# Patient Record
Sex: Female | Born: 1955
Health system: Southern US, Community
[De-identification: ages and names within clinical notes are randomized; demographics above are authoritative.]

## PROBLEM LIST (undated history)

## (undated) DIAGNOSIS — C801 Malignant (primary) neoplasm, unspecified: Secondary | ICD-10-CM

## (undated) DIAGNOSIS — K219 Gastro-esophageal reflux disease without esophagitis: Secondary | ICD-10-CM

## (undated) DIAGNOSIS — I509 Heart failure, unspecified: Secondary | ICD-10-CM

## (undated) DIAGNOSIS — I739 Peripheral vascular disease, unspecified: Secondary | ICD-10-CM

## (undated) DIAGNOSIS — G5600 Carpal tunnel syndrome, unspecified upper limb: Secondary | ICD-10-CM

## (undated) DIAGNOSIS — R011 Cardiac murmur, unspecified: Secondary | ICD-10-CM

## (undated) DIAGNOSIS — C9000 Multiple myeloma not having achieved remission: Secondary | ICD-10-CM

## (undated) DIAGNOSIS — I10 Essential (primary) hypertension: Secondary | ICD-10-CM

## (undated) DIAGNOSIS — M1990 Unspecified osteoarthritis, unspecified site: Secondary | ICD-10-CM

## (undated) DIAGNOSIS — G4730 Sleep apnea, unspecified: Secondary | ICD-10-CM

## (undated) DIAGNOSIS — G629 Polyneuropathy, unspecified: Secondary | ICD-10-CM

## (undated) HISTORY — PX: CERVICAL CONE BIOPSY: SUR198

## (undated) HISTORY — DX: Essential (primary) hypertension: I10

## (undated) HISTORY — PX: ROTATOR CUFF REPAIR: SHX139

## (undated) HISTORY — DX: Multiple myeloma not having achieved remission: C90.00

## (undated) HISTORY — DX: Carpal tunnel syndrome, unspecified upper limb: G56.00

## (undated) HISTORY — PX: OTHER SURGICAL HISTORY: SHX169

## (undated) HISTORY — PX: TUBAL LIGATION: SHX77

---

## 2008-02-25 ENCOUNTER — Encounter: Payer: Self-pay | Admitting: Cardiology

## 2009-04-20 ENCOUNTER — Encounter: Payer: Self-pay | Admitting: Cardiology

## 2009-07-20 ENCOUNTER — Encounter: Payer: Self-pay | Admitting: Cardiology

## 2009-07-25 ENCOUNTER — Encounter: Payer: Self-pay | Admitting: Cardiology

## 2009-07-25 ENCOUNTER — Ambulatory Visit: Payer: Self-pay | Admitting: Cardiology

## 2009-07-27 ENCOUNTER — Encounter (INDEPENDENT_AMBULATORY_CARE_PROVIDER_SITE_OTHER): Payer: Self-pay | Admitting: *Deleted

## 2009-07-27 ENCOUNTER — Ambulatory Visit: Payer: Self-pay | Admitting: Cardiology

## 2009-07-27 DIAGNOSIS — E1165 Type 2 diabetes mellitus with hyperglycemia: Secondary | ICD-10-CM

## 2009-07-27 DIAGNOSIS — E118 Type 2 diabetes mellitus with unspecified complications: Secondary | ICD-10-CM

## 2009-07-27 DIAGNOSIS — R9430 Abnormal result of cardiovascular function study, unspecified: Secondary | ICD-10-CM | POA: Insufficient documentation

## 2009-07-27 DIAGNOSIS — F17200 Nicotine dependence, unspecified, uncomplicated: Secondary | ICD-10-CM

## 2009-07-27 DIAGNOSIS — I10 Essential (primary) hypertension: Secondary | ICD-10-CM

## 2009-07-28 ENCOUNTER — Encounter: Payer: Self-pay | Admitting: Cardiology

## 2009-07-31 ENCOUNTER — Encounter: Payer: Self-pay | Admitting: Cardiology

## 2009-08-02 ENCOUNTER — Encounter: Payer: Self-pay | Admitting: Cardiology

## 2009-08-03 ENCOUNTER — Ambulatory Visit: Payer: Self-pay | Admitting: Cardiovascular Disease

## 2009-08-03 ENCOUNTER — Inpatient Hospital Stay (HOSPITAL_BASED_OUTPATIENT_CLINIC_OR_DEPARTMENT_OTHER): Admission: RE | Admit: 2009-08-03 | Discharge: 2009-08-03 | Payer: Self-pay | Admitting: Cardiovascular Disease

## 2009-08-15 ENCOUNTER — Ambulatory Visit: Payer: Self-pay | Admitting: Cardiology

## 2009-08-15 DIAGNOSIS — R0602 Shortness of breath: Secondary | ICD-10-CM

## 2009-08-15 DIAGNOSIS — I119 Hypertensive heart disease without heart failure: Secondary | ICD-10-CM | POA: Insufficient documentation

## 2009-08-15 DIAGNOSIS — G4733 Obstructive sleep apnea (adult) (pediatric): Secondary | ICD-10-CM

## 2009-08-18 ENCOUNTER — Encounter: Payer: Self-pay | Admitting: Cardiology

## 2009-08-22 ENCOUNTER — Encounter: Payer: Self-pay | Admitting: Cardiology

## 2009-08-23 ENCOUNTER — Ambulatory Visit: Payer: Self-pay | Admitting: Cardiology

## 2009-08-25 ENCOUNTER — Encounter: Payer: Self-pay | Admitting: Cardiology

## 2009-08-29 ENCOUNTER — Encounter: Payer: Self-pay | Admitting: Cardiology

## 2009-09-01 ENCOUNTER — Encounter: Payer: Self-pay | Admitting: Cardiology

## 2009-09-05 ENCOUNTER — Encounter: Payer: Self-pay | Admitting: Cardiology

## 2009-09-13 ENCOUNTER — Ambulatory Visit: Payer: Self-pay | Admitting: Cardiology

## 2009-09-25 ENCOUNTER — Encounter: Payer: Self-pay | Admitting: Cardiology

## 2009-12-07 ENCOUNTER — Encounter: Payer: Self-pay | Admitting: Cardiology

## 2010-03-19 ENCOUNTER — Ambulatory Visit: Payer: Self-pay | Admitting: Cardiology

## 2010-03-22 ENCOUNTER — Encounter: Payer: Self-pay | Admitting: Cardiology

## 2010-07-17 NOTE — Medication Information (Signed)
Summary: RX Folder/ ATENOLOL & LABETALOL  RX Folder/ ATENOLOL & LABETALOL   Imported By: Dorise Hiss 07/31/2009 12:33:41  _____________________________________________________________________  External Attachment:    Type:   Image     Comment:   External Document

## 2010-07-17 NOTE — Letter (Signed)
Summary: Cardiac Cath Instructions - [facility]  [facility] at [facility] [address]. [address]   [place], [place] [address]   Phone: [phone]  Fax: [phone]     07/27/2009 MRN: [id number]  [facility] [place] [address] [place], [place]  [address]  Dear Ms. Farooqui,   You are scheduled for a Cardiac Catheterization on Thursday, August 03, 2009 at 9:30 with Dr. Excell Seltzer.  Please arrive to the 1st floor of the Heart and Vascular Center at [facility] at 8:30am / pm on the day of your procedure. Please do not arrive before 6:30 a.m. Call the Heart and Vascular Center at [phone] if you are unable to make your appointmnet. The Code to get into the parking garage under the building is 0900. Take the elevators to the 1st floor. You must have someone to drive you home. Someone must be with you for the first 24 hours after you arrive home. Please wear clothes that are easy to get on and off and wear slip-on shoes. Do not eat or drink after midnight except water with your medications that morning. Bring all your medications and current insurance cards with you.  _X__ DO NOT take these medications before your procedure:  Humalog, Metformin - hold 24 hours before and 48 hours after cath, and HCTZ  ___ Make sure you take your aspirin.  ___ You may take ALL of your medications with water that morning.  ___ DO NOT take ANY medications before your procedure.  ___ Pre-med instructions:  ________________________________________________________________________  The usual length of stay after your procedure is 2 to 3 hours. This can vary.  If you have any questions, please call the office at the number listed above.  Hoover Brunette, LPN               Directions to the [facility]  Please Note : Park in [place] under the building not the parking deck.  From [employer]: Turn onto [address] Left onto [place] (1st  stoplight) Right at the brick entrance to the hospital ([address]) Bear to the right and you will see a blue sign "[facility]" Parking garage is a sharp right'to get through the gate out in the code _______. Once you park, take the elevator to the first floor. Please do not arrive before 0630am. The building will be dark before that time.   From [address] Turn onto [address] Turn left into the brick entrance to the hospital (Main circle [address]) Bear to the right and you will see a blue sign "[facility]" Parking garage is a sharp right, to get thru the gate put in the code ____. Once you park, take the elevator to the first floor. Please do not arrive before 0630am. The building will be dark before that time

## 2010-07-17 NOTE — Consult Note (Signed)
Summary: CARDIOLOGY CONSULT/ [facility]  CARDIOLOGY CONSULT/ [facility]   Imported By: Zachary George 09/13/2009 09:35:34  _____________________________________________________________________  External Attachment:    Type:   Image     Comment:   External Document

## 2010-07-17 NOTE — Letter (Signed)
Summary: External Correspondence/ FAXED PRE-CATH ORDER  External Correspondence/ FAXED PRE-CATH ORDER   Imported By: Dorise Hiss 08/18/2009 12:19:19  _____________________________________________________________________  External Attachment:    Type:   Image     Comment:   External Document

## 2010-07-17 NOTE — Assessment & Plan Note (Signed)
Summary: 6 MO FU PER SEPT REMINDER   Visit Type:  Follow-up Primary Provider:  Dr. Lonzo Candy   History of Present Illness: the patient is a 55 year old African American female with an abnormal Myoview scan. This was a false-positive study. She has minor nonobstructive coronary artery disease. She does have hypertensive heart disease and was found to have significantly elevated left ventricular end-diastolic pressure a cardiac catheterization. Blood pressure has not been well controlled with a guided approach using plasma renin activity. She does still complain of fatigue and weakness but has been diagnosed with anemia and is on chronic iron supplementation. She denies any chest pain orthopnea PND she has no palpitations or syncope.  Preventive Screening-Counseling & Management  Alcohol-Tobacco     Smoking Status: quit     Year Quit: 08/2009  Current Medications (verified): 1)  Labetalol Hcl 300 Mg Tabs (Labetalol Hcl) .... Take 1 Tablet By Mouth Two Times A Day 2)  Humalog Mix 75/25 75-25 % Susp (Insulin Lispro Prot & Lispro) .... Use As Directed 3)  Metformin Hcl 1000 Mg Tabs (Metformin Hcl) .... Take 1 Tablet By Mouth Two Times A Day 4)  Chlorthalidone 50 Mg Tabs (Chlorthalidone) .... Take 1 Tablet By Mouth Once A Day 5)  Spironolactone 25 Mg Tabs (Spironolactone) .... Take 1 Tablet By Mouth Once A Day 6)  Ferrous Gluconate 324 Mg Tabs (Ferrous Gluconate) .... Take 1 Tablet By Mouth Two Times A Day 7)  Ferrous Sulfate 325 (65 Fe) Mg Tabs (Ferrous Sulfate) .... Take 1 Tablet By Mouth Three Times A Day  Allergies (verified): No Known Drug Allergies  Comments:  Nurse/Medical Assistant: The patient's medication bottles and allergies were reviewed with the patient and were updated in the Medication and Allergy Lists.  Past History:  Past Surgical History: Last updated: August 24, 2009 Rt. Shoulder lipoma removal 2001 Rt. Shoulder rotator cuff repair cone Biopsy of cervical  lesion BTL 1982  Family History: Last updated: 2009/08/24 Father: deceased DM died from MI age 12 Mother:alive DM, CANB 2010 age 65 siblings: alive DM, sister with heart diease  Social History: Last updated: 24-Aug-2009 No smoking quit 2006 18 pack year history No alcohol No drug use Single  has three children and 7 grandchildren Regular Exercise - yes  Risk Factors: Smoking Status: quit (03/19/2010) Packs/Day: 1/4 PPD (Aug 24, 2009)  Past Medical History: Diabetes Type 2 Hypertension controlled using a guided approach with plasma renin activity CARPEL TUNNEL syndrome status post cardiac catheterization nonobstructive coronary artery disease 2010 Normal LV systolic function Hypertensive heart disease with markedly elevated LVEDP by catheterization in2010  Review of Systems       The patient complains of fatigue and muscle weakness.  The patient denies malaise, fever, weight gain/loss, vision loss, decreased hearing, hoarseness, chest pain, palpitations, shortness of breath, prolonged cough, wheezing, sleep apnea, coughing up blood, abdominal pain, blood in stool, nausea, vomiting, diarrhea, heartburn, incontinence, blood in urine, joint pain, leg swelling, rash, skin lesions, headache, fainting, dizziness, depression, anxiety, enlarged lymph nodes, easy bruising or bleeding, and environmental allergies.    Vital Signs:  Patient profile:   55 year old female Height:      69 inches Weight:      255 pounds Pulse rate:   77 / minute BP sitting:   109 / 77  (left arm) Cuff size:   large  Vitals Entered By: Carlye Grippe (March 19, 2010 10:11 AM)  Physical Exam  Additional Exam:  General: obese African American American female in  no acute distress head: Normocephalic and atraumatic eyes PERRLA/EOMI intact, conjunctiva and lids normal nose: No deformity or lesions mouth normal dentition, normal posterior pharynx neck: Supple, no JVD.  No masses, thyromegaly or abnormal  cervical nodes. No carotid bruits lungs: Normal breath sounds bilaterally without wheezing.  Normal percussion heart: regular rate and rhythm with normal S1 and S2, no S3 or S4.  PMI is normal.  No pathological murmurs abdomen: Normal bowel sounds, abdomen is soft and nontender without masses, organomegaly or hernias noted.  No hepatosplenomegaly musculoskeletal: Back normal, normal gait muscle strength and tone normal pulsus: Pulse is normal in all 4 extremities Extremities: No peripheral pitting edema neurologic: Alert and oriented x 3 skin: Intact without lesions or rashes cervical nodes: No significant adenopathy psychologic: Normal affect    Impression & Recommendations:  Problem # 1:  BEN HTN HEART DISEASE WITHOUT HEART FAIL (ICD-402.10) no evidence of volume overload. Continue current medical regimen.history of diastolic heart failure with normal LV systolic function Her updated medication list for this problem includes:    Labetalol Hcl 300 Mg Tabs (Labetalol hcl) .Marland Kitchen... Take 1 tablet by mouth two times a day    Chlorthalidone 50 Mg Tabs (Chlorthalidone) .Marland Kitchen... Take 1 tablet by mouth once a day    Spironolactone 25 Mg Tabs (Spironolactone) .Marland Kitchen... Take 1 tablet by mouth once a day  Problem # 2:  ANEMIA (ICD-285.9) followed by her primary care physician.  Problem # 3:  SHORTNESS OF BREATH (ICD-786.05) I explained to the patient that her dyspnea is likely related to her anemia. Although she is diastolic dysfunction she does not appear to be volume overloaded and her blood pressure is well controlled. Her updated medication list for this problem includes:    Labetalol Hcl 300 Mg Tabs (Labetalol hcl) .Marland Kitchen... Take 1 tablet by mouth two times a day    Chlorthalidone 50 Mg Tabs (Chlorthalidone) .Marland Kitchen... Take 1 tablet by mouth once a day    Spironolactone 25 Mg Tabs (Spironolactone) .Marland Kitchen... Take 1 tablet by mouth once a day  Patient Instructions: 1)  Your physician recommends that you continue  on your current medications as directed. Please refer to the Current Medication list given to you today. 2)  Follow up in  1 year

## 2010-07-17 NOTE — Assessment & Plan Note (Signed)
Summary: post hosp.  --agh   Visit Type:  Follow-up Primary Provider:  Dr. Lonzo Candy  CC:  follow-up visit.  History of Present Illness: the patient is a 55 year old African American female initially seen for chest pain abnormal Myoview scan. The patient underwent cardiac catheterization and was found to have minor nonobstructive coronary arteries with normal left ventricle systolic function with markedly elevated left ventricle end-diastolic pressure consistent with hypertensive heart disease. Basal plasma renin activity the patient was switched to a regimen of chlorthalidone, spironolactone labetalol. The patient's blood pressure is much improved. She is now normotensive. However she feels tired all the time and complains of some generalized weakness. She denies any dizziness or presyncope however. She states that she feels like sleeping all the time. She also feels like she has no energy. She likely has obstructive sleep apnea and night watch monitor was done and we are awaiting the results  Clinical Review Panels:  Cardiac Imaging Cardiac Cath Findings minor nonobstructive coronary artery disease. Normal left ventricle systolic function Hypertensive heart disease with elevated left ventricle end-diastolic pressure at 33 mm of mercury. (08/03/2009)    Preventive Screening-Counseling & Management  Alcohol-Tobacco     Smoking Status: quit     Year Quit: <3wks  Current Medications (verified): 1)  Labetalol Hcl 300 Mg Tabs (Labetalol Hcl) .... Take 1 Tablet By Mouth Two Times A Day 2)  Humalog Mix 75/25 75-25 % Susp (Insulin Lispro Prot & Lispro) .... Use As Directed 3)  Metformin Hcl 1000 Mg Tabs (Metformin Hcl) .... Take 1 Tablet By Mouth Two Times A Day 4)  Chlorthalidone 50 Mg Tabs (Chlorthalidone) .... Take 1 Tablet By Mouth Once A Day 5)  Spironolactone 25 Mg Tabs (Spironolactone) .... Take 1 Tablet By Mouth Once A Day 6)  Ferrous Gluconate 324 Mg Tabs (Ferrous Gluconate)  .... Take 1 Tablet By Mouth Two Times A Day  Allergies (verified): No Known Drug Allergies  Comments:  Nurse/Medical Assistant: The patient's medications and allergies were reviewed with the patient and were updated in the Medication and Allergy Lists. Bottles reviewed.  Past History:  Past Medical History: Last updated: Aug 09, 2009 Diabetes Type 2 Hypertension CARPEL TUNNEL syndrome  Past Surgical History: Last updated: 08-09-09 Rt. Shoulder lipoma removal 2001 Rt. Shoulder rotator cuff repair cone Biopsy of cervical lesion BTL 1982  Family History: Last updated: 08-09-09 Father: deceased DM died from MI age 62 Mother:alive DM, CANB 2010 age 106 siblings: alive DM, sister with heart diease  Social History: Last updated: 08/09/09 No smoking quit 2006 18 pack year history No alcohol No drug use Single  has three children and 7 grandchildren Regular Exercise - yes  Risk Factors: Exercise: yes (2009/08/09)  Risk Factors: Smoking Status: quit (09/13/2009) Packs/Day: 1/4 PPD (08-09-2009)  Review of Systems       The patient complains of fatigue and sleep apnea.  The patient denies malaise, fever, weight gain/loss, vision loss, hoarseness, chest pain, palpitations, prolonged cough, wheezing, coughing up blood, abdominal pain, blood in stool, vomiting, heartburn, joint pain, leg swelling, rash, headache, fainting, dizziness, depression, easy bruising or bleeding, and environmental allergies.    Vital Signs:  Patient profile:   56 year old female Height:      69 inches Weight:      275 pounds Pulse rate:   87 / minute BP sitting:   122 / 81  (left arm) Cuff size:   large  Vitals Entered By: Carlye Grippe (September 13, 2009 9:39 AM) CC:  follow-up visit   Physical Exam  Additional Exam:  General: obese African American American female in no acute distress head: Normocephalic and atraumatic eyes PERRLA/EOMI intact, conjunctiva and lids normal nose: No  deformity or lesions mouth normal dentition, normal posterior pharynx neck: Supple, no JVD.  No masses, thyromegaly or abnormal cervical nodes. No carotid bruits lungs: Normal breath sounds bilaterally without wheezing.  Normal percussion heart: regular rate and rhythm with normal S1 and S2, no S3 or S4.  PMI is normal.  No pathological murmurs abdomen: Normal bowel sounds, abdomen is soft and nontender without masses, organomegaly or hernias noted.  No hepatosplenomegaly musculoskeletal: Back normal, normal gait muscle strength and tone normal pulsus: Pulse is normal in all 4 extremities Extremities: No peripheral pitting edema neurologic: Alert and oriented x 3 skin: Intact without lesions or rashes cervical nodes: No significant adenopathy psychologic: Normal affect    Impression & Recommendations:  Problem # 1:  BEN HTN HEART DISEASE WITHOUT HEART FAIL (ICD-402.10) blood pressure under very good control but the patient is complaining of weakness and lethargy. I suspect that we may have lowered her blood pressure pills to much in a short period of time. I asked her to decrease labetalol to 300 mg p.o. b.i.d. follow blood work will also be done in one week as well as a magnesium level. The following medications were removed from the medication list:    Diltiazem Hcl Er Beads 300 Mg Xr24h-cap (Diltiazem hcl er beads) .Marland Kitchen... Take 1 tablet by mouth once a day    Enalapril Maleate 20 Mg Tabs (Enalapril maleate) .Marland Kitchen... Take 1 tablet by mouth two times a day Her updated medication list for this problem includes:    Labetalol Hcl 300 Mg Tabs (Labetalol hcl) .Marland Kitchen... Take 1 tablet by mouth two times a day    Chlorthalidone 50 Mg Tabs (Chlorthalidone) .Marland Kitchen... Take 1 tablet by mouth once a day    Spironolactone 25 Mg Tabs (Spironolactone) .Marland Kitchen... Take 1 tablet by mouth once a day  Future Orders: T-Basic Metabolic Panel [id number]) ... 09/20/2009 T-Magnesium [id number]) ... 09/20/2009  Problem # 2:   OBSTRUCTIVE SLEEP APNEA (ICD-327.23) Night watch monitor was done. Results pending.  Problem # 3:  TOBACCO ABUSE (ICD-305.1) Assessment: Comment Only patient was counseled regarding this. She quit 3 weeks ago.  Problem # 4:  NONSPECIFIC ABNORMAL UNSPEC CV FUNCTION STUDY (ICD-794.30) false-positive Cardiolite study with nonobstructive coronary artery disease by catheterization.  Patient Instructions: 1)  Decrease Labetalol to 300mg  two times a day  2)  Labs:  BMET & Magnesium in one week 3)  Follow up in  3 months

## 2010-07-17 NOTE — Miscellaneous (Signed)
Summary: iron tab update  Clinical Lists Changes  Medications: Removed medication of FERROUS GLUCONATE 324 MG TABS (FERROUS GLUCONATE) Take 1 tablet by mouth two times a day

## 2010-07-17 NOTE — Miscellaneous (Signed)
Summary: Orders Update - Neurology referral  Clinical Lists Changes  Orders: Added new Referral order of Neurology Referral (Neuro) - Signed

## 2010-07-17 NOTE — Miscellaneous (Signed)
Summary: Orders Update - iron studies, stool cards  Clinical Lists Changes  Orders: Added new Test order of T-Iron [phone]) - Signed Added new Test order of T-Ferritin [phone]) - Signed Added new Test order of T-Hemoccult Cards-Multiple ([id number]) - Signed Added new Test order of T- * Misc. Laboratory test [phone]) - Signed

## 2010-07-17 NOTE — Letter (Signed)
Summary: [facility]   Imported By: Zachary George 07/27/2009 12:10:46  _____________________________________________________________________  External Attachment:    Type:   Image     Comment:   External Document

## 2010-07-17 NOTE — Letter (Signed)
Summary: External Correspondence/ REFERRAL REQUEST [facility]  External Correspondence/ REFERRAL REQUEST [facility]   Imported By: Dorise Hiss 10/03/2009 16:16:18  _____________________________________________________________________  External Attachment:    Type:   Image     Comment:   External Document

## 2010-07-17 NOTE — Letter (Signed)
Summary: Appointment -missed  [facility] at [facility] [address]   [place], [place] [address]   Phone: [phone]  Fax: [phone]     September 01, 2009 MRN: [id number]      [facility] [place] [address] [place], [place]  [address]      Dear Ms. Doucet,  Our records indicate you missed your appointment on September 01, 2009                        with blood pressuer check.   It is very important that we reach you to reschedule this appointment. We look forward to participating in your health care needs.   Please contact us at the number listed above at your earliest convenience to reschedule this appointment.   Sincerely,    Glass blower/designer

## 2010-07-17 NOTE — Miscellaneous (Signed)
Summary: update med list  Clinical Lists Changes  Medications: Changed medication from LABETALOL HCL 200 MG TABS (LABETALOL HCL) take 2 tabs (400mg ) two times a day (place on file) to LABETALOL HCL 300 MG TABS (LABETALOL HCL) 2 tabs (600mg ) two times a day Added new medication of SPIRONOLACTONE 25 MG TABS (SPIRONOLACTONE) Take 1 tablet by mouth once a day

## 2010-07-17 NOTE — Miscellaneous (Signed)
Summary: iron med update  Clinical Lists Changes     Phone call to patient, Dr. Loney Hering is currently managing anemia. Last seen in September and is due to see him again on 10/28.  PMD has checked  labs since 4/11 and remains on Iron 325 three times a day. Hoover Brunette, LPN  March 22, 2010 5:36 PM

## 2010-07-17 NOTE — Assessment & Plan Note (Signed)
Summary: NP-ABNORMAL SHOWING SOME FILLING DEFEATS   Visit Type:  Initial Consult Primary Provider:  Dr. Lonzo Candy  CC:  new patient and abnormal stress test.  History of Present Illness: the patient is a 55 year old female with multiple cardiac risk factors including hypertension, diabetes mellitus and a family history of coronary artery disease. The patient's mother at age 78 underwent recent coronary bypass grafting.the patient also smokes tobacco.  The patient has been referred she was evaluated in the emergency room for chest pain. Cardiac enzymes were negative. The patient however reports symptoms of substernal chest pain associated with shortness of breath and pain radiating to left shoulder left arm. Particularly walking and stress will cause her to be symptomatic. She denies any palpitations or syncope. The patient was referred for stress test. She was found to have abnormal perfusion. There was a significant inferolateral defect consistent with ischemia. The ejection fraction was 40% with global hypokinesis. End-diastolic volume is increased at 221 mL. Differential diagnoses includes ischemic heart disease or potential hypertensive cardiomyopathy. The patient states that her whole life she had severe hypertension with poor control. The patient is on several antihypertensive medications if her blood pressure remains elevated at 180/104.    Preventive Screening-Counseling & Management  Alcohol-Tobacco     Smoking Status: current     Smoking Cessation Counseling: yes     Packs/Day: 1/4 PPD   Current Medications (verified): 1)  Labetalol Hcl 200 Mg Tabs (Labetalol Hcl) .... Take 1 Tablet By Mouth Two Times A Day 2)  Diltiazem Hcl Er Beads 300 Mg Xr24h-Cap (Diltiazem Hcl Er Beads) .... Take 1 Tablet By Mouth Once A Day 3)  Humalog Mix 75/25 75-25 % Susp (Insulin Lispro Prot & Lispro) .... Use As Directed 4)  Metformin Hcl 1000 Mg Tabs (Metformin Hcl) .... Take 1 Tablet By Mouth Two  Times A Day 5)  Enalapril Maleate 20 Mg Tabs (Enalapril Maleate) .... Take 1 Tablet By Mouth Two Times A Day 6)  Hydrochlorothiazide 25 Mg Tabs (Hydrochlorothiazide) .... Take 1 Tablet By Mouth Once A Day 7)  Diclofenac Potassium 50 Mg Tabs (Diclofenac Potassium) .... Take 1 Tablet By Mouth Two Times A Day 8)  Cyclobenzaprine Hcl 10 Mg Tabs (Cyclobenzaprine Hcl) .... Take 1 Tablet By Mouth Once A Day At Bedtime  Allergies (verified): No Known Drug Allergies  Comments:  Nurse/Medical Assistant: The patient's medications and allergies were reviewed with the patient and were updated in the Medication and Allergy Lists. List reviewed.  Past History:  Past Medical History: Last updated: 08/20/09 Diabetes Type 2 Hypertension CARPEL TUNNEL syndrome  Past Surgical History: Last updated: August 20, 2009 Rt. Shoulder lipoma removal 2001 Rt. Shoulder rotator cuff repair cone Biopsy of cervical lesion BTL 1982  Family History: Last updated: 2009/08/20 Father: deceased DM died from MI age 23 Mother:alive DM, CANB 2010 age 46 siblings: alive DM, sister with heart diease  Social History: Last updated: 2009/08/20 No smoking quit 2006 18 pack year history No alcohol No drug use Single  has three children and 7 grandchildren Regular Exercise - yes  Risk Factors: Smoking Status: current (08/20/2009) Packs/Day: 1/4 PPD (2009-08-20)  Social History: Smoking Status:  current Packs/Day:  1/4 PPD  Review of Systems       The patient complains of chest pain and shortness of breath.  The patient denies fatigue, malaise, fever, weight gain/loss, vision loss, decreased hearing, hoarseness, palpitations, prolonged cough, wheezing, sleep apnea, coughing up blood, abdominal pain, blood in stool, nausea, vomiting, diarrhea, heartburn,  incontinence, blood in urine, muscle weakness, joint pain, leg swelling, rash, skin lesions, headache, fainting, dizziness, depression, anxiety, enlarged lymph nodes,  easy bruising or bleeding, and environmental allergies.    Vital Signs:  Patient profile:   55 year old female Height:      69 inches Weight:      279 pounds BMI:     41.35 Pulse rate:   88 / minute BP sitting:   180 / 104  (left arm) Cuff size:   large  Vitals Entered By: Carlye Grippe (July 27, 2009 2:46 PM)  Nutrition Counseling: Patient's BMI is greater than 25 and therefore counseled on weight management options.  Serial Vital Signs/Assessments:  Time      Position  BP       Pulse  Resp  Temp     By 2:52 PM             173/99   84                    Carlye Grippe  CC: new patient,abnormal stress test   Physical Exam  Additional Exam:  General: obese African American American female in no acute distress head: Normocephalic and atraumatic eyes PERRLA/EOMI intact, conjunctiva and lids normal nose: No deformity or lesions mouth normal dentition, normal posterior pharynx neck: Supple, no JVD.  No masses, thyromegaly or abnormal cervical nodes. No carotid bruits lungs: Normal breath sounds bilaterally without wheezing.  Normal percussion heart: regular rate and rhythm with normal S1 and S2, no S3 or S4.  PMI is normal.  No pathological murmurs abdomen: Normal bowel sounds, abdomen is soft and nontender without masses, organomegaly or hernias noted.  No hepatosplenomegaly musculoskeletal: Back normal, normal gait muscle strength and tone normal pulsus: Pulse is normal in all 4 extremities Extremities: No peripheral pitting edema neurologic: Alert and oriented x 3 skin: Intact without lesions or rashes cervical nodes: No significant adenopathy psychologic: Normal affect    Impression & Recommendations:  Problem # 1:  NONSPECIFIC ABNORMAL UNSPEC CV FUNCTION STUDY (ICD-794.30) the patient's pretest probability for coronary artery disease high. She reports symptoms are concerning for angina. She has an abnormal stress test. I have recommended a cardiac catheterization  to the patient. I discussed the risk and benefits. The patient is understanding and wants to proceed with a cardiac catheterization. Orders: T-Basic Metabolic Panel [id number]) T-CBC No Diff ([id number]) T-Protime, Auto ([id number]) T-PTT ([id number]) T-Chest x-ray, 2 views (87564) Cardiac Catheterization (Cardiac Cath)  Problem # 2:  HYPERTENSION (ICD-401.9) hypertension extremely poorly controlled. I changed the atenolol to labetalol 200 mg p.o. b.i.d. The latter medication can be further increased. If she continues to have poor blood pressure control, determination of a plasma renin activity may be useful to see if an alpha blocker could provide additional benefit in her antihypertensive medical regimen. Her updated medication list for this problem includes:    Labetalol Hcl 200 Mg Tabs (Labetalol hcl) .Marland Kitchen... Take 1 tablet by mouth two times a day    Diltiazem Hcl Er Beads 300 Mg Xr24h-cap (Diltiazem hcl er beads) .Marland Kitchen... Take 1 tablet by mouth once a day    Enalapril Maleate 20 Mg Tabs (Enalapril maleate) .Marland Kitchen... Take 1 tablet by mouth two times a day    Hydrochlorothiazide 25 Mg Tabs (Hydrochlorothiazide) .Marland Kitchen... Take 1 tablet by mouth once a day  Orders: T-Basic Metabolic Panel [id number]) T-CBC No Diff ([id number]) T-Protime, Auto ([id number]) T-PTT ([id number])  Problem # 3:  TOBACCO ABUSE (ICD-305.1)  I counseled the patient extensively regarding her tobacco abuse. I offered her some solutions. The patient will consider these.  Patient Instructions: 1)  Left JV Cath  2)  Stop Atenolol 3)  Start Labetalol 200mg  two times a day  4)  Follow up will be scheduled at discharge from cath.   Prescriptions: LABETALOL HCL 200 MG TABS (LABETALOL HCL) Take 1 tablet by mouth two times a day  #60 x 6   Entered by:   Hoover Brunette, LPN   Authorized by:   Lewayne Bunting, MD, [facility]   Signed by:   Hoover Brunette, LPN on 25/42/7062   Method used:   [facility]  [address] [address]  [id number]       [address]       [place], [place]  [address]       Ph: [phone] or [phone]       Fax: [phone]   RxID:   [id number]   Handout requested.

## 2010-07-17 NOTE — Medication Information (Signed)
Summary: RX Folder/ FAXED SILVERSCRIPT  RX Folder/ FAXED SILVERSCRIPT   Imported By: Dorise Hiss 12/07/2009 16:47:31  _____________________________________________________________________  External Attachment:    Type:   Image     Comment:   External Document

## 2010-07-17 NOTE — Assessment & Plan Note (Signed)
Summary: POST CATH F/U PER JOAN-JM   Visit Type:  Follow-up Primary Provider:  Dr. Lonzo Candy  CC:  follow-up visit.  History of Present Illness: the patient is a 55 year old female, African American with chest pain and an abnormal Myoview scan. The patient is hypertensive heart disease. She underwent a cardiac catheterization because of high pretest mobility of coronary artery disease. The patient had minor nonobstructive coronary artery disease with normal left ventricle systolic function but markedly elevated left ventricular end-diastolic pressure consistent with hypertensive heart disease.  The patient's today again hasn't markedly elevated blood pressure 181/94 and this despite multiple antihypertensive medications. The patient also has acquired a blood pressure cuff for home monitoring. She states on minimal exertion her systolic blood pressure was well above 200 mm of mercury. She has exertional shortness of breath. She denies any popping or PND. She does report loud snoring at night. the patient's Epsworth sleeping score was 2, but she has multiple risk factors for OSA.  The patient also had blood work done which showed a mild anemia possibly consistent with iron deficiency anemia.  Preventive Screening-Counseling & Management  Alcohol-Tobacco     Smoking Status: quit     Year Quit: 08/03/2009  Current Problems (verified): 1)  Ben Htn Heart Disease Without Heart Fail  (ICD-402.10) 2)  Anemia  (ICD-285.9) 3)  Obstructive Sleep Apnea  (ICD-327.23) 4)  Shortness of Breath  (ICD-786.05) 5)  Tobacco Abuse  (ICD-305.1) 6)  Nonspecific Abnormal Unspec Cv Function Study  (ICD-794.30) 7)  Hypertension  (ICD-401.9) 8)  Encounter For Long-term Use of Other Medications  (ICD-V58.69) 9)  Dm  (ICD-250.00)  Current Medications (verified): 1)  Labetalol Hcl 200 Mg Tabs (Labetalol Hcl) .... Take 2 Tabs (400mg ) Two Times A Day (Place On File) 2)  Diltiazem Hcl Er Beads 300 Mg Xr24h-Cap  (Diltiazem Hcl Er Beads) .... Take 1 Tablet By Mouth Once A Day 3)  Humalog Mix 75/25 75-25 % Susp (Insulin Lispro Prot & Lispro) .... Use As Directed 4)  Metformin Hcl 1000 Mg Tabs (Metformin Hcl) .... Take 1 Tablet By Mouth Two Times A Day 5)  Enalapril Maleate 20 Mg Tabs (Enalapril Maleate) .... Take 1 Tablet By Mouth Two Times A Day 6)  Diclofenac Potassium 50 Mg Tabs (Diclofenac Potassium) .... Take 1 Tablet By Mouth Two Times A Day 7)  Cyclobenzaprine Hcl 10 Mg Tabs (Cyclobenzaprine Hcl) .... Take 1 Tablet By Mouth Once A Day At Bedtime 8)  Chlorthalidone 50 Mg Tabs (Chlorthalidone) .... Take 1 Tablet By Mouth Once A Day  Allergies (verified): No Known Drug Allergies  Comments:  Nurse/Medical Assistant: The patient's medications and allergies were reviewed with the patient and were updated in the Medication and Allergy Lists. List reviewed.  Past History:  Past Medical History: Last updated: 31-Jul-2009 Diabetes Type 2 Hypertension CARPEL TUNNEL syndrome  Past Surgical History: Last updated: 2009/07/31 Rt. Shoulder lipoma removal 2001 Rt. Shoulder rotator cuff repair cone Biopsy of cervical lesion BTL 1982  Family History: Last updated: Jul 31, 2009 Father: deceased DM died from MI age 20 Mother:alive DM, CANB 2010 age 73 siblings: alive DM, sister with heart diease  Social History: Last updated: Jul 31, 2009 No smoking quit 2006 18 pack year history No alcohol No drug use Single  has three children and 7 grandchildren Regular Exercise - yes  Risk Factors: Exercise: yes (07-31-09)  Risk Factors: Smoking Status: quit (08/15/2009) Packs/Day: 1/4 PPD (2009-07-31)  Social History: Smoking Status:  quit  Review of Systems  The patient complains of weight gain/loss, shortness of breath, and sleep apnea.  The patient denies fatigue, malaise, fever, vision loss, decreased hearing, hoarseness, chest pain, palpitations, prolonged cough, wheezing, coughing up  blood, abdominal pain, blood in stool, nausea, vomiting, diarrhea, heartburn, incontinence, blood in urine, muscle weakness, joint pain, leg swelling, rash, skin lesions, headache, fainting, dizziness, depression, anxiety, enlarged lymph nodes, easy bruising or bleeding, and environmental allergies.    Vital Signs:  Patient profile:   55 year old female Height:      69 inches Weight:      282 pounds Pulse rate:   84 / minute BP sitting:   181 / 94  (left arm) Cuff size:   large  Vitals Entered By: Carlye Grippe (August 15, 2009 8:43 AM) CC: follow-up visit   Physical Exam  Additional Exam:  General: obese African American American female in no acute distress head: Normocephalic and atraumatic eyes PERRLA/EOMI intact, conjunctiva and lids normal nose: No deformity or lesions mouth normal dentition, normal posterior pharynx neck: Supple, no JVD.  No masses, thyromegaly or abnormal cervical nodes. No carotid bruits lungs: Normal breath sounds bilaterally without wheezing.  Normal percussion heart: regular rate and rhythm with normal S1 and S2, no S3 or S4.  PMI is normal.  No pathological murmurs abdomen: Normal bowel sounds, abdomen is soft and nontender without masses, organomegaly or hernias noted.  No hepatosplenomegaly musculoskeletal: Back normal, normal gait muscle strength and tone normal pulsus: Pulse is normal in all 4 extremities Extremities: No peripheral pitting edema neurologic: Alert and oriented x 3 skin: Intact without lesions or rashes cervical nodes: No significant adenopathy psychologic: Normal affect    Clinical Review Panels:  CXR CXR results PORTABLE CHEST - 1 VIEW                             Comparison: None                   Findings: The cardiomediastinal silhouette is moderately enlarged         with a globular appearance.  Clear lungs.  Normal pulmonary         vascularity.  No pneumothorax.  No pleural effusion.                   IMPRESSION:          Cardiomegaly.  The cardiac silhouette has a globular appearance.         Pericardial effusion is not excluded.                                                  Reported By: Donavan Burnet, MD           (04/20/2010)    Cardiac Cath  Procedure date:  08/03/2009  Findings:      minor nonobstructive coronary artery disease. Normal left ventricle systolic function Hypertensive heart disease with elevated left ventricle end-diastolic pressure at 33 mm of mercury.  Impression & Recommendations:  Problem # 1:  BEN HTN HEART DISEASE WITHOUT HEART FAIL (ICD-402.10) the patient has severe hypertensive heart disease. Her left ventricle end-diastolic pressure was markedly elevated during the catheterization. She also has exertional hypertensive blood pressure response. I discussed with the patient to make major medication changes  today. We increase her labetalol to 300 mg p.o. b.i.d. which can be further increased to 400 monos p.o. b.i.d. and one week from now. We will also discontinue hydrochlorothiazide and change to chlorthalidone 50 mg p.o. q. daily. I also asked the patient to be very careful with the use of diclofenac which will contribute to her hypertension. I asked her to take p.r.n. Tylenol if needed. In one week Will also check a BMET and a plasma renin activity to further adjust the patient's antihypertensive drug regimen. The following medications were removed from the medication list:    Hydrochlorothiazide 25 Mg Tabs (Hydrochlorothiazide) .Marland Kitchen... Take 1 tablet by mouth once a day Her updated medication list for this problem includes:    Labetalol Hcl 200 Mg Tabs (Labetalol hcl) .Marland Kitchen... Take 2 tabs (400mg ) two times a day (place on file)    Diltiazem Hcl Er Beads 300 Mg Xr24h-cap (Diltiazem hcl er beads) .Marland Kitchen... Take 1 tablet by mouth once a day    Enalapril Maleate 20 Mg Tabs (Enalapril maleate) .Marland Kitchen... Take 1 tablet by mouth two times a day    Chlorthalidone 50 Mg Tabs (Chlorthalidone)  .Marland Kitchen... Take 1 tablet by mouth once a day  Problem # 2:  ANEMIA (ICD-285.9) the patient has a mild anemia of unclear etiology. She could have thalassemia but we will screen her for iron deficiency anemia and check guaiac stools.  Problem # 3:  OBSTRUCTIVE SLEEP APNEA (ICD-327.23) although the patient scores low on the Epsworth scale she still has features that are very concerning for sleep apnea. The patient also herself wanted to be tested for obstructive sleep apnea which if positive could be a major contributor to hypertension. I ordered a Nightwatch monitor for the patient Orders: Misc. Referral (Misc. Ref)  Problem # 4:  TOBACCO ABUSE (ICD-305.1) the patient discontinued tobacco use.  Problem # 5:  DM (ICD-250.00) Assessment: Comment Only  Her updated medication list for this problem includes:    Humalog Mix 75/25 75-25 % Susp (Insulin lispro prot & lispro) ..... Use as directed    Metformin Hcl 1000 Mg Tabs (Metformin hcl) .Marland Kitchen... Take 1 tablet by mouth two times a day    Enalapril Maleate 20 Mg Tabs (Enalapril maleate) .Marland Kitchen... Take 1 tablet by mouth two times a day  Other Orders: Future Orders: T-Basic Metabolic Panel [id number]) ... 08/22/2009 T- * Misc. Laboratory test [id number]) ... 08/22/2009  Patient Instructions: 1)  Increase Labetalol to 300mg  two times a day  2)  Increase to 400mg  after one week.   3)  Stop HCTZ 4)  Start Chlorthalidone 50mg  daily 5)  Labs in one week.   6)  Nightwatch 7)  Follow up in  6 months. Prescriptions: CHLORTHALIDONE 50 MG TABS (CHLORTHALIDONE) Take 1 tablet by mouth once a day  #30 x 6   Entered by:   Hoover Brunette, LPN   Authorized by:   Lewayne Bunting, MD, [facility]   Signed by:   Hoover Brunette, LPN on 28/41/3244   Method used:   [facility]  [address] [id number]       [address]       [place], [place]  [address]       Ph: [phone] or [phone]       Fax: [phone]   RxID:   [id number]    Handout requested. LABETALOL HCL 200 MG TABS (LABETALOL  HCL) take 2 tabs (400mg ) two times a day (place on file)  #120 x 6   Entered by:   Hoover Brunette, LPN   Authorized by:   Lewayne Bunting, MD, [facility]   Signed by:   Hoover Brunette, LPN on 44/06/270   Method used:   [facility]  [address] [id number]       [address]       [place], [place]  [address]       Ph: [phone] or [phone]       Fax: [phone]   RxID:   [id number]

## 2010-09-06 LAB — POCT I-STAT GLUCOSE: Glucose, Bld: 109 mg/dL — ABNORMAL HIGH (ref 70–99)

## 2010-10-18 ENCOUNTER — Other Ambulatory Visit: Payer: Self-pay | Admitting: *Deleted

## 2010-10-18 MED ORDER — LABETALOL HCL 200 MG PO TABS: 400.0000 mg | ORAL_TABLET | Freq: Two times a day (BID) | ORAL | Status: DC

## 2011-08-06 ENCOUNTER — Telehealth: Payer: Self-pay | Admitting: Cardiology

## 2011-08-06 NOTE — Telephone Encounter (Signed)
02/16/11 & 05/15/11 & 08/06/11 reminder mailed to schedule f/u-srs °

## 2011-12-24 ENCOUNTER — Encounter: Payer: Self-pay | Admitting: Cardiology

## 2012-08-06 ENCOUNTER — Other Ambulatory Visit (HOSPITAL_COMMUNITY): Payer: Self-pay | Admitting: Oral Surgery

## 2012-08-18 ENCOUNTER — Encounter (HOSPITAL_COMMUNITY)
Admission: RE | Admit: 2012-08-18 | Discharge: 2012-08-18 | Disposition: A | Discharge status: Home / Self Care | Payer: Medicare Other | Source: Ambulatory Visit | Attending: Oral Surgery | Admitting: Oral Surgery

## 2012-08-18 ENCOUNTER — Encounter (HOSPITAL_COMMUNITY)
Admission: RE | Admit: 2012-08-18 | Discharge: 2012-08-18 | Disposition: A | Discharge status: Home / Self Care | Payer: Medicare Other | Source: Ambulatory Visit | Attending: Anesthesiology | Admitting: Anesthesiology

## 2012-08-18 ENCOUNTER — Encounter (HOSPITAL_COMMUNITY): Payer: Self-pay

## 2012-08-18 HISTORY — DX: Heart failure, unspecified: I50.9

## 2012-08-18 HISTORY — DX: Peripheral vascular disease, unspecified: I73.9

## 2012-08-18 HISTORY — DX: Polyneuropathy, unspecified: G62.9

## 2012-08-18 HISTORY — DX: Cardiac murmur, unspecified: R01.1

## 2012-08-18 HISTORY — DX: Unspecified osteoarthritis, unspecified site: M19.90

## 2012-08-18 LAB — CBC
HCT: 39.9 % (ref 36.0–46.0)
Hemoglobin: 13.7 g/dL (ref 12.0–15.0)
MCH: 31 pg (ref 26.0–34.0)
MCHC: 34.3 g/dL (ref 30.0–36.0)
MCV: 90.3 fL (ref 78.0–100.0)
Platelets: 274 K/uL (ref 150–400)
RBC: 4.42 MIL/uL (ref 3.87–5.11)
RDW: 12.8 % (ref 11.5–15.5)
WBC: 7 K/uL (ref 4.0–10.5)

## 2012-08-18 LAB — BASIC METABOLIC PANEL WITH GFR
BUN: 12 mg/dL (ref 6–23)
CO2: 28 meq/L (ref 19–32)
Calcium: 10.4 mg/dL (ref 8.4–10.5)
Chloride: 95 meq/L — ABNORMAL LOW (ref 96–112)
Creatinine, Ser: 0.82 mg/dL (ref 0.50–1.10)
GFR calc Af Amer: >90 mL/min
GFR calc non Af Amer: 80 mL/min — ABNORMAL LOW
Glucose, Bld: 405 mg/dL — ABNORMAL HIGH (ref 70–99)
Potassium: 3.5 meq/L (ref 3.5–5.1)
Sodium: 134 meq/L — ABNORMAL LOW (ref 135–145)

## 2012-08-18 NOTE — Progress Notes (Signed)
Anesthesia chart review: Patient is a 35 female scheduled for multiple teeth extraction, alveoloplasty by Dr. Barbette Garrett on 08/24/12. History includes diabetes mellitus type 2 with neuropathy, obesity, HTN, former smoker, PAD (not specified), CHF 08/2009, murmur (benign per patient account), arthritis.  She had an inconclusive sleep study in 2011.  She was seen by Cardiologist Dr. Andee Garrett in 2011 following an abnormal Myoview scan that was found to be false-positive.  Cath on 08/03/09 showed minor nonobstructive CAD (minor irregularities of the proximal LAD, 30-40% proximal RCA, and 20-30% mid RCA), normal left ventricular systolic function with EF 50-55%, hypertensive heart disease with elevated left ventricular end-diastolic pressure. Medical therapy for hypertension and continue risk reduction was recommended.   EKG on 08/18/12 showed NSR, moderate voltage criteria for LVH. Chest x-ray on 08/18/2012 showed no acute abnormalities.  Preoperative labs noted.  Her non-fasting glucose was 405.  I called and spoke with Christine Garrett this afternoon.  She reports she did not take her insulin this morning because she was unsure if she could before her PAT labs.  (She did not eat a full meal, but did have a store-bought coffee.)  Reportedly, her fasting glucose is ~ 130's.  She is aware that she can take her DM meds as usual on 08/23/12, but should take a bedtime snack since she will be NPO after MN on 08/24/12.  She will hold her DM meds on the morning of surgery and is due to arrive at 0530.  Her PCP Dr. Loney Garrett manages her DM.  I encouraged her to communicate with him if her glucose readings remained elevated and to give Dr. Barbette Garrett an update at her appointment with him on 08/20/12.  I also left a voicemail with Christine Garrett at Dr. Randa Garrett office regarding patient's elevated glucose readings.    She denies chest pain, SOB.  She can do her own shopping without difficultly.  She feels at her baseline although she has been having mouth  pain.  At her last dental visit, she was told that she did not have an abscess.  If her glucose reading on the day of surgery is reasonable then would anticipate she could proceed as planned.  Christine Garrett [facility] Short Stay Center/Anesthesiology Phone [phone] 08/18/2012 4:10 PM

## 2012-08-18 NOTE — Pre-Procedure Instructions (Addendum)
Christine Garrett  08/18/2012  Your procedure is scheduled on:  Monday, March 10th.  Report to [facility] at 5:30AM.  Call this number if you have problems the morning of surgery: [phone]   Remember:   Do not eat food or drink liquids after midnight.    Take these medicines the morning of surgery with A SIP OF WATER: Diltiazem (Cardizem) and Labetalol (Normodyne).  May take Cyclobenzaprine (Flexeril) if needed.  Stop taking Aspirin, Coumadin, Plavix, Effient and Herbal medications.  Do not take any NSAIDs ie: Ibuprofen,  Advil,Naproxen or any medication containing Aspirin.   Do not wear jewelry, make-up or nail polish.  Do not wear lotions, powders, or perfumes. You may wear deodorant.  Do not shave 48 hours prior to surgery.   Do not bring valuables to the hospital.  Contacts, dentures or bridgework may not be worn into surgery.  Leave suitcase in the car. After surgery it may be brought to your room.  For patients admitted to the hospital, checkout time is 11:00 AM the day of discharge.   Patients discharged the day of surgery will not be allowed to drive home.  Name and phone number of your driver: -   Special Instructions: Shower using CHG 2 nights before surgery and the night before surgery.  If you shower the day of surgery use CHG.  Use special wash - you have one bottle of CHG for all showers.  You should use approximately 1/3 of the bottle for each shower.   Please read over the following fact sheets that you were given: Pain Booklet, Coughing and Deep Breathing and Surgical Site Infection Prevention

## 2012-08-20 NOTE — H&P (Signed)
HISTORY AND PHYSICAL  Christine Garrett is a 57 y.o. female patient with CC: Painful teeth  No diagnosis found.  Past Medical History  Diagnosis Date  . Diabetes mellitus     type 2  . Hypertension     controlled using a guided approch with plasma renin activity  . Carpal tunnel syndrome   . CHF (congestive heart failure)   . Peripheral vascular disease   . Heart murmur     "Little, No concerns" per Dr  Andee Lineman pt reported.  . Neuropathy   . Arthritis     knees    No current facility-administered medications for this encounter.   Current Outpatient Prescriptions  Medication Sig Dispense Refill  . chlorthalidone (HYGROTON) 50 MG tablet Take 50 mg by mouth daily.      Marland Kitchen diltiazem (TIAZAC) 300 MG 24 hr capsule Take 300 mg by mouth daily.      . enalapril (VASOTEC) 20 MG tablet Take 20 mg by mouth 2 (two) times daily.       . insulin NPH-insulin regular (NOVOLIN 70/30) (70-30) 100 UNIT/ML injection Inject 86-100 Units into the skin 2 (two) times daily with a meal. 100 units in the morning and 86 units in the evening      . labetalol (NORMODYNE) 200 MG tablet Take 400 mg by mouth 2 (two) times daily.      . metFORMIN (GLUCOPHAGE) 1000 MG tablet Take 1,000 mg by mouth 2 (two) times daily with a meal.      . Multiple Vitamin (MULTIVITAMIN WITH MINERALS) TABS Take 1 tablet by mouth daily.      . naproxen (NAPROSYN) 500 MG tablet Take 500 mg by mouth 2 (two) times daily with a meal.       No Known Allergies Active Problems:   * No active hospital problems. *  Vitals: There were no vitals taken for this visit. Lab results:No results found for this or any previous visit (from the past 24 hour(s)). Radiology Results: Dg Chest 2 View  08/18/2012  *RADIOLOGY REPORT*  Clinical Data: Preoperative assessment for tooth extraction, history hypertension, CHF, diabetes, heart murmur, PVCs  CHEST - 2 VIEW  Comparison: 08/23/2009  Findings: Normal heart size and pulmonary vascularity. Mildly  tortuous thoracic aorta. Lungs clear. No pleural effusion or pneumothorax. Bones unremarkable.  IMPRESSION: No acute abnormalities.   Original Report Authenticated By: Ulyses Southward, M.D.    Panoramic radiograph demonstrates mixed radiolucent/radioopaque lesions of the right and left mandible. Presumably Condensing osteitis. General appearance: alert, cooperative, no distress and moderately obese Head: Normocephalic, without obvious abnormality, atraumatic Eyes: negative Ears: normal TM's and external ear canals both ears Nose: Nares normal. Septum midline. Mucosa normal. No drainage or sinus tenderness. Throat: Generalized chronic periodontitis and dental caries. teeth #'s 2, 4, 5, 6, 7, 8, 9, 10, 11, 12, 13, 17, 18, 19, 21, 22, 23, 24, 25, 26, 31, 32 Neck: no adenopathy, supple, symmetrical, trachea midline and thyroid not enlarged, symmetric, no tenderness/mass/nodules Resp: clear to auscultation bilaterally Cardio: regular rate and rhythm, S1, S2 normal, no murmur, click, rub or gallop  Assessment:56 BF Htn, DM, CHF, PVD with non-restorable teeth #'s 2, 4, 5, 6, 7, 8, 9, 10, 11, 12, 13, 17, 18, 19, 21, 22, 23, 24, 25, 26, 31, 32. Bilateral mandibular intra-bony lesions.  Plan: Extrac tteeth #'s 2, 4, 5, 6, 7, 8, 9, 10, 11, 12, 13, 17, 18, 19, 21, 22, 23, 24, 25, 26, 31, 32  With alveoloplasty. Bone biopsy  right and left mandible. General anesthesia. Day surgery.    Christine Garrett 08/20/2012

## 2012-08-24 ENCOUNTER — Ambulatory Visit (HOSPITAL_COMMUNITY): Payer: Medicare Other | Admitting: Certified Registered"

## 2012-08-24 ENCOUNTER — Encounter (HOSPITAL_COMMUNITY): Payer: Self-pay | Admitting: Certified Registered"

## 2012-08-24 ENCOUNTER — Encounter (HOSPITAL_COMMUNITY): Payer: Self-pay | Admitting: Vascular Surgery

## 2012-08-24 ENCOUNTER — Encounter (HOSPITAL_COMMUNITY)
Admission: RE | Disposition: A | Discharge status: Home / Self Care | Payer: Self-pay | Source: Ambulatory Visit | Attending: Oral Surgery

## 2012-08-24 ENCOUNTER — Ambulatory Visit (HOSPITAL_COMMUNITY)
Admission: RE | Admit: 2012-08-24 | Discharge: 2012-08-24 | Disposition: A | Discharge status: Home / Self Care | Payer: Medicare Other | Source: Ambulatory Visit | Attending: Oral Surgery | Admitting: Oral Surgery

## 2012-08-24 DIAGNOSIS — F17200 Nicotine dependence, unspecified, uncomplicated: Secondary | ICD-10-CM

## 2012-08-24 DIAGNOSIS — D649 Anemia, unspecified: Secondary | ICD-10-CM

## 2012-08-24 DIAGNOSIS — M1710 Unilateral primary osteoarthritis, unspecified knee: Secondary | ICD-10-CM | POA: Insufficient documentation

## 2012-08-24 DIAGNOSIS — K029 Dental caries, unspecified: Secondary | ICD-10-CM

## 2012-08-24 DIAGNOSIS — R0602 Shortness of breath: Secondary | ICD-10-CM

## 2012-08-24 DIAGNOSIS — R9430 Abnormal result of cardiovascular function study, unspecified: Secondary | ICD-10-CM

## 2012-08-24 DIAGNOSIS — K0530 Chronic periodontitis, unspecified: Secondary | ICD-10-CM

## 2012-08-24 DIAGNOSIS — K05329 Chronic periodontitis, generalized, unspecified severity: Secondary | ICD-10-CM | POA: Insufficient documentation

## 2012-08-24 DIAGNOSIS — I509 Heart failure, unspecified: Secondary | ICD-10-CM | POA: Insufficient documentation

## 2012-08-24 DIAGNOSIS — G4733 Obstructive sleep apnea (adult) (pediatric): Secondary | ICD-10-CM

## 2012-08-24 DIAGNOSIS — E119 Type 2 diabetes mellitus without complications: Secondary | ICD-10-CM

## 2012-08-24 DIAGNOSIS — Z794 Long term (current) use of insulin: Secondary | ICD-10-CM | POA: Insufficient documentation

## 2012-08-24 DIAGNOSIS — I10 Essential (primary) hypertension: Secondary | ICD-10-CM

## 2012-08-24 DIAGNOSIS — M898X9 Other specified disorders of bone, unspecified site: Secondary | ICD-10-CM

## 2012-08-24 DIAGNOSIS — I119 Hypertensive heart disease without heart failure: Secondary | ICD-10-CM

## 2012-08-24 DIAGNOSIS — M273 Alveolitis of jaws: Secondary | ICD-10-CM | POA: Insufficient documentation

## 2012-08-24 HISTORY — PX: MULTIPLE EXTRACTIONS WITH ALVEOLOPLASTY: SHX5342

## 2012-08-24 SURGERY — MULTIPLE EXTRACTION WITH ALVEOLOPLASTY
Anesthesia: General | Site: Mouth | Laterality: Bilateral | Wound class: Clean Contaminated

## 2012-08-24 MED ORDER — HYDROMORPHONE HCL PF 1 MG/ML IJ SOLN
INTRAMUSCULAR | Status: AC
  Filled 2012-08-24: qty 1

## 2012-08-24 MED ORDER — MEPERIDINE HCL 25 MG/ML IJ SOLN: 6.2500 mg | INTRAMUSCULAR | Status: DC | PRN

## 2012-08-24 MED ORDER — SODIUM CHLORIDE 0.9 % IR SOLN
Status: DC | PRN
  Administered 2012-08-24: 1000 mL

## 2012-08-24 MED ORDER — INSULIN ASPART 100 UNIT/ML ~~LOC~~ SOLN
7.0000 [IU] | SUBCUTANEOUS | Status: AC
  Administered 2012-08-24: 7 [IU] via SUBCUTANEOUS
  Filled 2012-08-24: qty 0.07

## 2012-08-24 MED ORDER — OXYCODONE HCL 5 MG/5ML PO SOLN: 5.0000 mg | Freq: Once | ORAL | Status: DC | PRN

## 2012-08-24 MED ORDER — PROPOFOL 10 MG/ML IV BOLUS
INTRAVENOUS | Status: DC | PRN
  Administered 2012-08-24: 50 mg via INTRAVENOUS
  Administered 2012-08-24: 200 mg via INTRAVENOUS
  Administered 2012-08-24: 50 mg via INTRAVENOUS

## 2012-08-24 MED ORDER — LIDOCAINE HCL (CARDIAC) 20 MG/ML IV SOLN
INTRAVENOUS | Status: DC | PRN
  Administered 2012-08-24: 80 mg via INTRAVENOUS

## 2012-08-24 MED ORDER — OXYMETAZOLINE HCL 0.05 % NA SOLN
NASAL | Status: DC | PRN
  Administered 2012-08-24: 1 via NASAL

## 2012-08-24 MED ORDER — 0.9 % SODIUM CHLORIDE (POUR BTL) OPTIME
TOPICAL | Status: DC | PRN
  Administered 2012-08-24: 1000 mL

## 2012-08-24 MED ORDER — SUCCINYLCHOLINE CHLORIDE 20 MG/ML IJ SOLN
INTRAMUSCULAR | Status: DC | PRN
  Administered 2012-08-24: 100 mg via INTRAVENOUS

## 2012-08-24 MED ORDER — ONDANSETRON HCL 4 MG/2ML IJ SOLN
INTRAMUSCULAR | Status: DC | PRN
  Administered 2012-08-24: 4 mg via INTRAVENOUS

## 2012-08-24 MED ORDER — FENTANYL CITRATE 0.05 MG/ML IJ SOLN
INTRAMUSCULAR | Status: DC | PRN
  Administered 2012-08-24: 125 ug via INTRAVENOUS

## 2012-08-24 MED ORDER — MIDAZOLAM HCL 5 MG/5ML IJ SOLN
INTRAMUSCULAR | Status: DC | PRN
  Administered 2012-08-24: 2 mg via INTRAVENOUS

## 2012-08-24 MED ORDER — HYDROMORPHONE HCL PF 1 MG/ML IJ SOLN
0.2500 mg | INTRAMUSCULAR | Status: DC | PRN
  Administered 2012-08-24 (×2): 0.5 mg via INTRAVENOUS

## 2012-08-24 MED ORDER — AMOXICILLIN 500 MG PO CAPS: 500.0000 mg | ORAL_CAPSULE | Freq: Three times a day (TID) | ORAL | Status: DC

## 2012-08-24 MED ORDER — PROMETHAZINE HCL 25 MG/ML IJ SOLN: 6.2500 mg | INTRAMUSCULAR | Status: DC | PRN

## 2012-08-24 MED ORDER — LIDOCAINE-EPINEPHRINE 2 %-1:100000 IJ SOLN
INTRAMUSCULAR | Status: AC
  Filled 2012-08-24: qty 1

## 2012-08-24 MED ORDER — OXYCODONE HCL 5 MG PO TABS: 5.0000 mg | ORAL_TABLET | Freq: Once | ORAL | Status: DC | PRN

## 2012-08-24 MED ORDER — LACTATED RINGERS IV SOLN
INTRAVENOUS | Status: DC | PRN
  Administered 2012-08-24: 08:00:00 via INTRAVENOUS

## 2012-08-24 MED ORDER — OXYMETAZOLINE HCL 0.05 % NA SOLN
NASAL | Status: AC
  Filled 2012-08-24: qty 15

## 2012-08-24 MED ORDER — LIDOCAINE-EPINEPHRINE 2 %-1:100000 IJ SOLN
INTRAMUSCULAR | Status: DC | PRN
  Administered 2012-08-24: 20 mL

## 2012-08-24 MED ORDER — OXYCODONE-ACETAMINOPHEN 5-325 MG PO TABS: 1.0000 | ORAL_TABLET | ORAL | Status: DC | PRN

## 2012-08-24 SURGICAL SUPPLY — 29 items
BLADE SURG 15 STRL LF DISP TIS (BLADE) ×1 IMPLANT
BLADE SURG 15 STRL SS (BLADE) ×1
BUR CROSS CUT FISSURE 1.6 (BURR) ×2 IMPLANT
BUR EGG ELITE 4.0 (BURR) ×2 IMPLANT
CANISTER SUCTION 2500CC (MISCELLANEOUS) ×2 IMPLANT
CLOTH BEACON ORANGE TIMEOUT ST (SAFETY) ×2 IMPLANT
COVER SURGICAL LIGHT HANDLE (MISCELLANEOUS) ×2 IMPLANT
CRADLE DONUT ADULT HEAD (MISCELLANEOUS) ×2 IMPLANT
DECANTER SPIKE VIAL GLASS SM (MISCELLANEOUS) ×2 IMPLANT
GAUZE PACKING FOLDED 2  STR (GAUZE/BANDAGES/DRESSINGS) ×1
GAUZE PACKING FOLDED 2 STR (GAUZE/BANDAGES/DRESSINGS) ×1 IMPLANT
GLOVE BIO SURGEON STRL SZ 6.5 (GLOVE) ×2 IMPLANT
GLOVE BIO SURGEON STRL SZ7.5 (GLOVE) ×2 IMPLANT
GLOVE BIOGEL PI IND STRL 7.0 (GLOVE) ×1 IMPLANT
GLOVE BIOGEL PI INDICATOR 7.0 (GLOVE) ×1
GOWN STRL NON-REIN LRG LVL3 (GOWN DISPOSABLE) ×2 IMPLANT
GOWN STRL REIN XL XLG (GOWN DISPOSABLE) ×2 IMPLANT
KIT BASIN OR (CUSTOM PROCEDURE TRAY) ×2 IMPLANT
KIT ROOM TURNOVER OR (KITS) ×2 IMPLANT
NEEDLE 22X1 1/2 (OR ONLY) (NEEDLE) ×2 IMPLANT
NS IRRIG 1000ML POUR BTL (IV SOLUTION) ×2 IMPLANT
PAD ARMBOARD 7.5X6 YLW CONV (MISCELLANEOUS) ×4 IMPLANT
SUT CHROMIC 3 0 PS 2 (SUTURE) ×4 IMPLANT
SYR CONTROL 10ML LL (SYRINGE) ×2 IMPLANT
TOWEL OR 17X26 10 PK STRL BLUE (TOWEL DISPOSABLE) ×2 IMPLANT
TRAY ENT MC OR (CUSTOM PROCEDURE TRAY) ×2 IMPLANT
TUBING IRRIGATION (MISCELLANEOUS) ×2 IMPLANT
WATER STERILE IRR 1000ML POUR (IV SOLUTION) IMPLANT
YANKAUER SUCT BULB TIP NO VENT (SUCTIONS) ×2 IMPLANT

## 2012-08-24 NOTE — Anesthesia Preprocedure Evaluation (Addendum)
Anesthesia Evaluation  Patient identified by MRN, date of birth, ID band Patient awake    Reviewed: Allergy & Precautions, H&P , NPO status , Patient's Chart, lab work & pertinent test results  Airway Mallampati: I  Neck ROM: Full    Dental  (+) Teeth Intact and Poor Dentition   Pulmonary shortness of breath, sleep apnea ,  breath sounds clear to auscultation        Cardiovascular hypertension, + Peripheral Vascular Disease and +CHF Rhythm:Regular Rate:Normal  Cath WNL   Neuro/Psych  Neuromuscular disease    GI/Hepatic negative GI ROS, Neg liver ROS,   Endo/Other  diabetesMorbid obesity  Renal/GU      Musculoskeletal  (+) Arthritis -,   Abdominal   Peds  Hematology   Anesthesia Other Findings   Reproductive/Obstetrics                          Anesthesia Physical Anesthesia Plan  ASA: III  Anesthesia Plan: General   Post-op Pain Management:    Induction: Intravenous  Airway Management Planned: Nasal ETT  Additional Equipment:   Intra-op Plan:   Post-operative Plan: Extubation in OR  Informed Consent: I have reviewed the patients History and Physical, chart, labs and discussed the procedure including the risks, benefits and alternatives for the proposed anesthesia with the patient or authorized representative who has indicated his/her understanding and acceptance.   Dental advisory given  Plan Discussed with: Surgeon  Anesthesia Plan Comments:         Anesthesia Quick Evaluation

## 2012-08-24 NOTE — Op Note (Signed)
08/24/2012  8:41 AM  PATIENT:  Christine Garrett  57 y.o. female  PRE-OPERATIVE DIAGNOSIS:  NON RESTORABLE TEETH #'s 2, 4, 5, 6, 7, 8, 9, 10, 11, 12, 13, 17, 18, 19, 21, 22, 23, 24, 25, 26, 27, 31, 32, Bony lesion right and left mandible  POST-OPERATIVE DIAGNOSIS:  SAME  PROCEDURE:  Procedure(s): MULTIPLE EXTRACTION  TEETH #'s 2, 4, 5, 6, 7, 8, 9, 10, 11, 12, 13, 17, 18, 19, 21, 22, 23, 24, 25, 26, 27, 31, 32,   ALVEOLOPLASTY Right and Left maxilla and mandible,  BIOPSY OF RIGHT AND LEFT MANDIBLE   SURGEON:  Surgeon(s): Georgia Lopes, DDS  ANESTHESIA:   local and general  EBL:  minimal  DRAINS: none   SPECIMEN: Bone right and left mandible  COUNTS:  YES  PLAN OF CARE: Discharge to home after PACU  PATIENT DISPOSITION:  PACU - hemodynamically stable.   PROCEDURE DETAILS: Dictation # [id number]  Georgia Lopes, DMD 08/24/2012 8:41 AM

## 2012-08-24 NOTE — Anesthesia Procedure Notes (Signed)
Procedure Name: Intubation Date/Time: 08/24/2012 7:48 AM Performed by: Jerilee Hoh Pre-anesthesia Checklist: Patient identified, Emergency Drugs available, Suction available and Patient being monitored Patient Re-evaluated:Patient Re-evaluated prior to inductionOxygen Delivery Method: Circle system utilized Preoxygenation: Pre-oxygenation with 100% oxygen Intubation Type: IV induction Ventilation: Mask ventilation without difficulty Laryngoscope Size: Mac and 3 Grade View: Grade I Nasal Tubes: Right, Nasal Rae and Nasal prep performed Tube size: 7.0 mm Number of attempts: 1 Placement Confirmation: ETT inserted through vocal cords under direct vision,  positive ETCO2 and breath sounds checked- equal and bilateral Tube secured with: Tape Dental Injury: Teeth and Oropharynx as per pre-operative assessment

## 2012-08-24 NOTE — Anesthesia Postprocedure Evaluation (Signed)
  Anesthesia Post-op Note  Patient: Christine Garrett  Procedure(s) Performed: Procedure(s): MULTIPLE EXTRACTION WITH ALVEOLOPLASTY BIOPSY OF RIGHT AND LEFT MANDIBLE  (Bilateral)  Patient Location: PACU  Anesthesia Type:General  Level of Consciousness: awake  Airway and Oxygen Therapy: Patient Spontanous Breathing  Post-op Pain: mild  Post-op Assessment: Post-op Vital signs reviewed  Post-op Vital Signs: stable  Complications: No apparent anesthesia complications

## 2012-08-24 NOTE — Preoperative (Signed)
Beta Blockers   Reason not to administer Beta Blockers:Not Applicable 

## 2012-08-24 NOTE — Progress Notes (Signed)
Dr. Teola Bradley notified that he did not sign Prescription for Christine Garrett

## 2012-08-24 NOTE — Transfer of Care (Signed)
Immediate Anesthesia Transfer of Care Note  Patient: Christine Garrett  Procedure(s) Performed: Procedure(s): MULTIPLE EXTRACTION WITH ALVEOLOPLASTY BIOPSY OF RIGHT AND LEFT MANDIBLE  (Bilateral)  Patient Location: PACU  Anesthesia Type:General  Level of Consciousness: awake, alert , oriented and patient cooperative  Airway & Oxygen Therapy: Patient Spontanous Breathing and Patient connected to nasal cannula oxygen  Post-op Assessment: Report given to PACU RN, Post -op Vital signs reviewed and stable and Patient moving all extremities  Post vital signs: Reviewed and stable  Complications: No apparent anesthesia complications

## 2012-08-24 NOTE — H&P (Signed)
H&P documentation  -History and Physical Reviewed  -Patient has been re-examined  -No change in the plan of care  JENSEN,SCOTT M  

## 2012-08-25 NOTE — Op Note (Signed)
Christine Garrett, Christine Garrett           ACCOUNT NO.:  [id number]  MEDICAL RECORD NO.:  [id number]  LOCATION:  [facility]                         FACILITY:  [facility]  PHYSICIAN:  Georgia Lopes, M.D.  DATE OF BIRTH:  07/26/1955  DATE OF PROCEDURE:  08/24/2012 DATE OF DISCHARGE:  08/24/2012                              OPERATIVE REPORT   PREOPERATIVE DIAGNOSIS:  Nonrestorable teeth #2, #4, #5, #6, #7, #8, #9, #10, #11, #12, #13, #17, #18, #19, #21, #22, #23, #24, #25, #26, #27, #31, #32, bony lesion right and left mandible body.  POSTOPERATIVE DIAGNOSIS:  Nonrestorable teeth #2, #4, #5, #6, #7, #8, #9, #10, #11, #12, #13, #17, #18, #19, #21, #22, #23, #24, #25, #26, #27, #31, #32, bony lesion right and left mandible body.  PROCEDURE:  Extraction teeth #2, #4, #5, #6, #7, #8, #9, #10, #11, #12, #13, #17, #18, #19, #21, #22, #23, #24, #25, #26, #27, #31, #32, alveoplasty right and left maxilla and mandible, biopsy of bone right and left mandible.  SURGEON:  Georgia Lopes, M.D.  ANESTHESIA:  General, Dr. Jacklynn Bue attending, nasal intubation.  INDICATIONS FOR PROCEDURE:  Christine Garrett is a 57 year old female who is referred to my office from friends who had painful teeth.  She was examined and found to have severe dental caries and chronic generalized periodontitis with excessive bone loss.  In addition, there were mixed radiolucent radiopaque lesions of the right and left mandible noted on radiograph.  It was determined that all teeth were nonrestorable and that the mandibular lesions were more than likely condensing osteitis. It was therefore recommended that the patient undergo surgery with general anesthesia to remove all remaining teeth and perform alveoplasty and biopsy of the bone to rule out malignancy.  PROCEDURE:  The patient was taken to the operating room, placed on the table in supine position.  General anesthesia was administered via intravenous medications and a nasal endotracheal  tube was placed and secured.  The eyes were protected.  The table was draped and then time- out was performed.  The posterior pharynx was suctioned.  A throat pack was placed.  2% lidocaine with 1:100,000 epinephrine was infiltrated in an inferior alveolar block on the right and left side and then buccal and palatal infiltration in the maxilla on the right and left side.  A bite block was placed in the right side of the mouth and a sweetheart retractor was placed to retract the tongue.  Then, a 15-blade was used to make a full-thickness incision on the buccal and lingual aspects of teeth #17, #18, #19, #21, #22, #23, #24, #25, #26, and #27.  The periosteum was reflected buccally and lingually.  Teeth were elevated with 301 elevator.  The posterior teeth were elevated with a larger elevator and attempted to be removed with the #17 forceps, however, the teeth were blocked in the bone, the teeth were then sectioned, the crown removed and then the root sectioned and eventually the teeth were removed with the 301 elevator and rongeurs.  The anterior teeth were removed with the Asch forceps and were easy to remove.  Sockets were then curetted.  The periosteum was reflected further to expose the alveolar bone.  The  handpiece under irrigation with fissure bur was used to remove bone and the area that had been identified on the x-ray as dysplastic bone.  The bone appeared to be hyper calcified and was extremely dense.  Small segment of this bone was removed and submitted for Pathology.  Then alveoplasty was performed with the egg-shaped bur. The area was further smoothed with a bone file and then irrigated and closed with 3-0 chromic in the maxilla.  A 15-blade was used to make a full-thickness incision around teeth #13, #12, #11, #10, #9, #8, #7, #6 on the buccal and palatal aspects.  Periosteum was reflected and then the teeth were removed with the upper universal forceps.  All teeth were simple  extractions.  Then the sockets were curetted.  The periosteum was further reflected to expose the alveolar ridge, and then the egg-shaped bur and bone file were used to perform alveoplasty.  The area was then irrigated and closed with 3-0 chromic.  The bite block and sweetheart retractor were repositioned to the other side of the mouth.  A 15-blade was used to make a full-thickness incision around tooth #31 and #32 extending to the distal aspect of the incision of tooth #27, and then the periosteal elevator was used to reflect the periosteum. Interproximal bone was removed with the fissure bur under irrigation and the teeth were removed with a dental forceps after elevating with a 301 NE elevator.  The sockets were curetted.  Then, a bony window was created with the fissure bur approximately 1.5 x 1.0 cm in the area of tooth #30 on the buccal cortex, this was fractured off with the 301 elevator.  Soft bone was then curetted with a curette and submitted for Pathology.  The bony fragment was then repositioned and alveoplasty was performed with the egg-shaped bur and bone file and then the area was irrigated and sutured with 3-0 chromic.  In the maxilla, a 15-blade was used to make a full-thickness incision around teeth #2, #4, #5.  The periosteum was reflected.  Teeth were elevated and removed with the upper universal forceps.  The sockets were curetted, then alveoplasty was performed with the egg-shaped bur.  Then, the area was irrigated and closed with 3-0 chromic.  The oral cavity was then inspected and found to have good contour, hemostasis, and closure.  The oral cavity was irrigated and suctioned.  Throat pack was removed.  The patient was awakened, taken to the recovery room, breathing spontaneously in good condition.  ESTIMATED BLOOD LOSS:  Minimum.  COMPLICATIONS:  None.  SPECIMENS:  Bone right and left mandible.  PRELIMINARY DIAGNOSIS:  Condensing osteitis.  The patient was  scheduled for discharge through Day Surgery.     Georgia Lopes, M.D.     SMJ/MEDQ  D:  08/24/2012  T:  08/25/2012  Job:  [id number]

## 2012-08-26 ENCOUNTER — Encounter (HOSPITAL_COMMUNITY): Payer: Self-pay | Admitting: Oral Surgery

## 2015-07-06 DIAGNOSIS — E559 Vitamin D deficiency, unspecified: Secondary | ICD-10-CM | POA: Diagnosis not present

## 2015-07-06 DIAGNOSIS — I10 Essential (primary) hypertension: Secondary | ICD-10-CM | POA: Diagnosis not present

## 2015-07-06 DIAGNOSIS — E7801 Familial hypercholesterolemia: Secondary | ICD-10-CM | POA: Diagnosis not present

## 2015-07-06 DIAGNOSIS — Z1211 Encounter for screening for malignant neoplasm of colon: Secondary | ICD-10-CM | POA: Diagnosis not present

## 2015-07-06 DIAGNOSIS — E118 Type 2 diabetes mellitus with unspecified complications: Secondary | ICD-10-CM | POA: Diagnosis not present

## 2015-07-06 DIAGNOSIS — M47816 Spondylosis without myelopathy or radiculopathy, lumbar region: Secondary | ICD-10-CM | POA: Diagnosis not present

## 2015-07-06 DIAGNOSIS — Z0000 Encounter for general adult medical examination without abnormal findings: Secondary | ICD-10-CM | POA: Diagnosis not present

## 2015-07-06 DIAGNOSIS — M545 Low back pain: Secondary | ICD-10-CM | POA: Diagnosis not present

## 2015-07-06 DIAGNOSIS — I739 Peripheral vascular disease, unspecified: Secondary | ICD-10-CM | POA: Diagnosis not present

## 2015-07-06 DIAGNOSIS — Z1231 Encounter for screening mammogram for malignant neoplasm of breast: Secondary | ICD-10-CM | POA: Diagnosis not present

## 2015-07-06 DIAGNOSIS — Z124 Encounter for screening for malignant neoplasm of cervix: Secondary | ICD-10-CM | POA: Diagnosis not present

## 2015-07-13 DIAGNOSIS — Z1231 Encounter for screening mammogram for malignant neoplasm of breast: Secondary | ICD-10-CM | POA: Diagnosis not present

## 2015-11-06 DIAGNOSIS — E1165 Type 2 diabetes mellitus with hyperglycemia: Secondary | ICD-10-CM | POA: Diagnosis not present

## 2016-03-11 DIAGNOSIS — M545 Low back pain: Secondary | ICD-10-CM | POA: Diagnosis not present

## 2016-03-11 DIAGNOSIS — M1710 Unilateral primary osteoarthritis, unspecified knee: Secondary | ICD-10-CM | POA: Diagnosis not present

## 2016-03-11 DIAGNOSIS — E1165 Type 2 diabetes mellitus with hyperglycemia: Secondary | ICD-10-CM | POA: Diagnosis not present

## 2016-07-11 DIAGNOSIS — E118 Type 2 diabetes mellitus with unspecified complications: Secondary | ICD-10-CM | POA: Diagnosis not present

## 2016-07-11 DIAGNOSIS — Z1211 Encounter for screening for malignant neoplasm of colon: Secondary | ICD-10-CM | POA: Diagnosis not present

## 2016-07-11 DIAGNOSIS — Z1231 Encounter for screening mammogram for malignant neoplasm of breast: Secondary | ICD-10-CM | POA: Diagnosis not present

## 2016-07-11 DIAGNOSIS — Z124 Encounter for screening for malignant neoplasm of cervix: Secondary | ICD-10-CM | POA: Diagnosis not present

## 2016-07-11 DIAGNOSIS — I10 Essential (primary) hypertension: Secondary | ICD-10-CM | POA: Diagnosis not present

## 2016-07-11 DIAGNOSIS — Z0000 Encounter for general adult medical examination without abnormal findings: Secondary | ICD-10-CM | POA: Diagnosis not present

## 2016-07-11 DIAGNOSIS — Z23 Encounter for immunization: Secondary | ICD-10-CM | POA: Diagnosis not present

## 2016-07-11 DIAGNOSIS — E559 Vitamin D deficiency, unspecified: Secondary | ICD-10-CM | POA: Diagnosis not present

## 2016-07-11 DIAGNOSIS — D509 Iron deficiency anemia, unspecified: Secondary | ICD-10-CM | POA: Diagnosis not present

## 2016-07-11 DIAGNOSIS — Z6826 Body mass index (BMI) 26.0-26.9, adult: Secondary | ICD-10-CM | POA: Diagnosis not present

## 2016-07-11 DIAGNOSIS — E7801 Familial hypercholesterolemia: Secondary | ICD-10-CM | POA: Diagnosis not present

## 2016-08-19 DIAGNOSIS — E785 Hyperlipidemia, unspecified: Secondary | ICD-10-CM | POA: Diagnosis not present

## 2016-08-19 LAB — LIPID PANEL
Cholesterol: 152 mg/dL (ref 0–200)
HDL: 29 mg/dL — AB (ref 35–70)
LDL Cholesterol: 87 mg/dL
Triglycerides: 179 mg/dL — AB (ref 40–160)

## 2016-08-19 LAB — HEMOGLOBIN A1C: Hemoglobin A1C: 12.7

## 2016-10-08 DIAGNOSIS — E1165 Type 2 diabetes mellitus with hyperglycemia: Secondary | ICD-10-CM | POA: Diagnosis not present

## 2016-10-08 DIAGNOSIS — R1013 Epigastric pain: Secondary | ICD-10-CM | POA: Diagnosis not present

## 2016-10-10 ENCOUNTER — Encounter (INDEPENDENT_AMBULATORY_CARE_PROVIDER_SITE_OTHER): Payer: Self-pay | Admitting: Internal Medicine

## 2016-10-30 ENCOUNTER — Encounter (INDEPENDENT_AMBULATORY_CARE_PROVIDER_SITE_OTHER): Payer: Self-pay | Admitting: Internal Medicine

## 2016-10-30 ENCOUNTER — Encounter (INDEPENDENT_AMBULATORY_CARE_PROVIDER_SITE_OTHER): Payer: Self-pay | Admitting: *Deleted

## 2016-10-30 ENCOUNTER — Ambulatory Visit (INDEPENDENT_AMBULATORY_CARE_PROVIDER_SITE_OTHER): Payer: Medicare Other | Admitting: Internal Medicine

## 2016-10-30 ENCOUNTER — Other Ambulatory Visit (INDEPENDENT_AMBULATORY_CARE_PROVIDER_SITE_OTHER): Payer: Self-pay | Admitting: Internal Medicine

## 2016-10-30 VITALS — BP 182/90 | HR 64 | Temp 98.3°F | Ht 69.5 in | Wt 212.9 lb

## 2016-10-30 DIAGNOSIS — G8929 Other chronic pain: Secondary | ICD-10-CM | POA: Insufficient documentation

## 2016-10-30 DIAGNOSIS — K219 Gastro-esophageal reflux disease without esophagitis: Secondary | ICD-10-CM

## 2016-10-30 DIAGNOSIS — R1013 Epigastric pain: Secondary | ICD-10-CM

## 2016-10-30 NOTE — Progress Notes (Signed)
   Subjective:    Patient ID: Christine Garrett, female    DOB: 07-28-1955, 61 y.o.   MRN: [id number]  HPI Referred by Dr. Wenda Overland for epigastric pain.  the past 4 weeks.. She has epigastric pain and pain left abdomen. Had been taking Aleve x 2 a day and Naproxen twice a day for arthritis pain. Had been taking NSAIDs x 2 years. She has not seen any blood in her stools. Stools are brown in color. Has not drank etoh in 4 weeks. She states she was a heavy; drinker. She was started on Protonix 40mg  daily by Dr. Wenda Overland. Her appetite is not good.  She states she has had some weight loss.    07/11/2016 H and H 12.8 and 37.2.    Review of Systems Past Medical History:  Diagnosis Date  . Arthritis    knees  . Carpal tunnel syndrome   . CHF (congestive heart failure) ([place])   . Diabetes mellitus    type 2  . Heart murmur    "Little, No concerns" per Dr  Dannielle Burn pt reported.  . Hypertension    controlled using a guided approch with plasma renin activity  . Neuropathy   . Peripheral vascular disease [facility])     Past Surgical History:  Procedure Laterality Date  . [place]  . CERVICAL CONE BIOPSY     cervical lesion  . lipoma removal     right shoulder 2001  . MULTIPLE EXTRACTIONS WITH ALVEOLOPLASTY Bilateral 08/24/2012   Procedure: MULTIPLE EXTRACTION WITH ALVEOLOPLASTY BIOPSY OF RIGHT AND LEFT MANDIBLE ;  Surgeon: Gae Bon, DDS;  Location: [place];  Service: Oral Surgery;  Laterality: Bilateral;  . ROTATOR CUFF REPAIR     right shoulder  . TUBAL LIGATION      No Known Allergies  Current Outpatient Prescriptions on File Prior to Visit  Medication Sig Dispense Refill  . chlorthalidone (HYGROTON) 50 MG tablet Take 50 mg by mouth daily.    Marland Kitchen diltiazem (TIAZAC) 300 MG 24 hr capsule Take 300 mg by mouth daily.    . enalapril (VASOTEC) 20 MG tablet Take 20 mg by mouth 2 (two) times daily.     Marland Kitchen labetalol (NORMODYNE) 200 MG tablet Take 400 mg by mouth 2 (two) times daily.    .  metFORMIN (GLUCOPHAGE) 1000 MG tablet Take 1,000 mg by mouth 2 (two) times daily with a meal.    . oxyCODONE-acetaminophen (PERCOCET) 5-325 MG per tablet Take 1-2 tablets by mouth every 4 (four) hours as needed for pain. 40 tablet 0   No current facility-administered medications on file prior to visit.        Objective:   Physical Exam Blood pressure (!) 182/90, pulse 64, temperature 98.3 F (36.8 C), height 5' 9.5" (1.765 m), weight 212 lb 14.4 oz (96.6 kg). Alert and oriented. Skin warm and dry. Oral mucosa is moist.   . Sclera anicteric, conjunctivae is pink. Thyroid not enlarged. No cervical lymphadenopathy. Lungs clear. Heart regular rate and rhythm.  Abdomen is soft. Bowel sounds are positive. No hepatomegaly. No abdominal masses felt. No tenderness.  No edema to lower extremities.  .       Assessment & Plan:  Epigastric pain. PUD needs to be ruled out. EGD with propofol.

## 2016-10-30 NOTE — Patient Instructions (Signed)
EGD with propofol.  Continue the  Protonix

## 2016-11-04 ENCOUNTER — Ambulatory Visit (INDEPENDENT_AMBULATORY_CARE_PROVIDER_SITE_OTHER): Payer: Medicare Other | Admitting: "Endocrinology

## 2016-11-04 ENCOUNTER — Encounter: Payer: Self-pay | Admitting: "Endocrinology

## 2016-11-04 VITALS — BP 154/90 | HR 101 | Ht 69.5 in | Wt 213.0 lb

## 2016-11-04 DIAGNOSIS — E6609 Other obesity due to excess calories: Secondary | ICD-10-CM | POA: Diagnosis not present

## 2016-11-04 DIAGNOSIS — IMO0002 Reserved for concepts with insufficient information to code with codable children: Secondary | ICD-10-CM

## 2016-11-04 DIAGNOSIS — Z6831 Body mass index (BMI) 31.0-31.9, adult: Secondary | ICD-10-CM

## 2016-11-04 DIAGNOSIS — E1165 Type 2 diabetes mellitus with hyperglycemia: Secondary | ICD-10-CM | POA: Diagnosis not present

## 2016-11-04 DIAGNOSIS — E118 Type 2 diabetes mellitus with unspecified complications: Secondary | ICD-10-CM

## 2016-11-04 DIAGNOSIS — I10 Essential (primary) hypertension: Secondary | ICD-10-CM

## 2016-11-04 DIAGNOSIS — E7800 Pure hypercholesterolemia, unspecified: Secondary | ICD-10-CM | POA: Diagnosis not present

## 2016-11-04 NOTE — Progress Notes (Signed)
Subjective:    Patient ID: Christine Garrett, female    DOB: 1956/06/02. Patient is being seen in consultation for management of diabetes requested by  Deloria Lair., MD  Past Medical History:  Diagnosis Date  . Arthritis    knees  . Carpal tunnel syndrome   . CHF (congestive heart failure) ([place])   . Diabetes mellitus    type 2  . Heart murmur    "Little, No concerns" per Dr  Dannielle Burn pt reported.  . Hypertension    controlled using a guided approch with plasma renin activity  . Neuropathy   . Peripheral vascular disease [facility])    Past Surgical History:  Procedure Laterality Date  . [place]  . CERVICAL CONE BIOPSY     cervical lesion  . lipoma removal     right shoulder 2001  . MULTIPLE EXTRACTIONS WITH ALVEOLOPLASTY Bilateral 08/24/2012   Procedure: MULTIPLE EXTRACTION WITH ALVEOLOPLASTY BIOPSY OF RIGHT AND LEFT MANDIBLE ;  Surgeon: Gae Bon, DDS;  Location: [place];  Service: Oral Surgery;  Laterality: Bilateral;  . ROTATOR CUFF REPAIR     right shoulder  . TUBAL LIGATION     Social History   Social History  . Marital status: Married    Spouse name: N/A  . Number of children: N/A  . Years of education: N/A   Social History Main Topics  . Smoking status: Former Smoker    Years: 6.00  . Smokeless tobacco: Never Used     Comment: quit summer 2013  . Alcohol use 3.6 oz/week    6 Cans of beer per week  . Drug use: No  . Sexual activity: Not Asked   Other Topics Concern  . None   Social History Narrative  . None   Outpatient Encounter Prescriptions as of 11/04/2016  Medication Sig  . atorvastatin (LIPITOR) 40 MG tablet Take 40 mg by mouth daily.  . chlorthalidone (HYGROTON) 50 MG tablet Take 50 mg by mouth daily.  Marland Kitchen diltiazem (TIAZAC) 300 MG 24 hr capsule Take 300 mg by mouth daily.  . enalapril (VASOTEC) 20 MG tablet Take 20 mg by mouth 2 (two) times daily.   Marland Kitchen labetalol (NORMODYNE) 200 MG tablet Take 400 mg by mouth 2 (two) times daily.  .  metFORMIN (GLUCOPHAGE) 1000 MG tablet Take 1,000 mg by mouth 2 (two) times daily with a meal.  . oxyCODONE-acetaminophen (PERCOCET) 5-325 MG per tablet Take 1-2 tablets by mouth every 4 (four) hours as needed for pain.  . pantoprazole (PROTONIX) 40 MG tablet Take 40 mg by mouth daily.  . traMADol (ULTRAM) 50 MG tablet TAKE 1 TABLET BY MOUTH EVERY 4 HOURS AS NEEDED FOR 30 DAYS   No facility-administered encounter medications on file as of 11/04/2016.    ALLERGIES: No Known Allergies VACCINATION STATUS:  There is no immunization history on file for this patient.  Diabetes  She presents for her initial diabetic visit. She has type 2 diabetes mellitus. Onset time: She was diagnosed at approximate age of 62 years. Her disease course has been worsening. There are no hypoglycemic associated symptoms. Pertinent negatives for hypoglycemia include no confusion, headaches, pallor or seizures. Associated symptoms include blurred vision, fatigue, polyphagia and polyuria. Pertinent negatives for diabetes include no chest pain and no polydipsia. There are no hypoglycemic complications. Symptoms are worsening. Diabetic complications include peripheral neuropathy. Risk factors for coronary artery disease include dyslipidemia, diabetes mellitus, family history, hypertension, sedentary lifestyle and  tobacco exposure. Current diabetic treatment includes oral agent (monotherapy). Her weight is decreasing steadily. She is following a generally unhealthy diet. When asked about meal planning, she reported none. She has not had a previous visit with a dietitian. She never participates in exercise. (She came with no meter nor logs to review. She admits that she does not monitor blood glucose regularly.) An ACE inhibitor/angiotensin II receptor blocker is being taken.  Hyperlipidemia  This is a chronic problem. The current episode started more than 1 year ago. The problem is uncontrolled. Exacerbating diseases include diabetes  and obesity. Pertinent negatives include no chest pain, myalgias or shortness of breath. Current antihyperlipidemic treatment includes statins. Risk factors for coronary artery disease include diabetes mellitus, dyslipidemia, hypertension, obesity, a sedentary lifestyle and post-menopausal.  Hypertension  This is a chronic problem. The current episode started more than 1 year ago. The problem is uncontrolled. Associated symptoms include blurred vision. Pertinent negatives include no chest pain, headaches, palpitations or shortness of breath. Risk factors for coronary artery disease include diabetes mellitus, dyslipidemia, obesity, post-menopausal state, sedentary lifestyle, smoking/tobacco exposure and family history. Past treatments include ACE inhibitors.       Review of Systems  Constitutional: Positive for fatigue. Negative for chills, fever and unexpected weight change.  HENT: Negative for trouble swallowing and voice change.   Eyes: Positive for blurred vision. Negative for visual disturbance.  Respiratory: Negative for cough, shortness of breath and wheezing.   Cardiovascular: Negative for chest pain, palpitations and leg swelling.  Gastrointestinal: Negative for diarrhea, nausea and vomiting.  Endocrine: Positive for polyphagia and polyuria. Negative for cold intolerance, heat intolerance and polydipsia.  Musculoskeletal: Negative for arthralgias and myalgias.  Skin: Negative for color change, pallor, rash and wound.  Neurological: Negative for seizures and headaches.  Psychiatric/Behavioral: Negative for confusion and suicidal ideas.    Objective:    BP (!) 154/90   Pulse (!) 101   Ht 5' 9.5" (1.765 m)   Wt 213 lb (96.6 kg)   BMI 31.00 kg/m   Wt Readings from Last 3 Encounters:  11/04/16 213 lb (96.6 kg)  10/30/16 212 lb 14.4 oz (96.6 kg)  08/18/12 239 lb (108.4 kg)    Physical Exam  Constitutional: She is oriented to person, place, and time. She appears well-developed.   HENT:  Head: Normocephalic and atraumatic.  Eyes: EOM are normal.  Neck: Normal range of motion. Neck supple. No tracheal deviation present. No thyromegaly present.  Cardiovascular: Normal rate and regular rhythm.   Pulmonary/Chest: Effort normal and breath sounds normal.  Abdominal: Soft. Bowel sounds are normal. There is no tenderness. There is no guarding.  Musculoskeletal: Normal range of motion. She exhibits no edema.  Neurological: She is alert and oriented to person, place, and time. She has normal reflexes. No cranial nerve deficit. Coordination normal.  Skin: Skin is warm and dry. No rash noted. No erythema. No pallor.  Psychiatric: She has a normal mood and affect. Judgment normal.    CMP     Component Value Date/Time   NA 134 (L) 08/18/2012 1102   K 3.5 08/18/2012 1102   CL 95 (L) 08/18/2012 1102   CO2 28 08/18/2012 1102   GLUCOSE 405 (H) 08/18/2012 1102   BUN 12 08/18/2012 1102   CREATININE 0.82 08/18/2012 1102   CALCIUM 10.4 08/18/2012 1102   GFRNONAA 80 (L) 08/18/2012 1102   GFRAA >90 08/18/2012 1102   Diabetic Labs (most recent): Lab Results  Component Value Date   HGBA1C  12.7 08/19/2016     Lipid Panel ( most recent) Lipid Panel     Component Value Date/Time   CHOL 152 08/19/2016   TRIG 179 (A) 08/19/2016   HDL 29 (A) 08/19/2016   LDLCALC 87 08/19/2016      Assessment & Plan:   1. Uncontrolled type 2 diabetes mellitus with complication, without long-term current use of insulin ([place])   - Patient has currently uncontrolled symptomatic type 2 DM since  61 years of age,  with most recent A1c of 12.7 %. Recent labs reviewed.   Her diabetes is complicated by obesity/sedentary life, and patient remains at a high risk for more acute and chronic complications of diabetes which include CAD, CVA, CKD, retinopathy, and neuropathy. These are all discussed in detail with the patient.  - I have counseled the patient on diet management and weight loss, by  adopting a carbohydrate restricted/protein rich diet.  - Suggestion is made for patient to avoid simple carbohydrates   from their diet including Cakes , Desserts, Ice Cream,  Soda (  diet and regular) , Sweet Tea , Candies,  Chips, Cookies, Artificial Sweeteners,   and "Sugar-free" Products . This will help patient to have stable blood glucose profile and potentially avoid unintended weight gain.  - I encouraged the patient to switch to  unprocessed or minimally processed complex starch and increased protein intake (animal or plant source), fruits, and vegetables.  - Patient is advised to stick to a routine mealtimes to eat 3 meals  a day and avoid unnecessary snacks ( to snack only to correct hypoglycemia).  - The patient will be scheduled with Jearld Fenton, RDN, CDE for individualized DM education.  - I have approached patient with the following individualized plan to manage diabetes and patient agrees:   -  Given her current glycemic burden, she will need basal/bolus insulin to treat diabetes to target. -However, it is essential to assure her commitment for proper monitoring blood glucose for safe use of insulin. -Accordingly, I urged her to initiate strict monitoring blood glucose 4 times a day-before meals and at bedtime and return in one week with her meter and logs to review  -Patient is encouraged to call clinic for blood glucose levels less than 70 or above 300 mg /dl. - I will continue metformin 1000 g by mouth twice a day, therapeutically suitable for patient.  - Patient will be considered for incretin therapy as appropriate next visit. - Patient specific target  A1c;  LDL, HDL, Triglycerides, and  Waist Circumference were discussed in detail.  2) BP/HTN: Uncontrolled. She has enough medications for hypertension. I advised her to be consistent on her medications which would include labetalol 200 mg by mouth twice a day, enalapril 20 mg by mouth twice a day, diltiazem 300 mg by mouth  daily, chlorthalidone 50 mg by mouth daily.  3) Lipids/HPL:   Uncontrolled, LDL 87 triglyceride 179.   Patient is advised to continue Lipitor 40 mg by mouth at bedtime. 4)  Weight/Diet: CDE Consult will be initiated , exercise, and detailed carbohydrates information provided.  5) Chronic Care/Health Maintenance:  -Patient is on ACEI/ARB and Statin medications and encouraged to continue to follow up with Ophthalmology, Podiatrist at least yearly or according to recommendations, and advised to   stay away from smoking. I have recommended yearly flu vaccine and pneumonia vaccination at least every 5 years; moderate intensity exercise for up to 150 minutes weekly; and  sleep for at least 7 hours  a day.  - 60 minutes of time was spent on the care of this patient , 50% of which was applied for counseling on diabetes complications and their preventions.  - Patient to bring meter and  blood glucose logs during her next visit.   - I advised patient to maintain close follow up with Deloria Lair., MD for primary care needs.  Follow up plan: - Return in about 1 week (around 11/11/2016) for follow up with meter and logs- no labs.  Glade Lloyd, MD Phone: [phone]  Fax: [phone]   11/04/2016, 1:52 PM

## 2016-11-04 NOTE — Patient Instructions (Signed)

## 2016-11-07 ENCOUNTER — Encounter (INDEPENDENT_AMBULATORY_CARE_PROVIDER_SITE_OTHER): Payer: Self-pay

## 2016-11-12 ENCOUNTER — Ambulatory Visit: Payer: Medicare Other | Admitting: "Endocrinology

## 2016-11-13 ENCOUNTER — Encounter: Payer: Self-pay | Admitting: "Endocrinology

## 2016-11-13 ENCOUNTER — Ambulatory Visit (INDEPENDENT_AMBULATORY_CARE_PROVIDER_SITE_OTHER): Payer: Medicare Other | Admitting: "Endocrinology

## 2016-11-13 VITALS — BP 138/87 | HR 76 | Ht 69.5 in | Wt 214.0 lb

## 2016-11-13 DIAGNOSIS — E6609 Other obesity due to excess calories: Secondary | ICD-10-CM

## 2016-11-13 DIAGNOSIS — E118 Type 2 diabetes mellitus with unspecified complications: Secondary | ICD-10-CM | POA: Diagnosis not present

## 2016-11-13 DIAGNOSIS — E7800 Pure hypercholesterolemia, unspecified: Secondary | ICD-10-CM

## 2016-11-13 DIAGNOSIS — I10 Essential (primary) hypertension: Secondary | ICD-10-CM | POA: Diagnosis not present

## 2016-11-13 DIAGNOSIS — Z6831 Body mass index (BMI) 31.0-31.9, adult: Secondary | ICD-10-CM

## 2016-11-13 DIAGNOSIS — E1165 Type 2 diabetes mellitus with hyperglycemia: Secondary | ICD-10-CM | POA: Diagnosis not present

## 2016-11-13 DIAGNOSIS — IMO0002 Reserved for concepts with insufficient information to code with codable children: Secondary | ICD-10-CM

## 2016-11-13 MED ORDER — INSULIN DEGLUDEC 100 UNIT/ML ~~LOC~~ SOPN: 20.0000 [IU] | PEN_INJECTOR | Freq: Every day | SUBCUTANEOUS | 2 refills | Status: DC

## 2016-11-13 MED ORDER — INSULIN PEN NEEDLE 31G X 8 MM MISC: 1.0000 | 3 refills | Status: AC

## 2016-11-13 NOTE — Patient Instructions (Signed)

## 2016-11-13 NOTE — Progress Notes (Signed)
Subjective:    Patient ID: Christine Garrett, female    DOB: [birth year]. Patient is being seen in consultation for management of diabetes requested by  Deloria Lair., MD  Past Medical History:  Diagnosis Date  . Arthritis    knees  . Carpal tunnel syndrome   . CHF (congestive heart failure) ([place])   . Diabetes mellitus    type 2  . Heart murmur    "Little, No concerns" per Dr  Dannielle Burn pt reported.  . Hypertension    controlled using a guided approch with plasma renin activity  . Neuropathy   . Peripheral vascular disease [facility])    Past Surgical History:  Procedure Laterality Date  . [place]  . CERVICAL CONE BIOPSY     cervical lesion  . lipoma removal     right shoulder 2001  . MULTIPLE EXTRACTIONS WITH ALVEOLOPLASTY Bilateral 08/24/2012   Procedure: MULTIPLE EXTRACTION WITH ALVEOLOPLASTY BIOPSY OF RIGHT AND LEFT MANDIBLE ;  Surgeon: Gae Bon, DDS;  Location: [place];  Service: Oral Surgery;  Laterality: Bilateral;  . ROTATOR CUFF REPAIR     right shoulder  . TUBAL LIGATION     Social History   Social History  . Marital status: Married    Spouse name: N/A  . Number of children: N/A  . Years of education: N/A   Social History Main Topics  . Smoking status: Former Smoker    Years: 6.00  . Smokeless tobacco: Never Used     Comment: quit summer 2013  . Alcohol use 3.6 oz/week    6 Cans of beer per week  . Drug use: No  . Sexual activity: Not Asked   Other Topics Concern  . None   Social History Narrative  . None   Outpatient Encounter Prescriptions as of 11/13/2016  Medication Sig  . atorvastatin (LIPITOR) 40 MG tablet Take 40 mg by mouth daily.  . chlorthalidone (HYGROTON) 50 MG tablet Take 50 mg by mouth daily.  Marland Kitchen diltiazem (TIAZAC) 300 MG 24 hr capsule Take 300 mg by mouth daily.  . enalapril (VASOTEC) 20 MG tablet Take 20 mg by mouth 2 (two) times daily.   . insulin degludec (TRESIBA FLEXTOUCH) 100 UNIT/ML SOPN FlexTouch Pen Inject 0.2 mLs  (20 Units total) into the skin daily at 10 pm.  . Insulin Pen Needle (B-D ULTRAFINE III SHORT PEN) 31G X 8 MM MISC 1 each by Does not apply route as directed.  . labetalol (NORMODYNE) 200 MG tablet Take 400 mg by mouth 2 (two) times daily.  . metFORMIN (GLUCOPHAGE) 1000 MG tablet Take 1,000 mg by mouth 2 (two) times daily with a meal.  . oxyCODONE-acetaminophen (PERCOCET) 5-325 MG per tablet Take 1-2 tablets by mouth every 4 (four) hours as needed for pain.  . pantoprazole (PROTONIX) 40 MG tablet Take 40 mg by mouth daily.  . traMADol (ULTRAM) 50 MG tablet TAKE 1 TABLET BY MOUTH EVERY 4 HOURS AS NEEDED FOR 30 DAYS   No facility-administered encounter medications on file as of 11/13/2016.    ALLERGIES: No Known Allergies VACCINATION STATUS:  There is no immunization history on file for this patient.  Diabetes  She presents for her follow-up diabetic visit. She has type 2 diabetes mellitus. Onset time: She was diagnosed at approximate age of 25 years. Her disease course has been worsening. There are no hypoglycemic associated symptoms. Pertinent negatives for hypoglycemia include no confusion, headaches, pallor or seizures. Associated  symptoms include blurred vision, fatigue, polyphagia and polyuria. Pertinent negatives for diabetes include no chest pain and no polydipsia. There are no hypoglycemic complications. Symptoms are worsening. Diabetic complications include peripheral neuropathy. Risk factors for coronary artery disease include dyslipidemia, diabetes mellitus, family history, hypertension, sedentary lifestyle and tobacco exposure. Current diabetic treatment includes oral agent (monotherapy). Her weight is decreasing steadily. She is following a generally unhealthy diet. When asked about meal planning, she reported none. She has not had a previous visit with a dietitian. She never participates in exercise. Her home blood glucose trend is increasing steadily. Her breakfast blood glucose range  is generally >200 mg/dl. Her lunch blood glucose range is generally >200 mg/dl. Her dinner blood glucose range is generally >200 mg/dl. Her overall blood glucose range is >200 mg/dl. An ACE inhibitor/angiotensin II receptor blocker is being taken.  Hyperlipidemia  This is a chronic problem. The current episode started more than 1 year ago. The problem is uncontrolled. Exacerbating diseases include diabetes and obesity. Pertinent negatives include no chest pain, myalgias or shortness of breath. Current antihyperlipidemic treatment includes statins. Risk factors for coronary artery disease include diabetes mellitus, dyslipidemia, hypertension, obesity, a sedentary lifestyle and post-menopausal.  Hypertension  This is a chronic problem. The current episode started more than 1 year ago. The problem is uncontrolled. Associated symptoms include blurred vision. Pertinent negatives include no chest pain, headaches, palpitations or shortness of breath. Risk factors for coronary artery disease include diabetes mellitus, dyslipidemia, obesity, post-menopausal state, sedentary lifestyle, smoking/tobacco exposure and family history. Past treatments include ACE inhibitors.     Review of Systems  Constitutional: Positive for fatigue. Negative for chills, fever and unexpected weight change.  HENT: Negative for trouble swallowing and voice change.   Eyes: Positive for blurred vision. Negative for visual disturbance.  Respiratory: Negative for cough, shortness of breath and wheezing.   Cardiovascular: Negative for chest pain, palpitations and leg swelling.  Gastrointestinal: Negative for diarrhea, nausea and vomiting.  Endocrine: Positive for polyphagia and polyuria. Negative for cold intolerance, heat intolerance and polydipsia.  Musculoskeletal: Negative for arthralgias and myalgias.  Skin: Negative for color change, pallor, rash and wound.  Neurological: Negative for seizures and headaches.   Psychiatric/Behavioral: Negative for confusion and suicidal ideas.    Objective:    BP 138/87   Pulse 76   Ht 5' 9.5" (1.765 m)   Wt 214 lb (97.1 kg)   BMI 31.15 kg/m   Wt Readings from Last 3 Encounters:  11/13/16 214 lb (97.1 kg)  11/04/16 213 lb (96.6 kg)  10/30/16 212 lb 14.4 oz (96.6 kg)    Physical Exam  Constitutional: She is oriented to person, place, and time. She appears well-developed.  HENT:  Head: Normocephalic and atraumatic.  Eyes: EOM are normal.  Neck: Normal range of motion. Neck supple. No tracheal deviation present. No thyromegaly present.  Cardiovascular: Normal rate and regular rhythm.   Pulmonary/Chest: Effort normal and breath sounds normal.  Abdominal: Soft. Bowel sounds are normal. There is no tenderness. There is no guarding.  Musculoskeletal: Normal range of motion. She exhibits no edema.  Neurological: She is alert and oriented to person, place, and time. She has normal reflexes. No cranial nerve deficit. Coordination normal.  Skin: Skin is warm and dry. No rash noted. No erythema. No pallor.  Psychiatric: She has a normal mood and affect. Judgment normal.    CMP     Component Value Date/Time   NA 134 (L) 08/18/2012 1102   K 3.5  08/18/2012 1102   CL 95 (L) 08/18/2012 1102   CO2 28 08/18/2012 1102   GLUCOSE 405 (H) 08/18/2012 1102   BUN 12 08/18/2012 1102   CREATININE 0.82 08/18/2012 1102   CALCIUM 10.4 08/18/2012 1102   GFRNONAA 80 (L) 08/18/2012 1102   GFRAA >90 08/18/2012 1102   Diabetic Labs (most recent): Lab Results  Component Value Date   HGBA1C 12.7 08/19/2016     Lipid Panel ( most recent) Lipid Panel     Component Value Date/Time   CHOL 152 08/19/2016   TRIG 179 (A) 08/19/2016   HDL 29 (A) 08/19/2016   LDLCALC 87 08/19/2016      Assessment & Plan:   1. Uncontrolled type 2 diabetes mellitus with complication, without long-term current use of insulin ([place])  -She came with persistently above target blood glucose  profile between 400-530 mg/dl. - Patient has currently uncontrolled symptomatic type 2 DM since  61 years of age,  with most recent A1c of 12.7 %. Recent labs reviewed.   Her diabetes is complicated by obesity/sedentary life, and patient remains at a high risk for more acute and chronic complications of diabetes which include CAD, CVA, CKD, retinopathy, and neuropathy. These are all discussed in detail with the patient.  - I have counseled the patient on diet management and weight loss, by adopting a carbohydrate restricted/protein rich diet.  - Suggestion is made for patient to avoid simple carbohydrates   from their diet including Cakes , Desserts, Ice Cream,  Soda (  diet and regular) , Sweet Tea , Candies,  Chips, Cookies, Artificial Sweeteners,   and "Sugar-free" Products . This will help patient to have stable blood glucose profile and potentially avoid unintended weight gain.  - I encouraged the patient to switch to  unprocessed or minimally processed complex starch and increased protein intake (animal or plant source), fruits, and vegetables.  - Patient is advised to stick to a routine mealtimes to eat 3 meals  a day and avoid unnecessary snacks ( to snack only to correct hypoglycemia).  - The patient will be scheduled with Jearld Fenton, RDN, CDE for individualized DM education.  - I have approached patient with the following individualized plan to manage diabetes and patient agrees:   -  Given her current glycemic burden, she will need basal/bolus insulin to treat diabetes to target.  - I discussed and started basal insulin Tresiba 20 units daily at bedtime, it is essential to assure her commitment for proper monitoring blood glucose for safe use of insulin. -Accordingly, I urged her to continue strict monitoring blood glucose 4 times a day-before meals and at bedtime and return in one week with her meter and logs to review . -Patient is encouraged to call clinic for blood glucose  levels less than 70 or above 300 mg /dl. - I will continue metformin 1000 g by mouth twice a day, therapeutically suitable for patient.  - Patient will be considered for incretin therapy as appropriate next visit. - Patient specific target  A1c;  LDL, HDL, Triglycerides, and  Waist Circumference were discussed in detail.  2) BP/HTN: controlled. She has enough medications for hypertension. I advised her to be consistent on her medications which would include labetalol 200 mg by mouth twice a day, enalapril 20 mg by mouth twice a day, diltiazem 300 mg by mouth daily, chlorthalidone 50 mg by mouth daily.  3) Lipids/HPL:   Uncontrolled, LDL 87 triglyceride 179.   Patient is advised to continue  Lipitor 40 mg by mouth at bedtime.  4)  Weight/Diet: CDE Consult has been initiated , exercise, and detailed carbohydrates information provided.  5) Chronic Care/Health Maintenance:  -Patient is on ACEI/ARB and Statin medications and encouraged to continue to follow up with Ophthalmology, Podiatrist at least yearly or according to recommendations, and advised to   stay away from smoking. I have recommended yearly flu vaccine and pneumonia vaccination at least every 5 years; moderate intensity exercise for up to 150 minutes weekly; and  sleep for at least 7 hours a day.  - 30 minutes of time was spent on the care of this patient , 50% of which was applied for counseling on diabetes complications and their preventions.  - Patient to bring meter and  blood glucose logs during her next visit.   - I advised patient to maintain close follow up with Deloria Lair., MD for primary care needs.  Follow up plan: - Return in about 1 week (around 11/20/2016) for follow up with meter and logs- no labs.  Glade Lloyd, MD Phone: [phone]  Fax: [phone]   11/13/2016, 1:01 PM

## 2016-11-19 NOTE — Patient Instructions (Signed)
Christine Garrett  11/19/2016     @[email or web address] @   Your procedure is scheduled on  11/28/2016   Report to [facility] at  800  A.M.  Call this number if you have problems the morning of surgery:  [phone]   Remember:  Do not eat food or drink liquids after midnight.  Take these medicines the morning of surgery with A SIP OF WATER  Diltiazem, vasotec, labetolol, oxycodone, protonix, ultram. Take 1/2 of your usual insulin dosage the night before your procedure. DO NOT take any medications for diabetes the morning of your procedure.   Do not wear jewelry, make-up or nail polish.  Do not wear lotions, powders, or perfumes, or deoderant.  Do not shave 48 hours prior to surgery.  Men may shave face and neck.  Do not bring valuables to the hospital.  [facility] is not responsible for any belongings or valuables.  Contacts, dentures or bridgework may not be worn into surgery.  Leave your suitcase in the car.  After surgery it may be brought to your room.  For patients admitted to the hospital, discharge time will be determined by your treatment team.  Patients discharged the day of surgery will not be allowed to drive home.   Name and phone number of your driver:   family Special instructions:  Follow the diet instructions given to you by Dr Olevia Perches office.  Please read over the following fact sheets that you were given. Anesthesia Post-op Instructions and Care and Recovery After Surgery       Esophagogastroduodenoscopy Esophagogastroduodenoscopy (EGD) is a procedure to examine the lining of the esophagus, stomach, and first part of the small intestine (duodenum). This procedure is done to check for problems such as inflammation, bleeding, ulcers, or growths. During this procedure, a long, flexible, lighted tube with a camera attached (endoscope) is inserted down the throat. Tell a health care provider about:  Any allergies you have.  All medicines you  are taking, including vitamins, herbs, eye drops, creams, and over-the-counter medicines.  Any problems you or family members have had with anesthetic medicines.  Any blood disorders you have.  Any surgeries you have had.  Any medical conditions you have.  Whether you are pregnant or may be pregnant. What are the risks? Generally, this is a safe procedure. However, problems may occur, including:  Infection.  Bleeding.  A tear (perforation) in the esophagus, stomach, or duodenum.  Trouble breathing.  Excessive sweating.  Spasms of the larynx.  A slowed heartbeat.  Low blood pressure.  What happens before the procedure?  Follow instructions from your health care provider about eating or drinking restrictions.  Ask your health care provider about: ? Changing or stopping your regular medicines. This is especially important if you are taking diabetes medicines or blood thinners. ? Taking medicines such as aspirin and ibuprofen. These medicines can thin your blood. Do not take these medicines before your procedure if your health care provider instructs you not to.  Plan to have someone take you home after the procedure.  If you wear dentures, be ready to remove them before the procedure. What happens during the procedure?  To reduce your risk of infection, your health care team will wash or sanitize their hands.  An IV tube will be put in a vein in your hand or arm. You will get medicines and fluids through this tube.  You will be given one or more  of the following: ? A medicine to help you relax (sedative). ? A medicine to numb the area (local anesthetic). This medicine may be sprayed into your throat. It will make you feel more comfortable and keep you from gagging or coughing during the procedure. ? A medicine for pain.  A mouth guard may be placed in your mouth to protect your teeth and to keep you from biting on the endoscope.  You will be asked to lie on your  left side.  The endoscope will be lowered down your throat into your esophagus, stomach, and duodenum.  Air will be put into the endoscope. This will help your health care provider see better.  The lining of your esophagus, stomach, and duodenum will be examined.  Your health care provider may: ? Take a tissue sample so it can be looked at in a lab (biopsy). ? Remove growths. ? Remove objects (foreign bodies) that are stuck. ? Treat any bleeding with medicines or other devices that stop tissue from bleeding. ? Widen (dilate) or stretch narrowed areas of your esophagus and stomach.  The endoscope will be taken out. The procedure may vary among health care providers and hospitals. What happens after the procedure?  Your blood pressure, heart rate, breathing rate, and blood oxygen level will be monitored often until the medicines you were given have worn off.  Do not eat or drink anything until the numbing medicine has worn off and your gag reflex has returned. This information is not intended to replace advice given to you by your health care provider. Make sure you discuss any questions you have with your health care provider. Document Released: 10/04/2004 Document Revised: 11/09/2015 Document Reviewed: 04/27/2015 Elsevier Interactive Patient Education  2018 [employer]. Esophagogastroduodenoscopy, Care After Refer to this sheet in the next few weeks. These instructions provide you with information about caring for yourself after your procedure. Your health care provider may also give you more specific instructions. Your treatment has been planned according to current medical practices, but problems sometimes occur. Call your health care provider if you have any problems or questions after your procedure. What can I expect after the procedure? After the procedure, it is common to have:  A sore throat.  Nausea.  Bloating.  Dizziness.  Fatigue.  Follow these instructions at  home:  Do not eat or drink anything until the numbing medicine (local anesthetic) has worn off and your gag reflex has returned. You will know that the local anesthetic has worn off when you can swallow comfortably.  Do not drive for 24 hours if you received a medicine to help you relax (sedative).  If your health care provider took a tissue sample for testing during the procedure, make sure to get your test results. This is your responsibility. Ask your health care provider or the department performing the test when your results will be ready.  Keep all follow-up visits as told by your health care provider. This is important. Contact a health care provider if:  You cannot stop coughing.  You are not urinating.  You are urinating less than usual. Get help right away if:  You have trouble swallowing.  You cannot eat or drink.  You have throat or chest pain that gets worse.  You are dizzy or light-headed.  You faint.  You have nausea or vomiting.  You have chills.  You have a fever.  You have severe abdominal pain.  You have black, tarry, or bloody stools. This information  is not intended to replace advice given to you by your health care provider. Make sure you discuss any questions you have with your health care provider. Document Released: 05/20/2012 Document Revised: 11/09/2015 Document Reviewed: 04/27/2015 Elsevier Interactive Patient Education  2018 [place] Anesthesia is a term that refers to techniques, procedures, and medicines that help a person stay safe and comfortable during a medical procedure. Monitored anesthesia care, or sedation, is one type of anesthesia. Your anesthesia specialist may recommend sedation if you will be having a procedure that does not require you to be unconscious, such as:  Cataract surgery.  A dental procedure.  A biopsy.  A colonoscopy.  During the procedure, you may receive a medicine to help  you relax (sedative). There are three levels of sedation:  Mild sedation. At this level, you may feel awake and relaxed. You will be able to follow directions.  Moderate sedation. At this level, you will be sleepy. You may not remember the procedure.  Deep sedation. At this level, you will be asleep. You will not remember the procedure.  The more medicine you are given, the deeper your level of sedation will be. Depending on how you respond to the procedure, the anesthesia specialist may change your level of sedation or the type of anesthesia to fit your needs. An anesthesia specialist will monitor you closely during the procedure. Let your health care provider know about:  Any allergies you have.  All medicines you are taking, including vitamins, herbs, eye drops, creams, and over-the-counter medicines.  Any use of steroids (by mouth or as a cream).  Any problems you or family members have had with sedatives and anesthetic medicines.  Any blood disorders you have.  Any surgeries you have had.  Any medical conditions you have, such as sleep apnea.  Whether you are pregnant or may be pregnant.  Any use of cigarettes, alcohol, or street drugs. What are the risks? Generally, this is a safe procedure. However, problems may occur, including:  Getting too much medicine (oversedation).  Nausea.  Allergic reaction to medicines.  Trouble breathing. If this happens, a breathing tube may be used to help with breathing. It will be removed when you are awake and breathing on your own.  Heart trouble.  Lung trouble.  Before the procedure Staying hydrated Follow instructions from your health care provider about hydration, which may include:  Up to 2 hours before the procedure - you may continue to drink clear liquids, such as water, clear fruit juice, black coffee, and plain tea.  Eating and drinking restrictions Follow instructions from your health care provider about eating and  drinking, which may include:  8 hours before the procedure - stop eating heavy meals or foods such as meat, fried foods, or fatty foods.  6 hours before the procedure - stop eating light meals or foods, such as toast or cereal.  6 hours before the procedure - stop drinking milk or drinks that contain milk.  2 hours before the procedure - stop drinking clear liquids.  Medicines Ask your health care provider about:  Changing or stopping your regular medicines. This is especially important if you are taking diabetes medicines or blood thinners.  Taking medicines such as aspirin and ibuprofen. These medicines can thin your blood. Do not take these medicines before your procedure if your health care provider instructs you not to.  Tests and exams  You will have a physical exam.  You may have blood  tests done to show: ? How well your kidneys and liver are working. ? How well your blood can clot.  General instructions  Plan to have someone take you home from the hospital or clinic.  If you will be going home right after the procedure, plan to have someone with you for 24 hours.  What happens during the procedure?  Your blood pressure, heart rate, breathing, level of pain and overall condition will be monitored.  An IV tube will be inserted into one of your veins.  Your anesthesia specialist will give you medicines as needed to keep you comfortable during the procedure. This may mean changing the level of sedation.  The procedure will be performed. After the procedure  Your blood pressure, heart rate, breathing rate, and blood oxygen level will be monitored until the medicines you were given have worn off.  Do not drive for 24 hours if you received a sedative.  You may: ? Feel sleepy, clumsy, or nauseous. ? Feel forgetful about what happened after the procedure. ? Have a sore throat if you had a breathing tube during the procedure. ? Vomit. This information is not intended  to replace advice given to you by your health care provider. Make sure you discuss any questions you have with your health care provider. Document Released: 02/27/2005 Document Revised: 11/10/2015 Document Reviewed: 09/24/2015 Elsevier Interactive Patient Education  2018 [place], Care After These instructions provide you with information about caring for yourself after your procedure. Your health care provider may also give you more specific instructions. Your treatment has been planned according to current medical practices, but problems sometimes occur. Call your health care provider if you have any problems or questions after your procedure. What can I expect after the procedure? After your procedure, it is common to:  Feel sleepy for several hours.  Feel clumsy and have poor balance for several hours.  Feel forgetful about what happened after the procedure.  Have poor judgment for several hours.  Feel nauseous or vomit.  Have a sore throat if you had a breathing tube during the procedure.  Follow these instructions at home: For at least 24 hours after the procedure:   Do not: ? Participate in activities in which you could fall or become injured. ? Drive. ? Use heavy machinery. ? Drink alcohol. ? Take sleeping pills or medicines that cause drowsiness. ? Make important decisions or sign legal documents. ? Take care of children on your own.  Rest. Eating and drinking  Follow the diet that is recommended by your health care provider.  If you vomit, drink water, juice, or soup when you can drink without vomiting.  Make sure you have little or no nausea before eating solid foods. General instructions  Have a responsible adult stay with you until you are awake and alert.  Take over-the-counter and prescription medicines only as told by your health care provider.  If you smoke, do not smoke without supervision.  Keep all follow-up visits  as told by your health care provider. This is important. Contact a health care provider if:  You keep feeling nauseous or you keep vomiting.  You feel light-headed.  You develop a rash.  You have a fever. Get help right away if:  You have trouble breathing. This information is not intended to replace advice given to you by your health care provider. Make sure you discuss any questions you have with your health care provider. Document Released: 09/24/2015 Document  Revised: 01/24/2016 Document Reviewed: 09/24/2015 Elsevier Interactive Patient Education  [employer].

## 2016-11-20 ENCOUNTER — Ambulatory Visit (INDEPENDENT_AMBULATORY_CARE_PROVIDER_SITE_OTHER): Payer: Medicare Other | Admitting: "Endocrinology

## 2016-11-20 ENCOUNTER — Encounter: Payer: Self-pay | Admitting: "Endocrinology

## 2016-11-20 VITALS — BP 144/90 | HR 102 | Ht 69.5 in | Wt 211.0 lb

## 2016-11-20 DIAGNOSIS — E6609 Other obesity due to excess calories: Secondary | ICD-10-CM | POA: Diagnosis not present

## 2016-11-20 DIAGNOSIS — E118 Type 2 diabetes mellitus with unspecified complications: Secondary | ICD-10-CM | POA: Diagnosis not present

## 2016-11-20 DIAGNOSIS — E7800 Pure hypercholesterolemia, unspecified: Secondary | ICD-10-CM | POA: Diagnosis not present

## 2016-11-20 DIAGNOSIS — E1165 Type 2 diabetes mellitus with hyperglycemia: Secondary | ICD-10-CM

## 2016-11-20 DIAGNOSIS — I10 Essential (primary) hypertension: Secondary | ICD-10-CM | POA: Diagnosis not present

## 2016-11-20 DIAGNOSIS — Z6831 Body mass index (BMI) 31.0-31.9, adult: Secondary | ICD-10-CM

## 2016-11-20 DIAGNOSIS — IMO0002 Reserved for concepts with insufficient information to code with codable children: Secondary | ICD-10-CM

## 2016-11-20 MED ORDER — INSULIN DEGLUDEC 100 UNIT/ML ~~LOC~~ SOPN: 50.0000 [IU] | PEN_INJECTOR | Freq: Every day | SUBCUTANEOUS | 2 refills | Status: AC

## 2016-11-20 NOTE — Progress Notes (Signed)
Subjective:    Patient ID: Christine Garrett, female    DOB: January 12, 1956. Patient is being seen in f/u  for management of diabetes requested by  Deloria Lair., MD  Past Medical History:  Diagnosis Date  . Arthritis    knees  . Carpal tunnel syndrome   . CHF (congestive heart failure) ([place])   . Diabetes mellitus    type 2  . Heart murmur    "Little, No concerns" per Dr  Dannielle Burn pt reported.  . Hypertension    controlled using a guided approch with plasma renin activity  . Neuropathy   . Peripheral vascular disease [facility])    Past Surgical History:  Procedure Laterality Date  . [place]  . CERVICAL CONE BIOPSY     cervical lesion  . lipoma removal     right shoulder 2001  . MULTIPLE EXTRACTIONS WITH ALVEOLOPLASTY Bilateral 08/24/2012   Procedure: MULTIPLE EXTRACTION WITH ALVEOLOPLASTY BIOPSY OF RIGHT AND LEFT MANDIBLE ;  Surgeon: Gae Bon, DDS;  Location: [place];  Service: Oral Surgery;  Laterality: Bilateral;  . ROTATOR CUFF REPAIR     right shoulder  . TUBAL LIGATION     Social History   Social History  . Marital status: Married    Spouse name: N/A  . Number of children: N/A  . Years of education: N/A   Social History Main Topics  . Smoking status: Former Smoker    Years: 6.00  . Smokeless tobacco: Never Used     Comment: quit summer 2013  . Alcohol use 3.6 oz/week    6 Cans of beer per week  . Drug use: No  . Sexual activity: Not Asked   Other Topics Concern  . None   Social History Narrative  . None   Outpatient Encounter Prescriptions as of 11/20/2016  Medication Sig  . atorvastatin (LIPITOR) 40 MG tablet Take 40 mg by mouth daily.  . chlorthalidone (HYGROTON) 50 MG tablet Take 50 mg by mouth daily.  Marland Kitchen diltiazem (TIAZAC) 300 MG 24 hr capsule Take 300 mg by mouth daily.  . enalapril (VASOTEC) 20 MG tablet Take 20 mg by mouth 2 (two) times daily.   . insulin degludec (TRESIBA FLEXTOUCH) 100 UNIT/ML SOPN FlexTouch Pen Inject 0.2 mLs (20 Units  total) into the skin daily at 10 pm.  . Insulin Pen Needle (B-D ULTRAFINE III SHORT PEN) 31G X 8 MM MISC 1 each by Does not apply route as directed.  . labetalol (NORMODYNE) 200 MG tablet Take 400 mg by mouth 2 (two) times daily.  . metFORMIN (GLUCOPHAGE) 1000 MG tablet Take 1,000 mg by mouth 2 (two) times daily with a meal.  . oxyCODONE-acetaminophen (PERCOCET) 5-325 MG per tablet Take 1-2 tablets by mouth every 4 (four) hours as needed for pain.  . pantoprazole (PROTONIX) 40 MG tablet Take 40 mg by mouth daily.  . traMADol (ULTRAM) 50 MG tablet TAKE 1 TABLET BY MOUTH EVERY 4 HOURS AS NEEDED FOR 30 DAYS   No facility-administered encounter medications on file as of 11/20/2016.    ALLERGIES: No Known Allergies VACCINATION STATUS:  There is no immunization history on file for this patient.  Diabetes  She presents for her follow-up diabetic visit. She has type 2 diabetes mellitus. Onset time: She was diagnosed at approximate age of 61 years. Her disease course has been worsening. There are no hypoglycemic associated symptoms. Pertinent negatives for hypoglycemia include no confusion, headaches, pallor or seizures.  Associated symptoms include blurred vision, fatigue, polyphagia and polyuria. Pertinent negatives for diabetes include no chest pain and no polydipsia. There are no hypoglycemic complications. Symptoms are worsening. Diabetic complications include peripheral neuropathy. Risk factors for coronary artery disease include dyslipidemia, diabetes mellitus, family history, hypertension, sedentary lifestyle and tobacco exposure. Current diabetic treatment includes oral agent (monotherapy). Her weight is decreasing steadily. She is following a generally unhealthy diet. When asked about meal planning, she reported none. She has not had a previous visit with a dietitian. She never participates in exercise. Her home blood glucose trend is increasing steadily. Her breakfast blood glucose range is generally  >200 mg/dl. Her lunch blood glucose range is generally >200 mg/dl. Her dinner blood glucose range is generally >200 mg/dl. Her overall blood glucose range is >200 mg/dl. An ACE inhibitor/angiotensin II receptor blocker is being taken.  Hyperlipidemia  This is a chronic problem. The current episode started more than 1 year ago. The problem is uncontrolled. Exacerbating diseases include diabetes and obesity. Pertinent negatives include no chest pain, myalgias or shortness of breath. Current antihyperlipidemic treatment includes statins. Risk factors for coronary artery disease include diabetes mellitus, dyslipidemia, hypertension, obesity, a sedentary lifestyle and post-menopausal.  Hypertension  This is a chronic problem. The current episode started more than 1 year ago. The problem is uncontrolled. Associated symptoms include blurred vision. Pertinent negatives include no chest pain, headaches, palpitations or shortness of breath. Risk factors for coronary artery disease include diabetes mellitus, dyslipidemia, obesity, post-menopausal state, sedentary lifestyle, smoking/tobacco exposure and family history. Past treatments include ACE inhibitors.     Review of Systems  Constitutional: Positive for fatigue. Negative for chills, fever and unexpected weight change.  HENT: Negative for trouble swallowing and voice change.   Eyes: Positive for blurred vision. Negative for visual disturbance.  Respiratory: Negative for cough, shortness of breath and wheezing.   Cardiovascular: Negative for chest pain, palpitations and leg swelling.  Gastrointestinal: Negative for diarrhea, nausea and vomiting.  Endocrine: Positive for polyphagia and polyuria. Negative for cold intolerance, heat intolerance and polydipsia.  Musculoskeletal: Negative for arthralgias and myalgias.  Skin: Negative for color change, pallor, rash and wound.  Neurological: Negative for seizures and headaches.  Psychiatric/Behavioral: Negative  for confusion and suicidal ideas.    Objective:    BP (!) 144/90   Pulse (!) 102   Ht 5' 9.5" (1.765 m)   Wt 211 lb (95.7 kg)   BMI 30.71 kg/m   Wt Readings from Last 3 Encounters:  11/20/16 211 lb (95.7 kg)  11/13/16 214 lb (97.1 kg)  11/04/16 213 lb (96.6 kg)    Physical Exam  Constitutional: She is oriented to person, place, and time. She appears well-developed.  HENT:  Head: Normocephalic and atraumatic.  Eyes: EOM are normal.  Neck: Normal range of motion. Neck supple. No tracheal deviation present. No thyromegaly present.  Cardiovascular: Normal rate and regular rhythm.   Pulmonary/Chest: Effort normal and breath sounds normal.  Abdominal: Soft. Bowel sounds are normal. There is no tenderness. There is no guarding.  Musculoskeletal: Normal range of motion. She exhibits no edema.  Neurological: She is alert and oriented to person, place, and time. She has normal reflexes. No cranial nerve deficit. Coordination normal.  Skin: Skin is warm and dry. No rash noted. No erythema. No pallor.  Psychiatric: She has a normal mood and affect. Judgment normal.    CMP     Component Value Date/Time   NA 134 (L) 08/18/2012 1102   K  3.5 08/18/2012 1102   CL 95 (L) 08/18/2012 1102   CO2 28 08/18/2012 1102   GLUCOSE 405 (H) 08/18/2012 1102   BUN 12 08/18/2012 1102   CREATININE 0.82 08/18/2012 1102   CALCIUM 10.4 08/18/2012 1102   GFRNONAA 80 (L) 08/18/2012 1102   GFRAA >90 08/18/2012 1102   Diabetic Labs (most recent): Lab Results  Component Value Date   HGBA1C 12.7 08/19/2016     Lipid Panel ( most recent) Lipid Panel     Component Value Date/Time   CHOL 152 08/19/2016   TRIG 179 (A) 08/19/2016   HDL 29 (A) 08/19/2016   LDLCALC 87 08/19/2016      Assessment & Plan:   1. Uncontrolled type 2 diabetes mellitus with complication, without long-term current use of insulin ([place])  -She came with persistently above target blood glucose profile between 400-530 mg/dl. -  Patient has currently uncontrolled symptomatic type 2 DM since  61 years of age,  with most recent A1c of 12.7 %. Recent labs reviewed.   Her diabetes is complicated by obesity/sedentary life, and patient remains at a high risk for more acute and chronic complications of diabetes which include CAD, CVA, CKD, retinopathy, and neuropathy. These are all discussed in detail with the patient.  - I have counseled the patient on diet management and weight loss, by adopting a carbohydrate restricted/protein rich diet.  - Suggestion is made for patient to avoid simple carbohydrates   from their diet including Cakes , Desserts, Ice Cream,  Soda (  diet and regular) , Sweet Tea , Candies,  Chips, Cookies, Artificial Sweeteners,   and "Sugar-free" Products . This will help patient to have stable blood glucose profile and potentially avoid unintended weight gain.  - I encouraged the patient to switch to  unprocessed or minimally processed complex starch and increased protein intake (animal or plant source), fruits, and vegetables.  - Patient is advised to stick to a routine mealtimes to eat 3 meals  a day and avoid unnecessary snacks ( to snack only to correct hypoglycemia).  - The patient will be scheduled with Jearld Fenton, RDN, CDE for individualized DM education.  - I have approached patient with the following individualized plan to manage diabetes and patient agrees:   -  Given her current glycemic burden, she will need basal/bolus insulin to treat diabetes to target.  -  She was started on basal insulin Tresiba 20 units daily at bedtime during her last visit. - She returns with persistently elevated blood glucose profile monitoring 1- 3 times daily. - She still didn't show appropriate commitment for proper monitoring of blood glucose for safe use of insulin. -Accordingly, I urged her to talk to  strict monitoring blood glucose 4 times a day-before meals and at bedtime and return in one week with her  meter and logs to review . - In the meantime, I will increase her insulin to 50 units daily at bedtime. -Patient is encouraged to call clinic for blood glucose levels less than 70 or above 300 mg /dl. - I will continue metformin 1000 g by mouth twice a day, therapeutically suitable for patient.  - Patient will be considered for incretin therapy as appropriate next visit. - Patient specific target  A1c;  LDL, HDL, Triglycerides, and  Waist Circumference were discussed in detail.  2) BP/HTN: uncontrolled. She has enough medications for hypertension. I advised her to be consistent on her medications which would include labetalol 200 mg by mouth twice a  day, enalapril 20 mg by mouth twice a day, diltiazem 300 mg by mouth daily, chlorthalidone 50 mg by mouth daily.  3) Lipids/HPL:   Uncontrolled, LDL 87 triglyceride 179.   Patient is advised to continue Lipitor 40 mg by mouth at bedtime.  4)  Weight/Diet: CDE Consult has been initiated , exercise, and detailed carbohydrates information provided.  5) Chronic Care/Health Maintenance:  -Patient is on ACEI/ARB and Statin medications and encouraged to continue to follow up with Ophthalmology, Podiatrist at least yearly or according to recommendations, and advised to   stay away from smoking. I have recommended yearly flu vaccine and pneumonia vaccination at least every 5 years; moderate intensity exercise for up to 150 minutes weekly; and  sleep for at least 7 hours a day.  - 30 minutes of time was spent on the care of this patient , 50% of which was applied for counseling on diabetes complications and their preventions.  - Patient to bring meter and  blood glucose logs during her next visit.   - I advised patient to maintain close follow up with Deloria Lair., MD for primary care needs.  Follow up plan: - Return in about 1 week (around 11/27/2016) for follow up with meter and logs- no labs.  Glade Lloyd, MD Phone: [phone]  Fax: [phone]    11/20/2016, 1:47 PM

## 2016-11-20 NOTE — Patient Instructions (Signed)

## 2016-11-22 ENCOUNTER — Other Ambulatory Visit: Payer: Self-pay

## 2016-11-22 ENCOUNTER — Encounter (HOSPITAL_COMMUNITY)
Admission: RE | Admit: 2016-11-22 | Discharge: 2016-11-22 | Disposition: A | Discharge status: Home / Self Care | Payer: Medicare Other | Source: Ambulatory Visit | Attending: Internal Medicine | Admitting: Internal Medicine

## 2016-11-22 ENCOUNTER — Encounter (HOSPITAL_COMMUNITY): Payer: Self-pay

## 2016-11-22 DIAGNOSIS — Z01812 Encounter for preprocedural laboratory examination: Secondary | ICD-10-CM | POA: Insufficient documentation

## 2016-11-22 DIAGNOSIS — K219 Gastro-esophageal reflux disease without esophagitis: Secondary | ICD-10-CM | POA: Insufficient documentation

## 2016-11-22 DIAGNOSIS — Z01818 Encounter for other preprocedural examination: Secondary | ICD-10-CM | POA: Diagnosis not present

## 2016-11-22 DIAGNOSIS — R1013 Epigastric pain: Secondary | ICD-10-CM | POA: Insufficient documentation

## 2016-11-22 DIAGNOSIS — G8929 Other chronic pain: Secondary | ICD-10-CM | POA: Insufficient documentation

## 2016-11-22 HISTORY — DX: Sleep apnea, unspecified: G47.30

## 2016-11-22 HISTORY — DX: Gastro-esophageal reflux disease without esophagitis: K21.9

## 2016-11-22 LAB — BASIC METABOLIC PANEL
Anion gap: 12 (ref 5–15)
BUN: 11 mg/dL (ref 6–20)
CHLORIDE: 94 mmol/L — AB (ref 101–111)
CO2: 25 mmol/L (ref 22–32)
CREATININE: 0.86 mg/dL (ref 0.44–1.00)
Calcium: 9.5 mg/dL (ref 8.9–10.3)
GFR calc Af Amer: >60 mL/min (ref >60)
GFR calc non Af Amer: >60 mL/min (ref >60)
GLUCOSE: 324 mg/dL — AB (ref 65–99)
Potassium: 3.1 mmol/L — ABNORMAL LOW (ref 3.5–5.1)
Sodium: 131 mmol/L — ABNORMAL LOW (ref 135–145)

## 2016-11-22 LAB — CBC WITH DIFFERENTIAL/PLATELET
Basophils Absolute: 0 10*3/uL (ref 0.0–0.1)
Basophils Relative: 1 %
EOS ABS: 0.2 10*3/uL (ref 0.0–0.7)
Eosinophils Relative: 3 %
HEMATOCRIT: 35.4 % — AB (ref 36.0–46.0)
HEMOGLOBIN: 12.2 g/dL (ref 12.0–15.0)
LYMPHS ABS: 2.4 10*3/uL (ref 0.7–4.0)
LYMPHS PCT: 44 %
MCH: 31.5 pg (ref 26.0–34.0)
MCHC: 34.5 g/dL (ref 30.0–36.0)
MCV: 91.5 fL (ref 78.0–100.0)
MONOS PCT: 6 %
Monocytes Absolute: 0.3 10*3/uL (ref 0.1–1.0)
NEUTROS PCT: 46 %
Neutro Abs: 2.6 10*3/uL (ref 1.7–7.7)
Platelets: 270 10*3/uL (ref 150–400)
RBC: 3.87 MIL/uL (ref 3.87–5.11)
RDW: 12.4 % (ref 11.5–15.5)
WBC: 5.6 10*3/uL (ref 4.0–10.5)

## 2016-11-25 NOTE — Pre-Procedure Instructions (Signed)
Potassium of 3.1 and glucose of 324 shown to Dr Patsey Berthold. Will recheck CBG on arrival.

## 2016-11-27 ENCOUNTER — Encounter: Payer: Self-pay | Admitting: "Endocrinology

## 2016-11-27 ENCOUNTER — Ambulatory Visit: Payer: Medicare Other | Admitting: "Endocrinology

## 2016-11-28 ENCOUNTER — Encounter (HOSPITAL_COMMUNITY)
Admission: RE | Disposition: A | Discharge status: Home / Self Care | Payer: Self-pay | Source: Ambulatory Visit | Attending: Internal Medicine

## 2016-11-28 ENCOUNTER — Encounter (INDEPENDENT_AMBULATORY_CARE_PROVIDER_SITE_OTHER): Payer: Self-pay | Admitting: *Deleted

## 2016-11-28 ENCOUNTER — Ambulatory Visit (HOSPITAL_COMMUNITY)
Admission: RE | Admit: 2016-11-28 | Discharge: 2016-11-28 | Disposition: A | Discharge status: Home / Self Care | Payer: Medicare Other | Source: Ambulatory Visit | Attending: Internal Medicine | Admitting: Internal Medicine

## 2016-11-28 ENCOUNTER — Other Ambulatory Visit (INDEPENDENT_AMBULATORY_CARE_PROVIDER_SITE_OTHER): Payer: Self-pay | Admitting: Internal Medicine

## 2016-11-28 ENCOUNTER — Encounter (HOSPITAL_COMMUNITY): Payer: Self-pay | Admitting: *Deleted

## 2016-11-28 ENCOUNTER — Ambulatory Visit (HOSPITAL_COMMUNITY): Payer: Medicare Other | Admitting: Anesthesiology

## 2016-11-28 DIAGNOSIS — K3189 Other diseases of stomach and duodenum: Secondary | ICD-10-CM | POA: Insufficient documentation

## 2016-11-28 DIAGNOSIS — K2950 Unspecified chronic gastritis without bleeding: Secondary | ICD-10-CM | POA: Diagnosis not present

## 2016-11-28 DIAGNOSIS — I509 Heart failure, unspecified: Secondary | ICD-10-CM | POA: Diagnosis not present

## 2016-11-28 DIAGNOSIS — Z794 Long term (current) use of insulin: Secondary | ICD-10-CM | POA: Diagnosis not present

## 2016-11-28 DIAGNOSIS — B9681 Helicobacter pylori [H. pylori] as the cause of diseases classified elsewhere: Secondary | ICD-10-CM | POA: Diagnosis not present

## 2016-11-28 DIAGNOSIS — R1013 Epigastric pain: Principal | ICD-10-CM

## 2016-11-28 DIAGNOSIS — K219 Gastro-esophageal reflux disease without esophagitis: Secondary | ICD-10-CM | POA: Diagnosis not present

## 2016-11-28 DIAGNOSIS — E1151 Type 2 diabetes mellitus with diabetic peripheral angiopathy without gangrene: Secondary | ICD-10-CM | POA: Insufficient documentation

## 2016-11-28 DIAGNOSIS — I110 Hypertensive heart disease with heart failure: Secondary | ICD-10-CM | POA: Insufficient documentation

## 2016-11-28 DIAGNOSIS — R634 Abnormal weight loss: Secondary | ICD-10-CM | POA: Diagnosis not present

## 2016-11-28 DIAGNOSIS — Z87891 Personal history of nicotine dependence: Secondary | ICD-10-CM | POA: Insufficient documentation

## 2016-11-28 DIAGNOSIS — G4730 Sleep apnea, unspecified: Secondary | ICD-10-CM | POA: Insufficient documentation

## 2016-11-28 DIAGNOSIS — G8929 Other chronic pain: Secondary | ICD-10-CM

## 2016-11-28 DIAGNOSIS — M170 Bilateral primary osteoarthritis of knee: Secondary | ICD-10-CM | POA: Insufficient documentation

## 2016-11-28 HISTORY — PX: BIOPSY: SHX5522

## 2016-11-28 HISTORY — PX: ESOPHAGOGASTRODUODENOSCOPY (EGD) WITH PROPOFOL: SHX5813

## 2016-11-28 LAB — GLUCOSE, CAPILLARY
GLUCOSE-CAPILLARY: 336 mg/dL — AB (ref 65–99)
GLUCOSE-CAPILLARY: 261 mg/dL — AB (ref 65–99)
Glucose-Capillary: 328 mg/dL — ABNORMAL HIGH (ref 65–99)
Glucose-Capillary: 268 mg/dL — ABNORMAL HIGH (ref 65–99)

## 2016-11-28 SURGERY — ESOPHAGOGASTRODUODENOSCOPY (EGD) WITH PROPOFOL
Anesthesia: Monitor Anesthesia Care

## 2016-11-28 MED ORDER — FENTANYL CITRATE (PF) 100 MCG/2ML IJ SOLN
25.0000 ug | Freq: Once | INTRAMUSCULAR | Status: AC
  Administered 2016-11-28: 25 ug via INTRAVENOUS

## 2016-11-28 MED ORDER — INSULIN ASPART 100 UNIT/ML ~~LOC~~ SOLN: 5.0000 [IU] | Freq: Once | SUBCUTANEOUS | Status: DC

## 2016-11-28 MED ORDER — LACTATED RINGERS IV SOLN
INTRAVENOUS | Status: DC
  Administered 2016-11-28: 09:00:00 via INTRAVENOUS

## 2016-11-28 MED ORDER — FENTANYL CITRATE (PF) 100 MCG/2ML IJ SOLN
INTRAMUSCULAR | Status: AC
  Filled 2016-11-28: qty 2

## 2016-11-28 MED ORDER — GLYCOPYRROLATE 0.2 MG/ML IJ SOLN
INTRAMUSCULAR | Status: AC
  Filled 2016-11-28: qty 1

## 2016-11-28 MED ORDER — LIDOCAINE VISCOUS 2 % MT SOLN
OROMUCOSAL | Status: AC
  Filled 2016-11-28: qty 15

## 2016-11-28 MED ORDER — DICYCLOMINE HCL 10 MG PO CAPS: 10.0000 mg | ORAL_CAPSULE | Freq: Three times a day (TID) | ORAL | 5 refills | Status: DC | PRN

## 2016-11-28 MED ORDER — PROPOFOL 10 MG/ML IV BOLUS
INTRAVENOUS | Status: AC
  Filled 2016-11-28: qty 20

## 2016-11-28 MED ORDER — GLYCOPYRROLATE 0.2 MG/ML IJ SOLN
0.2000 mg | Freq: Once | INTRAMUSCULAR | Status: AC | PRN
  Administered 2016-11-28: 0.2 mg via INTRAVENOUS

## 2016-11-28 MED ORDER — MIDAZOLAM HCL 2 MG/2ML IJ SOLN
INTRAMUSCULAR | Status: AC
  Filled 2016-11-28: qty 2

## 2016-11-28 MED ORDER — SODIUM CHLORIDE 0.9 % IV SOLN
INTRAVENOUS | Status: DC
  Administered 2016-11-28: 09:00:00 via INTRAVENOUS

## 2016-11-28 MED ORDER — INSULIN ASPART 100 UNIT/ML ~~LOC~~ SOLN
5.0000 [IU] | Freq: Once | SUBCUTANEOUS | Status: AC
  Administered 2016-11-28: 5 [IU] via SUBCUTANEOUS
  Filled 2016-11-28: qty 0.05

## 2016-11-28 MED ORDER — PROPOFOL 500 MG/50ML IV EMUL
INTRAVENOUS | Status: DC | PRN
  Administered 2016-11-28: 125 ug/kg/min via INTRAVENOUS

## 2016-11-28 MED ORDER — CHLORHEXIDINE GLUCONATE CLOTH 2 % EX PADS: 6.0000 | MEDICATED_PAD | Freq: Once | CUTANEOUS | Status: DC

## 2016-11-28 MED ORDER — LIDOCAINE VISCOUS 2 % MT SOLN
6.0000 mL | Freq: Once | OROMUCOSAL | Status: AC
  Administered 2016-11-28: 6 mL via OROMUCOSAL

## 2016-11-28 MED ORDER — PROPOFOL 10 MG/ML IV BOLUS
INTRAVENOUS | Status: DC | PRN
  Administered 2016-11-28: 20 mg via INTRAVENOUS

## 2016-11-28 MED ORDER — MIDAZOLAM HCL 2 MG/2ML IJ SOLN
1.0000 mg | INTRAMUSCULAR | Status: AC
  Administered 2016-11-28 (×2): 2 mg via INTRAVENOUS
  Filled 2016-11-28: qty 2

## 2016-11-28 MED ORDER — CHLORHEXIDINE GLUCONATE CLOTH 2 % EX PADS
6.0000 | MEDICATED_PAD | Freq: Once | CUTANEOUS | Status: DC
Start: 2016-11-28 — End: 2016-11-28

## 2016-11-28 NOTE — Discharge Instructions (Signed)
Follow-up with Dr. Dorris Fetch regarding diabetes mellitus and hypertension. Resume usual medications and diet. No driving for 24 hours. Physician will call with biopsy results. Will schedule abdominopelvic CT. Office will call. Gastrointestinal Endoscopy, Care After Refer to this sheet in the next few weeks. These instructions provide you with information about caring for yourself after your procedure. Your health care provider may also give you more specific instructions. Your treatment has been planned according to current medical practices, but problems sometimes occur. Call your health care provider if you have any problems or questions after your procedure. What can I expect after the procedure? After your procedure, it is common to feel:  Bloated.  Soreness in your throat.  Sleepy.  Follow these instructions at home:  Do not drive for 24 hours if you received a if you received a medicine to help you relax (sedative).  Avoid drinking warm beverages and alcohol for the first 24 hours after the procedure.  Take over-the-counter and prescription medicines only as told by your health care provider.  Drink enough fluids to keep your urine clear or pale yellow.  If you feel bloated, try going for a walk. Walking may help the feeling go away.  If your throat is sore, try gargling with salt water. Get help right away if:  You have severe nausea or vomiting.  You have severe abdominal pain, abdominal cramps that last longer than 6 hours, or abdominal swelling.  You have severe shoulder or back pain.  You have trouble swallowing.  You have shortness of breath, your breathing is shallow, or you breathing is faster than normal.  You have a fever.  Your heart is beating very fast.  You vomit blood or material that looks like coffee grounds.  You have bloody, black, or tarry stools. This information is not intended to replace advice given to you by your health care provider. Make  sure you discuss any questions you have with your health care provider. Document Released: 01/16/2004 Document Revised: 04/10/2016 Document Reviewed: 03/26/2015 Elsevier Interactive Patient Education  2018 [employer].

## 2016-11-28 NOTE — H&P (Signed)
Christine Garrett is an 61 y.o. female.   Chief Complaint: Patient is here for EGD. HPI: Patient is 61 year old African-American female with multiple medical problems presents with a history of epigastric pain. She says the pain gets worse with meals. She reports sporadic nausea and vomiting but denies hematemesis or melena. Her daughter states she has lost 60 pounds in the lost 3-4 months. Patient states since she's been having this pain she is quit drinking alcohol. She generally would drink 6 cans of beer every day. She has been on chronic NSAIDs until a few weeks ago. She says she does not feel any better since stopping this medication. No history of pancreatitis. Patient has been under care of  Dr. Dorris Fetch regarding her diabetes. Her primary care physician Dr.Bluth died few weeks ago. She is getting her prescriptions from Dr. Scotty Court until she can find another physician.  Past Medical History:  Diagnosis Date  . Arthritis    knees  . Carpal tunnel syndrome   . CHF (congestive heart failure) ([place])   . Diabetes mellitus    type 2  . GERD (gastroesophageal reflux disease)   . Heart murmur    "Little, No concerns" per Dr  Dannielle Burn pt reported.  . Hypertension    controlled using a guided approch with plasma renin activity  . Neuropathy   . Peripheral vascular disease ([place])   . Sleep apnea    Uses CPAP    Past Surgical History:  Procedure Laterality Date  . [place]  . CERVICAL CONE BIOPSY     cervical lesion  . lipoma removal     right shoulder 2001  . MULTIPLE EXTRACTIONS WITH ALVEOLOPLASTY Bilateral 08/24/2012   Procedure: MULTIPLE EXTRACTION WITH ALVEOLOPLASTY BIOPSY OF RIGHT AND LEFT MANDIBLE ;  Surgeon: Gae Bon, DDS;  Location: [place];  Service: Oral Surgery;  Laterality: Bilateral;  . ROTATOR CUFF REPAIR     right shoulder  . TUBAL LIGATION      Family History  Problem Relation Age of Onset  . Diabetes Mother   . Heart attack Father   . Diabetes Father   . Heart  disease Sister   . Diabetes Sister    Social History:  reports that she quit smoking about 5 years ago. Her smoking use included Cigarettes. She has a 3.00 pack-year smoking history. She has never used smokeless tobacco. She reports that she drinks about 3.6 oz of alcohol per week . She reports that she does not use drugs.  Allergies: No Known Allergies  Medications Prior to Admission  Medication Sig Dispense Refill  . atorvastatin (LIPITOR) 40 MG tablet Take 40 mg by mouth daily.  11  . chlorthalidone (HYGROTON) 25 MG tablet Take 50 mg by mouth daily.  11  . diltiazem (CARDIZEM CD) 300 MG 24 hr capsule Take 300mg s by mouth daily at night  3  . enalapril (VASOTEC) 20 MG tablet Take 20 mg by mouth 2 (two) times daily.     . Insulin Pen Needle (B-D ULTRAFINE III SHORT PEN) 31G X 8 MM MISC 1 each by Does not apply route as directed. 100 each 3  . labetalol (NORMODYNE) 200 MG tablet Take 400 mg by mouth 2 (two) times daily.    . metFORMIN (GLUCOPHAGE) 1000 MG tablet Take 1,000 mg by mouth 2 (two) times daily.     . pantoprazole (PROTONIX) 40 MG tablet Take 40 mg by mouth at bedtime.   5  . insulin  degludec (TRESIBA FLEXTOUCH) 100 UNIT/ML SOPN FlexTouch Pen Inject 0.5 mLs (50 Units total) into the skin daily at 10 pm. 5 pen 2    Results for orders placed or performed during the hospital encounter of 11/28/16 (from the past 48 hour(s))  Glucose, capillary     Status: Abnormal   Collection Time: 11/28/16  8:22 AM  Result Value Ref Range   Glucose-Capillary 336 (H) 65 - 99 mg/dL   No results found.  ROS  Temperature 98.3 F (36.8 C), temperature source Oral. Physical Exam  Constitutional: She appears well-developed and well-nourished.  HENT:  Mouth/Throat: Oropharynx is clear and moist.  Eyes: Conjunctivae are normal. No scleral icterus.  Neck: No thyromegaly present.  Cardiovascular: Normal rate and regular rhythm.   Murmur (faint systolic ejection murmur best heard at left sternal  border.) heard. Respiratory: Effort normal and breath sounds normal.  GI:  Abdomen is full. It is soft with mild to moderate midepigastric tenderness. No organomegaly or masses.  Musculoskeletal: She exhibits no edema.  Lymphadenopathy:    She has no cervical adenopathy.  Neurological: She is alert.  Skin: Skin is warm and dry.     Assessment/Plan Epigastric pain. Sporadic nausea and vomiting. Weight loss. Diagnostic esophagogastroduodenoscopy under monitored anesthesia care.  Hildred Laser, MD 11/28/2016, 8:52 AM

## 2016-11-28 NOTE — Op Note (Signed)
[facility] Patient Name: Christine Garrett Procedure Date: 11/28/2016 9:26 AM MRN: [id number] Date of Birth: 05-12-1956 Attending MD: Hildred Laser , MD CSN: [id number] Age: 61 Admit Type: Outpatient Procedure:                Upper GI endoscopy Indications:              Epigastric abdominal pain, Weight loss Providers:                Hildred Laser, MD, Lurline Del, RN, Rosina Lowenstein, RN Referring MD:             Celedonio Savage, MD Medicines:                Lidocaine spray, Propofol per Anesthesia Complications:            No immediate complications. Estimated Blood Loss:     Estimated blood loss was minimal. Procedure:                Pre-Anesthesia Assessment:                           - Prior to the procedure, a History and Physical                            was performed, and patient medications and                            allergies were reviewed. The patient's tolerance of                            previous anesthesia was also reviewed. The risks                            and benefits of the procedure and the sedation                            options and risks were discussed with the patient.                            All questions were answered, and informed consent                            was obtained. Prior Anticoagulants: The patient has                            taken no previous anticoagulant or antiplatelet                            agents. ASA Grade Assessment: III - A patient with                            severe systemic disease. After reviewing the risks                            and benefits, the patient was deemed in  satisfactory condition to undergo the procedure.                           After obtaining informed consent, the endoscope was                            passed under direct vision. Throughout the                            procedure, the patient's blood pressure, pulse, and                            oxygen  saturations were monitored continuously. The                            [id number] ([id number]) scope was introduced through the                            mouth, and advanced to the second part of duodenum.                            The upper GI endoscopy was accomplished without                            difficulty. The patient tolerated the procedure                            well. Scope In: 9:56:27 AM Scope Out: 10:02:39 AM Total Procedure Duration: 0 hours 6 minutes 12 seconds  Findings:      The examined esophagus was normal.      The Z-line was regular and was found 42 cm from the incisors.      A few dispersed erosions were found in the gastric antrum and in the       prepyloric region of the stomach. Biopsies were taken with a cold       forceps for histology.      The exam of the stomach was otherwise normal.      The duodenal bulb and second portion of the duodenum were normal. Impression:               - Normal esophagus.                           - Z-line regular, 42 cm from the incisors.                           - Erosive gastropathy. Biopsied.                           - Normal duodenal bulb and second portion of the                            duodenum. Moderate Sedation:      Per Anesthesia Care Recommendation:           - Patient has a contact number available for  emergencies. The signs and symptoms of potential                            delayed complications were discussed with the                            patient. Return to normal activities tomorrow.                            Written discharge instructions were provided to the                            patient.                           - Resume previous diet today.                           - Continue present medications.                           - Dicyclomine 10 mg by mouthup to 3 times a day as                            needed.                           - Await pathology  results.                           - Abdominopelvic CT to be scheduled. Procedure Code(s):        --- Professional ---                           [phone], Esophagogastroduodenoscopy, flexible,                            transoral; with biopsy, single or multiple Diagnosis Code(s):        --- Professional ---                           K31.89, Other diseases of stomach and duodenum                           R10.13, Epigastric pain                           R63.4, Abnormal weight loss CPT copyright 2016 American Medical Association. All rights reserved. The codes documented in this report are preliminary and upon coder review may  be revised to meet current compliance requirements. Hildred Laser, MD Hildred Laser, MD 11/28/2016 10:16:07 AM This report has been signed electronically. Number of Addenda: 0

## 2016-11-28 NOTE — Anesthesia Postprocedure Evaluation (Signed)
Anesthesia Post Note  Patient: Christine Garrett  Procedure(s) Performed: Procedure(s) (LRB): ESOPHAGOGASTRODUODENOSCOPY (EGD) WITH PROPOFOL (N/A) BIOPSY  Anesthesia Type: MAC Level of consciousness: awake and alert and oriented Pain management: pain level controlled Respiratory status: spontaneous breathing Cardiovascular status: blood pressure returned to baseline Postop Assessment: adequate PO intake Comments: Late entry     Last Vitals:  Vitals:   11/28/16 1015 11/28/16 1039  BP: 129/86 (!) 152/87  Pulse: 81 81  Resp: (!) 9 16  Temp: 36.9 C 36.8 C    Last Pain:  Vitals:   11/28/16 1039  TempSrc: Oral  PainSc:                  Tressie Stalker

## 2016-11-28 NOTE — Anesthesia Preprocedure Evaluation (Signed)
Anesthesia Evaluation  Patient identified by MRN, date of birth, ID band Patient awake    Reviewed: Allergy & Precautions, H&P , NPO status , Patient's Chart, lab work & pertinent test results  Airway Mallampati: I   Neck ROM: Full    Dental  (+) Teeth Intact, Poor Dentition   Pulmonary shortness of breath, sleep apnea , former smoker,    breath sounds clear to auscultation       Cardiovascular hypertension, Pt. on medications + Peripheral Vascular Disease and +CHF   Rhythm:Regular Rate:Normal  Cath WNL   Neuro/Psych  Neuromuscular disease    GI/Hepatic negative GI ROS, Neg liver ROS, GERD  ,  Endo/Other  diabetes, Poorly Controlled, Type 2Morbid obesity  Renal/GU      Musculoskeletal  (+) Arthritis ,   Abdominal   Peds  Hematology   Anesthesia Other Findings   Reproductive/Obstetrics                             Anesthesia Physical Anesthesia Plan  ASA: III  Anesthesia Plan: MAC   Post-op Pain Management:    Induction: Intravenous  PONV Risk Score and Plan:   Airway Management Planned: Simple Face Mask  Additional Equipment:   Intra-op Plan:   Post-operative Plan:   Informed Consent: I have reviewed the patients History and Physical, chart, labs and discussed the procedure including the risks, benefits and alternatives for the proposed anesthesia with the patient or authorized representative who has indicated his/her understanding and acceptance.     Plan Discussed with:   Anesthesia Plan Comments:         Anesthesia Quick Evaluation

## 2016-11-28 NOTE — Transfer of Care (Signed)
Immediate Anesthesia Transfer of Care Note  Patient: Christine Garrett  Procedure(s) Performed: Procedure(s) with comments: ESOPHAGOGASTRODUODENOSCOPY (EGD) WITH PROPOFOL (N/A) - 9:25 BIOPSY - gastric  Patient Location: PACU  Anesthesia Type:MAC  Level of Consciousness: awake and alert   Airway & Oxygen Therapy: Patient Spontanous Breathing  Post-op Assessment: Report given to RN  Post vital signs: Reviewed and stable  Last Vitals:  Vitals:   11/28/16 0805 11/28/16 0920  BP:  (!) 164/98  Resp:  14  Temp: 36.8 C     Last Pain:  Vitals:   11/28/16 0850  TempSrc:   PainSc: 4       Patients Stated Pain Goal: 9 (34/19/37 9024)  Complications: No apparent anesthesia complications

## 2016-11-29 ENCOUNTER — Other Ambulatory Visit (INDEPENDENT_AMBULATORY_CARE_PROVIDER_SITE_OTHER): Payer: Self-pay | Admitting: Internal Medicine

## 2016-11-29 MED ORDER — POTASSIUM CHLORIDE CRYS ER 20 MEQ PO TBCR: 20.0000 meq | EXTENDED_RELEASE_TABLET | Freq: Every day | ORAL | 2 refills | Status: DC

## 2016-12-01 ENCOUNTER — Other Ambulatory Visit (INDEPENDENT_AMBULATORY_CARE_PROVIDER_SITE_OTHER): Payer: Self-pay | Admitting: Internal Medicine

## 2016-12-01 MED ORDER — BIS SUBCIT-METRONID-TETRACYC 140-125-125 MG PO CAPS: 3.0000 | ORAL_CAPSULE | Freq: Three times a day (TID) | ORAL | 0 refills | Status: DC

## 2016-12-02 ENCOUNTER — Encounter (INDEPENDENT_AMBULATORY_CARE_PROVIDER_SITE_OTHER): Payer: Self-pay | Admitting: *Deleted

## 2016-12-02 ENCOUNTER — Other Ambulatory Visit (INDEPENDENT_AMBULATORY_CARE_PROVIDER_SITE_OTHER): Payer: Self-pay | Admitting: *Deleted

## 2016-12-02 DIAGNOSIS — E876 Hypokalemia: Secondary | ICD-10-CM

## 2016-12-03 ENCOUNTER — Encounter (HOSPITAL_COMMUNITY): Payer: Self-pay | Admitting: Internal Medicine

## 2016-12-05 ENCOUNTER — Ambulatory Visit (HOSPITAL_COMMUNITY): Payer: Medicare Other

## 2016-12-12 ENCOUNTER — Telehealth (INDEPENDENT_AMBULATORY_CARE_PROVIDER_SITE_OTHER): Payer: Self-pay | Admitting: *Deleted

## 2016-12-12 NOTE — Telephone Encounter (Signed)
Patient's insurance requested that a change be made with the [facility]. They would not cover it. Per Dr.Rehman patient may take the following. Prilosec 20 mg BID , Clarithromycin 500 mg BID , Amoxicillin 1 gm BID. All of these are for 14 days. This was called to [facility]. They were going to call the patient.

## 2016-12-16 ENCOUNTER — Telehealth: Payer: Self-pay | Admitting: Internal Medicine

## 2016-12-16 ENCOUNTER — Other Ambulatory Visit (INDEPENDENT_AMBULATORY_CARE_PROVIDER_SITE_OTHER): Payer: Self-pay | Admitting: *Deleted

## 2016-12-16 ENCOUNTER — Telehealth (INDEPENDENT_AMBULATORY_CARE_PROVIDER_SITE_OTHER): Payer: Self-pay | Admitting: *Deleted

## 2016-12-16 ENCOUNTER — Ambulatory Visit (HOSPITAL_COMMUNITY)
Admission: RE | Admit: 2016-12-16 | Discharge: 2016-12-16 | Disposition: A | Discharge status: Home / Self Care | Payer: Medicare Other | Source: Ambulatory Visit | Attending: Internal Medicine | Admitting: Internal Medicine

## 2016-12-16 DIAGNOSIS — R1013 Epigastric pain: Secondary | ICD-10-CM | POA: Diagnosis not present

## 2016-12-16 DIAGNOSIS — I700 Atherosclerosis of aorta: Secondary | ICD-10-CM | POA: Insufficient documentation

## 2016-12-16 DIAGNOSIS — D508 Other iron deficiency anemias: Secondary | ICD-10-CM

## 2016-12-16 DIAGNOSIS — M47895 Other spondylosis, thoracolumbar region: Secondary | ICD-10-CM | POA: Diagnosis not present

## 2016-12-16 DIAGNOSIS — G9520 Unspecified cord compression: Secondary | ICD-10-CM

## 2016-12-16 DIAGNOSIS — R634 Abnormal weight loss: Secondary | ICD-10-CM | POA: Diagnosis not present

## 2016-12-16 DIAGNOSIS — M488X4 Other specified spondylopathies, thoracic region: Secondary | ICD-10-CM | POA: Insufficient documentation

## 2016-12-16 DIAGNOSIS — K760 Fatty (change of) liver, not elsewhere classified: Secondary | ICD-10-CM | POA: Diagnosis not present

## 2016-12-16 DIAGNOSIS — R9389 Abnormal findings on diagnostic imaging of other specified body structures: Secondary | ICD-10-CM

## 2016-12-16 DIAGNOSIS — R1012 Left upper quadrant pain: Secondary | ICD-10-CM | POA: Diagnosis not present

## 2016-12-16 DIAGNOSIS — G8929 Other chronic pain: Secondary | ICD-10-CM | POA: Diagnosis not present

## 2016-12-16 DIAGNOSIS — E876 Hypokalemia: Secondary | ICD-10-CM

## 2016-12-16 LAB — BASIC METABOLIC PANEL
BUN: 10 mg/dL (ref 7–25)
CALCIUM: 10 mg/dL (ref 8.6–10.4)
CO2: 26 mmol/L (ref 20–31)
Chloride: 103 mmol/L (ref 98–110)
Creat: 0.86 mg/dL (ref 0.50–1.05)
Glucose, Bld: 154 mg/dL — ABNORMAL HIGH (ref 65–99)
POTASSIUM: 4 mmol/L (ref 3.5–5.3)
Sodium: 136 mmol/L (ref 135–146)

## 2016-12-16 MED ORDER — IOPAMIDOL (ISOVUE-300) INJECTION 61%
100.0000 mL | Freq: Once | INTRAVENOUS | Status: AC | PRN
  Administered 2016-12-16: 100 mL via INTRAVENOUS

## 2016-12-16 NOTE — Telephone Encounter (Signed)
Patient presented to the office today for a weight check. Her weight was 213.3 lbs. She was going to have her B-Met done and CT this afternoon. Patient was advised that once we get the results she will be called.

## 2016-12-16 NOTE — Telephone Encounter (Signed)
Dr. Richarda Overlie called me about this lady's outpatient CT scan report. 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  Cord significantly compressed to 5 mm.  I called patient and checked on her this evening. She doesn't have any bowel or bladder issues, however, she states she's had numbness, tingling and weakness down her legs. Having some difficulty ambulating, climbing stairs, etc.  I called the neurosurgeon on call Dr. Christella Noa.  I provided him with this information.   He stated that since she is still walking she needed to be placed on Decadron 6 mg every 6 hours (will Rx  12 doses) - . Proceed with a thoracic MRI with and without contrast tomorrow.  She is to call their office tomorrow at [phone], telling them that we have already made contact with Dr. Christella Noa  Urgent appointment as needed.  [facility] [place] I note patient is fairly poorly controlled diabetic. I told patient Decadron would likely exacerbate her diabetes but over the short run, the benefits outweigh the risks. She is to keep close watch on her blood sugars.

## 2016-12-17 ENCOUNTER — Ambulatory Visit (HOSPITAL_COMMUNITY)
Admission: RE | Admit: 2016-12-17 | Discharge: 2016-12-17 | Disposition: A | Discharge status: Home / Self Care | Payer: Medicare Other | Source: Ambulatory Visit | Attending: Internal Medicine | Admitting: Internal Medicine

## 2016-12-17 DIAGNOSIS — R938 Abnormal findings on diagnostic imaging of other specified body structures: Secondary | ICD-10-CM | POA: Diagnosis not present

## 2016-12-17 DIAGNOSIS — M5124 Other intervertebral disc displacement, thoracic region: Secondary | ICD-10-CM | POA: Diagnosis not present

## 2016-12-17 DIAGNOSIS — G9520 Unspecified cord compression: Secondary | ICD-10-CM | POA: Diagnosis not present

## 2016-12-17 DIAGNOSIS — R9389 Abnormal findings on diagnostic imaging of other specified body structures: Secondary | ICD-10-CM

## 2016-12-17 MED ORDER — GADOBENATE DIMEGLUMINE 529 MG/ML IV SOLN
20.0000 mL | Freq: Once | INTRAVENOUS | Status: AC | PRN
  Administered 2016-12-17: 20 mL via INTRAVENOUS

## 2016-12-17 NOTE — Telephone Encounter (Signed)
I spoke with the patient and made her aware she needed to proceed to [facility] for an urgent MRI.  Patient stated she is going to pick up her Rx now, she had to wait until her son arrived to give her money for the copayment.   I also spoke with Rollene Fare at [facility] at [phone].

## 2016-12-17 NOTE — Telephone Encounter (Addendum)
Patient is scheduled to see Dr. Christella Noa on 7/16 and needs to arrive at 1:30 pm the office is located at [place], [place]   Patient is aware of Neuro appt

## 2016-12-17 NOTE — Telephone Encounter (Signed)
Routing to Martina 

## 2016-12-17 NOTE — Addendum Note (Signed)
Addended by: Idamae Schuller on: 12/17/2016 02:30 PM   Modules accepted: Orders

## 2016-12-24 ENCOUNTER — Other Ambulatory Visit: Payer: Self-pay | Admitting: Neurosurgery

## 2016-12-24 DIAGNOSIS — Z6832 Body mass index (BMI) 32.0-32.9, adult: Secondary | ICD-10-CM | POA: Diagnosis not present

## 2016-12-24 DIAGNOSIS — I10 Essential (primary) hypertension: Secondary | ICD-10-CM | POA: Diagnosis not present

## 2016-12-24 DIAGNOSIS — D492 Neoplasm of unspecified behavior of bone, soft tissue, and skin: Secondary | ICD-10-CM | POA: Diagnosis not present

## 2016-12-25 ENCOUNTER — Encounter (HOSPITAL_COMMUNITY): Payer: Self-pay | Admitting: *Deleted

## 2016-12-25 NOTE — Progress Notes (Signed)
Pt denies SOB, chest pain, and being under the care of a cardiologist. Pt denies having an echo and cardiac cath but stated that a stress test was done by Dr. Wenda Overland; records requested. Pt denies allergy to Hydrocodone (previously noted in Dr. Irene Shipper correspondence scanned in [facility] 10/08/16 ). Pt made aware to stop taking Aspirin, vitamins, fish oil and herbal medications. Do not take any NSAIDs ie: Ibuprofen, Advil, Naproxen, Motrin, Aleve, BC and Goody Powder or any medication containing Aspirin. Dr. Kalman Shan, Anesthesia, reviewed pt EKG; MD stated that EKG was "okay." Pt denies having a chest x ray within the last year. Pt made aware to take half dose of Tresiba insulin tonight ( 25 units only) and no Metformin on morning of procedure. Pt made aware to check BG every 2 hours prior to arrival to hospital on DOS. Pt made aware to treat a BG < 70 with  4 ounces of apple  juice, wait 15 minutes after intervention to recheck BG, if BG remains < 70, call Short Stay unit to speak with a nurse. Pt verbalized understanding of all pre-op instructions.

## 2016-12-26 ENCOUNTER — Inpatient Hospital Stay (HOSPITAL_COMMUNITY): Payer: Medicare Other

## 2016-12-26 ENCOUNTER — Inpatient Hospital Stay (HOSPITAL_COMMUNITY): Payer: Medicare Other | Admitting: Certified Registered Nurse Anesthetist

## 2016-12-26 ENCOUNTER — Encounter (HOSPITAL_COMMUNITY)
Admission: RE | Disposition: A | Discharge status: Home / Self Care | Payer: Self-pay | Source: Ambulatory Visit | Attending: Neurosurgery

## 2016-12-26 ENCOUNTER — Encounter (HOSPITAL_COMMUNITY): Payer: Self-pay | Admitting: Certified Registered Nurse Anesthetist

## 2016-12-26 ENCOUNTER — Inpatient Hospital Stay (HOSPITAL_COMMUNITY)
Admission: RE | Admit: 2016-12-26 | Discharge: 2016-12-30 | DRG: 822 | Disposition: A | Discharge status: Home / Self Care | Payer: Medicare Other | Source: Ambulatory Visit | Attending: Neurosurgery | Admitting: Neurosurgery

## 2016-12-26 DIAGNOSIS — Z8249 Family history of ischemic heart disease and other diseases of the circulatory system: Secondary | ICD-10-CM

## 2016-12-26 DIAGNOSIS — I509 Heart failure, unspecified: Secondary | ICD-10-CM | POA: Diagnosis present

## 2016-12-26 DIAGNOSIS — Z419 Encounter for procedure for purposes other than remedying health state, unspecified: Secondary | ICD-10-CM

## 2016-12-26 DIAGNOSIS — E1165 Type 2 diabetes mellitus with hyperglycemia: Secondary | ICD-10-CM | POA: Diagnosis present

## 2016-12-26 DIAGNOSIS — R011 Cardiac murmur, unspecified: Secondary | ICD-10-CM | POA: Diagnosis not present

## 2016-12-26 DIAGNOSIS — K219 Gastro-esophageal reflux disease without esophagitis: Secondary | ICD-10-CM | POA: Diagnosis present

## 2016-12-26 DIAGNOSIS — D497 Neoplasm of unspecified behavior of endocrine glands and other parts of nervous system: Secondary | ICD-10-CM | POA: Diagnosis not present

## 2016-12-26 DIAGNOSIS — I10 Essential (primary) hypertension: Secondary | ICD-10-CM | POA: Diagnosis not present

## 2016-12-26 DIAGNOSIS — E1151 Type 2 diabetes mellitus with diabetic peripheral angiopathy without gangrene: Secondary | ICD-10-CM | POA: Diagnosis present

## 2016-12-26 DIAGNOSIS — Z87891 Personal history of nicotine dependence: Secondary | ICD-10-CM

## 2016-12-26 DIAGNOSIS — E1051 Type 1 diabetes mellitus with diabetic peripheral angiopathy without gangrene: Secondary | ICD-10-CM | POA: Diagnosis not present

## 2016-12-26 DIAGNOSIS — Z7984 Long term (current) use of oral hypoglycemic drugs: Secondary | ICD-10-CM | POA: Diagnosis not present

## 2016-12-26 DIAGNOSIS — R531 Weakness: Secondary | ICD-10-CM | POA: Diagnosis not present

## 2016-12-26 DIAGNOSIS — G4730 Sleep apnea, unspecified: Secondary | ICD-10-CM | POA: Diagnosis present

## 2016-12-26 DIAGNOSIS — Z79899 Other long term (current) drug therapy: Secondary | ICD-10-CM

## 2016-12-26 DIAGNOSIS — Z9851 Tubal ligation status: Secondary | ICD-10-CM

## 2016-12-26 DIAGNOSIS — E1142 Type 2 diabetes mellitus with diabetic polyneuropathy: Secondary | ICD-10-CM | POA: Diagnosis present

## 2016-12-26 DIAGNOSIS — G959 Disease of spinal cord, unspecified: Secondary | ICD-10-CM | POA: Diagnosis not present

## 2016-12-26 DIAGNOSIS — Z833 Family history of diabetes mellitus: Secondary | ICD-10-CM | POA: Diagnosis not present

## 2016-12-26 DIAGNOSIS — Z9889 Other specified postprocedural states: Secondary | ICD-10-CM | POA: Diagnosis not present

## 2016-12-26 DIAGNOSIS — E1140 Type 2 diabetes mellitus with diabetic neuropathy, unspecified: Secondary | ICD-10-CM | POA: Diagnosis present

## 2016-12-26 DIAGNOSIS — R200 Anesthesia of skin: Secondary | ICD-10-CM | POA: Diagnosis not present

## 2016-12-26 DIAGNOSIS — D492 Neoplasm of unspecified behavior of bone, soft tissue, and skin: Secondary | ICD-10-CM | POA: Diagnosis not present

## 2016-12-26 DIAGNOSIS — C9030 Solitary plasmacytoma not having achieved remission: Secondary | ICD-10-CM | POA: Diagnosis not present

## 2016-12-26 DIAGNOSIS — K5900 Constipation, unspecified: Secondary | ICD-10-CM | POA: Diagnosis present

## 2016-12-26 DIAGNOSIS — I110 Hypertensive heart disease with heart failure: Secondary | ICD-10-CM | POA: Diagnosis not present

## 2016-12-26 DIAGNOSIS — Z794 Long term (current) use of insulin: Secondary | ICD-10-CM

## 2016-12-26 DIAGNOSIS — E119 Type 2 diabetes mellitus without complications: Secondary | ICD-10-CM | POA: Diagnosis not present

## 2016-12-26 DIAGNOSIS — M4324 Fusion of spine, thoracic region: Secondary | ICD-10-CM | POA: Diagnosis not present

## 2016-12-26 HISTORY — PX: POSTERIOR LUMBAR FUSION 4 LEVEL: SHX6037

## 2016-12-26 LAB — GLUCOSE, CAPILLARY
GLUCOSE-CAPILLARY: 170 mg/dL — AB (ref 65–99)
GLUCOSE-CAPILLARY: 179 mg/dL — AB (ref 65–99)
GLUCOSE-CAPILLARY: 184 mg/dL — AB (ref 65–99)
GLUCOSE-CAPILLARY: 298 mg/dL — AB (ref 65–99)
Glucose-Capillary: 460 mg/dL — ABNORMAL HIGH (ref 65–99)
Glucose-Capillary: 332 mg/dL — ABNORMAL HIGH (ref 65–99)
Glucose-Capillary: 396 mg/dL — ABNORMAL HIGH (ref 65–99)
Glucose-Capillary: 135 mg/dL — ABNORMAL HIGH (ref 65–99)
Glucose-Capillary: 173 mg/dL — ABNORMAL HIGH (ref 65–99)
Glucose-Capillary: 165 mg/dL — ABNORMAL HIGH (ref 65–99)

## 2016-12-26 LAB — CBC
HEMATOCRIT: 36 % (ref 36.0–46.0)
HEMOGLOBIN: 12.7 g/dL (ref 12.0–15.0)
MCH: 31.6 pg (ref 26.0–34.0)
MCHC: 35.3 g/dL (ref 30.0–36.0)
MCV: 89.6 fL (ref 78.0–100.0)
Platelets: 313 10*3/uL (ref 150–400)
RBC: 4.02 MIL/uL (ref 3.87–5.11)
RDW: 12.4 % (ref 11.5–15.5)
WBC: 12.5 10*3/uL — ABNORMAL HIGH (ref 4.0–10.5)

## 2016-12-26 LAB — ABO/RH: ABO/RH(D): B POS

## 2016-12-26 LAB — TYPE AND SCREEN
ABO/RH(D): B POS
Antibody Screen: NEGATIVE

## 2016-12-26 LAB — SURGICAL PCR SCREEN
MRSA, PCR: NEGATIVE
STAPHYLOCOCCUS AUREUS: NEGATIVE

## 2016-12-26 SURGERY — POSTERIOR LUMBAR FUSION 4 LEVEL
Anesthesia: General

## 2016-12-26 MED ORDER — OXYCODONE HCL 5 MG PO TABS
5.0000 mg | ORAL_TABLET | ORAL | Status: DC | PRN
  Administered 2016-12-27 – 2016-12-28 (×2): 10 mg via ORAL
  Administered 2016-12-29: 5 mg via ORAL
  Administered 2016-12-30: 10 mg via ORAL
  Filled 2016-12-26: qty 2
  Filled 2016-12-26: qty 1
  Filled 2016-12-26 (×2): qty 2

## 2016-12-26 MED ORDER — BISACODYL 5 MG PO TBEC
5.0000 mg | DELAYED_RELEASE_TABLET | Freq: Every day | ORAL | Status: DC | PRN
  Administered 2016-12-28 – 2016-12-29 (×2): 5 mg via ORAL
  Filled 2016-12-26 (×2): qty 1

## 2016-12-26 MED ORDER — AMOXICILLIN 500 MG PO CAPS
1000.0000 mg | ORAL_CAPSULE | Freq: Two times a day (BID) | ORAL | Status: AC
  Administered 2016-12-26: 1000 mg via ORAL
  Filled 2016-12-26: qty 2

## 2016-12-26 MED ORDER — FENTANYL CITRATE (PF) 250 MCG/5ML IJ SOLN
INTRAMUSCULAR | Status: AC
  Filled 2016-12-26: qty 5

## 2016-12-26 MED ORDER — SODIUM CHLORIDE 0.9% FLUSH: 3.0000 mL | INTRAVENOUS | Status: DC | PRN

## 2016-12-26 MED ORDER — KETOROLAC TROMETHAMINE 30 MG/ML IJ SOLN
INTRAMUSCULAR | Status: AC
  Filled 2016-12-26: qty 1

## 2016-12-26 MED ORDER — DEXTROSE 50 % IV SOLN: 25.0000 mL | INTRAVENOUS | Status: DC | PRN

## 2016-12-26 MED ORDER — DILTIAZEM HCL ER COATED BEADS 120 MG PO CP24
300.0000 mg | ORAL_CAPSULE | Freq: Every day | ORAL | Status: DC
  Administered 2016-12-27 – 2016-12-30 (×4): 300 mg via ORAL
  Filled 2016-12-26 (×4): qty 1

## 2016-12-26 MED ORDER — LABETALOL HCL 5 MG/ML IV SOLN
5.0000 mg | Freq: Once | INTRAVENOUS | Status: AC
  Administered 2016-12-26: 5 mg via INTRAVENOUS

## 2016-12-26 MED ORDER — ONDANSETRON HCL 4 MG/2ML IJ SOLN
4.0000 mg | Freq: Four times a day (QID) | INTRAMUSCULAR | Status: DC | PRN
  Administered 2016-12-28: 4 mg via INTRAVENOUS
  Filled 2016-12-26: qty 2

## 2016-12-26 MED ORDER — PANTOPRAZOLE SODIUM 40 MG PO TBEC
80.0000 mg | DELAYED_RELEASE_TABLET | Freq: Every day | ORAL | Status: DC
  Administered 2016-12-27 – 2016-12-30 (×4): 80 mg via ORAL
  Filled 2016-12-26 (×4): qty 2

## 2016-12-26 MED ORDER — PROPOFOL 10 MG/ML IV BOLUS
INTRAVENOUS | Status: AC
  Filled 2016-12-26: qty 20

## 2016-12-26 MED ORDER — SODIUM CHLORIDE 0.9 % IV SOLN
INTRAVENOUS | Status: DC
  Administered 2016-12-26: 7.1 [IU]/h via INTRAVENOUS
  Filled 2016-12-26: qty 1

## 2016-12-26 MED ORDER — LIDOCAINE-EPINEPHRINE 0.5 %-1:200000 IJ SOLN
INTRAMUSCULAR | Status: AC
  Filled 2016-12-26: qty 1

## 2016-12-26 MED ORDER — THROMBIN 5000 UNITS EX SOLR
CUTANEOUS | Status: AC
  Filled 2016-12-26: qty 5000

## 2016-12-26 MED ORDER — SODIUM CHLORIDE 0.9 % IV SOLN: INTRAVENOUS | Status: DC

## 2016-12-26 MED ORDER — CLARITHROMYCIN 500 MG PO TABS
500.0000 mg | ORAL_TABLET | Freq: Two times a day (BID) | ORAL | Status: AC
  Administered 2016-12-26: 500 mg via ORAL
  Filled 2016-12-26: qty 1

## 2016-12-26 MED ORDER — ZOLPIDEM TARTRATE 5 MG PO TABS: 5.0000 mg | ORAL_TABLET | Freq: Every evening | ORAL | Status: DC | PRN

## 2016-12-26 MED ORDER — SENNOSIDES-DOCUSATE SODIUM 8.6-50 MG PO TABS
1.0000 | ORAL_TABLET | Freq: Every evening | ORAL | Status: DC | PRN
  Administered 2016-12-27: 1 via ORAL
  Filled 2016-12-26: qty 1

## 2016-12-26 MED ORDER — ACETAMINOPHEN 325 MG PO TABS: 650.0000 mg | ORAL_TABLET | ORAL | Status: DC | PRN

## 2016-12-26 MED ORDER — CHLORTHALIDONE 25 MG PO TABS
25.0000 mg | ORAL_TABLET | Freq: Two times a day (BID) | ORAL | Status: DC
  Administered 2016-12-27 – 2016-12-30 (×6): 25 mg via ORAL
  Filled 2016-12-26 (×8): qty 1

## 2016-12-26 MED ORDER — INSULIN REGULAR BOLUS VIA INFUSION
0.0000 [IU] | Freq: Three times a day (TID) | INTRAVENOUS | Status: DC
  Filled 2016-12-26: qty 10

## 2016-12-26 MED ORDER — ONDANSETRON HCL 4 MG PO TABS: 4.0000 mg | ORAL_TABLET | Freq: Four times a day (QID) | ORAL | Status: DC | PRN

## 2016-12-26 MED ORDER — ACETAMINOPHEN 650 MG RE SUPP: 650.0000 mg | RECTAL | Status: DC | PRN

## 2016-12-26 MED ORDER — THROMBIN 5000 UNITS EX SOLR
CUTANEOUS | Status: DC | PRN
  Administered 2016-12-26: 17:00:00 via TOPICAL

## 2016-12-26 MED ORDER — MUPIROCIN 2 % EX OINT
1.0000 "application " | TOPICAL_OINTMENT | Freq: Once | CUTANEOUS | Status: AC
  Administered 2016-12-26: 1 via TOPICAL

## 2016-12-26 MED ORDER — SODIUM CHLORIDE 0.9 % IV SOLN: 250.0000 mL | INTRAVENOUS | Status: DC

## 2016-12-26 MED ORDER — VECURONIUM BROMIDE 10 MG IV SOLR
INTRAVENOUS | Status: AC
  Filled 2016-12-26: qty 10

## 2016-12-26 MED ORDER — POTASSIUM CHLORIDE IN NACL 20-0.9 MEQ/L-% IV SOLN
INTRAVENOUS | Status: DC
  Administered 2016-12-26: 21:00:00 via INTRAVENOUS
  Filled 2016-12-26 (×2): qty 1000

## 2016-12-26 MED ORDER — KETOROLAC TROMETHAMINE 30 MG/ML IJ SOLN
30.0000 mg | Freq: Once | INTRAMUSCULAR | Status: DC | PRN
  Administered 2016-12-26: 30 mg via INTRAVENOUS

## 2016-12-26 MED ORDER — OXYCODONE HCL ER 15 MG PO T12A
15.0000 mg | EXTENDED_RELEASE_TABLET | Freq: Two times a day (BID) | ORAL | Status: DC
  Administered 2016-12-26 – 2016-12-30 (×8): 15 mg via ORAL
  Filled 2016-12-26 (×8): qty 1

## 2016-12-26 MED ORDER — DIAZEPAM 5 MG PO TABS: 5.0000 mg | ORAL_TABLET | Freq: Four times a day (QID) | ORAL | Status: DC | PRN

## 2016-12-26 MED ORDER — HYDROMORPHONE HCL 1 MG/ML IJ SOLN
INTRAMUSCULAR | Status: AC
  Filled 2016-12-26: qty 0.5

## 2016-12-26 MED ORDER — MUPIROCIN 2 % EX OINT
TOPICAL_OINTMENT | CUTANEOUS | Status: AC
  Filled 2016-12-26: qty 22

## 2016-12-26 MED ORDER — ONDANSETRON HCL 4 MG/2ML IJ SOLN
INTRAMUSCULAR | Status: DC | PRN
  Administered 2016-12-26: 4 mg via INTRAVENOUS

## 2016-12-26 MED ORDER — 0.9 % SODIUM CHLORIDE (POUR BTL) OPTIME
TOPICAL | Status: DC | PRN
  Administered 2016-12-26: 1000 mL

## 2016-12-26 MED ORDER — MIDAZOLAM HCL 5 MG/5ML IJ SOLN
INTRAMUSCULAR | Status: DC | PRN
  Administered 2016-12-26: 2 mg via INTRAVENOUS

## 2016-12-26 MED ORDER — MAGNESIUM CITRATE PO SOLN
1.0000 | Freq: Once | ORAL | Status: AC | PRN
  Administered 2016-12-28: 1 via ORAL
  Filled 2016-12-26: qty 296

## 2016-12-26 MED ORDER — PROPOFOL 10 MG/ML IV BOLUS
INTRAVENOUS | Status: DC | PRN
  Administered 2016-12-26: 150 mg via INTRAVENOUS

## 2016-12-26 MED ORDER — MIDAZOLAM HCL 2 MG/2ML IJ SOLN
INTRAMUSCULAR | Status: AC
  Filled 2016-12-26: qty 2

## 2016-12-26 MED ORDER — CLONIDINE HCL 0.1 MG PO TABS
0.1000 mg | ORAL_TABLET | Freq: Three times a day (TID) | ORAL | Status: DC | PRN
  Administered 2016-12-26: 0.1 mg via ORAL
  Filled 2016-12-26: qty 1

## 2016-12-26 MED ORDER — FENTANYL CITRATE (PF) 100 MCG/2ML IJ SOLN
INTRAMUSCULAR | Status: DC | PRN
  Administered 2016-12-26 (×4): 50 ug via INTRAVENOUS
  Administered 2016-12-26: 100 ug via INTRAVENOUS
  Administered 2016-12-26 (×3): 50 ug via INTRAVENOUS

## 2016-12-26 MED ORDER — LIDOCAINE HCL (CARDIAC) 20 MG/ML IV SOLN
INTRAVENOUS | Status: AC
  Filled 2016-12-26: qty 5

## 2016-12-26 MED ORDER — METFORMIN HCL 500 MG PO TABS
1000.0000 mg | ORAL_TABLET | Freq: Two times a day (BID) | ORAL | Status: DC
  Administered 2016-12-27 (×2): 1000 mg via ORAL
  Filled 2016-12-26 (×2): qty 2

## 2016-12-26 MED ORDER — ACETAMINOPHEN 500 MG PO TABS
1000.0000 mg | ORAL_TABLET | Freq: Four times a day (QID) | ORAL | Status: AC
  Administered 2016-12-26 – 2016-12-27 (×4): 1000 mg via ORAL
  Filled 2016-12-26 (×4): qty 2

## 2016-12-26 MED ORDER — DEXTROSE-NACL 5-0.45 % IV SOLN
INTRAVENOUS | Status: DC
  Administered 2016-12-26: 19:00:00 via INTRAVENOUS

## 2016-12-26 MED ORDER — SUGAMMADEX SODIUM 200 MG/2ML IV SOLN
INTRAVENOUS | Status: AC
  Filled 2016-12-26: qty 2

## 2016-12-26 MED ORDER — THROMBIN 20000 UNITS EX SOLR
CUTANEOUS | Status: AC
  Filled 2016-12-26: qty 20000

## 2016-12-26 MED ORDER — DOCUSATE SODIUM 100 MG PO CAPS
100.0000 mg | ORAL_CAPSULE | Freq: Two times a day (BID) | ORAL | Status: DC
  Administered 2016-12-26 – 2016-12-30 (×8): 100 mg via ORAL
  Filled 2016-12-26 (×8): qty 1

## 2016-12-26 MED ORDER — HYDROMORPHONE HCL 1 MG/ML IJ SOLN
0.2500 mg | INTRAMUSCULAR | Status: DC | PRN
  Administered 2016-12-26 (×4): 0.5 mg via INTRAVENOUS

## 2016-12-26 MED ORDER — CEFAZOLIN SODIUM 1 G IJ SOLR
INTRAMUSCULAR | Status: DC | PRN
  Administered 2016-12-26: 2 g via INTRAMUSCULAR

## 2016-12-26 MED ORDER — SUGAMMADEX SODIUM 200 MG/2ML IV SOLN
INTRAVENOUS | Status: DC | PRN
  Administered 2016-12-26: 200 mg via INTRAVENOUS

## 2016-12-26 MED ORDER — LACTATED RINGERS IV SOLN
INTRAVENOUS | Status: DC | PRN
Start: 2016-12-26 — End: 2016-12-26
  Administered 2016-12-26: 12:00:00 via INTRAVENOUS

## 2016-12-26 MED ORDER — MENTHOL 3 MG MT LOZG: 1.0000 | LOZENGE | OROMUCOSAL | Status: DC | PRN

## 2016-12-26 MED ORDER — BUPIVACAINE HCL (PF) 0.5 % IJ SOLN
INTRAMUSCULAR | Status: DC | PRN
  Administered 2016-12-26: 30 mL

## 2016-12-26 MED ORDER — ONDANSETRON HCL 4 MG/2ML IJ SOLN
INTRAMUSCULAR | Status: AC
  Filled 2016-12-26: qty 2

## 2016-12-26 MED ORDER — DICYCLOMINE HCL 10 MG PO CAPS
10.0000 mg | ORAL_CAPSULE | Freq: Three times a day (TID) | ORAL | Status: DC | PRN
  Filled 2016-12-26: qty 1

## 2016-12-26 MED ORDER — MORPHINE SULFATE (PF) 4 MG/ML IV SOLN
2.0000 mg | INTRAVENOUS | Status: DC | PRN
  Administered 2016-12-27 (×2): 2 mg via INTRAVENOUS
  Filled 2016-12-26 (×2): qty 1

## 2016-12-26 MED ORDER — CEFAZOLIN SODIUM 1 G IJ SOLR
INTRAMUSCULAR | Status: AC
  Filled 2016-12-26: qty 40

## 2016-12-26 MED ORDER — ROCURONIUM BROMIDE 100 MG/10ML IV SOLN
INTRAVENOUS | Status: DC | PRN
  Administered 2016-12-26: 50 mg via INTRAVENOUS

## 2016-12-26 MED ORDER — POTASSIUM CHLORIDE CRYS ER 20 MEQ PO TBCR
20.0000 meq | EXTENDED_RELEASE_TABLET | Freq: Every day | ORAL | Status: DC
  Administered 2016-12-27 – 2016-12-30 (×4): 20 meq via ORAL
  Filled 2016-12-26 (×4): qty 1

## 2016-12-26 MED ORDER — PHENOL 1.4 % MT LIQD: 1.0000 | OROMUCOSAL | Status: DC | PRN

## 2016-12-26 MED ORDER — PROMETHAZINE HCL 25 MG/ML IJ SOLN: 6.2500 mg | INTRAMUSCULAR | Status: DC | PRN

## 2016-12-26 MED ORDER — SODIUM CHLORIDE 0.9 % IV SOLN
INTRAVENOUS | Status: DC
  Filled 2016-12-26: qty 1

## 2016-12-26 MED ORDER — VECURONIUM BROMIDE 10 MG IV SOLR
INTRAVENOUS | Status: DC | PRN
  Administered 2016-12-26: 4 mg via INTRAVENOUS
  Administered 2016-12-26: 2 mg via INTRAVENOUS

## 2016-12-26 MED ORDER — ARTIFICIAL TEARS OPHTHALMIC OINT
TOPICAL_OINTMENT | OPHTHALMIC | Status: DC | PRN
  Administered 2016-12-26: 1 via OPHTHALMIC

## 2016-12-26 MED ORDER — BUPIVACAINE HCL (PF) 0.5 % IJ SOLN
INTRAMUSCULAR | Status: AC
  Filled 2016-12-26: qty 30

## 2016-12-26 MED ORDER — LIDOCAINE-EPINEPHRINE 0.5 %-1:200000 IJ SOLN
INTRAMUSCULAR | Status: DC | PRN
  Administered 2016-12-26: 10 mL

## 2016-12-26 MED ORDER — LABETALOL HCL 5 MG/ML IV SOLN
INTRAVENOUS | Status: AC
  Filled 2016-12-26: qty 4

## 2016-12-26 MED ORDER — LIDOCAINE HCL (CARDIAC) 20 MG/ML IV SOLN
INTRAVENOUS | Status: DC | PRN
  Administered 2016-12-26: 100 mg via INTRAVENOUS

## 2016-12-26 MED ORDER — SODIUM CHLORIDE 0.9 % IV SOLN
INTRAVENOUS | Status: DC | PRN
  Administered 2016-12-26 (×2): via INTRAVENOUS

## 2016-12-26 MED ORDER — THROMBIN 20000 UNITS EX SOLR
CUTANEOUS | Status: DC | PRN
  Administered 2016-12-26: 15:00:00 via TOPICAL

## 2016-12-26 MED ORDER — ALBUTEROL SULFATE HFA 108 (90 BASE) MCG/ACT IN AERS
INHALATION_SPRAY | RESPIRATORY_TRACT | Status: AC
  Filled 2016-12-26: qty 6.7

## 2016-12-26 MED ORDER — ROCURONIUM BROMIDE 50 MG/5ML IV SOLN
INTRAVENOUS | Status: AC
  Filled 2016-12-26: qty 1

## 2016-12-26 MED ORDER — SODIUM CHLORIDE 0.9% FLUSH: 3.0000 mL | Freq: Two times a day (BID) | INTRAVENOUS | Status: DC

## 2016-12-26 MED ORDER — ENALAPRIL MALEATE 5 MG PO TABS
20.0000 mg | ORAL_TABLET | Freq: Two times a day (BID) | ORAL | Status: DC
  Administered 2016-12-26 – 2016-12-30 (×7): 20 mg via ORAL
  Filled 2016-12-26 (×2): qty 1
  Filled 2016-12-26: qty 4
  Filled 2016-12-26: qty 1
  Filled 2016-12-26: qty 4
  Filled 2016-12-26: qty 1
  Filled 2016-12-26: qty 4
  Filled 2016-12-26: qty 1
  Filled 2016-12-26: qty 4

## 2016-12-26 SURGICAL SUPPLY — 65 items
BAG DECANTER FOR FLEXI CONT (MISCELLANEOUS) ×3 IMPLANT
BENZOIN TINCTURE PRP APPL 2/3 (GAUZE/BANDAGES/DRESSINGS) IMPLANT
BLADE CLIPPER SURG (BLADE) IMPLANT
BUR MATCHSTICK NEURO 3.0 LAGG (BURR) ×3 IMPLANT
BUR PRECISION FLUTE 5.0 (BURR) ×3 IMPLANT
CANISTER SUCT 3000ML PPV (MISCELLANEOUS) ×3 IMPLANT
CARTRIDGE OIL MAESTRO DRILL (MISCELLANEOUS) ×1 IMPLANT
CLOSURE WOUND 1/2 X4 (GAUZE/BANDAGES/DRESSINGS)
CONT SPEC 4OZ CLIKSEAL STRL BL (MISCELLANEOUS) ×3 IMPLANT
COVER BACK TABLE 60X90IN (DRAPES) IMPLANT
DECANTER SPIKE VIAL GLASS SM (MISCELLANEOUS) ×3 IMPLANT
DERMABOND ADVANCED (GAUZE/BANDAGES/DRESSINGS) ×4
DERMABOND ADVANCED .7 DNX12 (GAUZE/BANDAGES/DRESSINGS) ×2 IMPLANT
DIFFUSER DRILL AIR PNEUMATIC (MISCELLANEOUS) ×3 IMPLANT
DRAPE C-ARM 42X72 X-RAY (DRAPES) ×6 IMPLANT
DRAPE C-ARMOR (DRAPES) ×6 IMPLANT
DRAPE LAPAROTOMY 100X72X124 (DRAPES) ×3 IMPLANT
DRAPE POUCH INSTRU U-SHP 10X18 (DRAPES) ×3 IMPLANT
DRAPE SURG 17X23 STRL (DRAPES) ×3 IMPLANT
DURAPREP 26ML APPLICATOR (WOUND CARE) ×3 IMPLANT
ELECT REM PT RETURN 9FT ADLT (ELECTROSURGICAL) ×3
ELECTRODE REM PT RTRN 9FT ADLT (ELECTROSURGICAL) ×1 IMPLANT
GAUZE SPONGE 4X4 12PLY STRL (GAUZE/BANDAGES/DRESSINGS) IMPLANT
GAUZE SPONGE 4X4 16PLY XRAY LF (GAUZE/BANDAGES/DRESSINGS) IMPLANT
GLOVE BIO SURGEON STRL SZ8 (GLOVE) ×3 IMPLANT
GLOVE ECLIPSE 6.5 STRL STRAW (GLOVE) ×6 IMPLANT
GLOVE EXAM NITRILE LRG STRL (GLOVE) IMPLANT
GLOVE EXAM NITRILE XL STR (GLOVE) IMPLANT
GLOVE EXAM NITRILE XS STR PU (GLOVE) IMPLANT
GOWN STRL REUS W/ TWL LRG LVL3 (GOWN DISPOSABLE) ×2 IMPLANT
GOWN STRL REUS W/ TWL XL LVL3 (GOWN DISPOSABLE) IMPLANT
GOWN STRL REUS W/TWL 2XL LVL3 (GOWN DISPOSABLE) IMPLANT
GOWN STRL REUS W/TWL LRG LVL3 (GOWN DISPOSABLE) ×4
GOWN STRL REUS W/TWL XL LVL3 (GOWN DISPOSABLE)
GRAFT BN 10X1XDBM MAGNIFUSE (Bone Implant) ×2 IMPLANT
GRAFT BONE MAGNIFUSE 1X10CM (Bone Implant) ×4 IMPLANT
HEMOSTAT POWDER KIT SURGIFOAM (HEMOSTASIS) ×3 IMPLANT
KIT BASIN OR (CUSTOM PROCEDURE TRAY) ×3 IMPLANT
KIT POSITION SURG JACKSON T1 (MISCELLANEOUS) ×3 IMPLANT
KIT ROOM TURNOVER OR (KITS) ×3 IMPLANT
NEEDLE HYPO 25X1 1.5 SAFETY (NEEDLE) ×3 IMPLANT
NEEDLE SPNL 18GX3.5 QUINCKE PK (NEEDLE) IMPLANT
NS IRRIG 1000ML POUR BTL (IV SOLUTION) ×3 IMPLANT
OIL CARTRIDGE MAESTRO DRILL (MISCELLANEOUS) ×3
PACK LAMINECTOMY NEURO (CUSTOM PROCEDURE TRAY) ×3 IMPLANT
PAD ARMBOARD 7.5X6 YLW CONV (MISCELLANEOUS) ×9 IMPLANT
ROD RELINE-O 5.5X300 STRT NS (Rod) ×1 IMPLANT
ROD RELINE-O 5.5X300MM STRT (Rod) ×2 IMPLANT
ROD RELINE-O LORD 5.5X110MM (Rod) ×3 IMPLANT
SCREW LOCK RELINE 5.5 TULIP (Screw) ×24 IMPLANT
SCREW RELINE 5.0X35MM POLY 2S (Screw) ×12 IMPLANT
SCREW RELINE-O POLY 5.5X40 (Screw) ×3 IMPLANT
SCREW RELINE-O POLY 5.5X45MM (Screw) ×3 IMPLANT
SCREW RELINE-O POLY 6.5X40 (Screw) ×6 IMPLANT
SPONGE LAP 4X18 X RAY DECT (DISPOSABLE) IMPLANT
SPONGE SURGIFOAM ABS GEL 100 (HEMOSTASIS) ×3 IMPLANT
STRIP CLOSURE SKIN 1/2X4 (GAUZE/BANDAGES/DRESSINGS) IMPLANT
SUT PROLENE 6 0 BV (SUTURE) IMPLANT
SUT VIC AB 0 CT1 18XCR BRD8 (SUTURE) ×3 IMPLANT
SUT VIC AB 0 CT1 8-18 (SUTURE) ×6
SUT VIC AB 2-0 CT1 18 (SUTURE) ×6 IMPLANT
SUT VIC AB 3-0 SH 8-18 (SUTURE) ×12 IMPLANT
TOWEL GREEN STERILE (TOWEL DISPOSABLE) ×3 IMPLANT
TOWEL GREEN STERILE FF (TOWEL DISPOSABLE) ×3 IMPLANT
WATER STERILE IRR 1000ML POUR (IV SOLUTION) ×3 IMPLANT

## 2016-12-26 NOTE — Anesthesia Preprocedure Evaluation (Addendum)
Anesthesia Evaluation  Patient identified by MRN, date of birth, ID band Patient awake    Reviewed: Allergy & Precautions, NPO status , Patient's Chart, lab work & pertinent test results  Airway Mallampati: II   Neck ROM: Full    Dental  (+) Edentulous Upper, Edentulous Lower   Pulmonary shortness of breath, sleep apnea , former smoker,    breath sounds clear to auscultation       Cardiovascular hypertension, + Peripheral Vascular Disease   Rhythm:Regular Rate:Normal  No apparent CHF sx   Neuro/Psych Spinal tumor  Neuromuscular disease    GI/Hepatic GERD  ,  Endo/Other  diabetes, Poorly Controlled, Type 1, Insulin DependentMorbid obesity  Renal/GU      Musculoskeletal  (+) Arthritis ,   Abdominal (+) + obese,   Peds  Hematology negative hematology ROS (+)   Anesthesia Other Findings   Reproductive/Obstetrics                            Anesthesia Physical Anesthesia Plan  ASA: III  Anesthesia Plan: General   Post-op Pain Management:    Induction: Intravenous  PONV Risk Score and Plan: 4 or greater and Ondansetron, Dexamethasone, Propofol, Midazolam, Scopolamine patch - Pre-op and Treatment may vary due to age or medical condition  Airway Management Planned: Oral ETT  Additional Equipment: Arterial line  Intra-op Plan:   Post-operative Plan: Extubation in OR  Informed Consent: I have reviewed the patients History and Physical, chart, labs and discussed the procedure including the risks, benefits and alternatives for the proposed anesthesia with the patient or authorized representative who has indicated his/her understanding and acceptance.     Plan Discussed with: CRNA  Anesthesia Plan Comments:        Anesthesia Quick Evaluation

## 2016-12-26 NOTE — Anesthesia Procedure Notes (Signed)
Arterial Line Insertion Performed by: Barrington Ellison, CRNA  Preanesthetic checklist: patient identified and IV checked Patient sedated Left, radial was placed Catheter size: 20 G Hand hygiene performed  and maximum sterile barriers used   Attempts: 1 Procedure performed without using ultrasound guided technique. Following insertion, dressing applied and Biopatch. Post procedure assessment: normal  Patient tolerated the procedure well with no immediate complications.

## 2016-12-26 NOTE — Anesthesia Procedure Notes (Signed)
Procedure Name: Intubation Date/Time: 12/26/2016 2:07 PM Performed by: Candis Shine Pre-anesthesia Checklist: Patient identified, Emergency Drugs available, Suction available and Patient being monitored Patient Re-evaluated:Patient Re-evaluated prior to induction Oxygen Delivery Method: Circle System Utilized Preoxygenation: Pre-oxygenation with 100% oxygen Induction Type: IV induction Ventilation: Mask ventilation without difficulty Laryngoscope Size: Mac and 3 Grade View: Grade I Tube type: Oral Tube size: 7.5 mm Number of attempts: 1 Airway Equipment and Method: Stylet Placement Confirmation: ETT inserted through vocal cords under direct vision,  positive ETCO2 and breath sounds checked- equal and bilateral Secured at: 22 cm Tube secured with: Tape Dental Injury: Teeth and Oropharynx as per pre-operative assessment

## 2016-12-26 NOTE — Transfer of Care (Signed)
Immediate Anesthesia Transfer of Care Note  Patient: Christine Garrett  Procedure(s) Performed: Procedure(s) with comments: THORACIC EIGHT TUMOR RESECTION, THORACIC SIX- THORACIC TEN POSTERIOR SPINAL FUSION (N/A) - THORACIC 8 TUMOR RESECTION, THORACIC 6- THORACIC 10 POSTERIOR SPINAL FUSION  Patient Location: PACU  Anesthesia Type:General  Level of Consciousness: sedated, drowsy and responds to stimulation  Airway & Oxygen Therapy: Patient Spontanous Breathing and Patient connected to nasal cannula oxygen  Post-op Assessment: Report given to RN and Post -op Vital signs reviewed and stable  Post vital signs: Reviewed and stable  Last Vitals:  Vitals:   12/26/16 1053 12/26/16 1808  BP: (!) 146/87 (!) 177/89  Pulse: 83 82  Resp: 20 17  Temp: 37.3 C (!) 36.3 C    Last Pain: There were no vitals filed for this visit.    Patients Stated Pain Goal: 2 (12/45/80 9983)  Complications: No apparent anesthesia complications

## 2016-12-26 NOTE — Telephone Encounter (Signed)
See note

## 2016-12-26 NOTE — H&P (Signed)
Christine Garrett is an 61 y.o. female.   Chief Complaint: back pain HPI: I was contacted by Dr. Gala Romney last week about an abnormal result on CT of the chest. She has a lytic lesion at T8. Thoracic mri revealed an enhancing mass on the left side at T8 invading the canal and compressing the spinal cord. She is taken to the operating room for tumor resection and spine stabilization.   Past Medical History:  Diagnosis Date  . Arthritis    knees  . Carpal tunnel syndrome   . CHF (congestive heart failure) ([place])   . Diabetes mellitus    type 2  . GERD (gastroesophageal reflux disease)   . Heart murmur    "Little, No concerns" per Dr  Dannielle Burn pt reported.  . Hypertension    controlled using a guided approch with plasma renin activity  . Neuropathy   . Peripheral vascular disease ([place])   . Sleep apnea    Uses CPAP    Past Surgical History:  Procedure Laterality Date  . BIOPSY  11/28/2016   Procedure: BIOPSY;  Surgeon: Rogene Houston, MD;  [facility];  Service: Endoscopy;;  gastric  . [place]  . CERVICAL [place]E BIOPSY     cervical lesion  . ESOPHAGOGASTRODUODENOSCOPY (EGD) WITH PROPOFOL N/A 11/28/2016   Procedure: ESOPHAGOGASTRODUODENOSCOPY (EGD) WITH PROPOFOL;  Surgeon: Rogene Houston, MD;  [facility];  Service: Endoscopy;  Laterality: N/A;  9:25  . lipoma removal     right shoulder 2001  . MULTIPLE EXTRACTIONS WITH ALVEOLOPLASTY Bilateral 08/24/2012   Procedure: MULTIPLE EXTRACTION WITH ALVEOLOPLASTY BIOPSY OF RIGHT AND LEFT MANDIBLE ;  Surgeon: Gae Bon, DDS;  Location: [facility];  Service: Oral Surgery;  Laterality: Bilateral;  . ROTATOR CUFF REPAIR     right shoulder  . TUBAL LIGATION      Family History  Problem Relation Age of Onset  . Diabetes Mother   . Hypertension Mother   . Heart disease Mother   . Diabetes Father   . Heart disease Sister   . Diabetes Sister    Social History:  reports that she quit smoking about 5 years ago. Her  smoking use included Cigarettes. She has a 3.00 pack-year smoking history. She has never used smokeless tobacco. She reports that she does not drink alcohol or use drugs.  Allergies:  Allergies  Allergen Reactions  . No Known Allergies     Medications Prior to Admission  Medication Sig Dispense Refill  . amoxicillin (AMOXIL) 500 MG capsule Take 1,000 mg by mouth 2 (two) times daily.  0  . chlorthalidone (HYGROTON) 25 MG tablet Take 25 mg by mouth 2 (two) times daily.   11  . clarithromycin (BIAXIN) 500 MG tablet Take 500 mg by mouth 2 (two) times daily.  0  . dexamethasone (DECADRON) 6 MG tablet Take 6 mg by mouth daily.   0  . dicyclomine (BENTYL) 10 MG capsule Take 1 capsule (10 mg total) by mouth 3 (three) times daily as needed for spasms. 90 capsule 5  . diltiazem (CARDIZEM CD) 300 MG 24 hr capsule Take 300mg s by mouth daily  3  . enalapril (VASOTEC) 20 MG tablet Take 20 mg by mouth 2 (two) times daily.     . insulin degludec (TRESIBA FLEXTOUCH) 100 UNIT/ML SOPN FlexTouch Pen Inject 0.5 mLs (50 Units total) into the skin daily at 10 pm. 5 pen 2  . metFORMIN (GLUCOPHAGE) 1000 MG  tablet Take 1,000 mg by mouth 2 (two) times daily.     [place] Kitchen omeprazole (PRILOSEC) 20 MG capsule Take 20 mg by mouth 2 (two) times daily.    . potassium chloride SA (K-DUR,KLOR-CON) 20 MEQ tablet Take 1 tablet (20 mEq total) by mouth daily. 30 tablet 2  . traMADol (ULTRAM) 50 MG tablet Take 50 mg by mouth 2 (two) times daily as needed for moderate pain.    [place] Kitchen bismuth-metronidazole-tetracycline (PYLERA) 140-125-125 MG capsule Take 3 capsules by mouth 4 (four) times daily -  before meals and at bedtime. (Patient not taking: Reported on 12/25/2016) 120 capsule 0  . Insulin Pen Needle (B-D ULTRAFINE III SHORT PEN) 31G X 8 MM MISC 1 each by Does not apply route as directed. 100 each 3    Results for orders placed or performed during the hospital encounter of 12/26/16 (from the past 48 hour(s))  Type and screen [place]     Status: None   Collection Time: 12/26/16 10:51 AM  Result Value Ref Range   ABO/RH(D) B POS    Antibody Screen NEG    Sample Expiration 12/29/2016   ABO/Rh     Status: None   Collection Time: 12/26/16 10:51 AM  Result Value Ref Range   ABO/RH(D) B POS   CBC     Status: Abnormal   Collection Time: 12/26/16 10:54 AM  Result Value Ref Range   WBC 12.5 (H) 4.0 - 10.5 K/uL   RBC 4.02 3.87 - 5.11 MIL/uL   Hemoglobin 12.7 12.0 - 15.0 g/dL   HCT 36.0 36.0 - 46.0 %   MCV 89.6 78.0 - 100.0 fL   MCH 31.6 26.0 - 34.0 pg   MCHC 35.3 30.0 - 36.0 g/dL   RDW 12.4 11.5 - 15.5 %   Platelets 313 150 - 400 K/uL  Glucose, capillary     Status: Abnormal   Collection Time: 12/26/16 11:02 AM  Result Value Ref Range   Glucose-Capillary 460 (H) 65 - 99 mg/dL   Comment 1 Notify RN    Comment 2 Document in Chart   Glucose, capillary     Status: Abnormal   Collection Time: 12/26/16 12:44 PM  Result Value Ref Range   Glucose-Capillary 396 (H) 65 - 99 mg/dL   No results found.  ROS  Blood pressure (!) 146/87, pulse 83, temperature 99.1 F (37.3 C), resp. rate 20, height 5\' 8"  (1.727 m), weight 95.7 kg (211 lb), SpO2 96 %. Physical Exam  Constitutional: She is oriented to person, place, and time. She appears well-developed and well-nourished. No distress.  HENT:  Head: Normocephalic and atraumatic.  Eyes: Pupils are equal, round, and reactive to light. Conjunctivae and EOM are normal.  Neck: Normal range of motion. Neck supple.  Cardiovascular: Normal rate, regular rhythm, normal heart sounds and intact distal pulses.   Respiratory: Effort normal and breath sounds normal.  GI: Soft. Bowel sounds are normal.  Musculoskeletal: Normal range of motion.  Neurological: She is alert and oriented to person, place, and time. She has normal reflexes. She displays normal reflexes. No cranial nerve deficit. She exhibits normal muscle tone. Coordination abnormal.  Skin: Skin is warm and  dry.  Psychiatric: She has a normal mood and affect. Her behavior is normal. Judgment and thought content normal.     Assessment/Plan OR for tumor resection, and spine stabilization. Risks including bleeding, infection, paralysis, weakness, bowel and or bladder dysfunction, fusion failure, hardware failure, inability to resect the  tumor, and other risks were discussed.She understands and wishes to proceed.   Logyn Kendrick L, MD 12/26/2016, 1:12 PM

## 2016-12-27 ENCOUNTER — Encounter (HOSPITAL_COMMUNITY): Payer: Self-pay | Admitting: Neurosurgery

## 2016-12-27 LAB — POCT I-STAT 4, (NA,K, GLUC, HGB,HCT)
GLUCOSE: 137 mg/dL — AB (ref 65–99)
Glucose, Bld: 202 mg/dL — ABNORMAL HIGH (ref 65–99)
Glucose, Bld: 170 mg/dL — ABNORMAL HIGH (ref 65–99)
HCT: 35 % — ABNORMAL LOW (ref 36.0–46.0)
HCT: 31 % — ABNORMAL LOW (ref 36.0–46.0)
HEMATOCRIT: 33 % — AB (ref 36.0–46.0)
HEMOGLOBIN: 11.2 g/dL — AB (ref 12.0–15.0)
Hemoglobin: 11.9 g/dL — ABNORMAL LOW (ref 12.0–15.0)
Hemoglobin: 10.5 g/dL — ABNORMAL LOW (ref 12.0–15.0)
POTASSIUM: 3.8 mmol/L (ref 3.5–5.1)
Potassium: 4.1 mmol/L (ref 3.5–5.1)
Potassium: 4 mmol/L (ref 3.5–5.1)
SODIUM: 137 mmol/L (ref 135–145)
SODIUM: 137 mmol/L (ref 135–145)
SODIUM: 137 mmol/L (ref 135–145)

## 2016-12-27 LAB — GLUCOSE, CAPILLARY
GLUCOSE-CAPILLARY: 114 mg/dL — AB (ref 65–99)
GLUCOSE-CAPILLARY: 118 mg/dL — AB (ref 65–99)
GLUCOSE-CAPILLARY: 124 mg/dL — AB (ref 65–99)
GLUCOSE-CAPILLARY: 149 mg/dL — AB (ref 65–99)
GLUCOSE-CAPILLARY: 197 mg/dL — AB (ref 65–99)
GLUCOSE-CAPILLARY: 312 mg/dL — AB (ref 65–99)
GLUCOSE-CAPILLARY: 314 mg/dL — AB (ref 65–99)
GLUCOSE-CAPILLARY: 318 mg/dL — AB (ref 65–99)
GLUCOSE-CAPILLARY: 320 mg/dL — AB (ref 65–99)
Glucose-Capillary: 145 mg/dL — ABNORMAL HIGH (ref 65–99)
Glucose-Capillary: 126 mg/dL — ABNORMAL HIGH (ref 65–99)
Glucose-Capillary: 103 mg/dL — ABNORMAL HIGH (ref 65–99)
Glucose-Capillary: 167 mg/dL — ABNORMAL HIGH (ref 65–99)
Glucose-Capillary: 288 mg/dL — ABNORMAL HIGH (ref 65–99)
Glucose-Capillary: 324 mg/dL — ABNORMAL HIGH (ref 65–99)

## 2016-12-27 LAB — HEMOGLOBIN A1C
Hgb A1c MFr Bld: 13.3 % — ABNORMAL HIGH (ref 4.8–5.6)
Mean Plasma Glucose: 335 mg/dL

## 2016-12-27 MED ORDER — INSULIN ASPART 100 UNIT/ML ~~LOC~~ SOLN
0.0000 [IU] | Freq: Three times a day (TID) | SUBCUTANEOUS | Status: DC
  Administered 2016-12-27: 7 [IU] via SUBCUTANEOUS

## 2016-12-27 MED ORDER — INSULIN GLARGINE 100 UNIT/ML ~~LOC~~ SOLN: 40.0000 [IU] | Freq: Every day | SUBCUTANEOUS | Status: DC

## 2016-12-27 MED ORDER — INSULIN ASPART 100 UNIT/ML ~~LOC~~ SOLN
0.0000 [IU] | Freq: Every day | SUBCUTANEOUS | Status: DC
  Administered 2016-12-27: 4 [IU] via SUBCUTANEOUS

## 2016-12-27 MED ORDER — INSULIN GLARGINE 100 UNIT/ML ~~LOC~~ SOLN
30.0000 [IU] | Freq: Once | SUBCUTANEOUS | Status: AC
  Administered 2016-12-27: 30 [IU] via SUBCUTANEOUS
  Filled 2016-12-27: qty 0.3

## 2016-12-27 MED ORDER — INSULIN NPH (HUMAN) (ISOPHANE) 100 UNIT/ML ~~LOC~~ SUSP
10.0000 [IU] | Freq: Once | SUBCUTANEOUS | Status: AC
Start: 2016-12-27 — End: 2016-12-27
  Administered 2016-12-27: 10 [IU] via SUBCUTANEOUS
  Filled 2016-12-27: qty 10

## 2016-12-27 MED ORDER — INSULIN ASPART 100 UNIT/ML ~~LOC~~ SOLN
4.0000 [IU] | Freq: Three times a day (TID) | SUBCUTANEOUS | Status: DC
  Administered 2016-12-27 – 2016-12-30 (×8): 4 [IU] via SUBCUTANEOUS

## 2016-12-27 MED ORDER — INSULIN GLARGINE 100 UNIT/ML ~~LOC~~ SOLN
50.0000 [IU] | Freq: Every day | SUBCUTANEOUS | Status: DC
  Administered 2016-12-28 – 2016-12-29 (×2): 50 [IU] via SUBCUTANEOUS
  Filled 2016-12-27 (×4): qty 0.5

## 2016-12-27 MED ORDER — INSULIN ASPART 100 UNIT/ML ~~LOC~~ SOLN
0.0000 [IU] | Freq: Three times a day (TID) | SUBCUTANEOUS | Status: DC
  Administered 2016-12-28: 5 [IU] via SUBCUTANEOUS
  Administered 2016-12-28: 8 [IU] via SUBCUTANEOUS
  Administered 2016-12-28 – 2016-12-29 (×2): 5 [IU] via SUBCUTANEOUS
  Administered 2016-12-29: 11 [IU] via SUBCUTANEOUS
  Administered 2016-12-29: 3 [IU] via SUBCUTANEOUS
  Administered 2016-12-30: 2 [IU] via SUBCUTANEOUS

## 2016-12-27 MED ORDER — INSULIN ASPART 100 UNIT/ML ~~LOC~~ SOLN
1.0000 [IU] | SUBCUTANEOUS | Status: DC
  Administered 2016-12-27: 2 [IU] via SUBCUTANEOUS

## 2016-12-27 MED ORDER — SODIUM CHLORIDE 0.9 % IV SOLN
INTRAVENOUS | Status: DC
  Administered 2016-12-27: 2.3 [IU]/h via INTRAVENOUS
  Filled 2016-12-27: qty 1

## 2016-12-27 MED ORDER — INSULIN GLARGINE 100 UNIT/ML ~~LOC~~ SOLN
10.0000 [IU] | SUBCUTANEOUS | Status: DC
  Administered 2016-12-27: 10 [IU] via SUBCUTANEOUS
  Filled 2016-12-27: qty 0.1

## 2016-12-27 NOTE — Progress Notes (Signed)
Inpatient Diabetes Program Recommendations  AACE/ADA: New Consensus Statement on Inpatient Glycemic Control (2015)  Target Ranges:  Prepandial:   less than 140 mg/dL      Peak postprandial:   less than 180 mg/dL (1-2 hours)      Critically ill patients:  140 - 180 mg/dL   Lab Results  Component Value Date   GLUCAP 197 (H) 12/27/2016   HGBA1C 13.3 (H) 12/26/2016    Review of Glycemic Control  Spoke with patient about current A1c level, 13.3%. Patient sees Dr. Dorris Fetch, [place] in [place]. [place] last saw Dr. Dorris Fetch the end of June and had a large increase in [place] dose up to 50 units. This was prior to patient being prescribed Decadron prior to admission and surgical procedure which drove glucose levels up. Patient lives with family and they are working together in lowering glucose trends by following a nutritionist diet. Family has not noticed a difference in glucose levels. Spoke with patient about keeping track of glucose trends and contacting Dr. Liliane Channel office for further recommendations outpatient.  Thanks,  Tama Headings RN, MSN, [facility] Inpatient Diabetes Coordinator Team Pager [phone] (8a-5p)

## 2016-12-27 NOTE — Progress Notes (Addendum)
Inpatient Diabetes Program Recommendations  AACE/ADA: New Consensus Statement on Inpatient Glycemic Control (2015)  Target Ranges:  Prepandial:   less than 140 mg/dL      Peak postprandial:   less than 180 mg/dL (1-2 hours)      Critically ill patients:  140 - 180 mg/dL   Lab Results  Component Value Date   GLUCAP 167 (H) 12/27/2016   HGBA1C 13.3 (H) 12/26/2016   Review of Glycemic Control  Diabetes history: DM 2 Outpatient Diabetes medications: Tresiba 50 units, Metformin 1000 mg BID Current orders for Inpatient glycemic control: Lantus 10 units, Novolog 1-3 units Q4 hours, Metformin 1000 mg BID  Inpatient Diabetes Program Recommendations:    Patient with diet order transitioning to SQ insulin from IV gtt. Based on glucose trends and insulin gtt rate, consider switching patient to Glycemic control order set and order Novolog Sensitive Correction 0-9 units tid + HS scale 0-5 units, Patient takes 50 units of basal insulin at home. Consider increasing Lantus to at least half of home dose, Lantus 25 units, Consider giving additional units of Lantus today.  A1c 13.3%, uncontrolled at home. Patient also had A1c drawn in March at a value of 12.7%  13:45pm  Due to patient trends Increase Lantus to 40 give additional 30 units today start meal coverage Novolog 4 units.   Thanks,  Tama Headings RN, MSN, [facility] Inpatient Diabetes Coordinator Team Pager [phone] (8a-5p)

## 2016-12-27 NOTE — Progress Notes (Signed)
Discussed blood sugars and diabetes educators recommendations with Tomasa Rand, PA, Orders received.

## 2016-12-27 NOTE — Evaluation (Signed)
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  PMH positive for CHF, GERD, DM, neuropathy, sleep apnea and HTN.   Clinical Impression  Patient presents with decreased independence with mobility due to deficits listed in PT problem list.  She will benefit from skilled PT in the acute setting to allow return home with family support and follow up HHPT.     Follow Up Recommendations Home health PT    Equipment Recommendations  Rolling walker with 5" wheels;3in1 (PT)    Recommendations for Other Services       Precautions / Restrictions Precautions Precautions: Fall;Back Precaution Booklet Issued: No Precaution Comments: but educated in log roll technique for in/out of bed      Mobility  Bed Mobility Overal bed mobility: Needs Assistance Bed Mobility: Rolling;Sidelying to Sit;Sit to Sidelying Rolling: Min guard Sidelying to sit: Min assist     Sit to sidelying: Min assist General bed mobility comments: cues for technique, assist to lift trunk, assist to bring legs into bed  Transfers Overall transfer level: Needs assistance Equipment used: Rolling walker (2 wheeled) Transfers: Sit to/from Stand Sit to Stand: Min guard         General transfer comment: cues for hand placement  Ambulation/Gait Ambulation/Gait assistance: Min guard;Min assist Ambulation Distance (Feet): 100 Feet Assistive device: Rolling walker (2 wheeled) Gait Pattern/deviations: Step-through pattern;Decreased stride length     General Gait Details: slow pace, cues and assist for turns with RW; reports felt R hip weakness with gait  Stairs            Wheelchair Mobility    Modified Rankin (Stroke Patients Only)       Balance Overall balance  assessment: Needs assistance   Sitting balance-Leahy Scale: Fair       Standing balance-Leahy Scale: Poor Standing balance comment: UE support for balance                             Pertinent Vitals/Pain Pain Assessment: Faces Faces Pain Scale: Hurts little more Pain Location: incision  Pain Descriptors / Indicators: Tender Pain Intervention(s): Monitored during session;Repositioned    Home Living Family/patient expects to be discharged to:: Private residence Living Arrangements: Children Available Help at Discharge: Family;Available PRN/intermittently Type of Home: House Home Access: Stairs to enter Entrance Stairs-Rails: None Entrance Stairs-Number of Steps: 2 small steps at daughter's home Home Layout: One level Home Equipment: None      Prior Function Level of Independence: Independent         Comments: was keeping her grandchildren     Hand Dominance        Extremity/Trunk Assessment   Upper Extremity Assessment Upper Extremity Assessment: Defer to OT evaluation    Lower Extremity Assessment Lower Extremity Assessment: RLE deficits/detail;LLE deficits/detail RLE Deficits / Details: AROM WFL, strength hip flexion 3/5, knee extension 4/5, ankle DF 4/5 RLE Sensation: history of peripheral neuropathy;decreased light touch LLE Deficits / Details: AROM WFL, strength hip flexion 3/5, knee extension 4/5, ankle DF 4/5 LLE Sensation: history of peripheral neuropathy;decreased light touch       Communication   Communication: No difficulties  Cognition Arousal/Alertness: Awake/alert Behavior During Therapy: WFL for tasks assessed/performed Overall Cognitive Status: Within Functional Limits for tasks assessed  General Comments      Exercises     Assessment/Plan    PT Assessment Patient needs continued PT services  PT Problem List Decreased strength;Decreased mobility;Impaired  sensation;Decreased balance;Decreased knowledge of use of DME;Decreased knowledge of precautions       PT Treatment Interventions DME instruction;Therapeutic activities;Gait training;Balance training;Functional mobility training;Therapeutic exercise;Patient/family education    PT Goals (Current goals can be found in the Care Plan section)  Acute Rehab PT Goals Patient Stated Goal: To return to independent PT Goal Formulation: With patient Time For Goal Achievement: 01/03/17 Potential to Achieve Goals: Good    Frequency Min 5X/week   Barriers to discharge        Co-evaluation               AM-PAC PT "6 Clicks" Daily Activity  Outcome Measure Difficulty turning over in bed (including adjusting bedclothes, sheets and blankets)?: Total Difficulty moving from lying on back to sitting on the side of the bed? : Total Difficulty sitting down on and standing up from a chair with arms (e.g., wheelchair, bedside commode, etc,.)?: Total Help needed moving to and from a bed to chair (including a wheelchair)?: A Little Help needed walking in hospital room?: A Little Help needed climbing 3-5 steps with a railing? : A Lot 6 Click Score: 11    End of Session Equipment Utilized During Treatment: Gait belt Activity Tolerance: Patient limited by fatigue Patient left: in bed;with call bell/phone within reach;with family/visitor present   PT Visit Diagnosis: Other abnormalities of gait and mobility (R26.89);Muscle weakness (generalized) (M62.81)    Time: 7494-4967 PT Time Calculation (min) (ACUTE ONLY): 23 min   Charges:   PT Evaluation $PT Eval Moderate Complexity: 1 Procedure PT Treatments $Gait Training: 8-22 mins   PT G CodesMagda Kiel, [place] [phone] 12/27/2016   Reginia Naas 12/27/2016, 4:49 PM

## 2016-12-27 NOTE — Progress Notes (Signed)
Patient ID: Christine Garrett, female   DOB: May 28, 1956, 61 y.o.   MRN: [id number] BP 130/79   Pulse 83   Temp 98.3 F (36.8 C) (Oral)   Resp 15   Ht 5\' 8"  (1.727 m)   Wt 96.8 kg (213 lb 6.5 oz)   SpO2 100%   BMI 32.45 kg/m  Alert and oriented x 4, speech is clear and fluent Moving all extremities well Wound is clean dry, no signs of infection. Will transfer out to day

## 2016-12-27 NOTE — Progress Notes (Signed)
Noon CBG 288, insulin gtt restarted per order.

## 2016-12-27 NOTE — Progress Notes (Signed)
eLink Physician-Brief Progress Note Patient Name: Christine Garrett DOB: 10-21-1955 MRN: [id number]   Date of Service  12/27/2016  HPI/Events of Note  Consulted by neurosurgery to manage diabetes. Previously patient on insulin drip & transitioned to subcutaneous insulin. Diabetic educator following. Patient has history of difficult to control diabetes & is followed by endocrinology.  Received Lantus 30u this afternoon & also has mealtime insulin.   eICU Interventions  1. Discontinuing metformin 2. Patient to start Lantus 50 units subcutaneous daily at bedtime starting tomorrow 3. Transitioning from sensitive to moderate dose sliding scale insulin algorithm 4. NPH 10 units subcutaneous 1 now 5. Accu-Chek at 3 AM      Intervention Category Major Interventions: Hyperglycemia - active titration of insulin therapy  Tera Partridge 12/27/2016, 10:17 PM

## 2016-12-28 DIAGNOSIS — D492 Neoplasm of unspecified behavior of bone, soft tissue, and skin: Secondary | ICD-10-CM

## 2016-12-28 DIAGNOSIS — E119 Type 2 diabetes mellitus without complications: Secondary | ICD-10-CM

## 2016-12-28 DIAGNOSIS — Z794 Long term (current) use of insulin: Secondary | ICD-10-CM

## 2016-12-28 LAB — GLUCOSE, CAPILLARY
GLUCOSE-CAPILLARY: 219 mg/dL — AB (ref 65–99)
GLUCOSE-CAPILLARY: 173 mg/dL — AB (ref 65–99)
Glucose-Capillary: 175 mg/dL — ABNORMAL HIGH (ref 65–99)
Glucose-Capillary: 224 mg/dL — ABNORMAL HIGH (ref 65–99)
Glucose-Capillary: 252 mg/dL — ABNORMAL HIGH (ref 65–99)

## 2016-12-28 MED ORDER — FLEET ENEMA 7-19 GM/118ML RE ENEM
1.0000 | ENEMA | Freq: Every day | RECTAL | Status: DC | PRN
  Administered 2016-12-28: 1 via RECTAL
  Filled 2016-12-28: qty 1

## 2016-12-28 NOTE — Progress Notes (Signed)
No issues overnight. C/O abd pain from constipation. Appropriate back pain. Transferring from bed to chair with minimal assistance.  EXAM:  BP 101/63   Pulse 77   Temp 97.9 F (36.6 C) (Oral)   Resp (!) 23   Ht 5\' 8"  (1.727 m)   Wt 96.8 kg (213 lb 6.5 oz)   SpO2 100%   BMI 32.45 kg/m   Awake, alert, oriented  Speech fluent, appropriate  CN grossly intact  5/5 BUE/BLE  Wound c/d/i  CBG 175-324  IMPRESSION:  61 y.o. female POD# 2 thoracic decompression/stabilization for tumor, recovering appropriately. Remains hyperglycemic.  PLAN: - Can transfer to floor - Medicine assistance for hyperglycemia

## 2016-12-28 NOTE — Consult Note (Signed)
PULMONARY / CRITICAL CARE MEDICINE   Name: Christine Garrett MRN: [id number] DOB: December 20, 1955    ADMISSION DATE:  12/26/2016 CONSULTATION DATE:  12/28/16  REFERRING MD:  Dr. Christella Noa   HISTORY OF PRESENT ILLNESS:   61 yo female with thoracic tumor s/p resection and spine stabilization in the OR. PCCM consulted for medical management, specifically diabetes since patient previously on insulin drip and transitioned to subcutaneous insulin.   PAST MEDICAL HISTORY :  She  has a past medical history of Arthritis; Carpal tunnel syndrome; CHF (congestive heart failure) ([place]); Diabetes mellitus; GERD (gastroesophageal reflux disease); Heart murmur; Hypertension; Neuropathy; Peripheral vascular disease ([place]); and Sleep apnea.  PAST SURGICAL HISTORY: She  has a past surgical history that includes lipoma removal; Rotator cuff repair; Cervical cone biopsy; BTL; Tubal ligation; Multiple extractions with alveoloplasty (Bilateral, 08/24/2012); Esophagogastroduodenoscopy (egd) with propofol (N/A, 11/28/2016); biopsy (11/28/2016); and Posterior lumbar fusion 4 level (N/A, 12/26/2016).  Allergies  Allergen Reactions  . No Known Allergies     No current facility-administered medications on file prior to encounter.    Current Outpatient Prescriptions on File Prior to Encounter  Medication Sig  . chlorthalidone (HYGROTON) 25 MG tablet Take 25 mg by mouth 2 (two) times daily.   Marland Kitchen dicyclomine (BENTYL) 10 MG capsule Take 1 capsule (10 mg total) by mouth 3 (three) times daily as needed for spasms.  Marland Kitchen diltiazem (CARDIZEM CD) 300 MG 24 hr capsule Take 300mg s by mouth daily  . enalapril (VASOTEC) 20 MG tablet Take 20 mg by mouth 2 (two) times daily.   . insulin degludec (TRESIBA FLEXTOUCH) 100 UNIT/ML SOPN FlexTouch Pen Inject 0.5 mLs (50 Units total) into the skin daily at 10 pm.  . metFORMIN (GLUCOPHAGE) 1000 MG tablet Take 1,000 mg by mouth 2 (two) times daily.   . potassium chloride SA (K-DUR,KLOR-CON) 20 MEQ  tablet Take 1 tablet (20 mEq total) by mouth daily.  . Insulin Pen Needle (B-D ULTRAFINE III SHORT PEN) 31G X 8 MM MISC 1 each by Does not apply route as directed.    FAMILY HISTORY:  Her indicated that her mother is deceased. She indicated that her father is deceased. She indicated that her sister is alive. She indicated that her child is alive. She indicated that her other is alive.    SOCIAL HISTORY: She  reports that she quit smoking about 5 years ago. Her smoking use included Cigarettes. She has a 3.00 pack-year smoking history. She has never used smokeless tobacco. She reports that she does not drink alcohol or use drugs.  REVIEW OF SYSTEMS:   No fever/chills, CP, SOB, n/v. + constipated. Back pain  SUBJECTIVE:  Pain is controlled. Feels like needs to move bowels.  VITAL SIGNS: BP 126/70   Pulse 71   Temp 98.5 F (36.9 C) (Oral)   Resp 18   Ht 5\' 8"  (1.727 m)   Wt 96.8 kg (213 lb 6.5 oz)   SpO2 99%   BMI 32.45 kg/m   INTAKE / OUTPUT: I/O last 3 completed shifts: In: 3215.7 [I.V.:3215.7] Out: 985 [Urine:635; Blood:350]  PHYSICAL EXAMINATION: General:  Laying in bed comfortably, in NAD Neuro:  Alert and awake, oriented. No focal deficits HEENT:  [place], AT. EOMI. MMM Cardiovascular:  RRR, soft systolic murmur Lungs:  CTAB, normal effort on room air Abdomen:  Soft, nontender, nondistended, + bowel sounds Musculoskeletal:  Moving all extremities Skin:  Warm and dry   LABS:  BMET  Recent Labs Lab 12/26/16 1504 12/26/16 1609 12/26/16 1720  NA 137 137 137  K 3.8 4.1 4.0  GLUCOSE 202* 170* 137*    Electrolytes No results for input(s): CALCIUM, MG, PHOS in the last 168 hours.  CBC  Recent Labs Lab 12/26/16 1054 12/26/16 1504 12/26/16 1609 12/26/16 1720  WBC 12.5*  --   --   --   HGB 12.7 11.9* 11.2* 10.5*  HCT 36.0 35.0* 33.0* 31.0*  PLT 313  --   --   --    Glucose  Recent Labs Lab 12/27/16 1401 12/27/16 1607 12/27/16 1932 12/27/16 2042  12/27/16 2247 12/28/16 0348  GLUCAP 320* 314* 318* 324* 312* 175*    Imaging No results found.   SIGNIFICANT EVENTS: 7/12 admitted, OR for thoracic tumor resection  LINES/TUBES: ETT 7/12 - 7/12 PIV  DISCUSSION: 61 yo female with thoracic tumor s/p resection and spine stabilization in the OR. PCCM consulted for medical management, specifically diabetes since patient previously on insulin drip and transitioned to subcutaneous insulin.   ASSESSMENT / PLAN:  Type 2 Diabetes: Uncontrolled. Last A1c 13.3 on 12/26/16. Followed by endocrinology as outpt. At home on metformin 1000mg  BID and tresiba 50U qhs. CBGs have been in the mid 300s.  - Hold metformin - Start lantus 50U qhs on 12/28/16 - mod SSI - CBGs AC HS   Remainder of care per primary service and other consultants.  Bufford Lope, DO PGY-2, [facility] 12/28/2016 4:26 AM   Attending Note:  I have examined patient, reviewed labs, studies and notes. I have discussed the case with Dr Shawna Orleans, and I agree with the data and plans as amended above.   61 year old woman with a history of diabetes, GERD, hypertension, mild sleep apnea (not on CPAP). She was found to have a T8 left-sided tumor invading the canal compressing the spinal cord. She went to the OR for tumor resection and spine stabilization on 7/12. She was extubated, is recovering. Surgical pathology is still pending. Postoperatively her CBGs have been consistently elevated into the 300s since she was transitioned off insulin drip into a subcutaneous protocol. Blood pressure has been adequately controlled on home enalapril, diltiazem, clonidine. PCCM consulted to assist with medical management of her diabetes. On my evaluation she is up to chair, wakes easily and interacts. She complains only of some constipation since admission. Heart is regular, lungs clear bilaterally without any wheezes or crackles, abdomen is benign. Positive bowel sounds. We will hold her  metformin until taking better by mouth. Start Lantus given her chronic insulin needs, 50 units daily at bedtime. Change her to the moderate sliding scale insulin protocol and follow CBG for improvement. We will continue her current blood pressure management. No indication to start CPAP. I do not see a polysomnogram to review. She describes her sleep apnea as "mild". Start a bowel regimen given her constipation   Baltazar Apo, MD, PhD 12/28/2016, 10:19 AM [facility] [phone] or if no answer [phone]

## 2016-12-28 NOTE — Progress Notes (Signed)
Physical Therapy Treatment Patient Details Name: Christine Garrett MRN: [id number] DOB: 08-21-1955 Today's Date: 12/28/2016    History of Present Illness Patient is a 61 y/o female admitted due to T8 lytic lesion with spinal cord compression, now s/p tumor resection and stabilization.  PMH positive for CHF, GERD, DM, neuropathy, sleep apnea and HTN.     PT Comments    Patient seen for mobility progression. Tolerated well, increased ambulation distance. Pt reports nasuea and did vomit x1 during session. Will continue to see and progress as tolerated.   Follow Up Recommendations  Home health PT     Equipment Recommendations  Rolling walker with 5" wheels;3in1 (PT)    Recommendations for Other Services       Precautions / Restrictions Precautions Precautions: Fall;Back Precaution Booklet Issued: No Precaution Comments: but educated in log roll technique for in/out of bed    Mobility  Bed Mobility               General bed mobility comments: received in chair  Transfers Overall transfer level: Needs assistance Equipment used: Rolling walker (2 wheeled) Transfers: Sit to/from Stand Sit to Stand: Min guard         General transfer comment: VCs for hand placement and upright posture  Ambulation/Gait Ambulation/Gait assistance: Supervision Ambulation Distance (Feet): 340 Feet Assistive device: Rolling walker (2 wheeled) Gait Pattern/deviations: Step-through pattern;Decreased stride length Gait velocity: decreased   General Gait Details: VCs for increased cadence   Stairs            Wheelchair Mobility    Modified Rankin (Stroke Patients Only)       Balance Overall balance assessment: Needs assistance Sitting-balance support: Feet supported Sitting balance-Leahy Scale: Fair     Standing balance support: Bilateral upper extremity supported Standing balance-Leahy Scale: Poor Standing balance comment: UE support for balance                             Cognition Arousal/Alertness: Awake/alert Behavior During Therapy: WFL for tasks assessed/performed Overall Cognitive Status: Within Functional Limits for tasks assessed                                        Exercises      General Comments General comments (skin integrity, edema, etc.): vomitted x1 during session      Pertinent Vitals/Pain Pain Assessment: Faces Faces Pain Scale: Hurts little more Pain Location: operative site Pain Descriptors / Indicators: Tender Pain Intervention(s): Monitored during session    Home Living                      Prior Function            PT Goals (current goals can now be found in the care plan section) Acute Rehab PT Goals Patient Stated Goal: To return to independent PT Goal Formulation: With patient Time For Goal Achievement: 01/03/17 Potential to Achieve Goals: Good Progress towards PT goals: Progressing toward goals    Frequency    Min 5X/week      PT Plan Current plan remains appropriate    Co-evaluation              AM-PAC PT "6 Clicks" Daily Activity  Outcome Measure  Difficulty turning over in bed (including adjusting bedclothes, sheets and blankets)?: Total Difficulty moving from lying on  back to sitting on the side of the bed? : Total Difficulty sitting down on and standing up from a chair with arms (e.g., wheelchair, bedside commode, etc,.)?: Total Help needed moving to and from a bed to chair (including a wheelchair)?: A Little Help needed walking in hospital room?: A Little Help needed climbing 3-5 steps with a railing? : A Lot 6 Click Score: 11    End of Session Equipment Utilized During Treatment: Gait belt Activity Tolerance: Patient limited by fatigue Patient left: in chair;with call bell/phone within reach Nurse Communication: Mobility status PT Visit Diagnosis: Other abnormalities of gait and mobility (R26.89);Muscle weakness (generalized) (M62.81)      Time: 7893-8101 PT Time Calculation (min) (ACUTE ONLY): 20 min  Charges:  $Gait Training: 8-22 mins                    G Codes:       Alben Deeds, PT DPT NCS [phone]    Duncan Dull 12/28/2016, 10:56 AM

## 2016-12-28 NOTE — Progress Notes (Signed)
Pt arrived to 5C15 via wheelchair.  VSS.  Pt alert and oriented with no complaints of pain.  Pt ambulated from wheelchair to chair.  Will continue to monitor.  Cori Razor, RN

## 2016-12-28 NOTE — Progress Notes (Signed)
Pt transferred to [place]. Assessment stable. Report called to [facility] RN

## 2016-12-29 LAB — GLUCOSE, CAPILLARY
GLUCOSE-CAPILLARY: 175 mg/dL — AB (ref 65–99)
Glucose-Capillary: 197 mg/dL — ABNORMAL HIGH (ref 65–99)
Glucose-Capillary: 211 mg/dL — ABNORMAL HIGH (ref 65–99)
Glucose-Capillary: 305 mg/dL — ABNORMAL HIGH (ref 65–99)

## 2016-12-29 NOTE — Progress Notes (Signed)
CSW acknowledged consult for assistance with nursing home placement. Chart review indicates pt from home and therapy recommends HHPT to follow and DC plan currently to return home.  CSW signing off, please consult if additional need arise.   Sharren Bridge, MSW, LCSW Clinical Social Work 12/29/2016 Weekend Coverage [phone]

## 2016-12-29 NOTE — Progress Notes (Signed)
Pt seen and examined.  No issues overnight. No complaints this morning Pain is manageable Working on ambulating  EXAM: Temp:  [97.9 F (36.6 C)-98.7 F (37.1 C)] 98.2 F (36.8 C) (07/15 0505) Pulse Rate:  [72-93] 86 (07/15 0505) Resp:  [14-24] 18 (07/15 0505) BP: (99-145)/(56-85) 121/70 (07/15 0505) SpO2:  [95 %-100 %] 100 % (07/15 0505) Intake/Output      07/14 0701 - 07/15 0700 07/15 0701 - 07/16 0700   P.O. 480    Total Intake(mL/kg) 480 (5)    Net +480          Urine Occurrence 4 x     Awake and alert Follows commands throughout MAEW with good strength Wound c/d/i  Plan Making good progress Sugars are improving as well (most recent 197 this am). Appreciate PCCM/TH assistance with management Continue to work on mobilization

## 2016-12-29 NOTE — Evaluation (Signed)
Occupational Therapy Evaluation Patient Details Name: Christine Garrett MRN: [id number] DOB: 05-30-1956 Today's Date: 12/29/2016    History of Present Illness Patient is a 61 y/o female admitted due to T8 lytic lesion with spinal cord compression, now s/p tumor resection and stabilization.  PMH positive for CHF, GERD, DM, neuropathy, sleep apnea and HTN.    Clinical Impression   PTA, pt was independent and is planning to dc to her daughter's home. Currently, pt requires Mod A for LB ADLs and Min Guard A for functional mobility using RW. Provided education and handouts on LB ADLs with AE, toilet transfer, and tub transfer with 3N1; pt demonstrated and verbalized understanding. Feel pt would benefit from further acute OT to increase safety and independence with ADLs and functional mobility. Recommend dc home once medically stable per physician.     Follow Up Recommendations  No OT follow up;Supervision/Assistance - 24 hour    Equipment Recommendations  3 in 1 bedside commode    Recommendations for Other Services PT consult     Precautions / Restrictions Precautions Precautions: Fall;Back Precaution Booklet Issued: Yes (comment) Precaution Comments: Provided education and handout on back precautions and adherance durign ADLs (Pt able to recall 2/3 precautions at end of session) Restrictions Weight Bearing Restrictions: No      Mobility Bed Mobility               General bed mobility comments: On toilet upon arrival. Reviewed log roll techqniue and pt able to verblize all steps to complete bed mobility safely.   Transfers Overall transfer level: Needs assistance Equipment used: Rolling walker (2 wheeled) Transfers: Sit to/from Stand Sit to Stand: Min guard         General transfer comment: VCs for hand placement and upright posture    Balance Overall balance assessment: Needs assistance Sitting-balance support: Feet supported Sitting balance-Leahy Scale: Fair      Standing balance support: Bilateral upper extremity supported Standing balance-Leahy Scale: Poor Standing balance comment: UE support for balance                           ADL either performed or assessed with clinical judgement   ADL Overall ADL's : Needs assistance/impaired Eating/Feeding: Set up;Sitting   Grooming: Min guard;Standing   Upper Body Bathing: Minimal assistance;Sitting   Lower Body Bathing: Sit to/from stand;Moderate assistance   Upper Body Dressing : Set up;Supervision/safety;Sitting   Lower Body Dressing: Sit to/from stand;Moderate assistance Lower Body Dressing Details (indicate cue type and reason): Pt able to bring ankle up to knee to don socks. Provided education on donning underwears/pants with reacher. Pt verbalized understanding.  Toilet Transfer: Building surveyor for safety     Toileting - Clothing Manipulation Details (indicate cue type and reason): Provided education on toilet hygiene while adhering to back precautions   Tub/Shower Transfer Details (indicate cue type and reason): Provided education and handout on tub transfer with 3N1. Pt and daughter verbalized understanding Functional mobility during ADLs: Min guard;Rolling walker General ADL Comments: Provided pt with educaiton and hand out on back precautions and compensatory techniques to adhere to precautions during ADLs. Pt requiring VCs to maintain precautions and daughter confirms that pt requires reminders during ADLs. Feel pt would benefit from further practice before dc - possibly tomorrow.      Vision         Perception     Praxis      Pertinent Vitals/Pain Pain Assessment: Faces  Faces Pain Scale: Hurts a little bit Pain Location: operative site Pain Descriptors / Indicators: Tender Pain Intervention(s): Monitored during session     Hand Dominance Right   Extremity/Trunk Assessment Upper Extremity Assessment Upper Extremity Assessment: Overall  WFL for tasks assessed   Lower Extremity Assessment Lower Extremity Assessment: Defer to PT evaluation   Cervical / Trunk Assessment Cervical / Trunk Assessment: Other exceptions Cervical / Trunk Exceptions: s/p T8 tumor resection and stabilization   Communication Communication Communication: No difficulties   Cognition Arousal/Alertness: Awake/alert Behavior During Therapy: WFL for tasks assessed/performed Overall Cognitive Status: Within Functional Limits for tasks assessed                                 General Comments: Decreased safety awareness with back precautions   General Comments  Daughter present during session    Exercises     Shoulder Instructions      Home Living Family/patient expects to be discharged to:: Private residence Living Arrangements: Children Available Help at Discharge: Family;Available PRN/intermittently Type of Home: House Home Access: Stairs to enter [employer] of Steps: 2 small steps at daughter's home Entrance Stairs-Rails: None Home Layout: One level     Bathroom Shower/Tub: Teacher, early years/pre: Standard     Home Equipment: None          Prior Functioning/Environment Level of Independence: Independent        Comments: was keeping her grandchildren        OT Problem List: Decreased strength;Decreased activity tolerance;Impaired balance (sitting and/or standing);Decreased safety awareness;Decreased knowledge of use of DME or AE;Decreased knowledge of precautions;Pain      OT Treatment/Interventions: Self-care/ADL training;Therapeutic exercise;Energy conservation;DME and/or AE instruction;Therapeutic activities;Patient/family education    OT Goals(Current goals can be found in the care plan section) Acute Rehab OT Goals Patient Stated Goal: To return to independent OT Goal Formulation: With patient Time For Goal Achievement: 01/12/17 Potential to Achieve Goals: Good ADL Goals Pt  Will Perform Lower Body Dressing: with min guard assist;sit to/from stand;with adaptive equipment Pt Will Transfer to Toilet: with min guard assist;bedside commode;ambulating Pt Will Perform Toileting - Clothing Manipulation and hygiene: with min guard assist;sit to/from stand Pt Will Perform Tub/Shower Transfer: Tub transfer;3 in 1;rolling walker;ambulating;with min guard assist Additional ADL Goal #1: Pt will independently recall 3/3 back precautions  OT Frequency: Min 2X/week   Barriers to D/C:            Co-evaluation              AM-PAC PT "6 Clicks" Daily Activity     Outcome Measure Help from another person eating meals?: None Help from another person taking care of personal grooming?: A Little Help from another person toileting, which includes using toliet, bedpan, or urinal?: A Little Help from another person bathing (including washing, rinsing, drying)?: A Lot Help from another person to put on and taking off regular upper body clothing?: A Little Help from another person to put on and taking off regular lower body clothing?: A Lot 6 Click Score: 17   End of Session Equipment Utilized During Treatment: Rolling walker Nurse Communication: Mobility status  Activity Tolerance: Patient tolerated treatment well Patient left: in chair;with call bell/phone within reach;with family/visitor present  OT Visit Diagnosis: Unsteadiness on feet (R26.81);Other abnormalities of gait and mobility (R26.89);Pain Pain - Right/Left:  (Back) Pain - part of body:  (Back)  Time: 5427-0623 OT Time Calculation (min): 18 min Charges:  OT General Charges $OT Visit: 1 Procedure OT Evaluation $OT Eval Low Complexity: 1 Procedure G-Codes:     Sanjana Folz MSOT, OTR/L Acute Rehab Pager: [phone] Office: [place] 12/29/2016, 12:25 PM

## 2016-12-29 NOTE — Progress Notes (Signed)
Physical Therapy Treatment Patient Details Name: Christine Garrett MRN: [id number] DOB: [birth year] Today's Date: 12/29/2016    History of Present Illness Patient is a 61 y/o female admitted due to T8 lytic lesion with spinal cord compression, now s/p tumor resection and stabilization.  PMH positive for CHF, GERD, DM, neuropathy, sleep apnea and HTN.     PT Comments    Progressing well with mobility, ambulated increased distance and performed stair negotiation today.  Still constipated. Encouraged more mobility.  Follow Up Recommendations  Home health PT     Equipment Recommendations  Rolling walker with 5" wheels;3in1 (PT)    Recommendations for Other Services       Precautions / Restrictions Precautions Precautions: Fall;Back Precaution Booklet Issued: Yes (comment) Precaution Comments: Provided education and handout on back precautions and adherance durign ADLs (Pt able to recall 2/3 precautions at end of session) Restrictions Weight Bearing Restrictions: No    Mobility  Bed Mobility Overal bed mobility: Modified Independent             General bed mobility comments: increased time to perform  Transfers Overall transfer level: Needs assistance Equipment used: Rolling walker (2 wheeled) Transfers: Sit to/from Stand Sit to Stand: Min guard         General transfer comment: VCs for hand placement and upright posture  Ambulation/Gait Ambulation/Gait assistance: Supervision Ambulation Distance (Feet): 500 Feet Assistive device: Rolling walker (2 wheeled) Gait Pattern/deviations: Step-through pattern;Decreased stride length Gait velocity: decreased   General Gait Details: Continued cues for increased gait speed   Stairs Stairs: Yes   Stair Management: One rail Left;Step to pattern Number of Stairs: 3 General stair comments: VCs for sequencing  Wheelchair Mobility    Modified Rankin (Stroke Patients Only)       Balance Overall balance  assessment: Needs assistance Sitting-balance support: Feet supported Sitting balance-Leahy Scale: Fair     Standing balance support: Bilateral upper extremity supported Standing balance-Leahy Scale: Fair Standing balance comment: able to static stand without UE support                            Cognition Arousal/Alertness: Awake/alert Behavior During Therapy: WFL for tasks assessed/performed Overall Cognitive Status: Within Functional Limits for tasks assessed                                 General Comments: Decreased safety awareness with back precautions      Exercises      General Comments General comments (skin integrity, edema, etc.): spoke with patient about car transfers and continued mobility expectaions      Pertinent Vitals/Pain Pain Assessment: Faces Faces Pain Scale: Hurts little more Pain Location: surgical site and stomch Pain Descriptors / Indicators: Sore;Aching Pain Intervention(s): Monitored during session    Home Living Family/patient expects to be discharged to:: Private residence Living Arrangements: Children Available Help at Discharge: Family;Available PRN/intermittently Type of Home: House Home Access: Stairs to enter Entrance Stairs-Rails: None Home Layout: One level Home Equipment: None      Prior Function Level of Independence: Independent      Comments: was keeping her grandchildren   PT Goals (current goals can now be found in the care plan section) Acute Rehab PT Goals Patient Stated Goal: To return to independent PT Goal Formulation: With patient Time For Goal Achievement: 01/03/17 Potential to Achieve Goals: Good Progress towards PT goals: Progressing  toward goals    Frequency    Min 5X/week      PT Plan Current plan remains appropriate    Co-evaluation              AM-PAC PT "6 Clicks" Daily Activity  Outcome Measure  Difficulty turning over in bed (including adjusting bedclothes,  sheets and blankets)?: A Little Difficulty moving from lying on back to sitting on the side of the bed? : A Little Difficulty sitting down on and standing up from a chair with arms (e.g., wheelchair, bedside commode, etc,.)?: A Little Help needed moving to and from a bed to chair (including a wheelchair)?: A Little Help needed walking in hospital room?: A Little Help needed climbing 3-5 steps with a railing? : A Lot 6 Click Score: 17    End of Session Equipment Utilized During Treatment: Gait belt Activity Tolerance: Patient limited by fatigue Patient left: in chair;with call bell/phone within reach Nurse Communication: Mobility status PT Visit Diagnosis: Other abnormalities of gait and mobility (R26.89);Muscle weakness (generalized) (M62.81)     Time: 7262-0355 PT Time Calculation (min) (ACUTE ONLY): 18 min  Charges:  $Gait Training: 8-22 mins                    G Codes:       Alben Deeds, [place] DPT NCS [phone]    Duncan Dull 12/29/2016, 2:57 PM

## 2016-12-29 NOTE — Op Note (Signed)
12/26/2016  6:03 PM  PATIENT:  Christine Garrett  61 y.o. female with a newly diagnosed epidural mass which is infiltrative in the left pedicle and vertebral body. She is admitted for mass resection and spinal stabilization.  PRE-OPERATIVE DIAGNOSIS:  THORACIC SPINE TUMOR  POST-OPERATIVE DIAGNOSIS:  THORACIC SPINE TUMOR  PROCEDURE:  Procedure(s): THORACIC EIGHT TUMOR RESECTION,  THORACIC SIX- THORACIC TEN POSTERIOR SPINAL FUSION, allograft morsels Segmental pedicle screw fixation T6-T10, nuvasive relign hardware SURGEON: Surgeon(s): Ashok Pall, MD  ASSISTANTS:none  ANESTHESIA:   general  EBL:  No intake/output data recorded.  BLOOD ADMINISTERED:none  CELL SAVER GIVEN:none  COUNT:per nursing  DRAINS: none   SPECIMEN:  Source of Specimen:  T8  DICTATION: Christine Garrett was taken to the operating room, intubated, and placed under a general anesthetic without difficulty. She was positioned prone on the [place] table with all pressure points properly padded. Her back was prepped and draped in a sterile manner. I infiltrated lidocaine into the planned incision. I opened the skin with a 10 blade and carried the incision down to the thoracolumbar fascia. I used the monopolar cautery to expose the lamina of T6-T10 bilaterally.  I placed pedicle screws with fluoroscopic guidance in the same manner at each level except T8. I drilled a pilot hole, sounded the pedicle, tapped the pedicle then placed the screw with fluoro. No breaches of the pedicles were appreciated.  I then decompressed the spinal canal and resected the epidural portion of the mass. The mass was a greyish color, soft, and easily separated from the dura. I removed the lamina of T8 and a portion of T9 bilaterally. I removed more tumor laterally where it had infiltrated the left pedicle and lateral mass.  I was satisfied with the decompression and the amount of tissue sent to pathology. I achieved hemostasis, and started  the closure. I used surgifoam to aid with hemostasis. I irrigated copiously then closed. I approximated the thoracolumbar fascia with vicryl sutures. I approximated the subcutaneous, and subcuticular layers with vicryl sutures. I applied an occlusive bandage and dermabond for a sterile dressing. I did inject Marcaine in the musculature and subcutaneous tissue prior to closure.  PLAN OF CARE: Admit to inpatient   PATIENT DISPOSITION:  PACU - hemodynamically stable.   Delay start of Pharmacological VTE agent (>24hrs) due to surgical blood loss or risk of bleeding:  yes

## 2016-12-30 DIAGNOSIS — G959 Disease of spinal cord, unspecified: Secondary | ICD-10-CM

## 2016-12-30 DIAGNOSIS — Z87891 Personal history of nicotine dependence: Secondary | ICD-10-CM

## 2016-12-30 DIAGNOSIS — R200 Anesthesia of skin: Secondary | ICD-10-CM

## 2016-12-30 DIAGNOSIS — R531 Weakness: Secondary | ICD-10-CM

## 2016-12-30 LAB — CBC WITH DIFFERENTIAL/PLATELET
Basophils Absolute: 0 10*3/uL (ref 0.0–0.1)
Basophils Relative: 0 %
EOS PCT: 1 %
Eosinophils Absolute: 0.1 10*3/uL (ref 0.0–0.7)
HCT: 33.1 % — ABNORMAL LOW (ref 36.0–46.0)
Hemoglobin: 11.2 g/dL — ABNORMAL LOW (ref 12.0–15.0)
LYMPHS ABS: 2.5 10*3/uL (ref 0.7–4.0)
LYMPHS PCT: 23 %
MCH: 31.3 pg (ref 26.0–34.0)
MCHC: 33.8 g/dL (ref 30.0–36.0)
MCV: 92.5 fL (ref 78.0–100.0)
MONO ABS: 0.7 10*3/uL (ref 0.1–1.0)
MONOS PCT: 6 %
Neutro Abs: 7.6 10*3/uL (ref 1.7–7.7)
Neutrophils Relative %: 70 %
PLATELETS: 298 10*3/uL (ref 150–400)
RBC: 3.58 MIL/uL — AB (ref 3.87–5.11)
RDW: 12.3 % (ref 11.5–15.5)
WBC: 10.8 10*3/uL — AB (ref 4.0–10.5)

## 2016-12-30 LAB — GLUCOSE, CAPILLARY
GLUCOSE-CAPILLARY: 119 mg/dL — AB (ref 65–99)
Glucose-Capillary: 146 mg/dL — ABNORMAL HIGH (ref 65–99)

## 2016-12-30 LAB — COMPREHENSIVE METABOLIC PANEL
ALT: 22 U/L (ref 14–54)
AST: 21 U/L (ref 15–41)
Albumin: 2.9 g/dL — ABNORMAL LOW (ref 3.5–5.0)
Alkaline Phosphatase: 77 U/L (ref 38–126)
Anion gap: 8 (ref 5–15)
BUN: 10 mg/dL (ref 6–20)
CHLORIDE: 96 mmol/L — AB (ref 101–111)
CO2: 28 mmol/L (ref 22–32)
CREATININE: 0.96 mg/dL (ref 0.44–1.00)
Calcium: 9.1 mg/dL (ref 8.9–10.3)
Glucose, Bld: 187 mg/dL — ABNORMAL HIGH (ref 65–99)
Potassium: 3.6 mmol/L (ref 3.5–5.1)
Sodium: 132 mmol/L — ABNORMAL LOW (ref 135–145)
Total Bilirubin: 0.6 mg/dL (ref 0.3–1.2)
Total Protein: 7.4 g/dL (ref 6.5–8.1)

## 2016-12-30 LAB — LACTATE DEHYDROGENASE: LDH: 161 U/L (ref 98–192)

## 2016-12-30 MED ORDER — TIZANIDINE HCL 4 MG PO TABS: 4.0000 mg | ORAL_TABLET | Freq: Four times a day (QID) | ORAL | 0 refills | Status: DC | PRN

## 2016-12-30 MED ORDER — OXYCODONE HCL 5 MG PO TABS: 5.0000 mg | ORAL_TABLET | Freq: Four times a day (QID) | ORAL | 0 refills | Status: DC | PRN

## 2016-12-30 NOTE — Progress Notes (Signed)
Pt.'s bloodwork has been drawn and pt. Is now discharged via wheelchair.

## 2016-12-30 NOTE — Progress Notes (Signed)
Occupational Therapy Treatment Patient Details Name: Christine Garrett MRN: [id number] DOB: 07/04/1955 Today's Date: 12/30/2016    History of present illness Patient is a 61 y/o female admitted due to T8 lytic lesion with spinal cord compression, now s/p tumor resection and stabilization.  PMH positive for CHF, GERD, DM, neuropathy, sleep apnea and HTN.    OT comments  Pt progressing towards established goals. Provided education for AE during LB ADLs. Pt performing LB dressing with Min Guard A for safety. Pt performing tub transfer with Min Guard A and Min VCs for sequencing task. Pt demonstrating understanding of back precautions throughout session. Provided all education and answered pt's questions in preparation for dc possibly today. All acute OT needs met. Will sign off. Thank you.    Follow Up Recommendations  No OT follow up;Supervision/Assistance - 24 hour    Equipment Recommendations  3 in 1 bedside commode    Recommendations for Other Services PT consult    Precautions / Restrictions Precautions Precautions: Fall;Back Precaution Booklet Issued: No Precaution Comments: Reviewed back precautions. Pt able to recall 3/3 with Min VCs and adheres to back precautions during ADLs  Restrictions Weight Bearing Restrictions: No       Mobility Bed Mobility               General bed mobility comments: Pt in recliner upon arrival  Transfers Overall transfer level: Needs assistance Equipment used: Rolling walker (2 wheeled) Transfers: Sit to/from Stand Sit to Stand: Min guard         General transfer comment: VCs for hand placement and upright posture    Balance Overall balance assessment: Needs assistance Sitting-balance support: Feet supported Sitting balance-Leahy Scale: Fair     Standing balance support: Bilateral upper extremity supported Standing balance-Leahy Scale: Fair Standing balance comment: able to static stand without UE support                            ADL either performed or assessed with clinical judgement   ADL Overall ADL's : Needs assistance/impaired                     Lower Body Dressing: Sit to/from stand;Min guard;With adaptive equipment;Adhering to back precautions Lower Body Dressing Details (indicate cue type and reason): Educated pt on AE. Pt demosntrated understanding of donning LB clothes by bring ankle to knee. Adhered to back precautions during dressing.          Tub/ Shower Transfer: Tub transfer;Min guard;Cueing for sequencing;Ambulation;3 in 1;Rolling walker Tub/Shower Transfer Details (indicate cue type and reason): Provided education on tub transfer with RW and without RW. Pt demonstrating good balance and understanding of education. Pt demonstrating good awareness of back precautions during transfer.  Functional mobility during ADLs: Min guard;Rolling walker General ADL Comments: Answered all pt questions and provided education in preparation for dc later today. Pt performing ADLs and fcuntional mobility at Min guard level.      Vision       Perception     Praxis      Cognition Arousal/Alertness: Awake/alert Behavior During Therapy: WFL for tasks assessed/performed Overall Cognitive Status: Within Functional Limits for tasks assessed                                          Exercises     Shoulder  Instructions       General Comments      Pertinent Vitals/ Pain       Pain Assessment: Faces Faces Pain Scale: Hurts a little bit Pain Location: surgical site and stomch Pain Descriptors / Indicators: Sore;Aching Pain Intervention(s): Monitored during session  Home Living                                          Prior Functioning/Environment              Frequency  Min 2X/week        Progress Toward Goals  OT Goals(current goals can now be found in the care plan section)  Progress towards OT goals: Goals met/education  completed, patient discharged from OT  Acute Rehab OT Goals Patient Stated Goal: To return to independent OT Goal Formulation: With patient Time For Goal Achievement: 01/12/17 Potential to Achieve Goals: Good ADL Goals Pt Will Perform Lower Body Dressing: with min guard assist;sit to/from stand;with adaptive equipment Pt Will Transfer to Toilet: with min guard assist;bedside commode;ambulating Pt Will Perform Toileting - Clothing Manipulation and hygiene: with min guard assist;sit to/from stand Pt Will Perform Tub/Shower Transfer: Tub transfer;3 in 1;rolling walker;ambulating;with min guard assist Additional ADL Goal #1: Pt will independently recall 3/3 back precautions  Plan Discharge plan remains appropriate    Co-evaluation                 AM-PAC PT "6 Clicks" Daily Activity     Outcome Measure   Help from another person eating meals?: None Help from another person taking care of personal grooming?: A Little Help from another person toileting, which includes using toliet, bedpan, or urinal?: A Little Help from another person bathing (including washing, rinsing, drying)?: A Little Help from another person to put on and taking off regular upper body clothing?: A Little Help from another person to put on and taking off regular lower body clothing?: A Little 6 Click Score: 19    End of Session Equipment Utilized During Treatment: Rolling walker;Gait belt  OT Visit Diagnosis: Unsteadiness on feet (R26.81);Other abnormalities of gait and mobility (R26.89);Pain Pain - Right/Left:  (Back) Pain - part of body:  (Back)   Activity Tolerance Patient tolerated treatment well   Patient Left in chair;with call bell/phone within reach   Nurse Communication Mobility status;Other (comment) (Persistent cough)        Time: 8867-7373 OT Time Calculation (min): 18 min  Charges: OT General Charges $OT Visit: 1 Procedure OT Treatments $Self Care/Home Management : 8-22  mins  [place], OTR/L Acute Rehab Pager: [phone] Office: [place] 12/30/2016, 11:51 AM

## 2016-12-30 NOTE — Discharge Summary (Signed)
Physician Discharge Summary  Patient ID: Christine Garrett MRN: [id number] DOB/AGE: 07/07/1955 61 y.o.  Admit date: 12/26/2016 Discharge date: 12/30/2016  Admission Diagnoses:Thoracic spine tumor  Discharge Diagnoses:  Active Problems:   Thoracic spine tumor   Discharged Condition: good  Hospital Course: Christine Garrett was admitted and taken to the operating room where she underwent a thoracic arthrodesis from T6-10, for a T8 presumptive myeloma. She underwent a partial resection of the T8 mass, which allowed for spinal canal decompression. She had pedicle screw placement from T6-10. Post op she has voided, tolerated a regular diet, and ambulating well. Her wound is clean, dry, and without signs of infection.   Treatments: surgery: as above  Discharge Exam: Blood pressure 129/75, pulse 79, temperature 98.2 F (36.8 C), temperature source Oral, resp. rate 18, height 5\' 8"  (1.727 m), weight 96.8 kg (213 lb 6.5 oz), SpO2 100 %. General appearance: alert, cooperative, appears stated age and no distress Neurologic: Alert and oriented X 3, normal strength and tone. Normal symmetric reflexes. Normal coordination and gait  Disposition: 01-Home or Self Care THORACIC SPINE TUMOR  Allergies as of 12/30/2016      Reactions   No Known Allergies       Medication List    TAKE these medications   amoxicillin 500 MG capsule Commonly known as:  AMOXIL Take 1,000 mg by mouth 2 (two) times daily.   chlorthalidone 25 MG tablet Commonly known as:  HYGROTON Take 25 mg by mouth 2 (two) times daily.   clarithromycin 500 MG tablet Commonly known as:  BIAXIN Take 500 mg by mouth 2 (two) times daily.   dexamethasone 6 MG tablet Commonly known as:  DECADRON Take 6 mg by mouth daily.   dicyclomine 10 MG capsule Commonly known as:  BENTYL Take 1 capsule (10 mg total) by mouth 3 (three) times daily as needed for spasms.   diltiazem 300 MG 24 hr capsule Commonly known as:  CARDIZEM CD Take  300mg s by mouth daily   enalapril 20 MG tablet Commonly known as:  VASOTEC Take 20 mg by mouth 2 (two) times daily.   insulin degludec 100 UNIT/ML Sopn FlexTouch Pen Commonly known as:  TRESIBA FLEXTOUCH Inject 0.5 mLs (50 Units total) into the skin daily at 10 pm.   Insulin Pen Needle 31G X 8 MM Misc Commonly known as:  B-D ULTRAFINE III SHORT PEN 1 each by Does not apply route as directed.   metFORMIN 1000 MG tablet Commonly known as:  GLUCOPHAGE Take 1,000 mg by mouth 2 (two) times daily.   omeprazole 20 MG capsule Commonly known as:  PRILOSEC Take 20 mg by mouth 2 (two) times daily.   oxyCODONE 5 MG immediate release tablet Commonly known as:  Oxy IR/ROXICODONE Take 1-2 tablets (5-10 mg total) by mouth every 6 (six) hours as needed for breakthrough pain.   potassium chloride SA 20 MEQ tablet Commonly known as:  K-DUR,KLOR-CON Take 1 tablet (20 mEq total) by mouth daily.   tiZANidine 4 MG tablet Commonly known as:  ZANAFLEX Take 1 tablet (4 mg total) by mouth every 6 (six) hours as needed for muscle spasms.   traMADol 50 MG tablet Commonly known as:  ULTRAM Take 50 mg by mouth 2 (two) times daily as needed for moderate pain.      Follow-up Information    Ashok Pall, MD Follow up in 3 week(s).   Specialty:  Neurosurgery Why:  call the office to make an appointment  Contact information: [address] [place] [address] [phone]           Signed: Winfield Cunas 12/30/2016, 1:57 PM

## 2016-12-30 NOTE — Progress Notes (Signed)
Discharge order received. Discharge instructions given to patient who verbalized understanding. As this writer was about to roll the patient out Dr. Lebron Conners from oncology showed up. Per Dr. Lebron Conners wait for new blood work to be drawn and then discharge her after the blood work is drawn.

## 2016-12-30 NOTE — Consult Note (Signed)
Medical Oncology/Hematology Consultation Note:  Requesting Provider: Dr Ashok Pall  Assessment: 61 year old female with recent discovery of T8 destructive lesion with associated cord compression in the context of long-standing neurological deficits in bilateral lower extremities. Currently, patient is status post decompressive surgery and spinal fixation that was completed successfully on 12/26/16. Currently, primary suspicion is presence of a plasmacytoma at the site either as an isolated tissue or in the context of multiple myeloma. Our service is consulted to assist in further evaluation and management of the patient.   Review of the imaging, myeloma is certainly a possibility. We will obtain appropriate lab work today to facilitate workup and bring the patient back to our clinic to review the findings and to continue evaluation. We will determine the additional needed evaluation steps, we'll have to await final report of the pathology.  Plan: --Labs today: CBC/diff, CMP, LDH, beta-2 microglobulin, SPEP/SIFE/Quant Ig, serum free light chains --RTC in the [place] on 07/23-25/18 for review and further workup  Orders Placed This Encounter  Procedures  . For home use only DME 3 n 1    Standing Status:   Standing    Number of Occurrences:   1  . Surgical pcr screen    Standing Status:   Standing    Number of Occurrences:   1    Order Specific Question:   Patient immune status    Answer:   Normal  . DG C-Arm 61-120 Min    OR 20    Standing Status:   Standing    Number of Occurrences:   1    Order Specific Question:   Reason for exam:    Answer:   THORACIC EIGHT TUMOR RESECTION, THORACIC SIX- THORACIC TEN POSTERIOR SPINAL FUSION  . DG Thoracic Spine 2 View    OR 20    Standing Status:   Standing    Number of Occurrences:   1    Order Specific Question:   Symptom/Reason for Exam    Answer:   Elective surgery [[id number]]    Order Specific Question:   Radiology Contrast Protocol - do  NOT remove file path    Answer:   \\[email or web address]  . CBC    Standing Status:   Standing    Number of Occurrences:   1  . Hemoglobin A1c    Standing Status:   Standing    Number of Occurrences:   1  . Glucose, capillary    Standing Status:   Standing    Number of Occurrences:   1  . Glucose, capillary    Standing Status:   Standing    Number of Occurrences:   1  . Glucose, capillary    Standing Status:   Standing    Number of Occurrences:   1  . Glucose, capillary    Standing Status:   Standing    Number of Occurrences:   1  . Glucose, capillary    Standing Status:   Standing    Number of Occurrences:   1  . Glucose, capillary    Standing Status:   Standing    Number of Occurrences:   1  . Glucose, capillary    Standing Status:   Standing    Number of Occurrences:   1  . Glucose, capillary    Standing Status:   Standing    Number of Occurrences:   1  . Glucose, capillary    Standing Status:   Standing    Number of  Occurrences:   1  . Glucose, capillary    Standing Status:   Standing    Number of Occurrences:   1  . Glucose, capillary    Standing Status:   Standing    Number of Occurrences:   1  . Glucose, capillary    Standing Status:   Standing    Number of Occurrences:   1  . Glucose, capillary    Standing Status:   Standing    Number of Occurrences:   1  . Glucose, capillary    Standing Status:   Standing    Number of Occurrences:   1  . Glucose, capillary    Standing Status:   Standing    Number of Occurrences:   1  . Glucose, capillary    Standing Status:   Standing    Number of Occurrences:   1  . Glucose, capillary    Standing Status:   Standing    Number of Occurrences:   1  . Glucose, capillary    Standing Status:   Standing    Number of Occurrences:   1  . Glucose, capillary    Standing Status:   Standing    Number of Occurrences:   1  . Glucose, capillary    Standing Status:   Standing    Number of  Occurrences:   1  . Glucose, capillary    Standing Status:   Standing    Number of Occurrences:   1  . Glucose, capillary    Standing Status:   Standing    Number of Occurrences:   1  . Glucose, capillary    Standing Status:   Standing    Number of Occurrences:   1  . Glucose, capillary    Standing Status:   Standing    Number of Occurrences:   1  . Glucose, capillary    Standing Status:   Standing    Number of Occurrences:   1  . Glucose, capillary    Standing Status:   Standing    Number of Occurrences:   1  . Glucose, capillary    Standing Status:   Standing    Number of Occurrences:   1  . Glucose, capillary    Standing Status:   Standing    Number of Occurrences:   1  . Glucose, capillary    Standing Status:   Standing    Number of Occurrences:   1  . Glucose, capillary    Standing Status:   Standing    Number of Occurrences:   1  . Glucose, capillary    Standing Status:   Standing    Number of Occurrences:   1  . Glucose, capillary    Standing Status:   Standing    Number of Occurrences:   1  . Glucose, capillary    Standing Status:   Standing    Number of Occurrences:   1  . Glucose, capillary    Standing Status:   Standing    Number of Occurrences:   1  . Glucose, capillary    Standing Status:   Standing    Number of Occurrences:   1  . Glucose, capillary    Standing Status:   Standing    Number of Occurrences:   1  . CBC with Differential    Standing Status:   Standing    Number of Occurrences:   1    Order Specific Question:  Specimen collection method    Answer:   Lab=Lab collect  . Comprehensive metabolic panel    Standing Status:   Standing    Number of Occurrences:   1    Order Specific Question:   Specimen collection method    Answer:   Lab=Lab collect  . Lactate dehydrogenase    Standing Status:   Standing    Number of Occurrences:   1    Order Specific Question:   Specimen collection method    Answer:   Lab=Lab collect  . Beta 2  microglobulin, serum    Standing Status:   Standing    Number of Occurrences:   1    Order Specific Question:   Specimen collection method    Answer:   Lab=Lab collect  . Multiple myeloma panel, serum    Standing Status:   Standing    Number of Occurrences:   1    Order Specific Question:   Specimen collection method    Answer:   Lab=Lab collect  . Kappa/lambda light chains    Standing Status:   Standing    Number of Occurrences:   1    Order Specific Question:   Specimen collection method    Answer:   Lab=Lab collect  . Diet Carb Modified Fluid consistency: Thin; Room service appropriate? Yes    Standing Status:   Standing    Number of Occurrences:   1    Order Specific Question:   Diet-HS Snack?    Answer:   Nothing    Order Specific Question:   Calorie Level    Answer:   Medium 1600-2000    Order Specific Question:   Fluid consistency:    Answer:   Thin    Order Specific Question:   Room service appropriate?    Answer:   Yes  . SCD's    Standing Status:   Standing    Number of Occurrences:   1    Order Specific Question:   Laterality    Answer:   Bilateral  . Vital signs with neuro checks     Standing Status:   Standing    Number of Occurrences:   1  . Mobilize POD 0 at least 1x per shift, POD 1 at least 2x per shift.  First ambulation no later than 8am After 48 hours up ad lib    After 48 hours up ad lib    Standing Status:   Standing    Number of Occurrences:   1  . Advance diet as tolerated     RN to place diet order when pt ready to progress    Standing Status:   Standing    Number of Occurrences:   1    Order Specific Question:   Advance from    Answer:   Diet clear liquid    Order Specific Question:   Advance to    Answer:   Diet Heart healthy/carb modified  . Intake and output    Standing Status:   Standing    Number of Occurrences:   1  . May catherize if unable to void q 6 hours and prn    Standing Status:   Standing    Number of Occurrences:   20  . If  drain present - empty, recharge and record output    Standing Status:   Standing    Number of Occurrences:   1  . No brace needed    Standing  Status:   Standing    Number of Occurrences:   1  . STAT CBG when hypoglycemia is suspected. If treated, recheck every 15 minutes after each treatment until CBG >/= 70 mg/dl    Standing Status:   Standing    Number of Occurrences:   1  . Refer to Hypoglycemia Protocol Sidebar Report for treatment of CBG < 70 mg/dl    Standing Status:   Standing    Number of Occurrences:   1  . Walker rolling    For home use    Standing Status:   Standing    Number of Occurrences:   20  . Full code    Standing Status:   Standing    Number of Occurrences:   1  . Consult to care management    PT/OT/SLP DME as needed    Standing Status:   Standing    Number of Occurrences:   1    Order Specific Question:   Reason for consult:    Answer:   Home health needs    Order Specific Question:   Reason for consult:    Answer:   Equipment    Order Specific Question:   Reason for consult:    Answer:   Medication needs    Order Specific Question:   Reason for consult:    Answer:   Other (see comments)  . Consult to social work    Standing Status:   Standing    Number of Occurrences:   1    Order Specific Question:   Reason for Consult:    Answer:   Skilled nursing facility placement  . OT eval and treat    Standing Status:   Standing    Number of Occurrences:   1    Order Specific Question:   Reason for OT?    Answer:   postoperative spinal surgery, eval and treat for ADL's  . PT eval and treat    Standing Status:   Standing    Number of Occurrences:   1    Order Specific Question:   Reason for PT?    Answer:   gait training, exercises  . Incentive spirometry  every 2 hours while awake for 48 hours then QID and prn.    every 2 hours while awake for 48 hours then QID and prn.    Standing Status:   Standing    Number of Occurrences:   1  . I-STAT 4, (NA,K, GLUC,  HGB,HCT)    Standing Status:   Standing    Number of Occurrences:   1  . I-STAT 4, (NA,K, GLUC, HGB,HCT)    Standing Status:   Standing    Number of Occurrences:   1  . I-STAT 4, (NA,K, GLUC, HGB,HCT)    Standing Status:   Standing    Number of Occurrences:   1  . Type and screen [place]     Standing Status:   Standing    Number of Occurrences:   1  . ABO/Rh    Standing Status:   Standing    Number of Occurrences:   1  . Change IV to NSL     When tolerating PO's and PCA discontinued    Standing Status:   Standing    Number of Occurrences:   1  . Admit to Inpatient (patient's expected length of stay will be greater than 2 midnights or  inpatient only procedure)    Standing Status:   Standing    Number of Occurrences:   1    Order Specific Question:   Hospital Area    Answer:   [facility] [[id number]]    Order Specific Question:   Level of Care    Answer:   ICU [6]    Order Specific Question:   Diagnosis    Answer:   Thoracic spine tumor [phone]    Order Specific Question:   Admitting Physician    Answer:   Ashok Pall [[id number]]    Order Specific Question:   Attending Physician    Answer:   Ashok Pall [[id number]]    Order Specific Question:   Estimated length of stay    Answer:   5 - 7 days    Order Specific Question:   Certification:    Answer:   I certify this patient will need inpatient services for at least 2 midnights    Order Specific Question:   Bed request comments    Answer:   4n    Order Specific Question:   PT Class (Do Not Modify)    Answer:   Inpatient [101]    Order Specific Question:   PT Acc Code (Do Not Modify)    Answer:   Private [1]  . Transfer patient    Standing Status:   Standing    Number of Occurrences:   1    Order Specific Question:   Hospital Area    Answer:   [facility] [[id number]]    Order Specific Question:   Level of Care    Answer:   Med-Surg [16]    Order  Specific Question:   Unit    Answer:   [facility] [[id number]]  . Discharge patient Discharge disposition: 01-Home or Self Care; Discharge patient date: 12/30/2016    Standing Status:   Standing    Number of Occurrences:   1    Order Specific Question:   Discharge disposition    Answer:   01-Home or Self Care [1]    Order Specific Question:   Discharge patient date    Answer:   12/30/2016    All questions were answered.  . The patient knows to call the clinic with any problems, questions or concerns.  This note was electronically signed.    History of Presenting Illness Christine Garrett 61 y.o. referred for oncology consultation due to suspicion of multiple myeloma. Please see oncologic history below for details.  Patient initially presented with epigastric abdominal pain that started in Apr 2018. Patient was previously self medicating with Aleve and Naprosyn or chronic pains. She did not have any melena or hematochezia. Due to the intensity of the pain, patient underwent EGD on 11/29/16 that demonstrates presence of H. pylori-associated gastritis. At the time of presentation, family reported weight loss of 60 pounds over 3-4 month period. Previously, patient has been habitually drinking alcohol up to 6 beverages per day, quit drinking mid-May 2018. Continued to have abdominal pain and underwent imaging assessment with CT of the abdomen and pelvis on 12/16/16 did demonstrate presence of a mass lesion with spinal cord compression at level of T8.  Prior to this presentation, patient describes chronic back pain as well as chronic numbness and weakness in the lower extremities right worse than left including weakness on the right severe enough that it has resulted in difficulties walking and intermittent falls. Patient did not have any  urinary or bowel incontinence. After discovery of the spinal mass, and was referred to neurosurgery after an MRI of the thoracic spine confirmed the  findings. Patient underwent spinal surgery on 12/26/16. Presently, patient is recovering well after her surgery and is scheduled to be discharged from the hospital intermittently. Official pathology report is not available yet, but the preliminary results suggest presence of myeloma lesion.  The present time, patient reports recovering well from her recent surgery that occurred on Thursday last week. Pain is reasonably well-controlled. She denies any nausea or vomiting. Denies any fever or chills. Denies any respiratory symptoms. She continues to have numbness and weakness in the left lower extremity which was one of the presenting symptoms.   Oncological/hematological History: --Labs, 11/22/16: Ca 9.5, Cr 0.86; WBC 5.6, Hgb 12.2, Plt 270;  --EGD, 11/29/16: Gastritis positive for H. pylori. Treated with omeprazole, clarithromycin, and amoxicillin x14d   --CT A/P, 12/16/16: Left T8 lytic lesion with soft tissue extension into the spinal canal with cord compression. Fatty liver measuring 19.6cm. 0.3cm lt lower pole renal calculus, no hydronephrosis;  --MRI T-spine, 12/17/16: An enhancing mass on the left side at T8 invading the canal and compressing the spinal cord. --Surgery (Dr Christella Noa), 12/26/16: T8 tumor resection/cord decompression, T6-T10 posterior spinal fusion   Medical History: Past Medical History:  Diagnosis Date  . Arthritis    knees  . Carpal tunnel syndrome   . CHF (congestive heart failure) ([place])   . Diabetes mellitus    type 2  . GERD (gastroesophageal reflux disease)   . Heart murmur    "Little, No concerns" per Dr  Dannielle Burn pt reported.  . Hypertension    controlled using a guided approch with plasma renin activity  . Neuropathy   . Peripheral vascular disease ([place])   . Sleep apnea    Uses CPAP    Surgical History: Past Surgical History:  Procedure Laterality Date  . BIOPSY  11/28/2016   Procedure: BIOPSY;  Surgeon: Rogene Houston, MD;  [facility];   Service: Endoscopy;;  gastric  . [place]  . CERVICAL CONE BIOPSY     cervical lesion  . ESOPHAGOGASTRODUODENOSCOPY (EGD) WITH PROPOFOL N/A 11/28/2016   Procedure: ESOPHAGOGASTRODUODENOSCOPY (EGD) WITH PROPOFOL;  Surgeon: Rogene Houston, MD;  [facility];  Service: Endoscopy;  Laterality: N/A;  9:25  . lipoma removal     right shoulder 2001  . MULTIPLE EXTRACTIONS WITH ALVEOLOPLASTY Bilateral 08/24/2012   Procedure: MULTIPLE EXTRACTION WITH ALVEOLOPLASTY BIOPSY OF RIGHT AND LEFT MANDIBLE ;  Surgeon: Gae Bon, DDS;  Location: [facility];  Service: Oral Surgery;  Laterality: Bilateral;  . POSTERIOR LUMBAR FUSION 4 LEVEL N/A 12/26/2016   Procedure: THORACIC EIGHT TUMOR RESECTION, THORACIC SIX- THORACIC TEN POSTERIOR SPINAL FUSION;  Surgeon: Ashok Pall, MD;  Location: [facility];  Service: Neurosurgery;  Laterality: N/A;  THORACIC 8 TUMOR RESECTION, THORACIC 6- THORACIC 10 POSTERIOR SPINAL FUSION  . ROTATOR CUFF REPAIR     right shoulder  . TUBAL LIGATION      Family History: Family History  Problem Relation Age of Onset  . Diabetes Mother   . Hypertension Mother   . Heart disease Mother   . Diabetes Father   . Heart disease Sister   . Diabetes Sister     Social History: Social History   Social History  . Marital status: Married    Spouse name: N/A  . Number of children: N/A  . Years  of education: N/A   Occupational History  . Not on file.   Social History Main Topics  . Smoking status: Former Smoker    Packs/day: 0.50    Years: 6.00    Types: Cigarettes    Quit date: 11/23/2011  . Smokeless tobacco: Never Used     Comment: quit summer 2013  . Alcohol use No  . Drug use: No  . Sexual activity: Not Currently    Birth control/ protection: Post-menopausal   Other Topics Concern  . Not on file   Social History Narrative  . No narrative on file    Allergies: Allergies  Allergen Reactions  . No Known Allergies     Medications:  Current  Facility-Administered Medications  Medication Dose Route Frequency Provider Last Rate Last Dose  . acetaminophen (TYLENOL) tablet 650 mg  650 mg Oral Q4H PRN Ashok Pall, MD       Or  . acetaminophen (TYLENOL) suppository 650 mg  650 mg Rectal Q4H PRN Ashok Pall, MD      . bisacodyl (DULCOLAX) EC tablet 5 mg  5 mg Oral Daily PRN Ashok Pall, MD   5 mg at 12/29/16 2157  . chlorthalidone (HYGROTON) tablet 25 mg  25 mg Oral BID Ashok Pall, MD   25 mg at 12/30/16 0827  . cloNIDine (CATAPRES) tablet 0.1 mg  0.1 mg Oral Q8H PRN Ashok Pall, MD   0.1 mg at 12/26/16 2320  . diazepam (VALIUM) tablet 5 mg  5 mg Oral Q6H PRN Ashok Pall, MD      . dicyclomine (BENTYL) capsule 10 mg  10 mg Oral TID PRN Ashok Pall, MD      . diltiazem (CARDIZEM CD) 24 hr capsule 300 mg  300 mg Oral Daily Ashok Pall, MD   300 mg at 12/30/16 1022  . docusate sodium (COLACE) capsule 100 mg  100 mg Oral BID Ashok Pall, MD   100 mg at 12/30/16 1023  . enalapril (VASOTEC) tablet 20 mg  20 mg Oral BID Ashok Pall, MD   20 mg at 12/30/16 1022  . insulin aspart (novoLOG) injection 0-15 Units  0-15 Units Subcutaneous TID WC Javier Glazier, MD   2 Units at 12/30/16 1248  . insulin aspart (novoLOG) injection 0-5 Units  0-5 Units Subcutaneous QHS Javier Glazier, MD   4 Units at 12/27/16 2247  . insulin aspart (novoLOG) injection 4 Units  4 Units Subcutaneous TID WC Costella, Vista Mink, PA-C   4 Units at 12/30/16 1247  . insulin glargine (LANTUS) injection 50 Units  50 Units Subcutaneous QHS Javier Glazier, MD   50 Units at 12/29/16 2159  . menthol-cetylpyridinium (CEPACOL) lozenge 3 mg  1 lozenge Oral PRN Ashok Pall, MD       Or  . phenol (CHLORASEPTIC) mouth spray 1 spray  1 spray Mouth/Throat PRN Ashok Pall, MD      . morphine 4 MG/ML injection 2 mg  2 mg Intravenous Q2H PRN Ashok Pall, MD   2 mg at 12/27/16 1245  . ondansetron (ZOFRAN) tablet 4 mg  4 mg Oral Q6H PRN Ashok Pall, MD        Or  . ondansetron (ZOFRAN) injection 4 mg  4 mg Intravenous Q6H PRN Ashok Pall, MD   4 mg at 12/28/16 0958  . oxyCODONE (Oxy IR/ROXICODONE) immediate release tablet 5-10 mg  5-10 mg Oral Q3H PRN Ashok Pall, MD   10 mg at 12/30/16 1023  . oxyCODONE (OXYCONTIN) 12  hr tablet 15 mg  15 mg Oral Q12H Ashok Pall, MD   15 mg at 12/30/16 1023  . pantoprazole (PROTONIX) EC tablet 80 mg  80 mg Oral Daily Ashok Pall, MD   80 mg at 12/30/16 1022  . potassium chloride SA (K-DUR,KLOR-CON) CR tablet 20 mEq  20 mEq Oral Daily Ashok Pall, MD   20 mEq at 12/30/16 1022  . senna-docusate (Senokot-S) tablet 1 tablet  1 tablet Oral QHS PRN Ashok Pall, MD   1 tablet at 12/27/16 2146  . sodium phosphate (FLEET) 7-19 GM/118ML enema 1 enema  1 enema Rectal Daily PRN [name], MD   1 enema at 12/28/16 1619  . zolpidem (AMBIEN) tablet 5 mg  5 mg Oral QHS PRN Ashok Pall, MD        Review of Systems: Review of Systems  Constitutional: Negative for appetite change, fatigue, fever and unexpected weight change.  HENT:   Negative for lump/mass, mouth sores, nosebleeds, sore throat, trouble swallowing and voice change.   Respiratory: Negative for chest tightness, cough, hemoptysis, shortness of breath and wheezing.   Cardiovascular: Negative for chest pain and leg swelling.  Gastrointestinal: Positive for abdominal pain and constipation. Negative for abdominal distention, blood in stool, diarrhea, nausea, rectal pain and vomiting.  Genitourinary: Negative for bladder incontinence, dysuria and hematuria.   Musculoskeletal: Positive for back pain and gait problem.  Skin: Negative for itching, rash and wound.  Neurological: Positive for gait problem and numbness.       Patient reports decrease in sensation and paresthesias in the toes of the left lower extremity with accompanying difficulties with gait and falls.     PHYSICAL EXAMINATION Blood pressure 129/75, pulse 79, temperature 98.2 F (36.8  C), temperature source Oral, resp. rate 18, height '5\' 8"'  (1.727 m), weight 213 lb 6.5 oz (96.8 kg), SpO2 100 %.  ECOG PERFORMANCE STATUS: 2 - Symptomatic, <50% confined to bed  Physical Exam  Constitutional: She is well-developed, well-nourished, and in no distress.  HENT:  Head: Normocephalic and atraumatic.  Mouth/Throat: No oropharyngeal exudate.  Eyes: Pupils are equal, round, and reactive to light. EOM are normal. No scleral icterus.  Cardiovascular: Normal rate.   No murmur heard. Pulmonary/Chest: Effort normal and breath sounds normal. She has no wheezes.  Abdominal: Soft. She exhibits no distension and no mass. There is no tenderness.  Musculoskeletal: She exhibits no edema.  Recent surgical changes over the thoracic spine. Appropriate swelling and tenderness.  Lymphadenopathy:    She has no cervical adenopathy.  Neurological:  Decreased sensation to touch accompanied degrees in the deep tendon reflexes on the right lower extremity. Remainder of the neurological examination is intact.  Skin: Skin is warm and dry. No rash noted. She is not diaphoretic.     LABORATORY DATA: I have personally reviewed the data as listed: Admission on 12/26/2016  Component Date Value Ref Range Status  . ABO/RH(D) 12/26/2016 B POS   Final  . Antibody Screen 12/26/2016 NEG   Final  . Sample Expiration 12/26/2016 12/29/2016   Final  . WBC 12/26/2016 12.5* 4.0 - 10.5 K/uL Final  . RBC 12/26/2016 4.02  3.87 - 5.11 MIL/uL Final  . Hemoglobin 12/26/2016 12.7  12.0 - 15.0 g/dL Final  . HCT 12/26/2016 36.0  36.0 - 46.0 % Final  . MCV 12/26/2016 89.6  78.0 - 100.0 fL Final  . MCH 12/26/2016 31.6  26.0 - 34.0 pg Final  . MCHC 12/26/2016 35.3  30.0 - 36.0  g/dL Final  . RDW 12/26/2016 12.4  11.5 - 15.5 % Final  . Platelets 12/26/2016 313  150 - 400 K/uL Final  . Hgb A1c MFr Bld 12/26/2016 13.3* 4.8 - 5.6 % Final   Comment: (NOTE)         Pre-diabetes: 5.7 - 6.4         Diabetes: >6.4          Glycemic control for adults with diabetes: <7.0   . Mean Plasma Glucose 12/26/2016 335  mg/dL Final   Comment: (NOTE) Performed At: [facility] [place], [place] [id number] Lindon Romp MD [id number]   . Glucose-Capillary 12/26/2016 460* 65 - 99 mg/dL Final  . Comment 1 12/26/2016 Notify RN   Final  . Comment 2 12/26/2016 Document in Chart   Final  . ABO/RH(D) 12/26/2016 B POS   Final  . MRSA, PCR 12/26/2016 NEGATIVE  NEGATIVE Final  . Staphylococcus aureus 12/26/2016 NEGATIVE  NEGATIVE Final   Comment:        The Xpert SA Assay (FDA approved for NASAL specimens in patients over 74 years of age), is one component of a comprehensive surveillance program.  Test performance has been validated by [facility] for patients greater than or equal to 41 year old. It is not intended to diagnose infection nor to guide or monitor treatment.   . Glucose-Capillary 12/26/2016 396* 65 - 99 mg/dL Final  . Glucose-Capillary 12/26/2016 332* 65 - 99 mg/dL Final  . Comment 1 12/26/2016 Notify RN   Final  . Comment 2 12/26/2016 Document in Chart   Final  . Glucose-Capillary 12/26/2016 298* 65 - 99 mg/dL Final  . Comment 1 12/26/2016 Call MD NNP PA CNM   Final  . Glucose-Capillary 12/26/2016 135* 65 - 99 mg/dL Final  . Glucose-Capillary 12/26/2016 179* 65 - 99 mg/dL Final  . Glucose-Capillary 12/26/2016 173* 65 - 99 mg/dL Final  . Glucose-Capillary 12/26/2016 165* 65 - 99 mg/dL Final  . Glucose-Capillary 12/26/2016 184* 65 - 99 mg/dL Final  . Glucose-Capillary 12/26/2016 170* 65 - 99 mg/dL Final  . Glucose-Capillary 12/27/2016 149* 65 - 99 mg/dL Final  . Glucose-Capillary 12/27/2016 145* 65 - 99 mg/dL Final  . Glucose-Capillary 12/27/2016 103* 65 - 99 mg/dL Final  . Comment 1 12/27/2016 Notify RN   Final  . Comment 2 12/27/2016 Document in Chart   Final  . Glucose-Capillary 12/27/2016 126* 65 - 99 mg/dL Final  . Glucose-Capillary 12/27/2016 118* 65 - 99 mg/dL Final   . Glucose-Capillary 12/27/2016 114* 65 - 99 mg/dL Final  . Glucose-Capillary 12/27/2016 124* 65 - 99 mg/dL Final  . Sodium 12/26/2016 137  135 - 145 mmol/L Final  . Potassium 12/26/2016 3.8  3.5 - 5.1 mmol/L Final  . Glucose, Bld 12/26/2016 202* 65 - 99 mg/dL Final  . HCT 12/26/2016 35.0* 36.0 - 46.0 % Final  . Hemoglobin 12/26/2016 11.9* 12.0 - 15.0 g/dL Final  . Sodium 12/26/2016 137  135 - 145 mmol/L Final  . Potassium 12/26/2016 4.1  3.5 - 5.1 mmol/L Final  . Glucose, Bld 12/26/2016 170* 65 - 99 mg/dL Final  . HCT 12/26/2016 33.0* 36.0 - 46.0 % Final  . Hemoglobin 12/26/2016 11.2* 12.0 - 15.0 g/dL Final  . Sodium 12/26/2016 137  135 - 145 mmol/L Final  . Potassium 12/26/2016 4.0  3.5 - 5.1 mmol/L Final  . Glucose, Bld 12/26/2016 137* 65 - 99 mg/dL Final  . HCT 12/26/2016 31.0* 36.0 - 46.0 %  Final  . Hemoglobin 12/26/2016 10.5* 12.0 - 15.0 g/dL Final  . Glucose-Capillary 12/27/2016 167* 65 - 99 mg/dL Final  . Glucose-Capillary 12/27/2016 197* 65 - 99 mg/dL Final  . Glucose-Capillary 12/27/2016 288* 65 - 99 mg/dL Final  . Glucose-Capillary 12/27/2016 320* 65 - 99 mg/dL Final  . Glucose-Capillary 12/27/2016 314* 65 - 99 mg/dL Final  . Glucose-Capillary 12/27/2016 318* 65 - 99 mg/dL Final  . Glucose-Capillary 12/27/2016 324* 65 - 99 mg/dL Final  . Glucose-Capillary 12/27/2016 312* 65 - 99 mg/dL Final  . Glucose-Capillary 12/28/2016 175* 65 - 99 mg/dL Final  . Glucose-Capillary 12/28/2016 224* 65 - 99 mg/dL Final  . Glucose-Capillary 12/28/2016 252* 65 - 99 mg/dL Final  . Glucose-Capillary 12/28/2016 219* 65 - 99 mg/dL Final  . Glucose-Capillary 12/28/2016 173* 65 - 99 mg/dL Final  . Comment 1 12/28/2016 Notify RN   Final  . Comment 2 12/28/2016 Document in Chart   Final  . Glucose-Capillary 12/29/2016 197* 65 - 99 mg/dL Final  . Comment 1 12/29/2016 Notify RN   Final  . Comment 2 12/29/2016 Document in Chart   Final  . Glucose-Capillary 12/29/2016 211* 65 - 99 mg/dL Final  .  Comment 1 12/29/2016 Notify RN   Final  . Comment 2 12/29/2016 Document in Chart   Final  . Glucose-Capillary 12/29/2016 305* 65 - 99 mg/dL Final  . Comment 1 12/29/2016 Notify RN   Final  . Comment 2 12/29/2016 Document in Chart   Final  . Glucose-Capillary 12/29/2016 175* 65 - 99 mg/dL Final  . Comment 1 12/29/2016 Notify RN   Final  . Comment 2 12/29/2016 Document in Chart   Final  . Glucose-Capillary 12/30/2016 119* 65 - 99 mg/dL Final  . Comment 1 12/30/2016 Notify RN   Final  . Comment 2 12/30/2016 Document in Chart   Final  . Glucose-Capillary 12/30/2016 146* 65 - 99 mg/dL Final   CBC      [name], MD

## 2016-12-30 NOTE — Care Management Note (Signed)
Case Management Note  Patient Details  Name: Christine Garrett MRN: [id number] Date of Birth: [birth year]  Subjective/Objective:      CM following for progression and d/c planning.               Action/Plan: 12/30/2016 Met with pt and family , DME ordered and delivered to room . Family declines [place] services.   Expected Discharge Date:  12/30/16               Expected Discharge Plan:  Home/Self Care  In-House Referral:  NA  Discharge planning Services  CM Consult  Post Acute Care Choice:  Durable Medical Equipment Choice offered to:  Patient  DME Arranged:  3-N-1, Walker rolling DME Agency:  [place]:  NA [place] Agency:  NA  Status of Service:   Complete.  If discussed at [place] of Stay Meetings, dates discussed:    Additional Comments:  Adron Bene, RN 12/30/2016, 4:17 PM

## 2016-12-30 NOTE — Progress Notes (Signed)
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  PMH positive for CHF, GERD, DM, neuropathy, sleep apnea and HTN.     PT Comments    Progressing well, ambulating and performing stairs without assist. Good recall of precautions. No further acute PT needs will sign off.   Follow Up Recommendations  No PT follow up     Equipment Recommendations  Rolling walker with 5" wheels;3in1 (PT)    Recommendations for Other Services       Precautions / Restrictions Precautions Precautions: Fall;Back Precaution Booklet Issued: Yes (comment) Precaution Comments: Provided education and handout on back precautions and adherance durign ADLs (Pt able to recall 2/3 precautions at end of session) Restrictions Weight Bearing Restrictions: No    Mobility  Bed Mobility Overal bed mobility: Modified Independent             General bed mobility comments: increased time to perform  Transfers Overall transfer level: Modified independent Equipment used: Rolling walker (2 wheeled) Transfers: Sit to/from Stand Sit to Stand: Modified independent (Device/Increase time)         General transfer comment: no physical assist required, mobilizing well  Ambulation/Gait Ambulation/Gait assistance: Modified independent (Device/Increase time) Ambulation Distance (Feet): 510 Feet Assistive device: Rolling walker (2 wheeled) Gait Pattern/deviations: Step-through pattern;Decreased stride length Gait velocity: decreased   General Gait Details: Mobilizing well   Stairs Stairs: Yes   Stair Management: One rail Left;Step to pattern Number of Stairs: 4 General stair comments: performed without cues or assist  Wheelchair Mobility    Modified Rankin (Stroke  Patients Only)       Balance Overall balance assessment: Needs assistance Sitting-balance support: Feet supported Sitting balance-Leahy Scale: Good     Standing balance support: Bilateral upper extremity supported Standing balance-Leahy Scale: Fair Standing balance comment: able to static stand without UE support                            Cognition Arousal/Alertness: Awake/alert Behavior During Therapy: WFL for tasks assessed/performed Overall Cognitive Status: Within Functional Limits for tasks assessed                                 General Comments: Decreased safety awareness with back precautions      Exercises      General Comments        Pertinent Vitals/Pain Pain Assessment: Faces Faces Pain Scale: Hurts a little bit Pain Location: back Pain Descriptors / Indicators: Sore;Aching Pain Intervention(s): Monitored during session    Home Living                      Prior Function            PT Goals (current goals can now be found in the care plan section) Acute Rehab PT Goals Patient Stated Goal: To return to independent PT Goal Formulation: With patient Time For Goal Achievement: 01/03/17 Potential to Achieve Goals: Good Progress towards PT goals: Goals met/education completed, patient discharged from PT    Frequency    Min 5X/week      PT Plan Current plan remains appropriate    Co-evaluation  AM-PAC PT "6 Clicks" Daily Activity  Outcome Measure  Difficulty turning over in bed (including adjusting bedclothes, sheets and blankets)?: A Little Difficulty moving from lying on back to sitting on the side of the bed? : A Little Difficulty sitting down on and standing up from a chair with arms (e.g., wheelchair, bedside commode, etc,.)?: A Little Help needed moving to and from a bed to chair (including a wheelchair)?: A Little Help needed walking in hospital room?: A Little Help needed climbing  3-5 steps with a railing? : A Lot 6 Click Score: 17    End of Session Equipment Utilized During Treatment: Gait belt Activity Tolerance: Patient limited by fatigue Patient left: in bed;with call bell/phone within reach;with family/visitor present Nurse Communication: Mobility status PT Visit Diagnosis: Other abnormalities of gait and mobility (R26.89);Muscle weakness (generalized) (M62.81)     Time: 1342-1400 PT Time Calculation (min) (ACUTE ONLY): 18 min  Charges:  $Gait Training: 8-22 mins                    G Codes:       Alben Deeds, PT DPT [facility] [phone]    Duncan Dull 12/30/2016, 2:23 PM Alben Deeds, [place] DPT [facility] [phone]

## 2016-12-30 NOTE — Care Management Important Message (Signed)
Important Message  Patient Details  Name: Christine Garrett MRN: [id number] Date of Birth: 05/16/1956   Medicare Important Message Given:  Yes    Orbie Pyo 12/30/2016, 2:36 PM

## 2016-12-30 NOTE — Progress Notes (Signed)
Pt ambulated 200 ft in hallway without difficulty using front wheel walker. Pain well controlled overnight with scheduled and PRN pain med. Surgical site clean and intact, no swelling, redness, warmth. Margins attached. No BM, but on po stool softener and prn laxative. Refused enema X 3 on night shift. Bowel sounds hypoactive, abdomen tender and distended. Passing gas. Will continue to monitor.

## 2016-12-31 ENCOUNTER — Telehealth: Payer: Self-pay | Admitting: Hematology and Oncology

## 2016-12-31 ENCOUNTER — Encounter: Payer: Self-pay | Admitting: Hematology and Oncology

## 2016-12-31 NOTE — Telephone Encounter (Signed)
Pt cld to schedule a hospital follow up with Dr. Lebron Conners. Appt has been scheduled for 7/23 at 130pm. Pt agreed to the appt date and time. Letter mailed.

## 2017-01-01 LAB — BETA 2 MICROGLOBULIN, SERUM: Beta-2 Microglobulin: 2.3 mg/L (ref 0.6–2.4)

## 2017-01-01 LAB — KAPPA/LAMBDA LIGHT CHAINS
KAPPA FREE LGHT CHN: 76.6 mg/L — AB (ref 3.3–19.4)
Kappa, lambda light chain ratio: 10.35 — ABNORMAL HIGH (ref 0.26–1.65)
LAMDA FREE LIGHT CHAINS: 7.4 mg/L (ref 5.7–26.3)

## 2017-01-03 LAB — MULTIPLE MYELOMA PANEL, SERUM
ALPHA 1: 0.5 g/dL — AB (ref 0.0–0.4)
ALPHA2 GLOB SERPL ELPH-MCNC: 1.1 g/dL — AB (ref 0.4–1.0)
Albumin SerPl Elph-Mcnc: 2.8 g/dL — ABNORMAL LOW (ref 2.9–4.4)
Albumin/Glob SerPl: 0.8 (ref 0.7–1.7)
B-GLOBULIN SERPL ELPH-MCNC: 1 g/dL (ref 0.7–1.3)
GAMMA GLOB SERPL ELPH-MCNC: 1.4 g/dL (ref 0.4–1.8)
GLOBULIN, TOTAL: 3.9 g/dL (ref 2.2–3.9)
IGA: 51 mg/dL — AB (ref 87–352)
IGG (IMMUNOGLOBIN G), SERUM: 1477 mg/dL (ref 700–1600)
IgM, Serum: 17 mg/dL — ABNORMAL LOW (ref 26–217)
M PROTEIN SERPL ELPH-MCNC: 1 g/dL — AB
Total Protein ELP: 6.7 g/dL (ref 6.0–8.5)

## 2017-01-05 NOTE — Anesthesia Postprocedure Evaluation (Signed)
Anesthesia Post Note  Patient: Sharnese Heath  Procedure(s) Performed: Procedure(s) (LRB): THORACIC EIGHT TUMOR RESECTION, THORACIC SIX- THORACIC TEN POSTERIOR SPINAL FUSION (N/A)     Patient location during evaluation: PACU Anesthesia Type: General Level of consciousness: awake and alert, awake and sedated Pain management: pain level controlled Vital Signs Assessment: post-procedure vital signs reviewed and stable Respiratory status: spontaneous breathing, nonlabored ventilation, respiratory function stable and patient connected to nasal cannula oxygen Cardiovascular status: blood pressure returned to baseline and stable Postop Assessment: no signs of nausea or vomiting Anesthetic complications: no    Last Vitals:  Vitals:   12/30/16 0957 12/30/16 1340  BP: 115/64 129/75  Pulse: 79 79  Resp: 18 18  Temp: 36.9 C 36.8 C    Last Pain:  Vitals:   12/30/16 1340  TempSrc: Oral  PainSc:                  Caty Tessler,JAMES TERRILL

## 2017-01-06 ENCOUNTER — Encounter: Payer: Self-pay | Admitting: Hematology and Oncology

## 2017-01-06 ENCOUNTER — Other Ambulatory Visit: Payer: Self-pay | Admitting: Hematology and Oncology

## 2017-01-06 ENCOUNTER — Ambulatory Visit (HOSPITAL_BASED_OUTPATIENT_CLINIC_OR_DEPARTMENT_OTHER): Payer: Medicare Other | Admitting: Hematology and Oncology

## 2017-01-06 VITALS — BP 143/72 | HR 84 | Temp 98.4°F | Resp 18 | Ht 68.0 in | Wt 208.5 lb

## 2017-01-06 DIAGNOSIS — G629 Polyneuropathy, unspecified: Secondary | ICD-10-CM | POA: Insufficient documentation

## 2017-01-06 DIAGNOSIS — C9000 Multiple myeloma not having achieved remission: Secondary | ICD-10-CM

## 2017-01-06 DIAGNOSIS — C9001 Multiple myeloma in remission: Secondary | ICD-10-CM | POA: Insufficient documentation

## 2017-01-06 HISTORY — DX: Multiple myeloma not having achieved remission: C90.00

## 2017-01-06 NOTE — Assessment & Plan Note (Signed)
61 year old female who initially presented with significant weight loss in the context of H. pylori-associated gastritis and alcohol abuse. At the same time, an incidental discovery of a lower thoracic vertebral mass in the context of chronic numbness and weakness in the lower extremities. MRI of the thoracic spine demonstrated a destructive lesion at T8 level and patient underwent hematological treatment. 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 Peripheral blood contains paraproteinemia with monoclonal IgG kappa. At this time, the priority of evaluation includes demonstrating whether patient has a solitary plasmacytoma or multiple myeloma.   If the first one is the diagnosis, patient can be treated with local therapy including possible adjuvant radiation to the T8 area. If multiple myeloma is diagnosed, palliative radiation can be used to reduce amount of pain in the T8 area. Patient will likely need systemic therapy with curative-intent purpose possibly available. By age criteria, patient may be a candidate for induction followed by high intensity consolidation with autologous stem cell rescue, but her past medical history and substance abuse may be limiting in that aspect.  Plan: --PET/CT to evaluate for other locations of possible disease including possible locations of threatened fractures --Bone marrow biopsy by interventional radiology including cytogenetic analysis to risk stratify the myeloma --Based on the results of those studies, we may order placement of Infuse-a-Port by interventional radiology in anticipation of systemic therapy --If multiple myeloma diagnosis is confirmed, we will also refer patient to evaluation by one of the regional transplant centers.

## 2017-01-06 NOTE — Progress Notes (Signed)
[place] [facility] New Visit:  Assessment: Multiple myeloma not having achieved remission [facility]) 61 year old female who initially presented with significant weight loss in the context of H. pylori-associated gastritis and alcohol abuse. At the same time, an incidental discovery of a lower thoracic vertebral mass in the context of chronic numbness and weakness in the lower extremities. MRI of the thoracic spine demonstrated a destructive lesion at T8 level and patient underwent hematological treatment. 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 Peripheral blood contains paraproteinemia with monoclonal IgG kappa. At this time, the priority of evaluation includes demonstrating whether patient has a solitary plasmacytoma or multiple myeloma.   If the first one is the diagnosis, patient can be treated with local therapy including possible adjuvant radiation to the T8 area. If multiple myeloma is diagnosed, palliative radiation can be used to reduce amount of pain in the T8 area. Patient will likely need systemic therapy with curative-intent purpose possibly available. By age criteria, patient may be a candidate for induction followed by high intensity consolidation with autologous stem cell rescue, but her past medical history and substance abuse may be limiting in that aspect.  Plan: --PET/CT to evaluate for other locations of possible disease including possible locations of threatened fractures --Bone marrow biopsy by interventional radiology including cytogenetic analysis to risk stratify the myeloma --Based on the results of those studies, we may order placement of Infuse-a-Port by interventional radiology in anticipation of systemic therapy --If multiple myeloma diagnosis is confirmed, we will also refer patient to evaluation by one of the regional transplant centers.  Voice recognition software was used and creation of this note. Despite my best effort at  editing the text, some misspelling/errors may have occurred.  Orders Placed This Encounter  Procedures  . CT Biopsy    Standing Status:   Future    Standing Expiration Date:   01/06/2018    Order Specific Question:   Lab orders requested (DO NOT place separate lab orders, these will be automatically ordered during procedure specimen collection):    Answer:   Surgical Pathology    Order Specific Question:   Reason for Exam (SYMPTOM  OR DIAGNOSIS REQUIRED)    Answer:   Suspected multiple myeloma diagnosis    Order Specific Question:   Is patient pregnant?    Answer:   No    Order Specific Question:   Preferred imaging location?    Answer:   [facility]    Order Specific Question:   Radiology Contrast Protocol - do NOT remove file path    Answer:   \\[email or web address]\CTProtocols.pdf  . NM PET Image Initial (PI) Whole Body    Standing Status:   Future    Standing Expiration Date:   01/06/2018    Order Specific Question:   Reason for Exam (SYMPTOM  OR DIAGNOSIS REQUIRED)    Answer:   New diagnosis of multiple myeloma, please eval for disease extent    Order Specific Question:   If indicated for the ordered procedure, I authorize the administration of a radiopharmaceutical per Radiology protocol    Answer:   Yes    Order Specific Question:   Is the patient pregnant?    Answer:   No    Order Specific Question:   Preferred imaging location?    Answer:   [facility]    Order Specific Question:   Radiology Contrast Protocol - do NOT remove file path    Answer:   \\[email or web address]\[email or web address].pdf  .  Ambulatory referral to Interventional Radiology    Referral Priority:   Routine    Referral Type:   Consultation    Referral Reason:   Specialty Services Required    Requested Specialty:   Interventional Radiology    Number of Visits Requested:   1    All questions were answered.  . The patient knows to call the clinic with any problems, questions or  concerns.  This note was electronically signed.   History of Presenting Illness Christine Garrett 61 y.o.  previously seen while hospitalized following a back surgery for oncology consultation due to suspicion of multiple myeloma. Please see oncologic history below for details.  Patient initially presented with epigastric abdominal pain that started in Apr 2018. Patient was previously self medicating with Aleve and Naprosyn or chronic pains. She did not have any melena or hematochezia. Due to the intensity of the pain, patient underwent EGD on 11/29/16 that demonstrates presence of H. pylori-associated gastritis. At the time of presentation, family reported weight loss of 60 pounds over 3-4 month period. Previously, patient has been habitually drinking alcohol up to 6 beverages per day, quit drinking mid-May 2018. Continued to have abdominal pain and underwent imaging assessment with CT of the abdomen and pelvis on 12/16/16 did demonstrate presence of a mass lesion with spinal cord compression at level of T8.  Prior to this presentation, patient describes chronic back pain as well as chronic numbness and weakness in the lower extremities right worse than left including weakness on the right severe enough that it has resulted in difficulties walking and intermittent falls. Patient did not have any urinary or bowel incontinence. After discovery of the spinal mass, and was referred to neurosurgery after an MRI of the thoracic spine confirmed the findings. Patient underwent spinal surgery on 12/26/16. Presently, patient is recovering well after her surgery and is scheduled to be discharged from the hospital intermittently. Official pathology report is not available yet, but the preliminary results suggest presence of myeloma lesion.  The present time, patient reports recovering well from her recent surgery. Pain is reasonably well-controlled. She denies any nausea or vomiting. Denies any fever or chills.  Denies any respiratory symptoms. She continues to have numbness and weakness in the left lower extremity which was one of the presenting symptoms.   Oncological/hematological History: --Labs, 11/22/16: Ca 9.5, Cr 0.86; WBC 5.6, Hgb 12.2, Plt 270;  --EGD, 11/29/16: Gastritis positive for H. pylori. Treated with omeprazole, clarithromycin, and amoxicillin x14d   --CT A/P, 12/16/16: Left T8 lytic lesion with soft tissue extension into the spinal canal with cord compression. Fatty liver measuring 19.6cm. 0.3cm lt lower pole renal calculus, no hydronephrosis;  --MRI T-spine, 12/17/16: An enhancing mass on the left side at T8 invading the canal and compressing the spinal cord. --Surgery (Dr Christella Noa), 12/26/16: T8 tumor resection/cord decompression, T6-T10 posterior spinal fusion; Pathology -- plasma cell neoplasm involvement, IHC -- positive for CD138, CD79a, CD56, LCA, CD43, kappa light chains & negative for CD20, lambda light chains; --Labs, 12/30/16: tProt 7.4, Alb 2.9, Ca 9.1, Cr 0.96, AP 77; SPEP/SIFE -- M-Spike 1.0g/dL, monoclonal IgG kappa present; IgA 51, IgG 1477, IgM 17; kappa 77.6, lambda 7.4, KLR 10.35; LDH 161, beta-2 microglobulin 2.3; WBC 10.8, Hgb 11.2, Plt 298;     Medical History: Past Medical History:  Diagnosis Date  . Arthritis    knees  . Carpal tunnel syndrome   . CHF (congestive heart failure) ([place])   . Diabetes mellitus    type  2  . GERD (gastroesophageal reflux disease)   . Heart murmur    "Little, No concerns" per Dr  Dannielle Burn pt reported.  . Hypertension    controlled using a guided approch with plasma renin activity  . Neuropathy   . Peripheral vascular disease ([place])   . Sleep apnea    Uses CPAP    Surgical History: Past Surgical History:  Procedure Laterality Date  . BIOPSY  11/28/2016   Procedure: BIOPSY;  Surgeon: Rogene Houston, MD;  [facility];  Service: Endoscopy;;  gastric  . [place]  . CERVICAL CONE BIOPSY     cervical lesion  .  ESOPHAGOGASTRODUODENOSCOPY (EGD) WITH PROPOFOL N/A 11/28/2016   Procedure: ESOPHAGOGASTRODUODENOSCOPY (EGD) WITH PROPOFOL;  Surgeon: Rogene Houston, MD;  [facility];  Service: Endoscopy;  Laterality: N/A;  9:25  . lipoma removal     right shoulder 2001  . MULTIPLE EXTRACTIONS WITH ALVEOLOPLASTY Bilateral 08/24/2012   Procedure: MULTIPLE EXTRACTION WITH ALVEOLOPLASTY BIOPSY OF RIGHT AND LEFT MANDIBLE ;  Surgeon: Gae Bon, DDS;  Location: [place];  Service: Oral Surgery;  Laterality: Bilateral;  . POSTERIOR LUMBAR FUSION 4 LEVEL N/A 12/26/2016   Procedure: THORACIC EIGHT TUMOR RESECTION, THORACIC SIX- THORACIC TEN POSTERIOR SPINAL FUSION;  Surgeon: Ashok Pall, MD;  Location: [place];  Service: Neurosurgery;  Laterality: N/A;  THORACIC 8 TUMOR RESECTION, THORACIC 6- THORACIC 10 POSTERIOR SPINAL FUSION  . ROTATOR CUFF REPAIR     right shoulder  . TUBAL LIGATION      Family History: Family History  Problem Relation Age of Onset  . Diabetes Mother   . Hypertension Mother   . Heart disease Mother   . Diabetes Father   . Heart disease Sister   . Diabetes Sister     Social History: Social History   Social History  . Marital status: Married    Spouse name: N/A  . Number of children: N/A  . Years of education: N/A   Occupational History  . Not on file.   Social History Main Topics  . Smoking status: Former Smoker    Packs/day: 0.50    Years: 6.00    Types: Cigarettes    Quit date: 11/23/2011  . Smokeless tobacco: Never Used     Comment: quit summer 2013  . Alcohol use No  . Drug use: No  . Sexual activity: Not Currently    Birth control/ protection: Post-menopausal   Other Topics Concern  . Not on file   Social History Narrative  . No narrative on file    Allergies: Allergies  Allergen Reactions  . No Known Allergies     Medications:  Current Outpatient Prescriptions  Medication Sig Dispense Refill  . amoxicillin (AMOXIL) 500 MG capsule Take 1,000  mg by mouth 2 (two) times daily.  0  . chlorthalidone (HYGROTON) 25 MG tablet Take 25 mg by mouth 2 (two) times daily.   11  . clarithromycin (BIAXIN) 500 MG tablet Take 500 mg by mouth 2 (two) times daily.  0  . dexamethasone (DECADRON) 6 MG tablet Take 6 mg by mouth daily.   0  . dicyclomine (BENTYL) 10 MG capsule Take 1 capsule (10 mg total) by mouth 3 (three) times daily as needed for spasms. 90 capsule 5  . diltiazem (CARDIZEM CD) 300 MG 24 hr capsule Take '300mg'$ s by mouth daily  3  . enalapril (VASOTEC) 20 MG tablet Take 20 mg by mouth  2 (two) times daily.     . insulin degludec (TRESIBA FLEXTOUCH) 100 UNIT/ML SOPN FlexTouch Pen Inject 0.5 mLs (50 Units total) into the skin daily at 10 pm. 5 pen 2  . Insulin Pen Needle (B-D ULTRAFINE III SHORT PEN) 31G X 8 MM MISC 1 each by Does not apply route as directed. 100 each 3  . metFORMIN (GLUCOPHAGE) 1000 MG tablet Take 1,000 mg by mouth 2 (two) times daily.     Marland Kitchen omeprazole (PRILOSEC) 20 MG capsule Take 20 mg by mouth 2 (two) times daily.    Marland Kitchen oxyCODONE (OXY IR/ROXICODONE) 5 MG immediate release tablet Take 1-2 tablets (5-10 mg total) by mouth every 6 (six) hours as needed for breakthrough pain. 50 tablet 0  . potassium chloride SA (K-DUR,KLOR-CON) 20 MEQ tablet Take 1 tablet (20 mEq total) by mouth daily. 30 tablet 2  . tiZANidine (ZANAFLEX) 4 MG tablet Take 1 tablet (4 mg total) by mouth every 6 (six) hours as needed for muscle spasms. 60 tablet 0  . traMADol (ULTRAM) 50 MG tablet Take 50 mg by mouth 2 (two) times daily as needed for moderate pain.     No current facility-administered medications for this visit.     Review of Systems: Review of Systems  Musculoskeletal: Positive for back pain and gait problem.  Neurological: Positive for extremity weakness, gait problem and numbness.  All other systems reviewed and are negative.    PHYSICAL EXAMINATION Blood pressure (!) 143/72, pulse 84, temperature 98.4 F (36.9 C), temperature source  Oral, resp. rate 18, height _0  (1.727 m), weight 208 lb 8 oz (94.6 kg), SpO2 100 %.  ECOG PERFORMANCE STATUS: 2 - Symptomatic, <50% confined to bed  Physical Exam  Constitutional: She is oriented to person, place, and time and well-developed, well-nourished, and in no distress.  HENT:  Head: Normocephalic and atraumatic.  Mouth/Throat: Oropharynx is clear and moist. No oropharyngeal exudate.  Eyes: Pupils are equal, round, and reactive to light. EOM are normal. No scleral icterus.  Neck: No thyromegaly present.  Cardiovascular: Normal rate, regular rhythm and normal heart sounds.  Exam reveals no gallop.   No murmur heard. Pulmonary/Chest: Effort normal and breath sounds normal. She has no wheezes.  Abdominal: Soft. Bowel sounds are normal. She exhibits no distension and no mass. There is no tenderness. There is no rebound.  Musculoskeletal: Normal range of motion. She exhibits no edema.  Patient has significant tenderness to palpation over the mid to lower thoracic spine consistent with recent surgery there. No other significant tenderness on palpation of the skeletal prominences.  Neurological: She is alert and oriented to person, place, and time.  Patient ambulates with difficulty and uses a walker.     LABORATORY DATA: I have personally reviewed the data as listed: No visits with results within 1 Week(s) from this visit.  Latest known visit with results is:  Admission on 12/26/2016, Discharged on 12/30/2016  Component Date Value Ref Range Status  . ABO/RH(D) 12/26/2016 B POS   Final  . Antibody Screen 12/26/2016 NEG   Final  . Sample Expiration 12/26/2016 12/29/2016   Final  . WBC 12/26/2016 12.5* 4.0 - 10.5 K/uL Final  . RBC 12/26/2016 4.02  3.87 - 5.11 MIL/uL Final  . Hemoglobin 12/26/2016 12.7  12.0 - 15.0 g/dL Final  . HCT 12/26/2016 36.0  36.0 - 46.0 % Final  . MCV 12/26/2016 89.6  78.0 - 100.0 fL Final  . MCH 12/26/2016 31.6  26.0 - 34.0  pg Final  . MCHC 12/26/2016  35.3  30.0 - 36.0 g/dL Final  . RDW 12/26/2016 12.4  11.5 - 15.5 % Final  . Platelets 12/26/2016 313  150 - 400 K/uL Final  . Hgb A1c MFr Bld 12/26/2016 13.3* 4.8 - 5.6 % Final   Comment: (NOTE)         Pre-diabetes: 5.7 - 6.4         Diabetes: >6.4         Glycemic control for adults with diabetes: <7.0   . Mean Plasma Glucose 12/26/2016 335  mg/dL Final   Comment: (NOTE) Performed At: [facility] [place], [place] [id number] Lindon Romp MD [id number]   . Glucose-Capillary 12/26/2016 460* 65 - 99 mg/dL Final  . Comment 1 12/26/2016 Notify RN   Final  . Comment 2 12/26/2016 Document in Chart   Final  . ABO/RH(D) 12/26/2016 B POS   Final  . MRSA, PCR 12/26/2016 NEGATIVE  NEGATIVE Final  . Staphylococcus aureus 12/26/2016 NEGATIVE  NEGATIVE Final   Comment:        The Xpert SA Assay (FDA approved for NASAL specimens in patients over 34 years of age), is one component of a comprehensive surveillance program.  Test performance has been validated by [facility] for patients greater than or equal to 78 year old. It is not intended to diagnose infection nor to guide or monitor treatment.   . Glucose-Capillary 12/26/2016 396* 65 - 99 mg/dL Final  . Glucose-Capillary 12/26/2016 332* 65 - 99 mg/dL Final  . Comment 1 12/26/2016 Notify RN   Final  . Comment 2 12/26/2016 Document in Chart   Final  . Glucose-Capillary 12/26/2016 298* 65 - 99 mg/dL Final  . Comment 1 12/26/2016 Call MD NNP PA CNM   Final  . Glucose-Capillary 12/26/2016 135* 65 - 99 mg/dL Final  . Glucose-Capillary 12/26/2016 179* 65 - 99 mg/dL Final  . Glucose-Capillary 12/26/2016 173* 65 - 99 mg/dL Final  . Glucose-Capillary 12/26/2016 165* 65 - 99 mg/dL Final  . Glucose-Capillary 12/26/2016 184* 65 - 99 mg/dL Final  . Glucose-Capillary 12/26/2016 170* 65 - 99 mg/dL Final  . Glucose-Capillary 12/27/2016 149* 65 - 99 mg/dL Final  . Glucose-Capillary 12/27/2016 145* 65 - 99 mg/dL Final   . Glucose-Capillary 12/27/2016 103* 65 - 99 mg/dL Final  . Comment 1 12/27/2016 Notify RN   Final  . Comment 2 12/27/2016 Document in Chart   Final  . Glucose-Capillary 12/27/2016 126* 65 - 99 mg/dL Final  . Glucose-Capillary 12/27/2016 118* 65 - 99 mg/dL Final  . Glucose-Capillary 12/27/2016 114* 65 - 99 mg/dL Final  . Glucose-Capillary 12/27/2016 124* 65 - 99 mg/dL Final  . Sodium 12/26/2016 137  135 - 145 mmol/L Final  . Potassium 12/26/2016 3.8  3.5 - 5.1 mmol/L Final  . Glucose, Bld 12/26/2016 202* 65 - 99 mg/dL Final  . HCT 12/26/2016 35.0* 36.0 - 46.0 % Final  . Hemoglobin 12/26/2016 11.9* 12.0 - 15.0 g/dL Final  . Sodium 12/26/2016 137  135 - 145 mmol/L Final  . Potassium 12/26/2016 4.1  3.5 - 5.1 mmol/L Final  . Glucose, Bld 12/26/2016 170* 65 - 99 mg/dL Final  . HCT 12/26/2016 33.0* 36.0 - 46.0 % Final  . Hemoglobin 12/26/2016 11.2* 12.0 - 15.0 g/dL Final  . Sodium 12/26/2016 137  135 - 145 mmol/L Final  . Potassium 12/26/2016 4.0  3.5 - 5.1 mmol/L Final  . Glucose, Bld 12/26/2016 137* 65 - 99  mg/dL Final  . HCT 12/26/2016 31.0* 36.0 - 46.0 % Final  . Hemoglobin 12/26/2016 10.5* 12.0 - 15.0 g/dL Final  . Glucose-Capillary 12/27/2016 167* 65 - 99 mg/dL Final  . Glucose-Capillary 12/27/2016 197* 65 - 99 mg/dL Final  . Glucose-Capillary 12/27/2016 288* 65 - 99 mg/dL Final  . Glucose-Capillary 12/27/2016 320* 65 - 99 mg/dL Final  . Glucose-Capillary 12/27/2016 314* 65 - 99 mg/dL Final  . Glucose-Capillary 12/27/2016 318* 65 - 99 mg/dL Final  . Glucose-Capillary 12/27/2016 324* 65 - 99 mg/dL Final  . Glucose-Capillary 12/27/2016 312* 65 - 99 mg/dL Final  . Glucose-Capillary 12/28/2016 175* 65 - 99 mg/dL Final  . Glucose-Capillary 12/28/2016 224* 65 - 99 mg/dL Final  . Glucose-Capillary 12/28/2016 252* 65 - 99 mg/dL Final  . Glucose-Capillary 12/28/2016 219* 65 - 99 mg/dL Final  . Glucose-Capillary 12/28/2016 173* 65 - 99 mg/dL Final  . Comment 1 12/28/2016 Notify RN   Final   . Comment 2 12/28/2016 Document in Chart   Final  . Glucose-Capillary 12/29/2016 197* 65 - 99 mg/dL Final  . Comment 1 12/29/2016 Notify RN   Final  . Comment 2 12/29/2016 Document in Chart   Final  . Glucose-Capillary 12/29/2016 211* 65 - 99 mg/dL Final  . Comment 1 12/29/2016 Notify RN   Final  . Comment 2 12/29/2016 Document in Chart   Final  . Glucose-Capillary 12/29/2016 305* 65 - 99 mg/dL Final  . Comment 1 12/29/2016 Notify RN   Final  . Comment 2 12/29/2016 Document in Chart   Final  . Glucose-Capillary 12/29/2016 175* 65 - 99 mg/dL Final  . Comment 1 12/29/2016 Notify RN   Final  . Comment 2 12/29/2016 Document in Chart   Final  . Glucose-Capillary 12/30/2016 119* 65 - 99 mg/dL Final  . Comment 1 12/30/2016 Notify RN   Final  . Comment 2 12/30/2016 Document in Chart   Final  . Glucose-Capillary 12/30/2016 146* 65 - 99 mg/dL Final  . WBC 12/30/2016 10.8* 4.0 - 10.5 K/uL Final  . RBC 12/30/2016 3.58* 3.87 - 5.11 MIL/uL Final  . Hemoglobin 12/30/2016 11.2* 12.0 - 15.0 g/dL Final  . HCT 12/30/2016 33.1* 36.0 - 46.0 % Final  . MCV 12/30/2016 92.5  78.0 - 100.0 fL Final  . MCH 12/30/2016 31.3  26.0 - 34.0 pg Final  . MCHC 12/30/2016 33.8  30.0 - 36.0 g/dL Final  . RDW 12/30/2016 12.3  11.5 - 15.5 % Final  . Platelets 12/30/2016 298  150 - 400 K/uL Final  . Neutrophils Relative % 12/30/2016 70  % Final  . Neutro Abs 12/30/2016 7.6  1.7 - 7.7 K/uL Final  . Lymphocytes Relative 12/30/2016 23  % Final  . Lymphs Abs 12/30/2016 2.5  0.7 - 4.0 K/uL Final  . Monocytes Relative 12/30/2016 6  % Final  . Monocytes Absolute 12/30/2016 0.7  0.1 - 1.0 K/uL Final  . Eosinophils Relative 12/30/2016 1  % Final  . Eosinophils Absolute 12/30/2016 0.1  0.0 - 0.7 K/uL Final  . Basophils Relative 12/30/2016 0  % Final  . Basophils Absolute 12/30/2016 0.0  0.0 - 0.1 K/uL Final  . Sodium 12/30/2016 132* 135 - 145 mmol/L Final  . Potassium 12/30/2016 3.6  3.5 - 5.1 mmol/L Final  . Chloride  12/30/2016 96* 101 - 111 mmol/L Final  . CO2 12/30/2016 28  22 - 32 mmol/L Final  . Glucose, Bld 12/30/2016 187* 65 - 99 mg/dL Final  . BUN 12/30/2016 10  6 - 20 mg/dL Final  . Creatinine, Ser 12/30/2016 0.96  0.44 - 1.00 mg/dL Final  . Calcium 12/30/2016 9.1  8.9 - 10.3 mg/dL Final  . Total Protein 12/30/2016 7.4  6.5 - 8.1 g/dL Final  . Albumin 12/30/2016 2.9* 3.5 - 5.0 g/dL Final  . AST 12/30/2016 21  15 - 41 U/L Final  . ALT 12/30/2016 22  14 - 54 U/L Final  . Alkaline Phosphatase 12/30/2016 77  38 - 126 U/L Final  . Total Bilirubin 12/30/2016 0.6  0.3 - 1.2 mg/dL Final  . GFR calc non Af Amer 12/30/2016 >60  >60 mL/min Final  . GFR calc Af Amer 12/30/2016 >60  >60 mL/min Final   Comment: (NOTE) The eGFR has been calculated using the CKD EPI equation. This calculation has not been validated in all clinical situations. eGFR's persistently <60 mL/min signify possible Chronic Kidney Disease.   . Anion gap 12/30/2016 8  5 - 15 Final  . LDH 12/30/2016 161  98 - 192 U/L Final  . Beta-2 Microglobulin 12/30/2016 2.3  0.6 - 2.4 mg/L Final   Comment: (NOTE) Siemens Immulite 2000 Immunochemiluminometric assay [facility]) Performed At: [facility] [place], [place] [id number] Lindon Romp MD [id number]   . IgG (Immunoglobin G), Serum 12/30/2016 1477  700 - 1,600 mg/dL Final  . IgA 12/30/2016 51* 87 - 352 mg/dL Final   Result confirmed on concentration.  . IgM, Serum 12/30/2016 17* 26 - 217 mg/dL Final   Result confirmed on concentration.  . Total Protein ELP 12/30/2016 6.7  6.0 - 8.5 g/dL Corrected  . Albumin SerPl Elph-Mcnc 12/30/2016 2.8* 2.9 - 4.4 g/dL Corrected  . Alpha 1 12/30/2016 0.5* 0.0 - 0.4 g/dL Corrected  . Alpha2 Glob SerPl Elph-Mcnc 12/30/2016 1.1* 0.4 - 1.0 g/dL Corrected  . B-Globulin SerPl Elph-Mcnc 12/30/2016 1.0  0.7 - 1.3 g/dL Corrected  . Gamma Glob SerPl Elph-Mcnc 12/30/2016 1.4  0.4 - 1.8 g/dL Corrected  . M Protein SerPl Elph-Mcnc  12/30/2016 1.0* Not Observed g/dL Corrected  . Globulin, Total 12/30/2016 3.9  2.2 - 3.9 g/dL Corrected  . Albumin/Glob SerPl 12/30/2016 0.8  0.7 - 1.7 Corrected  . IFE 1 12/30/2016 Comment   Corrected   Comment: (NOTE) Immunofixation shows IgG monoclonal protein with kappa light chain specificity.   . Please Note 12/30/2016 Comment   Corrected   Comment: (NOTE) Protein electrophoresis scan will follow via computer, mail, or courier delivery. Performed At: [facility] [place], [place] [id number] Lindon Romp MD [id number]   . Kappa free light chain 12/30/2016 76.6* 3.3 - 19.4 mg/L Final  . Lamda free light chains 12/30/2016 7.4  5.7 - 26.3 mg/L Final  . Kappa, lamda light chain ratio 12/30/2016 10.35* 0.26 - 1.65 Final   Comment: (NOTE) Performed At: [facility] [place], [place] [id number] Lindon Romp MD [id number]     [name], MD

## 2017-01-07 ENCOUNTER — Other Ambulatory Visit: Payer: Self-pay | Admitting: Hematology and Oncology

## 2017-01-07 NOTE — Addendum Note (Signed)
Addended by: [name] on: 01/07/2017 09:59 AM   Modules accepted: Orders

## 2017-01-14 DIAGNOSIS — D492 Neoplasm of unspecified behavior of bone, soft tissue, and skin: Secondary | ICD-10-CM | POA: Diagnosis not present

## 2017-01-14 DIAGNOSIS — R2241 Localized swelling, mass and lump, right lower limb: Secondary | ICD-10-CM | POA: Diagnosis not present

## 2017-01-16 ENCOUNTER — Other Ambulatory Visit: Payer: Self-pay | Admitting: Radiology

## 2017-01-16 ENCOUNTER — Other Ambulatory Visit: Payer: Self-pay | Admitting: General Surgery

## 2017-01-16 ENCOUNTER — Ambulatory Visit (HOSPITAL_COMMUNITY)
Admission: RE | Admit: 2017-01-16 | Discharge: 2017-01-16 | Disposition: A | Discharge status: Home / Self Care | Payer: Medicare Other | Source: Ambulatory Visit | Attending: Hematology and Oncology | Admitting: Hematology and Oncology

## 2017-01-16 DIAGNOSIS — C9000 Multiple myeloma not having achieved remission: Secondary | ICD-10-CM | POA: Diagnosis not present

## 2017-01-16 LAB — GLUCOSE, CAPILLARY: GLUCOSE-CAPILLARY: 149 mg/dL — AB (ref 65–99)

## 2017-01-16 MED ORDER — FLUDEOXYGLUCOSE F - 18 (FDG) INJECTION
10.2000 | Freq: Once | INTRAVENOUS | Status: AC | PRN
  Administered 2017-01-16: 10.2 via INTRAVENOUS

## 2017-01-17 ENCOUNTER — Ambulatory Visit (HOSPITAL_COMMUNITY)
Admission: RE | Admit: 2017-01-17 | Discharge: 2017-01-17 | Disposition: A | Discharge status: Home / Self Care | Payer: Medicare Other | Source: Ambulatory Visit | Attending: Hematology and Oncology | Admitting: Hematology and Oncology

## 2017-01-17 ENCOUNTER — Encounter (HOSPITAL_COMMUNITY): Payer: Self-pay

## 2017-01-17 ENCOUNTER — Ambulatory Visit (HOSPITAL_COMMUNITY): Admission: RE | Admit: 2017-01-17 | Payer: Medicare Other | Source: Ambulatory Visit

## 2017-01-17 DIAGNOSIS — I509 Heart failure, unspecified: Secondary | ICD-10-CM | POA: Insufficient documentation

## 2017-01-17 DIAGNOSIS — Z79899 Other long term (current) drug therapy: Secondary | ICD-10-CM | POA: Diagnosis not present

## 2017-01-17 DIAGNOSIS — G4730 Sleep apnea, unspecified: Secondary | ICD-10-CM | POA: Diagnosis not present

## 2017-01-17 DIAGNOSIS — C9000 Multiple myeloma not having achieved remission: Secondary | ICD-10-CM | POA: Insufficient documentation

## 2017-01-17 DIAGNOSIS — R7989 Other specified abnormal findings of blood chemistry: Secondary | ICD-10-CM | POA: Insufficient documentation

## 2017-01-17 DIAGNOSIS — K219 Gastro-esophageal reflux disease without esophagitis: Secondary | ICD-10-CM | POA: Diagnosis not present

## 2017-01-17 DIAGNOSIS — G5600 Carpal tunnel syndrome, unspecified upper limb: Secondary | ICD-10-CM | POA: Insufficient documentation

## 2017-01-17 DIAGNOSIS — E1151 Type 2 diabetes mellitus with diabetic peripheral angiopathy without gangrene: Secondary | ICD-10-CM | POA: Diagnosis not present

## 2017-01-17 DIAGNOSIS — Z794 Long term (current) use of insulin: Secondary | ICD-10-CM | POA: Diagnosis not present

## 2017-01-17 DIAGNOSIS — Z9889 Other specified postprocedural states: Secondary | ICD-10-CM | POA: Diagnosis not present

## 2017-01-17 DIAGNOSIS — I110 Hypertensive heart disease with heart failure: Secondary | ICD-10-CM | POA: Insufficient documentation

## 2017-01-17 DIAGNOSIS — Z833 Family history of diabetes mellitus: Secondary | ICD-10-CM | POA: Insufficient documentation

## 2017-01-17 DIAGNOSIS — M549 Dorsalgia, unspecified: Secondary | ICD-10-CM | POA: Insufficient documentation

## 2017-01-17 DIAGNOSIS — D649 Anemia, unspecified: Secondary | ICD-10-CM | POA: Insufficient documentation

## 2017-01-17 DIAGNOSIS — Z8249 Family history of ischemic heart disease and other diseases of the circulatory system: Secondary | ICD-10-CM | POA: Insufficient documentation

## 2017-01-17 DIAGNOSIS — D4989 Neoplasm of unspecified behavior of other specified sites: Secondary | ICD-10-CM | POA: Diagnosis not present

## 2017-01-17 DIAGNOSIS — Z87891 Personal history of nicotine dependence: Secondary | ICD-10-CM | POA: Insufficient documentation

## 2017-01-17 LAB — CBC WITH DIFFERENTIAL/PLATELET
BASOS ABS: 0 10*3/uL (ref 0.0–0.1)
Basophils Relative: 0 %
Eosinophils Absolute: 0.1 10*3/uL (ref 0.0–0.7)
Eosinophils Relative: 2 %
HEMATOCRIT: 32.9 % — AB (ref 36.0–46.0)
Hemoglobin: 11.5 g/dL — ABNORMAL LOW (ref 12.0–15.0)
LYMPHS PCT: 36 %
Lymphs Abs: 2.1 10*3/uL (ref 0.7–4.0)
MCH: 31.3 pg (ref 26.0–34.0)
MCHC: 35 g/dL (ref 30.0–36.0)
MCV: 89.6 fL (ref 78.0–100.0)
MONO ABS: 0.3 10*3/uL (ref 0.1–1.0)
Monocytes Relative: 5 %
NEUTROS ABS: 3.3 10*3/uL (ref 1.7–7.7)
Neutrophils Relative %: 57 %
Platelets: 419 10*3/uL — ABNORMAL HIGH (ref 150–400)
RBC: 3.67 MIL/uL — AB (ref 3.87–5.11)
RDW: 12.8 % (ref 11.5–15.5)
WBC: 5.7 10*3/uL (ref 4.0–10.5)

## 2017-01-17 LAB — BASIC METABOLIC PANEL
ANION GAP: 11 (ref 5–15)
BUN: 12 mg/dL (ref 6–20)
CHLORIDE: 103 mmol/L (ref 101–111)
CO2: 27 mmol/L (ref 22–32)
Calcium: 9.9 mg/dL (ref 8.9–10.3)
Creatinine, Ser: 0.74 mg/dL (ref 0.44–1.00)
GFR calc Af Amer: >60 mL/min (ref >60)
GLUCOSE: 152 mg/dL — AB (ref 65–99)
POTASSIUM: 3.6 mmol/L (ref 3.5–5.1)
Sodium: 141 mmol/L (ref 135–145)

## 2017-01-17 LAB — PROTIME-INR
INR: 0.99
Prothrombin Time: 13.1 seconds (ref 11.4–15.2)

## 2017-01-17 LAB — GLUCOSE, CAPILLARY: GLUCOSE-CAPILLARY: 154 mg/dL — AB (ref 65–99)

## 2017-01-17 MED ORDER — LIDOCAINE HCL (PF) 1 % IJ SOLN
INTRAMUSCULAR | Status: AC | PRN
  Administered 2017-01-17: 10 mL via INTRADERMAL

## 2017-01-17 MED ORDER — FENTANYL CITRATE (PF) 100 MCG/2ML IJ SOLN
INTRAMUSCULAR | Status: AC | PRN
  Administered 2017-01-17 (×2): 50 ug via INTRAVENOUS

## 2017-01-17 MED ORDER — MIDAZOLAM HCL 2 MG/2ML IJ SOLN
INTRAMUSCULAR | Status: AC | PRN
  Administered 2017-01-17 (×4): 1 mg via INTRAVENOUS

## 2017-01-17 MED ORDER — SODIUM CHLORIDE 0.9 % IV SOLN: INTRAVENOUS | Status: DC

## 2017-01-17 MED ORDER — MIDAZOLAM HCL 2 MG/2ML IJ SOLN
INTRAMUSCULAR | Status: AC
  Filled 2017-01-17: qty 4

## 2017-01-17 MED ORDER — FLUMAZENIL 0.5 MG/5ML IV SOLN
INTRAVENOUS | Status: AC
  Filled 2017-01-17: qty 5

## 2017-01-17 MED ORDER — NALOXONE HCL 0.4 MG/ML IJ SOLN
INTRAMUSCULAR | Status: AC
  Filled 2017-01-17: qty 1

## 2017-01-17 MED ORDER — FENTANYL CITRATE (PF) 100 MCG/2ML IJ SOLN
INTRAMUSCULAR | Status: AC
  Filled 2017-01-17: qty 4

## 2017-01-17 NOTE — Discharge Instructions (Signed)
Bone Marrow Aspiration and Bone Marrow Biopsy, Adult Bone marrow aspiration and bone marrow biopsy are procedures that are done to diagnose blood disorders. You may also have one of these procedures to help diagnose infections or some types of cancer. Bone marrow is the soft tissue that is inside your bones. Blood cells are produced in bone marrow. For bone marrow aspiration, a sample of tissue in liquid form is removed from inside your bone. For a bone marrow biopsy, a small core of bone marrow tissue is removed. These samples are examined under a microscope or tested in a lab. You may need these procedures if you have an abnormal complete blood count (CBC). The aspiration or biopsy sample is usually taken from the top of your hip bone. Sometimes, an aspiration sample is taken from your chest bone (sternum). Tell a health care provider about:  Any allergies you have.  All medicines you are taking, including vitamins, herbs, eye drops, creams, and over-the-counter medicines.  Any problems you or family members have had with anesthetic medicines.  Any blood or bone disorders you have.  Any surgeries you have had.  Any medical conditions you have.  Whether you are pregnant or you think that you may be pregnant. What are the risks? Generally, this is a safe procedure. However, problems may occur, including:  Infection.  Bleeding.  Persistent pain after the procedure.  Cracking (fracture) of the bone.  Allergic reactions to medicines.  What happens before the procedure? Staying hydrated Follow instructions from your health care provider about hydration, which may include:  Up to 2 hours before the procedure - you may continue to drink clear liquids, such as water, clear fruit juice, black coffee, and plain tea.  Eating and drinking restrictions Follow instructions from your health care provider about eating and drinking, which may include:  8 hours before the procedure - stop  eating heavy meals or foods such as meat, fried foods, or fatty foods.  6 hours before the procedure - stop eating light meals or foods, such as toast or cereal.  6 hours before the procedure - stop drinking milk or drinks that contain milk.  2 hours before the procedure - stop drinking clear liquids.  Medicines  Ask your health care provider about: ? Changing or stopping your regular medicines. This is especially important if you are taking diabetes medicines or blood thinners. ? Taking medicines such as aspirin and ibuprofen. These medicines can thin your blood. Do not take these medicines before your procedure if your health care provider instructs you not to.  You may be given antibiotic medicine to help prevent infection. General instructions  Plan to have someone take you home after the procedure.  If you will be going home right after the procedure, plan to have someone with you for 24 hours.  Ask your health care provider how your surgical site will be marked or identified. What happens during the procedure?  To reduce your risk of infection: ? Your health care team will wash or sanitize their hands. ? Your skin will be washed with soap. ? Hair may be removed from the surgical area.  An IV tube may be inserted into one of your veins.  The injection site will be cleaned with a germ-killing solution (antiseptic).  You will be given one or more of the following: ? A medicine to help you relax (sedative). ? A medicine to numb the area (local anesthetic). ? A medicine to make you fall asleep (  general anesthetic).  The bone marrow sample will be removed as follows: ? For an aspiration, a hollow needle will be inserted through your skin and into your bone. Bone marrow fluid will be drawn up into a syringe. ? For a biopsy, your health care provider will use a hollow needle to remove a core of tissue from your bone marrow.  The needle will be removed.  A bandage (dressing)  will be placed over the insertion site and taped in place. The procedure may vary among health care providers and hospitals. What happens after the procedure?  Your blood pressure, heart rate, breathing rate, and blood oxygen level will be monitored until the medicines you were given have worn off.  Your IV tube will be removed, and the insertion site will be checked for bleeding.  Do not drive for 24 hours if you were given a sedative. This information is not intended to replace advice given to you by your health care provider. Make sure you discuss any questions you have with your health care provider. Document Released: 06/06/2004 Document Revised: 12/22/2015 Document Reviewed: 11/15/2015 Elsevier Interactive Patient Education  2018 [employer].  Moderate Conscious Sedation, Adult, Care After These instructions provide you with information about caring for yourself after your procedure. Your health care provider may also give you more specific instructions. Your treatment has been planned according to current medical practices, but problems sometimes occur. Call your health care provider if you have any problems or questions after your procedure. What can I expect after the procedure? After your procedure, it is common:  To feel sleepy for several hours.  To feel clumsy and have poor balance for several hours.  To have poor judgment for several hours.  To vomit if you eat too soon.  Follow these instructions at home: For at least 24 hours after the procedure:   Do not: ? Participate in activities where you could fall or become injured. ? Drive. ? Use heavy machinery. ? Drink alcohol. ? Take sleeping pills or medicines that cause drowsiness. ? Make important decisions or sign legal documents. ? Take care of children on your own.  Rest. Eating and drinking  Follow the diet recommended by your health care provider.  If you vomit: ? Drink water, juice, or soup when you  can drink without vomiting. ? Make sure you have little or no nausea before eating solid foods. General instructions  Have a responsible adult stay with you until you are awake and alert.  Take over-the-counter and prescription medicines only as told by your health care provider.  If you smoke, do not smoke without supervision.  Keep all follow-up visits as told by your health care provider. This is important. Contact a health care provider if:  You keep feeling nauseous or you keep vomiting.  You feel light-headed.  You develop a rash.  You have a fever. Get help right away if:  You have trouble breathing. This information is not intended to replace advice given to you by your health care provider. Make sure you discuss any questions you have with your health care provider. Document Released: 03/24/2013 Document Revised: 11/06/2015 Document Reviewed: 09/23/2015 Elsevier Interactive Patient Education  [employer].

## 2017-01-17 NOTE — Consult Note (Signed)
Chief Complaint: Patient was seen in consultation today for CT-guided bone marrow biopsy  Referring Physician(s): Haivana Nakya G  Supervising Physician: Aletta Edouard  Patient Status: [facility] - Out-pt  History of Present Illness: Christine Garrett is a 61 y.o. female with history of recently diagnosed plasma cell neoplasm/multiple myeloma who presents today for staging CT guided bone marrow biopsy.  Past Medical History:  Diagnosis Date  . Arthritis    knees  . Carpal tunnel syndrome   . CHF (congestive heart failure) ([place])   . Diabetes mellitus    type 2  . GERD (gastroesophageal reflux disease)   . Heart murmur    "Little, No concerns" per Dr  Dannielle Burn pt reported.  . Hypertension    controlled using a guided approch with plasma renin activity  . Neuropathy   . Peripheral vascular disease ([place])   . Sleep apnea    Uses CPAP    Past Surgical History:  Procedure Laterality Date  . BIOPSY  11/28/2016   Procedure: BIOPSY;  Surgeon: Rogene Houston, MD;  [facility]: [facility];  Service: Endoscopy;;  gastric  . [place]  . CERVICAL CONE BIOPSY     cervical lesion  . ESOPHAGOGASTRODUODENOSCOPY (EGD) WITH PROPOFOL N/A 11/28/2016   Procedure: ESOPHAGOGASTRODUODENOSCOPY (EGD) WITH PROPOFOL;  Surgeon: Rogene Houston, MD;  [facility]: [facility];  Service: Endoscopy;  Laterality: N/A;  9:25  . lipoma removal     right shoulder 2001  . MULTIPLE EXTRACTIONS WITH ALVEOLOPLASTY Bilateral 08/24/2012   Procedure: MULTIPLE EXTRACTION WITH ALVEOLOPLASTY BIOPSY OF RIGHT AND LEFT MANDIBLE ;  Surgeon: Gae Bon, DDS;  Location[facility];  Service: Oral Surgery;  Laterality: Bilateral;  . POSTERIOR LUMBAR FUSION 4 LEVEL N/A 12/26/2016   Procedure: THORACIC EIGHT TUMOR RESECTION, THORACIC SIX- THORACIC TEN POSTERIOR SPINAL FUSION;  Surgeon: Ashok Pall, MD;  Location: [facility];  Service: Neurosurgery;  Laterality: N/A;  THORACIC 8 TUMOR RESECTION, THORACIC 6- THORACIC 10 POSTERIOR  SPINAL FUSION  . ROTATOR CUFF REPAIR     right shoulder  . TUBAL LIGATION      Allergies: No known allergies  Medications: Prior to Admission medications   Medication Sig Start Date End Date Taking? Authorizing Provider  amoxicillin (AMOXIL) 500 MG capsule Take 1,000 mg by mouth 2 (two) times daily.   Yes [provider]  chlorthalidone (HYGROTON) 25 MG tablet Take 25 mg by mouth 2 (two) times daily.  11/17/16  Yes [provider]  clarithromycin (BIAXIN) 500 MG tablet Take 500 mg by mouth 2 (two) times daily. 12/12/16  Yes [provider]  dexamethasone (DECADRON) 6 MG tablet Take 6 mg by mouth daily.  12/16/16  Yes [provider]  dicyclomine (BENTYL) 10 MG capsule Take 1 capsule (10 mg total) by mouth 3 (three) times daily as needed for spasms. 11/28/16  Yes Rehman, Mechele Dawley, MD  diltiazem (CARDIZEM CD) 300 MG 24 hr capsule Take 379ms by mouth daily 10/19/16  Yes [provider]  enalapril (VASOTEC) 20 MG tablet Take 20 mg by mouth 2 (two) times daily.    Yes [provider]  insulin degludec (TRESIBA FLEXTOUCH) 100 UNIT/ML SOPN FlexTouch Pen Inject 0.5 mLs (50 Units total) into the skin daily at 10 pm. 11/20/16  Yes Nida, GMarella Chimes MD  Insulin Pen Needle (B-D ULTRAFINE III SHORT PEN) 31G X 8 MM MISC 1 each by Does not apply route as directed. 11/13/16  Yes NCassandria Anger  MD  metFORMIN (GLUCOPHAGE) 1000 MG tablet Take 1,000 mg by mouth 2 (two) times daily.    Yes [provider]  omeprazole (PRILOSEC) 20 MG capsule Take 20 mg by mouth 2 (two) times daily.   Yes [provider]  oxyCODONE (OXY IR/ROXICODONE) 5 MG immediate release tablet Take 1-2 tablets (5-10 mg total) by mouth every 6 (six) hours as needed for breakthrough pain. 12/30/16  Yes Ashok Pall, MD  potassium chloride SA (K-DUR,KLOR-CON) 20 MEQ tablet Take 1 tablet (20 mEq total) by mouth daily. 11/29/16  Yes Rehman, Mechele Dawley, MD  tiZANidine  (ZANAFLEX) 4 MG tablet Take 1 tablet (4 mg total) by mouth every 6 (six) hours as needed for muscle spasms. 12/30/16  Yes Ashok Pall, MD  traMADol (ULTRAM) 50 MG tablet Take 50 mg by mouth 2 (two) times daily as needed for moderate pain.   Yes [provider]     Family History  Problem Relation Age of Onset  . Diabetes Mother   . Hypertension Mother   . Heart disease Mother   . Diabetes Father   . Heart disease Sister   . Diabetes Sister     Social History   Social History  . Marital status: Married    Spouse name: N/A  . Number of children: N/A  . Years of education: N/A   Social History Main Topics  . Smoking status: Former Smoker    Packs/day: 0.50    Years: 6.00    Types: Cigarettes    Quit date: 11/23/2011  . Smokeless tobacco: Never Used     Comment: quit summer 2013  . Alcohol use No  . Drug use: No  . Sexual activity: Not Currently    Birth control/ protection: Post-menopausal   Other Topics Concern  . None   Social History Narrative  . None      Review of Systems denies fever, headache, chest pain, dyspnea, cough, abdominal pain, nausea, vomiting or abnormal bleeding. She does have back pain as well as tender area right medial calf region.  Vital Signs: BP (!) 174/90   Pulse 93   Temp 98.7 F (37.1 C) (Oral)   Resp 16   Ht '5\' 8"'  (1.727 m)   Wt 208 lb (94.3 kg)   SpO2 100%   BMI 31.63 kg/m   Physical Exam awake, alert. Chest clear to auscultation bilaterally. Heart with regular rate and rhythm. Abdomen soft, positive bowel sounds, nontender. Lower extremities with no significant edema but indurated soft tissue fullness and tenderness noted right medial calf region.  Mallampati Score:     Imaging: Dg Thoracic Spine 2 View  Result Date: 12/26/2016 CLINICAL DATA:  T6 through T10 fusion with tumor resection EXAM: DG C-ARM 61-120 MIN; THORACIC SPINE 2 VIEWS COMPARISON:  12/17/2016 FINDINGS: Two low resolution intraoperative spot views of  the spine. Surgical retractor devices are present. Fixating screws within the mid and lower thoracic spine. IMPRESSION: Intraoperative fluoroscopic assistance provided during thoracic spine surgery. Electronically Signed   By: Donavan Foil M.D.   On: 12/26/2016 22:17   Nm Pet Image Initial (pi) Whole Body  Result Date: 01/16/2017 CLINICAL DATA:  Initial treatment strategy for multiple myeloma. EXAM: NUCLEAR MEDICINE PET WHOLE BODY TECHNIQUE: 10.2 mCi F-18 FDG was injected intravenously. Full-ring PET imaging was performed from the vertex to the feet after the radiotracer. CT data was obtained and used for attenuation correction and anatomic localization. FASTING BLOOD GLUCOSE:  Value: 149 mg/dl COMPARISON:  None. FINDINGS:  HEAD/NECK No hypermetabolic activity in the scalp. No hypermetabolic cervical lymph nodes. CHEST Mild uptake in normal size axillary nodes is likely reactive. Some of the uptake in right axillary nodes may be due to a right-sided injection. There is evidence of postoperative changes are seen in the thoracic spine. Most of the uptake in the posterior thoracic spine is thought to be due to previous surgery. ABDOMEN/PELVIS Focal uptake in the musculature anterior to the left acetabulum with no CT correlate is likely artifactual or focal muscular uptake. No convincing evidence of FDG avid disease identified in the soft tissues of the abdomen or pelvis. SKELETON Two foci of uptake in the right tibia are identified. The more superior region of uptake is seen on image 333. There is no CT correlate but the maximum SUV is 2.44. Most of the mild uptake in the thoracic spine is due to previous surgery and hardware. Focal uptake in the T9 vertebral body without CT correlate is identified. There is also lytic lesion in the posterior left T9 vertebral body extending into the pedicle and lamina with increased FDG uptake. Regions of lucency in the right femoral head and acetabulum correlate with mild  increased up. Morphology suggests degenerative change rather than multiple myeloma. EXTREMITIES No abnormal hypermetabolic activity in the lower extremities. IMPRESSION: 1. Two regions of rounded uptake in the proximal right tibia with no CT correlate are moderately worrisome for disease involvement. Recommend attention on follow-up. Uptake in T9 as described above is consistent with known multiple myeloma involvement. No other convincing evidence of FDG avid bony disease. Lucencies of mild uptake in the right acetabulum and femoral head are favored to be degenerative over disease involvement. 2. No soft tissue metastases are noted. Electronically Signed   By: Dorise Bullion III M.D   On: 01/16/2017 13:48   Dg C-arm 61-120 Min  Result Date: 12/26/2016 CLINICAL DATA:  T6 through T10 fusion with tumor resection EXAM: DG C-ARM 61-120 MIN; THORACIC SPINE 2 VIEWS COMPARISON:  12/17/2016 FINDINGS: Two low resolution intraoperative spot views of the spine. Surgical retractor devices are present. Fixating screws within the mid and lower thoracic spine. IMPRESSION: Intraoperative fluoroscopic assistance provided during thoracic spine surgery. Electronically Signed   By: Donavan Foil M.D.   On: 12/26/2016 22:17    Labs:  CBC:  Recent Labs  11/22/16 1253 12/26/16 1054 12/26/16 1504 12/26/16 1609 12/26/16 1720 12/30/16 1547  WBC 5.6 12.5*  --   --   --  10.8*  HGB 12.2 12.7 11.9* 11.2* 10.5* 11.2*  HCT 35.4* 36.0 35.0* 33.0* 31.0* 33.1*  PLT 270 313  --   --   --  298    COAGS: No results for input(s): INR, APTT in the last 8760 hours.  BMP:  Recent Labs  11/22/16 1253 12/16/16 1001 12/26/16 1504 12/26/16 1609 12/26/16 1720 12/30/16 1547  NA 131* 136 137 137 137 132*  K 3.1* 4.0 3.8 4.1 4.0 3.6  CL 94* 103  --   --   --  96*  CO2 25 26  --   --   --  28  GLUCOSE 324* 154* 202* 170* 137* 187*  BUN 11 10  --   --   --  10  CALCIUM 9.5 10.0  --   --   --  9.1  CREATININE 0.86 0.86  --    --   --  0.96  GFRNONAA >60  --   --   --   --  >60  GFRAA >60  --   --   --   --  >60    LIVER FUNCTION TESTS:  Recent Labs  12/30/16 1547  BILITOT 0.6  AST 21  ALT 22  ALKPHOS 77  PROT 7.4  ALBUMIN 2.9*    TUMOR MARKERS: No results for input(s): AFPTM, CEA, CA199, CHROMGRNA in the last 8760 hours.  Assessment and Plan: 61 y.o. female with history of recently diagnosed plasma cell neoplasm/multiple myeloma who presents today for staging CT guided bone marrow biopsy.Risks and benefits discussed with the patient/daughter including, but not limited to bleeding, infection, damage to adjacent structures or low yield requiring additional tests.All of the patient's questions were answered, patient is agreeable to proceed.Consent signed and in chart.     Thank you for this interesting consult.  I greatly enjoyed meeting Christine Garrett and look forward to participating in their care.  A copy of this report was sent to the requesting provider on this date.  Electronically Signed: D. Rowe Robert, PA-C 01/17/2017, 9:37 AM   I spent a total of 20 minutes in face to face in clinical consultation, greater than 50% of which was counseling/coordinating care for CT-guided bone marrow biopsy

## 2017-01-17 NOTE — Progress Notes (Signed)
Bandage to the rt lower back area is clean, dry, and intact. Pt is alert, talkative and eating and tolerating it well. Daughter is at the bedside. Pt is alert, calm and has no s/s of distress. Will continue to monitor and tx pt according to MD orders.

## 2017-01-17 NOTE — Progress Notes (Signed)
Dressing to the back is clean, dry and intact.

## 2017-01-17 NOTE — Procedures (Signed)
Interventional Radiology Procedure Note  Procedure: CT guided aspirate and core biopsy of right iliac bone Complications: None Recommendations: - Bedrest supine x 1 hrs - Follow biopsy results  Rondel Episcopo T. Latera Mclin, M.D Pager:  [phone]   

## 2017-01-22 ENCOUNTER — Ambulatory Visit (HOSPITAL_BASED_OUTPATIENT_CLINIC_OR_DEPARTMENT_OTHER): Payer: Medicare Other | Admitting: Hematology and Oncology

## 2017-01-22 ENCOUNTER — Ambulatory Visit (HOSPITAL_COMMUNITY)
Admission: RE | Admit: 2017-01-22 | Discharge: 2017-01-22 | Disposition: A | Discharge status: Home / Self Care | Payer: Medicare Other | Source: Ambulatory Visit | Attending: Hematology and Oncology | Admitting: Hematology and Oncology

## 2017-01-22 ENCOUNTER — Telehealth: Payer: Self-pay

## 2017-01-22 ENCOUNTER — Telehealth: Payer: Self-pay | Admitting: Hematology and Oncology

## 2017-01-22 ENCOUNTER — Encounter: Payer: Self-pay | Admitting: *Deleted

## 2017-01-22 VITALS — BP 125/73 | HR 86 | Temp 98.8°F | Resp 18 | Ht 68.0 in | Wt 208.4 lb

## 2017-01-22 DIAGNOSIS — M7989 Other specified soft tissue disorders: Secondary | ICD-10-CM

## 2017-01-22 DIAGNOSIS — R931 Abnormal findings on diagnostic imaging of heart and coronary circulation: Secondary | ICD-10-CM | POA: Insufficient documentation

## 2017-01-22 DIAGNOSIS — I8001 Phlebitis and thrombophlebitis of superficial vessels of right lower extremity: Secondary | ICD-10-CM | POA: Diagnosis not present

## 2017-01-22 DIAGNOSIS — C9000 Multiple myeloma not having achieved remission: Secondary | ICD-10-CM

## 2017-01-22 MED ORDER — LORAZEPAM 0.5 MG PO TABS: 0.5000 mg | ORAL_TABLET | Freq: Four times a day (QID) | ORAL | 0 refills | Status: DC | PRN

## 2017-01-22 MED ORDER — LENALIDOMIDE 25 MG PO CAPS: ORAL_CAPSULE | ORAL | 3 refills | Status: DC

## 2017-01-22 MED ORDER — RIVAROXABAN 10 MG PO TABS: 10.0000 mg | ORAL_TABLET | Freq: Every day | ORAL | 3 refills | Status: DC

## 2017-01-22 MED ORDER — DEXAMETHASONE 4 MG PO TABS: ORAL_TABLET | ORAL | 3 refills | Status: DC

## 2017-01-22 MED ORDER — ACYCLOVIR 400 MG PO TABS: 400.0000 mg | ORAL_TABLET | Freq: Two times a day (BID) | ORAL | 3 refills | Status: DC

## 2017-01-22 MED ORDER — ONDANSETRON HCL 8 MG PO TABS: 8.0000 mg | ORAL_TABLET | Freq: Two times a day (BID) | ORAL | 1 refills | Status: DC | PRN

## 2017-01-22 MED ORDER — PROCHLORPERAZINE MALEATE 10 MG PO TABS: 10.0000 mg | ORAL_TABLET | Freq: Four times a day (QID) | ORAL | 1 refills | Status: DC | PRN

## 2017-01-22 MED ORDER — LIDOCAINE-PRILOCAINE 2.5-2.5 % EX CREA: TOPICAL_CREAM | CUTANEOUS | 3 refills | Status: DC

## 2017-01-22 NOTE — Telephone Encounter (Signed)
Pt had question about medication dexamethasone and when to take. Explained she takes it on the days she gets her chemo shot (velcade).

## 2017-01-22 NOTE — Telephone Encounter (Signed)
Scheduled appt per 8/8 los - Gave patient AVS and calender per los. 

## 2017-01-22 NOTE — Progress Notes (Signed)
*  PRELIMINARY RESULTS* Vascular Ultrasound Right lower extremity venous duplex has been completed.  Preliminary findings: no evidence of DVT. Superficial thrombosis noted in the right greater saphenous vein at prox to mid calf and varicose veins of the mid calf.   Called results to [place] at Dr. Clydene Laming office. Patient ok to leave. MD will send in Rx to patient's pharmacy.     Landry Mellow, RDMS, RVT  01/22/2017, 11:11 AM

## 2017-01-23 ENCOUNTER — Telehealth: Payer: Self-pay | Admitting: Pharmacist

## 2017-01-23 DIAGNOSIS — C9000 Multiple myeloma not having achieved remission: Secondary | ICD-10-CM

## 2017-01-23 MED ORDER — LENALIDOMIDE 25 MG PO CAPS: ORAL_CAPSULE | ORAL | 0 refills | Status: DC

## 2017-01-23 NOTE — Telephone Encounter (Signed)
Oral Oncology Pharmacist Encounter  Received new prescription for Revlimid for the induction treatment of newly diagnosed multiple myeloma in conjunction with Velcade and dexamethasone, planned duration 4-6 cycles.  Labs from 7/16 and 8/3 assessed, OK for treatment.  Current medication list in Epic reviewed, no DDIs with Revlimid identified. Noted patient on Xarelto for VTE, this will be adequate thromboprophylaxis for Revlimid.  Prescription has been e-scribed to [facility] in [place], [place] (ph: [phone]) for benefits analysis and approval as Revlimid is a limited distribution medication.  Noted patient scheduled for chemotherapy education on 01/24/17 and planned start date of RVD is 01/30/17.  Oral Oncology Clinic will continue to follow.  Johny Drilling, PharmD, BCPS, BCOP 01/23/2017 12:22 PM Oral Oncology Clinic [phone]

## 2017-01-23 NOTE — Telephone Encounter (Signed)
Thank you. When should we expect the patient to receive Revlimid?

## 2017-01-24 ENCOUNTER — Encounter: Payer: Self-pay | Admitting: Hematology and Oncology

## 2017-01-24 ENCOUNTER — Other Ambulatory Visit: Payer: Medicare Other

## 2017-01-24 DIAGNOSIS — I8001 Phlebitis and thrombophlebitis of superficial vessels of right lower extremity: Secondary | ICD-10-CM | POA: Insufficient documentation

## 2017-01-24 NOTE — Assessment & Plan Note (Signed)
Patient presenting with what appears to be acute thrombophlebitis in the superficial veins in the right lower extremity, likely associated with her and malignancy diagnosis. Patient is at risk for developing deep vein thrombosis.  Plan: --Doppler US Rt LE. If positive for DVT, will start therapeutic anticoagulation. If negative, we'll start patient on a prophylactic dose of Rivaroxaban (Xarelto) 10 mg oral daily as patient will need some form of anticoagulation due to being placed on lenalidomide for treatment of her underlying multiple myeloma in any case. 

## 2017-01-24 NOTE — Progress Notes (Signed)
Pt's Lidocaine-Prilocaine was approved from 10/26/16 to 04/24/17.

## 2017-01-24 NOTE — Assessment & Plan Note (Signed)
61 year old female who initially presented with significant weight loss in the context of H. pylori-associated gastritis and alcohol abuse. At the same time, an incidental discovery of a lower thoracic vertebral mass in the context of chronic numbness and weakness in the lower extremities. MRI of the thoracic spine demonstrated a destructive lesion at T8 level and patient underwent hematological treatment. 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 Staging evaluation contacted by our clinic confirmed presence of IgG kappa multiple myeloma. At the present time, cytogenetic studies are still pending and we cannot complete the staging assessment of the disease.   Based on the active and symptomatic disease, I do recommend patient to undergo systemic therapy. Today, we have reviewed the diagnosis, extend of disease, recommended treatment options including phases of therapy such as an induction, consolidation, and maintenance, component medications of the lenalidomide, bortezomib, low dose dexamethasone treatment. I have described the medications, mechanism of action, schedule of administration, and potential toxicities of the therapy to the patient. Patient voiced understanding of my explanation and agreed to proceed with the treatment as recommended.  Patient is edentulous and does not need any additional workup prior to initiation of zoledronic acid.  Plan: --[facility] BMT service for evaluation for possible future consolidation therapy with high-dose chemotherapy and autologous stem cell rescue --Place Infuse-a-Port as the patient is very difficult to obtain access and she will be needing significant number of lab draws. --RTC 1 week: clinic visit and possible initiation of lenalidomide 71m PO QDay, d1-14 + bortezomib 1.397mm2, d1,8,15 + dexamethasone 403mO QWk --Will start patient on zoledronic acid 4 mg every 4-6 weeks to coincide with the treatment  visits --Supportive care with Bactrim and acyclovir to reduce the risk of PCP and VZV infections respectively

## 2017-01-24 NOTE — Progress Notes (Signed)
Submitted auth request for Lidocaine/Prilocaine today.  Status is pending.  °

## 2017-01-24 NOTE — Progress Notes (Signed)
[name]:  Assessment: Multiple myeloma not having achieved remission [facility]) 61 year old female who initially presented with significant weight loss in the context of H. pylori-associated gastritis and alcohol abuse. At the same time, an incidental discovery of a lower thoracic vertebral mass in the context of chronic numbness and weakness in the lower extremities. MRI of the thoracic spine demonstrated a destructive lesion at T8 level and patient underwent hematological treatment. 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 Staging evaluation contacted by our clinic confirmed presence of IgG kappa multiple myeloma. At the present time, cytogenetic studies are still pending and we cannot complete the staging assessment of the disease.   Based on the active and symptomatic disease, I do recommend patient to undergo systemic therapy. Today, we have reviewed the diagnosis, extend of disease, recommended treatment options including phases of therapy such as an induction, consolidation, and maintenance, component medications of the lenalidomide, bortezomib, low dose dexamethasone treatment. I have described the medications, mechanism of action, schedule of administration, and potential toxicities of the therapy to the patient. Patient voiced understanding of my explanation and agreed to proceed with the treatment as recommended.  Patient is edentulous and does not need any additional workup prior to initiation of zoledronic acid.  Plan: --[place] BMT service for evaluation for possible future consolidation therapy with high-dose chemotherapy and autologous stem cell rescue --Place Infuse-a-Port as the patient is very difficult to obtain access and she will be needing significant number of lab draws. --RTC 1 week: clinic visit and possible initiation of lenalidomide 21m PO QDay, d1-14 + bortezomib 1.31mm2, d1,8,15 +  dexamethasone 4067mO QWk --Will start patient on zoledronic acid 4 mg every 4-6 weeks to coincide with the treatment visits --Supportive care with Bactrim and acyclovir to reduce the risk of PCP and VZV infections respectively     Phlebitis and thrombophlebitis of superficial vessels of right lower extremity Patient presenting with what appears to be acute thrombophlebitis in the superficial veins in the right lower extremity, likely associated with her and malignancy diagnosis. Patient is at risk for developing deep vein thrombosis.  Plan: --Doppler US [place] LE. If positive for DVT, will start therapeutic anticoagulation. If negative, we'll start patient on a prophylactic dose of Rivaroxaban (Xarelto) 10 mg oral daily as patient will need some form of anticoagulation due to being placed on lenalidomide for treatment of her underlying multiple myeloma in any case.  Voice recognition software was used and creation of this note. Despite my best effort at editing the text, some misspelling/errors may have occurred.  Orders Placed This Encounter  Procedures  . IR FLUORO GUIDE PORT INSERTION LEFT    Standing Status:   Future    Standing Expiration Date:   03/24/2018    Order Specific Question:   Reason for Exam (SYMPTOM  OR DIAGNOSIS REQUIRED)    Answer:   Multiple myeloma, will need labs and treatments    Order Specific Question:   Preferred Imaging Location?    Answer:   [facility] Order Specific Question:   Is the patient pregnant?    Answer:   No  . CBC with Differential    Standing Status:   Future    Standing Expiration Date:   01/22/2018  . Comprehensive metabolic panel    Standing Status:   Future    Standing Expiration Date:   01/22/2018  . Uric acid    Standing Status:  Future    Standing Expiration Date:   01/22/2018  . Magnesium    Standing Status:   Future    Standing Expiration Date:   01/22/2018  . Phosphorus    Standing Status:   Future    Standing Expiration  Date:   01/22/2018  . PHYSICIAN COMMUNICATION ORDER    Order aspirin 81-367m OR Coumadin for thromboembolic prophylaxis via "add orders". Target Coumadin INR = 2-3.    Cancer Staging No matching staging information was found for the patient.  All questions were answered.  . The patient knows to call the clinic with any problems, questions or concerns.  This note was electronically signed.    History of Presenting Illness DRoselina Burguenois a  61y[place]o. [place]. Please see oncologic history below for details.  Patient initially presented with epigastric abdominal pain that started in Apr 2018. Patient was previously self medicating with Aleve and Naprosyn or chronic pains. She did not have any melena or hematochezia. Due to the intensity of the pain, patient underwent EGD on 11/29/16 that demonstrates presence of H. pylori-associated gastritis. At the time of presentation, family reported weight loss of 60 pounds over 3-4 month period. Previously, patient has been habitually drinking alcohol up to 6 beverages per day, quit drinking mid-May 2018. Continued to have abdominal pain and underwent imaging assessment with CT of the abdomen and pelvis on 12/16/16 did demonstrate presence of a mass lesion with spinal cord compression at level of T8. Compression was relieved surgically. She has been recovering well from surgery.  Patient presents to the clinic after completing her staging evaluation of the multiple myeloma. Since the last visit to the clinic, patient complains of development of new skin lesions over the right lower extremity associated with pain, discomfort, and skin discoloration. In particular, there is a site at the proximal posterior medial right lower extremity which produces significant tenderness, pain. Patient denies any new shortness of breath or chest pain.   Oncological/hematological  History: --Labs, 11/22/16: Ca 9.5, Cr 0.86; WBC 5.6, Hgb 12.2, Plt 270;  --EGD, 11/29/16: Gastritis positive for H. pylori. Treated with omeprazole, clarithromycin, and amoxicillin x14d  --CT A/P, 12/16/16: Left T8 lytic lesion with soft tissue extension into the spinal canal with cord compression. Fatty liver measuring 19.6cm. 0.3cm lt lower pole renal calculus, no hydronephrosis;  --MRI T-spine, 12/17/16: An enhancing mass on the left side at T8 invading the canal and compressing the spinal cord.   Oncological/hematological History:   [place] achieved [place]   12/26/2016 Initial Diagnosis    Multiple myeloma not having achieved remission ([facility]: Originally presented with a mass resulting in compression of the spinal cord. Underwent surgery on 12/26/16 for decompression. --Initial Surgical Pathology: Plasma cell neoplasm involvement, IHC -- positive for CD138, CD79a, CD56, LCA, CD43, kappa light chains & negative for CD20, lambda light chains; --Baseline labs, 12/30/16: tProt 7.4, Alb 2.9, Ca 9.1, Cr 0.96, AP 77; SPEP/SIFE -- M-Spike 1.0g/dL, monoclonal IgG kappa present; IgA 51, IgG 1477, IgM 17; kappa 77.6, lambda 7.4, KLR 10.35; LDH 161, beta-2 microglobulin 2.3; WBC 10.8, Hgb 11.2, Plt 298;      01/16/2017 Imaging    PET-CT: Positive pathological uptake in T9 and proximal right tibia. No other hypermetabolic disease      81/5/9458Pathology Results    Bone Marrow Biopsy: Hypercellular bone marrow with 16% involvement by plasma cell process  Medical History: Past Medical History:  Diagnosis Date  . Arthritis    knees  . Carpal tunnel syndrome   . CHF (congestive heart failure) ([place])   . Diabetes mellitus    type 2  . GERD (gastroesophageal reflux disease)   . Heart murmur    "Little, No concerns" per Dr  Dannielle Burn pt reported.  . Hypertension    controlled using a guided approch with plasma renin activity  . Neuropathy   . Peripheral vascular  disease ([place])   . Sleep apnea    Uses CPAP    Surgical History: Past Surgical History:  Procedure Laterality Date  . BIOPSY  11/28/2016   Procedure: BIOPSY;  Surgeon: Rogene Houston, MD;  [facility];  Service: Endoscopy;;  gastric  . [place]  . CERVICAL CONE BIOPSY     cervical lesion  . ESOPHAGOGASTRODUODENOSCOPY (EGD) WITH PROPOFOL N/A 11/28/2016   Procedure: ESOPHAGOGASTRODUODENOSCOPY (EGD) WITH PROPOFOL;  Surgeon: Rogene Houston, MD;  [facility];  Service: Endoscopy;  Laterality: N/A;  9:25  . lipoma removal     right shoulder 2001  . MULTIPLE EXTRACTIONS WITH ALVEOLOPLASTY Bilateral 08/24/2012   Procedure: MULTIPLE EXTRACTION WITH ALVEOLOPLASTY BIOPSY OF RIGHT AND LEFT MANDIBLE ;  Surgeon: Gae Bon, DDS;  Location: [place];  Service: Oral Surgery;  Laterality: Bilateral;  . POSTERIOR LUMBAR FUSION 4 LEVEL N/A 12/26/2016   Procedure: THORACIC EIGHT TUMOR RESECTION, THORACIC SIX- THORACIC TEN POSTERIOR SPINAL FUSION;  Surgeon: Ashok Pall, MD;  Location: [place];  Service: Neurosurgery;  Laterality: N/A;  THORACIC 8 TUMOR RESECTION, THORACIC 6- THORACIC 10 POSTERIOR SPINAL FUSION  . ROTATOR CUFF REPAIR     right shoulder  . TUBAL LIGATION      Family History: Family History  Problem Relation Age of Onset  . Diabetes Mother   . Hypertension Mother   . Heart disease Mother   . Diabetes Father   . Heart disease Sister   . Diabetes Sister     Social History: Social History   Social History  . Marital status: Married    Spouse name: N/A  . Number of children: N/A  . Years of education: N/A   Occupational History  . Not on file.   Social History Main Topics  . Smoking status: Former Smoker    Packs/day: 0.50    Years: 6.00    Types: Cigarettes    Quit date: 11/23/2011  . Smokeless tobacco: Never Used     Comment: quit summer 2013  . Alcohol use No  . Drug use: No  . Sexual activity: Not Currently    Birth control/ protection:  Post-menopausal   Other Topics Concern  . Not on file   Social History Narrative  . No narrative on file    Allergies: Allergies  Allergen Reactions  . No Known Allergies     Medications:  Current Outpatient Prescriptions  Medication Sig Dispense Refill  . acyclovir (ZOVIRAX) 400 MG tablet Take 1 tablet (400 mg total) by mouth 2 (two) times daily. 60 tablet 3  . amoxicillin (AMOXIL) 500 MG capsule Take 1,000 mg by mouth 2 (two) times daily.  0  . chlorthalidone (HYGROTON) 25 MG tablet Take 25 mg by mouth 2 (two) times daily.   11  . clarithromycin (BIAXIN) 500 MG tablet Take 500 mg by mouth 2 (two) times daily.  0  . dexamethasone (DECADRON) 4 MG tablet Take 10 tablets (40 mg)  on days 1, 8, and 15 of chemo. Repeat every 21 days. 30 tablet 3  . dexamethasone (DECADRON) 6 MG tablet Take 6 mg by mouth daily.   0  . dicyclomine (BENTYL) 10 MG capsule Take 1 capsule (10 mg total) by mouth 3 (three) times daily as needed for spasms. 90 capsule 5  . diltiazem (CARDIZEM CD) 300 MG 24 hr capsule Take 378ms by mouth daily  3  . enalapril (VASOTEC) 20 MG tablet Take 20 mg by mouth 2 (two) times daily.     . insulin degludec (TRESIBA FLEXTOUCH) 100 UNIT/ML SOPN FlexTouch Pen Inject 0.5 mLs (50 Units total) into the skin daily at 10 pm. 5 pen 2  . Insulin Pen Needle (B-D ULTRAFINE III SHORT PEN) 31G X 8 MM MISC 1 each by Does not apply route as directed. 100 each 3  . lenalidomide (REVLIMID) 25 MG capsule Take one capsule (282m by mouth once daily on days 1-14 every 21 days. Auth# [id number] 14 capsule 0  . lidocaine-prilocaine (EMLA) cream Apply to affected area once 30 g 3  . LORazepam (ATIVAN) 0.5 MG tablet Take 1 tablet (0.5 mg total) by mouth every 6 (six) hours as needed (Nausea or vomiting). 30 tablet 0  . metFORMIN (GLUCOPHAGE) 1000 MG tablet Take 1,000 mg by mouth 2 (two) times daily.     . Marland Kitchenmeprazole (PRILOSEC) 20 MG capsule Take 20 mg by mouth 2 (two) times daily.    .  ondansetron (ZOFRAN) 8 MG tablet Take 1 tablet (8 mg total) by mouth 2 (two) times daily as needed (Nausea or vomiting). 30 tablet 1  . oxyCODONE (OXY IR/ROXICODONE) 5 MG immediate release tablet Take 1-2 tablets (5-10 mg total) by mouth every 6 (six) hours as needed for breakthrough pain. 50 tablet 0  . potassium chloride SA (K-DUR,KLOR-CON) 20 MEQ tablet Take 1 tablet (20 mEq total) by mouth daily. 30 tablet 2  . prochlorperazine (COMPAZINE) 10 MG tablet Take 1 tablet (10 mg total) by mouth every 6 (six) hours as needed (Nausea or vomiting). 30 tablet 1  . rivaroxaban (XARELTO) 10 MG TABS tablet Take 1 tablet (10 mg total) by mouth daily. 30 tablet 3  . tiZANidine (ZANAFLEX) 4 MG tablet Take 1 tablet (4 mg total) by mouth every 6 (six) hours as needed for muscle spasms. 60 tablet 0  . traMADol (ULTRAM) 50 MG tablet Take 50 mg by mouth 2 (two) times daily as needed for moderate pain.     No current facility-administered medications for this visit.     Review of Systems: Review of Systems  Musculoskeletal: Positive for back pain and gait problem.  Neurological: Positive for extremity weakness, gait problem and numbness.  All other systems reviewed and are negative.    PHYSICAL EXAMINATION Blood pressure 125/73, pulse 86, temperature 98.8 F (37.1 C), temperature source Oral, resp. rate 18, height '5\' 8"'  (1.727 m), weight 208 lb 6.4 oz (94.5 kg), SpO2 100 %.  ECOG PERFORMANCE STATUS: 2 - Symptomatic, <50% confined to bed  Physical Exam  Constitutional: She is oriented to person, place, and time and well-developed, well-nourished, and in no distress.  HENT:  Head: Normocephalic and atraumatic.  Mouth/Throat: Oropharynx is clear and moist. No oropharyngeal exudate.  Eyes: Pupils are equal, round, and reactive to light. EOM are normal. No scleral icterus.  Neck: No thyromegaly present.  Cardiovascular: Normal rate, regular rhythm and normal heart sounds.  Exam reveals no gallop.   No  murmur heard. Pulmonary/Chest:  Effort normal and breath sounds normal. She has no wheezes.  Abdominal: Soft. Bowel sounds are normal. She exhibits no distension and no mass. There is no tenderness. There is no rebound.  Musculoskeletal: Normal range of motion. She exhibits no edema.  Patient has significant tenderness to palpation over the mid to lower thoracic spine consistent with recent surgery there.  New development of tender spots over the right medial leg, the largest one over the proximal right medial leg associated with darkening of the skin, hyperthermia, and severe tenderness.  Neurological: She is alert and oriented to person, place, and time.  Patient ambulates with difficulty and uses a walker.     LABORATORY DATA: I have personally reviewed the data as listed: Hospital Outpatient Visit on 01/17/2017  Component Date Value Ref Range Status  . Glucose-Capillary 01/17/2017 154* 65 - 99 mg/dL Final  Hospital Outpatient Visit on 01/17/2017  Component Date Value Ref Range Status  . Sodium 01/17/2017 141  135 - 145 mmol/L Final  . Potassium 01/17/2017 3.6  3.5 - 5.1 mmol/L Final  . Chloride 01/17/2017 103  101 - 111 mmol/L Final  . CO2 01/17/2017 27  22 - 32 mmol/L Final  . Glucose, Bld 01/17/2017 152* 65 - 99 mg/dL Final  . BUN 01/17/2017 12  6 - 20 mg/dL Final  . Creatinine, Ser 01/17/2017 0.74  0.44 - 1.00 mg/dL Final  . Calcium 01/17/2017 9.9  8.9 - 10.3 mg/dL Final  . GFR calc non Af Amer 01/17/2017 >60  >60 mL/min Final  . GFR calc Af Amer 01/17/2017 >60  >60 mL/min Final   Comment: (NOTE) The eGFR has been calculated using the CKD EPI equation. This calculation has not been validated in all clinical situations. eGFR's persistently <60 mL/min signify possible Chronic Kidney Disease.   . Anion gap 01/17/2017 11  5 - 15 Final  . WBC 01/17/2017 5.7  4.0 - 10.5 K/uL Final  . RBC 01/17/2017 3.67* 3.87 - 5.11 MIL/uL Final  . Hemoglobin 01/17/2017 11.5* 12.0 - 15.0 g/dL  Final  . HCT 01/17/2017 32.9* 36.0 - 46.0 % Final  . MCV 01/17/2017 89.6  78.0 - 100.0 fL Final  . MCH 01/17/2017 31.3  26.0 - 34.0 pg Final  . MCHC 01/17/2017 35.0  30.0 - 36.0 g/dL Final  . RDW 01/17/2017 12.8  11.5 - 15.5 % Final  . Platelets 01/17/2017 419* 150 - 400 K/uL Final  . Neutrophils Relative % 01/17/2017 57  % Final  . Neutro Abs 01/17/2017 3.3  1.7 - 7.7 K/uL Final  . Lymphocytes Relative 01/17/2017 36  % Final  . Lymphs Abs 01/17/2017 2.1  0.7 - 4.0 K/uL Final  . Monocytes Relative 01/17/2017 5  % Final  . Monocytes Absolute 01/17/2017 0.3  0.1 - 1.0 K/uL Final  . Eosinophils Relative 01/17/2017 2  % Final  . Eosinophils Absolute 01/17/2017 0.1  0.0 - 0.7 K/uL Final  . Basophils Relative 01/17/2017 0  % Final  . Basophils Absolute 01/17/2017 0.0  0.0 - 0.1 K/uL Final  . Prothrombin Time 01/17/2017 13.1  11.4 - 15.2 seconds Final  . INR 01/17/2017 0.99   Final  Hospital Outpatient Visit on 01/16/2017  Component Date Value Ref Range Status  . Glucose-Capillary 01/16/2017 149* 65 - 99 mg/dL Final       [name], MD

## 2017-01-27 NOTE — Telephone Encounter (Signed)
Oral Oncology Patient Advocate Encounter  Received notification from [place] that the prior authorization for Revlimid has been approved.    PA# [id number] Effective dates: 10/26/2016 through 01/24/2020  Her initial copayment for Revlimid is $2.58.   The prescription is being shipped to the patient for expected delivery on 01/28/2017.  Oral Oncology Clinic will continue to follow.   Fabio Asa. Melynda Keller, [place] [facility] [place] [phone] 01/27/2017 8:53 AM

## 2017-01-28 ENCOUNTER — Other Ambulatory Visit: Payer: Self-pay | Admitting: Radiology

## 2017-01-29 ENCOUNTER — Encounter (HOSPITAL_COMMUNITY): Payer: Self-pay

## 2017-01-29 ENCOUNTER — Ambulatory Visit (HOSPITAL_COMMUNITY)
Admission: RE | Admit: 2017-01-29 | Discharge: 2017-01-29 | Disposition: A | Discharge status: Home / Self Care | Payer: Medicare Other | Source: Ambulatory Visit | Attending: Hematology and Oncology | Admitting: Hematology and Oncology

## 2017-01-29 ENCOUNTER — Other Ambulatory Visit: Payer: Self-pay | Admitting: Hematology and Oncology

## 2017-01-29 DIAGNOSIS — Z79899 Other long term (current) drug therapy: Secondary | ICD-10-CM | POA: Diagnosis not present

## 2017-01-29 DIAGNOSIS — G4730 Sleep apnea, unspecified: Secondary | ICD-10-CM | POA: Diagnosis not present

## 2017-01-29 DIAGNOSIS — Z452 Encounter for adjustment and management of vascular access device: Secondary | ICD-10-CM | POA: Diagnosis not present

## 2017-01-29 DIAGNOSIS — I10 Essential (primary) hypertension: Secondary | ICD-10-CM | POA: Diagnosis not present

## 2017-01-29 DIAGNOSIS — I82811 Embolism and thrombosis of superficial veins of right lower extremities: Secondary | ICD-10-CM | POA: Insufficient documentation

## 2017-01-29 DIAGNOSIS — C9000 Multiple myeloma not having achieved remission: Secondary | ICD-10-CM | POA: Insufficient documentation

## 2017-01-29 DIAGNOSIS — E1151 Type 2 diabetes mellitus with diabetic peripheral angiopathy without gangrene: Secondary | ICD-10-CM | POA: Diagnosis not present

## 2017-01-29 DIAGNOSIS — E1140 Type 2 diabetes mellitus with diabetic neuropathy, unspecified: Secondary | ICD-10-CM | POA: Insufficient documentation

## 2017-01-29 DIAGNOSIS — Z794 Long term (current) use of insulin: Secondary | ICD-10-CM | POA: Insufficient documentation

## 2017-01-29 HISTORY — PX: IR FLUORO GUIDE PORT INSERTION RIGHT: IMG5741

## 2017-01-29 HISTORY — PX: IR US GUIDE VASC ACCESS RIGHT: IMG2390

## 2017-01-29 LAB — CBC WITH DIFFERENTIAL/PLATELET
Basophils Absolute: 0 10*3/uL (ref 0.0–0.1)
Basophils Relative: 1 %
EOS ABS: 0.2 10*3/uL (ref 0.0–0.7)
EOS PCT: 2 %
HEMATOCRIT: 34.6 % — AB (ref 36.0–46.0)
HEMOGLOBIN: 12.2 g/dL (ref 12.0–15.0)
LYMPHS ABS: 2.8 10*3/uL (ref 0.7–4.0)
Lymphocytes Relative: 42 %
MCH: 31.4 pg (ref 26.0–34.0)
MCHC: 35.3 g/dL (ref 30.0–36.0)
MCV: 88.9 fL (ref 78.0–100.0)
MONOS PCT: 6 %
Monocytes Absolute: 0.4 10*3/uL (ref 0.1–1.0)
Neutro Abs: 3.4 10*3/uL (ref 1.7–7.7)
Neutrophils Relative %: 49 %
Platelets: 348 10*3/uL (ref 150–400)
RBC: 3.89 MIL/uL (ref 3.87–5.11)
RDW: 13.2 % (ref 11.5–15.5)
WBC: 6.8 10*3/uL (ref 4.0–10.5)

## 2017-01-29 LAB — GLUCOSE, CAPILLARY: Glucose-Capillary: 128 mg/dL — ABNORMAL HIGH (ref 65–99)

## 2017-01-29 MED ORDER — CEFAZOLIN SODIUM-DEXTROSE 2-4 GM/100ML-% IV SOLN: 2.0000 g | INTRAVENOUS | Status: DC

## 2017-01-29 MED ORDER — FENTANYL CITRATE (PF) 100 MCG/2ML IJ SOLN
INTRAMUSCULAR | Status: AC
  Filled 2017-01-29: qty 4

## 2017-01-29 MED ORDER — HEPARIN SOD (PORK) LOCK FLUSH 100 UNIT/ML IV SOLN
INTRAVENOUS | Status: AC | PRN
  Administered 2017-01-29: 500 [IU] via INTRAVENOUS

## 2017-01-29 MED ORDER — CEFAZOLIN SODIUM-DEXTROSE 2-4 GM/100ML-% IV SOLN
INTRAVENOUS | Status: AC
  Administered 2017-01-29: 2000 mg
  Filled 2017-01-29: qty 100

## 2017-01-29 MED ORDER — SODIUM CHLORIDE 0.9 % IV SOLN
INTRAVENOUS | Status: DC
  Administered 2017-01-29: 14:00:00 via INTRAVENOUS

## 2017-01-29 MED ORDER — HEPARIN SOD (PORK) LOCK FLUSH 100 UNIT/ML IV SOLN
INTRAVENOUS | Status: AC
  Filled 2017-01-29: qty 5

## 2017-01-29 MED ORDER — MIDAZOLAM HCL 2 MG/2ML IJ SOLN
INTRAMUSCULAR | Status: AC
  Filled 2017-01-29: qty 4

## 2017-01-29 MED ORDER — FENTANYL CITRATE (PF) 100 MCG/2ML IJ SOLN
INTRAMUSCULAR | Status: AC | PRN
  Administered 2017-01-29 (×2): 50 ug via INTRAVENOUS

## 2017-01-29 MED ORDER — LIDOCAINE-EPINEPHRINE (PF) 2 %-1:200000 IJ SOLN
INTRAMUSCULAR | Status: AC | PRN
  Administered 2017-01-29: 20 mL

## 2017-01-29 MED ORDER — MIDAZOLAM HCL 2 MG/2ML IJ SOLN
INTRAMUSCULAR | Status: AC | PRN
  Administered 2017-01-29 (×4): 1 mg via INTRAVENOUS

## 2017-01-29 MED ORDER — LIDOCAINE-EPINEPHRINE (PF) 2 %-1:200000 IJ SOLN
INTRAMUSCULAR | Status: AC
  Filled 2017-01-29: qty 20

## 2017-01-29 NOTE — Sedation Documentation (Signed)
Patient denies pain and is resting comfortably.  

## 2017-01-29 NOTE — Procedures (Signed)
Myeloma  S/p RT IJ POWER PORT  Tip svcra No comp Stable EBL 0 FULL report in pacs

## 2017-01-29 NOTE — Discharge Instructions (Signed)
Leave bandage on - they will remove it at cancer center tomorrow.   Implanted Port Insertion, Care After This sheet gives you information about how to care for yourself after your procedure. Your health care provider may also give you more specific instructions. If you have problems or questions, contact your health care provider. What can I expect after the procedure? After your procedure, it is common to have:  Discomfort at the port insertion site.  Bruising on the skin over the port. This should improve over 3-4 days.  Follow these instructions at home: [facility] care  After your port is placed, you will get a manufacturer's information card. The card has information about your port. Keep this card with you at all times.  Take care of the port as told by your health care provider. Ask your health care provider if you or a family member can get training for taking care of the port at home. A home health care nurse may also take care of the port.  Make sure to remember what type of port you have. Incision care  Follow instructions from your health care provider about how to take care of your port insertion site. Make sure you: ? Wash your hands with soap and water before you change your bandage (dressing). If soap and water are not available, use hand sanitizer. ? Change your dressing as told by your health care provider. ? Leave stitches (sutures), skin glue, or adhesive strips in place. These skin closures may need to stay in place for 2 weeks or longer. If adhesive strip edges start to loosen and curl up, you may trim the loose edges. Do not remove adhesive strips completely unless your health care provider tells you to do that.  Check your port insertion site every day for signs of infection. Check for: ? More redness, swelling, or pain. ? More fluid or blood. ? Warmth. ? Pus or a bad smell. General instructions  Do not take baths, swim, or use a hot tub until your health care  provider approves.  Do not lift anything that is heavier than 10 lb (4.5 kg) for a week, or as told by your health care provider.  Ask your health care provider when it is okay to: ? Return to work or school. ? Resume usual physical activities or sports.  Do not drive for 24 hours if you were given a medicine to help you relax (sedative).  Take over-the-counter and prescription medicines only as told by your health care provider.  Wear a medical alert bracelet in case of an emergency. This will tell any health care providers that you have a port.  Keep all follow-up visits as told by your health care provider. This is important. Contact a health care provider if:  You cannot flush your port with saline as directed, or you cannot draw blood from the port.  You have a fever or chills.  You have more redness, swelling, or pain around your port insertion site.  You have more fluid or blood coming from your port insertion site.  Your port insertion site feels warm to the touch.  You have pus or a bad smell coming from the port insertion site. Get help right away if:  You have chest pain or shortness of breath.  You have bleeding from your port that you cannot control. Summary  Take care of the port as told by your health care provider.  Change your dressing as told by your health  care provider.  Keep all follow-up visits as told by your health care provider. This information is not intended to replace advice given to you by your health care provider. Make sure you discuss any questions you have with your health care provider. Document Released: 03/24/2013 Document Revised: 04/24/2016 Document Reviewed: 04/24/2016 Elsevier Interactive Patient Education  2017 [place].     Moderate Conscious Sedation, Adult, Care After These instructions provide you with information about caring for yourself after your procedure. Your health care provider may also give you more specific  instructions. Your treatment has been planned according to current medical practices, but problems sometimes occur. Call your health care provider if you have any problems or questions after your procedure. What can I expect after the procedure? After your procedure, it is common:  To feel sleepy for several hours.  To feel clumsy and have poor balance for several hours.  To have poor judgment for several hours.  To vomit if you eat too soon.  Follow these instructions at home: For at least 24 hours after the procedure:   Do not: ? Participate in activities where you could fall or become injured. ? Drive. ? Use heavy machinery. ? Drink alcohol. ? Take sleeping pills or medicines that cause drowsiness. ? Make important decisions or sign legal documents. ? Take care of children on your own.  Rest. Eating and drinking  Follow the diet recommended by your health care provider.  If you vomit: ? Drink water, juice, or soup when you can drink without vomiting. ? Make sure you have little or no nausea before eating solid foods. General instructions  Have a responsible adult stay with you until you are awake and alert.  Take over-the-counter and prescription medicines only as told by your health care provider.  If you smoke, do not smoke without supervision.  Keep all follow-up visits as told by your health care provider. This is important. Contact a health care provider if:  You keep feeling nauseous or you keep vomiting.  You feel light-headed.  You develop a rash.  You have a fever. Get help right away if:  You have trouble breathing. This information is not intended to replace advice given to you by your health care provider. Make sure you discuss any questions you have with your health care provider. Document Released: 03/24/2013 Document Revised: 11/06/2015 Document Reviewed: 09/23/2015 Elsevier Interactive Patient Education  [employer].

## 2017-01-29 NOTE — H&P (Signed)
Referring Physician(s): Perlov,Mikhail G  Supervising Physician: Daryll Brod  Patient Status:  [facility]  Chief Complaint:  "I'm here for a port a cath"  Subjective: Patient familiar to IR service from prior bone marrow biopsy on 01/17/17. She has a history of multiple myeloma and poor venous access and presents today for Port-A-Cath placement for chemotherapy. She currently denies fever, CP,dyspnea, cough, abd/back pain, vomiting or bleeding. She does have occ HA's, nausea, right calf soreness (known right greater saph superficial vein thrombosis).  Past Medical History:  Diagnosis Date  . Arthritis    knees  . Carpal tunnel syndrome   . CHF (congestive heart failure) ([place])   . Diabetes mellitus    type 2  . GERD (gastroesophageal reflux disease)   . Heart murmur    "Little, No concerns" per Dr  Dannielle Burn pt reported.  . Hypertension    controlled using a guided approch with plasma renin activity  . Neuropathy   . Peripheral vascular disease ([place])   . Sleep apnea    Uses CPAP   Past Surgical History:  Procedure Laterality Date  . BIOPSY  11/28/2016   Procedure: BIOPSY;  Surgeon: Rogene Houston, MD;  [facility];  Service: Endoscopy;;  gastric  . [place]  . CERVICAL CONE BIOPSY     cervical lesion  . ESOPHAGOGASTRODUODENOSCOPY (EGD) WITH PROPOFOL N/A 11/28/2016   Procedure: ESOPHAGOGASTRODUODENOSCOPY (EGD) WITH PROPOFOL;  Surgeon: Rogene Houston, MD;  [facility];  Service: Endoscopy;  Laterality: N/A;  9:25  . lipoma removal     right shoulder 2001  . MULTIPLE EXTRACTIONS WITH ALVEOLOPLASTY Bilateral 08/24/2012   Procedure: MULTIPLE EXTRACTION WITH ALVEOLOPLASTY BIOPSY OF RIGHT AND LEFT MANDIBLE ;  Surgeon: Gae Bon, DDS;  Location: [facility];  Service: Oral Surgery;  Laterality: Bilateral;  . POSTERIOR LUMBAR FUSION 4 LEVEL N/A 12/26/2016   Procedure: THORACIC EIGHT TUMOR RESECTION, THORACIC SIX- THORACIC TEN POSTERIOR SPINAL FUSION;   Surgeon: Ashok Pall, MD;  Location: [facility];  Service: Neurosurgery;  Laterality: N/A;  THORACIC 8 TUMOR RESECTION, THORACIC 6- THORACIC 10 POSTERIOR SPINAL FUSION  . ROTATOR CUFF REPAIR     right shoulder  . TUBAL LIGATION       Allergies: No known allergies  Medications: Prior to Admission medications   Medication Sig Start Date End Date Taking? Authorizing Provider  acyclovir (ZOVIRAX) 400 MG tablet Take 1 tablet (400 mg total) by mouth 2 (two) times daily. 01/22/17   [name], MD  amoxicillin (AMOXIL) 500 MG capsule Take 1,000 mg by mouth 2 (two) times daily.    [provider]  chlorthalidone (HYGROTON) 25 MG tablet Take 25 mg by mouth 2 (two) times daily.  11/17/16   [provider]  clarithromycin (BIAXIN) 500 MG tablet Take 500 mg by mouth 2 (two) times daily. 12/12/16   [provider]  dexamethasone (DECADRON) 4 MG tablet Take 10 tablets (40 mg) on days 1, 8, and 15 of chemo. Repeat every 21 days. 01/22/17   [name], MD  dexamethasone (DECADRON) 6 MG tablet Take 6 mg by mouth daily.  12/16/16   [provider]  dicyclomine (BENTYL) 10 MG capsule Take 1 capsule (10 mg total) by mouth 3 (three) times daily as needed for spasms. 11/28/16   Rogene Houston, MD  diltiazem (CARDIZEM CD) 300 MG 24 hr capsule Take 352ms by mouth daily 10/19/16   [provider]  enalapril (VASOTEC) 20 MG tablet Take 20 mg by mouth 2 (two) times daily.     [provider]  insulin degludec (TRESIBA FLEXTOUCH) 100 UNIT/ML SOPN FlexTouch Pen Inject 0.5 mLs (50 Units total) into the skin daily at 10 pm. 11/20/16   Nida, Marella Chimes, MD  Insulin Pen Needle (B-D ULTRAFINE III SHORT PEN) 31G X 8 MM MISC 1 each by Does not apply route as directed. 11/13/16   Cassandria Anger, MD  lenalidomide (REVLIMID) 25 MG capsule Take one capsule (8m) by mouth once daily on days 1-14 every 21 days. Auth# [id number] 01/23/17   PArdath Sax MD    lidocaine-prilocaine (EMLA) cream Apply to affected area once 01/22/17   PArdath Sax MD  LORazepam (ATIVAN) 0.5 MG tablet Take 1 tablet (0.5 mg total) by mouth every 6 (six) hours as needed (Nausea or vomiting). 01/22/17   PArdath Sax MD  metFORMIN (GLUCOPHAGE) 1000 MG tablet Take 1,000 mg by mouth 2 (two) times daily.     [provider]  omeprazole (PRILOSEC) 20 MG capsule Take 20 mg by mouth 2 (two) times daily.    [provider]  ondansetron (ZOFRAN) 8 MG tablet Take 1 tablet (8 mg total) by mouth 2 (two) times daily as needed (Nausea or vomiting). 01/22/17   PArdath Sax MD  oxyCODONE (OXY IR/ROXICODONE) 5 MG immediate release tablet Take 1-2 tablets (5-10 mg total) by mouth every 6 (six) hours as needed for breakthrough pain. 12/30/16   CAshok Pall MD  potassium chloride SA (K-DUR,KLOR-CON) 20 MEQ tablet Take 1 tablet (20 mEq total) by mouth daily. 11/29/16   RRogene Houston MD  prochlorperazine (COMPAZINE) 10 MG tablet Take 1 tablet (10 mg total) by mouth every 6 (six) hours as needed (Nausea or vomiting). 01/22/17   PArdath Sax MD  rivaroxaban (XARELTO) 10 MG TABS tablet Take 1 tablet (10 mg total) by mouth daily. 01/22/17   PArdath Sax MD  tiZANidine (ZANAFLEX) 4 MG tablet Take 1 tablet (4 mg total) by mouth every 6 (six) hours as needed for muscle spasms. 12/30/16   CAshok Pall MD  traMADol (ULTRAM) 50 MG tablet Take 50 mg by mouth 2 (two) times daily as needed for moderate pain.    [provider]     Vital Signs:  Vitals:   01/29/17 1305  BP: (!) 163/95  Pulse: 90  Resp: 18  Temp: 98.1 F (36.7 C)  SpO2: 100%      Physical Exam awake, alert. Chest clear to auscultation bilaterally. Heart with regular rate and rhythm. Abdomen soft, positive bowel sounds, nontender; lower extremities with no significant edema but indurated soft tissue fullness and tenderness noted right medial calf region.  Imaging: No results  found.  Labs:  CBC:  Recent Labs  11/22/16 1253 12/26/16 1054  12/26/16 1609 12/26/16 1720 12/30/16 1547 01/17/17 0924  WBC 5.6 12.5*  --   --   --  10.8* 5.7  HGB 12.2 12.7  < > 11.2* 10.5* 11.2* 11.5*  HCT 35.4* 36.0  < > 33.0* 31.0* 33.1* 32.9*  PLT 270 313  --   --   --  298 419*  < > = values in this interval not displayed.  COAGS:  Recent Labs  01/17/17 0924  INR 0.99    BMP:  Recent Labs  11/22/16 1253 12/16/16 1001  12/26/16 1609 12/26/16 1720 12/30/16 1547 01/17/17 0924  NA 131* 136  < > 137  137 132* 141  K 3.1* 4.0  < > 4.1 4.0 3.6 3.6  CL 94* 103  --   --   --  96* 103  CO2 25 26  --   --   --  28 27  GLUCOSE 324* 154*  < > 170* 137* 187* 152*  BUN 11 10  --   --   --  10 12  CALCIUM 9.5 10.0  --   --   --  9.1 9.9  CREATININE 0.86 0.86  --   --   --  0.96 0.74  GFRNONAA >60  --   --   --   --  >60 >60  GFRAA >60  --   --   --   --  >60 >60  < > = values in this interval not displayed.  LIVER FUNCTION TESTS:  Recent Labs  12/30/16 1547  BILITOT 0.6  AST 21  ALT 22  ALKPHOS 77  PROT 7.4  ALBUMIN 2.9*    Assessment and Plan: Pt with history of multiple myeloma and poor venous access ; presents today for Port-A-Cath placement for chemotherapy.Risks and benefits discussed with the patient/family including, but not limited to bleeding, infection, pneumothorax, or fibrin sheath development and need for additional procedures.All of the patient's questions were answered, patient is agreeable to proceed.Consent signed and in chart.     Electronically Signed: D. Rowe Robert, PA-C 01/29/2017, 1:05 PM   I spent a total of 20 minutes at the the patient's bedside AND on the patient's hospital floor or unit, greater than 50% of which was counseling/coordinating care for Port-A-Cath placement

## 2017-01-29 NOTE — Progress Notes (Signed)
Patient took Metformin this am. Currently CBG is 128. Patient and family made aware if she feels dizzy or lightheaded to let RN know and will recheck CBG.Pre procedure currently for port placement.  Also, patient took Xarelto last night at 1800 and Rowe Robert PA made aware.

## 2017-01-30 ENCOUNTER — Telehealth: Payer: Self-pay | Admitting: Hematology and Oncology

## 2017-01-30 ENCOUNTER — Ambulatory Visit (HOSPITAL_BASED_OUTPATIENT_CLINIC_OR_DEPARTMENT_OTHER): Payer: Medicare Other | Admitting: Hematology and Oncology

## 2017-01-30 ENCOUNTER — Ambulatory Visit: Payer: Medicare Other

## 2017-01-30 ENCOUNTER — Other Ambulatory Visit (HOSPITAL_BASED_OUTPATIENT_CLINIC_OR_DEPARTMENT_OTHER): Payer: Medicare Other

## 2017-01-30 ENCOUNTER — Encounter: Payer: Self-pay | Admitting: Hematology and Oncology

## 2017-01-30 ENCOUNTER — Ambulatory Visit (HOSPITAL_BASED_OUTPATIENT_CLINIC_OR_DEPARTMENT_OTHER): Payer: Medicare Other

## 2017-01-30 VITALS — BP 173/94 | HR 86 | Temp 99.1°F | Resp 18 | Ht 69.0 in | Wt 209.8 lb

## 2017-01-30 DIAGNOSIS — I82819 Embolism and thrombosis of superficial veins of unspecified lower extremities: Secondary | ICD-10-CM

## 2017-01-30 DIAGNOSIS — C9000 Multiple myeloma not having achieved remission: Secondary | ICD-10-CM | POA: Diagnosis not present

## 2017-01-30 DIAGNOSIS — Z5112 Encounter for antineoplastic immunotherapy: Secondary | ICD-10-CM | POA: Diagnosis not present

## 2017-01-30 DIAGNOSIS — Z5111 Encounter for antineoplastic chemotherapy: Secondary | ICD-10-CM

## 2017-01-30 LAB — CBC WITH DIFFERENTIAL/PLATELET
BASO%: 0.5 % (ref 0.0–2.0)
BASOS ABS: 0 10*3/uL (ref 0.0–0.1)
EOS ABS: 0.1 10*3/uL (ref 0.0–0.5)
EOS%: 1.5 % (ref 0.0–7.0)
HCT: 36.1 % (ref 34.8–46.6)
HGB: 12.1 g/dL (ref 11.6–15.9)
LYMPH%: 23.1 % (ref 14.0–49.7)
MCH: 30.8 pg (ref 25.1–34.0)
MCHC: 33.5 g/dL (ref 31.5–36.0)
MCV: 91.9 fL (ref 79.5–101.0)
MONO#: 0.2 10*3/uL (ref 0.1–0.9)
MONO%: 3.9 % (ref 0.0–14.0)
NEUT#: 4.4 10*3/uL (ref 1.5–6.5)
NEUT%: 71 % (ref 38.4–76.8)
Platelets: 323 10*3/uL (ref 145–400)
RBC: 3.93 10*6/uL (ref 3.70–5.45)
RDW: 13.3 % (ref 11.2–14.5)
WBC: 6.2 10*3/uL (ref 3.9–10.3)
lymph#: 1.4 10*3/uL (ref 0.9–3.3)

## 2017-01-30 LAB — COMPREHENSIVE METABOLIC PANEL
ALBUMIN: 3.4 g/dL — AB (ref 3.5–5.0)
ALK PHOS: 84 U/L (ref 40–150)
ALT: 10 U/L (ref 0–55)
AST: 14 U/L (ref 5–34)
Anion Gap: 12 mEq/L — ABNORMAL HIGH (ref 3–11)
BUN: 11.8 mg/dL (ref 7.0–26.0)
CO2: 26 mEq/L (ref 22–29)
Calcium: 10.5 mg/dL — ABNORMAL HIGH (ref 8.4–10.4)
Chloride: 101 mEq/L (ref 98–109)
Creatinine: 1.1 mg/dL (ref 0.6–1.1)
EGFR: 65 mL/min/{1.73_m2} — AB (ref >90)
GLUCOSE: 155 mg/dL — AB (ref 70–140)
POTASSIUM: 3.3 meq/L — AB (ref 3.5–5.1)
SODIUM: 138 meq/L (ref 136–145)
Total Bilirubin: 0.34 mg/dL (ref 0.20–1.20)
Total Protein: 8.7 g/dL — ABNORMAL HIGH (ref 6.4–8.3)

## 2017-01-30 LAB — URIC ACID: Uric Acid, Serum: 7.3 mg/dl (ref 2.6–7.4)

## 2017-01-30 LAB — MAGNESIUM: Magnesium: 2 mg/dl (ref 1.5–2.5)

## 2017-01-30 MED ORDER — OXYCODONE HCL 5 MG PO TABS: 5.0000 mg | ORAL_TABLET | ORAL | 0 refills | Status: DC | PRN

## 2017-01-30 MED ORDER — PROCHLORPERAZINE MALEATE 10 MG PO TABS
ORAL_TABLET | ORAL | Status: AC
  Filled 2017-01-30: qty 1

## 2017-01-30 MED ORDER — ZOLEDRONIC ACID 4 MG/100ML IV SOLN
4.0000 mg | Freq: Once | INTRAVENOUS | Status: AC
  Administered 2017-01-30: 4 mg via INTRAVENOUS
  Filled 2017-01-30: qty 100

## 2017-01-30 MED ORDER — BORTEZOMIB CHEMO SQ INJECTION 3.5 MG (2.5MG/ML)
1.3000 mg/m2 | Freq: Once | INTRAMUSCULAR | Status: AC
  Administered 2017-01-30: 2.75 mg via SUBCUTANEOUS
  Filled 2017-01-30: qty 2.75

## 2017-01-30 MED ORDER — GABAPENTIN 300 MG PO CAPS: 300.0000 mg | ORAL_CAPSULE | Freq: Three times a day (TID) | ORAL | 3 refills | Status: DC

## 2017-01-30 MED ORDER — PROCHLORPERAZINE MALEATE 10 MG PO TABS
10.0000 mg | ORAL_TABLET | Freq: Once | ORAL | Status: AC
  Administered 2017-01-30: 10 mg via ORAL

## 2017-01-30 MED ORDER — HEPARIN SOD (PORK) LOCK FLUSH 100 UNIT/ML IV SOLN
500.0000 [IU] | Freq: Once | INTRAVENOUS | Status: AC | PRN
  Administered 2017-01-30: 500 [IU]
  Filled 2017-01-30: qty 5

## 2017-01-30 MED ORDER — SODIUM CHLORIDE 0.9% FLUSH
10.0000 mL | INTRAVENOUS | Status: DC | PRN
  Administered 2017-01-30: 10 mL
  Filled 2017-01-30: qty 10

## 2017-01-30 NOTE — Telephone Encounter (Signed)
Spoke with patient regarding upcoming appts.   Nurse is printing out updated schedule in infusion.

## 2017-01-30 NOTE — Progress Notes (Signed)
Dr. Lebron Conners okay to treat with BP 173/94. Pt taking antihypertensives. Does not want to order additional medication at this time.

## 2017-01-30 NOTE — Patient Instructions (Signed)
[facility] Discharge Instructions for Patients Receiving Chemotherapy  Today you received the following chemotherapy agents Velcade  To help prevent nausea and vomiting after your treatment, we encourage you to take your nausea medication as directed by your MD.  If you develop nausea and vomiting that is not controlled by your nausea medication, call the clinic.   BELOW ARE SYMPTOMS THAT SHOULD BE REPORTED IMMEDIATELY:  *FEVER GREATER THAN 100.5 F  *CHILLS WITH OR WITHOUT FEVER  NAUSEA AND VOMITING THAT IS NOT CONTROLLED WITH YOUR NAUSEA MEDICATION  *UNUSUAL SHORTNESS OF BREATH  *UNUSUAL BRUISING OR BLEEDING  TENDERNESS IN MOUTH AND THROAT WITH OR WITHOUT PRESENCE OF ULCERS  *URINARY PROBLEMS  *BOWEL PROBLEMS  UNUSUAL RASH Items with * indicate a potential emergency and should be followed up as soon as possible.  Feel free to call the clinic you have any questions or concerns. The clinic phone number is [phone].  Please show the [place] at check-in to the Emergency Department and triage nurse.   Bortezomib injection (VELCADE) What is this medicine? BORTEZOMIB (bor TEZ oh mib) is a medicine that targets proteins in cancer cells and stops the cancer cells from growing. It is used to treat multiple myeloma and mantle-cell lymphoma. This medicine may be used for other purposes; ask your health care provider or pharmacist if you have questions. COMMON BRAND NAME(S): Velcade What should I tell my health care provider before I take this medicine? They need to know if you have any of these conditions: -diabetes -heart disease -irregular heartbeat -liver disease -on hemodialysis -low blood counts, like low white blood cells, platelets, or hemoglobin -peripheral neuropathy -taking medicine for blood pressure -an unusual or allergic reaction to bortezomib, mannitol, boron, other medicines, foods, dyes, or preservatives -pregnant or trying to  get pregnant -breast-feeding How should I use this medicine? This medicine is for injection into a vein or for injection under the skin. It is given by a health care professional in a hospital or clinic setting. Talk to your pediatrician regarding the use of this medicine in children. Special care may be needed. Overdosage: If you think you have taken too much of this medicine contact a poison control center or emergency room at once. NOTE: This medicine is only for you. Do not share this medicine with others. What if I miss a dose? It is important not to miss your dose. Call your doctor or health care professional if you are unable to keep an appointment. What may interact with this medicine? This medicine may interact with the following medications: -ketoconazole -rifampin -ritonavir -St. John's Wort This list may not describe all possible interactions. Give your health care provider a list of all the medicines, herbs, non-prescription drugs, or dietary supplements you use. Also tell them if you smoke, drink alcohol, or use illegal drugs. Some items may interact with your medicine. What should I watch for while using this medicine? You may get drowsy or dizzy. Do not drive, use machinery, or do anything that needs mental alertness until you know how this medicine affects you. Do not stand or sit up quickly, especially if you are an older patient. This reduces the risk of dizzy or fainting spells. In some cases, you may be given additional medicines to help with side effects. Follow all directions for their use. Call your doctor or health care professional for advice if you get a fever, chills or sore throat, or other symptoms of a cold or flu.  Do not treat yourself. This drug decreases your body's ability to fight infections. Try to avoid being around people who are sick. This medicine may increase your risk to bruise or bleed. Call your doctor or health care professional if you notice any  unusual bleeding. You may need blood work done while you are taking this medicine. In some patients, this medicine may cause a serious brain infection that may cause death. If you have any problems seeing, thinking, speaking, walking, or standing, tell your doctor right away. If you cannot reach your doctor, urgently seek other source of medical care. Check with your doctor or health care professional if you get an attack of severe diarrhea, nausea and vomiting, or if you sweat a lot. The loss of too much body fluid can make it dangerous for you to take this medicine. Do not become pregnant while taking this medicine or for at least 2 months after stopping it. Women should inform their doctor if they wish to become pregnant or think they might be pregnant. Men should not father a child while taking this medicine and for at least 2 months after stopping it. There is a potential for serious side effects to an unborn child. Talk to your health care professional or pharmacist for more information. Do not breast-feed an infant while taking this medicine or for 2 months after stopping it. This medicine may interfere with the ability to have a child. You should talk with your doctor or health care professional if you are concerned about your fertility. What side effects may I notice from receiving this medicine? Side effects that you should report to your doctor or health care professional as soon as possible: -allergic reactions like skin rash, itching or hives, swelling of the face, lips, or tongue -breathing problems -changes in hearing -changes in vision -fast, irregular heartbeat -feeling faint or lightheaded, falls -pain, tingling, numbness in the hands or feet -right upper belly pain -seizures -swelling of the ankles, feet, hands -unusual bleeding or bruising -unusually weak or tired -vomiting -yellowing of the eyes or skin Side effects that usually do not require medical attention (report to  your doctor or health care professional if they continue or are bothersome): -changes in emotions or moods -constipation -diarrhea -loss of appetite -headache -irritation at site where injected -nausea This list may not describe all possible side effects. Call your doctor for medical advice about side effects. You may report side effects to FDA at 1-800-FDA-1088. Where should I keep my medicine? This drug is given in a hospital or clinic and will not be stored at home. NOTE: This sheet is a summary. It may not cover all possible information. If you have questions about this medicine, talk to your doctor, pharmacist, or health care provider.  2018 Elsevier/Gold Standard (2016-05-02 15:53:51)    Zoledronic Acid injection (Hypercalcemia, Oncology) What is this medicine? ZOLEDRONIC ACID (ZOE le dron ik AS id) lowers the amount of calcium loss from bone. It is used to treat too much calcium in your blood from cancer. It is also used to prevent complications of cancer that has spread to the bone. This medicine may be used for other purposes; ask your health care provider or pharmacist if you have questions. COMMON BRAND NAME(S): Zometa What should I tell my health care provider before I take this medicine? They need to know if you have any of these conditions: -aspirin-sensitive asthma -cancer, especially if you are receiving medicines used to treat cancer -dental disease or  wear dentures -infection -kidney disease -receiving corticosteroids like dexamethasone or prednisone -an unusual or allergic reaction to zoledronic acid, other medicines, foods, dyes, or preservatives -pregnant or trying to get pregnant -breast-feeding How should I use this medicine? This medicine is for infusion into a vein. It is given by a health care professional in a hospital or clinic setting. Talk to your pediatrician regarding the use of this medicine in children. Special care may be needed. Overdosage: If  you think you have taken too much of this medicine contact a poison control center or emergency room at once. NOTE: This medicine is only for you. Do not share this medicine with others. What if I miss a dose? It is important not to miss your dose. Call your doctor or health care professional if you are unable to keep an appointment. What may interact with this medicine? -certain antibiotics given by injection -NSAIDs, medicines for pain and inflammation, like ibuprofen or naproxen -some diuretics like bumetanide, furosemide -teriparatide -thalidomide This list may not describe all possible interactions. Give your health care provider a list of all the medicines, herbs, non-prescription drugs, or dietary supplements you use. Also tell them if you smoke, drink alcohol, or use illegal drugs. Some items may interact with your medicine. What should I watch for while using this medicine? Visit your doctor or health care professional for regular checkups. It may be some time before you see the benefit from this medicine. Do not stop taking your medicine unless your doctor tells you to. Your doctor may order blood tests or other tests to see how you are doing. Women should inform their doctor if they wish to become pregnant or think they might be pregnant. There is a potential for serious side effects to an unborn child. Talk to your health care professional or pharmacist for more information. You should make sure that you get enough calcium and vitamin D while you are taking this medicine. Discuss the foods you eat and the vitamins you take with your health care professional. Some people who take this medicine have severe bone, joint, and/or muscle pain. This medicine may also increase your risk for jaw problems or a broken thigh bone. Tell your doctor right away if you have severe pain in your jaw, bones, joints, or muscles. Tell your doctor if you have any pain that does not go away or that gets  worse. Tell your dentist and dental surgeon that you are taking this medicine. You should not have major dental surgery while on this medicine. See your dentist to have a dental exam and fix any dental problems before starting this medicine. Take good care of your teeth while on this medicine. Make sure you see your dentist for regular follow-up appointments. What side effects may I notice from receiving this medicine? Side effects that you should report to your doctor or health care professional as soon as possible: -allergic reactions like skin rash, itching or hives, swelling of the face, lips, or tongue -anxiety, confusion, or depression -breathing problems -changes in vision -eye pain -feeling faint or lightheaded, falls -jaw pain, especially after dental work -mouth sores -muscle cramps, stiffness, or weakness -redness, blistering, peeling or loosening of the skin, including inside the mouth -trouble passing urine or change in the amount of urine Side effects that usually do not require medical attention (report to your doctor or health care professional if they continue or are bothersome): -bone, joint, or muscle pain -constipation -diarrhea -fever -hair loss -irritation at  site where injected -loss of appetite -nausea, vomiting -stomach upset -trouble sleeping -trouble swallowing -weak or tired This list may not describe all possible side effects. Call your doctor for medical advice about side effects. You may report side effects to FDA at 1-800-FDA-1088. Where should I keep my medicine? This drug is given in a hospital or clinic and will not be stored at home. NOTE: This sheet is a summary. It may not cover all possible information. If you have questions about this medicine, talk to your doctor, pharmacist, or health care provider.  2018 Elsevier/Gold Standard (2013-10-30 14:19:39)

## 2017-01-31 ENCOUNTER — Telehealth: Payer: Self-pay

## 2017-01-31 LAB — PHOSPHORUS: Phosphorus, Ser: 3.4 mg/dL (ref 2.5–4.5)

## 2017-01-31 NOTE — Telephone Encounter (Signed)
Chemo f/u phone call. Pt feeling okay. Had one incident of vomiting yesterday. Feeling fine today. Told pt to give [place] a call if any other symptoms result.

## 2017-01-31 NOTE — Addendum Note (Signed)
Addended by: [name] on: 01/31/2017 08:10 AM   Modules accepted: Orders

## 2017-01-31 NOTE — Progress Notes (Signed)
[facility] Follow-up Visit:  Assessment: Multiple myeloma not having achieved remission [facility]) 61 year old female who initially presented with significant weight loss in the context of H. pylori-associated gastritis and alcohol abuse. At the same time, an incidental discovery of a lower thoracic vertebral mass in the context of chronic numbness and weakness in the lower extremities. MRI of the thoracic spine demonstrated a destructive lesion at T8 level and patient underwent hematological treatment. 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 Staging evaluation contacted by our clinic confirmed presence of IgG kappa multiple myeloma. At the present time, cytogenetic studies are still pending and we cannot complete the staging assessment of the disease.   Based on the active and symptomatic disease, I do recommend patient to undergo systemic therapy. Today, we have reviewed the diagnosis, extend of disease, recommended treatment options including phases of therapy such as an induction, consolidation, and maintenance, component medications of the lenalidomide, bortezomib, low dose dexamethasone treatment. I have described the medications, mechanism of action, schedule of administration, and potential toxicities of the therapy to the patient. Patient voiced understanding of my explanation and agreed to proceed with the treatment as recommended.  Patient is edentulous and does not need any additional workup prior to initiation of zoledronic acid. Clinical evaluation and lab work today permissive to initiate the treatment as discussed.  Plan: --[place] BMT service for evaluation for possible future consolidation therapy with high-dose chemotherapy and autologous stem cell rescue --Proceed with Cycle #1 of RVd as outlined in the oncological history  zoledronic acid administered every 6 weeks. --Supportive care with Bactrim and acyclovir to reduce  the risk of PCP and VZV infections respectively; at the last visit, patient was started on prophylactic dose of Rivaroxaban (Xarelto) due to superficial venous thrombosis and as a prophylaxis for thrombosis due to use of lenalidomide.  Voice recognition software was used and creation of this note. Despite my best effort at editing the text, some misspelling/errors may have occurred.  Orders Placed This Encounter  Procedures  . CBC with Differential    Standing Status:   Future    Standing Expiration Date:   01/30/2018  . Comprehensive metabolic panel    Standing Status:   Future    Standing Expiration Date:   01/30/2018  . Uric acid    Standing Status:   Future    Standing Expiration Date:   01/30/2018  . SPEP with reflex to IFE    Standing Status:   Future    Standing Expiration Date:   01/30/2018  . Kappa/lambda light chains    Standing Status:   Future    Standing Expiration Date:   01/30/2018    Cancer Staging Multiple myeloma not having achieved remission ([place]) Staging form: Plasma Cell Myeloma and Plasma Cell Disorders, AJCC 8th Edition - Clinical stage from 01/24/2017: Beta-2-microglobulin (mg/L): 2.3, Albumin (g/dL): 2.9, ISS: Stage II, High-risk cytogenetics: Unknown, LDH: Normal - Unsigned   All questions were answered.  . The patient knows to call the clinic with any problems, questions or concerns.  This note was electronically signed.    History of Presenting Illness Caliya Narine is a  61 y.o. female followed in the [place] for diagnosis of active IgG kappa multiple myeloma. Please see oncologic history below for details.  Patient initially presented with epigastric abdominal pain that started in Apr 2018. Patient was previously self medicating with Aleve and Naprosyn or chronic pains. She did not have any melena  or hematochezia. Due to the intensity of the pain, patient underwent EGD on 11/29/16 that demonstrates presence of H. pylori-associated gastritis.  At the time of presentation, family reported weight loss of 60 pounds over 3-4 month period. Previously, patient has been habitually drinking alcohol up to 6 beverages per day, quit drinking mid-May 2018. Continued to have abdominal pain and underwent imaging assessment with CT of the abdomen and pelvis on 12/16/16 did demonstrate presence of a mass lesion with spinal cord compression at level of T8. Compression was relieved surgically. She has been recovering well from surgery.  Patient presents to the clinic after completing her staging evaluation of the multiple myeloma. At last visit to the clinic, patient has complained of painful lumps over the right lower extremity. Ultrasound of the lower extremity demonstrated presence of superficial venous thrombosis, patient was started on prophylactic dose Rivaroxaban (Xarelto) as was planned for her underlying myeloma therapy. At the present time, words feeling reasonably well. Continues to have his musculoskeletal pain that she relieves with oxycodone 5 mg at bedtime only. He is currently not taking tramadol. Patient affirms her desire to proceed with therapy lying multiple myeloma. She reports that she has received all her treatment medications and is ready to proceed if so recommended.no fever or chills, no new complaints.   Oncological/hematological History: --Labs, 11/22/16: Ca 9.5, Cr 0.86; WBC 5.6, Hgb 12.2, Plt 270;  --EGD, 11/29/16: Gastritis positive for H. pylori. Treated with omeprazole, clarithromycin, and amoxicillin x14d  --CT A/P, 12/16/16: Left T8 lytic lesion with soft tissue extension into the spinal canal with cord compression. Fatty liver measuring 19.6cm. 0.3cm lt lower pole renal calculus, no hydronephrosis;  --MRI T-spine, 12/17/16: An enhancing mass on the left side at T8 invading the canal and compressing the spinal cord.   Oncological/hematological History:   Multiple myeloma not having achieved remission ([place])   12/26/2016  Initial Diagnosis    Multiple myeloma not having achieved remission [facility]): Originally presented with a mass resulting in compression of the spinal cord. Underwent surgery on 12/26/16 for decompression. --Initial Surgical Pathology: Plasma cell neoplasm involvement, IHC -- positive for CD138, CD79a, CD56, LCA, CD43, kappa light chains & negative for CD20, lambda light chains; --Baseline labs, 12/30/16: tProt 7.4, Alb 2.9, Ca 9.1, Cr 0.96, AP 77; SPEP/SIFE -- M-Spike 1.0g/dL, monoclonal IgG kappa present; IgA 51, IgG 1477, IgM 17; kappa 77.6, lambda 7.4, KLR 10.35; LDH 161, beta-2 microglobulin 2.3; WBC 10.8, Hgb 11.2, Plt 298;      01/16/2017 Imaging    PET-CT: Positive pathological uptake in T9 and proximal right tibia. No other hypermetabolic disease      4/0/8144 Pathology Results    Bone Marrow Biopsy: Hypercellular bone marrow with 16% involvement by plasma cell process      01/30/2017 -  Chemotherapy    RVd: Lenalidomide '25mg'$  PO QDay, d1-14 + bortezomib 1.'3mg'$ /m2 d1,8,15 + low-dose dexamethasone '40mg'$  PO QWk Q21d --Cycle #1, 01/30/17:        Medical History: Past Medical History:  Diagnosis Date  . Arthritis    knees  . Carpal tunnel syndrome   . CHF (congestive heart failure) ([place])   . Diabetes mellitus    type 2  . GERD (gastroesophageal reflux disease)   . Heart murmur    "Little, No concerns" per Dr  Dannielle Burn pt reported.  . Hypertension    controlled using a guided approch with plasma renin activity  . Neuropathy   . Peripheral vascular disease ([place])   . Sleep  apnea    Uses CPAP    Surgical History: Past Surgical History:  Procedure Laterality Date  . BIOPSY  11/28/2016   Procedure: BIOPSY;  Surgeon: Rogene Houston, MD;  [facility];  Service: Endoscopy;;  gastric  . [place]  . CERVICAL CONE BIOPSY     cervical lesion  . ESOPHAGOGASTRODUODENOSCOPY (EGD) WITH PROPOFOL N/A 11/28/2016   Procedure: ESOPHAGOGASTRODUODENOSCOPY (EGD) WITH PROPOFOL;   Surgeon: Rogene Houston, MD;  [facility];  Service: Endoscopy;  Laterality: N/A;  9:25  . IR FLUORO GUIDE PORT INSERTION RIGHT  01/29/2017  . IR US GUIDE VASC ACCESS RIGHT  01/29/2017  . lipoma removal     right shoulder 2001  . MULTIPLE EXTRACTIONS WITH ALVEOLOPLASTY Bilateral 08/24/2012   Procedure: MULTIPLE EXTRACTION WITH ALVEOLOPLASTY BIOPSY OF RIGHT AND LEFT MANDIBLE ;  Surgeon: Gae Bon, DDS;  Location: [place];  Service: Oral Surgery;  Laterality: Bilateral;  . POSTERIOR LUMBAR FUSION 4 LEVEL N/A 12/26/2016   Procedure: THORACIC EIGHT TUMOR RESECTION, THORACIC SIX- THORACIC TEN POSTERIOR SPINAL FUSION;  Surgeon: Ashok Pall, MD;  Location: [place];  Service: Neurosurgery;  Laterality: N/A;  THORACIC 8 TUMOR RESECTION, THORACIC 6- THORACIC 10 POSTERIOR SPINAL FUSION  . ROTATOR CUFF REPAIR     right shoulder  . TUBAL LIGATION      Family History: Family History  Problem Relation Age of Onset  . Diabetes Mother   . Hypertension Mother   . Heart disease Mother   . Diabetes Father   . Heart disease Sister   . Diabetes Sister     Social History: Social History   Social History  . Marital status: Single    Spouse name: N/A  . Number of children: N/A  . Years of education: N/A   Occupational History  . Not on file.   Social History Main Topics  . Smoking status: Former Smoker    Packs/day: 0.50    Years: 6.00    Types: Cigarettes    Quit date: 11/23/2011  . Smokeless tobacco: Never Used     Comment: quit summer 2013  . Alcohol use No  . Drug use: No  . Sexual activity: Not Currently    Birth control/ protection: Post-menopausal   Other Topics Concern  . Not on file   Social History Narrative  . No narrative on file    Allergies: Allergies  Allergen Reactions  . No Known Allergies     Medications:  Current Outpatient Prescriptions  Medication Sig Dispense Refill  . acyclovir (ZOVIRAX) 400 MG tablet Take 1 tablet (400 mg total) by mouth 2  (two) times daily. 60 tablet 3  . chlorthalidone (HYGROTON) 25 MG tablet Take 25 mg by mouth 2 (two) times daily.   11  . dexamethasone (DECADRON) 4 MG tablet Take 10 tablets (40 mg) on days 1, 8, and 15 of chemo. Repeat every 21 days. 30 tablet 3  . diltiazem (CARDIZEM CD) 300 MG 24 hr capsule Take '300mg'$ s by mouth daily  3  . enalapril (VASOTEC) 20 MG tablet Take 20 mg by mouth 2 (two) times daily.     Marland Kitchen gabapentin (NEURONTIN) 300 MG capsule Take 1 capsule (300 mg total) by mouth 3 (three) times daily. 63 capsule 3  . insulin degludec (TRESIBA FLEXTOUCH) 100 UNIT/ML SOPN FlexTouch Pen Inject 0.5 mLs (50 Units total) into the skin daily at 10 pm. 5 pen 2  . Insulin Pen Needle (B-D  ULTRAFINE III SHORT PEN) 31G X 8 MM MISC 1 each by Does not apply route as directed. 100 each 3  . lenalidomide (REVLIMID) 25 MG capsule Take one capsule (3m) by mouth once daily on days 1-14 every 21 days. Auth# [id number] 14 capsule 0  . lidocaine-prilocaine (EMLA) cream Apply to affected area once 30 g 3  . LORazepam (ATIVAN) 0.5 MG tablet Take 1 tablet (0.5 mg total) by mouth every 6 (six) hours as needed (Nausea or vomiting). 30 tablet 0  . metFORMIN (GLUCOPHAGE) 1000 MG tablet Take 1,000 mg by mouth 2 (two) times daily.     . ondansetron (ZOFRAN) 8 MG tablet Take 1 tablet (8 mg total) by mouth 2 (two) times daily as needed (Nausea or vomiting). (Patient not taking: Reported on 01/30/2017) 30 tablet 1  . oxyCODONE (OXY IR/ROXICODONE) 5 MG immediate release tablet Take 1 tablet (5 mg total) by mouth every 4 (four) hours as needed for severe pain. 30 tablet 0  . potassium chloride SA (K-DUR,KLOR-CON) 20 MEQ tablet Take 1 tablet (20 mEq total) by mouth daily. 30 tablet 2  . prochlorperazine (COMPAZINE) 10 MG tablet Take 1 tablet (10 mg total) by mouth every 6 (six) hours as needed (Nausea or vomiting). 30 tablet 1  . rivaroxaban (XARELTO) 10 MG TABS tablet Take 1 tablet (10 mg total) by mouth daily. 30 tablet 3  .  tiZANidine (ZANAFLEX) 4 MG tablet Take 1 tablet (4 mg total) by mouth every 6 (six) hours as needed for muscle spasms. 60 tablet 0   No current facility-administered medications for this visit.     Review of Systems: Review of Systems  Musculoskeletal: Positive for back pain and gait problem.  Neurological: Positive for extremity weakness, gait problem and numbness.  All other systems reviewed and are negative.    PHYSICAL EXAMINATION Blood pressure (!) 173/94, pulse 86, temperature 99.1 F (37.3 C), temperature source Oral, resp. rate 18, height _0  (1.753 m), weight 209 lb 12.8 oz (95.2 kg), SpO2 100 %.  ECOG PERFORMANCE STATUS: 2 - Symptomatic, <50% confined to bed  Physical Exam  Constitutional: She is oriented to person, place, and time and well-developed, well-nourished, and in no distress.  HENT:  Head: Normocephalic and atraumatic.  Mouth/Throat: Oropharynx is clear and moist. No oropharyngeal exudate.  Eyes: Pupils are equal, round, and reactive to light. EOM are normal. No scleral icterus.  Neck: No thyromegaly present.  Cardiovascular: Normal rate, regular rhythm and normal heart sounds.  Exam reveals no gallop.   No murmur heard. Pulmonary/Chest: Effort normal and breath sounds normal. She has no wheezes.  Abdominal: Soft. Bowel sounds are normal. She exhibits no distension and no mass. There is no tenderness. There is no rebound.  Musculoskeletal: Normal range of motion. She exhibits no edema.  Patient has significant tenderness to palpation over the mid to lower thoracic spine consistent with recent surgery there.  New development of tender spots over the right medial leg, the largest one over the proximal right medial leg associated with darkening of the skin, hyperthermia, and severe tenderness.  Neurological: She is alert and oriented to person, place, and time.  Patient ambulates with difficulty and uses a walker.     LABORATORY DATA: I have personally  reviewed the data as listed: Appointment on 01/30/2017  Component Date Value Ref Range Status  . WBC 01/30/2017 6.2  3.9 - 10.3 10e3/uL Final  . NEUT# 01/30/2017 4.4  1.5 - 6.5 10e3/uL Final  .  HGB 01/30/2017 12.1  11.6 - 15.9 g/dL Final  . HCT 01/30/2017 36.1  34.8 - 46.6 % Final  . Platelets 01/30/2017 323  145 - 400 10e3/uL Final  . MCV 01/30/2017 91.9  79.5 - 101.0 fL Final  . MCH 01/30/2017 30.8  25.1 - 34.0 pg Final  . MCHC 01/30/2017 33.5  31.5 - 36.0 g/dL Final  . RBC 01/30/2017 3.93  3.70 - 5.45 10e6/uL Final  . RDW 01/30/2017 13.3  11.2 - 14.5 % Final  . lymph# 01/30/2017 1.4  0.9 - 3.3 10e3/uL Final  . MONO# 01/30/2017 0.2  0.1 - 0.9 10e3/uL Final  . Eosinophils Absolute 01/30/2017 0.1  0.0 - 0.5 10e3/uL Final  . Basophils Absolute 01/30/2017 0.0  0.0 - 0.1 10e3/uL Final  . NEUT% 01/30/2017 71.0  38.4 - 76.8 % Final  . LYMPH% 01/30/2017 23.1  14.0 - 49.7 % Final  . MONO% 01/30/2017 3.9  0.0 - 14.0 % Final  . EOS% 01/30/2017 1.5  0.0 - 7.0 % Final  . BASO% 01/30/2017 0.5  0.0 - 2.0 % Final  . Sodium 01/30/2017 138  136 - 145 mEq/L Final  . Potassium 01/30/2017 3.3* 3.5 - 5.1 mEq/L Final  . Chloride 01/30/2017 101  98 - 109 mEq/L Final  . CO2 01/30/2017 26  22 - 29 mEq/L Final  . Glucose 01/30/2017 155* 70 - 140 mg/dl Final   Glucose reference range is for nonfasting patients. Fasting glucose reference range is 70- 100.  Marland Kitchen BUN 01/30/2017 11.8  7.0 - 26.0 mg/dL Final  . Creatinine 01/30/2017 1.1  0.6 - 1.1 mg/dL Final  . Total Bilirubin 01/30/2017 0.34  0.20 - 1.20 mg/dL Final  . Alkaline Phosphatase 01/30/2017 84  40 - 150 U/L Final  . AST 01/30/2017 14  5 - 34 U/L Final  . ALT 01/30/2017 10  0 - 55 U/L Final  . Total Protein 01/30/2017 8.7* 6.4 - 8.3 g/dL Final  . Albumin 01/30/2017 3.4* 3.5 - 5.0 g/dL Final  . Calcium 01/30/2017 10.5* 8.4 - 10.4 mg/dL Final  . Anion Gap 01/30/2017 12* 3 - 11 mEq/L Final  . EGFR 01/30/2017 65* >90 ml/min/1.73 m2 Final   eGFR is  calculated using the CKD-EPI Creatinine Equation (2009)  . Uric Acid, Serum 01/30/2017 7.3  2.6 - 7.4 mg/dl Final  . Magnesium 01/30/2017 2.0  1.5 - 2.5 mg/dl Final  . Phosphorus, Ser 01/30/2017 3.4  2.5 - 4.5 mg/dL Final  Hospital Outpatient Visit on 01/29/2017  Component Date Value Ref Range Status  . Glucose-Capillary 01/29/2017 128* 65 - 99 mg/dL Final  . Comment 1 01/29/2017 Notify RN   Final  . Comment 2 01/29/2017 Document in Chart   Final  Hospital Outpatient Visit on 01/29/2017  Component Date Value Ref Range Status  . WBC 01/29/2017 6.8  4.0 - 10.5 K/uL Final  . RBC 01/29/2017 3.89  3.87 - 5.11 MIL/uL Final  . Hemoglobin 01/29/2017 12.2  12.0 - 15.0 g/dL Final  . HCT 01/29/2017 34.6* 36.0 - 46.0 % Final  . MCV 01/29/2017 88.9  78.0 - 100.0 fL Final  . MCH 01/29/2017 31.4  26.0 - 34.0 pg Final  . MCHC 01/29/2017 35.3  30.0 - 36.0 g/dL Final  . RDW 01/29/2017 13.2  11.5 - 15.5 % Final  . Platelets 01/29/2017 348  150 - 400 K/uL Final  . Neutrophils Relative % 01/29/2017 49  % Final  . Neutro Abs 01/29/2017 3.4  1.7 - 7.7 K/uL Final  .  Lymphocytes Relative 01/29/2017 42  % Final  . Lymphs Abs 01/29/2017 2.8  0.7 - 4.0 K/uL Final  . Monocytes Relative 01/29/2017 6  % Final  . Monocytes Absolute 01/29/2017 0.4  0.1 - 1.0 K/uL Final  . Eosinophils Relative 01/29/2017 2  % Final  . Eosinophils Absolute 01/29/2017 0.2  0.0 - 0.7 K/uL Final  . Basophils Relative 01/29/2017 1  % Final  . Basophils Absolute 01/29/2017 0.0  0.0 - 0.1 K/uL Final       [name], MD

## 2017-01-31 NOTE — Assessment & Plan Note (Addendum)
61 year old female who initially presented with significant weight loss in the context of H. pylori-associated gastritis and alcohol abuse. At the same time, an incidental discovery of a lower thoracic vertebral mass in the context of chronic numbness and weakness in the lower extremities. MRI of the thoracic spine demonstrated a destructive lesion at T8 level and patient underwent hematological treatment. 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 Staging evaluation contacted by our clinic confirmed presence of IgG kappa multiple myeloma. At the present time, cytogenetic studies are still pending and we cannot complete the staging assessment of the disease.   Based on the active and symptomatic disease, I do recommend patient to undergo systemic therapy. Today, we have reviewed the diagnosis, extend of disease, recommended treatment options including phases of therapy such as an induction, consolidation, and maintenance, component medications of the lenalidomide, bortezomib, low dose dexamethasone treatment. I have described the medications, mechanism of action, schedule of administration, and potential toxicities of the therapy to the patient. Patient voiced understanding of my explanation and agreed to proceed with the treatment as recommended.  Patient is edentulous and does not need any additional workup prior to initiation of zoledronic acid. Clinical evaluation and lab work today permissive to initiate the treatment as discussed.  Plan: --[place] BMT service for evaluation for possible future consolidation therapy with high-dose chemotherapy and autologous stem cell rescue --Proceed with Cycle #1 of RVd as outlined in the oncological history  zoledronic acid administered every 6 weeks. --Supportive care with Bactrim and acyclovir to reduce the risk of PCP and VZV infections respectively; at the last visit, patient was started on prophylactic dose of  Rivaroxaban (Xarelto) due to superficial venous thrombosis and as a prophylaxis for thrombosis due to use of lenalidomide.

## 2017-02-05 ENCOUNTER — Ambulatory Visit (HOSPITAL_BASED_OUTPATIENT_CLINIC_OR_DEPARTMENT_OTHER): Payer: Medicare Other

## 2017-02-05 VITALS — BP 137/82 | HR 92 | Temp 99.0°F | Resp 18

## 2017-02-05 DIAGNOSIS — C9000 Multiple myeloma not having achieved remission: Secondary | ICD-10-CM

## 2017-02-05 DIAGNOSIS — Z5112 Encounter for antineoplastic immunotherapy: Secondary | ICD-10-CM

## 2017-02-05 MED ORDER — PROCHLORPERAZINE MALEATE 10 MG PO TABS
ORAL_TABLET | ORAL | Status: AC
  Filled 2017-02-05: qty 1

## 2017-02-05 MED ORDER — PROCHLORPERAZINE MALEATE 10 MG PO TABS
10.0000 mg | ORAL_TABLET | Freq: Once | ORAL | Status: AC
  Administered 2017-02-05: 10 mg via ORAL

## 2017-02-05 MED ORDER — BORTEZOMIB CHEMO SQ INJECTION 3.5 MG (2.5MG/ML)
1.3000 mg/m2 | Freq: Once | INTRAMUSCULAR | Status: AC
  Administered 2017-02-05: 2.75 mg via SUBCUTANEOUS
  Filled 2017-02-05: qty 2.75

## 2017-02-05 NOTE — Progress Notes (Signed)
Dr. Alen Blew okay to tx with K 3.3.

## 2017-02-05 NOTE — Patient Instructions (Signed)
[facility] Discharge Instructions for Patients Receiving Chemotherapy  Today you received the following chemotherapy agents bortezomib (Velcade)  To help prevent nausea and vomiting after your treatment, we encourage you to take your nausea medication as directed by your MD.  If you develop nausea and vomiting that is not controlled by your nausea medication, call the clinic.   BELOW ARE SYMPTOMS THAT SHOULD BE REPORTED IMMEDIATELY:  *FEVER GREATER THAN 100.5 F  *CHILLS WITH OR WITHOUT FEVER  NAUSEA AND VOMITING THAT IS NOT CONTROLLED WITH YOUR NAUSEA MEDICATION  *UNUSUAL SHORTNESS OF BREATH  *UNUSUAL BRUISING OR BLEEDING  TENDERNESS IN MOUTH AND THROAT WITH OR WITHOUT PRESENCE OF ULCERS  *URINARY PROBLEMS  *BOWEL PROBLEMS  UNUSUAL RASH Items with * indicate a potential emergency and should be followed up as soon as possible.  Feel free to call the clinic you have any questions or concerns. The clinic phone number is [phone].  Please show the [facility] at check-in to the Emergency Department and triage nurse.

## 2017-02-10 ENCOUNTER — Encounter (HOSPITAL_COMMUNITY): Payer: Self-pay

## 2017-02-11 ENCOUNTER — Telehealth: Payer: Self-pay | Admitting: Hematology and Oncology

## 2017-02-11 LAB — TISSUE HYBRIDIZATION (BONE MARROW)-NCBH

## 2017-02-11 LAB — CHROMOSOME ANALYSIS, BONE MARROW

## 2017-02-11 NOTE — Telephone Encounter (Signed)
Pt appt @ [facility] is 03/26/17 @7 :45. Pt is aware.

## 2017-02-11 NOTE — Telephone Encounter (Signed)
Faxed records to the [facility]. The nurse navigator will review the records and if the insurance is approved, jennifer will call pt with appt.

## 2017-02-18 ENCOUNTER — Other Ambulatory Visit: Payer: Self-pay

## 2017-02-18 DIAGNOSIS — C9000 Multiple myeloma not having achieved remission: Secondary | ICD-10-CM

## 2017-02-18 MED ORDER — LENALIDOMIDE 25 MG PO CAPS: ORAL_CAPSULE | ORAL | 0 refills | Status: DC

## 2017-02-20 ENCOUNTER — Ambulatory Visit: Payer: Medicare Other

## 2017-02-20 ENCOUNTER — Ambulatory Visit (HOSPITAL_BASED_OUTPATIENT_CLINIC_OR_DEPARTMENT_OTHER): Payer: Medicare Other

## 2017-02-20 ENCOUNTER — Ambulatory Visit (HOSPITAL_BASED_OUTPATIENT_CLINIC_OR_DEPARTMENT_OTHER): Payer: Medicare Other | Admitting: Hematology and Oncology

## 2017-02-20 ENCOUNTER — Other Ambulatory Visit (HOSPITAL_BASED_OUTPATIENT_CLINIC_OR_DEPARTMENT_OTHER): Payer: Medicare Other

## 2017-02-20 VITALS — BP 104/59 | HR 87 | Temp 98.9°F | Resp 18 | Ht 69.0 in | Wt 209.4 lb

## 2017-02-20 DIAGNOSIS — C9000 Multiple myeloma not having achieved remission: Secondary | ICD-10-CM

## 2017-02-20 DIAGNOSIS — Z5112 Encounter for antineoplastic immunotherapy: Secondary | ICD-10-CM

## 2017-02-20 DIAGNOSIS — Z5111 Encounter for antineoplastic chemotherapy: Secondary | ICD-10-CM

## 2017-02-20 LAB — COMPREHENSIVE METABOLIC PANEL
ALBUMIN: 3.1 g/dL — AB (ref 3.5–5.0)
ALK PHOS: 93 U/L (ref 40–150)
ALT: 8 U/L (ref 0–55)
AST: 11 U/L (ref 5–34)
Anion Gap: 11 mEq/L (ref 3–11)
BILIRUBIN TOTAL: 0.28 mg/dL (ref 0.20–1.20)
BUN: 11.3 mg/dL (ref 7.0–26.0)
CALCIUM: 10 mg/dL (ref 8.4–10.4)
CO2: 25 mEq/L (ref 22–29)
CREATININE: 1 mg/dL (ref 0.6–1.1)
Chloride: 100 mEq/L (ref 98–109)
EGFR: 70 mL/min/{1.73_m2} — ABNORMAL LOW (ref >90)
Glucose: 236 mg/dl — ABNORMAL HIGH (ref 70–140)
Potassium: 3.7 mEq/L (ref 3.5–5.1)
Sodium: 136 mEq/L (ref 136–145)
Total Protein: 7.6 g/dL (ref 6.4–8.3)

## 2017-02-20 LAB — CBC WITH DIFFERENTIAL/PLATELET
BASO%: 0.2 % (ref 0.0–2.0)
BASOS ABS: 0 10*3/uL (ref 0.0–0.1)
EOS%: 1.3 % (ref 0.0–7.0)
Eosinophils Absolute: 0.1 10*3/uL (ref 0.0–0.5)
HEMATOCRIT: 30.7 % — AB (ref 34.8–46.6)
HEMOGLOBIN: 10.6 g/dL — AB (ref 11.6–15.9)
LYMPH#: 1.1 10*3/uL (ref 0.9–3.3)
LYMPH%: 21.9 % (ref 14.0–49.7)
MCH: 31.1 pg (ref 25.1–34.0)
MCHC: 34.4 g/dL (ref 31.5–36.0)
MCV: 90.3 fL (ref 79.5–101.0)
MONO#: 0.3 10*3/uL (ref 0.1–0.9)
MONO%: 6.5 % (ref 0.0–14.0)
NEUT#: 3.5 10*3/uL (ref 1.5–6.5)
NEUT%: 70.1 % (ref 38.4–76.8)
PLATELETS: 512 10*3/uL — AB (ref 145–400)
RBC: 3.41 10*6/uL — ABNORMAL LOW (ref 3.70–5.45)
RDW: 14.2 % (ref 11.2–14.5)
WBC: 4.9 10*3/uL (ref 3.9–10.3)

## 2017-02-20 LAB — URIC ACID: URIC ACID, SERUM: 6.5 mg/dL (ref 2.6–7.4)

## 2017-02-20 MED ORDER — HEPARIN SOD (PORK) LOCK FLUSH 100 UNIT/ML IV SOLN
500.0000 [IU] | Freq: Once | INTRAVENOUS | Status: AC | PRN
  Administered 2017-02-20: 500 [IU]
  Filled 2017-02-20: qty 5

## 2017-02-20 MED ORDER — SODIUM CHLORIDE 0.9% FLUSH
10.0000 mL | INTRAVENOUS | Status: DC | PRN
Start: 2017-02-20 — End: 2017-02-20
  Administered 2017-02-20: 10 mL
  Filled 2017-02-20: qty 10

## 2017-02-20 MED ORDER — BORTEZOMIB CHEMO SQ INJECTION 3.5 MG (2.5MG/ML)
1.3000 mg/m2 | Freq: Once | INTRAMUSCULAR | Status: AC
  Administered 2017-02-20: 2.75 mg via SUBCUTANEOUS
  Filled 2017-02-20: qty 2.75

## 2017-02-20 MED ORDER — PROCHLORPERAZINE MALEATE 10 MG PO TABS
10.0000 mg | ORAL_TABLET | Freq: Once | ORAL | Status: AC
  Administered 2017-02-20: 10 mg via ORAL

## 2017-02-20 MED ORDER — SODIUM CHLORIDE 0.9% FLUSH
10.0000 mL | INTRAVENOUS | Status: DC | PRN
  Administered 2017-02-20: 10 mL
  Filled 2017-02-20: qty 10

## 2017-02-20 MED ORDER — PROCHLORPERAZINE MALEATE 10 MG PO TABS
ORAL_TABLET | ORAL | Status: AC
  Filled 2017-02-20: qty 1

## 2017-02-20 MED ORDER — ZOLEDRONIC ACID 4 MG/100ML IV SOLN: 4.0000 mg | Freq: Once | INTRAVENOUS | Status: DC

## 2017-02-20 MED ORDER — SODIUM CHLORIDE 0.9 % IV SOLN: Freq: Once | INTRAVENOUS | Status: DC

## 2017-02-20 NOTE — Patient Instructions (Signed)
[facility] Discharge Instructions for Patients Receiving Chemotherapy  Today you received the following chemotherapy agents bortezomib (Velcade)  To help prevent nausea and vomiting after your treatment, we encourage you to take your nausea medication as directed by your MD.  If you develop nausea and vomiting that is not controlled by your nausea medication, call the clinic.   BELOW ARE SYMPTOMS THAT SHOULD BE REPORTED IMMEDIATELY:  *FEVER GREATER THAN 100.5 F  *CHILLS WITH OR WITHOUT FEVER  NAUSEA AND VOMITING THAT IS NOT CONTROLLED WITH YOUR NAUSEA MEDICATION  *UNUSUAL SHORTNESS OF BREATH  *UNUSUAL BRUISING OR BLEEDING  TENDERNESS IN MOUTH AND THROAT WITH OR WITHOUT PRESENCE OF ULCERS  *URINARY PROBLEMS  *BOWEL PROBLEMS  UNUSUAL RASH Items with * indicate a potential emergency and should be followed up as soon as possible.  Feel free to call the clinic you have any questions or concerns. The clinic phone number is [phone].  Please show the [facility] at check-in to the Emergency Department and triage nurse.

## 2017-02-20 NOTE — Progress Notes (Signed)
Pt treatment plan reviewed with MD, pt will receive Velcade weekly, Zometa q 6 weeks per DR Lebron Conners. Instructed pt to return to scheduling for future appt/schedules.

## 2017-02-21 ENCOUNTER — Telehealth: Payer: Self-pay | Admitting: Hematology and Oncology

## 2017-02-21 LAB — KAPPA/LAMBDA LIGHT CHAINS
IG KAPPA FREE LIGHT CHAIN: 75.8 mg/L — AB (ref 3.3–19.4)
Ig Lambda Free Light Chain: 15.6 mg/L (ref 5.7–26.3)
Kappa/Lambda FluidC Ratio: 4.86 — ABNORMAL HIGH (ref 0.26–1.65)

## 2017-02-21 NOTE — Progress Notes (Signed)
[place] [facility] Follow-up Visit:  Assessment: Multiple myeloma not having achieved remission [facility]) 61 year old female who initially presented with significant weight loss in the context of H. pylori-associated gastritis and alcohol abuse. At the same time, an incidental discovery of a lower thoracic vertebral mass in the context of chronic numbness and weakness in the lower extremities. MRI of the thoracic spine demonstrated a destructive lesion at T8 level and patient underwent hematological treatment. 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 Staging evaluation contacted by our clinic confirmed presence of IgG kappa multiple myeloma. 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 Based on initial information, patient is now staged at [facility].   Patient was initiated on induction systemic chemotherapy with lenalidomide, bortezomib, and low-dose dexamethasone 3 weeks ago. She appears to have tolerated the initial cycle of treatment without difficulties. Clinical evaluation and lab work today permissive to proceed with a second cycle of scheduled therapy without alteration to the ptosis of the medications. Patient has grade 1 nausea, partially attributable to insufficient utilization of prescribed antiemetics. Appropriate use of prescribed antiemetics has been reviewed with the patient in the clinic today.  Patient is now scheduled to see bone marrow transplant service at [facility] on 03/26/17.  Plan: --Proceed with Cycle #2 of RVd as outlined in the oncological history with zoledronic acid administered every 6 weeks (will receive with the next cycle). --Supportive care with Bactrim and acyclovir to reduce the risk of PCP and VZV infections respectively; at the last visit, patient was started on prophylactic dose of Rivaroxaban (Xarelto) due to superficial venous thrombosis and as a  prophylaxis for thrombosis due to use of lenalidomide. --Patient will return to our clinic in 3 weeks with lab work for possible consideration of the third cycle of therapy. Subsequent to the third cycle of therapy, patient will be seen at the [facility] and will likely undergo restaging bone marrow biopsy there.  Voice recognition software was used and creation of this note. Despite my best effort at editing the text, some misspelling/errors may have occurred.  Orders Placed This Encounter  Procedures  . CBC with Differential    Standing Status:   Future    Standing Expiration Date:   02/21/2018  . Comprehensive metabolic panel    Standing Status:   Future    Standing Expiration Date:   02/21/2018  . Lactate dehydrogenase (LDH)    Standing Status:   Future    Standing Expiration Date:   02/21/2018  . Kappa/lambda light chains    Standing Status:   Future    Standing Expiration Date:   02/21/2018  . SPEP (Serum protein electrophoresis)    Standing Status:   Future    Standing Expiration Date:   02/21/2018  . QIG  (Quant. immunoglobulins  - IgG, IgA, IgM)    Standing Status:   Future    Standing Expiration Date:   02/21/2018    Cancer Staging Multiple myeloma not having achieved remission ([place]) Staging form: Plasma Cell Myeloma and Plasma Cell Disorders, AJCC 8th Edition - Clinical stage from 01/24/2017: RISS Stage II (Beta-2-microglobulin (mg/L): 2.3, Albumin (g/dL): 2.9, ISS: Stage II, High-risk cytogenetics: Absent, LDH: Normal) - Signed by [name], MD on 02/21/2017   All questions were answered.  . The patient knows to call the clinic with any problems, questions or concerns.  This note was electronically signed.  History of Presenting Illness Zoii Florer is a  61 y.o. female followed in the [place] for diagnosis of active IgG kappa multiple myeloma. Please see oncologic history below for details.  Patient initially presented with epigastric abdominal  pain that started in Apr 2018. Patient was previously self medicating with Aleve and Naprosyn or chronic pains. She did not have any melena or hematochezia. Due to the intensity of the pain, patient underwent EGD on 11/29/16 that demonstrates presence of H. pylori-associated gastritis. At the time of presentation, family reported weight loss of 60 pounds over 3-4 month period. Previously, patient has been habitually drinking alcohol up to 6 beverages per day, quit drinking mid-May 2018. Continued to have abdominal pain and underwent imaging assessment with CT of the abdomen and pelvis on 12/16/16 did demonstrate presence of a mass lesion with spinal cord compression at level of T8. Compression was relieved surgically. She has been recovering well from surgery.  Patient presents to the clinic to consider initiation of the second cycle of systemic induction chemotherapy with a mild, bortezomib, and low-dose dexamethasone. Since last visit to the clinic, patient reports experiencing nearly daily nausea which starts early in the morning. She does express significant confusion regarding the use of her current antiemetics. It appears that she has not been using Compazine at all and only has been taking Zofran and lorazepam. Patient denies any interval fevers, chills or night sweats. No new shortness of breath, chest pain, or cough. Denies dysuria or hematuria. No diarrhea. Patient denies any progression of chronic neuropathy in the lower extremities.  Oncological/hematological History: --Labs, 11/22/16: Ca 9.5, Cr 0.86; WBC 5.6, Hgb 12.2, Plt 270;  --EGD, 11/29/16: Gastritis positive for H. pylori. Treated with omeprazole, clarithromycin, and amoxicillin x14d  --CT A/P, 12/16/16: Left T8 lytic lesion with soft tissue extension into the spinal canal with cord compression. Fatty liver measuring 19.6cm. 0.3cm lt lower pole renal calculus, no hydronephrosis;  --MRI T-spine, 12/17/16: An enhancing mass on the left  side at T8 invading the canal and compressing the spinal cord.   Oncological/hematological History:   Multiple myeloma not having achieved remission ([place])   12/26/2016 Initial Diagnosis    Multiple myeloma not having achieved remission [facility]): Originally presented with a mass resulting in compression of the spinal cord. Underwent surgery on 12/26/16 for decompression. --Initial Surgical Pathology: Plasma cell neoplasm involvement, IHC -- positive for CD138, CD79a, CD56, LCA, CD43, kappa light chains & negative for CD20, lambda light chains; --Baseline labs, 12/30/16: tProt 7.4, Alb 2.9, Ca 9.1, Cr 0.96, AP 77; SPEP/SIFE -- M-Spike 1.0g/dL, monoclonal IgG kappa present; IgA 51, IgG 1477, IgM 17; kappa 77.6, lambda 7.4, KLR 10.35; LDH 161, beta-2 microglobulin 2.3; WBC 10.8, Hgb 11.2, Plt 298;      01/16/2017 Imaging    PET-CT: Positive pathological uptake in T9 and proximal right tibia. No other hypermetabolic disease      11/18/4032 Pathology Results    Bone Marrow Biopsy: Hypercellular bone marrow with 16% involvement by plasma cell process FISH: positive for [id number] loss, 13q34 loss, negative for del p53      01/30/2017 -  Chemotherapy    RVd: Lenalidomide 13m PO QDay, d1-14 + bortezomib 1.330mm2 d1,8,15 + low-dose dexamethasone 4036mO QWk Q21d --Cycle #1, 01/30/17: Complete located by grade 1 nausea --Cycle #2, 02/20/17:        Medical History: Past Medical History:  Diagnosis Date  . Arthritis    knees  . Carpal tunnel syndrome   . CHF (  congestive heart failure) ([place])   . Diabetes mellitus    type 2  . GERD (gastroesophageal reflux disease)   . Heart murmur    "Little, No concerns" per Dr  Dannielle Burn pt reported.  . Hypertension    controlled using a guided approch with plasma renin activity  . Neuropathy   . Peripheral vascular disease ([place])   . Sleep apnea    Uses CPAP    Surgical History: Past Surgical History:  Procedure Laterality Date  . BIOPSY  11/28/2016    Procedure: BIOPSY;  Surgeon: Rogene Houston, MD;  Location[facility];  Service: Endoscopy;;  gastric  . [place]  . CERVICAL CONE BIOPSY     cervical lesion  . ESOPHAGOGASTRODUODENOSCOPY (EGD) WITH PROPOFOL N/A 11/28/2016   Procedure: ESOPHAGOGASTRODUODENOSCOPY (EGD) WITH PROPOFOL;  Surgeon: Rogene Houston, MD;  [facility];  Service: Endoscopy;  Laterality: N/A;  9:25  . IR FLUORO GUIDE PORT INSERTION RIGHT  01/29/2017  . IR US GUIDE VASC ACCESS RIGHT  01/29/2017  . lipoma removal     right shoulder 2001  . MULTIPLE EXTRACTIONS WITH ALVEOLOPLASTY Bilateral 08/24/2012   Procedure: MULTIPLE EXTRACTION WITH ALVEOLOPLASTY BIOPSY OF RIGHT AND LEFT MANDIBLE ;  Surgeon: Gae Bon, DDS;  Location: [place];  Service: Oral Surgery;  Laterality: Bilateral;  . POSTERIOR LUMBAR FUSION 4 LEVEL N/A 12/26/2016   Procedure: THORACIC EIGHT TUMOR RESECTION, THORACIC SIX- THORACIC TEN POSTERIOR SPINAL FUSION;  Surgeon: Ashok Pall, MD;  Location: [place];  Service: Neurosurgery;  Laterality: N/A;  THORACIC 8 TUMOR RESECTION, THORACIC 6- THORACIC 10 POSTERIOR SPINAL FUSION  . ROTATOR CUFF REPAIR     right shoulder  . TUBAL LIGATION      Family History: Family History  Problem Relation Age of Onset  . Diabetes Mother   . Hypertension Mother   . Heart disease Mother   . Diabetes Father   . Heart disease Sister   . Diabetes Sister     Social History: Social History   Social History  . Marital status: Single    Spouse name: N/A  . Number of children: N/A  . Years of education: N/A   Occupational History  . Not on file.   Social History Main Topics  . Smoking status: Former Smoker    Packs/day: 0.50    Years: 6.00    Types: Cigarettes    Quit date: 11/23/2011  . Smokeless tobacco: Never Used     Comment: quit summer 2013  . Alcohol use No  . Drug use: No  . Sexual activity: Not Currently    Birth control/ protection: Post-menopausal   Other Topics Concern  . Not  on file   Social History Narrative  . No narrative on file    Allergies: Allergies  Allergen Reactions  . No Known Allergies     Medications:  Current Outpatient Prescriptions  Medication Sig Dispense Refill  . acyclovir (ZOVIRAX) 400 MG tablet Take 1 tablet (400 mg total) by mouth 2 (two) times daily. 60 tablet 3  . chlorthalidone (HYGROTON) 25 MG tablet Take 25 mg by mouth 2 (two) times daily.   11  . dexamethasone (DECADRON) 4 MG tablet Take 10 tablets (40 mg) on days 1, 8, and 15 of chemo. Repeat every 21 days. 30 tablet 3  . diltiazem (CARDIZEM CD) 300 MG 24 hr capsule Take 331ms by mouth daily  3  . enalapril (VASOTEC) 20 MG tablet Take  20 mg by mouth 2 (two) times daily.     Marland Kitchen gabapentin (NEURONTIN) 300 MG capsule Take 1 capsule (300 mg total) by mouth 3 (three) times daily. 63 capsule 3  . insulin degludec (TRESIBA FLEXTOUCH) 100 UNIT/ML SOPN FlexTouch Pen Inject 0.5 mLs (50 Units total) into the skin daily at 10 pm. 5 pen 2  . Insulin Pen Needle (B-D ULTRAFINE III SHORT PEN) 31G X 8 MM MISC 1 each by Does not apply route as directed. 100 each 3  . lenalidomide (REVLIMID) 25 MG capsule Take one capsule (33m) by mouth once daily on days 1-14 every 21 days. Auth# [id number] 14 capsule 0  . lidocaine-prilocaine (EMLA) cream Apply to affected area once 30 g 3  . LORazepam (ATIVAN) 0.5 MG tablet Take 1 tablet (0.5 mg total) by mouth every 6 (six) hours as needed (Nausea or vomiting). 30 tablet 0  . metFORMIN (GLUCOPHAGE) 1000 MG tablet Take 1,000 mg by mouth 2 (two) times daily.     . ondansetron (ZOFRAN) 8 MG tablet Take 1 tablet (8 mg total) by mouth 2 (two) times daily as needed (Nausea or vomiting). (Patient not taking: Reported on 01/30/2017) 30 tablet 1  . oxyCODONE (OXY IR/ROXICODONE) 5 MG immediate release tablet Take 1 tablet (5 mg total) by mouth every 4 (four) hours as needed for severe pain. 30 tablet 0  . prochlorperazine (COMPAZINE) 10 MG tablet Take 1 tablet (10 mg  total) by mouth every 6 (six) hours as needed (Nausea or vomiting). 30 tablet 1  . rivaroxaban (XARELTO) 10 MG TABS tablet Take 1 tablet (10 mg total) by mouth daily. 30 tablet 3  . tiZANidine (ZANAFLEX) 4 MG tablet Take 1 tablet (4 mg total) by mouth every 6 (six) hours as needed for muscle spasms. 60 tablet 0   No current facility-administered medications for this visit.     Review of Systems: Review of Systems  Gastrointestinal: Positive for nausea.  Musculoskeletal: Positive for back pain and gait problem.  Neurological: Positive for extremity weakness, gait problem and numbness.  All other systems reviewed and are negative.    PHYSICAL EXAMINATION Blood pressure (!) 104/59, pulse 87, temperature 98.9 F (37.2 C), temperature source Oral, resp. rate 18, height '5\' 9"'  (1.753 m), weight 209 lb 6.4 oz (95 kg), SpO2 100 %.  ECOG PERFORMANCE STATUS: 2 - Symptomatic, <50% confined to bed  Physical Exam  Constitutional: She is oriented to person, place, and time and well-developed, well-nourished, and in no distress.  HENT:  Head: Normocephalic and atraumatic.  Mouth/Throat: Oropharynx is clear and moist. No oropharyngeal exudate.  Eyes: Pupils are equal, round, and reactive to light. EOM are normal. No scleral icterus.  Neck: No thyromegaly present.  Cardiovascular: Normal rate, regular rhythm and normal heart sounds.  Exam reveals no gallop.   No murmur heard. Pulmonary/Chest: Effort normal and breath sounds normal. She has no wheezes.  Abdominal: Soft. Bowel sounds are normal. She exhibits no distension and no mass. There is no tenderness. There is no rebound.  Musculoskeletal: Normal range of motion. She exhibits no edema.       Right shoulder: She exhibits no tenderness, no bony tenderness and no swelling.  Neurological: She is alert and oriented to person, place, and time.  Patient ambulates with difficulty and uses a walker.     LABORATORY DATA: I have personally reviewed  the data as listed: Appointment on 02/20/2017  Component Date Value Ref Range Status  . WBC 02/20/2017 4.9  3.9 - 10.3 10e3/uL Final  . NEUT# 02/20/2017 3.5  1.5 - 6.5 10e3/uL Final  . HGB 02/20/2017 10.6* 11.6 - 15.9 g/dL Final  . HCT 02/20/2017 30.7* 34.8 - 46.6 % Final  . Platelets 02/20/2017 512* 145 - 400 10e3/uL Final  . MCV 02/20/2017 90.3  79.5 - 101.0 fL Final  . MCH 02/20/2017 31.1  25.1 - 34.0 pg Final  . MCHC 02/20/2017 34.4  31.5 - 36.0 g/dL Final  . RBC 02/20/2017 3.41* 3.70 - 5.45 10e6/uL Final  . RDW 02/20/2017 14.2  11.2 - 14.5 % Final  . lymph# 02/20/2017 1.1  0.9 - 3.3 10e3/uL Final  . MONO# 02/20/2017 0.3  0.1 - 0.9 10e3/uL Final  . Eosinophils Absolute 02/20/2017 0.1  0.0 - 0.5 10e3/uL Final  . Basophils Absolute 02/20/2017 0.0  0.0 - 0.1 10e3/uL Final  . NEUT% 02/20/2017 70.1  38.4 - 76.8 % Final  . LYMPH% 02/20/2017 21.9  14.0 - 49.7 % Final  . MONO% 02/20/2017 6.5  0.0 - 14.0 % Final  . EOS% 02/20/2017 1.3  0.0 - 7.0 % Final  . BASO% 02/20/2017 0.2  0.0 - 2.0 % Final  . Sodium 02/20/2017 136  136 - 145 mEq/L Final  . Potassium 02/20/2017 3.7  3.5 - 5.1 mEq/L Final  . Chloride 02/20/2017 100  98 - 109 mEq/L Final  . CO2 02/20/2017 25  22 - 29 mEq/L Final  . Glucose 02/20/2017 236* 70 - 140 mg/dl Final   Glucose reference range is for nonfasting patients. Fasting glucose reference range is 70- 100.  Marland Kitchen BUN 02/20/2017 11.3  7.0 - 26.0 mg/dL Final  . Creatinine 02/20/2017 1.0  0.6 - 1.1 mg/dL Final  . Total Bilirubin 02/20/2017 0.28  0.20 - 1.20 mg/dL Final  . Alkaline Phosphatase 02/20/2017 93  40 - 150 U/L Final  . AST 02/20/2017 11  5 - 34 U/L Final  . ALT 02/20/2017 8  0 - 55 U/L Final  . Total Protein 02/20/2017 7.6  6.4 - 8.3 g/dL Final  . Albumin 02/20/2017 3.1* 3.5 - 5.0 g/dL Final  . Calcium 02/20/2017 10.0  8.4 - 10.4 mg/dL Final  . Anion Gap 02/20/2017 11  3 - 11 mEq/L Final  . EGFR 02/20/2017 70* >90 ml/min/1.73 m2 Final   eGFR is calculated using  the CKD-EPI Creatinine Equation (2009)  . Uric Acid, Serum 02/20/2017 6.5  2.6 - 7.4 mg/dl Final  . Ig Kappa Free Light Chain 02/20/2017 75.8* 3.3 - 19.4 mg/L Final  . Ig Lambda Free Light Chain 02/20/2017 15.6  5.7 - 26.3 mg/L Final  . Kappa/Lambda FluidC Ratio 02/20/2017 4.86* 0.26 - 1.65 Final       [name], MD

## 2017-02-21 NOTE — Assessment & Plan Note (Signed)
61 year old female who initially presented with significant weight loss in the context of H. pylori-associated gastritis and alcohol abuse. At the same time, an incidental discovery of a lower thoracic vertebral mass in the context of chronic numbness and weakness in the lower extremities. MRI of the thoracic spine demonstrated a destructive lesion at T8 level and patient underwent hematological treatment. 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 Staging evaluation contacted by our clinic confirmed presence of IgG kappa multiple myeloma. 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 Based on initial information, patient is now staged at [facility].   Patient was initiated on induction systemic chemotherapy with lenalidomide, bortezomib, and low-dose dexamethasone 3 weeks ago. She appears to have tolerated the initial cycle of treatment without difficulties. Clinical evaluation and lab work today permissive to proceed with a second cycle of scheduled therapy without alteration to the ptosis of the medications. Patient has grade 1 nausea, partially attributable to insufficient utilization of prescribed antiemetics. Appropriate use of prescribed antiemetics has been reviewed with the patient in the clinic today.  Patient is now scheduled to see bone marrow transplant service at [facility] on 03/26/17.  Plan: --Proceed with Cycle #2 of RVd as outlined in the oncological history with zoledronic acid administered every 6 weeks (will receive with the next cycle). --Supportive care with Bactrim and acyclovir to reduce the risk of PCP and VZV infections respectively; at the last visit, patient was started on prophylactic dose of Rivaroxaban (Xarelto) due to superficial venous thrombosis and as a prophylaxis for thrombosis due to use of lenalidomide. --Patient will return to our clinic in 3 weeks with lab work for  possible consideration of the third cycle of therapy. Subsequent to the third cycle of therapy, patient will be seen at the [facility] and will likely undergo restaging bone marrow biopsy there.

## 2017-02-21 NOTE — Telephone Encounter (Signed)
Left message for patient regarding upcoming September appointments.  °

## 2017-02-25 LAB — PROTEIN ELECTROPHORESIS, SERUM, WITH REFLEX
A/G Ratio: 0.9 (ref 0.7–1.7)
ALPHA 1: 0.3 g/dL (ref 0.0–0.4)
ALPHA 2: 0.9 g/dL (ref 0.4–1.0)
Albumin: 3.3 g/dL (ref 2.9–4.4)
BETA: 1.1 g/dL (ref 0.7–1.3)
GAMMA GLOBULIN: 1.5 g/dL (ref 0.4–1.8)
GLOBULIN, TOTAL: 3.8 g/dL (ref 2.2–3.9)
INTERPRETATION(SEE BELOW): 0
IgA, Qn, Serum: 118 mg/dL (ref 87–352)
IgG, Qn, Serum: 1447 mg/dL (ref 700–1600)
IgM, Qn, Serum: 31 mg/dL (ref 26–217)
M-SPIKE, %: 1.1 g/dL — AB
TOTAL PROTEIN: 7.1 g/dL (ref 6.0–8.5)

## 2017-02-27 ENCOUNTER — Other Ambulatory Visit: Payer: Self-pay

## 2017-02-27 ENCOUNTER — Telehealth: Payer: Self-pay

## 2017-02-27 DIAGNOSIS — C9000 Multiple myeloma not having achieved remission: Secondary | ICD-10-CM

## 2017-02-27 NOTE — Telephone Encounter (Signed)
No 0900 appt available tomorrow morning for labs. Pt scheduled for 0915, but told pt to arrive at 0900 prior to labs and infusion. Pt verbalized understanding.

## 2017-02-28 ENCOUNTER — Other Ambulatory Visit (HOSPITAL_BASED_OUTPATIENT_CLINIC_OR_DEPARTMENT_OTHER): Payer: Medicare Other

## 2017-02-28 ENCOUNTER — Ambulatory Visit (HOSPITAL_BASED_OUTPATIENT_CLINIC_OR_DEPARTMENT_OTHER): Payer: Medicare Other

## 2017-02-28 VITALS — BP 134/80 | HR 98 | Temp 99.1°F | Resp 18

## 2017-02-28 DIAGNOSIS — C9000 Multiple myeloma not having achieved remission: Secondary | ICD-10-CM | POA: Diagnosis not present

## 2017-02-28 DIAGNOSIS — Z5112 Encounter for antineoplastic immunotherapy: Secondary | ICD-10-CM | POA: Diagnosis not present

## 2017-02-28 LAB — CBC WITH DIFFERENTIAL/PLATELET
BASO%: 1.5 % (ref 0.0–2.0)
BASOS ABS: 0.1 10*3/uL (ref 0.0–0.1)
EOS%: 6.7 % (ref 0.0–7.0)
Eosinophils Absolute: 0.4 10*3/uL (ref 0.0–0.5)
HEMATOCRIT: 32.3 % — AB (ref 34.8–46.6)
HEMOGLOBIN: 10.8 g/dL — AB (ref 11.6–15.9)
LYMPH%: 10.7 % — ABNORMAL LOW (ref 14.0–49.7)
MCH: 30.6 pg (ref 25.1–34.0)
MCHC: 33.4 g/dL (ref 31.5–36.0)
MCV: 91.5 fL (ref 79.5–101.0)
MONO#: 0.2 10*3/uL (ref 0.1–0.9)
MONO%: 3.3 % (ref 0.0–14.0)
NEUT#: 4.7 10*3/uL (ref 1.5–6.5)
NEUT%: 77.8 % — AB (ref 38.4–76.8)
NRBC: 0 % (ref 0–0)
Platelets: 295 10*3/uL (ref 145–400)
RBC: 3.53 10*6/uL — ABNORMAL LOW (ref 3.70–5.45)
RDW: 14.1 % (ref 11.2–14.5)
WBC: 6 10*3/uL (ref 3.9–10.3)
lymph#: 0.6 10*3/uL — ABNORMAL LOW (ref 0.9–3.3)

## 2017-02-28 LAB — COMPREHENSIVE METABOLIC PANEL
ALBUMIN: 3.2 g/dL — AB (ref 3.5–5.0)
ALT: 11 U/L (ref 0–55)
AST: 11 U/L (ref 5–34)
Alkaline Phosphatase: 93 U/L (ref 40–150)
Anion Gap: 11 mEq/L (ref 3–11)
BILIRUBIN TOTAL: 0.26 mg/dL (ref 0.20–1.20)
BUN: 14.4 mg/dL (ref 7.0–26.0)
CALCIUM: 9.4 mg/dL (ref 8.4–10.4)
CO2: 24 mEq/L (ref 22–29)
Chloride: 102 mEq/L (ref 98–109)
Creatinine: 1.2 mg/dL — ABNORMAL HIGH (ref 0.6–1.1)
EGFR: 57 mL/min/{1.73_m2} — ABNORMAL LOW (ref >90)
GLUCOSE: 209 mg/dL — AB (ref 70–140)
Potassium: 4 mEq/L (ref 3.5–5.1)
SODIUM: 137 meq/L (ref 136–145)
TOTAL PROTEIN: 7.1 g/dL (ref 6.4–8.3)

## 2017-02-28 MED ORDER — HEPARIN SOD (PORK) LOCK FLUSH 100 UNIT/ML IV SOLN
250.0000 [IU] | Freq: Once | INTRAVENOUS | Status: DC | PRN
  Filled 2017-02-28: qty 5

## 2017-02-28 MED ORDER — SODIUM CHLORIDE 0.9% FLUSH
10.0000 mL | INTRAVENOUS | Status: DC | PRN
  Administered 2017-02-28: 10 mL
  Filled 2017-02-28: qty 10

## 2017-02-28 MED ORDER — PROCHLORPERAZINE MALEATE 10 MG PO TABS
10.0000 mg | ORAL_TABLET | Freq: Once | ORAL | Status: AC
  Administered 2017-02-28: 10 mg via ORAL

## 2017-02-28 MED ORDER — PROCHLORPERAZINE MALEATE 10 MG PO TABS
ORAL_TABLET | ORAL | Status: AC
  Filled 2017-02-28: qty 1

## 2017-02-28 MED ORDER — HEPARIN SOD (PORK) LOCK FLUSH 100 UNIT/ML IV SOLN
500.0000 [IU] | Freq: Once | INTRAVENOUS | Status: AC | PRN
  Administered 2017-02-28: 500 [IU]
  Filled 2017-02-28: qty 5

## 2017-02-28 MED ORDER — ZOLEDRONIC ACID 4 MG/100ML IV SOLN: 4.0000 mg | Freq: Once | INTRAVENOUS | Status: DC

## 2017-02-28 MED ORDER — BORTEZOMIB CHEMO SQ INJECTION 3.5 MG (2.5MG/ML)
1.3000 mg/m2 | Freq: Once | INTRAMUSCULAR | Status: AC
  Administered 2017-02-28: 2.75 mg via SUBCUTANEOUS
  Filled 2017-02-28: qty 2.75

## 2017-02-28 MED ORDER — SODIUM CHLORIDE 0.9% FLUSH
3.0000 mL | Freq: Once | INTRAVENOUS | Status: DC | PRN
  Filled 2017-02-28: qty 10

## 2017-02-28 MED ORDER — SODIUM CHLORIDE 0.9 % IV SOLN
Freq: Once | INTRAVENOUS | Status: AC
  Administered 2017-02-28: 09:00:00 via INTRAVENOUS

## 2017-02-28 NOTE — Patient Instructions (Signed)
[facility] Discharge Instructions for Patients Receiving Chemotherapy  Today you received the following chemotherapy agents bortezomib (Velcade)  To help prevent nausea and vomiting after your treatment, we encourage you to take your nausea medication as directed by your MD.  If you develop nausea and vomiting that is not controlled by your nausea medication, call the clinic.   BELOW ARE SYMPTOMS THAT SHOULD BE REPORTED IMMEDIATELY:  *FEVER GREATER THAN 100.5 F  *CHILLS WITH OR WITHOUT FEVER  NAUSEA AND VOMITING THAT IS NOT CONTROLLED WITH YOUR NAUSEA MEDICATION  *UNUSUAL SHORTNESS OF BREATH  *UNUSUAL BRUISING OR BLEEDING  TENDERNESS IN MOUTH AND THROAT WITH OR WITHOUT PRESENCE OF ULCERS  *URINARY PROBLEMS  *BOWEL PROBLEMS  UNUSUAL RASH Items with * indicate a potential emergency and should be followed up as soon as possible.  Feel free to call the clinic you have any questions or concerns. The clinic phone number is [phone].  Please show the [facility] at check-in to the Emergency Department and triage nurse.  Zoledronic Acid injection (Hypercalcemia, Oncology) What is this medicine? ZOLEDRONIC ACID (ZOE le dron ik AS id) lowers the amount of calcium loss from bone. It is used to treat too much calcium in your blood from cancer. It is also used to prevent complications of cancer that has spread to the bone. This medicine may be used for other purposes; ask your health care provider or pharmacist if you have questions. COMMON BRAND NAME(S): Zometa What should I tell my health care provider before I take this medicine? They need to know if you have any of these conditions: -aspirin-sensitive asthma -cancer, especially if you are receiving medicines used to treat cancer -dental disease or wear dentures -infection -kidney disease -receiving corticosteroids like dexamethasone or prednisone -an unusual or allergic reaction to zoledronic acid,  other medicines, foods, dyes, or preservatives -pregnant or trying to get pregnant -breast-feeding How should I use this medicine? This medicine is for infusion into a vein. It is given by a health care professional in a hospital or clinic setting. Talk to your pediatrician regarding the use of this medicine in children. Special care may be needed. Overdosage: If you think you have taken too much of this medicine contact a poison control center or emergency room at once. NOTE: This medicine is only for you. Do not share this medicine with others. What if I miss a dose? It is important not to miss your dose. Call your doctor or health care professional if you are unable to keep an appointment. What may interact with this medicine? -certain antibiotics given by injection -NSAIDs, medicines for pain and inflammation, like ibuprofen or naproxen -some diuretics like bumetanide, furosemide -teriparatide -thalidomide This list may not describe all possible interactions. Give your health care provider a list of all the medicines, herbs, non-prescription drugs, or dietary supplements you use. Also tell them if you smoke, drink alcohol, or use illegal drugs. Some items may interact with your medicine. What should I watch for while using this medicine? Visit your doctor or health care professional for regular checkups. It may be some time before you see the benefit from this medicine. Do not stop taking your medicine unless your doctor tells you to. Your doctor may order blood tests or other tests to see how you are doing. Women should inform their doctor if they wish to become pregnant or think they might be pregnant. There is a potential for serious side effects to an unborn child.  Talk to your health care professional or pharmacist for more information. You should make sure that you get enough calcium and vitamin D while you are taking this medicine. Discuss the foods you eat and the vitamins you take  with your health care professional. Some people who take this medicine have severe bone, joint, and/or muscle pain. This medicine may also increase your risk for jaw problems or a broken thigh bone. Tell your doctor right away if you have severe pain in your jaw, bones, joints, or muscles. Tell your doctor if you have any pain that does not go away or that gets worse. Tell your dentist and dental surgeon that you are taking this medicine. You should not have major dental surgery while on this medicine. See your dentist to have a dental exam and fix any dental problems before starting this medicine. Take good care of your teeth while on this medicine. Make sure you see your dentist for regular follow-up appointments. What side effects may I notice from receiving this medicine? Side effects that you should report to your doctor or health care professional as soon as possible: -allergic reactions like skin rash, itching or hives, swelling of the face, lips, or tongue -anxiety, confusion, or depression -breathing problems -changes in vision -eye pain -feeling faint or lightheaded, falls -jaw pain, especially after dental work -mouth sores -muscle cramps, stiffness, or weakness -redness, blistering, peeling or loosening of the skin, including inside the mouth -trouble passing urine or change in the amount of urine Side effects that usually do not require medical attention (report to your doctor or health care professional if they continue or are bothersome): -bone, joint, or muscle pain -constipation -diarrhea -fever -hair loss -irritation at site where injected -loss of appetite -nausea, vomiting -stomach upset -trouble sleeping -trouble swallowing -weak or tired This list may not describe all possible side effects. Call your doctor for medical advice about side effects. You may report side effects to FDA at 1-800-FDA-1088. Where should I keep my medicine? This drug is given in a hospital  or clinic and will not be stored at home. NOTE: This sheet is a summary. It may not cover all possible information. If you have questions about this medicine, talk to your doctor, pharmacist, or health care provider.  2018 Elsevier/Gold Standard (2013-10-30 14:19:39)

## 2017-03-06 ENCOUNTER — Ambulatory Visit (HOSPITAL_BASED_OUTPATIENT_CLINIC_OR_DEPARTMENT_OTHER): Payer: Medicare Other

## 2017-03-06 ENCOUNTER — Other Ambulatory Visit: Payer: Self-pay

## 2017-03-06 VITALS — BP 112/71 | HR 93 | Temp 98.7°F | Resp 17

## 2017-03-06 DIAGNOSIS — C9000 Multiple myeloma not having achieved remission: Secondary | ICD-10-CM

## 2017-03-06 DIAGNOSIS — Z5112 Encounter for antineoplastic immunotherapy: Secondary | ICD-10-CM | POA: Diagnosis present

## 2017-03-06 LAB — COMPREHENSIVE METABOLIC PANEL
ALT: 10 U/L (ref 0–55)
ANION GAP: 11 meq/L (ref 3–11)
AST: 9 U/L (ref 5–34)
Albumin: 3.2 g/dL — ABNORMAL LOW (ref 3.5–5.0)
Alkaline Phosphatase: 105 U/L (ref 40–150)
BUN: 18.3 mg/dL (ref 7.0–26.0)
CHLORIDE: 101 meq/L (ref 98–109)
CO2: 25 meq/L (ref 22–29)
CREATININE: 1.4 mg/dL — AB (ref 0.6–1.1)
Calcium: 9.4 mg/dL (ref 8.4–10.4)
EGFR: 48 mL/min/{1.73_m2} — AB (ref >90)
Glucose: 250 mg/dl — ABNORMAL HIGH (ref 70–140)
POTASSIUM: 3.5 meq/L (ref 3.5–5.1)
Sodium: 137 mEq/L (ref 136–145)
Total Bilirubin: 0.26 mg/dL (ref 0.20–1.20)
Total Protein: 7.1 g/dL (ref 6.4–8.3)

## 2017-03-06 LAB — CBC WITH DIFFERENTIAL/PLATELET
BASO%: 0.8 % (ref 0.0–2.0)
Basophils Absolute: 0.1 10*3/uL (ref 0.0–0.1)
EOS ABS: 0.4 10*3/uL (ref 0.0–0.5)
EOS%: 6.8 % (ref 0.0–7.0)
HCT: 32.2 % — ABNORMAL LOW (ref 34.8–46.6)
HGB: 10.8 g/dL — ABNORMAL LOW (ref 11.6–15.9)
LYMPH%: 12.9 % — ABNORMAL LOW (ref 14.0–49.7)
MCH: 30.7 pg (ref 25.1–34.0)
MCHC: 33.5 g/dL (ref 31.5–36.0)
MCV: 91.5 fL (ref 79.5–101.0)
MONO#: 0.4 10*3/uL (ref 0.1–0.9)
MONO%: 6.5 % (ref 0.0–14.0)
NEUT%: 73 % (ref 38.4–76.8)
NEUTROS ABS: 4.6 10*3/uL (ref 1.5–6.5)
PLATELETS: 267 10*3/uL (ref 145–400)
RBC: 3.52 10*6/uL — AB (ref 3.70–5.45)
RDW: 14.2 % (ref 11.2–14.5)
WBC: 6.3 10*3/uL (ref 3.9–10.3)
lymph#: 0.8 10*3/uL — ABNORMAL LOW (ref 0.9–3.3)

## 2017-03-06 MED ORDER — PROCHLORPERAZINE MALEATE 10 MG PO TABS
10.0000 mg | ORAL_TABLET | Freq: Once | ORAL | Status: AC
  Administered 2017-03-06: 10 mg via ORAL

## 2017-03-06 MED ORDER — PROCHLORPERAZINE MALEATE 10 MG PO TABS
ORAL_TABLET | ORAL | Status: AC
  Filled 2017-03-06: qty 1

## 2017-03-06 MED ORDER — BORTEZOMIB CHEMO SQ INJECTION 3.5 MG (2.5MG/ML)
1.3000 mg/m2 | Freq: Once | INTRAMUSCULAR | Status: AC
  Administered 2017-03-06: 2.75 mg via SUBCUTANEOUS
  Filled 2017-03-06: qty 2.75

## 2017-03-06 NOTE — Patient Instructions (Signed)
[facility] Discharge Instructions for Patients Receiving Chemotherapy  Today you received the following chemotherapy agents Velcade  To help prevent nausea and vomiting after your treatment, we encourage you to take your nausea medication as directed by your MD.  If you develop nausea and vomiting that is not controlled by your nausea medication, call the clinic.   BELOW ARE SYMPTOMS THAT SHOULD BE REPORTED IMMEDIATELY:  *FEVER GREATER THAN 100.5 F  *CHILLS WITH OR WITHOUT FEVER  NAUSEA AND VOMITING THAT IS NOT CONTROLLED WITH YOUR NAUSEA MEDICATION  *UNUSUAL SHORTNESS OF BREATH  *UNUSUAL BRUISING OR BLEEDING  TENDERNESS IN MOUTH AND THROAT WITH OR WITHOUT PRESENCE OF ULCERS  *URINARY PROBLEMS  *BOWEL PROBLEMS  UNUSUAL RASH Items with * indicate a potential emergency and should be followed up as soon as possible.  Feel free to call the clinic you have any questions or concerns. The clinic phone number is [phone].  Please show the [facility] at check-in to the Emergency Department and triage nurse.

## 2017-03-13 ENCOUNTER — Ambulatory Visit (HOSPITAL_BASED_OUTPATIENT_CLINIC_OR_DEPARTMENT_OTHER): Payer: Medicare Other

## 2017-03-13 ENCOUNTER — Ambulatory Visit (HOSPITAL_BASED_OUTPATIENT_CLINIC_OR_DEPARTMENT_OTHER): Payer: Medicare Other | Admitting: Hematology and Oncology

## 2017-03-13 ENCOUNTER — Other Ambulatory Visit: Payer: Self-pay

## 2017-03-13 ENCOUNTER — Telehealth: Payer: Self-pay | Admitting: Hematology and Oncology

## 2017-03-13 ENCOUNTER — Other Ambulatory Visit (HOSPITAL_BASED_OUTPATIENT_CLINIC_OR_DEPARTMENT_OTHER): Payer: Medicare Other

## 2017-03-13 VITALS — BP 104/62 | HR 87 | Temp 98.4°F | Resp 19

## 2017-03-13 DIAGNOSIS — Z5112 Encounter for antineoplastic immunotherapy: Secondary | ICD-10-CM

## 2017-03-13 DIAGNOSIS — Z720 Tobacco use: Secondary | ICD-10-CM | POA: Diagnosis not present

## 2017-03-13 DIAGNOSIS — Z5111 Encounter for antineoplastic chemotherapy: Secondary | ICD-10-CM

## 2017-03-13 DIAGNOSIS — C9000 Multiple myeloma not having achieved remission: Secondary | ICD-10-CM

## 2017-03-13 DIAGNOSIS — Z716 Tobacco abuse counseling: Secondary | ICD-10-CM

## 2017-03-13 LAB — CBC WITH DIFFERENTIAL/PLATELET
BASO%: 0.7 % (ref 0.0–2.0)
BASOS ABS: 0 10*3/uL (ref 0.0–0.1)
EOS ABS: 0.3 10*3/uL (ref 0.0–0.5)
EOS%: 6.1 % (ref 0.0–7.0)
HCT: 31.8 % — ABNORMAL LOW (ref 34.8–46.6)
HEMOGLOBIN: 10.6 g/dL — AB (ref 11.6–15.9)
LYMPH%: 26.4 % (ref 14.0–49.7)
MCH: 30.5 pg (ref 25.1–34.0)
MCHC: 33.3 g/dL (ref 31.5–36.0)
MCV: 91.4 fL (ref 79.5–101.0)
MONO#: 0.4 10*3/uL (ref 0.1–0.9)
MONO%: 7.6 % (ref 0.0–14.0)
NEUT#: 3.2 10*3/uL (ref 1.5–6.5)
NEUT%: 59.2 % (ref 38.4–76.8)
Platelets: 229 10*3/uL (ref 145–400)
RBC: 3.48 10*6/uL — AB (ref 3.70–5.45)
RDW: 14.4 % (ref 11.2–14.5)
WBC: 5.4 10*3/uL (ref 3.9–10.3)
lymph#: 1.4 10*3/uL (ref 0.9–3.3)

## 2017-03-13 LAB — COMPREHENSIVE METABOLIC PANEL
ALBUMIN: 3.1 g/dL — AB (ref 3.5–5.0)
ALK PHOS: 98 U/L (ref 40–150)
ALT: 10 U/L (ref 0–55)
ANION GAP: 10 meq/L (ref 3–11)
AST: 9 U/L (ref 5–34)
BUN: 11.2 mg/dL (ref 7.0–26.0)
CO2: 25 mEq/L (ref 22–29)
Calcium: 9.4 mg/dL (ref 8.4–10.4)
Chloride: 103 mEq/L (ref 98–109)
Creatinine: 1.1 mg/dL (ref 0.6–1.1)
EGFR: 66 mL/min/{1.73_m2} — AB (ref >90)
GLUCOSE: 326 mg/dL — AB (ref 70–140)
POTASSIUM: 3.6 meq/L (ref 3.5–5.1)
SODIUM: 137 meq/L (ref 136–145)
Total Bilirubin: 0.34 mg/dL (ref 0.20–1.20)
Total Protein: 6.8 g/dL (ref 6.4–8.3)

## 2017-03-13 LAB — LACTATE DEHYDROGENASE: LDH: 160 U/L (ref 125–245)

## 2017-03-13 MED ORDER — SODIUM CHLORIDE 0.9% FLUSH
3.0000 mL | Freq: Once | INTRAVENOUS | Status: AC | PRN
  Administered 2017-03-13: 3 mL via INTRAVENOUS
  Filled 2017-03-13: qty 10

## 2017-03-13 MED ORDER — PROCHLORPERAZINE MALEATE 10 MG PO TABS
ORAL_TABLET | ORAL | Status: AC
  Filled 2017-03-13: qty 1

## 2017-03-13 MED ORDER — NICOTINE 7 MG/24HR TD PT24
14.0000 mg | MEDICATED_PATCH | Freq: Every day | TRANSDERMAL | Status: DC
Start: 2017-03-13 — End: 2017-03-13

## 2017-03-13 MED ORDER — SODIUM CHLORIDE 0.9 % IV SOLN
Freq: Once | INTRAVENOUS | Status: AC
  Administered 2017-03-13: 11:00:00 via INTRAVENOUS

## 2017-03-13 MED ORDER — NICOTINE POLACRILEX 2 MG MT LOZG: 2.0000 mg | LOZENGE | OROMUCOSAL | 0 refills | Status: DC | PRN

## 2017-03-13 MED ORDER — LENALIDOMIDE 25 MG PO CAPS: ORAL_CAPSULE | ORAL | 0 refills | Status: DC

## 2017-03-13 MED ORDER — OXYCODONE HCL 5 MG PO TABS: 5.0000 mg | ORAL_TABLET | ORAL | 0 refills | Status: DC | PRN

## 2017-03-13 MED ORDER — BORTEZOMIB CHEMO SQ INJECTION 3.5 MG (2.5MG/ML)
1.3000 mg/m2 | Freq: Once | INTRAMUSCULAR | Status: AC
  Administered 2017-03-13: 2.75 mg via SUBCUTANEOUS
  Filled 2017-03-13: qty 2.75

## 2017-03-13 MED ORDER — ZOLEDRONIC ACID 4 MG/100ML IV SOLN
4.0000 mg | Freq: Once | INTRAVENOUS | Status: AC
  Administered 2017-03-13: 4 mg via INTRAVENOUS
  Filled 2017-03-13: qty 100

## 2017-03-13 MED ORDER — HEPARIN SOD (PORK) LOCK FLUSH 100 UNIT/ML IV SOLN
250.0000 [IU] | Freq: Once | INTRAVENOUS | Status: AC | PRN
  Administered 2017-03-13: 250 [IU]
  Filled 2017-03-13: qty 5

## 2017-03-13 MED ORDER — PROCHLORPERAZINE MALEATE 10 MG PO TABS
10.0000 mg | ORAL_TABLET | Freq: Once | ORAL | Status: AC
  Administered 2017-03-13: 10 mg via ORAL

## 2017-03-13 MED ORDER — NICOTINE 14 MG/24HR TD PT24: 14.0000 mg | MEDICATED_PATCH | Freq: Every day | TRANSDERMAL | 0 refills | Status: DC

## 2017-03-13 NOTE — Telephone Encounter (Signed)
Scheduled appt per 9/27 los - Gave patient AVS and calender per los.  

## 2017-03-13 NOTE — Patient Instructions (Signed)
[facility] Discharge Instructions for Patients Receiving Chemotherapy  Today you received the following chemotherapy agents :  Velcade.  To help prevent nausea and vomiting after your treatment, we encourage you to take your nausea medication as prescribed.   If you develop nausea and vomiting that is not controlled by your nausea medication, call the clinic.   BELOW ARE SYMPTOMS THAT SHOULD BE REPORTED IMMEDIATELY:  *FEVER GREATER THAN 100.5 F  *CHILLS WITH OR WITHOUT FEVER  NAUSEA AND VOMITING THAT IS NOT CONTROLLED WITH YOUR NAUSEA MEDICATION  *UNUSUAL SHORTNESS OF BREATH  *UNUSUAL BRUISING OR BLEEDING  TENDERNESS IN MOUTH AND THROAT WITH OR WITHOUT PRESENCE OF ULCERS  *URINARY PROBLEMS  *BOWEL PROBLEMS  UNUSUAL RASH Items with * indicate a potential emergency and should be followed up as soon as possible.  Feel free to call the clinic should you have any questions or concerns. The clinic phone number is [phone].  Please show the CHEMO ALERT CARD at check-in to the Emergency Department and triage nurse.   Zoledronic Acid injection (Hypercalcemia, Oncology) What is this medicine? ZOLEDRONIC ACID (ZOE le dron ik AS id) lowers the amount of calcium loss from bone. It is used to treat too much calcium in your blood from cancer. It is also used to prevent complications of cancer that has spread to the bone. This medicine may be used for other purposes; ask your health care provider or pharmacist if you have questions. COMMON BRAND NAME(S): Zometa What should I tell my health care provider before I take this medicine? They need to know if you have any of these conditions: -aspirin-sensitive asthma -cancer, especially if you are receiving medicines used to treat cancer -dental disease or wear dentures -infection -kidney disease -receiving corticosteroids like dexamethasone or prednisone -an unusual or allergic reaction to zoledronic acid, other  medicines, foods, dyes, or preservatives -pregnant or trying to get pregnant -breast-feeding How should I use this medicine? This medicine is for infusion into a vein. It is given by a health care professional in a hospital or clinic setting. Talk to your pediatrician regarding the use of this medicine in children. Special care may be needed. Overdosage: If you think you have taken too much of this medicine contact a poison control center or emergency room at once. NOTE: This medicine is only for you. Do not share this medicine with others. What if I miss a dose? It is important not to miss your dose. Call your doctor or health care professional if you are unable to keep an appointment. What may interact with this medicine? -certain antibiotics given by injection -NSAIDs, medicines for pain and inflammation, like ibuprofen or naproxen -some diuretics like bumetanide, furosemide -teriparatide -thalidomide This list may not describe all possible interactions. Give your health care provider a list of all the medicines, herbs, non-prescription drugs, or dietary supplements you use. Also tell them if you smoke, drink alcohol, or use illegal drugs. Some items may interact with your medicine. What should I watch for while using this medicine? Visit your doctor or health care professional for regular checkups. It may be some time before you see the benefit from this medicine. Do not stop taking your medicine unless your doctor tells you to. Your doctor may order blood tests or other tests to see how you are doing. Women should inform their doctor if they wish to become pregnant or think they might be pregnant. There is a potential for serious side effects to an unborn   child. Talk to your health care professional or pharmacist for more information. You should make sure that you get enough calcium and vitamin D while you are taking this medicine. Discuss the foods you eat and the vitamins you take with  your health care professional. Some people who take this medicine have severe bone, joint, and/or muscle pain. This medicine may also increase your risk for jaw problems or a broken thigh bone. Tell your doctor right away if you have severe pain in your jaw, bones, joints, or muscles. Tell your doctor if you have any pain that does not go away or that gets worse. Tell your dentist and dental surgeon that you are taking this medicine. You should not have major dental surgery while on this medicine. See your dentist to have a dental exam and fix any dental problems before starting this medicine. Take good care of your teeth while on this medicine. Make sure you see your dentist for regular follow-up appointments. What side effects may I notice from receiving this medicine? Side effects that you should report to your doctor or health care professional as soon as possible: -allergic reactions like skin rash, itching or hives, swelling of the face, lips, or tongue -anxiety, confusion, or depression -breathing problems -changes in vision -eye pain -feeling faint or lightheaded, falls -jaw pain, especially after dental work -mouth sores -muscle cramps, stiffness, or weakness -redness, blistering, peeling or loosening of the skin, including inside the mouth -trouble passing urine or change in the amount of urine Side effects that usually do not require medical attention (report to your doctor or health care professional if they continue or are bothersome): -bone, joint, or muscle pain -constipation -diarrhea -fever -hair loss -irritation at site where injected -loss of appetite -nausea, vomiting -stomach upset -trouble sleeping -trouble swallowing -weak or tired This list may not describe all possible side effects. Call your doctor for medical advice about side effects. You may report side effects to FDA at 1-800-FDA-1088. Where should I keep my medicine? This drug is given in a hospital or  clinic and will not be stored at home. NOTE: This sheet is a summary. It may not cover all possible information. If you have questions about this medicine, talk to your doctor, pharmacist, or health care provider.  2018 Elsevier/Gold Standard (2013-10-30 14:19:39)  

## 2017-03-13 NOTE — Progress Notes (Signed)
Dr. Lebron Conners okay for pt to have labs obtained every other week for velcade. CBC and CMET will be drawn every other treatment. Labs cancelled 10/11 and 10/25. Pt called and verbalized understanding to cross those lab appointments off of her schedule.

## 2017-03-14 LAB — KAPPA/LAMBDA LIGHT CHAINS
Ig Kappa Free Light Chain: 16.8 mg/L (ref 3.3–19.4)
Ig Lambda Free Light Chain: 17.7 mg/L (ref 5.7–26.3)
Kappa/Lambda FluidC Ratio: 0.95 (ref 0.26–1.65)

## 2017-03-14 LAB — IGG, IGA, IGM
IGM (IMMUNOGLOBIN M), SRM: 36 mg/dL (ref 26–217)
IgA, Qn, Serum: 109 mg/dL (ref 87–352)
IgG, Qn, Serum: 844 mg/dL (ref 700–1600)

## 2017-03-17 LAB — PROTEIN ELECTROPHORESIS, SERUM
A/G RATIO SPE: 1 (ref 0.7–1.7)
ALBUMIN: 3 g/dL (ref 2.9–4.4)
Alpha 1: 0.3 g/dL (ref 0.0–0.4)
Alpha 2: 1 g/dL (ref 0.4–1.0)
BETA: 1 g/dL (ref 0.7–1.3)
GLOBULIN, TOTAL: 3.1 g/dL (ref 2.2–3.9)
Gamma Globulin: 0.9 g/dL (ref 0.4–1.8)
M-Spike, %: 0.4 g/dL — ABNORMAL HIGH
TOTAL PROTEIN: 6.1 g/dL (ref 6.0–8.5)

## 2017-03-19 ENCOUNTER — Encounter: Payer: Self-pay | Admitting: Hematology and Oncology

## 2017-03-19 NOTE — Assessment & Plan Note (Signed)
61 y.o. female who initially presented with significant weight loss in the context of H. pylori-associated gastritis and alcohol abuse. At the same time, an incidental discovery of a lower thoracic vertebral mass in the context of chronic numbness and weakness in the lower extremities. MRI of the thoracic spine demonstrated a destructive lesion at T8 level and patient underwent hematological treatment. 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 Staging evaluation contacted by our clinic confirmed presence of IgG kappa multiple myeloma. 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 Based on initial information, patient is now staged at [facility].   Patient was initiated on induction systemic chemotherapy with lenalidomide, bortezomib, and low-dose dexamethasone. Patient has received two cycles of therapy so far. Clinical evaluation lab work today are permissive to proceed with third cycle of treatment. Following administration of the cycle, patient will be evaluated at [facility] with likely bone marrow biopsy to assess response to treatment. Subsequently, based on their recommendations, we will proceed with continual induction or transition to consolidation therapy.  Plan: --Proceed with Cycle #3 of RVd + zoledronic acid --Supportive care with Bactrim and acyclovir to reduce the risk of PCP and VZV infections respectively; at the last visit, patient was started on prophylactic dose of Rivaroxaban (Xarelto) due to superficial venous thrombosis and as a prophylaxis for thrombosis due to use of lenalidomide. -- nicotine patch 14 mg q.d. Plus nicotine lozenge to assist in smoking cessation --Patient will return to our clinic in 3 weeks with lab work for possible consideration of the 4th cycle of therapy.

## 2017-03-19 NOTE — Progress Notes (Signed)
[facility] Follow-up Visit:  Assessment: No problem-specific Assessment & Plan notes found for this encounter.  Voice recognition software was used and creation of this note. Despite my best effort at editing the text, some misspelling/errors may have occurred.  No orders of the defined types were placed in this encounter.   Cancer Staging Multiple myeloma not having achieved remission ([place]) Staging form: Plasma Cell Myeloma and Plasma Cell Disorders, AJCC 8th Edition - Clinical stage from 01/24/2017: RISS Stage II (Beta-2-microglobulin (mg/L): 2.3, Albumin (g/dL): 2.9, ISS: Stage II, High-risk cytogenetics: Absent, LDH: Normal) - Signed by [name], MD on 02/21/2017   All questions were answered.  . The patient knows to call the clinic with any problems, questions or concerns.  This note was electronically signed.    History of Presenting Illness Christine Garrett is a  61 y.o. female followed in the [facility] for diagnosis of active IgG kappa multiple myeloma. Please see oncologic history below for details.  Patient initially presented with epigastric abdominal pain that started in Apr 2018. Patient was previously self medicating with Aleve and Naprosyn or chronic pains. She did not have any melena or hematochezia. Due to the intensity of the pain, patient underwent EGD on 11/29/16 that demonstrates presence of H. pylori-associated gastritis. At the time of presentation, family reported weight loss of 60 pounds over 3-4 month period. Previously, patient has been habitually drinking alcohol up to 6 beverages per day, quit drinking mid-May 2018. Continued to have abdominal pain and underwent imaging assessment with CT of the abdomen and pelvis on 12/16/16 did demonstrate presence of a mass lesion with spinal cord compression at level of T8. Compression was relieved surgically. She has been recovering well from surgery.  Patient presents to the clinic today  for the first day of the third cycle of systemic induction chemotherapy. In the interim, patient has been feeling increasingly fatigued, no fevers, chills, night sweats. She has increased amount of nausea and vomiting with food coming up. Denies abdominal pain, constipation, or diarrhea. No dysuria or hematuria. No shortness of breath, chest pain, or cough.No progression or peripheral neuropathy.  Oncological/hematological History: --Labs, 11/22/16: Ca 9.5, Cr 0.86; WBC 5.6, Hgb 12.2, Plt 270;  --EGD, 11/29/16: Gastritis positive for H. pylori. Treated with omeprazole, clarithromycin, and amoxicillin x14d  --CT A/P, 12/16/16: Left T8 lytic lesion with soft tissue extension into the spinal canal with cord compression. Fatty liver measuring 19.6cm. 0.3cm lt lower pole renal calculus, no hydronephrosis;  --MRI T-spine, 12/17/16: An enhancing mass on the left side at T8 invading the canal and compressing the spinal cord.   Oncological/hematological History:   Multiple myeloma not having achieved remission ([address])   12/26/2016 Initial Diagnosis    Multiple myeloma not having achieved remission [facility]): Originally presented with a mass resulting in compression of the spinal cord. Underwent surgery on 12/26/16 for decompression. --Initial Surgical Pathology: Plasma cell neoplasm involvement, IHC -- positive for CD138, CD79a, CD56, LCA, CD43, kappa light chains & negative for CD20, lambda light chains; --Baseline labs, 12/30/16: tProt 7.4, Alb 2.9, Ca 9.1, Cr 0.96, AP 77; SPEP/SIFE -- M-Spike 1.0g/dL, monoclonal IgG kappa present; IgA 51, IgG 1477, IgM 17; kappa 77.6, lambda 7.4, KLR 10.35; LDH 161, beta-2 microglobulin 2.3; WBC 10.8, Hgb 11.2, Plt 298;      01/16/2017 Imaging    PET-CT: Positive pathological uptake in T9 and proximal right tibia. No other hypermetabolic disease      09/20/2861 Pathology Results  Bone Marrow Biopsy: Hypercellular bone marrow with 16% involvement by plasma cell  process FISH: positive for [id number] loss, 13q34 loss, negative for del p53      01/30/2017 -  Chemotherapy    RVd: Lenalidomide '25mg'$  PO QDay, d1-14 + bortezomib 1.'3mg'$ /m2 d1,8,15 + low-dose dexamethasone '40mg'$  PO QWk Q21d --Cycle #1, 01/30/17: Complete located by grade 1 nausea --Cycle #2, 56/21/30: complicated by grade 2 nausea, grade 1 fatigue --Cycle #3, 03/13/17:        Medical History: Past Medical History:  Diagnosis Date  . Arthritis    knees  . Carpal tunnel syndrome   . CHF (congestive heart failure) ([place])   . Diabetes mellitus    type 2  . GERD (gastroesophageal reflux disease)   . Heart murmur    "Little, No concerns" per Dr  Dannielle Burn pt reported.  . Hypertension    controlled using a guided approch with plasma renin activity  . Neuropathy   . Peripheral vascular disease ([place])   . Sleep apnea    Uses CPAP    Surgical History: Past Surgical History:  Procedure Laterality Date  . BIOPSY  11/28/2016   Procedure: BIOPSY;  Surgeon: Rogene Houston, MD;  [facility];  Service: Endoscopy;;  gastric  . [place]  . CERVICAL CONE BIOPSY     cervical lesion  . ESOPHAGOGASTRODUODENOSCOPY (EGD) WITH PROPOFOL N/A 11/28/2016   Procedure: ESOPHAGOGASTRODUODENOSCOPY (EGD) WITH PROPOFOL;  Surgeon: Rogene Houston, MD;  [facility];  Service: Endoscopy;  Laterality: N/A;  9:25  . IR FLUORO GUIDE PORT INSERTION RIGHT  01/29/2017  . IR US GUIDE VASC ACCESS RIGHT  01/29/2017  . lipoma removal     right shoulder 2001  . MULTIPLE EXTRACTIONS WITH ALVEOLOPLASTY Bilateral 08/24/2012   Procedure: MULTIPLE EXTRACTION WITH ALVEOLOPLASTY BIOPSY OF RIGHT AND LEFT MANDIBLE ;  Surgeon: Gae Bon, DDS;  Location: [facility];  Service: Oral Surgery;  Laterality: Bilateral;  . POSTERIOR LUMBAR FUSION 4 LEVEL N/A 12/26/2016   Procedure: THORACIC EIGHT TUMOR RESECTION, THORACIC SIX- THORACIC TEN POSTERIOR SPINAL FUSION;  Surgeon: Ashok Pall, MD;  Location: [facility];   Service: Neurosurgery;  Laterality: N/A;  THORACIC 8 TUMOR RESECTION, THORACIC 6- THORACIC 10 POSTERIOR SPINAL FUSION  . ROTATOR CUFF REPAIR     right shoulder  . TUBAL LIGATION      Family History: Family History  Problem Relation Age of Onset  . Diabetes Mother   . Hypertension Mother   . Heart disease Mother   . Diabetes Father   . Heart disease Sister   . Diabetes Sister     Social History: Social History   Social History  . Marital status: Single    Spouse name: N/A  . Number of children: N/A  . Years of education: N/A   Occupational History  . Not on file.   Social History Main Topics  . Smoking status: Former Smoker    Packs/day: 0.50    Years: 6.00    Types: Cigarettes    Quit date: 11/23/2011  . Smokeless tobacco: Never Used     Comment: quit summer 2013  . Alcohol use No  . Drug use: No  . Sexual activity: Not Currently    Birth control/ protection: Post-menopausal   Other Topics Concern  . Not on file   Social History Narrative  . No narrative on file    Allergies: Allergies  Allergen Reactions  . No Known Allergies  Medications:  Current Outpatient Prescriptions  Medication Sig Dispense Refill  . acyclovir (ZOVIRAX) 400 MG tablet Take 1 tablet (400 mg total) by mouth 2 (two) times daily. 60 tablet 3  . chlorthalidone (HYGROTON) 25 MG tablet Take 25 mg by mouth 2 (two) times daily.   11  . dexamethasone (DECADRON) 4 MG tablet Take 10 tablets (40 mg) on days 1, 8, and 15 of chemo. Repeat every 21 days. 30 tablet 3  . diltiazem (CARDIZEM CD) 300 MG 24 hr capsule Take 347ms by mouth daily  3  . enalapril (VASOTEC) 20 MG tablet Take 20 mg by mouth 2 (two) times daily.     .Marland Kitchengabapentin (NEURONTIN) 300 MG capsule Take 1 capsule (300 mg total) by mouth 3 (three) times daily. 63 capsule 3  . insulin degludec (TRESIBA FLEXTOUCH) 100 UNIT/ML SOPN FlexTouch Pen Inject 0.5 mLs (50 Units total) into the skin daily at 10 pm. 5 pen 2  . Insulin Pen  Needle (B-D ULTRAFINE III SHORT PEN) 31G X 8 MM MISC 1 each by Does not apply route as directed. 100 each 3  . lenalidomide (REVLIMID) 25 MG capsule Take one capsule (282m by mouth once daily on days 1-14 every 21 days. Auth# [id number] 14 capsule 0  . lidocaine-prilocaine (EMLA) cream Apply to affected area once 30 g 3  . LORazepam (ATIVAN) 0.5 MG tablet Take 1 tablet (0.5 mg total) by mouth every 6 (six) hours as needed (Nausea or vomiting). 30 tablet 0  . metFORMIN (GLUCOPHAGE) 1000 MG tablet Take 1,000 mg by mouth 2 (two) times daily.     . nicotine (NICODERM CQ - DOSED IN MG/24 HOURS) 14 mg/24hr patch Place 1 patch (14 mg total) onto the skin daily. 28 patch 0  . nicotine polacrilex (COMMIT) 2 MG lozenge Take 1 lozenge (2 mg total) by mouth as needed for smoking cessation. 100 tablet 0  . ondansetron (ZOFRAN) 8 MG tablet Take 1 tablet (8 mg total) by mouth 2 (two) times daily as needed (Nausea or vomiting). (Patient not taking: Reported on 01/30/2017) 30 tablet 1  . oxyCODONE (OXY IR/ROXICODONE) 5 MG immediate release tablet Take 1 tablet (5 mg total) by mouth every 4 (four) hours as needed for severe pain. 30 tablet 0  . prochlorperazine (COMPAZINE) 10 MG tablet Take 1 tablet (10 mg total) by mouth every 6 (six) hours as needed (Nausea or vomiting). 30 tablet 1  . rivaroxaban (XARELTO) 10 MG TABS tablet Take 1 tablet (10 mg total) by mouth daily. 30 tablet 3  . tiZANidine (ZANAFLEX) 4 MG tablet Take 1 tablet (4 mg total) by mouth every 6 (six) hours as needed for muscle spasms. 60 tablet 0   No current facility-administered medications for this visit.     Review of Systems: Review of Systems  Gastrointestinal: Positive for nausea.  Musculoskeletal: Positive for back pain and gait problem.  Neurological: Positive for extremity weakness, gait problem and numbness.  All other systems reviewed and are negative.    PHYSICAL EXAMINATION Blood pressure 104/62, pulse 87, temperature 98.4  F (36.9 C), temperature source Oral, resp. rate 19, SpO2 100 %.  ECOG PERFORMANCE STATUS: 2 - Symptomatic, <50% confined to bed  Physical Exam  Constitutional: She is oriented to person, place, and time and well-developed, well-nourished, and in no distress.  HENT:  Head: Normocephalic and atraumatic.  Mouth/Throat: Oropharynx is clear and moist. No oropharyngeal exudate.  Eyes: Pupils are equal, round, and reactive to light. EOM are  normal. No scleral icterus.  Neck: No thyromegaly present.  Cardiovascular: Normal rate, regular rhythm and normal heart sounds.  Exam reveals no gallop.   No murmur heard. Pulmonary/Chest: Effort normal and breath sounds normal. She has no wheezes.  Abdominal: Soft. Bowel sounds are normal. She exhibits no distension and no mass. There is no tenderness. There is no rebound.  Musculoskeletal: Normal range of motion. She exhibits no edema.       Right shoulder: She exhibits no tenderness, no bony tenderness and no swelling.  Neurological: She is alert and oriented to person, place, and time.  Patient ambulates with difficulty and uses a walker.     LABORATORY DATA: I have personally reviewed the data as listed: Appointment on 03/13/2017  Component Date Value Ref Range Status  . WBC 03/13/2017 5.4  3.9 - 10.3 10e3/uL Final  . NEUT# 03/13/2017 3.2  1.5 - 6.5 10e3/uL Final  . HGB 03/13/2017 10.6* 11.6 - 15.9 g/dL Final  . HCT 03/13/2017 31.8* 34.8 - 46.6 % Final  . Platelets 03/13/2017 229  145 - 400 10e3/uL Final  . MCV 03/13/2017 91.4  79.5 - 101.0 fL Final  . MCH 03/13/2017 30.5  25.1 - 34.0 pg Final  . MCHC 03/13/2017 33.3  31.5 - 36.0 g/dL Final  . RBC 03/13/2017 3.48* 3.70 - 5.45 10e6/uL Final  . RDW 03/13/2017 14.4  11.2 - 14.5 % Final  . lymph# 03/13/2017 1.4  0.9 - 3.3 10e3/uL Final  . MONO# 03/13/2017 0.4  0.1 - 0.9 10e3/uL Final  . Eosinophils Absolute 03/13/2017 0.3  0.0 - 0.5 10e3/uL Final  . Basophils Absolute 03/13/2017 0.0  0.0 - 0.1  10e3/uL Final  . NEUT% 03/13/2017 59.2  38.4 - 76.8 % Final  . LYMPH% 03/13/2017 26.4  14.0 - 49.7 % Final  . MONO% 03/13/2017 7.6  0.0 - 14.0 % Final  . EOS% 03/13/2017 6.1  0.0 - 7.0 % Final  . BASO% 03/13/2017 0.7  0.0 - 2.0 % Final  . Sodium 03/13/2017 137  136 - 145 mEq/L Final  . Potassium 03/13/2017 3.6  3.5 - 5.1 mEq/L Final  . Chloride 03/13/2017 103  98 - 109 mEq/L Final  . CO2 03/13/2017 25  22 - 29 mEq/L Final  . Glucose 03/13/2017 326* 70 - 140 mg/dl Final   Glucose reference range is for nonfasting patients. Fasting glucose reference range is 70- 100.  Marland Kitchen BUN 03/13/2017 11.2  7.0 - 26.0 mg/dL Final  . Creatinine 03/13/2017 1.1  0.6 - 1.1 mg/dL Final  . Total Bilirubin 03/13/2017 0.34  0.20 - 1.20 mg/dL Final  . Alkaline Phosphatase 03/13/2017 98  40 - 150 U/L Final  . AST 03/13/2017 9  5 - 34 U/L Final  . ALT 03/13/2017 10  0 - 55 U/L Final  . Total Protein 03/13/2017 6.8  6.4 - 8.3 g/dL Final  . Albumin 03/13/2017 3.1* 3.5 - 5.0 g/dL Final  . Calcium 03/13/2017 9.4  8.4 - 10.4 mg/dL Final  . Anion Gap 03/13/2017 10  3 - 11 mEq/L Final  . EGFR 03/13/2017 66* >90 ml/min/1.73 m2 Final   eGFR is calculated using the CKD-EPI Creatinine Equation (2009)  . LDH 03/13/2017 160  125 - 245 U/L Final  . Ig Kappa Free Light Chain 03/13/2017 16.8  3.3 - 19.4 mg/L Final  . Ig Lambda Free Light Chain 03/13/2017 17.7  5.7 - 26.3 mg/L Final  . Kappa/Lambda FluidC Ratio 03/13/2017 0.95  0.26 - 1.65 Final  .  Total Protein 03/13/2017 6.1  6.0 - 8.5 g/dL Final  . Albumin 03/13/2017 3.0  2.9 - 4.4 g/dL Final  . Alpha 1 03/13/2017 0.3  0.0 - 0.4 g/dL Final  . Alpha 2 03/13/2017 1.0  0.4 - 1.0 g/dL Final  . Beta 03/13/2017 1.0  0.7 - 1.3 g/dL Final  . Gamma Globulin 03/13/2017 0.9  0.4 - 1.8 g/dL Final  . M-Spike, % 03/13/2017 0.4* Not Observed g/dL Final  . GLOBULIN, TOTAL 03/13/2017 3.1  2.2 - 3.9 g/dL Final  . A/G Ratio 03/13/2017 1.0  0.7 - 1.7 Final  . Please Note: 03/13/2017 Comment    Final   Comment: Protein electrophoresis scan will follow via computer, mail, or courier delivery.   . P E Interpretation, S 03/13/2017 Comment   Final   Comment: The SPE pattern demonstrates a single peak (M-spike) in the gamma region which may represent monoclonal protein. This peak may also be caused by circulating immune complexes, cryoglobulins, C-reactive protein, fibrinogen or hemolysis.  If clinically indicated, the presence of a monoclonal gammopathy may be confirmed by immuno- fixation, as well as an evaluation of the urine for the presence of Bence-Jones protein.   . IgG, Qn, Serum 03/13/2017 844  700 - 1,600 mg/dL Final  . IgA, Qn, Serum 03/13/2017 109  87 - 352 mg/dL Final  . IgM, Qn, Serum 03/13/2017 36  26 - 217 mg/dL Final       [name], MD

## 2017-03-20 ENCOUNTER — Other Ambulatory Visit (HOSPITAL_BASED_OUTPATIENT_CLINIC_OR_DEPARTMENT_OTHER): Payer: Medicare Other

## 2017-03-20 ENCOUNTER — Ambulatory Visit (HOSPITAL_BASED_OUTPATIENT_CLINIC_OR_DEPARTMENT_OTHER): Payer: Medicare Other

## 2017-03-20 ENCOUNTER — Telehealth: Payer: Self-pay

## 2017-03-20 VITALS — BP 124/79 | HR 84 | Temp 99.1°F | Resp 17

## 2017-03-20 DIAGNOSIS — C9000 Multiple myeloma not having achieved remission: Secondary | ICD-10-CM

## 2017-03-20 DIAGNOSIS — Z5112 Encounter for antineoplastic immunotherapy: Secondary | ICD-10-CM | POA: Diagnosis not present

## 2017-03-20 DIAGNOSIS — Z95828 Presence of other vascular implants and grafts: Secondary | ICD-10-CM

## 2017-03-20 LAB — BASIC METABOLIC PANEL
ANION GAP: 9 meq/L (ref 3–11)
BUN: 11.3 mg/dL (ref 7.0–26.0)
CALCIUM: 9.6 mg/dL (ref 8.4–10.4)
CO2: 24 meq/L (ref 22–29)
CREATININE: 0.9 mg/dL (ref 0.6–1.1)
Chloride: 102 mEq/L (ref 98–109)
EGFR: 79 mL/min/{1.73_m2} — AB (ref >90)
Glucose: 264 mg/dl — ABNORMAL HIGH (ref 70–140)
Potassium: 3.8 mEq/L (ref 3.5–5.1)
SODIUM: 135 meq/L — AB (ref 136–145)

## 2017-03-20 MED ORDER — PROCHLORPERAZINE MALEATE 10 MG PO TABS
ORAL_TABLET | ORAL | Status: AC
  Filled 2017-03-20: qty 1

## 2017-03-20 MED ORDER — SODIUM CHLORIDE 0.9% FLUSH
10.0000 mL | INTRAVENOUS | Status: DC | PRN
  Administered 2017-03-20: 10 mL via INTRAVENOUS
  Filled 2017-03-20: qty 10

## 2017-03-20 MED ORDER — BORTEZOMIB CHEMO SQ INJECTION 3.5 MG (2.5MG/ML)
1.3000 mg/m2 | Freq: Once | INTRAMUSCULAR | Status: AC
  Administered 2017-03-20: 2.75 mg via SUBCUTANEOUS
  Filled 2017-03-20: qty 2.75

## 2017-03-20 MED ORDER — PROCHLORPERAZINE MALEATE 10 MG PO TABS
10.0000 mg | ORAL_TABLET | Freq: Once | ORAL | Status: AC
  Administered 2017-03-20: 10 mg via ORAL

## 2017-03-20 MED ORDER — HEPARIN SOD (PORK) LOCK FLUSH 100 UNIT/ML IV SOLN
500.0000 [IU] | Freq: Once | INTRAVENOUS | Status: AC
  Administered 2017-03-20: 500 [IU] via INTRAVENOUS
  Filled 2017-03-20: qty 5

## 2017-03-20 NOTE — Progress Notes (Signed)
Per Dr Lebron Conners, ok to use CBC from 03/13/2017 for tx today.  Glucose today is 264, pt asymptomatic.

## 2017-03-20 NOTE — Patient Instructions (Signed)
[facility] Discharge Instructions for Patients Receiving Chemotherapy  Today you received the following chemotherapy agents bortezomib (Velcade)  To help prevent nausea and vomiting after your treatment, we encourage you to take your nausea medication as directed by your MD.  If you develop nausea and vomiting that is not controlled by your nausea medication, call the clinic.   BELOW ARE SYMPTOMS THAT SHOULD BE REPORTED IMMEDIATELY:  *FEVER GREATER THAN 100.5 F  *CHILLS WITH OR WITHOUT FEVER  NAUSEA AND VOMITING THAT IS NOT CONTROLLED WITH YOUR NAUSEA MEDICATION  *UNUSUAL SHORTNESS OF BREATH  *UNUSUAL BRUISING OR BLEEDING  TENDERNESS IN MOUTH AND THROAT WITH OR WITHOUT PRESENCE OF ULCERS  *URINARY PROBLEMS  *BOWEL PROBLEMS  UNUSUAL RASH Items with * indicate a potential emergency and should be followed up as soon as possible.  Feel free to call the clinic you have any questions or concerns. The clinic phone number is [phone].  Please show the [facility] at check-in to the Emergency Department and triage nurse.  Zoledronic Acid injection (Hypercalcemia, Oncology) What is this medicine? ZOLEDRONIC ACID (ZOE le dron ik AS id) lowers the amount of calcium loss from bone. It is used to treat too much calcium in your blood from cancer. It is also used to prevent complications of cancer that has spread to the bone. This medicine may be used for other purposes; ask your health care provider or pharmacist if you have questions. COMMON BRAND NAME(S): Zometa What should I tell my health care provider before I take this medicine? They need to know if you have any of these conditions: -aspirin-sensitive asthma -cancer, especially if you are receiving medicines used to treat cancer -dental disease or wear dentures -infection -kidney disease -receiving corticosteroids like dexamethasone or prednisone -an unusual or allergic reaction to zoledronic acid,  other medicines, foods, dyes, or preservatives -pregnant or trying to get pregnant -breast-feeding How should I use this medicine? This medicine is for infusion into a vein. It is given by a health care professional in a hospital or clinic setting. Talk to your pediatrician regarding the use of this medicine in children. Special care may be needed. Overdosage: If you think you have taken too much of this medicine contact a poison control center or emergency room at once. NOTE: This medicine is only for you. Do not share this medicine with others. What if I miss a dose? It is important not to miss your dose. Call your doctor or health care professional if you are unable to keep an appointment. What may interact with this medicine? -certain antibiotics given by injection -NSAIDs, medicines for pain and inflammation, like ibuprofen or naproxen -some diuretics like bumetanide, furosemide -teriparatide -thalidomide This list may not describe all possible interactions. Give your health care provider a list of all the medicines, herbs, non-prescription drugs, or dietary supplements you use. Also tell them if you smoke, drink alcohol, or use illegal drugs. Some items may interact with your medicine. What should I watch for while using this medicine? Visit your doctor or health care professional for regular checkups. It may be some time before you see the benefit from this medicine. Do not stop taking your medicine unless your doctor tells you to. Your doctor may order blood tests or other tests to see how you are doing. Women should inform their doctor if they wish to become pregnant or think they might be pregnant. There is a potential for serious side effects to an unborn child.  Talk to your health care professional or pharmacist for more information. You should make sure that you get enough calcium and vitamin D while you are taking this medicine. Discuss the foods you eat and the vitamins you take  with your health care professional. Some people who take this medicine have severe bone, joint, and/or muscle pain. This medicine may also increase your risk for jaw problems or a broken thigh bone. Tell your doctor right away if you have severe pain in your jaw, bones, joints, or muscles. Tell your doctor if you have any pain that does not go away or that gets worse. Tell your dentist and dental surgeon that you are taking this medicine. You should not have major dental surgery while on this medicine. See your dentist to have a dental exam and fix any dental problems before starting this medicine. Take good care of your teeth while on this medicine. Make sure you see your dentist for regular follow-up appointments. What side effects may I notice from receiving this medicine? Side effects that you should report to your doctor or health care professional as soon as possible: -allergic reactions like skin rash, itching or hives, swelling of the face, lips, or tongue -anxiety, confusion, or depression -breathing problems -changes in vision -eye pain -feeling faint or lightheaded, falls -jaw pain, especially after dental work -mouth sores -muscle cramps, stiffness, or weakness -redness, blistering, peeling or loosening of the skin, including inside the mouth -trouble passing urine or change in the amount of urine Side effects that usually do not require medical attention (report to your doctor or health care professional if they continue or are bothersome): -bone, joint, or muscle pain -constipation -diarrhea -fever -hair loss -irritation at site where injected -loss of appetite -nausea, vomiting -stomach upset -trouble sleeping -trouble swallowing -weak or tired This list may not describe all possible side effects. Call your doctor for medical advice about side effects. You may report side effects to FDA at 1-800-FDA-1088. Where should I keep my medicine? This drug is given in a hospital  or clinic and will not be stored at home. NOTE: This sheet is a summary. It may not cover all possible information. If you have questions about this medicine, talk to your doctor, pharmacist, or health care provider.  2018 Elsevier/Gold Standard (2013-10-30 14:19:39)

## 2017-03-20 NOTE — Telephone Encounter (Signed)
Scheduling message sent to have flush appointments added prior to each infusion appointment and for pt to be called with any time changes of appointments preceding or following flush.

## 2017-03-21 ENCOUNTER — Telehealth: Payer: Self-pay | Admitting: Hematology and Oncology

## 2017-03-21 NOTE — Telephone Encounter (Signed)
Spoke with patient regarding appts that were added per 10/4 sch msg.

## 2017-03-26 ENCOUNTER — Other Ambulatory Visit: Payer: Self-pay

## 2017-03-26 DIAGNOSIS — C9000 Multiple myeloma not having achieved remission: Secondary | ICD-10-CM

## 2017-03-27 ENCOUNTER — Other Ambulatory Visit: Payer: Medicare Other

## 2017-03-27 ENCOUNTER — Ambulatory Visit (HOSPITAL_BASED_OUTPATIENT_CLINIC_OR_DEPARTMENT_OTHER): Payer: Medicare Other

## 2017-03-27 ENCOUNTER — Other Ambulatory Visit (HOSPITAL_BASED_OUTPATIENT_CLINIC_OR_DEPARTMENT_OTHER): Payer: Medicare Other

## 2017-03-27 VITALS — BP 115/73 | HR 92 | Temp 99.0°F

## 2017-03-27 DIAGNOSIS — C9000 Multiple myeloma not having achieved remission: Secondary | ICD-10-CM

## 2017-03-27 DIAGNOSIS — Z5112 Encounter for antineoplastic immunotherapy: Secondary | ICD-10-CM

## 2017-03-27 LAB — CBC WITH DIFFERENTIAL/PLATELET
BASO%: 1 % (ref 0.0–2.0)
Basophils Absolute: 0 10*3/uL (ref 0.0–0.1)
EOS ABS: 0.2 10*3/uL (ref 0.0–0.5)
EOS%: 3.5 % (ref 0.0–7.0)
HCT: 31.8 % — ABNORMAL LOW (ref 34.8–46.6)
HEMOGLOBIN: 10.7 g/dL — AB (ref 11.6–15.9)
LYMPH%: 25 % (ref 14.0–49.7)
MCH: 30.8 pg (ref 25.1–34.0)
MCHC: 33.8 g/dL (ref 31.5–36.0)
MCV: 91.4 fL (ref 79.5–101.0)
MONO#: 0.2 10*3/uL (ref 0.1–0.9)
MONO%: 4 % (ref 0.0–14.0)
NEUT%: 66.5 % (ref 38.4–76.8)
NEUTROS ABS: 3.2 10*3/uL (ref 1.5–6.5)
Platelets: 221 10*3/uL (ref 145–400)
RBC: 3.48 10*6/uL — AB (ref 3.70–5.45)
RDW: 16.1 % — AB (ref 11.2–14.5)
WBC: 4.9 10*3/uL (ref 3.9–10.3)
lymph#: 1.2 10*3/uL (ref 0.9–3.3)

## 2017-03-27 LAB — COMPREHENSIVE METABOLIC PANEL
ALT: 11 U/L (ref 0–55)
ANION GAP: 11 meq/L (ref 3–11)
AST: 11 U/L (ref 5–34)
Albumin: 3.4 g/dL — ABNORMAL LOW (ref 3.5–5.0)
Alkaline Phosphatase: 84 U/L (ref 40–150)
BUN: 12.4 mg/dL (ref 7.0–26.0)
CALCIUM: 9.2 mg/dL (ref 8.4–10.4)
CHLORIDE: 103 meq/L (ref 98–109)
CO2: 23 meq/L (ref 22–29)
CREATININE: 0.9 mg/dL (ref 0.6–1.1)
Glucose: 176 mg/dl — ABNORMAL HIGH (ref 70–140)
POTASSIUM: 3.6 meq/L (ref 3.5–5.1)
Sodium: 138 mEq/L (ref 136–145)
Total Bilirubin: 0.3 mg/dL (ref 0.20–1.20)
Total Protein: 6.7 g/dL (ref 6.4–8.3)

## 2017-03-27 MED ORDER — PROCHLORPERAZINE MALEATE 10 MG PO TABS
ORAL_TABLET | ORAL | Status: AC
  Filled 2017-03-27: qty 1

## 2017-03-27 MED ORDER — PROCHLORPERAZINE MALEATE 10 MG PO TABS
10.0000 mg | ORAL_TABLET | Freq: Once | ORAL | Status: AC
  Administered 2017-03-27: 10 mg via ORAL

## 2017-03-27 MED ORDER — BORTEZOMIB CHEMO SQ INJECTION 3.5 MG (2.5MG/ML)
1.3000 mg/m2 | Freq: Once | INTRAMUSCULAR | Status: AC
  Administered 2017-03-27: 2.75 mg via SUBCUTANEOUS
  Filled 2017-03-27: qty 2.75

## 2017-03-27 NOTE — Patient Instructions (Signed)
[facility] Discharge Instructions for Patients Receiving Chemotherapy  Today you received the following chemotherapy agents bortezomib (Velcade)  To help prevent nausea and vomiting after your treatment, we encourage you to take your nausea medication as directed by your MD.  If you develop nausea and vomiting that is not controlled by your nausea medication, call the clinic.   BELOW ARE SYMPTOMS THAT SHOULD BE REPORTED IMMEDIATELY:  *FEVER GREATER THAN 100.5 F  *CHILLS WITH OR WITHOUT FEVER  NAUSEA AND VOMITING THAT IS NOT CONTROLLED WITH YOUR NAUSEA MEDICATION  *UNUSUAL SHORTNESS OF BREATH  *UNUSUAL BRUISING OR BLEEDING  TENDERNESS IN MOUTH AND THROAT WITH OR WITHOUT PRESENCE OF ULCERS  *URINARY PROBLEMS  *BOWEL PROBLEMS  UNUSUAL RASH Items with * indicate a potential emergency and should be followed up as soon as possible.  Feel free to call the clinic you have any questions or concerns. The clinic phone number is [phone].  Please show the [facility] at check-in to the Emergency Department and triage nurse.

## 2017-03-28 DIAGNOSIS — E7849 Other hyperlipidemia: Secondary | ICD-10-CM | POA: Diagnosis not present

## 2017-03-28 DIAGNOSIS — C9001 Multiple myeloma in remission: Secondary | ICD-10-CM | POA: Diagnosis not present

## 2017-03-28 DIAGNOSIS — J449 Chronic obstructive pulmonary disease, unspecified: Secondary | ICD-10-CM | POA: Diagnosis not present

## 2017-03-28 DIAGNOSIS — I10 Essential (primary) hypertension: Secondary | ICD-10-CM | POA: Diagnosis not present

## 2017-03-28 DIAGNOSIS — I82591 Chronic embolism and thrombosis of other specified deep vein of right lower extremity: Secondary | ICD-10-CM | POA: Diagnosis not present

## 2017-03-28 DIAGNOSIS — E0865 Diabetes mellitus due to underlying condition with hyperglycemia: Secondary | ICD-10-CM | POA: Diagnosis not present

## 2017-04-03 ENCOUNTER — Ambulatory Visit (HOSPITAL_BASED_OUTPATIENT_CLINIC_OR_DEPARTMENT_OTHER): Payer: Medicare Other | Admitting: Hematology and Oncology

## 2017-04-03 ENCOUNTER — Encounter: Payer: Self-pay | Admitting: Hematology and Oncology

## 2017-04-03 ENCOUNTER — Ambulatory Visit: Payer: Medicare Other

## 2017-04-03 ENCOUNTER — Other Ambulatory Visit: Payer: Self-pay

## 2017-04-03 ENCOUNTER — Other Ambulatory Visit (HOSPITAL_BASED_OUTPATIENT_CLINIC_OR_DEPARTMENT_OTHER): Payer: Medicare Other

## 2017-04-03 ENCOUNTER — Ambulatory Visit (HOSPITAL_BASED_OUTPATIENT_CLINIC_OR_DEPARTMENT_OTHER): Payer: Medicare Other

## 2017-04-03 ENCOUNTER — Telehealth: Payer: Self-pay | Admitting: Hematology and Oncology

## 2017-04-03 VITALS — BP 109/68 | HR 80 | Temp 98.7°F | Resp 16 | Wt 211.0 lb

## 2017-04-03 DIAGNOSIS — Z5111 Encounter for antineoplastic chemotherapy: Secondary | ICD-10-CM

## 2017-04-03 DIAGNOSIS — G629 Polyneuropathy, unspecified: Secondary | ICD-10-CM

## 2017-04-03 DIAGNOSIS — C9000 Multiple myeloma not having achieved remission: Secondary | ICD-10-CM

## 2017-04-03 DIAGNOSIS — Z5112 Encounter for antineoplastic immunotherapy: Secondary | ICD-10-CM

## 2017-04-03 DIAGNOSIS — R531 Weakness: Secondary | ICD-10-CM

## 2017-04-03 LAB — COMPREHENSIVE METABOLIC PANEL
ALK PHOS: 75 U/L (ref 40–150)
ALT: 11 U/L (ref 0–55)
ANION GAP: 8 meq/L (ref 3–11)
AST: 10 U/L (ref 5–34)
Albumin: 3.4 g/dL — ABNORMAL LOW (ref 3.5–5.0)
BILIRUBIN TOTAL: 0.38 mg/dL (ref 0.20–1.20)
BUN: 8.4 mg/dL (ref 7.0–26.0)
CO2: 24 mEq/L (ref 22–29)
Calcium: 8.6 mg/dL (ref 8.4–10.4)
Chloride: 107 mEq/L (ref 98–109)
Creatinine: 0.9 mg/dL (ref 0.6–1.1)
Glucose: 131 mg/dl (ref 70–140)
POTASSIUM: 3.7 meq/L (ref 3.5–5.1)
Sodium: 140 mEq/L (ref 136–145)
TOTAL PROTEIN: 6.5 g/dL (ref 6.4–8.3)

## 2017-04-03 LAB — CBC WITH DIFFERENTIAL/PLATELET
BASO%: 0.9 % (ref 0.0–2.0)
BASOS ABS: 0 10*3/uL (ref 0.0–0.1)
EOS ABS: 0.5 10*3/uL (ref 0.0–0.5)
EOS%: 11.7 % — ABNORMAL HIGH (ref 0.0–7.0)
HCT: 31.2 % — ABNORMAL LOW (ref 34.8–46.6)
HEMOGLOBIN: 10.3 g/dL — AB (ref 11.6–15.9)
LYMPH%: 29.6 % (ref 14.0–49.7)
MCH: 30.4 pg (ref 25.1–34.0)
MCHC: 33 g/dL (ref 31.5–36.0)
MCV: 92 fL (ref 79.5–101.0)
MONO#: 0.4 10*3/uL (ref 0.1–0.9)
MONO%: 8.6 % (ref 0.0–14.0)
NEUT%: 49.2 % (ref 38.4–76.8)
NEUTROS ABS: 2.1 10*3/uL (ref 1.5–6.5)
PLATELETS: 231 10*3/uL (ref 145–400)
RBC: 3.39 10*6/uL — AB (ref 3.70–5.45)
RDW: 15 % — ABNORMAL HIGH (ref 11.2–14.5)
WBC: 4.3 10*3/uL (ref 3.9–10.3)
lymph#: 1.3 10*3/uL (ref 0.9–3.3)

## 2017-04-03 LAB — MAGNESIUM: MAGNESIUM: 1.7 mg/dL (ref 1.5–2.5)

## 2017-04-03 MED ORDER — BORTEZOMIB CHEMO SQ INJECTION 3.5 MG (2.5MG/ML)
1.0000 mg/m2 | Freq: Once | INTRAMUSCULAR | Status: AC
  Administered 2017-04-03: 2.25 mg via SUBCUTANEOUS
  Filled 2017-04-03: qty 2.25

## 2017-04-03 MED ORDER — SODIUM CHLORIDE 0.9% FLUSH
10.0000 mL | INTRAVENOUS | Status: DC | PRN
  Administered 2017-04-03: 10 mL via INTRAVENOUS
  Filled 2017-04-03: qty 10

## 2017-04-03 MED ORDER — PROCHLORPERAZINE MALEATE 10 MG PO TABS
10.0000 mg | ORAL_TABLET | Freq: Once | ORAL | Status: AC
  Administered 2017-04-03: 10 mg via ORAL

## 2017-04-03 MED ORDER — PROCHLORPERAZINE MALEATE 10 MG PO TABS
ORAL_TABLET | ORAL | Status: AC
  Filled 2017-04-03: qty 1

## 2017-04-03 NOTE — Telephone Encounter (Signed)
Gave avs and calendar for November  °

## 2017-04-03 NOTE — Patient Instructions (Signed)

## 2017-04-04 ENCOUNTER — Telehealth: Payer: Self-pay

## 2017-04-04 ENCOUNTER — Other Ambulatory Visit: Payer: Self-pay

## 2017-04-04 DIAGNOSIS — C9000 Multiple myeloma not having achieved remission: Secondary | ICD-10-CM

## 2017-04-04 LAB — KAPPA/LAMBDA LIGHT CHAINS
IG KAPPA FREE LIGHT CHAIN: 18.5 mg/L (ref 3.3–19.4)
Ig Lambda Free Light Chain: 18 mg/L (ref 5.7–26.3)
Kappa/Lambda FluidC Ratio: 1.03 (ref 0.26–1.65)

## 2017-04-04 MED ORDER — LENALIDOMIDE 25 MG PO CAPS: ORAL_CAPSULE | ORAL | 0 refills | Status: DC

## 2017-04-04 NOTE — Telephone Encounter (Signed)
PT referral faxed to Edwinna Areola at [facility] on 04/03/17. Confirmatio of fax received.

## 2017-04-07 ENCOUNTER — Telehealth: Payer: Self-pay

## 2017-04-07 DIAGNOSIS — I509 Heart failure, unspecified: Secondary | ICD-10-CM | POA: Diagnosis not present

## 2017-04-07 DIAGNOSIS — I110 Hypertensive heart disease with heart failure: Secondary | ICD-10-CM | POA: Diagnosis not present

## 2017-04-07 DIAGNOSIS — C9000 Multiple myeloma not having achieved remission: Secondary | ICD-10-CM | POA: Diagnosis not present

## 2017-04-07 DIAGNOSIS — E1151 Type 2 diabetes mellitus with diabetic peripheral angiopathy without gangrene: Secondary | ICD-10-CM | POA: Diagnosis not present

## 2017-04-07 NOTE — Telephone Encounter (Signed)
Christine Garrett, PT with [facility] called and would like to see pt twice a week for four weeks at home. Pt is approved for this through Medicare, but there is an issue locating Dr. Lebron Conners on the [facility] Physician list. Email sent to leadership to determine the next best step to resolve this.

## 2017-04-08 ENCOUNTER — Telehealth: Payer: Self-pay

## 2017-04-08 LAB — MULTIPLE MYELOMA PANEL, SERUM
ALBUMIN/GLOB SERPL: 1.1 (ref 0.7–1.7)
Albumin SerPl Elph-Mcnc: 3.2 g/dL (ref 2.9–4.4)
Alpha 1: 0.3 g/dL (ref 0.0–0.4)
Alpha2 Glob SerPl Elph-Mcnc: 0.8 g/dL (ref 0.4–1.0)
B-GLOBULIN SERPL ELPH-MCNC: 1 g/dL (ref 0.7–1.3)
GAMMA GLOB SERPL ELPH-MCNC: 0.9 g/dL (ref 0.4–1.8)
Globulin, Total: 3 g/dL (ref 2.2–3.9)
IgA, Qn, Serum: 118 mg/dL (ref 87–352)
IgG, Qn, Serum: 748 mg/dL (ref 700–1600)
IgM, Qn, Serum: 31 mg/dL (ref 26–217)
M PROTEIN SERPL ELPH-MCNC: 0.4 g/dL — AB
TOTAL PROTEIN: 6.2 g/dL (ref 6.0–8.5)

## 2017-04-08 NOTE — Telephone Encounter (Signed)
Communication regarding Medicare query from [facility] passed to Ulice Dash, who will discuss with PT from [facility] to pinpoint the problem and come up with a resolution so that the patient can be seen.

## 2017-04-09 ENCOUNTER — Telehealth: Payer: Self-pay

## 2017-04-09 NOTE — Assessment & Plan Note (Signed)
61 y.o. female who initially presented with significant weight loss in the context of H. pylori-associated gastritis and alcohol abuse. At the same time, an incidental discovery of a lower thoracic vertebral mass in the context of chronic numbness and weakness in the lower extremities. MRI of the thoracic spine demonstrated a destructive lesion at T8 level and patient underwent surgical treatment. 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 Staging evaluation conducted by our clinic confirmed presence of IgG kappa multiple myeloma. Cytogenetics studies demonstrated normal karyotype, FISH studies are positive for 13q34 & D13S319 loss and no loss of p53. Based on initial information, patient is now staged at [place] [facility].   Patient was initiated on induction systemic chemotherapy with lenalidomide, bortezomib, and low-dose dexamethasone. Patient has received 3 cycles of therapy so far. Patient was supposed to undergo a bone marrow biopsy for disease response assessment at [facility], but biopsy has been postponed due to transportation difficulties.  Patient reports that the repeated biopsy is now scheduled sometime in mid- to late November.  Patient reports increasing weakness in bilateral lower extremities with confirmatory findings on the physical examination.  She does have great one neuropathy in toes and fingers.  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  Alternatively, persistent compression from the previous vertebral fracture may be the causative lesion.  Clinical evaluation lab work today are permissive to proceed with third cycle of treatment. bortezomib will be administered with those reduction down to 15m/m2 different suspicion for motor neuropathy contributing to lower extremity weakness.  Additionally, I do not want to delay staging bone marrow biopsy.  I would like  to demonstrate to the patient that the treatment is providing a cancer - specific response.  Plan: --Proceed with Cycle #4 of RVd + zoledronic acid  --Supportive care with Bactrim and acyclovir to reduce the risk of PCP and VZV infections respectively; at the last visit, patient was started on prophylactic dose of Rivaroxaban (Xarelto) due to superficial venous thrombosis and as a prophylaxis for thrombosis due to use of lenalidomide. --PET-CT for radiographic disease assessment --consul Interventional radiology for repeat bone marrow biopsy next week --Patient will return to our clinic in 3 weeks with lab work to review restaging studies and monitor treatment toxicity [facility] and the delegate stencil consolidation treatment.

## 2017-04-09 NOTE — Telephone Encounter (Signed)
Received VM from Verl Dicker, PT with [facility] [phone] at 1618 this afternoon. Requesting potential for physician who can sign orders for PT with this patient because she has Medicare. Continuing to work on this with Ulice Dash, [place]. Shared PT information with Ulice Dash. Provided f/u call to PT to let her know that we are working on it.

## 2017-04-09 NOTE — Progress Notes (Signed)
[facility] Follow-up Visit:  Assessment: Multiple myeloma not having achieved remission [facility]) 61 y.o. female who initially presented with significant weight loss in the context of H. pylori-associated gastritis and alcohol abuse. At the same time, an incidental discovery of a lower thoracic vertebral mass in the context of chronic numbness and weakness in the lower extremities. MRI of the thoracic spine demonstrated a destructive lesion at T8 level and patient underwent surgical treatment. 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 Staging evaluation conducted by our clinic confirmed presence of IgG kappa multiple myeloma. Cytogenetics studies demonstrated normal karyotype, FISH studies are positive for 13q34 & D13S319 loss and no loss of p53. Based on initial information, patient is now staged at [place] [facility].   Patient was initiated on induction systemic chemotherapy with lenalidomide, bortezomib, and low-dose dexamethasone. Patient has received 3 cycles of therapy so far. Patient was supposed to undergo a bone marrow biopsy for disease response assessment at [place], but biopsy has been postponed due to transportation difficulties.  Patient reports that the repeated biopsy is now scheduled sometime in mid- to late November.  Patient reports increasing weakness in bilateral lower extremities with confirmatory findings on the physical examination.  Christine Garrett does have great one neuropathy in toes and fingers.  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  Alternatively, persistent compression from the previous vertebral fracture may be the causative lesion.  Clinical evaluation lab work today are permissive to proceed with third cycle of treatment. bortezomib will be administered with those reduction down to 52m/m2 different suspicion for motor neuropathy  contributing to lower extremity weakness.  Additionally, I do not want to delay staging bone marrow biopsy.  I would like to demonstrate to the patient that the treatment is providing a cancer - specific response.  Plan: --Proceed with Cycle #4 of RVd + zoledronic acid  --Supportive care with Bactrim and acyclovir to reduce the risk of PCP and VZV infections respectively; at the last visit, patient was started on prophylactic dose of Rivaroxaban (Xarelto) due to superficial venous thrombosis and as a prophylaxis for thrombosis due to use of lenalidomide. --PET-CT for radiographic disease assessment --consul Interventional radiology for repeat bone marrow biopsy next week --Patient will return to our clinic in 3 weeks with lab work to review restaging studies and monitor treatment toxicity [facility] and the delegate stencil consolidation treatment.  Voice recognition software was used and creation of this note. Despite my best effort at editing the text, some misspelling/errors may have occurred.  Orders Placed This Encounter  Procedures  . NM PET Image Restage (PS) Whole Body    Standing Status:   Future    Standing Expiration Date:   04/03/2018    Order Specific Question:   If indicated for the ordered procedure, I authorize the administration of a radiopharmaceutical per Radiology protocol    Answer:   Yes    Order Specific Question:   Is the patient pregnant?    Answer:   No    Order Specific Question:   [facility]    [facility]   Order Specific Question:   Radiology Contrast Protocol - do NOT remove file path    Answer:   \\[email or web address]    Order Specific Question:   Reason for  Exam additional comments    Answer:   Multiple myeloma restaging s/p therapy    Order Specific Question:   Is patient pregnant?    Answer:   No  . CT Biopsy     Standing Status:   Future    Standing Expiration Date:   04/03/2018    Order Specific Question:   Lab orders requested (DO NOT place separate lab orders, these will be automatically ordered during procedure specimen collection):    Answer:   Cytology - Non Pap    Comments:   Cytogentics, FISH, Flow    Order Specific Question:   Lab orders requested (DO NOT place separate lab orders, these will be automatically ordered during procedure specimen collection):    Answer:   Surgical Pathology    Order Specific Question:   Lab orders requested (DO NOT place separate lab orders, these will be automatically ordered during procedure specimen collection):    Answer:   Other    Order Specific Question:   Reason for Exam (SYMPTOM  OR DIAGNOSIS REQUIRED)    Answer:   Multiple myeloma treatment effect assessment    Order Specific Question:   Is patient pregnant?    Answer:   No    Order Specific Question:   Preferred imaging location?    Answer:   [facility]    Order Specific Question:   Radiology Contrast Protocol - do NOT remove file path    Answer:   \\[email or web address].[email or web address]  . CT BONE MARROW BIOPSY & ASPIRATION    Standing Status:   Future    Standing Expiration Date:   07/04/2018    Order Specific Question:   Reason for Exam (SYMPTOM  OR DIAGNOSIS REQUIRED)    Answer:   MM treatment effect assessment    Order Specific Question:   Is patient pregnant?    Answer:   No    Order Specific Question:   Preferred imaging location?    Answer:   [facility]    Order Specific Question:   Radiology Contrast Protocol - do NOT remove file path    Answer:   \\[email or web address].[email or web address]  . CBC with Differential    Standing Status:   Future    Standing Expiration Date:   04/03/2018  . Comprehensive metabolic panel    Standing Status:   Future    Standing Expiration Date:   04/03/2018  . Magnesium    Standing Status:   Future    Standing Expiration Date:    04/03/2018  . Phosphorus    Standing Status:   Future    Standing Expiration Date:   04/03/2018  . Kappa/lambda light chains    Standing Status:   Future    Standing Expiration Date:   04/03/2018  . Multiple Myeloma Panel (SPEP&IFE w/QIG)    Standing Status:   Future    Standing Expiration Date:   04/03/2018  . 24-Hr Ur UPEP/UIFE/Light Chains/TP    Standing Status:   Future    Standing Expiration Date:   04/03/2018  . Ambulatory referral to Physical Therapy    Referral Priority:   Routine    Referral Type:   Physical Medicine    Referral Reason:   Specialty Services Required    Requested Specialty:   Physical Therapy    Number of Visits Requested:   1    Cancer Staging Multiple myeloma not having achieved remission ([place]) Staging form: Plasma Cell Myeloma and Plasma Cell Disorders, AJCC 8th Edition -  Clinical stage from 01/24/2017: RISS Stage II (Beta-2-microglobulin (mg/L): 2.3, Albumin (g/dL): 2.9, ISS: Stage II, High-risk cytogenetics: Absent, LDH: Normal) - Signed by [name], MD on 02/21/2017   All questions were answered.  . The patient knows to call the clinic with any problems, questions or concerns.  This note was electronically signed.    History of Presenting Illness Christine Garrett is a  61 y.o. female followed in the [facility] for diagnosis of active IgG kappa multiple myeloma. Please see oncologic history below for details.  Patient initially presented with epigastric abdominal pain that started in Apr 2018. Patient was previously self medicating with Aleve and Naprosyn or chronic pains. Christine Garrett did not have any melena or hematochezia. Due to the intensity of the pain, patient underwent EGD on 11/29/16 that demonstrates presence of H. pylori-associated gastritis. At the time of presentation, family reported weight loss of 60 pounds over 3-4 month period. Previously, patient has been habitually drinking alcohol up to 6 beverages per day, quit drinking  mid-May 2018. Continued to have abdominal pain and underwent imaging assessment with CT of the abdomen and pelvis on 12/16/16 did demonstrate presence of a mass lesion with spinal cord compression at level of T8. Compression was relieved surgically. Christine Garrett has been recovering well from surgery.  Patient presents to the clinic today for the first day of the third cycle of systemic induction chemotherapy. In the interim, patient has been feeling increasingly fatigued, no fevers, chills, night sweats. Christine Garrett has increased amount of nausea and vomiting with food coming up. Denies abdominal pain, constipation, or diarrhea. No dysuria or hematuria. No shortness of breath, chest pain, or cough.No progression or peripheral neuropathy.  Oncological/hematological History: --Labs, 11/22/16: Ca 9.5, Cr 0.86; WBC 5.6, Hgb 12.2, Plt 270;  --EGD, 11/29/16: Gastritis positive for H. pylori. Treated with omeprazole, clarithromycin, and amoxicillin x14d  --CT A/P, 12/16/16: Left T8 lytic lesion with soft tissue extension into the spinal canal with cord compression. Fatty liver measuring 19.6cm. 0.3cm lt lower pole renal calculus, no hydronephrosis;  --MRI T-spine, 12/17/16: An enhancing mass on the left side at T8 invading the canal and compressing the spinal cord.   Oncological/hematological History:   Multiple myeloma not having achieved remission ([place])   12/26/2016 Initial Diagnosis    Multiple myeloma not having achieved remission [facility]): Originally presented with a mass resulting in compression of the spinal cord. Underwent surgery on 12/26/16 for decompression. --Initial Surgical Pathology: Plasma cell neoplasm involvement, IHC -- positive for CD138, CD79a, CD56, LCA, CD43, kappa light chains & negative for CD20, lambda light chains; --Baseline labs, 12/30/16: tProt 7.4, Alb 2.9, Ca 9.1, Cr 0.96, AP 77; SPEP/SIFE -- M-Spike 1.0g/dL, monoclonal IgG kappa present; IgA 51, IgG 1477, IgM 17; kappa 77.6, lambda 7.4, KLR  10.35; LDH 161, beta-2 microglobulin 2.3; WBC 10.8, Hgb 11.2, Plt 298;      01/16/2017 Imaging    PET-CT: Positive pathological uptake in T9 and proximal right tibia. No other hypermetabolic disease      7/0/7867 Pathology Results    Bone Marrow Biopsy: Hypercellular bone marrow with 16% involvement by plasma cell process FISH: positive for [id number] loss, 13q34 loss, negative for del p53      01/30/2017 -  Chemotherapy    RVd: Lenalidomide 40m PO QDay, d1-14 + bortezomib 1.377mm2 d1,8,15 + low-dose dexamethasone 4017mO QWk Q21d --Cycle #1, 01/30/17: Complete located by grade 1 nausea --Cycle #2, 09/00/71/21omplicated by grade 2 nausea, grade 1 fatigue --Cycle #3, 09/97/58/83omplicated  by progressive LE weakness --Cycle #4, 04/03/17: Bortezomib dose reduced to 93m/kg        Medical History: Past Medical History:  Diagnosis Date  . Arthritis    knees  . Carpal tunnel syndrome   . CHF (congestive heart failure) ([place]   . Diabetes mellitus    type 2  . GERD (gastroesophageal reflux disease)   . Heart murmur    "Little, No concerns" per Dr  DDannielle Burnpt reported.  . Hypertension    controlled using a guided approch with plasma renin activity  . Neuropathy   . Peripheral vascular disease ([facility]   . Sleep apnea    Uses CPAP    Surgical History: Past Surgical History:  Procedure Laterality Date  . BIOPSY  11/28/2016   Procedure: BIOPSY;  Surgeon: RRogene Houston MD;  [facility];  Service: Endoscopy;;  gastric  . [place] CERVICAL [place] BIOPSY     cervical lesion  . ESOPHAGOGASTRODUODENOSCOPY (EGD) WITH PROPOFOL N/A 11/28/2016   Procedure: ESOPHAGOGASTRODUODENOSCOPY (EGD) WITH PROPOFOL;  Surgeon: RRogene Houston MD;  [facility];  Service: Endoscopy;  Laterality: N/A;  9:25  . IR FLUORO GUIDE PORT INSERTION RIGHT  01/29/2017  . IR [place] ACCESS RIGHT  01/29/2017  . lipoma removal     right shoulder 2001  . MULTIPLE EXTRACTIONS WITH  ALVEOLOPLASTY Bilateral 08/24/2012   Procedure: MULTIPLE EXTRACTION WITH ALVEOLOPLASTY BIOPSY OF RIGHT AND LEFT MANDIBLE ;  Surgeon: SGae Bon DDS;  [place]  Service: Oral Surgery;  Laterality: Bilateral;  . POSTERIOR LUMBAR FUSION 4 LEVEL N/A 12/26/2016   Procedure: THORACIC EIGHT TUMOR RESECTION, THORACIC SIX- THORACIC TEN POSTERIOR SPINAL FUSION;  Surgeon: CAshok Pall MD;  [place]  Service: Neurosurgery;  Laterality: N/A;  THORACIC 8 TUMOR RESECTION, THORACIC 6- THORACIC 10 POSTERIOR SPINAL FUSION  . ROTATOR CUFF REPAIR     right shoulder  . TUBAL LIGATION      Family History: Family History  Problem Relation Age of Onset  . Diabetes Mother   . Hypertension Mother   . Heart disease Mother   . Diabetes Father   . Heart disease Sister   . Diabetes Sister     Social History: Social History   Social History  . Marital status: Single    Spouse name: N/A  . Number of children: N/A  . Years of education: N/A   Occupational History  . Not on file.   Social History Main Topics  . Smoking status: Former Smoker    Packs/day: 0.50    Years: 6.00    Types: Cigarettes    Quit date: 11/23/2011  . Smokeless tobacco: Never Used     Comment: quit summer 2013  . Alcohol use No  . Drug use: No  . Sexual activity: Not Currently    Birth control/ protection: Post-menopausal   Other Topics Concern  . Not on file   Social History Narrative  . No narrative on file    Allergies: Allergies  Allergen Reactions  . No Known Allergies     Medications:  Current Outpatient Prescriptions  Medication Sig Dispense Refill  . acyclovir (ZOVIRAX) 400 MG tablet Take 1 tablet (400 mg total) by mouth 2 (two) times daily. 60 tablet 3  . chlorthalidone (HYGROTON) 25 MG tablet Take 25 mg by mouth 2 (two) times daily.   11  . dexamethasone (DECADRON) 4 MG tablet Take 10 tablets (  40 mg) on days 1, 8, and 15 of chemo. Repeat every 21 days. 30 tablet 3  . diltiazem (CARDIZEM  CD) 300 MG 24 hr capsule Take 337ms by mouth daily  3  . enalapril (VASOTEC) 20 MG tablet Take 20 mg by mouth 2 (two) times daily.     . insulin degludec (TRESIBA FLEXTOUCH) 100 UNIT/ML SOPN FlexTouch Pen Inject 0.5 mLs (50 Units total) into the skin daily at 10 pm. 5 pen 2  . Insulin Pen Needle (B-D ULTRAFINE III SHORT PEN) 31G X 8 MM MISC 1 each by Does not apply route as directed. 100 each 3  . lidocaine-prilocaine (EMLA) cream Apply to affected area once 30 g 3  . LORazepam (ATIVAN) 0.5 MG tablet Take 1 tablet (0.5 mg total) by mouth every 6 (six) hours as needed (Nausea or vomiting). 30 tablet 0  . metFORMIN (GLUCOPHAGE) 1000 MG tablet Take 1,000 mg by mouth 2 (two) times daily.     . nicotine (NICODERM CQ - DOSED IN MG/24 HOURS) 14 mg/24hr patch Place 1 patch (14 mg total) onto the skin daily. 28 patch 0  . nicotine polacrilex (COMMIT) 2 MG lozenge Take 1 lozenge (2 mg total) by mouth as needed for smoking cessation. 100 tablet 0  . ondansetron (ZOFRAN) 8 MG tablet Take 1 tablet (8 mg total) by mouth 2 (two) times daily as needed (Nausea or vomiting). 30 tablet 1  . oxyCODONE (OXY IR/ROXICODONE) 5 MG immediate release tablet Take 1 tablet (5 mg total) by mouth every 4 (four) hours as needed for severe pain. 30 tablet 0  . prochlorperazine (COMPAZINE) 10 MG tablet Take 1 tablet (10 mg total) by mouth every 6 (six) hours as needed (Nausea or vomiting). 30 tablet 1  . rivaroxaban (XARELTO) 10 MG TABS tablet Take 1 tablet (10 mg total) by mouth daily. 30 tablet 3  . tiZANidine (ZANAFLEX) 4 MG tablet Take 1 tablet (4 mg total) by mouth every 6 (six) hours as needed for muscle spasms. 60 tablet 0  . gabapentin (NEURONTIN) 300 MG capsule Take 1 capsule (300 mg total) by mouth 3 (three) times daily. 63 capsule 3  . lenalidomide (REVLIMID) 25 MG capsule Take one capsule (258m by mouth once daily on days 1-14 every 21 days. Auth# [id number] 14 capsule 0   No current facility-administered  medications for this visit.     Review of Systems: Review of Systems  Gastrointestinal: Negative for nausea.  Musculoskeletal: Positive for arthralgias, back pain, gait problem and myalgias.  Neurological: Positive for extremity weakness, gait problem and numbness.  All other systems reviewed and are negative.    PHYSICAL EXAMINATION Blood pressure 109/68, pulse 80, temperature 98.7 F (37.1 C), temperature source Oral, resp. rate 16, weight 211 lb (95.7 kg), SpO2 100 %.  ECOG PERFORMANCE STATUS: 2 - Symptomatic, <50% confined to bed  Physical Exam  Constitutional: Christine Garrett is oriented to person, place, and time and well-developed, well-nourished, and in no distress.  HENT:  Head: Normocephalic and atraumatic.  Mouth/Throat: Oropharynx is clear and moist. No oropharyngeal exudate.  Eyes: Pupils are equal, round, and reactive to light. EOM are normal. No scleral icterus.  Neck: No thyromegaly present.  Cardiovascular: Normal rate, regular rhythm and normal heart sounds.  Exam reveals no gallop.   No murmur heard. Pulmonary/Chest: Effort normal and breath sounds normal. Christine Garrett has no wheezes.  Abdominal: Soft. Bowel sounds are normal. Christine Garrett exhibits no distension and no mass. There is no tenderness. There is  no rebound.  Musculoskeletal: Normal range of motion. Christine Garrett exhibits no edema.       Right shoulder: Christine Garrett exhibits no tenderness, no bony tenderness and no swelling.  Neurological: Christine Garrett is alert and oriented to person, place, and time.  Patient ambulates with difficulty and uses a walker.     LABORATORY DATA: I have personally reviewed the data as listed: Appointment on 04/03/2017  Component Date Value Ref Range Status  . WBC 04/03/2017 4.3  3.9 - 10.3 10e3/uL Final  . NEUT# 04/03/2017 2.1  1.5 - 6.5 10e3/uL Final  . HGB 04/03/2017 10.3* 11.6 - 15.9 g/dL Final  . HCT 04/03/2017 31.2* 34.8 - 46.6 % Final  . Platelets 04/03/2017 231  145 - 400 10e3/uL Final  . MCV 04/03/2017 92.0  79.5  - 101.0 fL Final  . MCH 04/03/2017 30.4  25.1 - 34.0 pg Final  . MCHC 04/03/2017 33.0  31.5 - 36.0 g/dL Final  . RBC 04/03/2017 3.39* 3.70 - 5.45 10e6/uL Final  . RDW 04/03/2017 15.0* 11.2 - 14.5 % Final  . lymph# 04/03/2017 1.3  0.9 - 3.3 10e3/uL Final  . MONO# 04/03/2017 0.4  0.1 - 0.9 10e3/uL Final  . Eosinophils Absolute 04/03/2017 0.5  0.0 - 0.5 10e3/uL Final  . Basophils Absolute 04/03/2017 0.0  0.0 - 0.1 10e3/uL Final  . NEUT% 04/03/2017 49.2  38.4 - 76.8 % Final  . LYMPH% 04/03/2017 29.6  14.0 - 49.7 % Final  . MONO% 04/03/2017 8.6  0.0 - 14.0 % Final  . EOS% 04/03/2017 11.7* 0.0 - 7.0 % Final  . BASO% 04/03/2017 0.9  0.0 - 2.0 % Final  . Sodium 04/03/2017 140  136 - 145 mEq/L Final  . Potassium 04/03/2017 3.7  3.5 - 5.1 mEq/L Final  . Chloride 04/03/2017 107  98 - 109 mEq/L Final  . CO2 04/03/2017 24  22 - 29 mEq/L Final  . Glucose 04/03/2017 131  70 - 140 mg/dl Final   Glucose reference range is for nonfasting patients. Fasting glucose reference range is 70- 100.  Marland Kitchen BUN 04/03/2017 8.4  7.0 - 26.0 mg/dL Final  . Creatinine 04/03/2017 0.9  0.6 - 1.1 mg/dL Final  . Total Bilirubin 04/03/2017 0.38  0.20 - 1.20 mg/dL Final  . Alkaline Phosphatase 04/03/2017 75  40 - 150 U/L Final  . AST 04/03/2017 10  5 - 34 U/L Final  . ALT 04/03/2017 11  0 - 55 U/L Final  . Total Protein 04/03/2017 6.5  6.4 - 8.3 g/dL Final  . Albumin 04/03/2017 3.4* 3.5 - 5.0 g/dL Final  . Calcium 04/03/2017 8.6  8.4 - 10.4 mg/dL Final  . Anion Gap 04/03/2017 8  3 - 11 mEq/L Final  . EGFR 04/03/2017 >60  >60 ml/min/1.73 m2 Final   eGFR is calculated using the CKD-EPI Creatinine Equation (2009)  . Magnesium 04/03/2017 1.7  1.5 - 2.5 mg/dl Final  . IgG, Qn, Serum 04/03/2017 748  700 - 1,600 mg/dL Final  . IgA, Qn, Serum 04/03/2017 118  87 - 352 mg/dL Final  . IgM, Qn, Serum 04/03/2017 31  26 - 217 mg/dL Final  . Total Protein 04/03/2017 6.2  6.0 - 8.5 g/dL Final  . Albumin SerPl Elph-Mcnc 04/03/2017 3.2   2.9 - 4.4 g/dL Final  . Alpha 1 04/03/2017 0.3  0.0 - 0.4 g/dL Final  . Alpha2 Glob SerPl Elph-Mcnc 04/03/2017 0.8  0.4 - 1.0 g/dL Final  . B-Globulin SerPl Elph-Mcnc 04/03/2017 1.0  0.7 - 1.3  g/dL Final  . Gamma Glob SerPl Elph-Mcnc 04/03/2017 0.9  0.4 - 1.8 g/dL Final  . M Protein SerPl Elph-Mcnc 04/03/2017 0.4* Not Observed g/dL Final  . Globulin, Total 04/03/2017 3.0  2.2 - 3.9 g/dL Final  . Albumin/Glob SerPl 04/03/2017 1.1  0.7 - 1.7 Final  . IFE 1 04/03/2017 Comment   Final   Comment: Immunofixation shows IgG monoclonal protein with kappa light chain specificity.   . Please Note 04/03/2017 Comment   Final   Comment: Protein electrophoresis scan will follow via computer, mail, or courier delivery.   Loletha Grayer Kappa Free Light Chain 04/03/2017 18.5  3.3 - 19.4 mg/L Final  . Ig Lambda Free Light Chain 04/03/2017 18.0  5.7 - 26.3 mg/L Final  . Kappa/Lambda FluidC Ratio 04/03/2017 1.03  0.26 - 1.65 Final       [name], MD

## 2017-04-10 ENCOUNTER — Ambulatory Visit (HOSPITAL_BASED_OUTPATIENT_CLINIC_OR_DEPARTMENT_OTHER): Payer: Medicare Other

## 2017-04-10 ENCOUNTER — Other Ambulatory Visit: Payer: Self-pay

## 2017-04-10 ENCOUNTER — Other Ambulatory Visit: Payer: Medicare Other

## 2017-04-10 ENCOUNTER — Other Ambulatory Visit (HOSPITAL_BASED_OUTPATIENT_CLINIC_OR_DEPARTMENT_OTHER): Payer: Medicare Other

## 2017-04-10 VITALS — BP 123/80 | HR 85 | Temp 98.9°F | Resp 18

## 2017-04-10 DIAGNOSIS — Z5112 Encounter for antineoplastic immunotherapy: Secondary | ICD-10-CM

## 2017-04-10 DIAGNOSIS — C9000 Multiple myeloma not having achieved remission: Secondary | ICD-10-CM

## 2017-04-10 LAB — CBC WITH DIFFERENTIAL/PLATELET
BASO%: 2 % (ref 0.0–2.0)
Basophils Absolute: 0.1 10*3/uL (ref 0.0–0.1)
EOS ABS: 0.4 10*3/uL (ref 0.0–0.5)
EOS%: 9.5 % — AB (ref 0.0–7.0)
HCT: 32 % — ABNORMAL LOW (ref 34.8–46.6)
HGB: 10.8 g/dL — ABNORMAL LOW (ref 11.6–15.9)
LYMPH%: 28.3 % (ref 14.0–49.7)
MCH: 30.7 pg (ref 25.1–34.0)
MCHC: 33.6 g/dL (ref 31.5–36.0)
MCV: 91.5 fL (ref 79.5–101.0)
MONO#: 0.5 10*3/uL (ref 0.1–0.9)
MONO%: 11.2 % (ref 0.0–14.0)
NEUT%: 49 % (ref 38.4–76.8)
NEUTROS ABS: 2.1 10*3/uL (ref 1.5–6.5)
Platelets: 211 10*3/uL (ref 145–400)
RBC: 3.5 10*6/uL — AB (ref 3.70–5.45)
RDW: 16.7 % — ABNORMAL HIGH (ref 11.2–14.5)
WBC: 4.4 10*3/uL (ref 3.9–10.3)
lymph#: 1.2 10*3/uL (ref 0.9–3.3)

## 2017-04-10 LAB — COMPREHENSIVE METABOLIC PANEL
ALBUMIN: 3.5 g/dL (ref 3.5–5.0)
ALT: 12 U/L (ref 0–55)
ANION GAP: 10 meq/L (ref 3–11)
AST: 11 U/L (ref 5–34)
Alkaline Phosphatase: 68 U/L (ref 40–150)
BILIRUBIN TOTAL: 0.58 mg/dL (ref 0.20–1.20)
BUN: 8.8 mg/dL (ref 7.0–26.0)
CALCIUM: 9.1 mg/dL (ref 8.4–10.4)
CHLORIDE: 105 meq/L (ref 98–109)
CO2: 23 mEq/L (ref 22–29)
CREATININE: 1 mg/dL (ref 0.6–1.1)
EGFR: >60 mL/min/{1.73_m2} (ref >60)
Glucose: 136 mg/dl (ref 70–140)
Potassium: 3.8 mEq/L (ref 3.5–5.1)
Sodium: 138 mEq/L (ref 136–145)
TOTAL PROTEIN: 6.6 g/dL (ref 6.4–8.3)

## 2017-04-10 MED ORDER — BORTEZOMIB CHEMO SQ INJECTION 3.5 MG (2.5MG/ML)
1.0000 mg/m2 | Freq: Once | INTRAMUSCULAR | Status: AC
  Administered 2017-04-10: 2.25 mg via SUBCUTANEOUS
  Filled 2017-04-10: qty 2.25

## 2017-04-10 MED ORDER — PROCHLORPERAZINE MALEATE 10 MG PO TABS
10.0000 mg | ORAL_TABLET | Freq: Once | ORAL | Status: AC
  Administered 2017-04-10: 10 mg via ORAL

## 2017-04-10 MED ORDER — PROCHLORPERAZINE MALEATE 10 MG PO TABS
ORAL_TABLET | ORAL | Status: AC
  Filled 2017-04-10: qty 1

## 2017-04-10 NOTE — Patient Instructions (Signed)
[facility] Discharge Instructions for Patients Receiving Chemotherapy  Today you received the following chemotherapy agents Velcade To help prevent nausea and vomiting after your treatment, we encourage you to take your nausea medication as prescribed.   If you develop nausea and vomiting that is not controlled by your nausea medication, call the clinic.   BELOW ARE SYMPTOMS THAT SHOULD BE REPORTED IMMEDIATELY:  *FEVER GREATER THAN 100.5 F  *CHILLS WITH OR WITHOUT FEVER  NAUSEA AND VOMITING THAT IS NOT CONTROLLED WITH YOUR NAUSEA MEDICATION  *UNUSUAL SHORTNESS OF BREATH  *UNUSUAL BRUISING OR BLEEDING  TENDERNESS IN MOUTH AND THROAT WITH OR WITHOUT PRESENCE OF ULCERS  *URINARY PROBLEMS  *BOWEL PROBLEMS  UNUSUAL RASH Items with * indicate a potential emergency and should be followed up as soon as possible.  Feel free to call the clinic should you have any questions or concerns. The clinic phone number is [phone].  Please show the CHEMO ALERT CARD at check-in to the Emergency Department and triage nurse.   

## 2017-04-13 ENCOUNTER — Other Ambulatory Visit (INDEPENDENT_AMBULATORY_CARE_PROVIDER_SITE_OTHER): Payer: Self-pay | Admitting: Internal Medicine

## 2017-04-14 NOTE — Telephone Encounter (Signed)
Patient will need to have appointment prior to further refills. 

## 2017-04-15 ENCOUNTER — Ambulatory Visit (HOSPITAL_COMMUNITY)
Admission: RE | Admit: 2017-04-15 | Discharge: 2017-04-15 | Disposition: A | Discharge status: Home / Self Care | Payer: Medicare Other | Source: Ambulatory Visit | Attending: Hematology and Oncology | Admitting: Hematology and Oncology

## 2017-04-15 ENCOUNTER — Telehealth: Payer: Self-pay | Admitting: Hematology and Oncology

## 2017-04-15 DIAGNOSIS — I700 Atherosclerosis of aorta: Secondary | ICD-10-CM | POA: Insufficient documentation

## 2017-04-15 DIAGNOSIS — Z981 Arthrodesis status: Secondary | ICD-10-CM | POA: Diagnosis not present

## 2017-04-15 DIAGNOSIS — C9000 Multiple myeloma not having achieved remission: Secondary | ICD-10-CM

## 2017-04-15 DIAGNOSIS — Z9889 Other specified postprocedural states: Secondary | ICD-10-CM | POA: Diagnosis not present

## 2017-04-15 LAB — GLUCOSE, CAPILLARY: GLUCOSE-CAPILLARY: 123 mg/dL — AB (ref 65–99)

## 2017-04-15 MED ORDER — FLUDEOXYGLUCOSE F - 18 (FDG) INJECTION
10.5500 | Freq: Once | INTRAVENOUS | Status: AC | PRN
  Administered 2017-04-15: 10.55 via INTRAVENOUS

## 2017-04-15 NOTE — Telephone Encounter (Signed)
Spoke to patients daughter regarding updates to schedule. Patient scheduled per 10/25 sch message.

## 2017-04-16 ENCOUNTER — Other Ambulatory Visit: Payer: Self-pay | Admitting: *Deleted

## 2017-04-16 DIAGNOSIS — C9000 Multiple myeloma not having achieved remission: Secondary | ICD-10-CM

## 2017-04-17 ENCOUNTER — Ambulatory Visit: Payer: Medicare Other

## 2017-04-17 ENCOUNTER — Other Ambulatory Visit (HOSPITAL_BASED_OUTPATIENT_CLINIC_OR_DEPARTMENT_OTHER): Payer: Medicare Other

## 2017-04-17 ENCOUNTER — Other Ambulatory Visit: Payer: Self-pay | Admitting: Hematology and Oncology

## 2017-04-17 ENCOUNTER — Ambulatory Visit (HOSPITAL_BASED_OUTPATIENT_CLINIC_OR_DEPARTMENT_OTHER): Payer: Medicare Other

## 2017-04-17 VITALS — BP 119/75 | HR 76 | Temp 99.2°F | Resp 17

## 2017-04-17 DIAGNOSIS — Z5112 Encounter for antineoplastic immunotherapy: Secondary | ICD-10-CM | POA: Diagnosis not present

## 2017-04-17 DIAGNOSIS — C9000 Multiple myeloma not having achieved remission: Secondary | ICD-10-CM | POA: Diagnosis not present

## 2017-04-17 LAB — COMPREHENSIVE METABOLIC PANEL
ALBUMIN: 3.6 g/dL (ref 3.5–5.0)
ALT: 11 U/L (ref 0–55)
AST: 10 U/L (ref 5–34)
Alkaline Phosphatase: 69 U/L (ref 40–150)
Anion Gap: 9 mEq/L (ref 3–11)
BILIRUBIN TOTAL: 0.44 mg/dL (ref 0.20–1.20)
BUN: 10.5 mg/dL (ref 7.0–26.0)
CALCIUM: 9.1 mg/dL (ref 8.4–10.4)
CHLORIDE: 104 meq/L (ref 98–109)
CO2: 24 mEq/L (ref 22–29)
CREATININE: 0.9 mg/dL (ref 0.6–1.1)
EGFR: >60 mL/min/{1.73_m2} (ref >60)
Glucose: 191 mg/dl — ABNORMAL HIGH (ref 70–140)
Potassium: 3.5 mEq/L (ref 3.5–5.1)
Sodium: 138 mEq/L (ref 136–145)
TOTAL PROTEIN: 6.8 g/dL (ref 6.4–8.3)

## 2017-04-17 LAB — CBC WITH DIFFERENTIAL/PLATELET
BASO%: 1.5 % (ref 0.0–2.0)
Basophils Absolute: 0.1 10*3/uL (ref 0.0–0.1)
EOS%: 4.7 % (ref 0.0–7.0)
Eosinophils Absolute: 0.2 10*3/uL (ref 0.0–0.5)
HEMATOCRIT: 32.1 % — AB (ref 34.8–46.6)
HEMOGLOBIN: 10.8 g/dL — AB (ref 11.6–15.9)
LYMPH#: 0.8 10*3/uL — AB (ref 0.9–3.3)
LYMPH%: 18.7 % (ref 14.0–49.7)
MCH: 30.9 pg (ref 25.1–34.0)
MCHC: 33.5 g/dL (ref 31.5–36.0)
MCV: 92 fL (ref 79.5–101.0)
MONO#: 0.1 10*3/uL (ref 0.1–0.9)
MONO%: 3.1 % (ref 0.0–14.0)
NEUT%: 72 % (ref 38.4–76.8)
NEUTROS ABS: 3.2 10*3/uL (ref 1.5–6.5)
Platelets: 278 10*3/uL (ref 145–400)
RBC: 3.49 10*6/uL — ABNORMAL LOW (ref 3.70–5.45)
RDW: 15.8 % — AB (ref 11.2–14.5)
WBC: 4.5 10*3/uL (ref 3.9–10.3)

## 2017-04-17 MED ORDER — PROCHLORPERAZINE MALEATE 10 MG PO TABS
ORAL_TABLET | ORAL | Status: AC
Start: 2017-04-17 — End: 2017-04-17
  Filled 2017-04-17: qty 1

## 2017-04-17 MED ORDER — ZOLEDRONIC ACID 4 MG/5ML IV CONC: 4.0000 mg | Freq: Once | INTRAVENOUS | Status: DC

## 2017-04-17 MED ORDER — SODIUM CHLORIDE 0.9% FLUSH
10.0000 mL | INTRAVENOUS | Status: DC | PRN
  Administered 2017-04-17: 10 mL via INTRAVENOUS
  Filled 2017-04-17: qty 10

## 2017-04-17 MED ORDER — PROCHLORPERAZINE MALEATE 10 MG PO TABS
10.0000 mg | ORAL_TABLET | Freq: Once | ORAL | Status: AC
  Administered 2017-04-17: 10 mg via ORAL

## 2017-04-17 MED ORDER — SODIUM CHLORIDE 0.9% FLUSH
10.0000 mL | Freq: Once | INTRAVENOUS | Status: AC
  Administered 2017-04-17: 10 mL via INTRAVENOUS
  Filled 2017-04-17: qty 10

## 2017-04-17 MED ORDER — HEPARIN SOD (PORK) LOCK FLUSH 100 UNIT/ML IV SOLN
500.0000 [IU] | Freq: Once | INTRAVENOUS | Status: DC | PRN
  Filled 2017-04-17: qty 5

## 2017-04-17 MED ORDER — SODIUM CHLORIDE 0.9 % IV SOLN
Freq: Once | INTRAVENOUS | Status: AC
  Administered 2017-04-17: 09:00:00 via INTRAVENOUS

## 2017-04-17 MED ORDER — BORTEZOMIB CHEMO SQ INJECTION 3.5 MG (2.5MG/ML)
1.0000 mg/m2 | Freq: Once | INTRAMUSCULAR | Status: AC
  Administered 2017-04-17: 2.25 mg via SUBCUTANEOUS
  Filled 2017-04-17: qty 2.25

## 2017-04-17 MED ORDER — HEPARIN SOD (PORK) LOCK FLUSH 100 UNIT/ML IV SOLN
500.0000 [IU] | Freq: Once | INTRAVENOUS | Status: AC
  Administered 2017-04-17: 500 [IU] via INTRAVENOUS
  Filled 2017-04-17: qty 5

## 2017-04-17 NOTE — Patient Instructions (Signed)
Hypotension (Low Blood Pressure) As your heart beats, it forces blood through your body. This force is called blood pressure. If you have hypotension, you have low blood pressure. When your blood pressure is too low, you may not get enough blood to your brain. You may feel weak, feel light-headed, have a fast heartbeat, or even pass out (faint). Follow these instructions at home: Eating and drinking  Drink enough fluids to keep your pee (urine) clear or pale yellow.  Eat a healthy diet, and follow instructions from your doctor about eating or drinking restrictions. A healthy diet includes: ? Fresh fruits and vegetables. ? Whole grains. ? Low-fat (lean) meats. ? Low-fat dairy products.  Eat extra salt only as told. Do not add extra salt to your diet unless your doctor tells you to.  Eat small meals often.  Avoid standing up quickly after you eat. Medicines  Take over-the-counter and prescription medicines only as told by your doctor. ? Follow instructions from your doctor about changing how much you take (the dosage) of your medicines, if this applies. ? Do not stop or change your medicine on your own. General instructions  Wear compression stockings as told by your doctor.  Get up slowly from lying down or sitting.  Avoid hot showers and a lot of heat as told by your doctor.  Return to your normal activities as told by your doctor. Ask what activities are safe for you.  Do not use any products that contain nicotine or tobacco, such as cigarettes and e-cigarettes. If you need help quitting, ask your doctor.  Keep all follow-up visits as told by your doctor. This is important. Contact a doctor if:  You throw up (vomit).  You have watery poop (diarrhea).  You have a fever for more than 2-3 days.  You feel more thirsty than normal.  You feel weak and tired. Get help right away if:  You have chest pain.  You have a fast or irregular heartbeat.  You lose feeling (get  numbness) in any part of your body.  You cannot move your arms or your legs.  You have trouble talking.  You get sweaty or feel light-headed.  You faint.  You have trouble breathing.  You have trouble staying awake.  You feel confused. This information is not intended to replace advice given to you by your health care provider. Make sure you discuss any questions you have with your health care provider. Document Released: 08/28/2009 Document Revised: 02/20/2016 Document Reviewed: 02/20/2016 Elsevier Interactive Patient Education  2017 [place] Discharge Instructions for Patients Receiving Chemotherapy  Today you received the following chemotherapy agents bortezomib (Velcade)  To help prevent nausea and vomiting after your treatment, we encourage you to take your nausea medication as directed by your MD.  If you develop nausea and vomiting that is not controlled by your nausea medication, call the clinic.   BELOW ARE SYMPTOMS THAT SHOULD BE REPORTED IMMEDIATELY:  *FEVER GREATER THAN 100.5 F  *CHILLS WITH OR WITHOUT FEVER  NAUSEA AND VOMITING THAT IS NOT CONTROLLED WITH YOUR NAUSEA MEDICATION  *UNUSUAL SHORTNESS OF BREATH  *UNUSUAL BRUISING OR BLEEDING  TENDERNESS IN MOUTH AND THROAT WITH OR WITHOUT PRESENCE OF ULCERS  *URINARY PROBLEMS  *BOWEL PROBLEMS  UNUSUAL RASH Items with * indicate a potential emergency and should be followed up as soon as possible.  Feel free to call the clinic you have any questions or concerns. The clinic phone number is [phone].  Please show the [place] at check-in to the Emergency Department and triage nurse.

## 2017-04-17 NOTE — Progress Notes (Signed)
Pt stated she "gets dizzy" when she moves from sitting to standing position. Educated pt on orthostatic hypotension, and the importance of rising to a standing position slowly, pt verbalized understanding. Included information in discharge paperwork.   Pt stated she is on 2 BP medications, and her home PT has noticed a drop in her BP. Pt to monitor BP at home and notify PCP of low BP and dizziness (who has prescribed BP Rx's). BP today was stable, PT was not experiencing dizziness.    Pt glucose is 191 this AM. Pt denies any s/s hyperglycemia. Pt appears asymptomatic. Pt stated she did not take Metformin this AM but will take it as soon as she gets home today.

## 2017-04-21 ENCOUNTER — Other Ambulatory Visit: Payer: Self-pay | Admitting: Student

## 2017-04-21 ENCOUNTER — Ambulatory Visit (HOSPITAL_COMMUNITY)
Admission: RE | Admit: 2017-04-21 | Discharge: 2017-04-21 | Disposition: A | Discharge status: Home / Self Care | Payer: Medicare Other | Source: Ambulatory Visit | Attending: Hematology and Oncology | Admitting: Hematology and Oncology

## 2017-04-21 ENCOUNTER — Encounter (HOSPITAL_COMMUNITY): Payer: Self-pay

## 2017-04-21 DIAGNOSIS — G4730 Sleep apnea, unspecified: Secondary | ICD-10-CM | POA: Diagnosis not present

## 2017-04-21 DIAGNOSIS — E1151 Type 2 diabetes mellitus with diabetic peripheral angiopathy without gangrene: Secondary | ICD-10-CM | POA: Diagnosis not present

## 2017-04-21 DIAGNOSIS — D649 Anemia, unspecified: Secondary | ICD-10-CM | POA: Insufficient documentation

## 2017-04-21 DIAGNOSIS — K219 Gastro-esophageal reflux disease without esophagitis: Secondary | ICD-10-CM | POA: Insufficient documentation

## 2017-04-21 DIAGNOSIS — I509 Heart failure, unspecified: Secondary | ICD-10-CM | POA: Insufficient documentation

## 2017-04-21 DIAGNOSIS — I110 Hypertensive heart disease with heart failure: Secondary | ICD-10-CM | POA: Diagnosis not present

## 2017-04-21 DIAGNOSIS — Z794 Long term (current) use of insulin: Secondary | ICD-10-CM | POA: Diagnosis not present

## 2017-04-21 DIAGNOSIS — D7589 Other specified diseases of blood and blood-forming organs: Secondary | ICD-10-CM | POA: Diagnosis not present

## 2017-04-21 DIAGNOSIS — C9000 Multiple myeloma not having achieved remission: Secondary | ICD-10-CM | POA: Diagnosis not present

## 2017-04-21 DIAGNOSIS — Z79899 Other long term (current) drug therapy: Secondary | ICD-10-CM | POA: Diagnosis not present

## 2017-04-21 DIAGNOSIS — E1140 Type 2 diabetes mellitus with diabetic neuropathy, unspecified: Secondary | ICD-10-CM | POA: Diagnosis not present

## 2017-04-21 DIAGNOSIS — Z7901 Long term (current) use of anticoagulants: Secondary | ICD-10-CM | POA: Diagnosis not present

## 2017-04-21 LAB — GLUCOSE, CAPILLARY: Glucose-Capillary: 181 mg/dL — ABNORMAL HIGH (ref 65–99)

## 2017-04-21 LAB — CBC
HEMATOCRIT: 31.7 % — AB (ref 36.0–46.0)
Hemoglobin: 11 g/dL — ABNORMAL LOW (ref 12.0–15.0)
MCH: 31 pg (ref 26.0–34.0)
MCHC: 34.7 g/dL (ref 30.0–36.0)
MCV: 89.3 fL (ref 78.0–100.0)
PLATELETS: 264 10*3/uL (ref 150–400)
RBC: 3.55 MIL/uL — ABNORMAL LOW (ref 3.87–5.11)
RDW: 14.3 % (ref 11.5–15.5)
WBC: 4.8 10*3/uL (ref 4.0–10.5)

## 2017-04-21 LAB — APTT: APTT: 28 s (ref 24–36)

## 2017-04-21 LAB — PROTIME-INR
INR: 1.12
PROTHROMBIN TIME: 14.3 s (ref 11.4–15.2)

## 2017-04-21 MED ORDER — SODIUM CHLORIDE 0.9 % IV SOLN
INTRAVENOUS | Status: DC
  Administered 2017-04-21: 10:00:00 via INTRAVENOUS

## 2017-04-21 MED ORDER — FENTANYL CITRATE (PF) 100 MCG/2ML IJ SOLN
INTRAMUSCULAR | Status: AC
  Filled 2017-04-21: qty 2

## 2017-04-21 MED ORDER — MIDAZOLAM HCL 2 MG/2ML IJ SOLN
INTRAMUSCULAR | Status: AC | PRN
  Administered 2017-04-21 (×2): 1 mg via INTRAVENOUS

## 2017-04-21 MED ORDER — MIDAZOLAM HCL 2 MG/2ML IJ SOLN
INTRAMUSCULAR | Status: AC
  Filled 2017-04-21: qty 2

## 2017-04-21 MED ORDER — LIDOCAINE HCL (PF) 1 % IJ SOLN
INTRAMUSCULAR | Status: AC | PRN
  Administered 2017-04-21: 20 mL

## 2017-04-21 MED ORDER — HEPARIN SOD (PORK) LOCK FLUSH 100 UNIT/ML IV SOLN
500.0000 [IU] | INTRAVENOUS | Status: AC | PRN
  Administered 2017-04-21: 500 [IU]
  Filled 2017-04-21: qty 5

## 2017-04-21 MED ORDER — FENTANYL CITRATE (PF) 100 MCG/2ML IJ SOLN
INTRAMUSCULAR | Status: AC | PRN
  Administered 2017-04-21 (×2): 50 ug via INTRAVENOUS

## 2017-04-21 NOTE — H&P (Signed)
Chief Complaint: Patient was seen in consultation today for multiple myeloma  Referring Physician(s): Perlov,Mikhail G  Supervising Physician: Arne Cleveland  Patient Status: [facility] - Out-pt  History of Present Illness: Christine Garrett is a 61 y.o. female with past medical history of arthritis, CHF, DM, GERD, HTN, PVD, and multiple myeloma known to radiology service from prior bone marrow biopsy 02/06/17.   She has been undergoing treatment at the cancer center.  IR consulted for bone marrow biopsy for disease re-staging.   She has been NPO.  She does take Xarelto and last took yesterday.  She presents today in her usual state of health.   Past Medical History:  Diagnosis Date  . Arthritis    knees  . Carpal tunnel syndrome   . CHF (congestive heart failure) ([place])   . Diabetes mellitus    type 2  . GERD (gastroesophageal reflux disease)   . Heart murmur    "Little, No concerns" per Dr  Dannielle Burn pt reported.  . Hypertension    controlled using a guided approch with plasma renin activity  . Neuropathy   . Peripheral vascular disease ([place])   . Sleep apnea    Uses CPAP    Past Surgical History:  Procedure Laterality Date  . [place]  . CERVICAL CONE BIOPSY     cervical lesion  . IR FLUORO GUIDE PORT INSERTION RIGHT  01/29/2017  . IR US GUIDE VASC ACCESS RIGHT  01/29/2017  . lipoma removal     right shoulder 2001  . ROTATOR CUFF REPAIR     right shoulder  . TUBAL LIGATION      Allergies: No known allergies  Medications: Prior to Admission medications   Medication Sig Start Date End Date Taking? Authorizing Provider  acyclovir (ZOVIRAX) 400 MG tablet Take 1 tablet (400 mg total) by mouth 2 (two) times daily. 01/22/17  Yes [name], MD  chlorthalidone (HYGROTON) 25 MG tablet Take 25 mg by mouth 2 (two) times daily.  11/17/16  Yes [provider]  dexamethasone (DECADRON) 4 MG tablet Take 10 tablets (40 mg) on days 1, 8, and 15 of chemo.  Repeat every 21 days. 01/22/17  Yes [name], MD  diltiazem (CARDIZEM CD) 300 MG 24 hr capsule Take 336ms by mouth daily 10/19/16  Yes [provider]  enalapril (VASOTEC) 20 MG tablet Take 20 mg by mouth 2 (two) times daily.    Yes [provider]  gabapentin (NEURONTIN) 300 MG capsule Take 1 capsule (300 mg total) by mouth 3 (three) times daily. 01/30/17 04/21/17 Yes Perlov, MMarinell Blight MD  insulin degludec (TRESIBA FLEXTOUCH) 100 UNIT/ML SOPN FlexTouch Pen Inject 0.5 mLs (50 Units total) into the skin daily at 10 pm. 11/20/16  Yes Nida, GMarella Chimes MD  KLOR-CON M20 20 MEQ tablet TAKE 1 TABLET BY MOUTH EVERY DAY 04/14/17  Yes Rehman, NMechele Dawley MD  lenalidomide (REVLIMID) 25 MG capsule Take one capsule (235m by mouth once daily on days 1-14 every 21 days. Auth# [id number] 04/04/17  Yes PeArdath SaxMD  lidocaine-prilocaine (EMLA) cream Apply to affected area once 01/22/17  Yes Perlov, MiMarinell BlightMD  LORazepam (ATIVAN) 0.5 MG tablet Take 1 tablet (0.5 mg total) by mouth every 6 (six) hours as needed (Nausea or vomiting). 01/22/17  Yes PeArdath SaxMD  metFORMIN (GLUCOPHAGE) 1000 MG tablet Take 1,000 mg by mouth 2 (two) times daily.    Yes [provider]  ondansetron (ZOFRAN) 8 MG tablet Take 1 tablet (8 mg total) by mouth 2 (two) times daily as needed (Nausea or vomiting). 01/22/17  Yes Perlov, Marinell Blight, MD  oxyCODONE (OXY IR/ROXICODONE) 5 MG immediate release tablet Take 1 tablet (5 mg total) by mouth every 4 (four) hours as needed for severe pain. 03/13/17  Yes [name], MD  prochlorperazine (COMPAZINE) 10 MG tablet Take 1 tablet (10 mg total) by mouth every 6 (six) hours as needed (Nausea or vomiting). 01/22/17  Yes [name], MD  rivaroxaban (XARELTO) 10 MG TABS tablet Take 1 tablet (10 mg total) by mouth daily. 01/22/17  Yes Perlov, Marinell Blight, MD  tiZANidine (ZANAFLEX) 4 MG tablet Take 1 tablet (4 mg total) by mouth every 6 (six) hours as  needed for muscle spasms. 12/30/16  Yes Ashok Pall, MD  Insulin Pen Needle (B-D ULTRAFINE III SHORT PEN) 31G X 8 MM MISC 1 each by Does not apply route as directed. 11/13/16   Cassandria Anger, MD  nicotine (NICODERM CQ - DOSED IN MG/24 HOURS) 14 mg/24hr patch Place 1 patch (14 mg total) onto the skin daily. 03/13/17   [name], MD  nicotine polacrilex (COMMIT) 2 MG lozenge Take 1 lozenge (2 mg total) by mouth as needed for smoking cessation. 03/13/17   [name], MD     Family History  Problem Relation Age of Onset  . Diabetes Mother   . Hypertension Mother   . Heart disease Mother   . Diabetes Father   . Heart disease Sister   . Diabetes Sister     Social History   Socioeconomic History  . Marital status: Single    Spouse name: None  . Number of children: None  . Years of education: None  . Highest education level: None  Social Needs  . Financial resource strain: None  . Food insecurity - worry: None  . Food insecurity - inability: None  . Transportation needs - medical: None  . Transportation needs - non-medical: None  Occupational History  . None  Tobacco Use  . Smoking status: Former Smoker    Packs/day: 0.50    Years: 6.00    Pack years: 3.00    Types: Cigarettes    Last attempt to quit: 11/23/2011    Years since quitting: 5.4  . Smokeless tobacco: Never Used  . Tobacco comment: quit summer 2013  Substance and Sexual Activity  . Alcohol use: No    Alcohol/week: 3.6 oz    Types: 6 Cans of beer per week  . Drug use: No  . Sexual activity: Not Currently    Birth control/protection: Post-menopausal  Other Topics Concern  . None  Social History Narrative  . None    Review of Systems  Constitutional: Negative for fatigue and fever.  Respiratory: Negative for cough and shortness of breath.   Cardiovascular: Negative for chest pain.  Gastrointestinal: Negative for abdominal pain.  Musculoskeletal: Negative for back pain.    Psychiatric/Behavioral: Negative for behavioral problems and confusion.    Vital Signs: BP 132/77 (BP Location: Right Arm)   Pulse 90   Temp 98.2 F (36.8 C) (Oral)   Resp 18   SpO2 100%   Physical Exam  Constitutional: She is oriented to person, place, and time. She appears well-developed.  Cardiovascular: Normal rate, regular rhythm and normal heart sounds.  Pulmonary/Chest: Effort normal and breath sounds normal. No respiratory distress.  Abdominal: Soft.  Neurological: She is alert and  oriented to person, place, and time.  Skin: Skin is warm and dry.  Psychiatric: She has a normal mood and affect. Her behavior is normal. Judgment and thought content normal.  Nursing note and vitals reviewed.   Imaging: Nm Pet Image Restage (ps) Whole Body  Result Date: 04/15/2017 CLINICAL DATA:  Subsequent treatment strategy for multiple myeloma. EXAM: NUCLEAR MEDICINE PET WHOLE BODY TECHNIQUE: 10.55 mCi F-18 FDG was injected intravenously. Full-ring PET imaging was performed from the vertex to the feet after the radiotracer. CT data was obtained and used for attenuation correction and anatomic localization. FASTING BLOOD GLUCOSE:  Value: 123 mg/dl COMPARISON:  PET-CT 01/16/2017. Thoracic MRI 12/17/2016 and abdominopelvic CT 12/16/2016. FINDINGS: HEAD/NECK No hypermetabolic cervical lymph nodes are identified.There are no lesions of the pharyngeal mucosal space. No intracranial or calvarial abnormality identified. CHEST There are no hypermetabolic mediastinal, hilar or axillary lymph nodes. There is no suspicious pulmonary activity. Right IJ Port-A-Cath extends to the superior cavoatrial junction. Coronary artery calcifications are demonstrated. The lungs are clear. ABDOMEN/PELVIS There is no hypermetabolic activity within the liver, adrenal glands, spleen or pancreas. There is no hypermetabolic nodal activity. Aortoiliac atherosclerosis and a small left renal calculus are noted. SKELETON There is no  hypermetabolic activity to suggest osseous metastatic disease. There is low-level postsurgical activity and artifact related to previous thoracic fusion. There are degenerative changes throughout the lumbar spine, sacroiliac joints and right hip. EXTREMITIES No residual suspicious hypermetabolic activity within the extremities. The small foci of hypermetabolic activity previously demonstrated in the proximal right tibial diaphysis have improved. The greatest remaining activity has an SUV max of 1.83. No new lesions identified. IMPRESSION: 1. No residual hypermetabolic activity is demonstrated to suggest metabolically active multiple myeloma. The previously demonstrated low-level activity in the proximal right tibia has improved and is nonspecific. 2. Postsurgical changes in the thoracic spine status post fusion. 3.  Aortic Atherosclerosis (ICD10-I70.0). Electronically Signed   By: Richardean Sale M.D.   On: 04/15/2017 15:24    Labs:  CBC: Recent Labs    04/03/17 0846 04/10/17 0903 04/17/17 0753 04/21/17 0934  WBC 4.3 4.4 4.5 4.8  HGB 10.3* 10.8* 10.8* 11.0*  HCT 31.2* 32.0* 32.1* 31.7*  PLT 231 211 278 264    COAGS: Recent Labs    01/17/17 0924 04/21/17 0934  INR 0.99 1.12  APTT  --  28    BMP: Recent Labs    11/22/16 1253 12/16/16 1001  12/30/16 1547 01/17/17 0924  03/27/17 0834 04/03/17 0847 04/10/17 0903 04/17/17 0753  NA 131* 136   < > 132* 141   < > 138 140 138 138  K 3.1* 4.0   < > 3.6 3.6   < > 3.6 3.7 3.8 3.5  CL 94* 103  --  96* 103  --   --   --   --   --   CO2 25 26  --  28 27   < > '23 24 23 24  ' GLUCOSE 324* 154*   < > 187* 152*   < > 176* 131 136 191*  BUN 11 10  --  10 12   < > 12.4 8.4 8.8 10.5  CALCIUM 9.5 10.0  --  9.1 9.9   < > 9.2 8.6 9.1 9.1  CREATININE 0.86 0.86  --  0.96 0.74   < > 0.9 0.9 1.0 0.9  GFRNONAA >60  --   --  >60 >60  --   --   --   --   --  GFRAA >60  --   --  >60 >60  --   --   --   --   --    < > = values in this interval not  displayed.    LIVER FUNCTION TESTS: Recent Labs    03/27/17 0834 04/03/17 0847 04/03/17 0847 04/10/17 0903 04/17/17 0753  BILITOT 0.30 0.38  --  0.58 0.44  AST 11 10  --  11 10  ALT 11 11  --  12 11  ALKPHOS 84 75  --  68 69  PROT 6.7 6.5 6.2 6.6 6.8  ALBUMIN 3.4* 3.4*  --  3.5 3.6    TUMOR MARKERS: No results for input(s): AFPTM, CEA, CA199, CHROMGRNA in the last 8760 hours.  Assessment and Plan: Patient with past medical history of multiple myeloma presents for bone marrow biopsy for disease re-staging at the request of Dr. Lebron Conners. Patient presents today in their usual state of health.  She has been NPO and last took her Xarelto yesterday. Risks and benefits discussed with the patient including, but not limited to bleeding, infection, damage to adjacent structures or low yield requiring additional tests. All of the patient's questions were answered, patient is agreeable to proceed. Consent signed and in chart.   Thank you for this interesting consult.  I greatly enjoyed meeting Christine Garrett and look forward to participating in their care.  A copy of this report was sent to the requesting provider on this date.  Electronically Signed: Docia Barrier, PA 04/21/2017, 10:42 AM   I spent a total of    15 Minutes in face to face in clinical consultation, greater than 50% of which was counseling/coordinating care for multiple myeloma.

## 2017-04-21 NOTE — Discharge Instructions (Signed)
Moderate Conscious Sedation, Adult, Care After These instructions provide you with information about caring for yourself after your procedure. Your health care provider may also give you more specific instructions. Your treatment has been planned according to current medical practices, but problems sometimes occur. Call your health care provider if you have any problems or questions after your procedure. What can I expect after the procedure? After your procedure, it is common:  To feel sleepy for several hours.  To feel clumsy and have poor balance for several hours.  To have poor judgment for several hours.  To vomit if you eat too soon.  Follow these instructions at home: For at least 24 hours after the procedure:   Do not: ? Participate in activities where you could fall or become injured. ? Drive. ? Use heavy machinery. ? Drink alcohol. ? Take sleeping pills or medicines that cause drowsiness. ? Make important decisions or sign legal documents. ? Take care of children on your own.  Rest. Eating and drinking  Follow the diet recommended by your health care provider.  If you vomit: ? Drink water, juice, or soup when you can drink without vomiting. ? Make sure you have little or no nausea before eating solid foods. General instructions  Have a responsible adult stay with you until you are awake and alert.  Take over-the-counter and prescription medicines only as told by your health care provider.  If you smoke, do not smoke without supervision.  Keep all follow-up visits as told by your health care provider. This is important. Contact a health care provider if:  You keep feeling nauseous or you keep vomiting.  You feel light-headed.  You develop a rash.  You have a fever. Get help right away if:  You have trouble breathing. This information is not intended to replace advice given to you by your health care provider. Make sure you discuss any questions you have  with your health care provider. Document Released: 03/24/2013 Document Revised: 11/06/2015 Document Reviewed: 09/23/2015 Elsevier Interactive Patient Education  2018 [place].   Bone Marrow Aspiration and Bone Marrow Biopsy, Adult, Care After This sheet gives you information about how to care for yourself after your procedure. Your health care provider may also give you more specific instructions. If you have problems or questions, contact your health care provider. What can I expect after the procedure? After the procedure, it is common to have:  Mild pain and tenderness.  Swelling.  Bruising.  Follow these instructions at home:  Take over-the-counter or prescription medicines only as told by your health care provider.  Do not take baths, swim, or use a hot tub until your health care provider approves. Ask if you can take a shower or have a sponge bath.  You may shower tomorrow 04/22/17  Follow instructions from your health care provider about how to take care of the puncture site. Make sure you: ? Wash your hands with soap and water before you change your bandage (dressing). If soap and water are not available, use hand sanitizer. ? Change your dressing as told by your health care provider.  You may remove dressing tomorrow. 04/22/17  Check your puncture siteevery day for signs of infection. Check for: ? More redness, swelling, or pain. ? More fluid or blood. ? Warmth. ? Pus or a bad smell.  Return to your normal activities as told by your health care provider. Ask your health care provider what activities are safe for you.  Do not  drive for 24 hours if you were given a medicine to help you relax (sedative).  Keep all follow-up visits as told by your health care provider. This is important. Contact a health care provider if:  You have more redness, swelling, or pain around the puncture site.  You have more fluid or blood coming from the puncture site.  Your puncture  site feels warm to the touch.  You have pus or a bad smell coming from the puncture site.  You have a fever.  Your pain is not controlled with medicine. This information is not intended to replace advice given to you by your health care provider. Make sure you discuss any questions you have with your health care provider. Document Released: 12/21/2004 Document Revised: 12/22/2015 Document Reviewed: 11/15/2015 Elsevier Interactive Patient Education  2018 [employer].

## 2017-04-22 ENCOUNTER — Other Ambulatory Visit: Payer: Self-pay

## 2017-04-22 DIAGNOSIS — C9000 Multiple myeloma not having achieved remission: Secondary | ICD-10-CM

## 2017-04-22 MED ORDER — LENALIDOMIDE 25 MG PO CAPS: ORAL_CAPSULE | ORAL | 0 refills | Status: DC

## 2017-04-24 ENCOUNTER — Ambulatory Visit (HOSPITAL_BASED_OUTPATIENT_CLINIC_OR_DEPARTMENT_OTHER): Payer: Medicare Other

## 2017-04-24 ENCOUNTER — Telehealth: Payer: Self-pay | Admitting: Hematology and Oncology

## 2017-04-24 ENCOUNTER — Ambulatory Visit (HOSPITAL_BASED_OUTPATIENT_CLINIC_OR_DEPARTMENT_OTHER): Payer: Medicare Other | Admitting: Hematology and Oncology

## 2017-04-24 ENCOUNTER — Other Ambulatory Visit (HOSPITAL_BASED_OUTPATIENT_CLINIC_OR_DEPARTMENT_OTHER): Payer: Medicare Other

## 2017-04-24 ENCOUNTER — Ambulatory Visit: Payer: Medicare Other

## 2017-04-24 ENCOUNTER — Encounter: Payer: Self-pay | Admitting: Hematology and Oncology

## 2017-04-24 VITALS — BP 127/66 | HR 71 | Temp 98.5°F | Resp 20 | Ht 69.0 in | Wt 206.1 lb

## 2017-04-24 DIAGNOSIS — C9000 Multiple myeloma not having achieved remission: Secondary | ICD-10-CM

## 2017-04-24 DIAGNOSIS — C9001 Multiple myeloma in remission: Secondary | ICD-10-CM

## 2017-04-24 DIAGNOSIS — Z5112 Encounter for antineoplastic immunotherapy: Secondary | ICD-10-CM

## 2017-04-24 DIAGNOSIS — Z5111 Encounter for antineoplastic chemotherapy: Secondary | ICD-10-CM

## 2017-04-24 LAB — CBC WITH DIFFERENTIAL/PLATELET
BASO%: 1.8 % (ref 0.0–2.0)
Basophils Absolute: 0.1 10*3/uL (ref 0.0–0.1)
EOS%: 7.5 % — AB (ref 0.0–7.0)
Eosinophils Absolute: 0.4 10*3/uL (ref 0.0–0.5)
HCT: 32.3 % — ABNORMAL LOW (ref 34.8–46.6)
HGB: 10.8 g/dL — ABNORMAL LOW (ref 11.6–15.9)
LYMPH%: 42.5 % (ref 14.0–49.7)
MCH: 30.6 pg (ref 25.1–34.0)
MCHC: 33.4 g/dL (ref 31.5–36.0)
MCV: 91.5 fL (ref 79.5–101.0)
MONO#: 0.4 10*3/uL (ref 0.1–0.9)
MONO%: 8.3 % (ref 0.0–14.0)
NEUT%: 39.9 % (ref 38.4–76.8)
NEUTROS ABS: 2 10*3/uL (ref 1.5–6.5)
PLATELETS: 255 10*3/uL (ref 145–400)
RBC: 3.53 10*6/uL — AB (ref 3.70–5.45)
RDW: 14.5 % (ref 11.2–14.5)
WBC: 5.1 10*3/uL (ref 3.9–10.3)
lymph#: 2.2 10*3/uL (ref 0.9–3.3)

## 2017-04-24 LAB — COMPREHENSIVE METABOLIC PANEL
ALBUMIN: 3.6 g/dL (ref 3.5–5.0)
ALK PHOS: 64 U/L (ref 40–150)
ALT: 10 U/L (ref 0–55)
AST: 9 U/L (ref 5–34)
Anion Gap: 11 mEq/L (ref 3–11)
BILIRUBIN TOTAL: 0.56 mg/dL (ref 0.20–1.20)
BUN: 11.6 mg/dL (ref 7.0–26.0)
CALCIUM: 9 mg/dL (ref 8.4–10.4)
CO2: 23 mEq/L (ref 22–29)
CREATININE: 1 mg/dL (ref 0.6–1.1)
Chloride: 104 mEq/L (ref 98–109)
EGFR: >60 mL/min/{1.73_m2} (ref >60)
GLUCOSE: 110 mg/dL (ref 70–140)
POTASSIUM: 3.3 meq/L — AB (ref 3.5–5.1)
SODIUM: 138 meq/L (ref 136–145)
TOTAL PROTEIN: 6.7 g/dL (ref 6.4–8.3)

## 2017-04-24 LAB — MAGNESIUM: Magnesium: 2 mg/dl (ref 1.5–2.5)

## 2017-04-24 MED ORDER — PROCHLORPERAZINE MALEATE 10 MG PO TABS
ORAL_TABLET | ORAL | Status: AC
  Filled 2017-04-24: qty 1

## 2017-04-24 MED ORDER — SODIUM CHLORIDE 0.9% FLUSH
10.0000 mL | INTRAVENOUS | Status: DC | PRN
  Administered 2017-04-24: 10 mL via INTRAVENOUS
  Filled 2017-04-24: qty 10

## 2017-04-24 MED ORDER — ZOLEDRONIC ACID 4 MG/100ML IV SOLN
4.0000 mg | Freq: Once | INTRAVENOUS | Status: AC
  Administered 2017-04-24: 4 mg via INTRAVENOUS
  Filled 2017-04-24: qty 100

## 2017-04-24 MED ORDER — HEPARIN SOD (PORK) LOCK FLUSH 100 UNIT/ML IV SOLN
500.0000 [IU] | Freq: Once | INTRAVENOUS | Status: AC | PRN
  Administered 2017-04-24: 500 [IU] via INTRAVENOUS
  Filled 2017-04-24: qty 5

## 2017-04-24 MED ORDER — PROCHLORPERAZINE MALEATE 10 MG PO TABS
10.0000 mg | ORAL_TABLET | Freq: Once | ORAL | Status: AC
  Administered 2017-04-24: 10 mg via ORAL

## 2017-04-24 MED ORDER — BORTEZOMIB CHEMO SQ INJECTION 3.5 MG (2.5MG/ML)
1.0000 mg/m2 | Freq: Once | INTRAMUSCULAR | Status: AC
  Administered 2017-04-24: 2.25 mg via SUBCUTANEOUS
  Filled 2017-04-24: qty 2.25

## 2017-04-24 MED ORDER — SODIUM CHLORIDE 0.9 % IV SOLN
Freq: Once | INTRAVENOUS | Status: AC
  Administered 2017-04-24: 13:00:00 via INTRAVENOUS

## 2017-04-24 NOTE — Telephone Encounter (Signed)
Patient is aware of appt added for 11/15 - okay per Marshall Cork

## 2017-04-24 NOTE — Patient Instructions (Signed)
[facility] Discharge Instructions for Patients Receiving Chemotherapy  Today you received the following chemotherapy agents Velcade.  To help prevent nausea and vomiting after your treatment, we encourage you to take your nausea medication as directed.  If you develop nausea and vomiting that is not controlled by your nausea medication, call the clinic.   BELOW ARE SYMPTOMS THAT SHOULD BE REPORTED IMMEDIATELY:  *FEVER GREATER THAN 100.5 F  *CHILLS WITH OR WITHOUT FEVER  NAUSEA AND VOMITING THAT IS NOT CONTROLLED WITH YOUR NAUSEA MEDICATION  *UNUSUAL SHORTNESS OF BREATH  *UNUSUAL BRUISING OR BLEEDING  TENDERNESS IN MOUTH AND THROAT WITH OR WITHOUT PRESENCE OF ULCERS  *URINARY PROBLEMS  *BOWEL PROBLEMS  UNUSUAL RASH Items with * indicate a potential emergency and should be followed up as soon as possible.  Feel free to call the clinic should you have any questions or concerns. The clinic phone number is [phone].  Please show the CHEMO ALERT CARD at check-in to the Emergency Department and triage nurse.   

## 2017-04-24 NOTE — Telephone Encounter (Signed)
Scheduled appt per 11/8 los - unable to schedule patient for next week due to capped day - sent message to [facility] - patient aware - Gave patient AVS and calender per los.

## 2017-04-25 LAB — KAPPA/LAMBDA LIGHT CHAINS
IG KAPPA FREE LIGHT CHAIN: 15.5 mg/L (ref 3.3–19.4)
Ig Lambda Free Light Chain: 14.2 mg/L (ref 5.7–26.3)
Kappa/Lambda FluidC Ratio: 1.09 (ref 0.26–1.65)

## 2017-04-25 LAB — PHOSPHORUS: Phosphorus, Ser: 3 mg/dL (ref 2.5–4.5)

## 2017-04-28 LAB — MULTIPLE MYELOMA PANEL, SERUM
ALBUMIN SERPL ELPH-MCNC: 3.4 g/dL (ref 2.9–4.4)
ALPHA2 GLOB SERPL ELPH-MCNC: 0.9 g/dL (ref 0.4–1.0)
Albumin/Glob SerPl: 1.3 (ref 0.7–1.7)
Alpha 1: 0.2 g/dL (ref 0.0–0.4)
B-GLOBULIN SERPL ELPH-MCNC: 0.8 g/dL (ref 0.7–1.3)
Gamma Glob SerPl Elph-Mcnc: 0.7 g/dL (ref 0.4–1.8)
Globulin, Total: 2.7 g/dL (ref 2.2–3.9)
IGG (IMMUNOGLOBIN G), SERUM: 675 mg/dL — AB (ref 700–1600)
IgA, Qn, Serum: 145 mg/dL (ref 87–352)
IgM, Qn, Serum: 40 mg/dL (ref 26–217)
M PROTEIN SERPL ELPH-MCNC: 0.3 g/dL — AB
TOTAL PROTEIN: 6.1 g/dL (ref 6.0–8.5)

## 2017-05-01 ENCOUNTER — Other Ambulatory Visit (HOSPITAL_BASED_OUTPATIENT_CLINIC_OR_DEPARTMENT_OTHER): Payer: Medicare Other

## 2017-05-01 ENCOUNTER — Ambulatory Visit: Payer: Medicare Other

## 2017-05-01 ENCOUNTER — Encounter (HOSPITAL_COMMUNITY): Payer: Self-pay | Admitting: Emergency Medicine

## 2017-05-01 ENCOUNTER — Ambulatory Visit (HOSPITAL_BASED_OUTPATIENT_CLINIC_OR_DEPARTMENT_OTHER): Payer: Medicare Other

## 2017-05-01 ENCOUNTER — Other Ambulatory Visit (HOSPITAL_COMMUNITY)
Admission: AD | Admit: 2017-05-01 | Discharge: 2017-05-01 | Disposition: A | Discharge status: Home / Self Care | Payer: Medicare Other | Source: Ambulatory Visit | Attending: Hematology and Oncology | Admitting: Hematology and Oncology

## 2017-05-01 ENCOUNTER — Emergency Department (HOSPITAL_COMMUNITY)
Admission: EM | Admit: 2017-05-01 | Discharge: 2017-05-01 | Disposition: A | Discharge status: Home / Self Care | Payer: Medicare Other | Attending: Emergency Medicine | Admitting: Emergency Medicine

## 2017-05-01 VITALS — BP 102/61 | HR 72 | Temp 99.2°F | Resp 16

## 2017-05-01 DIAGNOSIS — Z7984 Long term (current) use of oral hypoglycemic drugs: Secondary | ICD-10-CM | POA: Diagnosis not present

## 2017-05-01 DIAGNOSIS — R531 Weakness: Secondary | ICD-10-CM

## 2017-05-01 DIAGNOSIS — I509 Heart failure, unspecified: Secondary | ICD-10-CM | POA: Diagnosis not present

## 2017-05-01 DIAGNOSIS — R110 Nausea: Secondary | ICD-10-CM

## 2017-05-01 DIAGNOSIS — Z87891 Personal history of nicotine dependence: Secondary | ICD-10-CM | POA: Diagnosis not present

## 2017-05-01 DIAGNOSIS — E1140 Type 2 diabetes mellitus with diabetic neuropathy, unspecified: Secondary | ICD-10-CM | POA: Diagnosis not present

## 2017-05-01 DIAGNOSIS — Z79899 Other long term (current) drug therapy: Secondary | ICD-10-CM | POA: Insufficient documentation

## 2017-05-01 DIAGNOSIS — E860 Dehydration: Secondary | ICD-10-CM | POA: Insufficient documentation

## 2017-05-01 DIAGNOSIS — C9000 Multiple myeloma not having achieved remission: Secondary | ICD-10-CM | POA: Insufficient documentation

## 2017-05-01 DIAGNOSIS — R42 Dizziness and giddiness: Secondary | ICD-10-CM | POA: Diagnosis not present

## 2017-05-01 DIAGNOSIS — I110 Hypertensive heart disease with heart failure: Secondary | ICD-10-CM | POA: Insufficient documentation

## 2017-05-01 HISTORY — DX: Malignant (primary) neoplasm, unspecified: C80.1

## 2017-05-01 LAB — URINALYSIS, MICROSCOPIC - CHCC
Bilirubin (Urine): NEGATIVE
Blood: NEGATIVE
Glucose: NEGATIVE mg/dL
KETONES: NEGATIVE mg/dL
Leukocyte Esterase: NEGATIVE
Nitrite: NEGATIVE
RBC / HPF: NEGATIVE (ref 0–2)
Specific Gravity, Urine: 1.025 (ref 1.003–1.035)
UROBILINOGEN UR: 0.2 mg/dL (ref 0.2–1)
pH: 5 (ref 4.6–8.0)

## 2017-05-01 LAB — INFLUENZA PANEL BY PCR (TYPE A & B)
INFLAPCR: NEGATIVE
Influenza B By PCR: NEGATIVE

## 2017-05-01 LAB — BASIC METABOLIC PANEL
ANION GAP: 11 meq/L (ref 3–11)
BUN: 20.8 mg/dL (ref 7.0–26.0)
CALCIUM: 9.4 mg/dL (ref 8.4–10.4)
CO2: 23 mEq/L (ref 22–29)
Chloride: 103 mEq/L (ref 98–109)
Creatinine: 1.5 mg/dL — ABNORMAL HIGH (ref 0.6–1.1)
EGFR: 46 mL/min/{1.73_m2} — AB (ref >60)
Glucose: 146 mg/dl — ABNORMAL HIGH (ref 70–140)
POTASSIUM: 3.6 meq/L (ref 3.5–5.1)
Sodium: 136 mEq/L (ref 136–145)

## 2017-05-01 MED ORDER — METOCLOPRAMIDE HCL 10 MG PO TABS: 10.0000 mg | ORAL_TABLET | Freq: Four times a day (QID) | ORAL | 0 refills | Status: DC

## 2017-05-01 MED ORDER — METOCLOPRAMIDE HCL 5 MG/ML IJ SOLN
10.0000 mg | Freq: Once | INTRAMUSCULAR | Status: AC
  Administered 2017-05-01: 10 mg via INTRAVENOUS
  Filled 2017-05-01: qty 2

## 2017-05-01 MED ORDER — SODIUM CHLORIDE 0.9 % IV SOLN: Freq: Once | INTRAVENOUS | Status: DC

## 2017-05-01 MED ORDER — SODIUM CHLORIDE 0.9 % IV SOLN
Freq: Once | INTRAVENOUS | Status: AC
  Administered 2017-05-01: 15:00:00 via INTRAVENOUS

## 2017-05-01 MED ORDER — ONDANSETRON HCL 4 MG/2ML IJ SOLN
8.0000 mg | Freq: Once | INTRAMUSCULAR | Status: AC
  Administered 2017-05-01: 8 mg via INTRAVENOUS

## 2017-05-01 MED ORDER — HEPARIN SOD (PORK) LOCK FLUSH 100 UNIT/ML IV SOLN
500.0000 [IU] | Freq: Once | INTRAVENOUS | Status: AC
  Administered 2017-05-01: 500 [IU]
  Filled 2017-05-01: qty 5

## 2017-05-01 MED ORDER — THIAMINE HCL 100 MG/ML IJ SOLN
100.0000 mg | Freq: Once | INTRAMUSCULAR | Status: AC
  Administered 2017-05-01: 100 mg via INTRAVENOUS
  Filled 2017-05-01: qty 2

## 2017-05-01 MED ORDER — SODIUM CHLORIDE 0.9 % IV SOLN
INTRAVENOUS | Status: AC
  Administered 2017-05-01: 14:00:00 via INTRAVENOUS

## 2017-05-01 MED ORDER — SODIUM CHLORIDE 0.9% FLUSH
10.0000 mL | INTRAVENOUS | Status: DC | PRN
  Administered 2017-05-01: 10 mL via INTRAVENOUS
  Filled 2017-05-01: qty 10

## 2017-05-01 NOTE — ED Notes (Signed)
Attempted to assist patient to fully undress. Patient refused to take off pants. States "I am very particular."

## 2017-05-01 NOTE — ED Notes (Signed)
Bed: WA07 Expected date:  Expected time:  Means of arrival:  Comments: Cancer center

## 2017-05-01 NOTE — Progress Notes (Signed)
Pt receiving 2nd liter of NS per Dr. Lebron Conners. Dr. Lebron Conners ordered for pt to report to ER for possible dehydration for further workup.  PT transported to ER via wheelchair with staff assist and bedside report given. Pt stable at time of transfer.

## 2017-05-01 NOTE — ED Provider Notes (Signed)
[facility] DEPT Provider Note   CSN: [id number] Arrival date & time: 05/01/17  1538     History   Chief Complaint Chief Complaint  Patient presents with  . Nausea    HPI Christine Garrett is a 61 y.o. female.  HPI Complains of nausea for the past 3 days.  Constant.  Made worse with sitting up and improved with lying down.  She denies pain anywhere denies fever.  She does admit to diminished appetite.  But she has been drinking liquids.   she had diarrhea 2 days ago which has resolved.  No recent travel or antibiotics.  Other associated symptoms include lightheadedness with standing.  No fever no headache no abdominal pain no chest pain no other associated symptoms.  Treated with Zofran and Compazine with transient relief.  Patient had lab work at [facility] earlier  including basic metabolic panel remarkable for glucose 146 creatinine 1.5 otherwise normal.  Urinalysis grossly normal.  Influenza panel Past Medical History:  Diagnosis Date  . Arthritis    knees  . Cancer ([place])   . Carpal tunnel syndrome   . CHF (congestive heart failure) ([place])   . Diabetes mellitus    type 2  . GERD (gastroesophageal reflux disease)   . Heart murmur    "Little, No concerns" per Dr  Dannielle Burn pt reported.  . Hypertension    controlled using a guided approch with plasma renin activity  . Neuropathy   . Peripheral vascular disease ([place])   . Sleep apnea    Uses CPAP    Patient Active Problem List   Diagnosis Date Noted  . Phlebitis and thrombophlebitis of superficial vessels of right lower extremity 01/24/2017  . Multiple myeloma not having achieved remission ([place]) 01/06/2017  . Neuropathy 01/06/2017  . Hypercholesteremia 11/04/2016  . Class 1 obesity due to excess calories with serious comorbidity and body mass index (BMI) of 31.0 to 31.9 in adult 11/04/2016  . Gastroesophageal reflux disease without esophagitis 10/30/2016  . Abdominal pain, chronic,  epigastric 10/30/2016  . OBSTRUCTIVE SLEEP APNEA 08/15/2009  . BEN HTN HEART DISEASE WITHOUT HEART FAIL 08/15/2009  . Uncontrolled type 2 diabetes mellitus with complication, without long-term current use of insulin ([place]) 07/27/2009  . TOBACCO ABUSE 07/27/2009  . Essential hypertension, benign 07/27/2009    Past Surgical History:  Procedure Laterality Date  . BIOPSY  11/28/2016   Procedure: BIOPSY;  Surgeon: Rogene Houston, MD;  [facility];  Service: Endoscopy;;  gastric  . [place]  . CERVICAL CONE BIOPSY     cervical lesion  . ESOPHAGOGASTRODUODENOSCOPY (EGD) WITH PROPOFOL N/A 11/28/2016   Procedure: ESOPHAGOGASTRODUODENOSCOPY (EGD) WITH PROPOFOL;  Surgeon: Rogene Houston, MD;  [facility];  Service: Endoscopy;  Laterality: N/A;  9:25  . IR FLUORO GUIDE PORT INSERTION RIGHT  01/29/2017  . IR US GUIDE VASC ACCESS RIGHT  01/29/2017  . lipoma removal     right shoulder 2001  . MULTIPLE EXTRACTIONS WITH ALVEOLOPLASTY Bilateral 08/24/2012   Procedure: MULTIPLE EXTRACTION WITH ALVEOLOPLASTY BIOPSY OF RIGHT AND LEFT MANDIBLE ;  Surgeon: Gae Bon, DDS;  Location: [facility];  Service: Oral Surgery;  Laterality: Bilateral;  . POSTERIOR LUMBAR FUSION 4 LEVEL N/A 12/26/2016   Procedure: THORACIC EIGHT TUMOR RESECTION, THORACIC SIX- THORACIC TEN POSTERIOR SPINAL FUSION;  Surgeon: Ashok Pall, MD;  Location: [facility];  Service: Neurosurgery;  Laterality: N/A;  THORACIC 8 TUMOR RESECTION, THORACIC 6- THORACIC 10  POSTERIOR SPINAL FUSION  . ROTATOR CUFF REPAIR     right shoulder  . TUBAL LIGATION      OB History    No data available       Home Medications    Prior to Admission medications   Medication Sig Start Date End Date Taking? Authorizing Provider  acyclovir (ZOVIRAX) 400 MG tablet Take 1 tablet (400 mg total) by mouth 2 (two) times daily. 01/22/17  Yes [name], MD  chlorthalidone (HYGROTON) 25 MG tablet Take 25 mg by mouth 2 (two) times daily.   11/17/16  Yes [provider]  dexamethasone (DECADRON) 4 MG tablet Take 10 tablets (40 mg) on days 1, 8, and 15 of chemo. Repeat every 21 days. 01/22/17  Yes [name], MD  diltiazem (CARDIZEM CD) 300 MG 24 hr capsule Take 327ms by mouth daily 10/19/16  Yes [provider]  enalapril (VASOTEC) 20 MG tablet Take 20 mg by mouth 2 (two) times daily.    Yes [provider]  insulin degludec (TRESIBA FLEXTOUCH) 100 UNIT/ML SOPN FlexTouch Pen Inject 0.5 mLs (50 Units total) into the skin daily at 10 pm. 11/20/16  Yes Nida, GMarella Chimes MD  lenalidomide (REVLIMID) 25 MG capsule Take one capsule (249m by mouth once daily on days 1-14 every 21 days. Auth# [id number]/[id number] 04/22/17  Yes PeArdath SaxMD  lidocaine-prilocaine (EMLA) cream Apply to affected area once 01/22/17  Yes Perlov, MiMarinell BlightMD  LORazepam (ATIVAN) 0.5 MG tablet Take 1 tablet (0.5 mg total) by mouth every 6 (six) hours as needed (Nausea or vomiting). 01/22/17  Yes PeArdath SaxMD  metFORMIN (GLUCOPHAGE) 1000 MG tablet Take 1,000 mg by mouth 2 (two) times daily.    Yes [provider]  ondansetron (ZOFRAN) 8 MG tablet Take 1 tablet (8 mg total) by mouth 2 (two) times daily as needed (Nausea or vomiting). 01/22/17  Yes Perlov, MiMarinell BlightMD  oxyCODONE (OXY IR/ROXICODONE) 5 MG immediate release tablet Take 1 tablet (5 mg total) by mouth every 4 (four) hours as needed for severe pain. 03/13/17  Yes PeArdath SaxMD  prochlorperazine (COMPAZINE) 10 MG tablet Take 1 tablet (10 mg total) by mouth every 6 (six) hours as needed (Nausea or vomiting). 01/22/17  Yes PeArdath SaxMD  rivaroxaban (XARELTO) 10 MG TABS tablet Take 1 tablet (10 mg total) by mouth daily. 01/22/17  Yes Perlov, MiMarinell BlightMD  tiZANidine (ZANAFLEX) 4 MG tablet Take 1 tablet (4 mg total) by mouth every 6 (six) hours as needed for muscle spasms. 12/30/16  Yes CaAshok PallMD  gabapentin (NEURONTIN) 300 MG capsule Take 1  capsule (300 mg total) by mouth 3 (three) times daily. 01/30/17 04/21/17  PeArdath SaxMD  Insulin Pen Needle (B-D ULTRAFINE III SHORT PEN) 31G X 8 MM MISC 1 each by Does not apply route as directed. 11/13/16   NiCassandria AngerMD  KLOR-CON M20 20 MEQ tablet TAKE 1 TABLET BY MOUTH EVERY DAY Patient not taking: Reported on 05/01/2017 04/14/17   ReRogene HoustonMD  nicotine (NICODERM CQ - DOSED IN MG/24 HOURS) 14 mg/24hr patch Place 1 patch (14 mg total) onto the skin daily. Patient not taking: Reported on 05/01/2017 03/13/17   PeArdath SaxMD  nicotine polacrilex (COMMIT) 2 MG lozenge Take 1 lozenge (2 mg total) by mouth as needed for smoking cessation. Patient not taking: Reported on 05/01/2017 03/13/17   PeArdath SaxMD  Family History Family History  Problem Relation Age of Onset  . Diabetes Mother   . Hypertension Mother   . Heart disease Mother   . Diabetes Father   . Heart disease Sister   . Diabetes Sister     Social History Social History   Tobacco Use  . Smoking status: Former Smoker    Packs/day: 0.50    Years: 6.00    Pack years: 3.00    Types: Cigarettes    Last attempt to quit: 11/23/2011    Years since quitting: 5.4  . Smokeless tobacco: Never Used  . Tobacco comment: quit summer 2013  Substance Use Topics  . Alcohol use: No    Alcohol/week: 3.6 oz    Types: 6 Cans of beer per week  . Drug use: No     Allergies   No known allergies   Review of Systems Review of Systems  Constitutional: Negative.   HENT: Negative.   Respiratory: Negative.   Cardiovascular: Negative.   Gastrointestinal: Positive for nausea.  Musculoskeletal: Negative.   Skin: Negative.   Allergic/Immunologic: Positive for immunocompromised state.       Cancer patient  Neurological: Negative.   Psychiatric/Behavioral: Negative.   All other systems reviewed and are negative.    Physical Exam Updated Vital Signs BP 129/82   Pulse 71   Temp 98.3 F (36.8  C) (Oral)   Resp (!) 21   SpO2 100%   Physical Exam  Constitutional: She appears well-developed and well-nourished.  HENT:  Head: Normocephalic and atraumatic.  Eyes: Conjunctivae are normal. Pupils are equal, round, and reactive to light.  Neck: Neck supple. No tracheal deviation present. No thyromegaly present.  Cardiovascular: Normal rate and regular rhythm.  No murmur heard. Pulmonary/Chest: Effort normal and breath sounds normal.  Abdominal: Soft. Bowel sounds are normal. She exhibits no distension. There is no tenderness.  Musculoskeletal: Normal range of motion. She exhibits no edema or tenderness.  Neurological: She is alert. Coordination normal.  Skin: Skin is warm and dry. No rash noted.  Psychiatric: She has a normal mood and affect.  Nursing note and vitals reviewed.    ED Treatments / Results  Labs (all labs ordered are listed, but only abnormal results are displayed) Labs Reviewed - No data to display  EKG  EKG Interpretation None       Radiology No results found.  Procedures Procedures (including critical care time)  Medications Ordered in ED Medications  metoCLOPramide (REGLAN) injection 10 mg (10 mg Intravenous Given 05/01/17 1633)   5:05 PM feels improved after treatment with intravenous hydration and intravenous Reglan.  She is not lightheaded on standing.  She was able to eat without nausea or vomiting.  She feels very to go home. Plan prescription Reglan.  Encourage oral hydration.  Follow-up with oncologist as needed.  Discontinue Zofran as she had long QT on prior EKG  Initial Impression / Assessment and Plan / ED Course  I have reviewed the triage vital signs and the nursing notes.  Pertinent labs & imaging results that were available during my care of the patient were reviewed by me and considered in my medical decision making (see chart for details).       Final Clinical Impressions(s) / ED Diagnoses  Diagnoses #1 nausea #2  dehydration Final diagnoses:  None    ED Discharge Orders    None       Orlie Dakin, MD 05/01/17 [phone]

## 2017-05-01 NOTE — ED Triage Notes (Signed)
Patient from [facility], c/o nausea since Monday. Currently being treated for bone cancer. Last treatment 11/8. Denies chest pain and SOB. Port accessed in cancer center. 1537ml NS with PTA. Sent for further evaluation for possible dehydration.

## 2017-05-01 NOTE — Patient Instructions (Signed)
Dehydration, Adult Dehydration is a condition in which there is not enough fluid or water in the body. This happens when you lose more fluids than you take in. Important organs, such as the kidneys, brain, and heart, cannot function without a proper amount of fluids. Any loss of fluids from the body can lead to dehydration. Dehydration can range from mild to severe. This condition should be treated right away to prevent it from becoming severe. What are the causes? This condition may be caused by:  Vomiting.  Diarrhea.  Excessive sweating, such as from heat exposure or exercise.  Not drinking enough fluid, especially: ? When ill. ? While doing activity that requires a lot of energy.  Excessive urination.  Fever.  Infection.  Certain medicines, such as medicines that cause the body to lose excess fluid (diuretics).  Inability to access safe drinking water.  Reduced physical ability to get adequate water and food.  What increases the risk? This condition is more likely to develop in people:  Who have a poorly controlled long-term (chronic) illness, such as diabetes, heart disease, or kidney disease.  Who are age 65 or older.  Who are disabled.  Who live in a place with high altitude.  Who play endurance sports.  What are the signs or symptoms? Symptoms of mild dehydration may include:  Thirst.  Dry lips.  Slightly dry mouth.  Dry, warm skin.  Dizziness. Symptoms of moderate dehydration may include:  Very dry mouth.  Muscle cramps.  Dark urine. Urine may be the color of tea.  Decreased urine production.  Decreased tear production.  Heartbeat that is irregular or faster than normal (palpitations).  Headache.  Light-headedness, especially when you stand up from a sitting position.  Fainting (syncope). Symptoms of severe dehydration may include:  Changes in skin, such as: ? Cold and clammy skin. ? Blotchy (mottled) or pale skin. ? Skin that does  not quickly return to normal after being lightly pinched and released (poor skin turgor).  Changes in body fluids, such as: ? Extreme thirst. ? No tear production. ? Inability to sweat when body temperature is high, such as in hot weather. ? Very little urine production.  Changes in vital signs, such as: ? Weak pulse. ? Pulse that is more than 100 beats a minute when sitting still. ? Rapid breathing. ? Low blood pressure.  Other changes, such as: ? Sunken eyes. ? Cold hands and feet. ? Confusion. ? Lack of energy (lethargy). ? Difficulty waking up from sleep. ? Short-term weight loss. ? Unconsciousness. How is this diagnosed? This condition is diagnosed based on your symptoms and a physical exam. Blood and urine tests may be done to help confirm the diagnosis. How is this treated? Treatment for this condition depends on the severity. Mild or moderate dehydration can often be treated at home. Treatment should be started right away. Do not wait until dehydration becomes severe. Severe dehydration is an emergency and it needs to be treated in a hospital. Treatment for mild dehydration may include:  Drinking more fluids.  Replacing salts and minerals in your blood (electrolytes) that you may have lost. Treatment for moderate dehydration may include:  Drinking an oral rehydration solution (ORS). This is a drink that helps you replace fluids and electrolytes (rehydrate). It can be found at pharmacies and retail stores. Treatment for severe dehydration may include:  Receiving fluids through an IV tube.  Receiving an electrolyte solution through a feeding tube that is passed through your nose   and into your stomach (nasogastric tube, or NG tube).  Correcting any abnormalities in electrolytes.  Treating the underlying cause of dehydration. Follow these instructions at home:  If directed by your health care provider, drink an ORS: ? Make an ORS by following instructions on the  package. ? Start by drinking small amounts, about  cup (120 mL) every 5-10 minutes. ? Slowly increase how much you drink until you have taken the amount recommended by your health care provider.  Drink enough clear fluid to keep your urine clear or pale yellow. If you were told to drink an ORS, finish the ORS first, then start slowly drinking other clear fluids. Drink fluids such as: ? Water. Do not drink only water. Doing that can lead to having too little salt (sodium) in the body (hyponatremia). ? Ice chips. ? Fruit juice that you have added water to (diluted fruit juice). ? Low-calorie sports drinks.  Avoid: ? Alcohol. ? Drinks that contain a lot of sugar. These include high-calorie sports drinks, fruit juice that is not diluted, and soda. ? Caffeine. ? Foods that are greasy or contain a lot of fat or sugar.  Take over-the-counter and prescription medicines only as told by your health care provider.  Do not take sodium tablets. This can lead to having too much sodium in the body (hypernatremia).  Eat foods that contain a healthy balance of electrolytes, such as bananas, oranges, potatoes, tomatoes, and spinach.  Keep all follow-up visits as told by your health care provider. This is important. Contact a health care provider if:  You have abdominal pain that: ? Gets worse. ? Stays in one area (localizes).  You have a rash.  You have a stiff neck.  You are more irritable than usual.  You are sleepier or more difficult to wake up than usual.  You feel weak or dizzy.  You feel very thirsty.  You have urinated only a small amount of very dark urine over 6-8 hours. Get help right away if:  You have symptoms of severe dehydration.  You cannot drink fluids without vomiting.  Your symptoms get worse with treatment.  You have a fever.  You have a severe headache.  You have vomiting or diarrhea that: ? Gets worse. ? Does not go away.  You have blood or green matter  (bile) in your vomit.  You have blood in your stool. This may cause stool to look black and tarry.  You have not urinated in 6-8 hours.  You faint.  Your heart rate while sitting still is over 100 beats a minute.  You have trouble breathing. This information is not intended to replace advice given to you by your health care provider. Make sure you discuss any questions you have with your health care provider. Document Released: 06/03/2005 Document Revised: 12/29/2015 Document Reviewed: 07/28/2015 Elsevier Interactive Patient Education  2018 [employer]  

## 2017-05-01 NOTE — ED Provider Notes (Deleted)
[facility] DEPT Provider Note   CSN: [id number] Arrival date & time: 05/01/17  1538     History   Chief Complaint Chief Complaint  Patient presents with  . Nausea    HPI Christine Garrett is a 61 y.o. female.  HPI  Past Medical History:  Diagnosis Date  . Arthritis    knees  . Cancer ([place])   . Carpal tunnel syndrome   . CHF (congestive heart failure) ([place])   . Diabetes mellitus    type 2  . GERD (gastroesophageal reflux disease)   . Heart murmur    "Little, No concerns" per Dr  Dannielle Burn pt reported.  . Hypertension    controlled using a guided approch with plasma renin activity  . Neuropathy   . Peripheral vascular disease ([place])   . Sleep apnea    Uses CPAP    Patient Active Problem List   Diagnosis Date Noted  . Phlebitis and thrombophlebitis of superficial vessels of right lower extremity 01/24/2017  . Multiple myeloma not having achieved remission ([place]) 01/06/2017  . Neuropathy 01/06/2017  . Hypercholesteremia 11/04/2016  . Class 1 obesity due to excess calories with serious comorbidity and body mass index (BMI) of 31.0 to 31.9 in adult 11/04/2016  . Gastroesophageal reflux disease without esophagitis 10/30/2016  . Abdominal pain, chronic, epigastric 10/30/2016  . OBSTRUCTIVE SLEEP APNEA 08/15/2009  . BEN HTN HEART DISEASE WITHOUT HEART FAIL 08/15/2009  . Uncontrolled type 2 diabetes mellitus with complication, without long-term current use of insulin ([place]) 07/27/2009  . TOBACCO ABUSE 07/27/2009  . Essential hypertension, benign 07/27/2009    Past Surgical History:  Procedure Laterality Date  . BIOPSY  11/28/2016   Procedure: BIOPSY;  Surgeon: Rogene Houston, MD;  Location[facility];  Service: Endoscopy;;  gastric  . [place]  . CERVICAL CONE BIOPSY     cervical lesion  . ESOPHAGOGASTRODUODENOSCOPY (EGD) WITH PROPOFOL N/A 11/28/2016   Procedure: ESOPHAGOGASTRODUODENOSCOPY (EGD) WITH PROPOFOL;  Surgeon: Rogene Houston, MD;  [facility];  Service: Endoscopy;  Laterality: N/A;  9:25  . IR FLUORO GUIDE PORT INSERTION RIGHT  01/29/2017  . IR US GUIDE VASC ACCESS RIGHT  01/29/2017  . lipoma removal     right shoulder 2001  . MULTIPLE EXTRACTIONS WITH ALVEOLOPLASTY Bilateral 08/24/2012   Procedure: MULTIPLE EXTRACTION WITH ALVEOLOPLASTY BIOPSY OF RIGHT AND LEFT MANDIBLE ;  Surgeon: Gae Bon, DDS;  Location: [facility];  Service: Oral Surgery;  Laterality: Bilateral;  . POSTERIOR LUMBAR FUSION 4 LEVEL N/A 12/26/2016   Procedure: THORACIC EIGHT TUMOR RESECTION, THORACIC SIX- THORACIC TEN POSTERIOR SPINAL FUSION;  Surgeon: Ashok Pall, MD;  Location: [facility];  Service: Neurosurgery;  Laterality: N/A;  THORACIC 8 TUMOR RESECTION, THORACIC 6- THORACIC 10 POSTERIOR SPINAL FUSION  . ROTATOR CUFF REPAIR     right shoulder  . TUBAL LIGATION      OB History    No data available       Home Medications    Prior to Admission medications   Medication Sig Start Date End Date Taking? Authorizing Provider  acyclovir (ZOVIRAX) 400 MG tablet Take 1 tablet (400 mg total) by mouth 2 (two) times daily. 01/22/17  Yes [name], MD  chlorthalidone (HYGROTON) 25 MG tablet Take 25 mg by mouth 2 (two) times daily.  11/17/16  Yes [provider]  dexamethasone (DECADRON) 4 MG tablet Take 10 tablets (40 mg) on days 1, 8,  and 15 of chemo. Repeat every 21 days. 01/22/17  Yes [name], MD  diltiazem (CARDIZEM CD) 300 MG 24 hr capsule Take 310ms by mouth daily 10/19/16  Yes [provider]  enalapril (VASOTEC) 20 MG tablet Take 20 mg by mouth 2 (two) times daily.    Yes [provider]  insulin degludec (TRESIBA FLEXTOUCH) 100 UNIT/ML SOPN FlexTouch Pen Inject 0.5 mLs (50 Units total) into the skin daily at 10 pm. 11/20/16  Yes Nida, GMarella Chimes MD  lenalidomide (REVLIMID) 25 MG capsule Take one capsule (2101m by mouth once daily on days 1-14 every 21 days. Auth# [id number]/[id number]  04/22/17  Yes PeArdath SaxMD  lidocaine-prilocaine (EMLA) cream Apply to affected area once 01/22/17  Yes Perlov, MiMarinell BlightMD  LORazepam (ATIVAN) 0.5 MG tablet Take 1 tablet (0.5 mg total) by mouth every 6 (six) hours as needed (Nausea or vomiting). 01/22/17  Yes PeArdath SaxMD  metFORMIN (GLUCOPHAGE) 1000 MG tablet Take 1,000 mg by mouth 2 (two) times daily.    Yes [provider]  ondansetron (ZOFRAN) 8 MG tablet Take 1 tablet (8 mg total) by mouth 2 (two) times daily as needed (Nausea or vomiting). 01/22/17  Yes Perlov, MiMarinell BlightMD  oxyCODONE (OXY IR/ROXICODONE) 5 MG immediate release tablet Take 1 tablet (5 mg total) by mouth every 4 (four) hours as needed for severe pain. 03/13/17  Yes PeArdath SaxMD  prochlorperazine (COMPAZINE) 10 MG tablet Take 1 tablet (10 mg total) by mouth every 6 (six) hours as needed (Nausea or vomiting). 01/22/17  Yes PeArdath SaxMD  rivaroxaban (XARELTO) 10 MG TABS tablet Take 1 tablet (10 mg total) by mouth daily. 01/22/17  Yes Perlov, MiMarinell BlightMD  tiZANidine (ZANAFLEX) 4 MG tablet Take 1 tablet (4 mg total) by mouth every 6 (six) hours as needed for muscle spasms. 12/30/16  Yes CaAshok PallMD  gabapentin (NEURONTIN) 300 MG capsule Take 1 capsule (300 mg total) by mouth 3 (three) times daily. 01/30/17 04/21/17  PeArdath SaxMD  Insulin Pen Needle (B-D ULTRAFINE III SHORT PEN) 31G X 8 MM MISC 1 each by Does not apply route as directed. 11/13/16   NiCassandria AngerMD  KLOR-CON M20 20 MEQ tablet TAKE 1 TABLET BY MOUTH EVERY DAY Patient not taking: Reported on 05/01/2017 04/14/17   ReRogene HoustonMD  nicotine (NICODERM CQ - DOSED IN MG/24 HOURS) 14 mg/24hr patch Place 1 patch (14 mg total) onto the skin daily. Patient not taking: Reported on 05/01/2017 03/13/17   PeArdath SaxMD  nicotine polacrilex (COMMIT) 2 MG lozenge Take 1 lozenge (2 mg total) by mouth as needed for smoking cessation. Patient not taking: Reported on  05/01/2017 03/13/17   PeArdath SaxMD    Family History Family History  Problem Relation Age of Onset  . Diabetes Mother   . Hypertension Mother   . Heart disease Mother   . Diabetes Father   . Heart disease Sister   . Diabetes Sister     Social History Social History   Tobacco Use  . Smoking status: Former Smoker    Packs/day: 0.50    Years: 6.00    Pack years: 3.00    Types: Cigarettes    Last attempt to quit: 11/23/2011    Years since quitting: 5.4  . Smokeless tobacco: Never Used  . Tobacco comment: quit summer 2013  Substance Use Topics  . Alcohol use:  No    Alcohol/week: 3.6 oz    Types: 6 Cans of beer per week  . Drug use: No     Allergies   No known allergies   Review of Systems Review of Systems   Physical Exam Updated Vital Signs BP 122/63   Pulse 80   Temp 98.3 F (36.8 C) (Oral)   Resp 17   SpO2 99%   Physical Exam   ED Treatments / Results  Labs (all labs ordered are listed, but only abnormal results are displayed) Labs Reviewed - No data to display  EKG  EKG Interpretation None       Radiology No results found.  Procedures Procedures (including critical care time)  Medications Ordered in ED Medications  metoCLOPramide (REGLAN) injection 10 mg (10 mg Intravenous Given 05/01/17 1633)  thiamine (B-1) injection 100 mg (100 mg Intravenous Given 05/01/17 1727)  heparin lock flush 100 unit/mL (500 Units Intracatheter Given 05/01/17 1851)     Initial Impression / Assessment and Plan / ED Course  I have reviewed the triage vital signs and the nursing notes.  Pertinent labs & imaging results that were available during my care of the patient were reviewed by me and considered in my medical decision making (see chart for details).   6:50 PM feels much improved after treatment with intravenous Reglan and intravenous hydration.  She was hungry and ate without nausea or vomiting.  And not lightheaded on standing.  Plan  prescription Reglan.  I advised her to discontinue Zofran as she had long QT interval on prior EKG.  Encourage oral hydration.  Follow-up with oncologist as needed Final Clinical Impressions(s) / ED Diagnoses  Diagnoses #1 nausea #2 dehydration Final diagnoses:  None    ED Discharge Orders    None       Orlie Dakin, MD 05/01/17 1904

## 2017-05-01 NOTE — Patient Instructions (Signed)

## 2017-05-01 NOTE — Discharge Instructions (Signed)
Discontinue Zofran (odansetron) . Take the medication prescribed instead for nausea.Make sure that you drink at least six 8 ounce glasses of water each day in order to stay well-hydrated.  Follow-up with your cancer doctor as needed or return if concern for any reason

## 2017-05-01 NOTE — Progress Notes (Signed)
1330 - Patient reported increase in weakness, dizziness, diarrhea, and nausea. Dr. Lebron Conners notified. Per MD, no chemo today. 1L of NS over 1 hour. Urinalysis and Influenza swab ordered per Dr. Lebron Conners. Reassess after fluid completion. Patient voiced understanding.  Wylene Simmer, BSN, RN 05/01/2017

## 2017-05-01 NOTE — ED Notes (Signed)
Patient given sandwich and ginger ale 

## 2017-05-04 ENCOUNTER — Encounter: Payer: Self-pay | Admitting: Hematology and Oncology

## 2017-05-04 NOTE — Assessment & Plan Note (Addendum)
61 y.o. female who initially presented with significant weight loss in the context of H. pylori-associated gastritis and alcohol abuse. At the same time, an incidental discovery of a lower thoracic vertebral mass in the context of chronic numbness and weakness in the lower extremities. MRI of the thoracic spine demonstrated a destructive lesion at T8 level and patient underwent surgical treatment. 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 Staging evaluation conducted by our clinic confirmed presence of IgG kappa multiple myeloma. Cytogenetics studies demonstrated normal karyotype, FISH studies are positive for 13q34 & [id number] loss and no loss of p53. Based on initial information, patient is now staged at [facility].   Patient has received 4 cycles of systemic therapy and was reassessed with both PET/CT and a bone marrow biopsy.  Both studies are consistent with complete disease response to the instituted therapy.  Awaiting site and genetic and molecular analysis to confirm deep response to treatment.  At this time, I am awaiting decision from [facility] regarding patient's ability to undergo consolidation therapy with high-dose chemotherapy plus autologous stem cell rescue.  The excellent clearance of the bone marrow is very encouraging for utility of such treatment at this time.  In any case, I am planning to proceed with total of 6 cycles of chemotherapy at this time unless patient proceeds with the stem cell treatment.  Following the autologous stem cell procedure, I will await recommendations from [facility] to decide on whether maintenance therapy will be necessary.  Clinical evaluation lab work today are permissive to proceed with third cycle of treatment. bortezomib will be administered with those reduction down to 2m/m2 different suspicion for motor neuropathy contributing to lower extremity weakness.    Plan: --Proceed with Cycle #5 of RVd + zoledronic acid   --Supportive care with Bactrim and acyclovir to reduce the risk of PCP and VZV infections respectively; at the last visit, patient was started on prophylactic dose of Rivaroxaban (Xarelto) due to superficial venous thrombosis and as a prophylaxis for thrombosis due to use of lenalidomide. --Patient will return to our clinic in 3 weeks with lab work to monitor treatment toxicity & possible Cycle #6 of RVd

## 2017-05-04 NOTE — Progress Notes (Signed)
[place] [facility] Follow-up Visit:  Assessment: Multiple myeloma not having achieved remission [facility]) 61 y.o. female who initially presented with significant weight loss in the context of H. pylori-associated gastritis and alcohol abuse. At the same time, an incidental discovery of a lower thoracic vertebral mass in the context of chronic numbness and weakness in the lower extremities. MRI of the thoracic spine demonstrated a destructive lesion at T8 level and patient underwent surgical treatment. 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 Staging evaluation conducted by our clinic confirmed presence of IgG kappa multiple myeloma. Cytogenetics studies demonstrated normal karyotype, FISH studies are positive for 13q34 & [id number] loss and no loss of p53. Based on initial information, patient is now staged at [place] [facility].   Patient has received 4 cycles of systemic therapy and was reassessed with both PET/CT and a bone marrow biopsy.  Both studies are consistent with complete disease response to the instituted therapy.  Awaiting site and genetic and molecular analysis to confirm deep response to treatment.  At this time, I am awaiting decision from [facility] regarding patient's ability to undergo consolidation therapy with high-dose chemotherapy plus autologous stem cell rescue.  The excellent clearance of the bone marrow is very encouraging for utility of such treatment at this time.  In any case, I am planning to proceed with total of 6 cycles of chemotherapy at this time unless patient proceeds with the stem cell treatment.  Following the autologous stem cell procedure, I will await recommendations from [facility] to decide on whether maintenance therapy will be necessary.  Clinical evaluation lab work today are permissive to proceed with third cycle of treatment. bortezomib will be administered with those reduction down to 62m/m2 different suspicion for motor  neuropathy contributing to lower extremity weakness.    Plan: --Proceed with Cycle #5 of RVd + zoledronic acid  --Supportive care with Bactrim and acyclovir to reduce the risk of PCP and VZV infections respectively; at the last visit, patient was started on prophylactic dose of Rivaroxaban (Xarelto) due to superficial venous thrombosis and as a prophylaxis for thrombosis due to use of lenalidomide. --Patient will return to our clinic in 3 weeks with lab work to monitor treatment toxicity & possible Cycle #6 of RVd  Voice recognition software was used and creation of this note. Despite my best effort at editing the text, some misspelling/errors may have occurred.  Orders Placed This Encounter  Procedures  . CBC with Differential    Standing Status:   Future    Standing Expiration Date:   05/04/2018  . Comprehensive metabolic panel    Standing Status:   Future    Standing Expiration Date:   05/04/2018  . Beta 2 microglobulin    Standing Status:   Future    Standing Expiration Date:   05/04/2018  . Kappa/lambda light chains    Standing Status:   Future    Standing Expiration Date:   05/04/2018  . Multiple Myeloma Panel (SPEP&IFE w/QIG)    Standing Status:   Future    Standing Expiration Date:   05/04/2018  . 24-Hr Ur UPEP/UIFE/Light Chains/TP    Standing Status:   Future    Standing Expiration Date:   05/04/2018    Cancer Staging Multiple myeloma not having achieved remission ([place] Staging form: Plasma Cell Myeloma and Plasma Cell Disorders, AJCC 8th Edition - Clinical stage from 01/24/2017: RISS Stage II (Beta-2-microglobulin (mg/L): 2.3, Albumin (g/dL): 2.9, ISS: Stage II, High-risk cytogenetics: Absent,  LDH: Normal) - Signed by [name], MD on 02/21/2017   All questions were answered.  . The patient knows to call the clinic with any problems, questions or concerns.  This note was electronically signed.    History of Presenting Illness Christine Garrett is a  60 y.o.  female followed in the [place] for diagnosis of active IgG kappa multiple myeloma. Please see oncologic history below for details.  Patient initially presented with epigastric abdominal pain that started in Apr 2018. Patient was previously self medicating with Aleve and Naprosyn or chronic pains. She did not have any melena or hematochezia. Due to the intensity of the pain, patient underwent EGD on 11/29/16 that demonstrates presence of H. pylori-associated gastritis. At the time of presentation, family reported weight loss of 60 pounds over 3-4 month period. Previously, patient has been habitually drinking alcohol up to 6 beverages per day, quit drinking mid-May 2018. Continued to have abdominal pain and underwent imaging assessment with CT of the abdomen and pelvis on 12/16/16 did demonstrate presence of a mass lesion with spinal cord compression at level of T8. Compression was relieved surgically. She has been recovering well from surgery.  Patient presents to the clinic today for the first day of the 5th cycle of systemic induction chemotherapy as well as for review of restaging bone marrow biopsy obtained prior to the visit.  In the interim, patient reports stable paresthesias in the upper extremities without impairment with activities of daily living.  Patient denies any interval fever, chills, night sweats.  Denies any respiratory symptoms, nausea, or vomiting.  No diarrhea or constipation.  No urinary symptoms.  Oncological/hematological History: --Labs, 11/22/16: Ca 9.5, Cr 0.86; WBC 5.6, Hgb 12.2, Plt 270;  --EGD, 11/29/16: Gastritis positive for H. pylori. Treated with omeprazole, clarithromycin, and amoxicillin x14d  --CT A/P, 12/16/16: Left T8 lytic lesion with soft tissue extension into the spinal canal with cord compression. Fatty liver measuring 19.6cm. 0.3cm lt lower pole renal calculus, no hydronephrosis;  --MRI T-spine, 12/17/16: An enhancing mass on the left side at T8 invading  the canal and compressing the spinal cord.   Oncological/hematological History:   Multiple myeloma not having achieved remission ([place])   12/26/2016 Initial Diagnosis    Multiple myeloma not having achieved remission [facility]): Originally presented with a mass resulting in compression of the spinal cord. Underwent surgery on 12/26/16 for decompression. --Initial Surgical Pathology: Plasma cell neoplasm involvement, IHC -- positive for CD138, CD79a, CD56, LCA, CD43, kappa light chains & negative for CD20, lambda light chains; --Baseline labs, 12/30/16: tProt 7.4, Alb 2.9, Ca 9.1, Cr 0.96, AP 77; SPEP/SIFE -- M-Spike 1.0g/dL, monoclonal IgG kappa present; IgA 51, IgG 1477, IgM 17; kappa 77.6, lambda 7.4, KLR 10.35; LDH 161, beta-2 microglobulin 2.3; WBC 10.8, Hgb 11.2, Plt 298;      01/16/2017 Imaging    PET-CT: Positive pathological uptake in T9 and proximal right tibia. No other hypermetabolic disease      06/22/3844 Pathology Results    Bone Marrow Biopsy: Hypercellular bone marrow with 16% involvement by plasma cell process FISH: positive for [id number] loss, 13q34 loss, negative for del p53      01/30/2017 -  Chemotherapy    RVd: Lenalidomide 13m PO QDay, d1-14 + bortezomib 1.365mm2 d1,8,15 + low-dose dexamethasone 4059mO QWk Q21d --Cycle #1, 01/30/17: Complete located by grade 1 nausea --Cycle #2, 02/16/76/93omplicated by grade 2 nausea, grade 1 fatigue --Cycle #3, 09/90/30/09omplicated by progressive LE weakness --Cycle #4, 04/03/17:  Bortezomib dose reduced to 5m/kg --Cycle #5, 04/24/17: Bortezomib dose reduced to 140mkg       04/15/2017 Imaging    PET-CT: No residual hypermetabolic activity in the skeletal structures.      04/21/2017 Pathology Results    BM Bx: --Mildly hypercellular bone marrow with 2% plasma cells with polyclonal appearance. --Flow Cyto: No evidence of monoclonal plasma cell or lymphocyte populations.       Medical History: Past Medical History:  Diagnosis  Date  . Arthritis    knees  . Cancer ([facility]  . Carpal tunnel syndrome   . CHF (congestive heart failure) ([place]  . Diabetes mellitus    type 2  . GERD (gastroesophageal reflux disease)   . Heart murmur    "Little, No concerns" per Dr  DeDannielle Burnt reported.  . Hypertension    controlled using a guided approch with plasma renin activity  . Neuropathy   . Peripheral vascular disease ([place]  . Sleep apnea    Uses CPAP    Surgical History: Past Surgical History:  Procedure Laterality Date  . BIOPSY  11/28/2016   [facility] [place]. CERVICAL CONE BIOPSY     cervical lesion  . ESOPHAGOGASTRODUODENOSCOPY (EGD) WITH PROPOFOL N/A 11/28/2016   [facility]. IR FLUORO GUIDE PORT INSERTION RIGHT  01/29/2017  . IR [place] VASC ACCESS RIGHT  01/29/2017  . lipoma removal     right shoulder 2001  . MULTIPLE EXTRACTION WITH ALVEOLOPLASTY BIOPSY OF RIGHT AND LEFT MANDIBLE Bilateral 08/24/2012   [facility]. ROTATOR CUFF REPAIR     right shoulder  . THORACIC EIGHT TUMOR RESECTION, THORACIC SIX- THORACIC TEN POSTERIOR SPINAL FUSION N/A 12/26/2016   [facility] by [facility]ok PallMD [facility]. TUBAL LIGATION      Family History: Family History  Problem Relation Age of Onset  . Diabetes Mother   . Hypertension Mother   . Heart disease Mother   . Diabetes Father   . Heart disease Sister   . Diabetes Sister     Social History: Social History   Socioeconomic History  . Marital status: Single    Spouse name: Not on file  . Number of children: Not on file  . Years of education: Not on file  . Highest education level: Not on file  Social Needs  . Financial resource strain: Not on file  . Food insecurity - worry: Not on file  . Food insecurity - inability: Not on file  . Transportation needs - medical: Not on file  . Transportation needs - non-medical: Not on file   Occupational History  . Not on file  Tobacco Use  . Smoking status: Former Smoker    Packs/day: 0.50    Years: 6.00    Pack years: 3.00    Types: Cigarettes    Last attempt to quit: 11/23/2011    Years since quitting: 5.4  . Smokeless tobacco: Never Used  . Tobacco comment: quit summer 2013  Substance and Sexual Activity  . Alcohol use: No    Alcohol/week: 3.6 oz    Types: 6 Cans of beer per week  . Drug use: No  . Sexual activity: Not Currently    Birth control/protection: Post-menopausal  Other Topics Concern  . Not on  file  Social History Narrative  . Not on file    Allergies: Allergies  Allergen Reactions  . No Known Allergies     Medications:  Current Outpatient Medications  Medication Sig Dispense Refill  . acyclovir (ZOVIRAX) 400 MG tablet Take 1 tablet (400 mg total) by mouth 2 (two) times daily. 60 tablet 3  . chlorthalidone (HYGROTON) 25 MG tablet Take 25 mg by mouth 2 (two) times daily.   11  . dexamethasone (DECADRON) 4 MG tablet Take 10 tablets (40 mg) on days 1, 8, and 15 of chemo. Repeat every 21 days. 30 tablet 3  . diltiazem (CARDIZEM CD) 300 MG 24 hr capsule Take 325ms by mouth daily  3  . enalapril (VASOTEC) 20 MG tablet Take 20 mg by mouth 2 (two) times daily.     . insulin degludec (TRESIBA FLEXTOUCH) 100 UNIT/ML SOPN FlexTouch Pen Inject 0.5 mLs (50 Units total) into the skin daily at 10 pm. 5 pen 2  . Insulin Pen Needle (B-D ULTRAFINE III SHORT PEN) 31G X 8 MM MISC 1 each by Does not apply route as directed. 100 each 3  . KLOR-CON M20 20 MEQ tablet TAKE 1 TABLET BY MOUTH EVERY DAY (Patient not taking: Reported on 05/01/2017) 30 tablet 2  . lenalidomide (REVLIMID) 25 MG capsule Take one capsule (295m by mouth once daily on days 1-14 every 21 days. Auth# [id number]/11/2016 14 capsule 0  . lidocaine-prilocaine (EMLA) cream Apply to affected area once 30 g 3  . LORazepam (ATIVAN) 0.5 MG tablet Take 1 tablet (0.5 mg total) by mouth every 6 (six) hours  as needed (Nausea or vomiting). 30 tablet 0  . metFORMIN (GLUCOPHAGE) 1000 MG tablet Take 1,000 mg by mouth 2 (two) times daily.     . nicotine (NICODERM CQ - DOSED IN MG/24 HOURS) 14 mg/24hr patch Place 1 patch (14 mg total) onto the skin daily. (Patient not taking: Reported on 05/01/2017) 28 patch 0  . nicotine polacrilex (COMMIT) 2 MG lozenge Take 1 lozenge (2 mg total) by mouth as needed for smoking cessation. (Patient not taking: Reported on 05/01/2017) 100 tablet 0  . ondansetron (ZOFRAN) 8 MG tablet Take 1 tablet (8 mg total) by mouth 2 (two) times daily as needed (Nausea or vomiting). 30 tablet 1  . oxyCODONE (OXY IR/ROXICODONE) 5 MG immediate release tablet Take 1 tablet (5 mg total) by mouth every 4 (four) hours as needed for severe pain. 30 tablet 0  . prochlorperazine (COMPAZINE) 10 MG tablet Take 1 tablet (10 mg total) by mouth every 6 (six) hours as needed (Nausea or vomiting). 30 tablet 1  . rivaroxaban (XARELTO) 10 MG TABS tablet Take 1 tablet (10 mg total) by mouth daily. 30 tablet 3  . tiZANidine (ZANAFLEX) 4 MG tablet Take 1 tablet (4 mg total) by mouth every 6 (six) hours as needed for muscle spasms. 60 tablet 0  . gabapentin (NEURONTIN) 300 MG capsule Take 1 capsule (300 mg total) by mouth 3 (three) times daily. 63 capsule 3  . metoCLOPramide (REGLAN) 10 MG tablet Take 1 tablet (10 mg total) every 6 (six) hours by mouth. 30 tablet 0   No current facility-administered medications for this visit.     Review of Systems: Review of Systems  Gastrointestinal: Negative for nausea.  Musculoskeletal: Positive for arthralgias, back pain, gait problem and myalgias.  Neurological: Positive for extremity weakness, gait problem and numbness.  All other systems reviewed and are negative.    PHYSICAL EXAMINATION  Blood pressure 127/66, pulse 71, temperature 98.5 F (36.9 C), temperature source Oral, resp. rate 20, height 5' 9" (1.753 m), weight 206 lb 1.6 oz (93.5 kg), SpO2 100  %.  ECOG PERFORMANCE STATUS: 2 - Symptomatic, <50% confined to bed  Physical Exam  Constitutional: She is oriented to person, place, and time and well-developed, well-nourished, and in no distress.  HENT:  Head: Normocephalic and atraumatic.  Mouth/Throat: Oropharynx is clear and moist. No oropharyngeal exudate.  Eyes: EOM are normal. Pupils are equal, round, and reactive to light. No scleral icterus.  Neck: No thyromegaly present.  Cardiovascular: Normal rate, regular rhythm and normal heart sounds. Exam reveals no gallop.  No murmur heard. Pulmonary/Chest: Effort normal and breath sounds normal. She has no wheezes.  Abdominal: Soft. Bowel sounds are normal. She exhibits no distension and no mass. There is no tenderness. There is no rebound.  Musculoskeletal: Normal range of motion. She exhibits no edema.       Right shoulder: She exhibits no tenderness, no bony tenderness and no swelling.  Neurological: She is alert and oriented to person, place, and time.  Patient ambulates with difficulty and uses a walker.     LABORATORY DATA: I have personally reviewed the data as listed: Appointment on 04/24/2017  Component Date Value Ref Range Status  . IgG, Qn, Serum 04/24/2017 675* 700 - 1,600 mg/dL Final  . IgA, Qn, Serum 04/24/2017 145  87 - 352 mg/dL Final  . IgM, Qn, Serum 04/24/2017 40  26 - 217 mg/dL Final  . Total Protein 04/24/2017 6.1  6.0 - 8.5 g/dL Final  . Albumin SerPl Elph-Mcnc 04/24/2017 3.4  2.9 - 4.4 g/dL Final  . Alpha 1 04/24/2017 0.2  0.0 - 0.4 g/dL Final  . Alpha2 Glob SerPl Elph-Mcnc 04/24/2017 0.9  0.4 - 1.0 g/dL Final  . B-Globulin SerPl Elph-Mcnc 04/24/2017 0.8  0.7 - 1.3 g/dL Final  . Gamma Glob SerPl Elph-Mcnc 04/24/2017 0.7  0.4 - 1.8 g/dL Final  . M Protein SerPl Elph-Mcnc 04/24/2017 0.3* Not Observed g/dL Final  . Globulin, Total 04/24/2017 2.7  2.2 - 3.9 g/dL Final  . Albumin/Glob SerPl 04/24/2017 1.3  0.7 - 1.7 Final  . IFE 1 04/24/2017 Comment   Final    Comment: Immunofixation shows IgG monoclonal protein with kappa light chain specificity.   . Please Note 04/24/2017 Comment   Final   Comment: Protein electrophoresis scan will follow via computer, mail, or courier delivery.   Loletha Grayer Kappa Free Light Chain 04/24/2017 15.5  3.3 - 19.4 mg/L Final  . Ig Lambda Free Light Chain 04/24/2017 14.2  5.7 - 26.3 mg/L Final  . Kappa/Lambda FluidC Ratio 04/24/2017 1.09  0.26 - 1.65 Final  . Phosphorus, Ser 04/24/2017 3.0  2.5 - 4.5 mg/dL Final  . Magnesium 04/24/2017 2.0  1.5 - 2.5 mg/dl Final  . Sodium 04/24/2017 138  136 - 145 mEq/L Final  . Potassium 04/24/2017 3.3* 3.5 - 5.1 mEq/L Final  . Chloride 04/24/2017 104  98 - 109 mEq/L Final  . CO2 04/24/2017 23  22 - 29 mEq/L Final  . Glucose 04/24/2017 110  70 - 140 mg/dl Final   Glucose reference range is for nonfasting patients. Fasting glucose reference range is 70- 100.  Marland Kitchen BUN 04/24/2017 11.6  7.0 - 26.0 mg/dL Final  . Creatinine 04/24/2017 1.0  0.6 - 1.1 mg/dL Final  . Total Bilirubin 04/24/2017 0.56  0.20 - 1.20 mg/dL Final  . Alkaline Phosphatase 04/24/2017 64  40 - 150 U/L Final  . AST 04/24/2017 9  5 - 34 U/L Final  . ALT 04/24/2017 10  0 - 55 U/L Final  . Total Protein 04/24/2017 6.7  6.4 - 8.3 g/dL Final  . Albumin 04/24/2017 3.6  3.5 - 5.0 g/dL Final  . Calcium 04/24/2017 9.0  8.4 - 10.4 mg/dL Final  . Anion Gap 04/24/2017 11  3 - 11 mEq/L Final  . EGFR 04/24/2017 >60  >60 ml/min/1.73 m2 Final   eGFR is calculated using the CKD-EPI Creatinine Equation (2009)  . WBC 04/24/2017 5.1  3.9 - 10.3 10e3/uL Final  . NEUT# 04/24/2017 2.0  1.5 - 6.5 10e3/uL Final  . HGB 04/24/2017 10.8* 11.6 - 15.9 g/dL Final  . HCT 04/24/2017 32.3* 34.8 - 46.6 % Final  . Platelets 04/24/2017 255  145 - 400 10e3/uL Final  . MCV 04/24/2017 91.5  79.5 - 101.0 fL Final  . MCH 04/24/2017 30.6  25.1 - 34.0 pg Final  . MCHC 04/24/2017 33.4  31.5 - 36.0 g/dL Final  . RBC 04/24/2017 3.53* 3.70 - 5.45 10e6/uL Final   . RDW 04/24/2017 14.5  11.2 - 14.5 % Final  . lymph# 04/24/2017 2.2  0.9 - 3.3 10e3/uL Final  . MONO# 04/24/2017 0.4  0.1 - 0.9 10e3/uL Final  . Eosinophils Absolute 04/24/2017 0.4  0.0 - 0.5 10e3/uL Final  . Basophils Absolute 04/24/2017 0.1  0.0 - 0.1 10e3/uL Final  . NEUT% 04/24/2017 39.9  38.4 - 76.8 % Final  . LYMPH% 04/24/2017 42.5  14.0 - 49.7 % Final  . MONO% 04/24/2017 8.3  0.0 - 14.0 % Final  . EOS% 04/24/2017 7.5* 0.0 - 7.0 % Final  . BASO% 04/24/2017 1.8  0.0 - 2.0 % Final  Hospital Outpatient Visit on 04/21/2017  Component Date Value Ref Range Status  . Glucose-Capillary 04/21/2017 181* 65 - 99 mg/dL Final  Hospital Outpatient Visit on 04/21/2017  Component Date Value Ref Range Status  . aPTT 04/21/2017 28  24 - 36 seconds Final  . WBC 04/21/2017 4.8  4.0 - 10.5 K/uL Final  . RBC 04/21/2017 3.55* 3.87 - 5.11 MIL/uL Final  . Hemoglobin 04/21/2017 11.0* 12.0 - 15.0 g/dL Final  . HCT 04/21/2017 31.7* 36.0 - 46.0 % Final  . MCV 04/21/2017 89.3  78.0 - 100.0 fL Final  . MCH 04/21/2017 31.0  26.0 - 34.0 pg Final  . MCHC 04/21/2017 34.7  30.0 - 36.0 g/dL Final  . RDW 04/21/2017 14.3  11.5 - 15.5 % Final  . Platelets 04/21/2017 264  150 - 400 K/uL Final  . Prothrombin Time 04/21/2017 14.3  11.4 - 15.2 seconds Final  . INR 04/21/2017 1.12   Final       [name], MD

## 2017-05-06 ENCOUNTER — Encounter (HOSPITAL_COMMUNITY): Payer: Self-pay

## 2017-05-06 LAB — CHROMOSOME ANALYSIS, BONE MARROW

## 2017-05-07 DIAGNOSIS — E119 Type 2 diabetes mellitus without complications: Secondary | ICD-10-CM | POA: Diagnosis not present

## 2017-05-07 DIAGNOSIS — I509 Heart failure, unspecified: Secondary | ICD-10-CM | POA: Diagnosis not present

## 2017-05-07 DIAGNOSIS — Z86718 Personal history of other venous thrombosis and embolism: Secondary | ICD-10-CM | POA: Diagnosis not present

## 2017-05-07 DIAGNOSIS — I110 Hypertensive heart disease with heart failure: Secondary | ICD-10-CM | POA: Diagnosis not present

## 2017-05-07 DIAGNOSIS — Z79899 Other long term (current) drug therapy: Secondary | ICD-10-CM | POA: Diagnosis not present

## 2017-05-07 DIAGNOSIS — E559 Vitamin D deficiency, unspecified: Secondary | ICD-10-CM | POA: Diagnosis not present

## 2017-05-07 DIAGNOSIS — Z885 Allergy status to narcotic agent status: Secondary | ICD-10-CM | POA: Diagnosis not present

## 2017-05-07 DIAGNOSIS — C9000 Multiple myeloma not having achieved remission: Secondary | ICD-10-CM | POA: Diagnosis not present

## 2017-05-07 DIAGNOSIS — Z794 Long term (current) use of insulin: Secondary | ICD-10-CM | POA: Diagnosis not present

## 2017-05-07 DIAGNOSIS — E611 Iron deficiency: Secondary | ICD-10-CM | POA: Diagnosis not present

## 2017-05-07 DIAGNOSIS — E7800 Pure hypercholesterolemia, unspecified: Secondary | ICD-10-CM | POA: Diagnosis not present

## 2017-05-09 ENCOUNTER — Other Ambulatory Visit: Payer: Medicare Other

## 2017-05-09 ENCOUNTER — Ambulatory Visit: Payer: Medicare Other

## 2017-05-15 ENCOUNTER — Ambulatory Visit: Payer: Medicare Other

## 2017-05-15 ENCOUNTER — Telehealth: Payer: Self-pay

## 2017-05-15 ENCOUNTER — Other Ambulatory Visit (HOSPITAL_BASED_OUTPATIENT_CLINIC_OR_DEPARTMENT_OTHER): Payer: Medicare Other

## 2017-05-15 ENCOUNTER — Ambulatory Visit (HOSPITAL_BASED_OUTPATIENT_CLINIC_OR_DEPARTMENT_OTHER): Payer: Medicare Other | Admitting: Hematology and Oncology

## 2017-05-15 ENCOUNTER — Other Ambulatory Visit: Payer: Self-pay

## 2017-05-15 ENCOUNTER — Encounter: Payer: Self-pay | Admitting: Hematology and Oncology

## 2017-05-15 VITALS — BP 116/63 | HR 74 | Temp 98.4°F | Resp 18 | Ht 69.0 in | Wt 195.9 lb

## 2017-05-15 DIAGNOSIS — Z5112 Encounter for antineoplastic immunotherapy: Secondary | ICD-10-CM

## 2017-05-15 DIAGNOSIS — C9000 Multiple myeloma not having achieved remission: Secondary | ICD-10-CM

## 2017-05-15 DIAGNOSIS — R110 Nausea: Secondary | ICD-10-CM | POA: Diagnosis not present

## 2017-05-15 DIAGNOSIS — R197 Diarrhea, unspecified: Secondary | ICD-10-CM

## 2017-05-15 DIAGNOSIS — C9001 Multiple myeloma in remission: Secondary | ICD-10-CM | POA: Diagnosis not present

## 2017-05-15 DIAGNOSIS — R5383 Other fatigue: Secondary | ICD-10-CM

## 2017-05-15 DIAGNOSIS — R630 Anorexia: Secondary | ICD-10-CM | POA: Diagnosis not present

## 2017-05-15 DIAGNOSIS — Z5111 Encounter for antineoplastic chemotherapy: Secondary | ICD-10-CM

## 2017-05-15 LAB — COMPREHENSIVE METABOLIC PANEL
ALT: 10 U/L (ref 0–55)
ANION GAP: 11 meq/L (ref 3–11)
AST: 11 U/L (ref 5–34)
Albumin: 3.6 g/dL (ref 3.5–5.0)
Alkaline Phosphatase: 73 U/L (ref 40–150)
BUN: 12.2 mg/dL (ref 7.0–26.0)
CHLORIDE: 104 meq/L (ref 98–109)
CO2: 24 meq/L (ref 22–29)
Calcium: 9.3 mg/dL (ref 8.4–10.4)
Creatinine: 1 mg/dL (ref 0.6–1.1)
Glucose: 107 mg/dl (ref 70–140)
Potassium: 3.3 mEq/L — ABNORMAL LOW (ref 3.5–5.1)
Sodium: 139 mEq/L (ref 136–145)
Total Bilirubin: 0.42 mg/dL (ref 0.20–1.20)
Total Protein: 6.8 g/dL (ref 6.4–8.3)

## 2017-05-15 LAB — CBC WITH DIFFERENTIAL/PLATELET
BASO%: 2.9 % — ABNORMAL HIGH (ref 0.0–2.0)
BASOS ABS: 0.1 10*3/uL (ref 0.0–0.1)
EOS ABS: 0.5 10*3/uL (ref 0.0–0.5)
EOS%: 10.8 % — ABNORMAL HIGH (ref 0.0–7.0)
HEMATOCRIT: 30.1 % — AB (ref 34.8–46.6)
HEMOGLOBIN: 10 g/dL — AB (ref 11.6–15.9)
LYMPH#: 1.2 10*3/uL (ref 0.9–3.3)
LYMPH%: 26.6 % (ref 14.0–49.7)
MCH: 30.6 pg (ref 25.1–34.0)
MCHC: 33.3 g/dL (ref 31.5–36.0)
MCV: 91.8 fL (ref 79.5–101.0)
MONO#: 0.3 10*3/uL (ref 0.1–0.9)
MONO%: 7.3 % (ref 0.0–14.0)
NEUT#: 2.4 10*3/uL (ref 1.5–6.5)
NEUT%: 52.4 % (ref 38.4–76.8)
Platelets: 300 10*3/uL (ref 145–400)
RBC: 3.28 10*6/uL — ABNORMAL LOW (ref 3.70–5.45)
RDW: 15.2 % — AB (ref 11.2–14.5)
WBC: 4.5 10*3/uL (ref 3.9–10.3)

## 2017-05-15 MED ORDER — SODIUM CHLORIDE 0.9% FLUSH
10.0000 mL | INTRAVENOUS | Status: DC | PRN
  Administered 2017-05-15: 10 mL via INTRAVENOUS
  Filled 2017-05-15: qty 10

## 2017-05-15 MED ORDER — HEPARIN SOD (PORK) LOCK FLUSH 100 UNIT/ML IV SOLN
500.0000 [IU] | Freq: Once | INTRAVENOUS | Status: AC | PRN
  Administered 2017-05-15: 500 [IU] via INTRAVENOUS
  Filled 2017-05-15: qty 5

## 2017-05-15 MED ORDER — SODIUM CHLORIDE 0.9% FLUSH
10.0000 mL | INTRAVENOUS | Status: DC | PRN
Start: 2017-05-15 — End: 2017-06-20
  Administered 2017-05-15: 10 mL via INTRAVENOUS
  Filled 2017-05-15: qty 10

## 2017-05-15 MED ORDER — OMEPRAZOLE 20 MG PO CPDR: 20.0000 mg | DELAYED_RELEASE_CAPSULE | Freq: Two times a day (BID) | ORAL | 0 refills | Status: DC

## 2017-05-15 NOTE — Telephone Encounter (Signed)
Printed avs and calender for upcoming appointment. Per 11/29 los 

## 2017-05-16 ENCOUNTER — Telehealth: Payer: Self-pay

## 2017-05-16 ENCOUNTER — Other Ambulatory Visit: Payer: Self-pay

## 2017-05-16 ENCOUNTER — Encounter (INDEPENDENT_AMBULATORY_CARE_PROVIDER_SITE_OTHER): Payer: Self-pay | Admitting: Internal Medicine

## 2017-05-16 DIAGNOSIS — C9000 Multiple myeloma not having achieved remission: Secondary | ICD-10-CM

## 2017-05-16 LAB — KAPPA/LAMBDA LIGHT CHAINS
IG KAPPA FREE LIGHT CHAIN: 25.9 mg/L — AB (ref 3.3–19.4)
Ig Lambda Free Light Chain: 20.2 mg/L (ref 5.7–26.3)
KAPPA/LAMBDA FLC RATIO: 1.28 (ref 0.26–1.65)

## 2017-05-16 LAB — BETA 2 MICROGLOBULIN, SERUM: Beta-2: 2.2 mg/L (ref 0.6–2.4)

## 2017-05-16 MED ORDER — LENALIDOMIDE 25 MG PO CAPS: ORAL_CAPSULE | ORAL | 0 refills | Status: DC

## 2017-05-16 NOTE — Telephone Encounter (Signed)
Patient was given an appointment for 06/02/17 at 11:30am with [name], NP.  A letter was mailed to the patient.

## 2017-05-16 NOTE — Telephone Encounter (Signed)
Revlimid refill e-scribed to [facility].

## 2017-05-19 LAB — MULTIPLE MYELOMA PANEL, SERUM
ALBUMIN SERPL ELPH-MCNC: 3.4 g/dL (ref 2.9–4.4)
ALBUMIN/GLOB SERPL: 1.2 (ref 0.7–1.7)
ALPHA2 GLOB SERPL ELPH-MCNC: 0.9 g/dL (ref 0.4–1.0)
Alpha 1: 0.3 g/dL (ref 0.0–0.4)
B-Globulin SerPl Elph-Mcnc: 1 g/dL (ref 0.7–1.3)
GAMMA GLOB SERPL ELPH-MCNC: 0.8 g/dL (ref 0.4–1.8)
GLOBULIN, TOTAL: 2.9 g/dL (ref 2.2–3.9)
IGG (IMMUNOGLOBIN G), SERUM: 699 mg/dL — AB (ref 700–1600)
IGM (IMMUNOGLOBIN M), SRM: 48 mg/dL (ref 26–217)
IgA, Qn, Serum: 184 mg/dL (ref 87–352)
M Protein SerPl Elph-Mcnc: 0.2 g/dL — ABNORMAL HIGH
Total Protein: 6.3 g/dL (ref 6.0–8.5)

## 2017-05-20 ENCOUNTER — Other Ambulatory Visit (HOSPITAL_BASED_OUTPATIENT_CLINIC_OR_DEPARTMENT_OTHER): Payer: Medicare Other

## 2017-05-20 ENCOUNTER — Other Ambulatory Visit: Payer: Self-pay

## 2017-05-20 DIAGNOSIS — C9000 Multiple myeloma not having achieved remission: Secondary | ICD-10-CM

## 2017-05-20 DIAGNOSIS — Z52011 Autologous donor, stem cells: Secondary | ICD-10-CM | POA: Insufficient documentation

## 2017-05-20 LAB — CBC WITH DIFFERENTIAL/PLATELET
BASO%: 2.3 % — ABNORMAL HIGH (ref 0.0–2.0)
BASOS ABS: 0.1 10*3/uL (ref 0.0–0.1)
EOS%: 6.9 % (ref 0.0–7.0)
Eosinophils Absolute: 0.3 10*3/uL (ref 0.0–0.5)
HCT: 31.8 % — ABNORMAL LOW (ref 34.8–46.6)
HGB: 10.6 g/dL — ABNORMAL LOW (ref 11.6–15.9)
LYMPH%: 39.3 % (ref 14.0–49.7)
MCH: 30.2 pg (ref 25.1–34.0)
MCHC: 33.3 g/dL (ref 31.5–36.0)
MCV: 90.6 fL (ref 79.5–101.0)
MONO#: 0.3 10*3/uL (ref 0.1–0.9)
MONO%: 6.4 % (ref 0.0–14.0)
NEUT#: 2 10*3/uL (ref 1.5–6.5)
NEUT%: 45.1 % (ref 38.4–76.8)
Platelets: 253 10*3/uL (ref 145–400)
RBC: 3.51 10*6/uL — ABNORMAL LOW (ref 3.70–5.45)
RDW: 13.8 % (ref 11.2–14.5)
WBC: 4.4 10*3/uL (ref 3.9–10.3)
lymph#: 1.7 10*3/uL (ref 0.9–3.3)

## 2017-05-20 LAB — MAGNESIUM: MAGNESIUM: 1.8 mg/dL (ref 1.5–2.5)

## 2017-05-20 LAB — COMPREHENSIVE METABOLIC PANEL
ALBUMIN: 3.8 g/dL (ref 3.5–5.0)
ALT: 8 U/L (ref 0–55)
ANION GAP: 11 meq/L (ref 3–11)
AST: 9 U/L (ref 5–34)
Alkaline Phosphatase: 71 U/L (ref 40–150)
BILIRUBIN TOTAL: 0.35 mg/dL (ref 0.20–1.20)
BUN: 16.1 mg/dL (ref 7.0–26.0)
CO2: 25 meq/L (ref 22–29)
CREATININE: 1.3 mg/dL — AB (ref 0.6–1.1)
Calcium: 9.7 mg/dL (ref 8.4–10.4)
Chloride: 103 mEq/L (ref 98–109)
EGFR: 54 mL/min/{1.73_m2} — ABNORMAL LOW (ref >60)
Glucose: 106 mg/dl (ref 70–140)
Potassium: 3.1 mEq/L — ABNORMAL LOW (ref 3.5–5.1)
Sodium: 140 mEq/L (ref 136–145)
TOTAL PROTEIN: 7.2 g/dL (ref 6.4–8.3)

## 2017-05-20 MED ORDER — DEXAMETHASONE 4 MG PO TABS: ORAL_TABLET | ORAL | 3 refills | Status: DC

## 2017-05-21 LAB — UPEP/UIFE/LIGHT CHAINS/TP, 24-HR UR
% BETA, Urine: 0 %
ALBUMIN, U: 100 %
ALPHA 1 URINE: 0 %
ALPHA-2-GLOBULIN, U: 0 %
FREE KAPPA LT CHAINS, UR: 38.5 mg/L — AB (ref 1.35–24.19)
FREE LAMBDA LT CHAINS, UR: 2.63 mg/L (ref 0.24–6.66)
GAMMA GLOBULIN URINE: 0 %
KAPPA/LAMBDA RATIO, U: 14.64 — AB (ref 2.04–10.37)
PROTEIN UR: 9.9 mg/dL
Prot,24hr calculated: 119 mg/24 hr (ref 30–150)

## 2017-05-21 LAB — PHOSPHORUS: PHOSPHORUS: 4 mg/dL (ref 2.5–4.5)

## 2017-05-22 ENCOUNTER — Telehealth: Payer: Self-pay | Admitting: Hematology and Oncology

## 2017-05-22 ENCOUNTER — Encounter: Payer: Self-pay | Admitting: Hematology and Oncology

## 2017-05-22 ENCOUNTER — Ambulatory Visit (HOSPITAL_BASED_OUTPATIENT_CLINIC_OR_DEPARTMENT_OTHER): Payer: Medicare Other | Admitting: Hematology and Oncology

## 2017-05-22 ENCOUNTER — Ambulatory Visit (HOSPITAL_BASED_OUTPATIENT_CLINIC_OR_DEPARTMENT_OTHER): Payer: Medicare Other

## 2017-05-22 VITALS — BP 124/66 | HR 74 | Temp 98.4°F | Resp 17 | Ht 69.0 in | Wt 197.0 lb

## 2017-05-22 DIAGNOSIS — C9001 Multiple myeloma in remission: Secondary | ICD-10-CM

## 2017-05-22 DIAGNOSIS — Z5112 Encounter for antineoplastic immunotherapy: Secondary | ICD-10-CM

## 2017-05-22 DIAGNOSIS — C9000 Multiple myeloma not having achieved remission: Secondary | ICD-10-CM

## 2017-05-22 MED ORDER — PROCHLORPERAZINE MALEATE 10 MG PO TABS
ORAL_TABLET | ORAL | Status: AC
  Filled 2017-05-22: qty 1

## 2017-05-22 MED ORDER — BORTEZOMIB CHEMO SQ INJECTION 3.5 MG (2.5MG/ML)
1.0000 mg/m2 | Freq: Once | INTRAMUSCULAR | Status: AC
  Administered 2017-05-22: 2.25 mg via SUBCUTANEOUS
  Filled 2017-05-22: qty 2.25

## 2017-05-22 MED ORDER — PROCHLORPERAZINE MALEATE 10 MG PO TABS
10.0000 mg | ORAL_TABLET | Freq: Once | ORAL | Status: AC
  Administered 2017-05-22: 10 mg via ORAL

## 2017-05-22 NOTE — Telephone Encounter (Signed)
Scheduled appts per 12/6 los - patient will get updated schedule in inf.

## 2017-05-22 NOTE — Patient Instructions (Signed)
[facility] Discharge Instructions for Patients Receiving Chemotherapy  Today you received the following chemotherapy agents Velcade.  To help prevent nausea and vomiting after your treatment, we encourage you to take your nausea medication as directed.  If you develop nausea and vomiting that is not controlled by your nausea medication, call the clinic.   BELOW ARE SYMPTOMS THAT SHOULD BE REPORTED IMMEDIATELY:  *FEVER GREATER THAN 100.5 F  *CHILLS WITH OR WITHOUT FEVER  NAUSEA AND VOMITING THAT IS NOT CONTROLLED WITH YOUR NAUSEA MEDICATION  *UNUSUAL SHORTNESS OF BREATH  *UNUSUAL BRUISING OR BLEEDING  TENDERNESS IN MOUTH AND THROAT WITH OR WITHOUT PRESENCE OF ULCERS  *URINARY PROBLEMS  *BOWEL PROBLEMS  UNUSUAL RASH Items with * indicate a potential emergency and should be followed up as soon as possible.  Feel free to call the clinic should you have any questions or concerns. The clinic phone number is [phone].  Please show the CHEMO ALERT CARD at check-in to the Emergency Department and triage nurse.   

## 2017-05-28 ENCOUNTER — Other Ambulatory Visit: Payer: Self-pay

## 2017-05-28 DIAGNOSIS — I8001 Phlebitis and thrombophlebitis of superficial vessels of right lower extremity: Secondary | ICD-10-CM

## 2017-05-28 DIAGNOSIS — C9000 Multiple myeloma not having achieved remission: Secondary | ICD-10-CM

## 2017-05-28 MED ORDER — RIVAROXABAN 10 MG PO TABS: 10.0000 mg | ORAL_TABLET | Freq: Every day | ORAL | 0 refills | Status: DC

## 2017-05-28 MED ORDER — ACYCLOVIR 400 MG PO TABS: 400.0000 mg | ORAL_TABLET | Freq: Two times a day (BID) | ORAL | 0 refills | Status: DC

## 2017-05-29 ENCOUNTER — Telehealth: Payer: Self-pay

## 2017-05-29 NOTE — Telephone Encounter (Signed)
Received a call from Urban Gibson, RN  at [facility]. Stated she faxed a copy of the patient's transplant appointment schedule to [facility] and mailed a copy to the patient as well as spoke with the patient. Fax received. Placed in a folder for Dr. Lebron Conners to review upon his return to the office.

## 2017-05-30 ENCOUNTER — Other Ambulatory Visit: Payer: Medicare Other

## 2017-05-30 ENCOUNTER — Ambulatory Visit: Payer: Medicare Other

## 2017-06-02 ENCOUNTER — Ambulatory Visit (INDEPENDENT_AMBULATORY_CARE_PROVIDER_SITE_OTHER): Payer: Medicare Other | Admitting: Internal Medicine

## 2017-06-02 ENCOUNTER — Encounter (INDEPENDENT_AMBULATORY_CARE_PROVIDER_SITE_OTHER): Payer: Self-pay | Admitting: Internal Medicine

## 2017-06-02 VITALS — BP 144/62 | HR 64 | Temp 98.7°F | Ht 69.5 in | Wt 194.5 lb

## 2017-06-02 DIAGNOSIS — K219 Gastro-esophageal reflux disease without esophagitis: Secondary | ICD-10-CM | POA: Diagnosis not present

## 2017-06-02 NOTE — Patient Instructions (Addendum)
OV in 1 year.  

## 2017-06-02 NOTE — Progress Notes (Signed)
Subjective:    Patient ID: Christine Garrett, female    DOB: 05-Dec-1955, 61 y.o.   MRN: [id number]  HPI Here today for f/u. Last seen in May of this year with epigastric pain. Had been taking Aleve x 2 a day for arthritis. Had been taking NSAIDs x 2 years.  There was no blood in her stools. Underwent an EGD in June of this year which revealed normal esophagus. Erosive gastropathy.  Last wt 212lbs in May of this year. Today her weight is 194.5. Recently started on on Xarelto for DVT. She says her appetite is not good due to her treatment.  No abdominal pain.  Weight loss started after starting her multiple myeloma tx.  Take Omeprazole 42m BID    New diagnosis of multiple myeloma in July of this year. Underwent thoracic eight tumor resecition, thoracic six thoracic ten posterior spinal fusion.  Review of Systems Past Medical History:  Diagnosis Date  . Arthritis    knees  . Cancer ([facility]   . Carpal tunnel syndrome   . CHF (congestive heart failure) ([place]   . Diabetes mellitus    type 2  . GERD (gastroesophageal reflux disease)   . Heart murmur    "Little, No concerns" per Dr  DDannielle Burnpt reported.  . Hypertension    controlled using a guided approch with plasma renin activity  . [place] 01/06[place]2017  . Neuropathy   . Peripheral vascular disease ([facility]   . Sleep apnea    Uses CPAP    Past Surgical History:  Procedure Laterality Date  . BIOPSY  11/28/2016   Procedure: BIOPSY;  Surgeon: RRogene Houston MD;  [facility];  Service: Endoscopy;;  gastric  . [place] . CERVICAL CONE BIOPSY     cervical lesion  . ESOPHAGOGASTRODUODENOSCOPY (EGD) WITH PROPOFOL N/A 11/28/2016   Procedure: ESOPHAGOGASTRODUODENOSCOPY (EGD) WITH PROPOFOL;  Surgeon: RRogene Houston MD;  [facility];  Service: Endoscopy;  Laterality: N/A;  9:25  . IR FLUORO GUIDE PORT INSERTION RIGHT  01/29/2017  . IR [place] ACCESS RIGHT   01/29/2017  . lipoma removal     right shoulder 2001  . MULTIPLE EXTRACTIONS WITH ALVEOLOPLASTY Bilateral 08/24/2012   Procedure: MULTIPLE EXTRACTION WITH ALVEOLOPLASTY BIOPSY OF RIGHT AND LEFT MANDIBLE ;  Surgeon: SGae Bon DDS;  [facility]  Service: Oral Surgery;  Laterality: Bilateral;  . POSTERIOR LUMBAR FUSION 4 LEVEL N/A 12/26/2016   Procedure: THORACIC EIGHT TUMOR RESECTION, THORACIC SIX- THORACIC TEN POSTERIOR SPINAL FUSION;  Surgeon: CAshok Pall MD;  [facility]  Service: Neurosurgery;  Laterality: N/A;  THORACIC 8 TUMOR RESECTION, THORACIC 6- THORACIC 10 POSTERIOR SPINAL FUSION  . ROTATOR CUFF REPAIR     right shoulder  . TUBAL LIGATION      Allergies  Allergen Reactions  . No Known Allergies     Current Outpatient Medications on File Prior to Visit  Medication Sig Dispense Refill  . acyclovir (ZOVIRAX) 400 MG tablet Take 1 tablet (400 mg total) by mouth 2 (two) times daily. 60 tablet 0  . chlorthalidone (HYGROTON) 25 MG tablet Take 25 mg by mouth 2 (two) times daily.   11  . dexamethasone (DECADRON) 4 MG tablet Take 10 tablets (40 mg) on days 1, 8, and 15 of chemo. Repeat every 21 days. 30 tablet 3  . diltiazem (CARDIZEM CD) 300 MG 24 hr capsule Take 3089m  by mouth daily  3  . enalapril (VASOTEC) 20 MG tablet Take 20 mg by mouth 2 (two) times daily.     . insulin degludec (TRESIBA FLEXTOUCH) 100 UNIT/ML SOPN FlexTouch Pen Inject 0.5 mLs (50 Units total) into the skin daily at 10 pm. 5 pen 2  . Insulin Pen Needle (B-D ULTRAFINE III SHORT PEN) 31G X 8 MM MISC 1 each by Does not apply route as directed. 100 each 3  . KLOR-CON M20 20 MEQ tablet TAKE 1 TABLET BY MOUTH EVERY DAY 30 tablet 2  . lenalidomide (REVLIMID) 25 MG capsule Take one capsule ('25mg'$ ) by mouth once daily on days 1-14 every 21 days. Auth# [id number] 05/16/17 14 capsule 0  . lidocaine-prilocaine (EMLA) cream Apply to affected area once 30 g 3  . LORazepam (ATIVAN) 0.5 MG tablet Take 1 tablet (0.5 mg  total) by mouth every 6 (six) hours as needed (Nausea or vomiting). 30 tablet 0  . metFORMIN (GLUCOPHAGE) 1000 MG tablet Take 1,000 mg by mouth 2 (two) times daily.     . metoCLOPramide (REGLAN) 10 MG tablet Take 1 tablet (10 mg total) every 6 (six) hours by mouth. 30 tablet 0  . omeprazole (PRILOSEC) 20 MG capsule Take 1 capsule (20 mg total) by mouth 2 (two) times daily before a meal. 60 capsule 0  . ondansetron (ZOFRAN) 8 MG tablet Take 1 tablet (8 mg total) by mouth 2 (two) times daily as needed (Nausea or vomiting). 30 tablet 1  . prochlorperazine (COMPAZINE) 10 MG tablet Take 1 tablet (10 mg total) by mouth every 6 (six) hours as needed (Nausea or vomiting). 30 tablet 1  . rivaroxaban (XARELTO) 10 MG TABS tablet Take 1 tablet (10 mg total) by mouth daily. 30 tablet 0  . tiZANidine (ZANAFLEX) 4 MG tablet Take 1 tablet (4 mg total) by mouth every 6 (six) hours as needed for muscle spasms. 60 tablet 0  . gabapentin (NEURONTIN) 300 MG capsule Take 1 capsule (300 mg total) by mouth 3 (three) times daily. 63 capsule 3  . nicotine (NICODERM CQ - DOSED IN MG/24 HOURS) 14 mg/24hr patch Place 1 patch (14 mg total) onto the skin daily. (Patient not taking: Reported on 05/01/2017) 28 patch 0  . nicotine polacrilex (COMMIT) 2 MG lozenge Take 1 lozenge (2 mg total) by mouth as needed for smoking cessation. (Patient not taking: Reported on 05/01/2017) 100 tablet 0  . oxyCODONE (OXY IR/ROXICODONE) 5 MG immediate release tablet Take 1 tablet (5 mg total) by mouth every 4 (four) hours as needed for severe pain. (Patient not taking: Reported on 06/02/2017) 30 tablet 0   Current Facility-Administered Medications on File Prior to Visit  Medication Dose Route Frequency Provider Last Rate Last Dose  . sodium chloride flush (NS) 0.9 % injection 10 mL  10 mL Intravenous PRN [name], MD   10 mL at 05/15/17 1408        Objective:   Physical Exam Blood pressure (!) 144/62, pulse 64, temperature 98.7 F  (37.1 C), height 5' 9.5" (1.765 m), weight 194 lb 8 oz (88.2 kg). Alert and oriented. Skin warm and dry. Oral mucosa is moist.   . Sclera anicteric, conjunctivae is pink. Thyroid not enlarged. No cervical lymphadenopathy. Lungs clear. Heart regular rate and rhythm.  Abdomen is soft. Bowel sounds are positive. No hepatomegaly. No abdominal masses felt. No tenderness.  No edema to lower extremities.           Assessment & Plan:  GERD. Continue the Omeprazole. OV in 1 year.

## 2017-06-05 DIAGNOSIS — I4581 Long QT syndrome: Secondary | ICD-10-CM | POA: Diagnosis not present

## 2017-06-05 DIAGNOSIS — C9000 Multiple myeloma not having achieved remission: Secondary | ICD-10-CM | POA: Diagnosis not present

## 2017-06-05 DIAGNOSIS — I491 Atrial premature depolarization: Secondary | ICD-10-CM | POA: Diagnosis not present

## 2017-06-05 DIAGNOSIS — M899 Disorder of bone, unspecified: Secondary | ICD-10-CM | POA: Diagnosis not present

## 2017-06-05 DIAGNOSIS — I083 Combined rheumatic disorders of mitral, aortic and tricuspid valves: Secondary | ICD-10-CM | POA: Diagnosis not present

## 2017-06-05 DIAGNOSIS — Z01810 Encounter for preprocedural cardiovascular examination: Secondary | ICD-10-CM | POA: Diagnosis not present

## 2017-06-05 DIAGNOSIS — Z1159 Encounter for screening for other viral diseases: Secondary | ICD-10-CM | POA: Diagnosis not present

## 2017-06-05 DIAGNOSIS — Z01818 Encounter for other preprocedural examination: Secondary | ICD-10-CM | POA: Diagnosis not present

## 2017-06-05 DIAGNOSIS — Z7682 Awaiting organ transplant status: Secondary | ICD-10-CM | POA: Diagnosis not present

## 2017-06-05 DIAGNOSIS — Z01811 Encounter for preprocedural respiratory examination: Secondary | ICD-10-CM | POA: Diagnosis not present

## 2017-06-06 ENCOUNTER — Telehealth: Payer: Self-pay

## 2017-06-06 NOTE — Telephone Encounter (Signed)
Received a call from [facility] with [facility]. Stated patient had labs drawn and her potassium was 2.8. Per Wells Guiles, nurse practitioner at [facility] is going to increase patient's potassium to 34mEQ three times daily and they instructed patient to drink one glass of Gatorade daily. Per Wells Guiles, they will take over patient's care until completion of Bone Marrow Transplant.

## 2017-06-11 DIAGNOSIS — C9000 Multiple myeloma not having achieved remission: Secondary | ICD-10-CM | POA: Diagnosis not present

## 2017-06-11 DIAGNOSIS — E611 Iron deficiency: Secondary | ICD-10-CM | POA: Diagnosis not present

## 2017-06-11 DIAGNOSIS — E559 Vitamin D deficiency, unspecified: Secondary | ICD-10-CM | POA: Diagnosis not present

## 2017-06-19 ENCOUNTER — Ambulatory Visit: Payer: Medicare Other

## 2017-06-19 ENCOUNTER — Other Ambulatory Visit: Payer: Medicare Other

## 2017-06-19 ENCOUNTER — Ambulatory Visit: Payer: Medicare Other | Admitting: Hematology and Oncology

## 2017-06-19 DIAGNOSIS — M6281 Muscle weakness (generalized): Secondary | ICD-10-CM | POA: Diagnosis not present

## 2017-06-19 DIAGNOSIS — R29898 Other symptoms and signs involving the musculoskeletal system: Secondary | ICD-10-CM | POA: Diagnosis not present

## 2017-06-19 DIAGNOSIS — Z7682 Awaiting organ transplant status: Secondary | ICD-10-CM | POA: Diagnosis not present

## 2017-06-19 DIAGNOSIS — Z9889 Other specified postprocedural states: Secondary | ICD-10-CM | POA: Diagnosis not present

## 2017-06-19 DIAGNOSIS — G629 Polyneuropathy, unspecified: Secondary | ICD-10-CM | POA: Diagnosis not present

## 2017-06-19 DIAGNOSIS — G893 Neoplasm related pain (acute) (chronic): Secondary | ICD-10-CM | POA: Diagnosis not present

## 2017-06-19 DIAGNOSIS — Z01818 Encounter for other preprocedural examination: Secondary | ICD-10-CM | POA: Diagnosis not present

## 2017-06-19 DIAGNOSIS — M792 Neuralgia and neuritis, unspecified: Secondary | ICD-10-CM | POA: Diagnosis not present

## 2017-06-19 DIAGNOSIS — C9000 Multiple myeloma not having achieved remission: Secondary | ICD-10-CM | POA: Diagnosis not present

## 2017-06-20 ENCOUNTER — Other Ambulatory Visit: Payer: Self-pay

## 2017-06-20 DIAGNOSIS — Z5112 Encounter for antineoplastic immunotherapy: Secondary | ICD-10-CM | POA: Diagnosis not present

## 2017-06-20 DIAGNOSIS — Z79899 Other long term (current) drug therapy: Secondary | ICD-10-CM | POA: Diagnosis not present

## 2017-06-20 DIAGNOSIS — C9000 Multiple myeloma not having achieved remission: Secondary | ICD-10-CM | POA: Diagnosis not present

## 2017-06-20 MED ORDER — OMEPRAZOLE 20 MG PO CPDR: 20.0000 mg | DELAYED_RELEASE_CAPSULE | Freq: Two times a day (BID) | ORAL | 0 refills | Status: DC

## 2017-06-20 NOTE — Assessment & Plan Note (Signed)
62 y.o. female who initially presented with significant weight loss in the context of H. pylori-associated gastritis and alcohol abuse. At the same time, an incidental discovery of a lower thoracic vertebral mass in the context of chronic numbness and weakness in the lower extremities. MRI of the thoracic spine demonstrated a destructive lesion at T8 level and patient underwent surgical treatment. 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 Staging evaluation conducted by our clinic confirmed presence of IgG kappa multiple myeloma. Cytogenetics studies demonstrated normal karyotype, FISH studies are positive for 13q34 & [id number] loss and no loss of p53. Based on initial information, patient is now staged at [place] [facility].   After the patient has received 4 cycles of systemic therapy, she underwent reassessment with both PET/CT and a bone marrow biopsy.  Both studies are consistent with complete disease response to the instituted therapy.  Currently, plan is to complete 6 cycles of systemic therapy followed by consolidation with high-dose chemotherapy/autologous stem cell rescue.  Presently, patient is suffering from worsening nausea, diarrhea, decreased appetite and fatigue.  Most consistent with viral gastroenteritis, but cannot exclude development of Clostridium difficile colitis.  Plan: --Delayed initiation with Cycle #6 of RVd + zoledronic acid  --Supportive care with Bactrim and acyclovir to reduce the risk of PCP and VZV infections respectively; at the last visit, patient was started on prophylactic dose of Rivaroxaban (Xarelto) due to superficial venous thrombosis and as a prophylaxis for thrombosis due to use of lenalidomide. --Check stool for C. difficile, provide additional IV fluids to counter volume loss --Patient will return to our clinic in 1 week with lab work to monitor treatment toxicity & possible Cycle #6 of RVd

## 2017-06-20 NOTE — Progress Notes (Signed)
Christine Garrett Follow-up Visit:  Assessment: Multiple myeloma not having achieved remission [facility]) 62 y.o. female who initially presented with significant weight loss in the context of H. pylori-associated gastritis and alcohol abuse. At the same time, an incidental discovery of a lower thoracic vertebral mass in the context of chronic numbness and weakness in the lower extremities. MRI of the thoracic spine demonstrated a destructive lesion at T8 level and patient underwent surgical treatment. 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 Staging evaluation conducted by our clinic confirmed presence of IgG kappa multiple myeloma. Cytogenetics studies demonstrated normal karyotype, FISH studies are positive for 13q34 & [id number] loss and no loss of p53. Based on initial information, patient is now staged at [place] [facility].   After the patient has received 4 cycles of systemic therapy, she underwent reassessment with both PET/CT and a bone marrow biopsy.  Both studies are consistent with complete disease response to the instituted therapy.  Currently, plan is to complete 6 cycles of systemic therapy followed by consolidation with high-dose chemotherapy/autologous stem cell rescue.  Presently, patient is suffering from worsening nausea, diarrhea, decreased appetite and fatigue.  Most consistent with viral gastroenteritis, but cannot exclude development of Clostridium difficile colitis.  Plan: --Delayed initiation with Cycle #6 of RVd + zoledronic acid  --Supportive care with Bactrim and acyclovir to reduce the risk of PCP and VZV infections respectively; at the last visit, patient was started on prophylactic dose of Rivaroxaban (Xarelto) due to superficial venous thrombosis and as a prophylaxis for thrombosis due to use of lenalidomide. --Check stool for C. difficile, provide additional IV fluids to counter volume loss --Patient will return to our clinic in 1 week with  lab work to monitor treatment toxicity & possible Cycle #6 of RVd  Voice recognition software was used and creation of this note. Despite my best effort at editing the text, some misspelling/errors may have occurred.  Orders Placed This Encounter  Procedures  . CBC with Differential    Standing Status:   Future    Number of Occurrences:   1    Standing Expiration Date:   05/15/2018  . Comprehensive metabolic panel    Standing Status:   Future    Number of Occurrences:   1    Standing Expiration Date:   05/15/2018  . Magnesium    Standing Status:   Future    Number of Occurrences:   1    Standing Expiration Date:   05/15/2018  . Phosphorus    Standing Status:   Future    Number of Occurrences:   1    Standing Expiration Date:   05/15/2018  . 24 Hr Ur UIFE/Light chains/TP Qnt    Standing Status:   Future    Number of Occurrences:   1    Standing Expiration Date:   05/15/2018    Garrett Staging Multiple myeloma not having achieved remission ([place]) Staging form: Plasma Cell Myeloma and Plasma Cell Disorders, AJCC 8th Edition - Clinical stage from 01/24/2017: RISS Stage II (Beta-2-microglobulin (mg/L): 2.3, Albumin (g/dL): 2.9, ISS: Stage II, High-risk cytogenetics: Absent, LDH: Normal) - Signed by [name], MD on 02/21/2017   All questions were answered.  . The patient knows to call the clinic with any problems, questions or concerns.  This note was electronically signed.    History of Presenting Illness Christine Garrett is a  62 y.o. female followed in the [place] for diagnosis of active IgG kappa  multiple myeloma. Please see oncologic history below for details.  Patient initially presented with epigastric abdominal pain that started in Apr 2018. Patient was previously self medicating with Aleve and Naprosyn or chronic pains. She did not have any melena or hematochezia. Due to the intensity of the pain, patient underwent EGD on 11/29/16 that demonstrates presence  of H. pylori-associated gastritis. At the time of presentation, family reported weight loss of 60 pounds over 3-4 month period. Previously, patient has been habitually drinking alcohol up to 6 beverages per day, quit drinking mid-May 2018. Continued to have abdominal pain and underwent imaging assessment with CT of the abdomen and pelvis on 12/16/16 did demonstrate presence of a mass lesion with spinal cord compression at level of T8. Compression was relieved surgically. She has been recovering well from surgery.  Patient returns to the clinic for possible initiation of the sixth cycle of therapy.  Patient reports interim decline in appetite, worsening fatigue, persistent and worsening nausea that did not fully respond to other currently prescribed medications.  Reports watery diarrhea with 2 bowel movements per day, no hematochezia or melena.  Denies any interval fever, chills, night sweats.  No abdominal pain.  No dysuria or hematuria.  Oncological/hematological History: --Labs, 11/22/16: Ca 9.5, Cr 0.86; WBC 5.6, Hgb 12.2, Plt 270;  --EGD, 11/29/16: Gastritis positive for H. pylori. Treated with omeprazole, clarithromycin, and amoxicillin x14d  --CT A/P, 12/16/16: Left T8 lytic lesion with soft tissue extension into the spinal canal with cord compression. Fatty liver measuring 19.6cm. 0.3cm lt lower pole renal calculus, no hydronephrosis;  --MRI T-spine, 12/17/16: An enhancing mass on the left side at T8 invading the canal and compressing the spinal cord.   Oncological/hematological History:   Multiple myeloma not having achieved remission ([place])   12/26/2016 Initial Diagnosis    Multiple myeloma not having achieved remission [facility]): Originally presented with a mass resulting in compression of the spinal cord. Underwent surgery on 12/26/16 for decompression. --Initial Surgical Pathology: Plasma cell neoplasm involvement, IHC -- positive for CD138, CD79a, CD56, LCA, CD43, kappa light chains &  negative for CD20, lambda light chains; --Baseline labs, 12/30/16: tProt 7.4, Alb 2.9, Ca 9.1, Cr 0.96, AP 77; SPEP/SIFE -- M-Spike 1.0g/dL, monoclonal IgG kappa present; IgA 51, IgG 1477, IgM 17; kappa 77.6, lambda 7.4, KLR 10.35; LDH 161, beta-2 microglobulin 2.3; WBC 10.8, Hgb 11.2, Plt 298;      01/16/2017 Imaging    PET-CT: Positive pathological uptake in T9 and proximal right tibia. No other hypermetabolic disease      12/18/812 Pathology Results    Bone Marrow Biopsy: Hypercellular bone marrow with 16% involvement by plasma cell process FISH: positive for [id number] loss, 13q34 loss, negative for del p53      01/30/2017 -  Chemotherapy    RVd: Lenalidomide 48m PO QDay, d1-14 + bortezomib 1.329mm2 d1,8,15 + low-dose dexamethasone 4061mO QWk Q21d --Cycle #1, 01/30/17: Complete located by grade 1 nausea --Cycle #2, 02/29/96/02omplicated by grade 2 nausea, grade 1 fatigue --Cycle #3, 09/63/78/58omplicated by progressive LE weakness --Cycle #4, 04/03/17: Bortezomib dose reduced to 1mg37m --Cycle #5, 04/24/17: Bortezomib dose reduced to 1mg/70m complicated by watery diarrhea and nausea, grade 2 --Cycle #6, ...: delayed due to complications of the previous cycle       04/15/2017 Imaging    PET-CT: No residual hypermetabolic activity in the skeletal structures.      04/21/2017 Pathology Results    BM Bx: --Mildly hypercellular bone marrow with 2% plasma cells with  polyclonal appearance. --Flow Cyto: No evidence of monoclonal plasma cell or lymphocyte populations. --CytoGen: 46,XX, no clonal chromosomal abnormalities       Medical History: Past Medical History:  Diagnosis Date  . Arthritis    knees  . Garrett ([place])   . Carpal tunnel syndrome   . CHF (congestive heart failure) ([place])   . Diabetes mellitus    type 2  . GERD (gastroesophageal reflux disease)   . Heart murmur    "Little, No concerns" per Dr  Dannielle Burn pt reported.  . Hypertension    controlled using a guided approch  with plasma renin activity  . Multiple myeloma not having achieved remission ([place]) 01/06/2017  . Neuropathy   . Peripheral vascular disease ([place])   . Sleep apnea    Uses CPAP    Surgical History: Past Surgical History:  Procedure Laterality Date  . BIOPSY  11/28/2016   Procedure: BIOPSY;  Surgeon: Rogene Houston, MD;  [facility];  Service: Endoscopy;;  gastric  . [place]  . CERVICAL CONE BIOPSY     cervical lesion  . ESOPHAGOGASTRODUODENOSCOPY (EGD) WITH PROPOFOL N/A 11/28/2016   Procedure: ESOPHAGOGASTRODUODENOSCOPY (EGD) WITH PROPOFOL;  Surgeon: Rogene Houston, MD;  [facility];  Service: Endoscopy;  Laterality: N/A;  9:25  . IR FLUORO GUIDE PORT INSERTION RIGHT  01/29/2017  . IR US GUIDE VASC ACCESS RIGHT  01/29/2017  . lipoma removal     right shoulder 2001  . MULTIPLE EXTRACTIONS WITH ALVEOLOPLASTY Bilateral 08/24/2012   Procedure: MULTIPLE EXTRACTION WITH ALVEOLOPLASTY BIOPSY OF RIGHT AND LEFT MANDIBLE ;  Surgeon: Gae Bon, DDS;  Location: [place];  Service: Oral Surgery;  Laterality: Bilateral;  . POSTERIOR LUMBAR FUSION 4 LEVEL N/A 12/26/2016   Procedure: THORACIC EIGHT TUMOR RESECTION, THORACIC SIX- THORACIC TEN POSTERIOR SPINAL FUSION;  Surgeon: Ashok Pall, MD;  Location: [place];  Service: Neurosurgery;  Laterality: N/A;  THORACIC 8 TUMOR RESECTION, THORACIC 6- THORACIC 10 POSTERIOR SPINAL FUSION  . ROTATOR CUFF REPAIR     right shoulder  . TUBAL LIGATION      Family History: Family History  Problem Relation Age of Onset  . Diabetes Mother   . Hypertension Mother   . Heart disease Mother   . Diabetes Father   . Heart disease Sister   . Diabetes Sister     Social History: Social History   Socioeconomic History  . Marital status: Single    Spouse name: Not on file  . Number of children: Not on file  . Years of education: Not on file  . Highest education level: Not on file  Social Needs  . Financial resource strain: Not on  file  . Food insecurity - worry: Not on file  . Food insecurity - inability: Not on file  . Transportation needs - medical: Not on file  . Transportation needs - non-medical: Not on file  Occupational History  . Not on file  Tobacco Use  . Smoking status: Former Smoker    Packs/day: 0.50    Years: 6.00    Pack years: 3.00    Types: Cigarettes    Last attempt to quit: 11/23/2011    Years since quitting: 5.5  . Smokeless tobacco: Never Used  . Tobacco comment: quit summer 2013  Substance and Sexual Activity  . Alcohol use: No    Alcohol/week: 3.6 oz    Types: 6 Cans of beer per week  . Drug  use: No  . Sexual activity: Not Currently    Birth control/protection: Post-menopausal  Other Topics Concern  . Not on file  Social History Narrative  . Not on file    Allergies: Allergies  Allergen Reactions  . No Known Allergies     Medications:  Current Outpatient Medications  Medication Sig Dispense Refill  . acyclovir (ZOVIRAX) 400 MG tablet Take 1 tablet (400 mg total) by mouth 2 (two) times daily. 60 tablet 0  . chlorthalidone (HYGROTON) 25 MG tablet Take 25 mg by mouth 2 (two) times daily.   11  . dexamethasone (DECADRON) 4 MG tablet Take 10 tablets (40 mg) on days 1, 8, and 15 of chemo. Repeat every 21 days. 30 tablet 3  . diltiazem (CARDIZEM CD) 300 MG 24 hr capsule Take 362ms by mouth daily  3  . enalapril (VASOTEC) 20 MG tablet Take 20 mg by mouth 2 (two) times daily.     .Marland Kitchengabapentin (NEURONTIN) 300 MG capsule Take 1 capsule (300 mg total) by mouth 3 (three) times daily. 63 capsule 3  . insulin degludec (TRESIBA FLEXTOUCH) 100 UNIT/ML SOPN FlexTouch Pen Inject 0.5 mLs (50 Units total) into the skin daily at 10 pm. 5 pen 2  . Insulin Pen Needle (B-D ULTRAFINE III SHORT PEN) 31G X 8 MM MISC 1 each by Does not apply route as directed. 100 each 3  . KLOR-CON M20 20 MEQ tablet TAKE 1 TABLET BY MOUTH EVERY DAY 30 tablet 2  . lenalidomide (REVLIMID) 25 MG capsule Take one capsule  (266m by mouth once daily on days 1-14 every 21 days. Auth# [id number]/30/[id number] 14 capsule 0  . lidocaine-prilocaine (EMLA) cream Apply to affected area once 30 g 3  . LORazepam (ATIVAN) 0.5 MG tablet Take 1 tablet (0.5 mg total) by mouth every 6 (six) hours as needed (Nausea or vomiting). 30 tablet 0  . metFORMIN (GLUCOPHAGE) 1000 MG tablet Take 1,000 mg by mouth 2 (two) times daily.     . metoCLOPramide (REGLAN) 10 MG tablet Take 1 tablet (10 mg total) every 6 (six) hours by mouth. 30 tablet 0  . nicotine (NICODERM CQ - DOSED IN MG/24 HOURS) 14 mg/24hr patch Place 1 patch (14 mg total) onto the skin daily. (Patient not taking: Reported on 05/01/2017) 28 patch 0  . nicotine polacrilex (COMMIT) 2 MG lozenge Take 1 lozenge (2 mg total) by mouth as needed for smoking cessation. (Patient not taking: Reported on 05/01/2017) 100 tablet 0  . omeprazole (PRILOSEC) 20 MG capsule Take 1 capsule (20 mg total) by mouth 2 (two) times daily before a meal. 60 capsule 0  . ondansetron (ZOFRAN) 8 MG tablet Take 1 tablet (8 mg total) by mouth 2 (two) times daily as needed (Nausea or vomiting). 30 tablet 1  . oxyCODONE (OXY IR/ROXICODONE) 5 MG immediate release tablet Take 1 tablet (5 mg total) by mouth every 4 (four) hours as needed for severe pain. (Patient not taking: Reported on 06/02/2017) 30 tablet 0  . prochlorperazine (COMPAZINE) 10 MG tablet Take 1 tablet (10 mg total) by mouth every 6 (six) hours as needed (Nausea or vomiting). 30 tablet 1  . rivaroxaban (XARELTO) 10 MG TABS tablet Take 1 tablet (10 mg total) by mouth daily. 30 tablet 0  . tiZANidine (ZANAFLEX) 4 MG tablet Take 1 tablet (4 mg total) by mouth every 6 (six) hours as needed for muscle spasms. 60 tablet 0   Current Facility-Administered Medications  Medication Dose Route Frequency Provider  Last Rate Last Dose  . sodium chloride flush (NS) 0.9 % injection 10 mL  10 mL Intravenous PRN [name], MD   10 mL at 05/15/17 1408    Review of  Systems: Review of Systems  Constitutional: Positive for appetite change and fatigue.  Gastrointestinal: Positive for diarrhea and nausea.  All other systems reviewed and are negative.    PHYSICAL EXAMINATION Blood pressure 116/63, pulse 74, temperature 98.4 F (36.9 C), temperature source Oral, resp. rate 18, height '5\' 9"'  (1.753 m), weight 195 lb 14.4 oz (88.9 kg), SpO2 100 %.  ECOG PERFORMANCE STATUS: 2 - Symptomatic, <50% confined to bed  Physical Exam  Constitutional: She is oriented to person, place, and time and well-developed, well-nourished, and in no distress.  HENT:  Head: Normocephalic and atraumatic.  Mouth/Throat: Oropharynx is clear and moist. No oropharyngeal exudate.  Eyes: EOM are normal. Pupils are equal, round, and reactive to light. No scleral icterus.  Neck: No thyromegaly present.  Cardiovascular: Normal rate, regular rhythm and normal heart sounds. Exam reveals no gallop.  No murmur heard. Pulmonary/Chest: Effort normal and breath sounds normal. She has no wheezes.  Abdominal: Soft. Bowel sounds are normal. She exhibits no distension and no mass. There is no tenderness. There is no rebound.  Musculoskeletal: Normal range of motion. She exhibits no edema.       Right shoulder: She exhibits no tenderness, no bony tenderness and no swelling.  Neurological: She is alert and oriented to person, place, and time.  Patient ambulates with difficulty and uses a walker.     LABORATORY DATA: I have personally reviewed the data as listed: Appointment on 05/15/2017  Component Date Value Ref Range Status  . IgG, Qn, Serum 05/15/2017 699* 700 - 1,600 mg/dL Final  . IgA, Qn, Serum 05/15/2017 184  87 - 352 mg/dL Final  . IgM, Qn, Serum 05/15/2017 48  26 - 217 mg/dL Final  . Total Protein 05/15/2017 6.3  6.0 - 8.5 g/dL Final  . Albumin SerPl Elph-Mcnc 05/15/2017 3.4  2.9 - 4.4 g/dL Final  . Alpha 1 05/15/2017 0.3  0.0 - 0.4 g/dL Final  . Alpha2 Glob SerPl Elph-Mcnc  05/15/2017 0.9  0.4 - 1.0 g/dL Final  . B-Globulin SerPl Elph-Mcnc 05/15/2017 1.0  0.7 - 1.3 g/dL Final  . Gamma Glob SerPl Elph-Mcnc 05/15/2017 0.8  0.4 - 1.8 g/dL Final  . M Protein SerPl Elph-Mcnc 05/15/2017 0.2* Not Observed g/dL Final  . Globulin, Total 05/15/2017 2.9  2.2 - 3.9 g/dL Final  . Albumin/Glob SerPl 05/15/2017 1.2  0.7 - 1.7 Final  . IFE 1 05/15/2017 Comment   Final   Comment: Immunofixation shows IgG monoclonal protein with kappa light chain specificity.   . Please Note 05/15/2017 Comment   Final   Comment: Protein electrophoresis scan will follow via computer, mail, or courier delivery.   Harriet Pho Free Light Chain 05/15/2017 25.9* 3.3 - 19.4 mg/L Final  . Ig Lambda Free Light Chain 05/15/2017 20.2  5.7 - 26.3 mg/L Final  . Kappa/Lambda FluidC Ratio 05/15/2017 1.28  0.26 - 1.65 Final  . Beta-2 05/15/2017 2.2  0.6 - 2.4 mg/L Final   Siemens Immulite 2000 Immunochemiluminometric assay (ICMA)  . Sodium 05/15/2017 139  136 - 145 mEq/L Final  . Potassium 05/15/2017 3.3* 3.5 - 5.1 mEq/L Final  . Chloride 05/15/2017 104  98 - 109 mEq/L Final  . CO2 05/15/2017 24  22 - 29 mEq/L Final  . Glucose 05/15/2017 107  70 - 140 mg/dl Final   Glucose reference range is for nonfasting patients. Fasting glucose reference range is 70- 100.  Marland Kitchen BUN 05/15/2017 12.2  7.0 - 26.0 mg/dL Final  . Creatinine 05/15/2017 1.0  0.6 - 1.1 mg/dL Final  . Total Bilirubin 05/15/2017 0.42  0.20 - 1.20 mg/dL Final  . Alkaline Phosphatase 05/15/2017 73  40 - 150 U/L Final  . AST 05/15/2017 11  5 - 34 U/L Final  . ALT 05/15/2017 10  0 - 55 U/L Final  . Total Protein 05/15/2017 6.8  6.4 - 8.3 g/dL Final  . Albumin 05/15/2017 3.6  3.5 - 5.0 g/dL Final  . Calcium 05/15/2017 9.3  8.4 - 10.4 mg/dL Final  . Anion Gap 05/15/2017 11  3 - 11 mEq/L Final  . EGFR 05/15/2017 >60  >60 ml/min/1.73 m2 Final   eGFR is calculated using the CKD-EPI Creatinine Equation (2009)  . WBC 05/15/2017 4.5  3.9 - 10.3 10e3/uL  Final  . NEUT# 05/15/2017 2.4  1.5 - 6.5 10e3/uL Final  . HGB 05/15/2017 10.0* 11.6 - 15.9 g/dL Final  . HCT 05/15/2017 30.1* 34.8 - 46.6 % Final  . Platelets 05/15/2017 300  145 - 400 10e3/uL Final  . MCV 05/15/2017 91.8  79.5 - 101.0 fL Final  . MCH 05/15/2017 30.6  25.1 - 34.0 pg Final  . MCHC 05/15/2017 33.3  31.5 - 36.0 g/dL Final  . RBC 05/15/2017 3.28* 3.70 - 5.45 10e6/uL Final  . RDW 05/15/2017 15.2* 11.2 - 14.5 % Final  . lymph# 05/15/2017 1.2  0.9 - 3.3 10e3/uL Final  . MONO# 05/15/2017 0.3  0.1 - 0.9 10e3/uL Final  . Eosinophils Absolute 05/15/2017 0.5  0.0 - 0.5 10e3/uL Final  . Basophils Absolute 05/15/2017 0.1  0.0 - 0.1 10e3/uL Final  . NEUT% 05/15/2017 52.4  38.4 - 76.8 % Final  . LYMPH% 05/15/2017 26.6  14.0 - 49.7 % Final  . MONO% 05/15/2017 7.3  0.0 - 14.0 % Final  . EOS% 05/15/2017 10.8* 0.0 - 7.0 % Final  . BASO% 05/15/2017 2.9* 0.0 - 2.0 % Final       [name], MD

## 2017-06-21 DIAGNOSIS — Z79899 Other long term (current) drug therapy: Secondary | ICD-10-CM | POA: Diagnosis not present

## 2017-06-21 DIAGNOSIS — Z5112 Encounter for antineoplastic immunotherapy: Secondary | ICD-10-CM | POA: Diagnosis not present

## 2017-06-21 DIAGNOSIS — C9000 Multiple myeloma not having achieved remission: Secondary | ICD-10-CM | POA: Diagnosis not present

## 2017-06-21 NOTE — Progress Notes (Signed)
[facility] Follow-up Visit:  Assessment: Multiple myeloma not having achieved remission [facility]) 62 y.o. female who initially presented with significant weight loss in the context of H. pylori-associated gastritis and alcohol abuse. At the same time, an incidental discovery of a lower thoracic vertebral mass in the context of chronic numbness and weakness in the lower extremities. MRI of the thoracic spine demonstrated a destructive lesion at T8 level and patient underwent surgical treatment. 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 Staging evaluation conducted by our clinic confirmed presence of IgG kappa multiple myeloma. Cytogenetics studies demonstrated normal karyotype, FISH studies are positive for 13q34 & D13S319 loss and no loss of p53. Based on initial information, patient is now staged at [facility].   After the patient has received 4 cycles of systemic therapy, she underwent reassessment with both PET/CT and a bone marrow biopsy.  Both studies are consistent with complete disease response to the instituted therapy.  Currently, plan is to complete 6 cycles of systemic therapy followed by consolidation with high-dose chemotherapy/autologous stem cell rescue.  Patient has fully recovered from the side effects presented last week.  Clinical evaluation lab work today permissive to proceed with the next cycle of systemic therapy.  Plan: --Proceed with Cycle #6 of RVd + zoledronic acid  --Supportive care with Bactrim and acyclovir to reduce the risk of PCP and VZV infections respectively; at the last visit, patient was started on prophylactic dose of Rivaroxaban (Xarelto) due to superficial venous thrombosis and as a prophylaxis for thrombosis due to use of lenalidomide. --Patient will return to our clinic in 4 weeks with lab work to monitor treatment toxicity --Will refer patient back to [facility] BMT service for reevaluation for consolidation with  high-dose chemotherapy and autologous stem cell rescue.  Voice recognition software was used and creation of this note. Despite my best effort at editing the text, some misspelling/errors may have occurred.  Orders Placed This Encounter  Procedures  . CBC with Differential    Standing Status:   Future    Standing Expiration Date:   05/22/2018  . Comprehensive metabolic panel    Standing Status:   Future    Standing Expiration Date:   05/22/2018  . Lactate dehydrogenase (LDH)    Standing Status:   Future    Standing Expiration Date:   05/22/2018  . Multiple Myeloma Panel (SPEP&IFE w/QIG)    Standing Status:   Future    Standing Expiration Date:   05/22/2018  . Kappa/lambda light chains    Standing Status:   Future    Standing Expiration Date:   05/22/2018  . 24-Hr Ur UPEP/UIFE/Light Chains/TP    Standing Status:   Future    Standing Expiration Date:   05/22/2018    Cancer Staging Multiple myeloma not having achieved remission ([place]) Staging form: Plasma Cell Myeloma and Plasma Cell Disorders, AJCC 8th Edition - Clinical stage from 01/24/2017: RISS Stage II (Beta-2-microglobulin (mg/L): 2.3, Albumin (g/dL): 2.9, ISS: Stage II, High-risk cytogenetics: Absent, LDH: Normal) - Signed by [name], MD on 02/21/2017   All questions were answered.  . The patient knows to call the clinic with any problems, questions or concerns.  This note was electronically signed.    History of Presenting Illness [facility] is a  62 y.o. female followed in the [facility] for diagnosis of active IgG kappa multiple myeloma. Please see oncologic history below for details.  Patient initially presented with epigastric abdominal pain  that started in Apr 2018. Patient was previously self medicating with Aleve and Naprosyn or chronic pains. She did not have any melena or hematochezia. Due to the intensity of the pain, patient underwent EGD on 11/29/16 that demonstrates presence of H.  pylori-associated gastritis. At the time of presentation, family reported weight loss of 60 pounds over 3-4 month period. Previously, patient has been habitually drinking alcohol up to 6 beverages per day, quit drinking mid-May 2018. Continued to have abdominal pain and underwent imaging assessment with CT of the abdomen and pelvis on 12/16/16 did demonstrate presence of a mass lesion with spinal cord compression at level of T8. Compression was relieved surgically. She has been recovering well from surgery.  Patient returns to the clinic for possible initiation of the sixth cycle of therapy.  Initiation of the cycle was delayed due to deteriorating performance status, nausea, and diarrhea.  Symptoms appear to have improved significantly since the last week.  Patient is now feeling ready to proceed with the final cycle of induction therapy..  Oncological/hematological History: --Labs, 11/22/16: Ca 9.5, Cr 0.86; WBC 5.6, Hgb 12.2, Plt 270;  --EGD, 11/29/16: Gastritis positive for H. pylori. Treated with omeprazole, clarithromycin, and amoxicillin x14d  --CT A/P, 12/16/16: Left T8 lytic lesion with soft tissue extension into the spinal canal with cord compression. Fatty liver measuring 19.6cm. 0.3cm lt lower pole renal calculus, no hydronephrosis;  --MRI T-spine, 12/17/16: An enhancing mass on the left side at T8 invading the canal and compressing the spinal cord.   Oncological/hematological History:   Multiple myeloma not having achieved remission ([place])   12/26/2016 Initial Diagnosis    Multiple myeloma not having achieved remission [facility]): Originally presented with a mass resulting in compression of the spinal cord. Underwent surgery on 12/26/16 for decompression. --Initial Surgical Pathology: Plasma cell neoplasm involvement, IHC -- positive for CD138, CD79a, CD56, LCA, CD43, kappa light chains & negative for CD20, lambda light chains; --Baseline labs, 12/30/16: tProt 7.4, Alb 2.9, Ca 9.1, Cr 0.96,  AP 77; SPEP/SIFE -- M-Spike 1.0g/dL, monoclonal IgG kappa present; IgA 51, IgG 1477, IgM 17; kappa 77.6, lambda 7.4, KLR 10.35; LDH 161, beta-2 microglobulin 2.3; WBC 10.8, Hgb 11.2, Plt 298;      01/16/2017 Imaging    PET-CT: Positive pathological uptake in T9 and proximal right tibia. No other hypermetabolic disease      01/23/2118 Pathology Results    Bone Marrow Biopsy: Hypercellular bone marrow with 16% involvement by plasma cell process FISH: positive for [id number] loss, 13q34 loss, negative for del p53      01/30/2017 -  Chemotherapy    RVd: Lenalidomide 47m PO QDay, d1-14 + bortezomib 1.338mm2 d1,8,15 + low-dose dexamethasone 4066mO QWk Q21d --Cycle #1, 01/30/17: Complete located by grade 1 nausea --Cycle #2, 09/44/81/85omplicated by grade 2 nausea, grade 1 fatigue --Cycle #3, 09/63/14/97omplicated by progressive LE weakness --Cycle #4, 04/03/17: Bortezomib dose reduced to 1mg74m --Cycle #5, 04/24/17: Bortezomib dose reduced to 1mg/6m complicated by watery diarrhea and nausea, grade 2 --Cycle #6, 05/22/17: delayed due to complications of the previous cycle       04/15/2017 Imaging    PET-CT: No residual hypermetabolic activity in the skeletal structures.      04/21/2017 Pathology Results    BM Bx: --Mildly hypercellular bone marrow with 2% plasma cells with polyclonal appearance. --Flow Cyto: No evidence of monoclonal plasma cell or lymphocyte populations. --CytoGen: 46,XX, no clonal chromosomal abnormalities       Medical History: Past Medical History:  Diagnosis Date  . Arthritis    knees  . Cancer ([place])   . Carpal tunnel syndrome   . CHF (congestive heart failure) ([place])   . Diabetes mellitus    type 2  . GERD (gastroesophageal reflux disease)   . Heart murmur    "Little, No concerns" per Dr  Dannielle Burn pt reported.  . Hypertension    controlled using a guided approch with plasma renin activity  . Multiple myeloma not having achieved remission ([place]) 01/06/2017  .  Neuropathy   . Peripheral vascular disease ([place])   . Sleep apnea    Uses CPAP    Surgical History: Past Surgical History:  Procedure Laterality Date  . BIOPSY  11/28/2016   Procedure: BIOPSY;  Surgeon: Rogene Houston, MD;  [facility];  Service: Endoscopy;;  gastric  . [place]  . CERVICAL CONE BIOPSY     cervical lesion  . ESOPHAGOGASTRODUODENOSCOPY (EGD) WITH PROPOFOL N/A 11/28/2016   Procedure: ESOPHAGOGASTRODUODENOSCOPY (EGD) WITH PROPOFOL;  Surgeon: Rogene Houston, MD;  [facility];  Service: Endoscopy;  Laterality: N/A;  9:25  . IR FLUORO GUIDE PORT INSERTION RIGHT  01/29/2017  . IR US GUIDE VASC ACCESS RIGHT  01/29/2017  . lipoma removal     right shoulder 2001  . MULTIPLE EXTRACTIONS WITH ALVEOLOPLASTY Bilateral 08/24/2012   Procedure: MULTIPLE EXTRACTION WITH ALVEOLOPLASTY BIOPSY OF RIGHT AND LEFT MANDIBLE ;  Surgeon: Gae Bon, DDS;  Location: [facility];  Service: Oral Surgery;  Laterality: Bilateral;  . POSTERIOR LUMBAR FUSION 4 LEVEL N/A 12/26/2016   Procedure: THORACIC EIGHT TUMOR RESECTION, THORACIC SIX- THORACIC TEN POSTERIOR SPINAL FUSION;  Surgeon: Ashok Pall, MD;  Location: [facility];  Service: Neurosurgery;  Laterality: N/A;  THORACIC 8 TUMOR RESECTION, THORACIC 6- THORACIC 10 POSTERIOR SPINAL FUSION  . ROTATOR CUFF REPAIR     right shoulder  . TUBAL LIGATION      Family History: Family History  Problem Relation Age of Onset  . Diabetes Mother   . Hypertension Mother   . Heart disease Mother   . Diabetes Father   . Heart disease Sister   . Diabetes Sister     Social History: Social History   Socioeconomic History  . Marital status: Single    Spouse name: Not on file  . Number of children: Not on file  . Years of education: Not on file  . Highest education level: Not on file  Social Needs  . Financial resource strain: Not on file  . Food insecurity - worry: Not on file  . Food insecurity - inability: Not on file  .  Transportation needs - medical: Not on file  . Transportation needs - non-medical: Not on file  Occupational History  . Not on file  Tobacco Use  . Smoking status: Former Smoker    Packs/day: 0.50    Years: 6.00    Pack years: 3.00    Types: Cigarettes    Last attempt to quit: 11/23/2011    Years since quitting: 5.5  . Smokeless tobacco: Never Used  . Tobacco comment: quit summer 2013  Substance and Sexual Activity  . Alcohol use: No    Alcohol/week: 3.6 oz    Types: 6 Cans of beer per week  . Drug use: No  . Sexual activity: Not Currently    Birth control/protection: Post-menopausal  Other Topics Concern  . Not on file  Social History Narrative  . Not on  file    Allergies: Allergies  Allergen Reactions  . No Known Allergies     Medications:  Current Outpatient Medications  Medication Sig Dispense Refill  . chlorthalidone (HYGROTON) 25 MG tablet Take 25 mg by mouth 2 (two) times daily.   11  . dexamethasone (DECADRON) 4 MG tablet Take 10 tablets (40 mg) on days 1, 8, and 15 of chemo. Repeat every 21 days. 30 tablet 3  . diltiazem (CARDIZEM CD) 300 MG 24 hr capsule Take 342ms by mouth daily  3  . enalapril (VASOTEC) 20 MG tablet Take 20 mg by mouth 2 (two) times daily.     . insulin degludec (TRESIBA FLEXTOUCH) 100 UNIT/ML SOPN FlexTouch Pen Inject 0.5 mLs (50 Units total) into the skin daily at 10 pm. 5 pen 2  . Insulin Pen Needle (B-D ULTRAFINE III SHORT PEN) 31G X 8 MM MISC 1 each by Does not apply route as directed. 100 each 3  . lenalidomide (REVLIMID) 25 MG capsule Take one capsule (233m by mouth once daily on days 1-14 every 21 days. Auth# [id number]/30/[id number] 14 capsule 0  . lidocaine-prilocaine (EMLA) cream Apply to affected area once 30 g 3  . LORazepam (ATIVAN) 0.5 MG tablet Take 1 tablet (0.5 mg total) by mouth every 6 (six) hours as needed (Nausea or vomiting). 30 tablet 0  . metFORMIN (GLUCOPHAGE) 1000 MG tablet Take 1,000 mg by mouth 2 (two) times daily.     .  metoCLOPramide (REGLAN) 10 MG tablet Take 1 tablet (10 mg total) every 6 (six) hours by mouth. 30 tablet 0  . ondansetron (ZOFRAN) 8 MG tablet Take 1 tablet (8 mg total) by mouth 2 (two) times daily as needed (Nausea or vomiting). 30 tablet 1  . oxyCODONE (OXY IR/ROXICODONE) 5 MG immediate release tablet Take 1 tablet (5 mg total) by mouth every 4 (four) hours as needed for severe pain. (Patient not taking: Reported on 06/02/2017) 30 tablet 0  . prochlorperazine (COMPAZINE) 10 MG tablet Take 1 tablet (10 mg total) by mouth every 6 (six) hours as needed (Nausea or vomiting). 30 tablet 1  . tiZANidine (ZANAFLEX) 4 MG tablet Take 1 tablet (4 mg total) by mouth every 6 (six) hours as needed for muscle spasms. 60 tablet 0  . acyclovir (ZOVIRAX) 400 MG tablet Take 1 tablet (400 mg total) by mouth 2 (two) times daily. 60 tablet 0  . gabapentin (NEURONTIN) 300 MG capsule Take 1 capsule (300 mg total) by mouth 3 (three) times daily. 63 capsule 3  . KLOR-CON M20 20 MEQ tablet TAKE 1 TABLET BY MOUTH EVERY DAY 30 tablet 2  . nicotine (NICODERM CQ - DOSED IN MG/24 HOURS) 14 mg/24hr patch Place 1 patch (14 mg total) onto the skin daily. (Patient not taking: Reported on 05/01/2017) 28 patch 0  . nicotine polacrilex (COMMIT) 2 MG lozenge Take 1 lozenge (2 mg total) by mouth as needed for smoking cessation. (Patient not taking: Reported on 05/01/2017) 100 tablet 0  . omeprazole (PRILOSEC) 20 MG capsule Take 1 capsule (20 mg total) by mouth 2 (two) times daily before a meal. 60 capsule 0  . rivaroxaban (XARELTO) 10 MG TABS tablet Take 1 tablet (10 mg total) by mouth daily. 30 tablet 0   No current facility-administered medications for this visit.     Review of Systems: Review of Systems  Constitutional: Positive for fatigue. Negative for appetite change.  Gastrointestinal: Negative for diarrhea and nausea.  All other systems reviewed and are  negative.    PHYSICAL EXAMINATION Blood pressure 124/66, pulse 74,  temperature 98.4 F (36.9 C), temperature source Oral, resp. rate 17, height '5\' 9"'  (1.753 m), weight 197 lb (89.4 kg), SpO2 100 %.  ECOG PERFORMANCE STATUS: 2 - Symptomatic, <50% confined to bed  Physical Exam  Constitutional: She is oriented to person, place, and time and well-developed, well-nourished, and in no distress.  HENT:  Head: Normocephalic and atraumatic.  Mouth/Throat: Oropharynx is clear and moist. No oropharyngeal exudate.  Eyes: EOM are normal. Pupils are equal, round, and reactive to light. No scleral icterus.  Neck: No thyromegaly present.  Cardiovascular: Normal rate, regular rhythm and normal heart sounds. Exam reveals no gallop.  No murmur heard. Pulmonary/Chest: Effort normal and breath sounds normal. She has no wheezes.  Abdominal: Soft. Bowel sounds are normal. She exhibits no distension and no mass. There is no tenderness. There is no rebound.  Musculoskeletal: Normal range of motion. She exhibits no edema.       Right shoulder: She exhibits no tenderness, no bony tenderness and no swelling.  Neurological: She is alert and oriented to person, place, and time.  Patient ambulates with difficulty and uses a walker.     LABORATORY DATA: I have personally reviewed the data as listed: Appointment on 05/20/2017  Component Date Value Ref Range Status  . Phosphorus, Ser 05/20/2017 4.0  2.5 - 4.5 mg/dL Final  . Magnesium 05/20/2017 1.8  1.5 - 2.5 mg/dl Final  . Sodium 05/20/2017 140  136 - 145 mEq/L Final  . Potassium 05/20/2017 3.1* 3.5 - 5.1 mEq/L Final  . Chloride 05/20/2017 103  98 - 109 mEq/L Final  . CO2 05/20/2017 25  22 - 29 mEq/L Final  . Glucose 05/20/2017 106  70 - 140 mg/dl Final   Glucose reference range is for nonfasting patients. Fasting glucose reference range is 70- 100.  Marland Kitchen BUN 05/20/2017 16.1  7.0 - 26.0 mg/dL Final  . Creatinine 05/20/2017 1.3* 0.6 - 1.1 mg/dL Final  . Total Bilirubin 05/20/2017 0.35  0.20 - 1.20 mg/dL Final  . Alkaline  Phosphatase 05/20/2017 71  40 - 150 U/L Final  . AST 05/20/2017 9  5 - 34 U/L Final  . ALT 05/20/2017 8  0 - 55 U/L Final  . Total Protein 05/20/2017 7.2  6.4 - 8.3 g/dL Final  . Albumin 05/20/2017 3.8  3.5 - 5.0 g/dL Final  . Calcium 05/20/2017 9.7  8.4 - 10.4 mg/dL Final  . Anion Gap 05/20/2017 11  3 - 11 mEq/L Final  . EGFR 05/20/2017 54* >60 ml/min/1.73 m2 Final   eGFR is calculated using the CKD-EPI Creatinine Equation (2009)  . WBC 05/20/2017 4.4  3.9 - 10.3 10e3/uL Final  . NEUT# 05/20/2017 2.0  1.5 - 6.5 10e3/uL Final  . HGB 05/20/2017 10.6* 11.6 - 15.9 g/dL Final  . HCT 05/20/2017 31.8* 34.8 - 46.6 % Final  . Platelets 05/20/2017 253  145 - 400 10e3/uL Final  . MCV 05/20/2017 90.6  79.5 - 101.0 fL Final  . MCH 05/20/2017 30.2  25.1 - 34.0 pg Final  . MCHC 05/20/2017 33.3  31.5 - 36.0 g/dL Final  . RBC 05/20/2017 3.51* 3.70 - 5.45 10e6/uL Final  . RDW 05/20/2017 13.8  11.2 - 14.5 % Final  . lymph# 05/20/2017 1.7  0.9 - 3.3 10e3/uL Final  . MONO# 05/20/2017 0.3  0.1 - 0.9 10e3/uL Final  . Eosinophils Absolute 05/20/2017 0.3  0.0 - 0.5 10e3/uL Final  . Basophils  Absolute 05/20/2017 0.1  0.0 - 0.1 10e3/uL Final  . NEUT% 05/20/2017 45.1  38.4 - 76.8 % Final  . LYMPH% 05/20/2017 39.3  14.0 - 49.7 % Final  . MONO% 05/20/2017 6.4  0.0 - 14.0 % Final  . EOS% 05/20/2017 6.9  0.0 - 7.0 % Final  . BASO% 05/20/2017 2.3* 0.0 - 2.0 % Final       [name], MD

## 2017-06-21 NOTE — Assessment & Plan Note (Signed)
62 y.o. female who initially presented with significant weight loss in the context of H. pylori-associated gastritis and alcohol abuse. At the same time, an incidental discovery of a lower thoracic vertebral mass in the context of chronic numbness and weakness in the lower extremities. MRI of the thoracic spine demonstrated a destructive lesion at T8 level and patient underwent surgical treatment. 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 Staging evaluation conducted by our clinic confirmed presence of IgG kappa multiple myeloma. Cytogenetics studies demonstrated normal karyotype, FISH studies are positive for 13q34 & D13S319 loss and no loss of p53. Based on initial information, patient is now staged at [place].   After the patient has received 4 cycles of systemic therapy, she underwent reassessment with both PET/CT and a bone marrow biopsy.  Both studies are consistent with complete disease response to the instituted therapy.  Currently, plan is to complete 6 cycles of systemic therapy followed by consolidation with high-dose chemotherapy/autologous stem cell rescue.  Patient has fully recovered from the side effects presented last week.  Clinical evaluation lab work today permissive to proceed with the next cycle of systemic therapy.  Plan: --Proceed with Cycle #6 of RVd + zoledronic acid  --Supportive care with Bactrim and acyclovir to reduce the risk of PCP and VZV infections respectively; at the last visit, patient was started on prophylactic dose of Rivaroxaban (Xarelto) due to superficial venous thrombosis and as a prophylaxis for thrombosis due to use of lenalidomide. --Patient will return to our clinic in 4 weeks with lab work to monitor treatment toxicity --Will refer patient back to [facility] BMT service for reevaluation for consolidation with high-dose chemotherapy and autologous stem cell rescue.

## 2017-06-22 DIAGNOSIS — C9000 Multiple myeloma not having achieved remission: Secondary | ICD-10-CM | POA: Diagnosis not present

## 2017-06-22 DIAGNOSIS — Z5111 Encounter for antineoplastic chemotherapy: Secondary | ICD-10-CM | POA: Diagnosis not present

## 2017-06-22 DIAGNOSIS — Z79899 Other long term (current) drug therapy: Secondary | ICD-10-CM | POA: Diagnosis not present

## 2017-06-23 DIAGNOSIS — Z452 Encounter for adjustment and management of vascular access device: Secondary | ICD-10-CM | POA: Diagnosis not present

## 2017-06-23 DIAGNOSIS — Z52011 Autologous donor, stem cells: Secondary | ICD-10-CM | POA: Diagnosis not present

## 2017-06-23 DIAGNOSIS — C9000 Multiple myeloma not having achieved remission: Secondary | ICD-10-CM | POA: Diagnosis not present

## 2017-06-24 DIAGNOSIS — Z79899 Other long term (current) drug therapy: Secondary | ICD-10-CM | POA: Diagnosis not present

## 2017-06-24 DIAGNOSIS — Z52011 Autologous donor, stem cells: Secondary | ICD-10-CM | POA: Diagnosis not present

## 2017-06-24 DIAGNOSIS — C9000 Multiple myeloma not having achieved remission: Secondary | ICD-10-CM | POA: Diagnosis not present

## 2017-06-25 DIAGNOSIS — C9000 Multiple myeloma not having achieved remission: Secondary | ICD-10-CM | POA: Diagnosis not present

## 2017-06-25 DIAGNOSIS — Z52011 Autologous donor, stem cells: Secondary | ICD-10-CM | POA: Diagnosis not present

## 2017-06-26 DIAGNOSIS — C9000 Multiple myeloma not having achieved remission: Secondary | ICD-10-CM | POA: Diagnosis not present

## 2017-06-26 DIAGNOSIS — Z52011 Autologous donor, stem cells: Secondary | ICD-10-CM | POA: Diagnosis not present

## 2017-06-30 DIAGNOSIS — K5900 Constipation, unspecified: Secondary | ICD-10-CM | POA: Diagnosis not present

## 2017-06-30 DIAGNOSIS — E119 Type 2 diabetes mellitus without complications: Secondary | ICD-10-CM | POA: Diagnosis not present

## 2017-06-30 DIAGNOSIS — C9000 Multiple myeloma not having achieved remission: Secondary | ICD-10-CM | POA: Diagnosis not present

## 2017-06-30 DIAGNOSIS — R5383 Other fatigue: Secondary | ICD-10-CM | POA: Diagnosis not present

## 2017-06-30 DIAGNOSIS — C9001 Multiple myeloma in remission: Secondary | ICD-10-CM | POA: Diagnosis not present

## 2017-06-30 DIAGNOSIS — R51 Headache: Secondary | ICD-10-CM | POA: Diagnosis not present

## 2017-06-30 DIAGNOSIS — G629 Polyneuropathy, unspecified: Secondary | ICD-10-CM | POA: Diagnosis not present

## 2017-06-30 DIAGNOSIS — D696 Thrombocytopenia, unspecified: Secondary | ICD-10-CM | POA: Diagnosis not present

## 2017-06-30 DIAGNOSIS — D6959 Other secondary thrombocytopenia: Secondary | ICD-10-CM | POA: Diagnosis present

## 2017-06-30 DIAGNOSIS — D61818 Other pancytopenia: Secondary | ICD-10-CM | POA: Diagnosis not present

## 2017-06-30 DIAGNOSIS — R110 Nausea: Secondary | ICD-10-CM | POA: Diagnosis not present

## 2017-06-30 DIAGNOSIS — E559 Vitamin D deficiency, unspecified: Secondary | ICD-10-CM | POA: Diagnosis present

## 2017-06-30 DIAGNOSIS — Z9889 Other specified postprocedural states: Secondary | ICD-10-CM | POA: Diagnosis not present

## 2017-06-30 DIAGNOSIS — G893 Neoplasm related pain (acute) (chronic): Secondary | ICD-10-CM | POA: Diagnosis not present

## 2017-06-30 DIAGNOSIS — I739 Peripheral vascular disease, unspecified: Secondary | ICD-10-CM | POA: Diagnosis not present

## 2017-06-30 DIAGNOSIS — R432 Parageusia: Secondary | ICD-10-CM | POA: Diagnosis not present

## 2017-06-30 DIAGNOSIS — R600 Localized edema: Secondary | ICD-10-CM | POA: Diagnosis not present

## 2017-06-30 DIAGNOSIS — M898X9 Other specified disorders of bone, unspecified site: Secondary | ICD-10-CM | POA: Diagnosis not present

## 2017-06-30 DIAGNOSIS — D6481 Anemia due to antineoplastic chemotherapy: Secondary | ICD-10-CM | POA: Diagnosis not present

## 2017-06-30 DIAGNOSIS — Z86718 Personal history of other venous thrombosis and embolism: Secondary | ICD-10-CM | POA: Diagnosis not present

## 2017-06-30 DIAGNOSIS — I10 Essential (primary) hypertension: Secondary | ICD-10-CM | POA: Diagnosis not present

## 2017-06-30 DIAGNOSIS — D61810 Antineoplastic chemotherapy induced pancytopenia: Secondary | ICD-10-CM | POA: Diagnosis not present

## 2017-06-30 DIAGNOSIS — Z452 Encounter for adjustment and management of vascular access device: Secondary | ICD-10-CM | POA: Diagnosis not present

## 2017-06-30 DIAGNOSIS — T451X5A Adverse effect of antineoplastic and immunosuppressive drugs, initial encounter: Secondary | ICD-10-CM | POA: Diagnosis present

## 2017-06-30 DIAGNOSIS — M79606 Pain in leg, unspecified: Secondary | ICD-10-CM | POA: Diagnosis not present

## 2017-06-30 DIAGNOSIS — Z9484 Stem cells transplant status: Secondary | ICD-10-CM | POA: Diagnosis not present

## 2017-06-30 DIAGNOSIS — Z4682 Encounter for fitting and adjustment of non-vascular catheter: Secondary | ICD-10-CM | POA: Diagnosis not present

## 2017-07-01 DIAGNOSIS — Z9484 Stem cells transplant status: Secondary | ICD-10-CM | POA: Insufficient documentation

## 2017-07-11 ENCOUNTER — Telehealth: Payer: Self-pay

## 2017-07-11 ENCOUNTER — Other Ambulatory Visit: Payer: Self-pay

## 2017-07-11 DIAGNOSIS — C9000 Multiple myeloma not having achieved remission: Secondary | ICD-10-CM

## 2017-07-11 NOTE — Telephone Encounter (Signed)
Lab work draw on 07/15/2017 needs to be faxed to [phone]- Dr. Norma Fredrickson at [facility]. Erasmo Downer, RN made aware to fax results on Tuesday.

## 2017-07-11 NOTE — Telephone Encounter (Signed)
Received a call from Urban Gibson, RN at [facility]. Informed that patient should be discharged from the hospital on Sunday and will need f/u labs here at the [facility] on Tuesday 07/15/17. High priority scheduling message sent to add patient for a lab appointment Tuesday. Lab orders placed. Per Wells Guiles, patient to follow up with Dr. Lebron Conners around 08/29/17. Scheduling message sent to add patient for this appointment as well. Dr. Lebron Conners given packet of information received from [facility].

## 2017-07-14 ENCOUNTER — Telehealth: Payer: Self-pay | Admitting: Hematology and Oncology

## 2017-07-14 NOTE — Telephone Encounter (Signed)
Spoke with patients daughter regarding appointment per 1/25 sched msg

## 2017-07-15 ENCOUNTER — Other Ambulatory Visit: Payer: Self-pay

## 2017-07-15 ENCOUNTER — Inpatient Hospital Stay: Payer: Medicare Other | Attending: Hematology and Oncology

## 2017-07-15 DIAGNOSIS — C9000 Multiple myeloma not having achieved remission: Secondary | ICD-10-CM | POA: Diagnosis not present

## 2017-07-15 LAB — CBC WITH DIFFERENTIAL (CANCER CENTER ONLY)
Basophils Absolute: 0.1 10*3/uL (ref 0.0–0.1)
Basophils Relative: 1 %
Eosinophils Absolute: 0 10*3/uL (ref 0.0–0.5)
Eosinophils Relative: 0 %
HEMATOCRIT: 25 % — AB (ref 34.8–46.6)
Hemoglobin: 8.3 g/dL — ABNORMAL LOW (ref 11.6–15.9)
LYMPHS ABS: 1.9 10*3/uL (ref 0.9–3.3)
LYMPHS PCT: 12 %
MCH: 30.9 pg (ref 25.1–34.0)
MCHC: 33 g/dL (ref 31.5–36.0)
MCV: 93.7 fL (ref 79.5–101.0)
MONO ABS: 2.7 10*3/uL — AB (ref 0.1–0.9)
MONOS PCT: 17 %
NEUTROS ABS: 11.4 10*3/uL — AB (ref 1.5–6.5)
Neutrophils Relative %: 70 %
Platelet Count: 105 10*3/uL — ABNORMAL LOW (ref 145–400)
RBC: 2.67 MIL/uL — AB (ref 3.70–5.45)
RDW: 16.6 % — AB (ref 11.2–16.1)
Smear Review: 1
WBC Count: 16.2 10*3/uL — ABNORMAL HIGH (ref 3.9–10.3)

## 2017-07-15 LAB — CMP (CANCER CENTER ONLY)
ALBUMIN: 3.6 g/dL (ref 3.5–5.0)
ALT: 12 U/L (ref 0–55)
AST: 11 U/L (ref 5–34)
Alkaline Phosphatase: 137 U/L (ref 40–150)
Anion gap: 10 (ref 3–11)
BUN: 11 mg/dL (ref 7–26)
CHLORIDE: 107 mmol/L (ref 98–109)
CO2: 23 mmol/L (ref 22–29)
CREATININE: 1.39 mg/dL — AB (ref 0.60–1.10)
Calcium: 9.1 mg/dL (ref 8.4–10.4)
GFR, Est AFR Am: 47 mL/min — ABNORMAL LOW (ref >60)
GFR, Estimated: 41 mL/min — ABNORMAL LOW (ref >60)
GLUCOSE: 126 mg/dL (ref 70–140)
POTASSIUM: 3.4 mmol/L (ref 3.3–4.7)
Sodium: 140 mmol/L (ref 136–145)
Total Bilirubin: 0.2 mg/dL (ref 0.2–1.2)
Total Protein: 6.4 g/dL (ref 6.4–8.3)

## 2017-07-15 LAB — MAGNESIUM: MAGNESIUM: 1.8 mg/dL (ref 1.5–2.5)

## 2017-07-15 LAB — PHOSPHORUS: PHOSPHORUS: 2.7 mg/dL (ref 2.5–4.6)

## 2017-07-17 ENCOUNTER — Inpatient Hospital Stay: Payer: Medicare Other

## 2017-07-17 ENCOUNTER — Other Ambulatory Visit: Payer: Self-pay | Admitting: Hematology and Oncology

## 2017-07-17 VITALS — BP 148/80 | HR 112 | Temp 98.7°F | Resp 18 | Ht 69.5 in | Wt 176.2 lb

## 2017-07-17 DIAGNOSIS — C9000 Multiple myeloma not having achieved remission: Secondary | ICD-10-CM

## 2017-07-17 MED ORDER — SODIUM CHLORIDE 0.9% FLUSH
10.0000 mL | INTRAVENOUS | Status: DC | PRN
  Administered 2017-07-17: 10 mL
  Filled 2017-07-17: qty 10

## 2017-07-17 MED ORDER — SODIUM CHLORIDE 0.9 % IV SOLN
Freq: Once | INTRAVENOUS | Status: AC
  Administered 2017-07-17: 13:00:00 via INTRAVENOUS

## 2017-07-17 MED ORDER — HEPARIN SOD (PORK) LOCK FLUSH 100 UNIT/ML IV SOLN
500.0000 [IU] | Freq: Once | INTRAVENOUS | Status: AC | PRN
  Administered 2017-07-17: 500 [IU]
  Filled 2017-07-17: qty 5

## 2017-07-17 NOTE — Progress Notes (Signed)
RN visit  For IV fluids only

## 2017-07-17 NOTE — Patient Instructions (Signed)
Dehydration, Adult Dehydration is a condition in which there is not enough fluid or water in the body. This happens when you lose more fluids than you take in. Important organs, such as the kidneys, brain, and heart, cannot function without a proper amount of fluids. Any loss of fluids from the body can lead to dehydration. Dehydration can range from mild to severe. This condition should be treated right away to prevent it from becoming severe. What are the causes? This condition may be caused by:  Vomiting.  Diarrhea.  Excessive sweating, such as from heat exposure or exercise.  Not drinking enough fluid, especially: ? When ill. ? While doing activity that requires a lot of energy.  Excessive urination.  Fever.  Infection.  Certain medicines, such as medicines that cause the body to lose excess fluid (diuretics).  Inability to access safe drinking water.  Reduced physical ability to get adequate water and food.  What increases the risk? This condition is more likely to develop in people:  Who have a poorly controlled long-term (chronic) illness, such as diabetes, heart disease, or kidney disease.  Who are age 65 or older.  Who are disabled.  Who live in a place with high altitude.  Who play endurance sports.  What are the signs or symptoms? Symptoms of mild dehydration may include:  Thirst.  Dry lips.  Slightly dry mouth.  Dry, warm skin.  Dizziness. Symptoms of moderate dehydration may include:  Very dry mouth.  Muscle cramps.  Dark urine. Urine may be the color of tea.  Decreased urine production.  Decreased tear production.  Heartbeat that is irregular or faster than normal (palpitations).  Headache.  Light-headedness, especially when you stand up from a sitting position.  Fainting (syncope). Symptoms of severe dehydration may include:  Changes in skin, such as: ? Cold and clammy skin. ? Blotchy (mottled) or pale skin. ? Skin that does  not quickly return to normal after being lightly pinched and released (poor skin turgor).  Changes in body fluids, such as: ? Extreme thirst. ? No tear production. ? Inability to sweat when body temperature is high, such as in hot weather. ? Very little urine production.  Changes in vital signs, such as: ? Weak pulse. ? Pulse that is more than 100 beats a minute when sitting still. ? Rapid breathing. ? Low blood pressure.  Other changes, such as: ? Sunken eyes. ? Cold hands and feet. ? Confusion. ? Lack of energy (lethargy). ? Difficulty waking up from sleep. ? Short-term weight loss. ? Unconsciousness. How is this diagnosed? This condition is diagnosed based on your symptoms and a physical exam. Blood and urine tests may be done to help confirm the diagnosis. How is this treated? Treatment for this condition depends on the severity. Mild or moderate dehydration can often be treated at home. Treatment should be started right away. Do not wait until dehydration becomes severe. Severe dehydration is an emergency and it needs to be treated in a hospital. Treatment for mild dehydration may include:  Drinking more fluids.  Replacing salts and minerals in your blood (electrolytes) that you may have lost. Treatment for moderate dehydration may include:  Drinking an oral rehydration solution (ORS). This is a drink that helps you replace fluids and electrolytes (rehydrate). It can be found at pharmacies and retail stores. Treatment for severe dehydration may include:  Receiving fluids through an IV tube.  Receiving an electrolyte solution through a feeding tube that is passed through your nose   and into your stomach (nasogastric tube, or NG tube).  Correcting any abnormalities in electrolytes.  Treating the underlying cause of dehydration. Follow these instructions at home:  If directed by your health care provider, drink an ORS: ? Make an ORS by following instructions on the  package. ? Start by drinking small amounts, about  cup (120 mL) every 5-10 minutes. ? Slowly increase how much you drink until you have taken the amount recommended by your health care provider.  Drink enough clear fluid to keep your urine clear or pale yellow. If you were told to drink an ORS, finish the ORS first, then start slowly drinking other clear fluids. Drink fluids such as: ? Water. Do not drink only water. Doing that can lead to having too little salt (sodium) in the body (hyponatremia). ? Ice chips. ? Fruit juice that you have added water to (diluted fruit juice). ? Low-calorie sports drinks.  Avoid: ? Alcohol. ? Drinks that contain a lot of sugar. These include high-calorie sports drinks, fruit juice that is not diluted, and soda. ? Caffeine. ? Foods that are greasy or contain a lot of fat or sugar.  Take over-the-counter and prescription medicines only as told by your health care provider.  Do not take sodium tablets. This can lead to having too much sodium in the body (hypernatremia).  Eat foods that contain a healthy balance of electrolytes, such as bananas, oranges, potatoes, tomatoes, and spinach.  Keep all follow-up visits as told by your health care provider. This is important. Contact a health care provider if:  You have abdominal pain that: ? Gets worse. ? Stays in one area (localizes).  You have a rash.  You have a stiff neck.  You are more irritable than usual.  You are sleepier or more difficult to wake up than usual.  You feel weak or dizzy.  You feel very thirsty.  You have urinated only a small amount of very dark urine over 6-8 hours. Get help right away if:  You have symptoms of severe dehydration.  You cannot drink fluids without vomiting.  Your symptoms get worse with treatment.  You have a fever.  You have a severe headache.  You have vomiting or diarrhea that: ? Gets worse. ? Does not go away.  You have blood or green matter  (bile) in your vomit.  You have blood in your stool. This may cause stool to look black and tarry.  You have not urinated in 6-8 hours.  You faint.  Your heart rate while sitting still is over 100 beats a minute.  You have trouble breathing. This information is not intended to replace advice given to you by your health care provider. Make sure you discuss any questions you have with your health care provider. Document Released: 06/03/2005 Document Revised: 12/29/2015 Document Reviewed: 07/28/2015 Elsevier Interactive Patient Education  2018 [employer]  

## 2017-07-21 ENCOUNTER — Other Ambulatory Visit (INDEPENDENT_AMBULATORY_CARE_PROVIDER_SITE_OTHER): Payer: Self-pay | Admitting: Internal Medicine

## 2017-07-22 ENCOUNTER — Other Ambulatory Visit (INDEPENDENT_AMBULATORY_CARE_PROVIDER_SITE_OTHER): Payer: Self-pay | Admitting: *Deleted

## 2017-07-22 DIAGNOSIS — E876 Hypokalemia: Secondary | ICD-10-CM

## 2017-07-30 DIAGNOSIS — C9000 Multiple myeloma not having achieved remission: Secondary | ICD-10-CM | POA: Diagnosis not present

## 2017-07-30 DIAGNOSIS — Z9484 Stem cells transplant status: Secondary | ICD-10-CM | POA: Diagnosis not present

## 2017-07-30 DIAGNOSIS — R630 Anorexia: Secondary | ICD-10-CM | POA: Diagnosis not present

## 2017-07-30 DIAGNOSIS — E559 Vitamin D deficiency, unspecified: Secondary | ICD-10-CM | POA: Diagnosis not present

## 2017-07-30 DIAGNOSIS — Z87891 Personal history of nicotine dependence: Secondary | ICD-10-CM | POA: Diagnosis not present

## 2017-07-30 DIAGNOSIS — E611 Iron deficiency: Secondary | ICD-10-CM | POA: Diagnosis not present

## 2017-08-15 ENCOUNTER — Telehealth: Payer: Self-pay | Admitting: Hematology and Oncology

## 2017-08-15 NOTE — Telephone Encounter (Signed)
Patient called to reschedule  °

## 2017-08-18 ENCOUNTER — Telehealth (INDEPENDENT_AMBULATORY_CARE_PROVIDER_SITE_OTHER): Payer: Self-pay | Admitting: *Deleted

## 2017-08-18 ENCOUNTER — Ambulatory Visit: Payer: Medicare Other | Admitting: Hematology and Oncology

## 2017-08-18 NOTE — Telephone Encounter (Signed)
Note that on 08/12/2017 Dr.Rehman approved and faxed for the following medication- Klor-Con M20 Tab 20 MEQ ER - Take 1 tablet by mouth every day. #30 with 2 additional refills

## 2017-08-22 ENCOUNTER — Inpatient Hospital Stay: Payer: Medicare Other | Attending: Hematology and Oncology | Admitting: Hematology and Oncology

## 2017-08-22 ENCOUNTER — Inpatient Hospital Stay: Payer: Medicare Other

## 2017-08-22 ENCOUNTER — Encounter: Payer: Self-pay | Admitting: Hematology and Oncology

## 2017-08-22 ENCOUNTER — Telehealth: Payer: Self-pay | Admitting: Hematology and Oncology

## 2017-08-22 VITALS — BP 165/86 | HR 103 | Temp 98.4°F | Resp 16 | Ht 69.5 in | Wt 182.6 lb

## 2017-08-22 DIAGNOSIS — C9001 Multiple myeloma in remission: Secondary | ICD-10-CM | POA: Insufficient documentation

## 2017-08-22 DIAGNOSIS — Z9484 Stem cells transplant status: Secondary | ICD-10-CM | POA: Diagnosis not present

## 2017-08-22 DIAGNOSIS — Z9221 Personal history of antineoplastic chemotherapy: Secondary | ICD-10-CM | POA: Diagnosis not present

## 2017-08-22 LAB — PHOSPHORUS: Phosphorus: 4.4 mg/dL (ref 2.5–4.6)

## 2017-08-22 LAB — CMP (CANCER CENTER ONLY)
ALBUMIN: 3.6 g/dL (ref 3.5–5.0)
ALK PHOS: 67 U/L (ref 40–150)
ALT: 9 U/L (ref 0–55)
AST: 11 U/L (ref 5–34)
Anion gap: 8 (ref 3–11)
BUN: 7 mg/dL (ref 7–26)
CO2: 25 mmol/L (ref 22–29)
CREATININE: 0.78 mg/dL (ref 0.60–1.10)
Calcium: 9.3 mg/dL (ref 8.4–10.4)
Chloride: 110 mmol/L — ABNORMAL HIGH (ref 98–109)
GFR, Est AFR Am: >60 mL/min (ref >60)
GFR, Estimated: >60 mL/min (ref >60)
GLUCOSE: 95 mg/dL (ref 70–140)
Potassium: 4.1 mmol/L (ref 3.5–5.1)
SODIUM: 143 mmol/L (ref 136–145)
Total Bilirubin: 0.3 mg/dL (ref 0.2–1.2)
Total Protein: 6.2 g/dL — ABNORMAL LOW (ref 6.4–8.3)

## 2017-08-22 LAB — CBC WITH DIFFERENTIAL (CANCER CENTER ONLY)
BASOS PCT: 1 %
Basophils Absolute: 0.1 10*3/uL (ref 0.0–0.1)
EOS ABS: 0.2 10*3/uL (ref 0.0–0.5)
Eosinophils Relative: 3 %
HCT: 29.6 % — ABNORMAL LOW (ref 34.8–46.6)
Hemoglobin: 9.8 g/dL — ABNORMAL LOW (ref 11.6–15.9)
Lymphocytes Relative: 28 %
Lymphs Abs: 1.5 10*3/uL (ref 0.9–3.3)
MCH: 32.5 pg (ref 25.1–34.0)
MCHC: 33.1 g/dL (ref 31.5–36.0)
MCV: 98 fL (ref 79.5–101.0)
MONO ABS: 0.4 10*3/uL (ref 0.1–0.9)
MONOS PCT: 6 %
Neutro Abs: 3.5 10*3/uL (ref 1.5–6.5)
Neutrophils Relative %: 62 %
Platelet Count: 266 10*3/uL (ref 145–400)
RBC: 3.02 MIL/uL — ABNORMAL LOW (ref 3.70–5.45)
RDW: 15.1 % — AB (ref 11.2–14.5)
WBC Count: 5.6 10*3/uL (ref 3.9–10.3)

## 2017-08-22 LAB — MAGNESIUM: Magnesium: 2 mg/dL (ref 1.5–2.5)

## 2017-08-22 NOTE — Telephone Encounter (Signed)
Appointments scheduled AVS/Calendar printed per 3/8 los

## 2017-08-29 ENCOUNTER — Ambulatory Visit: Payer: Medicare Other

## 2017-09-01 NOTE — Progress Notes (Signed)
[name]:  Assessment: Multiple myeloma in remission [name] who initially presented with significant weight loss in the context of H. pylori-associated gastritis and alcohol abuse. At the same time, an incidental discovery of a lower thoracic vertebral mass in the context of chronic numbness and weakness in the lower extremities. MRI of the thoracic spine demonstrated a destructive lesion at T8 level and patient underwent surgical treatment. 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 Staging evaluation conducted by our clinic confirmed presence of IgG kappa multiple myeloma. Cytogenetics studies demonstrated normal karyotype, FISH studies are positive for 13q34 & D13S319 loss and no loss of p53. Based on initial information, patient is now staged at [place] [facility].   Patient has completed 6 cycles of induction systemic chemotherapy with lenalidomide, bortezomib, and local-dose dexamethasone.  Repeat bone marrow biopsy demonstrated excellent response to treatment and patient subsequently underwent consolidation therapy including flow and conditioning and autologous stem cell rescue.  Presently, she is recovering from the procedure which was conducted at [place]: --At this time, continue observation and patient's recovery. --Post transplant care will be guided by [facility]: Lambert Rhonda W: Will start zoledronic acid monthly with planned treatment of 2-year duration in 1 week. - Lab work today and monthly -Maintenance therapy at the discretion of the [facility] will be started in May. -Return to our clinic in 2 months for possible initiation of maintenance therapy.  Voice recognition software was used and creation of this note. Despite my best effort at editing the text, some misspelling/errors may have occurred.  Orders Placed This Encounter  Procedures  . CBC with Differential (Cancer Center Only)   Standing Status:   Future    Number of Occurrences:   1    Standing Expiration Date:   08/23/2018  . CMP ([place] only)    Standing Status:   Future    Number of Occurrences:   1    Standing Expiration Date:   08/23/2018  . Magnesium    Standing Status:   Future    Number of Occurrences:   1    Standing Expiration Date:   08/22/2018  . Phosphorus    Standing Status:   Future    Number of Occurrences:   1    Standing Expiration Date:   08/22/2018  . CBC with Differential (Cancer Center Only)    Standing Status:   Future    Standing Expiration Date:   08/23/2018  . CMP ([place] only)    Standing Status:   Future    Standing Expiration Date:   08/23/2018  . Magnesium    Standing Status:   Future    Standing Expiration Date:   08/22/2018  . Phosphorus    Standing Status:   Future    Standing Expiration Date:   08/22/2018    Cancer Staging Multiple myeloma not having achieved remission ([name]) Staging form: Plasma Cell Myeloma and Plasma Cell Disorders, AJCC 8th Edition - Clinical stage from 01/24/2017: RISS Stage II (Beta-2-microglobulin (mg/L): 2.3, Albumin (g/dL): 2.9, ISS: Stage II, High-risk cytogenetics: Absent, LDH: Normal) - Signed by [name], MD on 02/21/2017   All questions were answered.  . The patient knows to call the clinic with any problems, questions or concerns.  This note was electronically signed.    History of Presenting Illness Christine Garrett is a  62 y.o. female followed in the [name] for diagnosis of active  IgG kappa multiple myeloma. Please see oncologic history below for details.  Patient initially presented with epigastric abdominal pain that started in Apr 2018. Patient was previously self medicating with Aleve and Naprosyn or chronic pains. She did not have any melena or hematochezia. Due to the intensity of the pain, patient underwent EGD on 11/29/16 that demonstrates presence of H. pylori-associated gastritis. At the time of  presentation, family reported weight loss of 60 pounds over 3-4 month period. Previously, patient has been habitually drinking alcohol up to 6 beverages per day, quit drinking mid-May 2018. Continued to have abdominal pain and underwent imaging assessment with CT of the abdomen and pelvis on 12/16/16 did demonstrate presence of a mass lesion with spinal cord compression at level of T8. Compression was relieved surgically. She has been recovering well from surgery.  Returns to the clinic after undergoing high-dose chemotherapy with melphalan followed by autologous stem cell rescue conducted at [facility] on 07/01/17.  Patient appears to be recovering well from her treatment.  Denies any interval infections.  Denies any interval fevers, chills, night sweats.  No new neuropathy, skeletal pain, nausea, vomiting, abdominal pain, diarrhea, or constipation.  Oncological/hematological History: --Labs, 11/22/16: Ca 9.5, Cr 0.86; WBC 5.6, Hgb 12.2, Plt 270;  --EGD, 11/29/16: Gastritis positive for H. pylori. Treated with omeprazole, clarithromycin, and amoxicillin x14d  --CT A/P, 12/16/16: Left T8 lytic lesion with soft tissue extension into the spinal canal with cord compression. Fatty liver measuring 19.6cm. 0.3cm lt lower pole renal calculus, no hydronephrosis;  --MRI T-spine, 12/17/16: An enhancing mass on the left side at T8 invading the canal and compressing the spinal cord.    Multiple myeloma in remission ([place])   12/26/2016 Initial Diagnosis    Multiple myeloma not having achieved remission [facility]): Originally presented with a mass resulting in compression of the spinal cord. Underwent surgery on 12/26/16 for decompression. --Initial Surgical Pathology: Plasma cell neoplasm involvement, IHC -- positive for CD138, CD79a, CD56, LCA, CD43, kappa light chains & negative for CD20, lambda light chains; --Baseline labs, 12/30/16: tProt 7.4, Alb 2.9, Ca 9.1, Cr 0.96, AP 77; SPEP/SIFE -- M-Spike 1.0g/dL,  monoclonal IgG kappa present; IgA 51, IgG 1477, IgM 17; kappa 77.6, lambda 7.4, KLR 10.35; LDH 161, beta-2 microglobulin 2.3; WBC 10.8, Hgb 11.2, Plt 298;      01/16/2017 Imaging    PET-CT: Positive pathological uptake in T9 and proximal right tibia. No other hypermetabolic disease      11/22/5447 Pathology Results    Bone Marrow Biopsy: Hypercellular bone marrow with 16% involvement by plasma cell process FISH: positive for [id number] loss, 13q34 loss, negative for del p53      01/30/2017 -  Chemotherapy    RVd: Lenalidomide 11m PO QDay, d1-14 + bortezomib 1.333mm2 d1,8,15 + low-dose dexamethasone 4038mO QWk Q21d --Cycle #1, 01/30/17: Complete located by grade 1 nausea --Cycle #2, 03/07/96/58omplicated by grade 2 nausea, grade 1 fatigue --Cycle #3, 09/83/25/49omplicated by progressive LE weakness --Cycle #4, 04/03/17: Bortezomib dose reduced to 1mg12m --Cycle #5, 04/24/17: Bortezomib dose reduced to 1mg/38m complicated by watery diarrhea and nausea, grade 2 --Cycle #6, 05/22/17: delayed due to complications of the previous cycle       04/15/2017 Imaging    PET-CT: No residual hypermetabolic activity in the skeletal structures.      04/21/2017 Pathology Results    BM Bx: --Mildly hypercellular bone marrow with 2% plasma cells with polyclonal appearance. --Flow Cyto: No evidence of monoclonal plasma cell or lymphocyte  populations. --CytoGen: 46,XX, no clonal chromosomal abnormalities      07/01/2017 Bone Marrow Transplant    Autologous peripheral blood stem cell transplant: --Conditioning: Melphalan --Recommended post-Tx f/u:    --Day 30: 07/30/17 -- labs & f/u    --Day 100: 10/13/17 -- Repeat imaging and labs, maintenance therapy planning    --Day 180: 12/29/17 -- ?BM Bx     --Day 270: 03/23/18 -- ?BM Bx    --Day 360: 06/29/18 -- ?BM Bx --Recommended Immunization schedules:    --453Mo: HepB, PCV13, DTaP, HIB, IPV    --53Mo: HepB, PCV13, DTaP, HIB, IPV    --23Mo: PCV13, DTaP, HIB,  IPV    --96Mo: HepB, PPSV23    --30+ Mo: MMR        Medical History: Past Medical History:  Diagnosis Date  . Arthritis    knees  . Cancer ([place])   . Carpal tunnel syndrome   . CHF (congestive heart failure) ([place])   . Diabetes mellitus    type 2  . GERD (gastroesophageal reflux disease)   . Heart murmur    "Little, No concerns" per Dr  Dannielle Burn pt reported.  . Hypertension    controlled using a guided approch with plasma renin activity  . Multiple myeloma not having achieved remission ([place]) 01/06/2017  . Neuropathy   . Peripheral vascular disease ([place])   . Sleep apnea    Uses CPAP    Surgical History: Past Surgical History:  Procedure Laterality Date  . BIOPSY  11/28/2016   Procedure: BIOPSY;  Surgeon: Rogene Houston, MD;  [facility];  Service: Endoscopy;;  gastric  . [place]  . CERVICAL CONE BIOPSY     cervical lesion  . ESOPHAGOGASTRODUODENOSCOPY (EGD) WITH PROPOFOL N/A 11/28/2016   Procedure: ESOPHAGOGASTRODUODENOSCOPY (EGD) WITH PROPOFOL;  Surgeon: Rogene Houston, MD;  [facility];  Service: Endoscopy;  Laterality: N/A;  9:25  . IR FLUORO GUIDE PORT INSERTION RIGHT  01/29/2017  . IR US GUIDE VASC ACCESS RIGHT  01/29/2017  . lipoma removal     right shoulder 2001  . MULTIPLE EXTRACTIONS WITH ALVEOLOPLASTY Bilateral 08/24/2012   Procedure: MULTIPLE EXTRACTION WITH ALVEOLOPLASTY BIOPSY OF RIGHT AND LEFT MANDIBLE ;  Surgeon: Gae Bon, DDS;  Location: [place];  Service: Oral Surgery;  Laterality: Bilateral;  . POSTERIOR LUMBAR FUSION 4 LEVEL N/A 12/26/2016   Procedure: THORACIC EIGHT TUMOR RESECTION, THORACIC SIX- THORACIC TEN POSTERIOR SPINAL FUSION;  Surgeon: Ashok Pall, MD;  Location: [place];  Service: Neurosurgery;  Laterality: N/A;  THORACIC 8 TUMOR RESECTION, THORACIC 6- THORACIC 10 POSTERIOR SPINAL FUSION  . ROTATOR CUFF REPAIR     right shoulder  . TUBAL LIGATION      Family History: Family History  Problem Relation Age of  Onset  . Diabetes Mother   . Hypertension Mother   . Heart disease Mother   . Diabetes Father   . Heart disease Sister   . Diabetes Sister     Social History: Social History   Socioeconomic History  . Marital status: Single    Spouse name: Not on file  . Number of children: Not on file  . Years of education: Not on file  . Highest education level: Not on file  Social Needs  . Financial resource strain: Not on file  . Food insecurity - worry: Not on file  . Food insecurity - inability: Not on file  . Transportation needs - medical: Not on  file  . Transportation needs - non-medical: Not on file  Occupational History  . Not on file  Tobacco Use  . Smoking status: Former Smoker    Packs/day: 0.50    Years: 6.00    Pack years: 3.00    Types: Cigarettes    Last attempt to quit: 11/23/2011    Years since quitting: 5.7  . Smokeless tobacco: Never Used  . Tobacco comment: quit summer 2013  Substance and Sexual Activity  . Alcohol use: No    Alcohol/week: 3.6 oz    Types: 6 Cans of beer per week  . Drug use: No  . Sexual activity: Not Currently    Birth control/protection: Post-menopausal  Other Topics Concern  . Not on file  Social History Narrative  . Not on file    Allergies: Allergies  Allergen Reactions  . No Known Allergies     Medications:  Current Outpatient Medications  Medication Sig Dispense Refill  . acyclovir (ZOVIRAX) 400 MG tablet Take 1 tablet (400 mg total) by mouth 2 (two) times daily. 60 tablet 0  . chlorthalidone (HYGROTON) 25 MG tablet Take 25 mg by mouth 2 (two) times daily.   11  . cholecalciferol (VITAMIN D) 1000 units tablet Take 1,000 Units by mouth daily.    Marland Kitchen diltiazem (CARDIZEM CD) 300 MG 24 hr capsule Take 353ms by mouth daily  3  . enalapril (VASOTEC) 20 MG tablet Take 20 mg by mouth 2 (two) times daily.     . folic acid (FOLVITE) 1 MG tablet Take 1 mg by mouth daily.    . insulin degludec (TRESIBA FLEXTOUCH) 100 UNIT/ML SOPN  FlexTouch Pen Inject 0.5 mLs (50 Units total) into the skin daily at 10 pm. 5 pen 2  . Insulin Pen Needle (B-D ULTRAFINE III SHORT PEN) 31G X 8 MM MISC 1 each by Does not apply route as directed. 100 each 3  . sulfamethoxazole-trimethoprim (BACTRIM DS,SEPTRA DS) 800-160 MG tablet Take 1 tablet by mouth daily.    .Marland Kitchendexamethasone (DECADRON) 4 MG tablet Take 10 tablets (40 mg) on days 1, 8, and 15 of chemo. Repeat every 21 days. (Patient not taking: Reported on 08/22/2017) 30 tablet 3  . gabapentin (NEURONTIN) 300 MG capsule Take 1 capsule (300 mg total) by mouth 3 (three) times daily. 63 capsule 3  . KLOR-CON M20 20 MEQ tablet TAKE 1 TABLET BY MOUTH EVERY DAY 30 tablet 2  . lenalidomide (REVLIMID) 25 MG capsule Take one capsule (24m by mouth once daily on days 1-14 every 21 days. Auth# [id number] (Patient not taking: Reported on 08/22/2017) 14 capsule 0  . lidocaine-prilocaine (EMLA) cream Apply to affected area once 30 g 3  . LORazepam (ATIVAN) 0.5 MG tablet Take 1 tablet (0.5 mg total) by mouth every 6 (six) hours as needed (Nausea or vomiting). (Patient not taking: Reported on 08/22/2017) 30 tablet 0  . metFORMIN (GLUCOPHAGE) 1000 MG tablet Take 1,000 mg by mouth 2 (two) times daily.     . metoCLOPramide (REGLAN) 10 MG tablet Take 1 tablet (10 mg total) every 6 (six) hours by mouth. 30 tablet 0  . nicotine (NICODERM CQ - DOSED IN MG/24 HOURS) 14 mg/24hr patch Place 1 patch (14 mg total) onto the skin daily. (Patient not taking: Reported on 05/01/2017) 28 patch 0  . nicotine polacrilex (COMMIT) 2 MG lozenge Take 1 lozenge (2 mg total) by mouth as needed for smoking cessation. (Patient not taking: Reported on 05/01/2017) 100 tablet 0  .  omeprazole (PRILOSEC) 20 MG capsule Take 1 capsule (20 mg total) by mouth 2 (two) times daily before a meal. 60 capsule 0  . ondansetron (ZOFRAN) 8 MG tablet Take 1 tablet (8 mg total) by mouth 2 (two) times daily as needed (Nausea or vomiting). (Patient not taking:  Reported on 08/22/2017) 30 tablet 1  . oxyCODONE (OXY IR/ROXICODONE) 5 MG immediate release tablet Take 1 tablet (5 mg total) by mouth every 4 (four) hours as needed for severe pain. (Patient not taking: Reported on 06/02/2017) 30 tablet 0  . prochlorperazine (COMPAZINE) 10 MG tablet Take 1 tablet (10 mg total) by mouth every 6 (six) hours as needed (Nausea or vomiting). 30 tablet 1  . rivaroxaban (XARELTO) 10 MG TABS tablet Take 1 tablet (10 mg total) by mouth daily. (Patient not taking: Reported on 08/22/2017) 30 tablet 0  . tiZANidine (ZANAFLEX) 4 MG tablet Take 1 tablet (4 mg total) by mouth every 6 (six) hours as needed for muscle spasms. 60 tablet 0   No current facility-administered medications for this visit.     Review of Systems: Review of Systems  Constitutional: Positive for fatigue. Negative for appetite change.  Gastrointestinal: Negative for diarrhea and nausea.  All other systems reviewed and are negative.    PHYSICAL EXAMINATION Blood pressure (!) 165/86, pulse (!) 103, temperature 98.4 F (36.9 C), temperature source Oral, resp. rate 16, height 5' 9.5" (1.765 m), weight 182 lb 9.6 oz (82.8 kg), SpO2 100 %.  ECOG PERFORMANCE STATUS: 2 - Symptomatic, <50% confined to bed  Physical Exam  Constitutional: She is oriented to person, place, and time and well-developed, well-nourished, and in no distress.  HENT:  Head: Normocephalic and atraumatic.  Mouth/Throat: Oropharynx is clear and moist. No oropharyngeal exudate.  Eyes: EOM are normal. Pupils are equal, round, and reactive to light. No scleral icterus.  Neck: No thyromegaly present.  Cardiovascular: Normal rate, regular rhythm and normal heart sounds. Exam reveals no gallop.  No murmur heard. Pulmonary/Chest: Effort normal and breath sounds normal. She has no wheezes.  Abdominal: Soft. Bowel sounds are normal. She exhibits no distension and no mass. There is no tenderness. There is no rebound.  Musculoskeletal: Normal  range of motion. She exhibits no edema.       Right shoulder: She exhibits no tenderness, no bony tenderness and no swelling.  Neurological: She is alert and oriented to person, place, and time.  Patient ambulates with difficulty and uses a walker.     LABORATORY DATA: I have personally reviewed the data as listed: Appointment on 08/22/2017  Component Date Value Ref Range Status  . Phosphorus 08/22/2017 4.4  2.5 - 4.6 mg/dL Final   Performed at [facility] [address], [place], [place] [address]  . Magnesium 08/22/2017 2.0  1.5 - 2.5 mg/dL Final   Performed at [facility], [place] [address], [place], [place] [address]  . Sodium 08/22/2017 143  136 - 145 mmol/L Final  . Potassium 08/22/2017 4.1  3.5 - 5.1 mmol/L Final  . Chloride 08/22/2017 110* 98 - 109 mmol/L Final  . CO2 08/22/2017 25  22 - 29 mmol/L Final  . Glucose, Bld 08/22/2017 95  70 - 140 mg/dL Final  . BUN 08/22/2017 7  7 - 26 mg/dL Final  . Creatinine 08/22/2017 0.78  0.60 - 1.10 mg/dL Final  . Calcium 08/22/2017 9.3  8.4 - 10.4 mg/dL Final  . Total Protein 08/22/2017 6.2* 6.4 - 8.3 g/dL Final  .  Albumin 08/22/2017 3.6  3.5 - 5.0 g/dL Final  . AST 08/22/2017 11  5 - 34 U/L Final  . ALT 08/22/2017 9  0 - 55 U/L Final  . Alkaline Phosphatase 08/22/2017 67  40 - 150 U/L Final  . Total Bilirubin 08/22/2017 0.3  0.2 - 1.2 mg/dL Final  . GFR, Est Non Af Am 08/22/2017 >60  >60 mL/min Final  . GFR, Est AFR Am 08/22/2017 >60  >60 mL/min Final   Comment: (NOTE) The eGFR has been calculated using the CKD EPI equation. This calculation has not been validated in all clinical situations. eGFR's persistently <60 mL/min signify possible Chronic Kidney Disease.   Georgiann Hahn gap 08/22/2017 8  3 - 11 Final   Performed at [facility], [place] [address], [place], [place] [address]  . WBC Count 08/22/2017 5.6  3.9 - 10.3 K/uL Final  . RBC 08/22/2017 3.02* 3.70 - 5.45  MIL/uL Final  . Hemoglobin 08/22/2017 9.8* 11.6 - 15.9 g/dL Final  . HCT 08/22/2017 29.6* 34.8 - 46.6 % Final  . MCV 08/22/2017 98.0  79.5 - 101.0 fL Final  . MCH 08/22/2017 32.5  25.1 - 34.0 pg Final  . MCHC 08/22/2017 33.1  31.5 - 36.0 g/dL Final  . RDW 08/22/2017 15.1* 11.2 - 14.5 % Final  . Platelet Count 08/22/2017 266  145 - 400 K/uL Final  . Neutrophils Relative % 08/22/2017 62  % Final  . Neutro Abs 08/22/2017 3.5  1.5 - 6.5 K/uL Final  . Lymphocytes Relative 08/22/2017 28  % Final  . Lymphs Abs 08/22/2017 1.5  0.9 - 3.3 K/uL Final  . Monocytes Relative 08/22/2017 6  % Final  . Monocytes Absolute 08/22/2017 0.4  0.1 - 0.9 K/uL Final  . Eosinophils Relative 08/22/2017 3  % Final  . Eosinophils Absolute 08/22/2017 0.2  0.0 - 0.5 K/uL Final  . Basophils Relative 08/22/2017 1  % Final  . Basophils Absolute 08/22/2017 0.1  0.0 - 0.1 K/uL Final   Performed at [facility], [place] [address], [place], [place] [address]       [name], MD

## 2017-09-02 NOTE — Assessment & Plan Note (Signed)
62 y.o. female who initially presented with significant weight loss in the context of H. pylori-associated gastritis and alcohol abuse. At the same time, an incidental discovery of a lower thoracic vertebral mass in the context of chronic numbness and weakness in the lower extremities. MRI of the thoracic spine demonstrated a destructive lesion at T8 level and patient underwent surgical treatment. 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 Staging evaluation conducted by our clinic confirmed presence of IgG kappa multiple myeloma. Cytogenetics studies demonstrated normal karyotype, FISH studies are positive for 13q34 & D13S319 loss and no loss of p53. Based on initial information, patient is now staged at [place] [facility].   Patient has completed 6 cycles of induction systemic chemotherapy with lenalidomide, bortezomib, and local-dose dexamethasone.  Repeat bone marrow biopsy demonstrated excellent response to treatment and patient subsequently underwent consolidation therapy including flow and conditioning and autologous stem cell rescue.  Presently, she is recovering from the procedure which was conducted at [place]: --At this time, continue observation and patient's recovery. --Post transplant care will be guided by [facility]: Will start zoledronic acid monthly with planned treatment of 2-year duration in 1 week. - Lab work today and monthly -Maintenance therapy at the discretion of the [facility] will be started in May. -Return to our clinic in 2 months for possible initiation of maintenance therapy.

## 2017-09-04 DIAGNOSIS — E0849 Diabetes mellitus due to underlying condition with other diabetic neurological complication: Secondary | ICD-10-CM | POA: Diagnosis not present

## 2017-09-04 DIAGNOSIS — E7849 Other hyperlipidemia: Secondary | ICD-10-CM | POA: Diagnosis not present

## 2017-09-04 DIAGNOSIS — J449 Chronic obstructive pulmonary disease, unspecified: Secondary | ICD-10-CM | POA: Diagnosis not present

## 2017-09-04 DIAGNOSIS — C9001 Multiple myeloma in remission: Secondary | ICD-10-CM | POA: Diagnosis not present

## 2017-09-04 DIAGNOSIS — E0865 Diabetes mellitus due to underlying condition with hyperglycemia: Secondary | ICD-10-CM | POA: Diagnosis not present

## 2017-09-04 DIAGNOSIS — I10 Essential (primary) hypertension: Secondary | ICD-10-CM | POA: Diagnosis not present

## 2017-09-04 DIAGNOSIS — J441 Chronic obstructive pulmonary disease with (acute) exacerbation: Secondary | ICD-10-CM | POA: Diagnosis not present

## 2017-09-04 DIAGNOSIS — I82591 Chronic embolism and thrombosis of other specified deep vein of right lower extremity: Secondary | ICD-10-CM | POA: Diagnosis not present

## 2017-09-04 DIAGNOSIS — E1149 Type 2 diabetes mellitus with other diabetic neurological complication: Secondary | ICD-10-CM | POA: Diagnosis not present

## 2017-09-05 ENCOUNTER — Inpatient Hospital Stay: Payer: Medicare Other

## 2017-09-05 VITALS — BP 144/86 | HR 91 | Temp 99.6°F | Resp 17

## 2017-09-05 DIAGNOSIS — Z9484 Stem cells transplant status: Secondary | ICD-10-CM | POA: Diagnosis not present

## 2017-09-05 DIAGNOSIS — C9001 Multiple myeloma in remission: Secondary | ICD-10-CM

## 2017-09-05 DIAGNOSIS — Z9221 Personal history of antineoplastic chemotherapy: Secondary | ICD-10-CM | POA: Diagnosis not present

## 2017-09-05 MED ORDER — SODIUM CHLORIDE 0.9 % IV SOLN
Freq: Once | INTRAVENOUS | Status: AC
  Administered 2017-09-05: 14:00:00 via INTRAVENOUS

## 2017-09-05 MED ORDER — SODIUM CHLORIDE 0.9% FLUSH
10.0000 mL | INTRAVENOUS | Status: DC | PRN
  Administered 2017-09-05: 10 mL via INTRAVENOUS
  Filled 2017-09-05: qty 10

## 2017-09-05 MED ORDER — HEPARIN SOD (PORK) LOCK FLUSH 100 UNIT/ML IV SOLN
500.0000 [IU] | Freq: Once | INTRAVENOUS | Status: AC | PRN
  Administered 2017-09-05: 500 [IU] via INTRAVENOUS
  Filled 2017-09-05: qty 5

## 2017-09-05 MED ORDER — ZOLEDRONIC ACID 4 MG/100ML IV SOLN
4.0000 mg | Freq: Once | INTRAVENOUS | Status: AC
  Administered 2017-09-05: 4 mg via INTRAVENOUS
  Filled 2017-09-05: qty 100

## 2017-09-05 NOTE — Patient Instructions (Addendum)
[facility] Discharge Instructions for Patients Receiving Chemotherapy  Today you received the following chemotherapy agents: Zometa (zoledronic acid)  To help prevent nausea and vomiting after your treatment, we encourage you to take your nausea medication as directed.   If you develop nausea and vomiting that is not controlled by your nausea medication, call the clinic.   BELOW ARE SYMPTOMS THAT SHOULD BE REPORTED IMMEDIATELY:  *FEVER GREATER THAN 100.5 F  *CHILLS WITH OR WITHOUT FEVER  NAUSEA AND VOMITING THAT IS NOT CONTROLLED WITH YOUR NAUSEA MEDICATION  *UNUSUAL SHORTNESS OF BREATH  *UNUSUAL BRUISING OR BLEEDING  TENDERNESS IN MOUTH AND THROAT WITH OR WITHOUT PRESENCE OF ULCERS  *URINARY PROBLEMS  *BOWEL PROBLEMS  UNUSUAL RASH Items with * indicate a potential emergency and should be followed up as soon as possible.  Feel free to call the clinic should you have any questions or concerns. The clinic phone number is [phone].  Please show the CHEMO ALERT CARD at check-in to the Emergency Department and triage nurse.   

## 2017-09-22 ENCOUNTER — Inpatient Hospital Stay: Payer: Medicare Other | Attending: Hematology and Oncology

## 2017-09-22 DIAGNOSIS — C9001 Multiple myeloma in remission: Secondary | ICD-10-CM | POA: Diagnosis not present

## 2017-09-22 LAB — CBC WITH DIFFERENTIAL (CANCER CENTER ONLY)
BASOS ABS: 0.1 10*3/uL (ref 0.0–0.1)
BASOS PCT: 1 %
EOS ABS: 0.1 10*3/uL (ref 0.0–0.5)
Eosinophils Relative: 2 %
HCT: 36.8 % (ref 34.8–46.6)
HEMOGLOBIN: 12.5 g/dL (ref 11.6–15.9)
Lymphocytes Relative: 26 %
Lymphs Abs: 1.1 10*3/uL (ref 0.9–3.3)
MCH: 32.8 pg (ref 25.1–34.0)
MCHC: 33.9 g/dL (ref 31.5–36.0)
MCV: 96.8 fL (ref 79.5–101.0)
MONOS PCT: 8 %
Monocytes Absolute: 0.3 10*3/uL (ref 0.1–0.9)
NEUTROS PCT: 63 %
Neutro Abs: 2.7 10*3/uL (ref 1.5–6.5)
Platelet Count: 318 10*3/uL (ref 145–400)
RBC: 3.8 MIL/uL (ref 3.70–5.45)
RDW: 14.1 % (ref 11.2–14.5)
WBC Count: 4.3 10*3/uL (ref 3.9–10.3)

## 2017-09-22 LAB — CMP (CANCER CENTER ONLY)
ALBUMIN: 4.1 g/dL (ref 3.5–5.0)
ALT: 8 U/L (ref 0–55)
ANION GAP: 11 (ref 3–11)
AST: 14 U/L (ref 5–34)
Alkaline Phosphatase: 60 U/L (ref 40–150)
BUN: 9 mg/dL (ref 7–26)
CO2: 27 mmol/L (ref 22–29)
Calcium: 10.2 mg/dL (ref 8.4–10.4)
Chloride: 100 mmol/L (ref 98–109)
Creatinine: 0.89 mg/dL (ref 0.60–1.10)
GFR, Est AFR Am: >60 mL/min (ref >60)
GFR, Estimated: >60 mL/min (ref >60)
GLUCOSE: 100 mg/dL (ref 70–140)
POTASSIUM: 3.3 mmol/L — AB (ref 3.5–5.1)
SODIUM: 138 mmol/L (ref 136–145)
Total Bilirubin: 0.5 mg/dL (ref 0.2–1.2)
Total Protein: 6.9 g/dL (ref 6.4–8.3)

## 2017-09-22 LAB — MAGNESIUM: Magnesium: 2.1 mg/dL (ref 1.5–2.5)

## 2017-09-22 LAB — PHOSPHORUS: Phosphorus: 3.8 mg/dL (ref 2.5–4.6)

## 2017-10-13 DIAGNOSIS — C9000 Multiple myeloma not having achieved remission: Secondary | ICD-10-CM | POA: Diagnosis not present

## 2017-10-13 DIAGNOSIS — Z9484 Stem cells transplant status: Secondary | ICD-10-CM | POA: Diagnosis not present

## 2017-10-20 DIAGNOSIS — Z7983 Long term (current) use of bisphosphonates: Secondary | ICD-10-CM | POA: Diagnosis not present

## 2017-10-20 DIAGNOSIS — E559 Vitamin D deficiency, unspecified: Secondary | ICD-10-CM | POA: Diagnosis not present

## 2017-10-20 DIAGNOSIS — C9001 Multiple myeloma in remission: Secondary | ICD-10-CM | POA: Diagnosis not present

## 2017-10-20 DIAGNOSIS — C9000 Multiple myeloma not having achieved remission: Secondary | ICD-10-CM | POA: Diagnosis not present

## 2017-10-20 DIAGNOSIS — Z9484 Stem cells transplant status: Secondary | ICD-10-CM | POA: Diagnosis not present

## 2017-10-20 DIAGNOSIS — G629 Polyneuropathy, unspecified: Secondary | ICD-10-CM | POA: Diagnosis not present

## 2017-10-22 ENCOUNTER — Encounter: Payer: Self-pay | Admitting: Hematology and Oncology

## 2017-10-22 ENCOUNTER — Telehealth: Payer: Self-pay | Admitting: Hematology and Oncology

## 2017-10-22 ENCOUNTER — Inpatient Hospital Stay: Payer: Medicare Other | Attending: Hematology and Oncology | Admitting: Hematology and Oncology

## 2017-10-22 VITALS — BP 124/83 | HR 91 | Temp 99.0°F | Resp 18 | Ht 69.5 in | Wt 181.1 lb

## 2017-10-22 DIAGNOSIS — C9001 Multiple myeloma in remission: Secondary | ICD-10-CM

## 2017-10-22 MED ORDER — ASPIRIN EC 81 MG PO TBEC: 162.0000 mg | DELAYED_RELEASE_TABLET | Freq: Every day | ORAL | 2 refills | Status: AC

## 2017-10-22 MED ORDER — LENALIDOMIDE 5 MG PO CAPS: 5.0000 mg | ORAL_CAPSULE | Freq: Every day | ORAL | 3 refills | Status: AC

## 2017-10-22 MED ORDER — LENALIDOMIDE 5 MG PO CAPS: 5.0000 mg | ORAL_CAPSULE | Freq: Every day | ORAL | 3 refills | Status: DC

## 2017-10-22 NOTE — Telephone Encounter (Signed)
Appointments scheduled AVS/Calendar printed per 5/8 los °

## 2017-10-27 NOTE — Progress Notes (Signed)
Christine Garrett Follow-up Visit:  Assessment: Multiple myeloma in remission [facility]) 62 y.o. female who initially presented with significant weight loss in the context of H. pylori-associated gastritis and alcohol abuse. At the same time, an incidental discovery of a lower thoracic vertebral mass in the context of chronic numbness and weakness in the lower extremities. MRI of the thoracic spine demonstrated a destructive lesion at T8 level and patient underwent surgical treatment. 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 Staging evaluation conducted by our clinic confirmed presence of IgG kappa multiple myeloma. Cytogenetics studies demonstrated normal karyotype, FISH studies are positive for 13q34 & D13S319 loss and no loss of p53. Based on initial information, patient was staged at R-ISS II.   Patient has completed 6 cycles of induction systemic chemotherapy with lenalidomide, bortezomib, and low-dose dexamethasone.  Repeat bone marrow biopsy demonstrated excellent response to treatment and patient subsequently underwent consolidation therapy including melphalan conditioning and autologous stem cell rescue.  Repeat assessment with PET/CT and bone marrow biopsy on 10/13/2017 demonstrates stringent complete response maintenance.  Based on degree of response to treatment, patient is being recommended for maintenance therapy with reduced dose lenalidomide at 5 mg daily on days 1 through 21/28 day cycle.  Patient will also continue receiving skeletal support with zoledronic acid administered every 3 months.  Plan: --Initiate systemic maintenance therapy with daily lenalidomide 5 mg daily on day 1-21/20/28 da treatment will continue for duration of 2 years. - Zoledronic acid every 3 months with dose adjusted to creatinine clearance.  Treatment will be continued for total of 2 years. --Disease relapse assessment based on monthly lab work for the first year as  well as repeat bone marrow biopsy is as outlined in the oncological history.  Bone marrow biopsies will be conducted by [facility]. - Immunization schedule at the discretion of [facility] oncology. -Return to our clinic in 1 month with labs for hematological observation and toxicity monitoring for lenalidomide maintenance.  Voice recognition software was used and creation of this note. Despite my best effort at editing the text, some misspelling/errors may have occurred.  Orders Placed This Encounter  Procedures  . CBC with Differential ([facility] Only)    Standing Status:   Future    Standing Expiration Date:   10/23/2018  . CMP ([place] only)    Standing Status:   Future    Standing Expiration Date:   10/23/2018  . Beta 2 microglobulin, serum    Standing Status:   Future    Standing Expiration Date:   10/23/2018  . Lactate dehydrogenase (LDH)    Standing Status:   Future    Standing Expiration Date:   10/22/2018    [facility] Staging Multiple myeloma in remission [facility]) Staging form: Plasma Cell Myeloma and Plasma Cell Disorders, AJCC 8th Edition - Clinical stage from 01/24/2017: RISS Stage II (Beta-2-microglobulin (mg/L): 2.3, Albumin (g/dL): 2.9, ISS: Stage II, High-risk cytogenetics: Absent, LDH: Normal) - Signed by [name], MD on 02/21/2017   All questions were answered.  . The patient knows to call the clinic with any problems, questions or concerns.  This note was electronically signed.    History of Presenting Illness Christine Garrett is a  62 y.o. female followed in the [place] for diagnosis of active IgG kappa multiple myeloma. Please see oncologic history below for details.  Patient initially presented with epigastric abdominal pain that started in Apr 2018. Patient was previously self medicating with Aleve and  Naprosyn or chronic pains. She did not have any melena or hematochezia. Due to the intensity of the pain, patient underwent EGD on 11/29/16 that  demonstrates presence of H. pylori-associated gastritis. At the time of presentation, family reported weight loss of 60 pounds over 3-4 month period. Previously, patient has been habitually drinking alcohol up to 6 beverages per day, quit drinking mid-May 2018. Continued to have abdominal pain and underwent imaging assessment with CT of the abdomen and pelvis on 12/16/16 did demonstrate presence of a mass lesion with spinal cord compression at level of T8. Compression was relieved surgically. She has been recovering well from surgery.  Patient returns to the clinic to discuss possible initiation of maintenance systemic therapy after undergoing day 100 assessment at [facility].  Assessment.  Demonstrates stringent complete response as outlined below.  The recommendation at this time is to proceed with maintenance therapy with daily lenalidomide.  Denies any interval infections.  Denies any interval fevers, chills, night sweats.  No new neuropathy, skeletal pain, nausea, vomiting, abdominal pain, diarrhea, or constipation.  Oncological/hematological History: --Labs, 11/22/16: Ca 9.5, Cr 0.86; WBC 5.6, Hgb 12.2, Plt 270;  --EGD, 11/29/16: Gastritis positive for H. pylori. Treated with omeprazole, clarithromycin, and amoxicillin x14d  --CT A/P, 12/16/16: Left T8 lytic lesion with soft tissue extension into the spinal canal with cord compression. Fatty liver measuring 19.6cm. 0.3cm lt lower pole renal calculus, no hydronephrosis;  --MRI T-spine, 12/17/16: An enhancing mass on the left side at T8 invading the canal and compressing the spinal cord.    Multiple myeloma in remission ([place])   12/26/2016 Initial Diagnosis    Multiple myeloma not having achieved remission [facility]): Originally presented with a mass resulting in compression of the spinal cord. Underwent surgery on 12/26/16 for decompression. --Initial Surgical Pathology: Plasma cell neoplasm involvement, IHC -- positive for CD138, CD79a, CD56,  LCA, CD43, kappa light chains & negative for CD20, lambda light chains; --Baseline labs, 12/30/16: tProt 7.4, Alb 2.9, Ca 9.1, Cr 0.96, AP 77; SPEP/SIFE -- M-Spike 1.0g/dL, monoclonal IgG kappa present; IgA 51, IgG 1477, IgM 17; kappa 77.6, lambda 7.4, KLR 10.35; LDH 161, beta-2 microglobulin 2.3; WBC 10.8, Hgb 11.2, Plt 298;      01/16/2017 Imaging    PET-CT: Positive pathological uptake in T9 and proximal right tibia. No other hypermetabolic disease      02/21/2228 Pathology Results    Bone Marrow Biopsy: Hypercellular bone marrow with 16% involvement by plasma cell process FISH: positive for [id number] loss, 13q34 loss, negative for del p53      01/30/2017 - 05/22/2017 Chemotherapy    RVd: Lenalidomide 5m PO QDay, d1-14 + bortezomib 1.381mm2 d1,8,15 + low-dose dexamethasone 4037mO QWk Q21d --Cycle #1, 01/30/17: Complete located by grade 1 nausea --Cycle #2, 09/94/17/40omplicated by grade 2 nausea, grade 1 fatigue --Cycle #3, 09/81/44/81omplicated by progressive LE weakness --Cycle #4, 04/03/17: Bortezomib dose reduced to 1mg43m --Cycle #5, 04/24/17: Bortezomib dose reduced to 1mg/77m complicated by watery diarrhea and nausea, grade 2 --Cycle #6, 05/22/17: delayed due to complications of the previous cycle       04/15/2017 Imaging    PET-CT: No residual hypermetabolic activity in the skeletal structures.      04/21/2017 Pathology Results    BM Bx: --Mildly hypercellular bone marrow with 2% plasma cells with polyclonal appearance. --Flow Cyto: No evidence of monoclonal plasma cell or lymphocyte populations. --CytoGen: 46,XX, no clonal chromosomal abnormalities      07/01/2017 Bone Marrow Transplant  Autologous peripheral blood stem cell transplant: --Mobilization: G-CSF/MOzobil --> [id number] CD34+ cells collected --Conditioning: Melphalan 211m/m2, 06/30/17 --Recommended post-Tx f/u:    --Day 30,   07/30/17: done    --Day 100: 10/20/17: done, lenalidomide maintenance recommended     --Day 180: 12/29/17 -- ?BM Bx     --Day 270: 03/23/18 -- ?BM Bx    --Day 360: 06/29/18 -- ?BM Bx  --Recommended Treatments   --Aspirin 165 mg daily.   - Bisphosphonate therapy with zoledronic acid every 3 months.   - Monthly labs for the first year of therapy.   - Continue Bactrim, acyclovir. --Recommended Immunization schedules:    --83Mo: HepB, PCV13, DTaP, HIB, IPV    --37Mo: HepB, PCV13, DTaP, HIB, IPV    --70Mo: PCV13, DTaP, HIB, IPV    --79Mo: HepB, PPSV23    --30+ Mo: MMR       10/13/2017 Tumor Marker    tProt 6.9, Alb 4.2, Ca 9.5, Cr 0.9, AP 45; Beta-2 microglobulin 2.22;  SPEP -- no monoclonal spike; IgG 562, IgA 65, IgM 32; kappa 8.29, lambda 6.75, KLR 1.23 WBC 4.7, Hgb 12.3, Plt 283      10/13/2017 PET scan    No hypermetabolic activity in the skeletal structures.      10/13/2017 Bone Marrow Biopsy    Normocellular bone marrow with 30% cellularity, trilineage hematopoiesis, and no increase in plasma cells.      10/20/2017 Remission    [place] has achieved sCR      10/22/2017 -  Chemotherapy    Lenalidomide 55mPO Qday, d1-21 Q28d maintenance        Medical History: Past Medical History:  Diagnosis Date  . Arthritis    knees  . [place]  . Carpal tunnel syndrome   . [place]congestive heart failure) [place]  . Diabetes mellitus    type 2  . GERD (gastroesophageal reflux disease)   . Heart murmur    "Little, No concerns" per Dr  DeDannielle Burnt reported.  . Hypertension    controlled using a guided approch with plasma renin activity  . [place] achieved remission [place]23[place]2018  . Neuropathy   . Peripheral vascular disease ([place]  . Sleep apnea    Uses CPAP    Surgical History: Past Surgical History:  Procedure Laterality Date  . BIOPSY  11/28/2016   Procedure: BIOPSY;  Surgeon: ReRogene HoustonMD;  Location: [facility];  Service: Endoscopy;;  gastric  . [place] CERVICAL [place] BI[place]SY     cervical lesion  .  ESOPHAGOGASTRODUODENOSCOPY (EGD) WITH PROPOFOL N/A 11/28/2016   Procedure: ESOPHAGOGASTRODUODENOSCOPY (EGD) WITH PROPOFOL;  Surgeon: ReRogene HoustonMD;  Location: [facility];  Service: Endoscopy;  Laterality: N/A;  9:25  . IR FLUORO GUIDE PORT INSERTION RIGHT  01/29/2017  . IR [place] VASC ACCESS RIGHT  01/29/2017  . lipoma removal     right shoulder 2001  . MULTIPLE EXTRACTIONS WITH ALVEOLOPLASTY Bilateral 08/24/2012   Procedure: MULTIPLE EXTRACTION WITH ALVEOLOPLASTY BIOPSY OF RIGHT AND LEFT MANDIBLE ;  Surgeon: ScGae BonDDS;  [place] Service: Oral Surgery;  Laterality: Bilateral;  . POSTERIOR LUMBAR FUSION 4 LEVEL N/A 12/26/2016   Procedure: THORACIC EIGHT TUMOR RESECTION, THORACIC SIX- THORACIC TEN POSTERIOR SPINAL FUSION;  Surgeon: CaAshok PallMD;  [place] Service: Neurosurgery;  Laterality: N/A;  THORACIC 8 TUMOR RESECTION, THORACIC 6- THORACIC 10 POSTERIOR  SPINAL FUSION  . ROTATOR CUFF REPAIR     right shoulder  . TUBAL LIGATION      Family History: Family History  Problem Relation Age of Onset  . Diabetes Mother   . Hypertension Mother   . Heart disease Mother   . Diabetes Father   . Heart disease Sister   . Diabetes Sister     Social History: Social History   Socioeconomic History  . Marital status: Single    Spouse name: Not on file  . Number of children: Not on file  . Years of education: Not on file  . Highest education level: Not on file  Occupational History  . Not on file  Social Needs  . Financial resource strain: Not on file  . Food insecurity:    Worry: Not on file    Inability: Not on file  . Transportation needs:    Medical: Not on file    Non-medical: Not on file  Tobacco Use  . Smoking status: Former Smoker    Packs/day: 0.50    Years: 6.00    Pack years: 3.00    Types: Cigarettes    Last attempt to quit: 11/23/2011    Years since quitting: 5.9  . Smokeless tobacco: Never Used  . Tobacco comment: quit summer  2013  Substance and Sexual Activity  . Alcohol use: No    Alcohol/week: 3.6 oz    Types: 6 Cans of beer per week  . Drug use: No  . Sexual activity: Not Currently    Birth control/protection: Post-menopausal  Lifestyle  . Physical activity:    Days per week: Not on file    Minutes per session: Not on file  . Stress: Not on file  Relationships  . Social connections:    Talks on phone: Not on file    Gets together: Not on file    Attends religious service: Not on file    Active member of club or organization: Not on file    Attends meetings of clubs or organizations: Not on file    Relationship status: Not on file  . Intimate partner violence:    Fear of current or ex partner: Not on file    Emotionally abused: Not on file    Physically abused: Not on file    Forced sexual activity: Not on file  Other Topics Concern  . Not on file  Social History Narrative  . Not on file    Allergies: Allergies  Allergen Reactions  . No Known Allergies     Medications:  Current Outpatient Medications  Medication Sig Dispense Refill  . acyclovir (ZOVIRAX) 400 MG tablet Take 1 tablet (400 mg total) by mouth 2 (two) times daily. 60 tablet 0  . chlorthalidone (HYGROTON) 25 MG tablet Take 25 mg by mouth 2 (two) times daily.   11  . diltiazem (CARDIZEM CD) 300 MG 24 hr capsule Take 370ms by mouth daily  3  . enalapril (VASOTEC) 20 MG tablet Take 20 mg by mouth 2 (two) times daily.     . folic acid (FOLVITE) 1 MG tablet Take 1 mg by mouth daily.    . insulin degludec (TRESIBA FLEXTOUCH) 100 UNIT/ML SOPN FlexTouch Pen Inject 0.5 mLs (50 Units total) into the skin daily at 10 pm. 5 pen 2  . Insulin Pen Needle (B-D ULTRAFINE III SHORT PEN) 31G X 8 MM MISC 1 each by Does not apply route as directed. 100 each 3  . KLOR-CON  M20 20 MEQ tablet TAKE 1 TABLET BY MOUTH EVERY DAY 30 tablet 2  . lidocaine-prilocaine (EMLA) cream Apply to affected area once 30 g 3  . metFORMIN (GLUCOPHAGE) 1000 MG tablet  Take 1,000 mg by mouth 2 (two) times daily.     Marland Kitchen sulfamethoxazole-trimethoprim (BACTRIM DS,SEPTRA DS) 800-160 MG tablet Take 1 tablet by mouth daily.    Marland Kitchen tiZANidine (ZANAFLEX) 4 MG tablet Take 1 tablet (4 mg total) by mouth every 6 (six) hours as needed for muscle spasms. 60 tablet 0  . aspirin EC 81 MG tablet Take 2 tablets (162 mg total) by mouth daily. 60 tablet 2  . gabapentin (NEURONTIN) 300 MG capsule Take 1 capsule (300 mg total) by mouth 3 (three) times daily. (Patient taking differently: Take 600 mg by mouth 3 (three) times daily. ) 63 capsule 3  . lenalidomide (REVLIMID) 5 MG capsule Take 1 capsule (5 mg total) by mouth daily for 21 days. Take medication daily for 21 days out of each 28-day cycle (1 week off each month). 21 capsule 3  . omeprazole (PRILOSEC) 20 MG capsule Take 1 capsule (20 mg total) by mouth 2 (two) times daily before a meal. 60 capsule 0   No current facility-administered medications for this visit.     Review of Systems: Review of Systems  All other systems reviewed and are negative.   PHYSICAL EXAMINATION Blood pressure 124/83, pulse 91, temperature 99 F (37.2 C), temperature source Oral, resp. rate 18, height 5' 9.5" (1.765 m), weight 181 lb 1.6 oz (82.1 kg), SpO2 100 %.  ECOG PERFORMANCE STATUS: 1 - Symptomatic but completely ambulatory  Physical Exam  Constitutional: She is oriented to person, place, and time and well-developed, well-nourished, and in no distress. No distress.  HENT:  Head: Normocephalic and atraumatic.  Mouth/Throat: Oropharynx is clear and moist. No oropharyngeal exudate.  Eyes: Pupils are equal, round, and reactive to light. Conjunctivae and EOM are normal. No scleral icterus.  Neck: No thyromegaly present.  Cardiovascular: Normal rate, regular rhythm, normal heart sounds and intact distal pulses. Exam reveals no gallop and no friction rub.  No murmur heard. Pulmonary/Chest: Effort normal and breath sounds normal. No respiratory  distress. She has no wheezes. She has no rales.  Abdominal: Soft. Bowel sounds are normal. She exhibits no distension. There is no tenderness. There is no rebound.  Musculoskeletal: She exhibits no edema.  Lymphadenopathy:    She has no cervical adenopathy.  Neurological: She is alert and oriented to person, place, and time. She has normal reflexes. She displays normal reflexes. No cranial nerve deficit.  Skin: Skin is warm and dry. No rash noted. She is not diaphoretic. No erythema. No pallor.       LABORATORY DATA: I have personally reviewed the data as listed: No visits with results within 1 Week(s) from this visit.  Latest known visit with results is:  Appointment on 09/22/2017  Component Date Value Ref Range Status  . WBC Count 09/22/2017 4.3  3.9 - 10.3 K/uL Final  . RBC 09/22/2017 3.80  3.70 - 5.45 MIL/uL Final  . Hemoglobin 09/22/2017 12.5  11.6 - 15.9 g/dL Final  . HCT 09/22/2017 36.8  34.8 - 46.6 % Final  . MCV 09/22/2017 96.8  79.5 - 101.0 fL Final  . MCH 09/22/2017 32.8  25.1 - 34.0 pg Final  . MCHC 09/22/2017 33.9  31.5 - 36.0 g/dL Final  . RDW 09/22/2017 14.1  11.2 - 14.5 % Final  . Platelet Count  09/22/2017 318  145 - 400 K/uL Final  . Neutrophils Relative % 09/22/2017 63  % Final  . Neutro Abs 09/22/2017 2.7  1.5 - 6.5 K/uL Final  . Lymphocytes Relative 09/22/2017 26  % Final  . Lymphs Abs 09/22/2017 1.1  0.9 - 3.3 K/uL Final  . Monocytes Relative 09/22/2017 8  % Final  . Monocytes Absolute 09/22/2017 0.3  0.1 - 0.9 K/uL Final  . Eosinophils Relative 09/22/2017 2  % Final  . Eosinophils Absolute 09/22/2017 0.1  0.0 - 0.5 K/uL Final  . Basophils Relative 09/22/2017 1  % Final  . Basophils Absolute 09/22/2017 0.1  0.0 - 0.1 K/uL Final   Performed at [facility], [place] [address] [place], [place] [address]  . Sodium 09/22/2017 138  136 - 145 mmol/L Final  . Potassium 09/22/2017 3.3* 3.5 - 5.1 mmol/L Final  . Chloride 09/22/2017 100  98 -  109 mmol/L Final  . CO2 09/22/2017 27  22 - 29 mmol/L Final  . Glucose, Bld 09/22/2017 100  70 - 140 mg/dL Final  . BUN 09/22/2017 9  7 - 26 mg/dL Final  . Creatinine 09/22/2017 0.89  0.60 - 1.10 mg/dL Final  . Calcium 09/22/2017 10.2  8.4 - 10.4 mg/dL Final  . Total Protein 09/22/2017 6.9  6.4 - 8.3 g/dL Final  . Albumin 09/22/2017 4.1  3.5 - 5.0 g/dL Final  . AST 09/22/2017 14  5 - 34 U/L Final  . ALT 09/22/2017 8  0 - 55 U/L Final  . Alkaline Phosphatase 09/22/2017 60  40 - 150 U/L Final  . Total Bilirubin 09/22/2017 0.5  0.2 - 1.2 mg/dL Final  . GFR, Est Non Af Am 09/22/2017 >60  >60 mL/min Final  . GFR, Est AFR Am 09/22/2017 >60  >60 mL/min Final   Comment: (NOTE) The eGFR has been calculated using the CKD EPI equation. This calculation has not been validated in all clinical situations. eGFR's persistently <60 mL/min signify possible Chronic Kidney Disease.   Georgiann Hahn gap 09/22/2017 11  3 - 11 Final   Performed at [facility], [place] [address], [place], [place] [address]  . Magnesium 09/22/2017 2.1  1.5 - 2.5 mg/dL Final   Performed at [facility], [place] [address], [place], [place] [address]  . Phosphorus 09/22/2017 3.8  2.5 - 4.6 mg/dL Final   Performed at [place] [address] [place],  [address]       [name], MD

## 2017-10-27 NOTE — Assessment & Plan Note (Signed)
61 y.o. female who initially presented with significant weight loss in the context of H. pylori-associated gastritis and alcohol abuse. At the same time, an incidental discovery of a lower thoracic vertebral mass in the context of chronic numbness and weakness in the lower extremities. MRI of the thoracic spine demonstrated a destructive lesion at T8 level and patient underwent surgical treatment. 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 Staging evaluation conducted by our clinic confirmed presence of IgG kappa multiple myeloma. Cytogenetics studies demonstrated normal karyotype, FISH studies are positive for 13q34 & D13S319 loss and no loss of p53. Based on initial information, patient was staged at R-ISS II.   Patient has completed 6 cycles of induction systemic chemotherapy with lenalidomide, bortezomib, and low-dose dexamethasone.  Repeat bone marrow biopsy demonstrated excellent response to treatment and patient subsequently underwent consolidation therapy including melphalan conditioning and autologous stem cell rescue.  Repeat assessment with PET/CT and bone marrow biopsy on 10/13/2017 demonstrates stringent complete response maintenance.  Based on degree of response to treatment, patient is being recommended for maintenance therapy with reduced dose lenalidomide at 5 mg daily on days 1 through 21/28 day cycle.  Patient will also continue receiving skeletal support with zoledronic acid administered every 3 months.  Plan: --Initiate systemic maintenance therapy with daily lenalidomide 5 mg daily on day 1-21/20/28 da treatment will continue for duration of 2 years. - Zoledronic acid every 3 months with dose adjusted to creatinine clearance.  Treatment will be continued for total of 2 years. --Disease relapse assessment based on monthly lab work for the first year as well as repeat bone marrow biopsy is as outlined in the oncological history.  Bone marrow biopsies will be  conducted by [facility]. - Immunization schedule at the discretion of [facility] oncology. -Return to our clinic in 1 month with labs for hematological observation and toxicity monitoring for lenalidomide maintenance. 

## 2017-11-20 ENCOUNTER — Other Ambulatory Visit: Payer: Self-pay

## 2017-11-20 NOTE — Progress Notes (Signed)
HEMATOLOGY/ONCOLOGY CONSULTATION NOTE  Date of Service: 11/21/2017  Patient Care Team: Neale Burly, MD as PCP - General (Internal Medicine)  CHIEF COMPLAINTS/PURPOSE OF CONSULTATION:  F/u for continued mx of Multiple myeloma  Oncologic History:  62 y.o. female who initially presented with significant weight loss in the context of H. pylori-associated gastritis and alcohol abuse. At the same time, an incidental discovery of a lower thoracic vertebral mass in the context of chronic numbness and weakness in the lower extremities. MRI of the thoracic spine demonstrated a destructive lesion at T8 level and patient underwent surgical treatment. 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 Staging evaluation conducted by our clinic confirmed presence of IgG kappa multiple myeloma. Cytogenetics studies demonstrated normal karyotype, FISH studies are positive for 13q34 & D13S319 loss and no loss of p53. Based on initial information, patient was staged at [facility].   Patient has completed 6 cycles of induction systemic chemotherapy with lenalidomide, bortezomib, and low-dose dexamethasone.  Repeat bone marrow biopsy demonstrated excellent response to treatment and patient subsequently underwent consolidation therapy including melphalan conditioning and autologous stem cell rescue.  Repeat assessment with PET/CT and bone marrow biopsy on 10/13/2017 demonstrates stringent complete response maintenance.  HISTORY OF PRESENTING ILLNESS:   Christine Garrett is a wonderful 62 y.o. female who has been referred to [place] by my colleague Dr Grace Isaac for evaluation and management of Multiple myeloma. The pt reports that she is doing well overall.   The pt has previously been followed by my colleague Dr Grace Isaac and completed 6 cycles of induction with Revlimid, Velcade and dexamethasone. She had a autologous PBSCT on 07/01/17. She is on lenalidomide maintenance currently.    The pt reports that she has had a headache for the past week, noting a constant pounding in the front of her forehead. She denies fevers and is always cold. She is having clear, nasal discharge and seasonal allergies. She notes aspirin has alleviated her headache some.   She is continuing to take Revlimid on 21 day cycles.  Most recent lab results (11/21/17) of CBC and CMP is as follows: all values are WNL.  MMP 11/21/17 is shows no M spike   On review of systems, pt reports headache, moving her bowels well, increasing strength, and denies mouth sores, fevers, night sweats, and any other symptoms.   MEDICAL HISTORY:  Past Medical History:  Diagnosis Date  . Arthritis    knees  . Cancer ([place])   . Carpal tunnel syndrome   . CHF (congestive heart failure) ([place])   . Diabetes mellitus    type 2  . GERD (gastroesophageal reflux disease)   . Heart murmur    "Little, No concerns" per Dr  Dannielle Burn pt reported.  . Hypertension    controlled using a guided approch with plasma renin activity  . Multiple myeloma not having achieved remission ([place]) 01/06/2017  . Neuropathy   . Peripheral vascular disease ([place])   . Sleep apnea    Uses CPAP    SURGICAL HISTORY: Past Surgical History:  Procedure Laterality Date  . BIOPSY  11/28/2016   Procedure: BIOPSY;  Surgeon: Rogene Houston, MD;  [facility];  Service: Endoscopy;;  gastric  . [place]  . CERVICAL CONE BIOPSY     cervical lesion  . ESOPHAGOGASTRODUODENOSCOPY (EGD) WITH PROPOFOL N/A 11/28/2016   Procedure: ESOPHAGOGASTRODUODENOSCOPY (EGD) WITH PROPOFOL;  Surgeon: Rogene Houston, MD;  [facility];  Service: Endoscopy;  Laterality: N/A;  9:25  . IR FLUORO GUIDE PORT INSERTION RIGHT  01/29/2017  . IR US GUIDE VASC ACCESS RIGHT  01/29/2017  . lipoma removal     right shoulder 2001  . MULTIPLE EXTRACTIONS WITH ALVEOLOPLASTY Bilateral 08/24/2012   Procedure: MULTIPLE EXTRACTION WITH ALVEOLOPLASTY BIOPSY OF RIGHT AND  LEFT MANDIBLE ;  Surgeon: Gae Bon, DDS;  Location: [place];  Service: Oral Surgery;  Laterality: Bilateral;  . POSTERIOR LUMBAR FUSION 4 LEVEL N/A 12/26/2016   Procedure: THORACIC EIGHT TUMOR RESECTION, THORACIC SIX- THORACIC TEN POSTERIOR SPINAL FUSION;  Surgeon: Ashok Pall, MD;  Location: [place];  Service: Neurosurgery;  Laterality: N/A;  THORACIC 8 TUMOR RESECTION, THORACIC 6- THORACIC 10 POSTERIOR SPINAL FUSION  . ROTATOR CUFF REPAIR     right shoulder  . TUBAL LIGATION      SOCIAL HISTORY: Social History   Socioeconomic History  . Marital status: Single    Spouse name: Not on file  . Number of children: Not on file  . Years of education: Not on file  . Highest education level: Not on file  Occupational History  . Not on file  Social Needs  . Financial resource strain: Not on file  . Food insecurity:    Worry: Not on file    Inability: Not on file  . Transportation needs:    Medical: Not on file    Non-medical: Not on file  Tobacco Use  . Smoking status: Former Smoker    Packs/day: 0.50    Years: 6.00    Pack years: 3.00    Types: Cigarettes    Last attempt to quit: 11/23/2011    Years since quitting: 6.0  . Smokeless tobacco: Never Used  . Tobacco comment: quit summer 2013  Substance and Sexual Activity  . Alcohol use: No    Alcohol/week: 3.6 oz    Types: 6 Cans of beer per week  . Drug use: No  . Sexual activity: Not Currently    Birth control/protection: Post-menopausal  Lifestyle  . Physical activity:    Days per week: Not on file    Minutes per session: Not on file  . Stress: Not on file  Relationships  . Social connections:    Talks on phone: Not on file    Gets together: Not on file    Attends religious service: Not on file    Active member of club or organization: Not on file    Attends meetings of clubs or organizations: Not on file    Relationship status: Not on file  . Intimate partner violence:    Fear of current or ex partner: Not on file     Emotionally abused: Not on file    Physically abused: Not on file    Forced sexual activity: Not on file  Other Topics Concern  . Not on file  Social History Narrative  . Not on file    FAMILY HISTORY: Family History  Problem Relation Age of Onset  . Diabetes Mother   . Hypertension Mother   . Heart disease Mother   . Diabetes Father   . Heart disease Sister   . Diabetes Sister     ALLERGIES:  is allergic to no known allergies.  MEDICATIONS:  Current Outpatient Medications  Medication Sig Dispense Refill  . acyclovir (ZOVIRAX) 400 MG tablet Take 1 tablet (400 mg total) by mouth 2 (two) times daily. 60 tablet 0  . aspirin EC 81  MG tablet Take 2 tablets (162 mg total) by mouth daily. 60 tablet 2  . chlorthalidone (HYGROTON) 25 MG tablet Take 25 mg by mouth 2 (two) times daily.   11  . diltiazem (CARDIZEM CD) 300 MG 24 hr capsule Take 357ms by mouth daily  3  . enalapril (VASOTEC) 20 MG tablet Take 20 mg by mouth 2 (two) times daily.     . folic acid (FOLVITE) 1 MG tablet Take 1 mg by mouth daily.    .Marland Kitchengabapentin (NEURONTIN) 300 MG capsule Take 1 capsule (300 mg total) by mouth 3 (three) times daily. (Patient taking differently: Take 600 mg by mouth 3 (three) times daily. ) 63 capsule 3  . insulin degludec (TRESIBA FLEXTOUCH) 100 UNIT/ML SOPN FlexTouch Pen Inject 0.5 mLs (50 Units total) into the skin daily at 10 pm. 5 pen 2  . Insulin Pen Needle (B-D ULTRAFINE III SHORT PEN) 31G X 8 MM MISC 1 each by Does not apply route as directed. 100 each 3  . KLOR-CON M20 20 MEQ tablet TAKE 1 TABLET BY MOUTH EVERY DAY 30 tablet 2  . lidocaine-prilocaine (EMLA) cream Apply to affected area once 30 g 3  . metFORMIN (GLUCOPHAGE) 1000 MG tablet Take 1,000 mg by mouth 2 (two) times daily.     .Marland Kitchenomeprazole (PRILOSEC) 20 MG capsule Take 1 capsule (20 mg total) by mouth 2 (two) times daily before a meal. 60 capsule 0  . sulfamethoxazole-trimethoprim (BACTRIM DS,SEPTRA DS) 800-160 MG tablet Take  1 tablet by mouth daily.    .Marland KitchentiZANidine (ZANAFLEX) 4 MG tablet Take 1 tablet (4 mg total) by mouth every 6 (six) hours as needed for muscle spasms. 60 tablet 0   No current facility-administered medications for this visit.     REVIEW OF SYSTEMS:    10 Point review of Systems was done is negative except as noted above.  PHYSICAL EXAMINATION: ECOG PERFORMANCE STATUS: 1 - Symptomatic but completely ambulatory  . Vitals:   11/21/17 1008  BP: (!) 159/90  Pulse: 75  Resp: 18  Temp: 98.8 F (37.1 C)  SpO2: 100%   Filed Weights   11/21/17 1008  Weight: 191 lb 4.8 oz (86.8 kg)   .Body mass index is 27.85 kg/m.  GENERAL:alert, in no acute distress and comfortable SKIN: no acute rashes, no significant lesions EYES: conjunctiva are pink and non-injected, sclera anicteric OROPHARYNX: MMM, no exudates, no oropharyngeal erythema or ulceration NECK: supple, no JVD LYMPH:  no palpable lymphadenopathy in the cervical, axillary or inguinal regions LUNGS: clear to auscultation b/l with normal respiratory effort HEART: regular rate & rhythm ABDOMEN:  normoactive bowel sounds , non tender, not distended. Extremity: no pedal edema PSYCH: alert & oriented x 3 with fluent speech NEURO: no focal motor/sensory deficits  LABORATORY DATA:  I have reviewed the data as listed  . CBC Latest Ref Rng & Units 11/21/2017 09/22/2017 08/22/2017  WBC 3.9 - 10.3 K/uL 4.6 4.3 5.6  Hemoglobin 11.6 - 15.9 g/dL 12.1 12.5 9.8(L)  Hematocrit 34.8 - 46.6 % 35.6 36.8 29.6(L)  Platelets 145 - 400 K/uL 349 318 266    . CMP Latest Ref Rng & Units 11/21/2017 09/22/2017 08/22/2017  Glucose 70 - 140 mg/dL 99 100 95  BUN 7 - 26 mg/dL '14 9 7  ' Creatinine 0.60 - 1.10 mg/dL 0.96 0.89 0.78  Sodium 136 - 145 mmol/L 142 138 143  Potassium 3.5 - 5.1 mmol/L 3.5 3.3(L) 4.1  Chloride 98 - 109 mmol/L 103  100 110(H)  CO2 22 - 29 mmol/L '27 27 25  ' Calcium 8.4 - 10.4 mg/dL 9.9 10.2 9.3  Total Protein 6.4 - 8.3 g/dL 7.2 6.9 6.2(L)    Total Bilirubin 0.2 - 1.2 mg/dL 0.4 0.5 0.3  Alkaline Phos 40 - 150 U/L 68 60 67  AST 5 - 34 U/L '13 14 11  ' ALT 0 - 55 U/L '7 8 9       ' 04/21/17 Cytogenetics:    01/17/17 Cytogenetics:    RADIOGRAPHIC STUDIES: I have personally reviewed the radiological images as listed and agreed with the findings in the report. No results found.  10/13/17 PET/CT     ASSESSMENT & PLAN:  62 y.o. female with  1. Multiple myeloma Currently in remission Autologous PBSCT on 07/01/17. IgG kappa multiple myeloma. Cytogenetics studies demonstrated normal karyotype, FISH studies are positive for 13q34 & D13S319 loss and no loss of p53. Based on initial information, patient was staged at [facility].  Completed 6 cycles of induction with Revlimid, Velcade and dexamethasone  Plan: -Discussed patient's most recent labs from 11/21/17, blood counts and chemistries are normal.  -MMP - no M spike -Discussed that she should call [facility] to confirm the course of prophylactic antibiotics -Recommend OTC decongestant for sinus headaches, saline spray and steam inhalation also recommended -If headaches gets worse she will let me or her PCP know -She will see her transplant team in one month, and we will see her back in August 2019 -vaccination per transplant team.  Continue IV Zometa q12 weeks with labs RTC with Dr Irene Limbo with labs in 8 weeks   All of the patients questions were answered with apparent satisfaction. The patient knows to call the clinic with any problems, questions or concerns.  The total time spent in the appt was 25 minutes and more than 50% was on counseling and direct patient cares.    Sullivan Lone MD MS AAHIVMS [facility] Hematology/Oncology Physician [facility]  (Office):       [phone] (Work cell):  [phone] (Fax):           [phone]  11/21/2017 10:03 AM  I, Baldwin [place], am acting as a Education administrator for Dr Irene Limbo.   .I have reviewed the above documentation for  accuracy and completeness, and I agree with the above. Brunetta Genera MD

## 2017-11-21 ENCOUNTER — Inpatient Hospital Stay: Payer: Medicare Other

## 2017-11-21 ENCOUNTER — Encounter: Payer: Self-pay | Admitting: Hematology

## 2017-11-21 ENCOUNTER — Inpatient Hospital Stay: Payer: Medicare Other | Attending: Hematology and Oncology | Admitting: Hematology

## 2017-11-21 VITALS — BP 159/90 | HR 75 | Temp 98.8°F | Resp 18 | Ht 69.5 in | Wt 191.3 lb

## 2017-11-21 DIAGNOSIS — C9001 Multiple myeloma in remission: Secondary | ICD-10-CM | POA: Diagnosis not present

## 2017-11-21 DIAGNOSIS — R51 Headache: Secondary | ICD-10-CM | POA: Diagnosis not present

## 2017-11-21 DIAGNOSIS — Z87891 Personal history of nicotine dependence: Secondary | ICD-10-CM | POA: Diagnosis not present

## 2017-11-21 LAB — CBC WITH DIFFERENTIAL (CANCER CENTER ONLY)
Basophils Absolute: 0 10*3/uL (ref 0.0–0.1)
Basophils Relative: 0 %
EOS ABS: 0.1 10*3/uL (ref 0.0–0.5)
EOS PCT: 3 %
HCT: 35.6 % (ref 34.8–46.6)
Hemoglobin: 12.1 g/dL (ref 11.6–15.9)
LYMPHS ABS: 1.6 10*3/uL (ref 0.9–3.3)
Lymphocytes Relative: 34 %
MCH: 32 pg (ref 25.1–34.0)
MCHC: 34.1 g/dL (ref 31.5–36.0)
MCV: 93.7 fL (ref 79.5–101.0)
MONO ABS: 0.4 10*3/uL (ref 0.1–0.9)
Monocytes Relative: 8 %
Neutro Abs: 2.6 10*3/uL (ref 1.5–6.5)
Neutrophils Relative %: 55 %
PLATELETS: 349 10*3/uL (ref 145–400)
RBC: 3.8 MIL/uL (ref 3.70–5.45)
RDW: 14.5 % (ref 11.2–14.5)
WBC: 4.6 10*3/uL (ref 3.9–10.3)

## 2017-11-21 LAB — CMP (CANCER CENTER ONLY)
ALBUMIN: 4.1 g/dL (ref 3.5–5.0)
ALK PHOS: 68 U/L (ref 40–150)
ALT: 7 U/L (ref 0–55)
AST: 13 U/L (ref 5–34)
Anion gap: 12 — ABNORMAL HIGH (ref 3–11)
BILIRUBIN TOTAL: 0.4 mg/dL (ref 0.2–1.2)
BUN: 14 mg/dL (ref 7–26)
CO2: 27 mmol/L (ref 22–29)
CREATININE: 0.96 mg/dL (ref 0.60–1.10)
Calcium: 9.9 mg/dL (ref 8.4–10.4)
Chloride: 103 mmol/L (ref 98–109)
GFR, Est AFR Am: >60 mL/min (ref >60)
GLUCOSE: 99 mg/dL (ref 70–140)
POTASSIUM: 3.5 mmol/L (ref 3.5–5.1)
Sodium: 142 mmol/L (ref 136–145)
TOTAL PROTEIN: 7.2 g/dL (ref 6.4–8.3)

## 2017-11-21 LAB — LACTATE DEHYDROGENASE: LDH: 182 U/L (ref 125–245)

## 2017-11-21 MED ORDER — ZOLEDRONIC ACID 4 MG/100ML IV SOLN
4.0000 mg | Freq: Once | INTRAVENOUS | Status: AC
  Administered 2017-11-21: 4 mg via INTRAVENOUS
  Filled 2017-11-21: qty 100

## 2017-11-21 MED ORDER — SODIUM CHLORIDE 0.9 % IV SOLN
Freq: Once | INTRAVENOUS | Status: AC
  Administered 2017-11-21: 11:00:00 via INTRAVENOUS

## 2017-11-21 MED ORDER — SODIUM CHLORIDE 0.9% FLUSH
10.0000 mL | Freq: Once | INTRAVENOUS | Status: AC | PRN
  Administered 2017-11-21: 10 mL
  Filled 2017-11-21: qty 10

## 2017-11-21 MED ORDER — HEPARIN SOD (PORK) LOCK FLUSH 100 UNIT/ML IV SOLN
500.0000 [IU] | Freq: Once | INTRAVENOUS | Status: AC | PRN
Start: 2017-11-21 — End: 2017-11-21
  Administered 2017-11-21: 500 [IU]
  Filled 2017-11-21: qty 5

## 2017-11-21 NOTE — Patient Instructions (Signed)
[facility] Discharge Instructions for Patients Receiving Chemotherapy  Today you received the following chemotherapy agents: Zometa (zoledronic acid)  To help prevent nausea and vomiting after your treatment, we encourage you to take your nausea medication as directed.   If you develop nausea and vomiting that is not controlled by your nausea medication, call the clinic.   BELOW ARE SYMPTOMS THAT SHOULD BE REPORTED IMMEDIATELY:  *FEVER GREATER THAN 100.5 F  *CHILLS WITH OR WITHOUT FEVER  NAUSEA AND VOMITING THAT IS NOT CONTROLLED WITH YOUR NAUSEA MEDICATION  *UNUSUAL SHORTNESS OF BREATH  *UNUSUAL BRUISING OR BLEEDING  TENDERNESS IN MOUTH AND THROAT WITH OR WITHOUT PRESENCE OF ULCERS  *URINARY PROBLEMS  *BOWEL PROBLEMS  UNUSUAL RASH Items with * indicate a potential emergency and should be followed up as soon as possible.  Feel free to call the clinic should you have any questions or concerns. The clinic phone number is [phone].  Please show the CHEMO ALERT CARD at check-in to the Emergency Department and triage nurse.   

## 2017-11-22 LAB — BETA 2 MICROGLOBULIN, SERUM: Beta-2 Microglobulin: 1.6 mg/L (ref 0.6–2.4)

## 2017-11-24 ENCOUNTER — Telehealth: Payer: Self-pay | Admitting: Hematology

## 2017-11-24 LAB — MULTIPLE MYELOMA PANEL, SERUM
ALBUMIN/GLOB SERPL: 1.6 (ref 0.7–1.7)
ALPHA 1: 0.2 g/dL (ref 0.0–0.4)
ALPHA2 GLOB SERPL ELPH-MCNC: 0.7 g/dL (ref 0.4–1.0)
Albumin SerPl Elph-Mcnc: 3.8 g/dL (ref 2.9–4.4)
B-Globulin SerPl Elph-Mcnc: 1 g/dL (ref 0.7–1.3)
Gamma Glob SerPl Elph-Mcnc: 0.5 g/dL (ref 0.4–1.8)
Globulin, Total: 2.5 g/dL (ref 2.2–3.9)
IGG (IMMUNOGLOBIN G), SERUM: 691 mg/dL — AB (ref 700–1600)
IGM (IMMUNOGLOBULIN M), SRM: 33 mg/dL (ref 26–217)
IgA: 71 mg/dL — ABNORMAL LOW (ref 87–352)
TOTAL PROTEIN ELP: 6.3 g/dL (ref 6.0–8.5)

## 2017-11-24 LAB — KAPPA/LAMBDA LIGHT CHAINS
KAPPA, LAMDA LIGHT CHAIN RATIO: 1.18 (ref 0.26–1.65)
Kappa free light chain: 12.5 mg/L (ref 3.3–19.4)
Lambda free light chains: 10.6 mg/L (ref 5.7–26.3)

## 2017-11-24 NOTE — Telephone Encounter (Signed)
Spoke with patient regarding upcoming appointments/times on 8/7 & 9/6.

## 2017-11-25 ENCOUNTER — Other Ambulatory Visit: Payer: Self-pay

## 2017-11-26 ENCOUNTER — Other Ambulatory Visit: Payer: Self-pay | Admitting: Hematology

## 2017-11-26 MED ORDER — LENALIDOMIDE 5 MG PO CAPS: 5.0000 mg | ORAL_CAPSULE | Freq: Every day | ORAL | 3 refills | Status: DC

## 2017-11-27 DIAGNOSIS — D492 Neoplasm of unspecified behavior of bone, soft tissue, and skin: Secondary | ICD-10-CM | POA: Diagnosis not present

## 2017-12-11 DIAGNOSIS — I10 Essential (primary) hypertension: Secondary | ICD-10-CM | POA: Diagnosis not present

## 2017-12-11 DIAGNOSIS — E7849 Other hyperlipidemia: Secondary | ICD-10-CM | POA: Diagnosis not present

## 2017-12-11 DIAGNOSIS — C9001 Multiple myeloma in remission: Secondary | ICD-10-CM | POA: Diagnosis not present

## 2017-12-11 DIAGNOSIS — E1149 Type 2 diabetes mellitus with other diabetic neurological complication: Secondary | ICD-10-CM | POA: Diagnosis not present

## 2017-12-11 DIAGNOSIS — J441 Chronic obstructive pulmonary disease with (acute) exacerbation: Secondary | ICD-10-CM | POA: Diagnosis not present

## 2017-12-29 DIAGNOSIS — Z23 Encounter for immunization: Secondary | ICD-10-CM | POA: Diagnosis not present

## 2017-12-29 DIAGNOSIS — G629 Polyneuropathy, unspecified: Secondary | ICD-10-CM | POA: Diagnosis not present

## 2017-12-29 DIAGNOSIS — C9001 Multiple myeloma in remission: Secondary | ICD-10-CM | POA: Diagnosis not present

## 2017-12-29 DIAGNOSIS — G620 Drug-induced polyneuropathy: Secondary | ICD-10-CM | POA: Diagnosis not present

## 2017-12-29 DIAGNOSIS — Z9484 Stem cells transplant status: Secondary | ICD-10-CM | POA: Diagnosis not present

## 2017-12-29 DIAGNOSIS — E559 Vitamin D deficiency, unspecified: Secondary | ICD-10-CM | POA: Diagnosis not present

## 2017-12-29 DIAGNOSIS — C9000 Multiple myeloma not having achieved remission: Secondary | ICD-10-CM | POA: Diagnosis not present

## 2017-12-29 DIAGNOSIS — Z7983 Long term (current) use of bisphosphonates: Secondary | ICD-10-CM | POA: Diagnosis not present

## 2017-12-30 ENCOUNTER — Other Ambulatory Visit: Payer: Self-pay

## 2017-12-30 MED ORDER — LENALIDOMIDE 5 MG PO CAPS: 5.0000 mg | ORAL_CAPSULE | Freq: Every day | ORAL | 3 refills | Status: DC

## 2018-01-20 NOTE — Progress Notes (Signed)
HEMATOLOGY/ONCOLOGY CLINIC NOTE  Date of Service: 01/21/2018  Patient Care Team: Neale Burly, MD as PCP - General (Internal Medicine)  CHIEF COMPLAINTS/PURPOSE OF CONSULTATION:  F/u for continued mx of Multiple myeloma  Oncologic History:  62 y.o. female who initially presented with significant weight loss in the context of H. pylori-associated gastritis and alcohol abuse. At the same time, an incidental discovery of a lower thoracic vertebral mass in the context of chronic numbness and weakness in the lower extremities. MRI of the thoracic spine demonstrated a destructive lesion at T8 level and patient underwent surgical treatment. 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 Staging evaluation conducted by our clinic confirmed presence of IgG kappa multiple myeloma. Cytogenetics studies demonstrated normal karyotype, FISH studies are positive for 13q34 & D13S319 loss and no loss of p53. Based on initial information, patient was staged at [facility].   Patient has completed 6 cycles of induction systemic chemotherapy with lenalidomide, bortezomib, and low-dose dexamethasone.  Repeat bone marrow biopsy demonstrated excellent response to treatment and patient subsequently underwent consolidation therapy including melphalan conditioning and autologous stem cell rescue.  Repeat assessment with PET/CT and bone marrow biopsy on 10/13/2017 demonstrates stringent complete response maintenance.  HISTORY OF PRESENTING ILLNESS:   Christine Garrett is a wonderful 62 y.o. female who has been referred to [place] by my colleague Dr Grace Isaac for evaluation and management of Multiple myeloma. The pt reports that she is doing well overall.   The pt has previously been followed by my colleague Dr Grace Isaac and completed 6 cycles of induction with Revlimid, Velcade and dexamethasone. She had a autologous PBSCT on 07/01/17. She is on lenalidomide maintenance currently.   The  pt reports that she has had a headache for the past week, noting a constant pounding in the front of her forehead. She denies fevers and is always cold. She is having clear, nasal discharge and seasonal allergies. She notes aspirin has alleviated her headache some.   She is continuing to take Revlimid on 21 day cycles.  Most recent lab results (11/21/17) of CBC and CMP is as follows: all values are WNL.  MMP 11/21/17 is shows no M spike   On review of systems, pt reports headache, moving her bowels well, increasing strength, and denies mouth sores, fevers, night sweats, and any other symptoms.  Interval History:   Christine Garrett returns today for management and evaluation of her Multiple Myeloma currently in remission. The patient's last visit with [place] was on 11/21/17. The pt reports that she is doing well overall.   The pt reports that her right leg has become more stiff, swollen and painful. She was increased on her 64m Gabapentin in the interim to four times a day. She is taking [place] as well. She notes that her blood sugars have been well controlled. She notes that she fell this week in her home, because she was not using her cane.  She notes that her alcohol consumption has decreased to two shots a gin every other day. She is not taking a Vitamin B complex or vitamin replacements. She notes that she has continued in her complete smoking cessation.   Lab results today (01/21/18) of CBC w/diff, CMP, and Reticulocytes is as follows: all values are WNL except for RBC at 3.46, HGB at 11.3, HCT at 33.4, RDW at 14.6, Glucose at 117, AST at 13. MMP 01/21/18 is no M spike  On review of systems, pt reports leg swelling  worse on right, painful peripheral neuropathy, some back pain, eating well, and denies abdominal pains, and any other symptoms.   MEDICAL HISTORY:  Past Medical History:  Diagnosis Date  . Arthritis    knees  . Cancer ([place])   . Carpal tunnel syndrome   . CHF (congestive heart  failure) ([place])   . Diabetes mellitus    type 2  . GERD (gastroesophageal reflux disease)   . Heart murmur    "Little, No concerns" per Dr  Dannielle Burn pt reported.  . Hypertension    controlled using a guided approch with plasma renin activity  . Multiple myeloma not having achieved remission ([place]) 01/06/2017  . Neuropathy   . Peripheral vascular disease ([place])   . Sleep apnea    Uses CPAP    SURGICAL HISTORY: Past Surgical History:  Procedure Laterality Date  . BIOPSY  11/28/2016   Procedure: BIOPSY;  Surgeon: Rogene Houston, MD;  [facility];  Service: Endoscopy;;  gastric  . [place]  . CERVICAL CONE BIOPSY     cervical lesion  . ESOPHAGOGASTRODUODENOSCOPY (EGD) WITH PROPOFOL N/A 11/28/2016   Procedure: ESOPHAGOGASTRODUODENOSCOPY (EGD) WITH PROPOFOL;  Surgeon: Rogene Houston, MD;  [facility];  Service: Endoscopy;  Laterality: N/A;  9:25  . IR FLUORO GUIDE PORT INSERTION RIGHT  01/29/2017  . IR US GUIDE VASC ACCESS RIGHT  01/29/2017  . lipoma removal     right shoulder 2001  . MULTIPLE EXTRACTIONS WITH ALVEOLOPLASTY Bilateral 08/24/2012   Procedure: MULTIPLE EXTRACTION WITH ALVEOLOPLASTY BIOPSY OF RIGHT AND LEFT MANDIBLE ;  Surgeon: Gae Bon, DDS;  Location: [place];  Service: Oral Surgery;  Laterality: Bilateral;  . POSTERIOR LUMBAR FUSION 4 LEVEL N/A 12/26/2016   Procedure: THORACIC EIGHT TUMOR RESECTION, THORACIC SIX- THORACIC TEN POSTERIOR SPINAL FUSION;  Surgeon: Ashok Pall, MD;  Location: [place];  Service: Neurosurgery;  Laterality: N/A;  THORACIC 8 TUMOR RESECTION, THORACIC 6- THORACIC 10 POSTERIOR SPINAL FUSION  . ROTATOR CUFF REPAIR     right shoulder  . TUBAL LIGATION      SOCIAL HISTORY: Social History   Socioeconomic History  . Marital status: Single    Spouse name: Not on file  . Number of children: Not on file  . Years of education: Not on file  . Highest education level: Not on file  Occupational History  . Not on file    Social Needs  . Financial resource strain: Not on file  . Food insecurity:    Worry: Not on file    Inability: Not on file  . Transportation needs:    Medical: Not on file    Non-medical: Not on file  Tobacco Use  . Smoking status: Former Smoker    Packs/day: 0.50    Years: 6.00    Pack years: 3.00    Types: Cigarettes    Last attempt to quit: 11/23/2011    Years since quitting: 6.1  . Smokeless tobacco: Never Used  . Tobacco comment: quit summer 2013  Substance and Sexual Activity  . Alcohol use: No    Alcohol/week: 3.6 oz    Types: 6 Cans of beer per week  . Drug use: No  . Sexual activity: Not Currently    Birth control/protection: Post-menopausal  Lifestyle  . Physical activity:    Days per week: Not on file    Minutes per session: Not on file  . Stress: Not on file  Relationships  .  Social connections:    Talks on phone: Not on file    Gets together: Not on file    Attends religious service: Not on file    Active member of club or organization: Not on file    Attends meetings of clubs or organizations: Not on file    Relationship status: Not on file  . Intimate partner violence:    Fear of current or ex partner: Not on file    Emotionally abused: Not on file    Physically abused: Not on file    Forced sexual activity: Not on file  Other Topics Concern  . Not on file  Social History Narrative  . Not on file    FAMILY HISTORY: Family History  Problem Relation Age of Onset  . Diabetes Mother   . Hypertension Mother   . Heart disease Mother   . Diabetes Father   . Heart disease Sister   . Diabetes Sister     ALLERGIES:  is allergic to no known allergies.  MEDICATIONS:  Current Outpatient Medications  Medication Sig Dispense Refill  . acyclovir (ZOVIRAX) 400 MG tablet Take 1 tablet (400 mg total) by mouth 2 (two) times daily. 60 tablet 0  . chlorthalidone (HYGROTON) 25 MG tablet Take 25 mg by mouth 2 (two) times daily.   11  . diltiazem (CARDIZEM CD)  300 MG 24 hr capsule Take 374ms by mouth daily  3  . enalapril (VASOTEC) 20 MG tablet Take 20 mg by mouth 2 (two) times daily.     . folic acid (FOLVITE) 1 MG tablet Take 1 mg by mouth daily.    .Marland Kitchengabapentin (NEURONTIN) 300 MG capsule Take 1 capsule (300 mg total) by mouth 3 (three) times daily. (Patient taking differently: Take 600 mg by mouth 3 (three) times daily. ) 63 capsule 3  . insulin degludec (TRESIBA FLEXTOUCH) 100 UNIT/ML SOPN FlexTouch Pen Inject 0.5 mLs (50 Units total) into the skin daily at 10 pm. 5 pen 2  . Insulin Pen Needle (B-D ULTRAFINE III SHORT PEN) 31G X 8 MM MISC 1 each by Does not apply route as directed. 100 each 3  . KLOR-CON M20 20 MEQ tablet TAKE 1 TABLET BY MOUTH EVERY DAY 30 tablet 2  . lenalidomide (REVLIMID) 5 MG capsule Take 1 capsule (5 mg total) by mouth daily. 3 weeks on 1 week off.  Auth # [id number] capsule 3  . lidocaine-prilocaine (EMLA) cream Apply to affected area once 30 g 3  . metFORMIN (GLUCOPHAGE) 1000 MG tablet Take 1,000 mg by mouth 2 (two) times daily.     .Marland Kitchenomeprazole (PRILOSEC) 20 MG capsule Take 1 capsule (20 mg total) by mouth 2 (two) times daily before a meal. 60 capsule 0  . sulfamethoxazole-trimethoprim (BACTRIM DS,SEPTRA DS) 800-160 MG tablet Take 1 tablet by mouth daily.    .Marland KitchentiZANidine (ZANAFLEX) 4 MG tablet Take 1 tablet (4 mg total) by mouth every 6 (six) hours as needed for muscle spasms. 60 tablet 0   No current facility-administered medications for this visit.     REVIEW OF SYSTEMS:    A 10+ POINT REVIEW OF SYSTEMS WAS OBTAINED including neurology, dermatology, psychiatry, cardiac, respiratory, lymph, extremities, GI, GU, Musculoskeletal, constitutional, breasts, reproductive, HEENT.  All pertinent positives are noted in the HPI.  All others are negative.   PHYSICAL EXAMINATION: ECOG PERFORMANCE STATUS: 1 - Symptomatic but completely ambulatory   Vitals:   01/21/18 1220  BP: 134/82  Pulse: 85  Resp: 18  Temp: 98.9 F  (37.2 C)  SpO2: 100%   Filed Weights   01/21/18 1220  Weight: 202 lb 11.2 oz (91.9 kg)   .Body mass index is 29.5 kg/m.  GENERAL:alert, in no acute distress and comfortable SKIN: no acute rashes, no significant lesions EYES: conjunctiva are pink and non-injected, sclera anicteric OROPHARYNX: MMM, no exudates, no oropharyngeal erythema or ulceration NECK: supple, no JVD LYMPH:  no palpable lymphadenopathy in the cervical, axillary or inguinal regions LUNGS: clear to auscultation b/l with normal respiratory effort HEART: regular rate & rhythm ABDOMEN:  normoactive bowel sounds , non tender, not distended. No palpable hepatosplenomegaly.  Extremity: no pedal edema PSYCH: alert & oriented x 3 with fluent speech NEURO: no focal motor/sensory deficits   LABORATORY DATA:  I have reviewed the data as listed  . CBC Latest Ref Rng & Units 01/21/2018 11/21/2017 09/22/2017  WBC 3.9 - 10.3 K/uL 4.9 4.6 4.3  Hemoglobin 11.6 - 15.9 g/dL 11.3(L) 12.1 12.5  Hematocrit 34.8 - 46.6 % 33.4(L) 35.6 36.8  Platelets 145 - 400 K/uL 237 349 318    . CMP Latest Ref Rng & Units 01/21/2018 11/21/2017 09/22/2017  Glucose 70 - 99 mg/dL 117(H) 99 100  BUN 6 - 20 mg/dL '7 14 9  ' Creatinine 0.44 - 1.00 mg/dL 0.99 0.96 0.89  Sodium 135 - 145 mmol/L 143 142 138  Potassium 3.5 - 5.1 mmol/L 3.9 3.5 3.3(L)  Chloride 98 - 111 mmol/L 110 103 100  CO2 22 - 32 mmol/L '22 27 27  ' Calcium 8.9 - 10.3 mg/dL 9.0 9.9 10.2  Total Protein 6.5 - 8.1 g/dL 7.0 7.2 6.9  Total Bilirubin 0.3 - 1.2 mg/dL 0.4 0.4 0.5  Alkaline Phos 38 - 126 U/L 54 68 60  AST 15 - 41 U/L 13(L) 13 14  ALT 0 - 44 U/L '10 7 8       ' 04/21/17 Cytogenetics:    01/17/17 Cytogenetics:    RADIOGRAPHIC STUDIES: I have personally reviewed the radiological images as listed and agreed with the findings in the report. No results found.  10/13/17 PET/CT     ASSESSMENT & PLAN:  62 y.o. female with  1. Multiple myeloma Currently in remission Autologous  PBSCT on 07/01/17. IgG kappa multiple myeloma. Cytogenetics studies demonstrated normal karyotype, FISH studies are positive for 13q34 & D13S319 loss and no loss of p53. Based on initial information, patient was staged at [facility].  Completed 6 cycles of induction with Revlimid, Velcade and dexamethasone  2. Grade 2 neuropathy Plan: -Discussed pt labwork today, 01/21/18; HGB at 11.3, PLT at 237k,  -Begin taking Vitamin B complex -Continue Zometa every 2 months -Will repeat Vascular US LE given right sided leg swelling -Pt would prefer not to begin Cymbalta for neuropathy -Will refer the pt to a neurologist -Continue Revlimid with 22m aspirin    Continue IV Zometa q12 weeks with labs UKoreavenous lower extremities in 1-2 days neurology referral for mx of neuropathy RTC with Dr KIrene Limbowith labs in 8 weeks    All of the patients questions were answered with apparent satisfaction. The patient knows to call the clinic with any problems, questions or concerns.  The total time spent in the appt was 25 minutes and more than 50% was on counseling and direct patient cares.     GSullivan LoneMD MFisherAAHIVMS [facility] ProvidenceCRiver Valley Behavioral HealthHematology/Oncology Physician CJeff[facility] (Office):       [phone](Work cell):  [phone](  Fax):           [phone]  01/21/2018 1:02 PM  I, Baldwin [place], am acting as a scribe for Dr. Irene Limbo  .I have reviewed the above documentation for accuracy and completeness, and I agree with the above. Brunetta Genera MD

## 2018-01-21 ENCOUNTER — Inpatient Hospital Stay (HOSPITAL_BASED_OUTPATIENT_CLINIC_OR_DEPARTMENT_OTHER): Payer: Medicare Other | Admitting: Hematology

## 2018-01-21 ENCOUNTER — Inpatient Hospital Stay: Payer: Medicare Other

## 2018-01-21 ENCOUNTER — Encounter: Payer: Self-pay | Admitting: Neurology

## 2018-01-21 ENCOUNTER — Inpatient Hospital Stay: Payer: Medicare Other | Attending: Hematology and Oncology

## 2018-01-21 VITALS — BP 134/82 | HR 85 | Temp 98.9°F | Resp 18 | Ht 69.5 in | Wt 202.7 lb

## 2018-01-21 DIAGNOSIS — Z9181 History of falling: Secondary | ICD-10-CM | POA: Diagnosis not present

## 2018-01-21 DIAGNOSIS — Z87891 Personal history of nicotine dependence: Secondary | ICD-10-CM | POA: Insufficient documentation

## 2018-01-21 DIAGNOSIS — G620 Drug-induced polyneuropathy: Secondary | ICD-10-CM | POA: Diagnosis not present

## 2018-01-21 DIAGNOSIS — C9001 Multiple myeloma in remission: Secondary | ICD-10-CM | POA: Diagnosis not present

## 2018-01-21 DIAGNOSIS — M7989 Other specified soft tissue disorders: Secondary | ICD-10-CM | POA: Diagnosis not present

## 2018-01-21 DIAGNOSIS — G629 Polyneuropathy, unspecified: Secondary | ICD-10-CM

## 2018-01-21 DIAGNOSIS — Z7982 Long term (current) use of aspirin: Secondary | ICD-10-CM | POA: Insufficient documentation

## 2018-01-21 LAB — CBC WITH DIFFERENTIAL/PLATELET
Basophils Absolute: 0 10*3/uL (ref 0.0–0.1)
Basophils Relative: 1 %
Eosinophils Absolute: 0.4 10*3/uL (ref 0.0–0.5)
Eosinophils Relative: 7 %
HEMATOCRIT: 33.4 % — AB (ref 34.8–46.6)
HEMOGLOBIN: 11.3 g/dL — AB (ref 11.6–15.9)
LYMPHS PCT: 35 %
Lymphs Abs: 1.7 10*3/uL (ref 0.9–3.3)
MCH: 32.7 pg (ref 25.1–34.0)
MCHC: 33.8 g/dL (ref 31.5–36.0)
MCV: 96.5 fL (ref 79.5–101.0)
MONOS PCT: 8 %
Monocytes Absolute: 0.4 10*3/uL (ref 0.1–0.9)
NEUTROS ABS: 2.4 10*3/uL (ref 1.5–6.5)
NEUTROS PCT: 49 %
Platelets: 237 10*3/uL (ref 145–400)
RBC: 3.46 MIL/uL — AB (ref 3.70–5.45)
RDW: 14.6 % — ABNORMAL HIGH (ref 11.2–14.5)
WBC: 4.9 10*3/uL (ref 3.9–10.3)

## 2018-01-21 LAB — CMP (CANCER CENTER ONLY)
ALK PHOS: 54 U/L (ref 38–126)
ALT: 10 U/L (ref 0–44)
ANION GAP: 11 (ref 5–15)
AST: 13 U/L — AB (ref 15–41)
Albumin: 3.8 g/dL (ref 3.5–5.0)
BILIRUBIN TOTAL: 0.4 mg/dL (ref 0.3–1.2)
BUN: 7 mg/dL (ref 6–20)
CALCIUM: 9 mg/dL (ref 8.9–10.3)
CO2: 22 mmol/L (ref 22–32)
Chloride: 110 mmol/L (ref 98–111)
Creatinine: 0.99 mg/dL (ref 0.44–1.00)
GFR, Est AFR Am: >60 mL/min (ref >60)
Glucose, Bld: 117 mg/dL — ABNORMAL HIGH (ref 70–99)
POTASSIUM: 3.9 mmol/L (ref 3.5–5.1)
Sodium: 143 mmol/L (ref 135–145)
Total Protein: 7 g/dL (ref 6.5–8.1)

## 2018-01-21 LAB — RETICULOCYTES
RBC.: 3.46 MIL/uL — ABNORMAL LOW (ref 3.70–5.45)
Retic Count, Absolute: 65.7 10*3/uL (ref 33.7–90.7)
Retic Ct Pct: 1.9 % (ref 0.7–2.1)

## 2018-01-21 MED ORDER — HEPARIN SOD (PORK) LOCK FLUSH 100 UNIT/ML IV SOLN
500.0000 [IU] | Freq: Once | INTRAVENOUS | Status: AC | PRN
  Administered 2018-01-21: 500 [IU] via INTRAVENOUS
  Filled 2018-01-21: qty 5

## 2018-01-21 MED ORDER — SODIUM CHLORIDE 0.9% FLUSH
10.0000 mL | INTRAVENOUS | Status: DC | PRN
  Administered 2018-01-21: 10 mL via INTRAVENOUS
  Filled 2018-01-21: qty 10

## 2018-01-22 ENCOUNTER — Other Ambulatory Visit: Payer: Self-pay | Admitting: *Deleted

## 2018-01-22 DIAGNOSIS — R202 Paresthesia of skin: Principal | ICD-10-CM

## 2018-01-22 DIAGNOSIS — R200 Anesthesia of skin: Secondary | ICD-10-CM

## 2018-01-22 LAB — KAPPA/LAMBDA LIGHT CHAINS
KAPPA FREE LGHT CHN: 23.8 mg/L — AB (ref 3.3–19.4)
Kappa, lambda light chain ratio: 1.64 (ref 0.26–1.65)
Lambda free light chains: 14.5 mg/L (ref 5.7–26.3)

## 2018-01-23 LAB — MULTIPLE MYELOMA PANEL, SERUM
ALBUMIN SERPL ELPH-MCNC: 3.7 g/dL (ref 2.9–4.4)
ALPHA 1: 0.2 g/dL (ref 0.0–0.4)
Albumin/Glob SerPl: 1.3 (ref 0.7–1.7)
Alpha2 Glob SerPl Elph-Mcnc: 0.8 g/dL (ref 0.4–1.0)
B-Globulin SerPl Elph-Mcnc: 1.1 g/dL (ref 0.7–1.3)
Gamma Glob SerPl Elph-Mcnc: 0.8 g/dL (ref 0.4–1.8)
Globulin, Total: 2.9 g/dL (ref 2.2–3.9)
IGA: 110 mg/dL (ref 87–352)
IGM (IMMUNOGLOBULIN M), SRM: 43 mg/dL (ref 26–217)
IgG (Immunoglobin G), Serum: 874 mg/dL (ref 700–1600)
Total Protein ELP: 6.6 g/dL (ref 6.0–8.5)

## 2018-01-26 ENCOUNTER — Ambulatory Visit (HOSPITAL_COMMUNITY): Payer: Medicare Other

## 2018-01-27 ENCOUNTER — Other Ambulatory Visit: Payer: Self-pay

## 2018-01-27 MED ORDER — LENALIDOMIDE 5 MG PO CAPS: 5.0000 mg | ORAL_CAPSULE | Freq: Every day | ORAL | 3 refills | Status: DC

## 2018-01-29 ENCOUNTER — Ambulatory Visit (HOSPITAL_COMMUNITY)
Admission: RE | Admit: 2018-01-29 | Discharge: 2018-01-29 | Disposition: A | Discharge status: Home / Self Care | Payer: Medicare Other | Source: Ambulatory Visit | Attending: Hematology | Admitting: Hematology

## 2018-01-29 DIAGNOSIS — M7989 Other specified soft tissue disorders: Secondary | ICD-10-CM | POA: Insufficient documentation

## 2018-01-29 NOTE — Progress Notes (Signed)
Preliminary notes--Bilateral lower extremities venous duplex exam completed. Age indeterminate thrombosis seen at right peroneal veins and great saphenous vein.  Otherwise other veins appear negative for DVT.  Result attempted multiple times to call ordering physician's office, no answers. Will e-fax to Dr. Irene Limbo. Christine Garrett (RDMS RVT) 01/29/18 11:46 AM

## 2018-02-04 ENCOUNTER — Other Ambulatory Visit: Payer: Self-pay | Admitting: Hematology

## 2018-02-04 ENCOUNTER — Telehealth: Payer: Self-pay

## 2018-02-04 MED ORDER — APIXABAN 5 MG PO TABS: 5.0000 mg | ORAL_TABLET | Freq: Two times a day (BID) | ORAL | 2 refills | Status: DC

## 2018-02-04 NOTE — Telephone Encounter (Signed)
Per Dr. Irene Limbo called patient to let her know that her ultrasound results showed age indeterminate RLE DVT and SVT. Informed patient that Dr. Irene Limbo sent a prescription for Eliquis to her pharmacy. Also informed patient to hold her Revlimid for now since that could be an additional risk factor per Dr. Irene Limbo. Patient repeated instructions and verbalized understanding.

## 2018-02-09 DIAGNOSIS — H35373 Puckering of macula, bilateral: Secondary | ICD-10-CM | POA: Diagnosis not present

## 2018-02-09 DIAGNOSIS — E119 Type 2 diabetes mellitus without complications: Secondary | ICD-10-CM | POA: Diagnosis not present

## 2018-02-09 DIAGNOSIS — Z7984 Long term (current) use of oral hypoglycemic drugs: Secondary | ICD-10-CM | POA: Diagnosis not present

## 2018-02-09 DIAGNOSIS — Z794 Long term (current) use of insulin: Secondary | ICD-10-CM | POA: Diagnosis not present

## 2018-02-10 ENCOUNTER — Ambulatory Visit (INDEPENDENT_AMBULATORY_CARE_PROVIDER_SITE_OTHER): Payer: Medicare Other | Admitting: Neurology

## 2018-02-10 ENCOUNTER — Encounter: Payer: Self-pay | Admitting: Neurology

## 2018-02-10 VITALS — BP 190/90 | HR 86 | Ht 69.5 in | Wt 205.0 lb

## 2018-02-10 DIAGNOSIS — E1142 Type 2 diabetes mellitus with diabetic polyneuropathy: Secondary | ICD-10-CM

## 2018-02-10 DIAGNOSIS — G620 Drug-induced polyneuropathy: Secondary | ICD-10-CM

## 2018-02-10 DIAGNOSIS — C9000 Multiple myeloma not having achieved remission: Secondary | ICD-10-CM | POA: Diagnosis not present

## 2018-02-10 DIAGNOSIS — G63 Polyneuropathy in diseases classified elsewhere: Secondary | ICD-10-CM | POA: Diagnosis not present

## 2018-02-10 MED ORDER — GABAPENTIN 300 MG PO CAPS: 900.0000 mg | ORAL_CAPSULE | Freq: Three times a day (TID) | ORAL | 11 refills | Status: DC

## 2018-02-10 NOTE — Progress Notes (Signed)
Christine Garrett   Date: 02/10/18  Christine Garrett MRN: [id number] DOB: September 21, 1955   Dear Dr. Irene Limbo:  Thank you for your kind referral of Christine Garrett for consultation of bilateral feet pain. Although her history is well known to you, please allow [place] to reiterate it for the purpose of our medical record. The patient was accompanied to the clinic by self.   History of Present Illness: Christine Garrett is a 62 y.o. right-handed African American female with multiple myeloma in remission (2018), hypertension, GERD, diabetes mellitus, and alcohol use presenting for evaluation of bilateral leg pain.    Patient was found to have IgG kappa multiple myeloma following a workup for weight loss and GI discomfort when he abdomen showed left T8 lytic lesion with extension into the canal with cord compression. MRI of the thoracic spine on 12/17/2016 confirmed a large destructive mass lesion at the left pedicle of T9 extending posteriorly into the vertebral body, transverse process, and lamina. PET scan showed increased uptake in the right proximal tibia, he T9, right acetabulum, and femoral head. In August 2018, bone marrow biopsy confirmed the diagnosis of multiple myeloma. She was treated with chemotherapy from August 2018 - November 2018 with bortezomib, lenalidomide, and dexamethasone.  She underwent autologous stem cell transplantation in January 2019.  She was on lenalidomide 5 mg daily until recently when the diagnosis of right DVT was found.  Starting in late 2018, she began having achy pain involving the legs which was episodic and alleviated with tylenol.  Over the past several months, pain has transitioned into more of a stabbing pain and numbness which involves her feet and worse on the right.  She also has some tingling involving her lower legs and rarely in the fingertips.  She falls frequently due to imbalance and uses a cane for long  distances. Within her home, she uses the walls and furniture for support. Upon further questioning, she admits to having numbness and tingling of the feet even prior to the diagnosis of multiple myeloma, but states that symptoms have significantly worsened over the past year.  She takes gabapentin 62m three times daily and cymbalta 629mdaily, which helps the tingling of her lower legs, unfortunately there is no improvement with stabbing pain in the feet.   She was drinking 6 back of beer daily for two year and cut back in June 2018 when diagnosed with multiple myeloma.  She currently drinks 2 beers nightly.  She has diabetes for many years which was poorly controlled.  Hemoglobin A1c from 2018 is 13, however she tells me it is much better controlled but is not aware of her current A1c.   Out-side paper records, electronic medical record, and images have been reviewed where available and summarized as:  MRI thoracic spine wwo contrast 12/17/2016:  Large destructive mass lesion centered in the left pedicle of T9 extends into the posterior aspect of the vertebral body, the left transverse process, facet and lamina and is consistent with metastatic disease or possibly myeloma. The lesion deflects the cord to the right and severely flattens it from the inferior aspect of T8 to the superior endplate of T1Y04Please note the discrepancy in the level of the lesion versus CT abdomen and pelvis is due to transitional lumbosacral anatomy.  Marked multilevel degenerative disc disease as described above.   Lab Results  Component Value Date   HGBA1C 13.3 (H) 12/26/2016     Past Medical History:  Diagnosis Date  . Arthritis    knees  . Cancer ([place])   . Carpal tunnel syndrome   . CHF (congestive heart failure) ([place])   . Diabetes mellitus    type 2  . GERD (gastroesophageal reflux disease)   . Heart murmur    "Little, No concerns" per Dr  Dannielle Burn pt reported.  . Hypertension    controlled using a guided  approch with plasma renin activity  . Multiple myeloma not having achieved remission ([place]) 01/06/2017  . Neuropathy   . Peripheral vascular disease ([place])   . Sleep apnea    Uses CPAP    Past Surgical History:  Procedure Laterality Date  . BIOPSY  11/28/2016   Procedure: BIOPSY;  Surgeon: Rogene Houston, MD;  [facility];  Service: Endoscopy;;  gastric  . [place]  . CERVICAL CONE BIOPSY     cervical lesion  . ESOPHAGOGASTRODUODENOSCOPY (EGD) WITH PROPOFOL N/A 11/28/2016   Procedure: ESOPHAGOGASTRODUODENOSCOPY (EGD) WITH PROPOFOL;  Surgeon: Rogene Houston, MD;  [facility];  Service: Endoscopy;  Laterality: N/A;  9:25  . IR FLUORO GUIDE PORT INSERTION RIGHT  01/29/2017  . IR US GUIDE VASC ACCESS RIGHT  01/29/2017  . lipoma removal     right shoulder 2001  . MULTIPLE EXTRACTIONS WITH ALVEOLOPLASTY Bilateral 08/24/2012   Procedure: MULTIPLE EXTRACTION WITH ALVEOLOPLASTY BIOPSY OF RIGHT AND LEFT MANDIBLE ;  Surgeon: Gae Bon, DDS;  Location: [place];  Service: Oral Surgery;  Laterality: Bilateral;  . POSTERIOR LUMBAR FUSION 4 LEVEL N/A 12/26/2016   Procedure: THORACIC EIGHT TUMOR RESECTION, THORACIC SIX- THORACIC TEN POSTERIOR SPINAL FUSION;  Surgeon: Ashok Pall, MD;  Location: [place];  Service: Neurosurgery;  Laterality: N/A;  THORACIC 8 TUMOR RESECTION, THORACIC 6- THORACIC 10 POSTERIOR SPINAL FUSION  . ROTATOR CUFF REPAIR     right shoulder  . TUBAL LIGATION       Medications:  Outpatient Encounter Medications as of 02/10/2018  Medication Sig  . acyclovir (ZOVIRAX) 400 MG tablet Take 1 tablet (400 mg total) by mouth 2 (two) times daily.  Marland Kitchen apixaban (ELIQUIS) 5 MG TABS tablet Take 1 tablet (5 mg total) by mouth 2 (two) times daily.  . chlorthalidone (HYGROTON) 25 MG tablet Take 25 mg by mouth 2 (two) times daily.   Marland Kitchen diltiazem (CARDIZEM CD) 300 MG 24 hr capsule Take 321ms by mouth daily  . DULoxetine (CYMBALTA) 60 MG capsule Take 60 mg by mouth 2  (two) times daily.  . enalapril (VASOTEC) 20 MG tablet Take 20 mg by mouth 2 (two) times daily.   . insulin degludec (TRESIBA FLEXTOUCH) 100 UNIT/ML SOPN FlexTouch Pen Inject 0.5 mLs (50 Units total) into the skin daily at 10 pm.  . Insulin Pen Needle (B-D ULTRAFINE III SHORT PEN) 31G X 8 MM MISC 1 each by Does not apply route as directed.  .Marland Kitchenlenalidomide (REVLIMID) 5 MG capsule Take 1 capsule (5 mg total) by mouth daily. 3 weeks on 1 week off.  Auth # [id number] . lidocaine-prilocaine (EMLA) cream Apply to affected area once  . metFORMIN (GLUCOPHAGE) 1000 MG tablet Take 1,000 mg by mouth 2 (two) times daily.   .Marland KitchentiZANidine (ZANAFLEX) 4 MG tablet Take 1 tablet (4 mg total) by mouth every 6 (six) hours as needed for muscle spasms.  . Vitamin D, Ergocalciferol, (DRISDOL) 50000 units CAPS capsule Take by mouth.  . gabapentin (NEURONTIN) 300 MG capsule Take 3 capsules (900  mg total) by mouth 3 (three) times daily.  . [DISCONTINUED] folic acid (FOLVITE) 1 MG tablet Take 1 mg by mouth daily.  . [DISCONTINUED] gabapentin (NEURONTIN) 300 MG capsule Take 1 capsule (300 mg total) by mouth 3 (three) times daily. (Patient taking differently: Take 600 mg by mouth 3 (three) times daily. )  . [DISCONTINUED] KLOR-CON M20 20 MEQ tablet TAKE 1 TABLET BY MOUTH EVERY DAY  . [DISCONTINUED] omeprazole (PRILOSEC) 20 MG capsule Take 1 capsule (20 mg total) by mouth 2 (two) times daily before a meal.  . [DISCONTINUED] sulfamethoxazole-trimethoprim (BACTRIM DS,SEPTRA DS) 800-160 MG tablet Take 1 tablet by mouth daily.   No facility-administered encounter medications on file as of 02/10/2018.      Allergies:  Allergies  Allergen Reactions  . Hydrocodone Other (See Comments)    MENTAL STATUS CHANGE  MENTAL STATUS CHANGE   (Pt states she is not allergic to Hydrocodone, she was recently taking it) MENTAL STATUS CHANGE    . No Known Allergies     Family History: Family History  Problem Relation Age of Onset  .  Diabetes Mother   . Hypertension Mother   . Heart disease Mother   . Alzheimer's disease Mother   . Diabetes Father   . Heart disease Sister   . Diabetes Sister     Social History: Social History   Tobacco Use  . Smoking status: Former Smoker    Packs/day: 0.50    Years: 6.00    Pack years: 3.00    Types: Cigarettes    Last attempt to quit: 11/23/2011    Years since quitting: 6.2  . Smokeless tobacco: Never Used  . Tobacco comment: quit summer 2013  Substance Use Topics  . Alcohol use: No    Alcohol/week: 6.0 standard drinks    Types: 6 Cans of beer per week  . Drug use: No   Social History   Social History Narrative   Lives with son, daughter and grandkids in a 2 story home but stays on the first floor.  Has 3 children.  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    Review of Systems:  CONSTITUTIONAL: No fevers, chills, night sweats, or weight loss.   EYES: No visual changes or eye pain ENT: No hearing changes.  No history of nose bleeds.   RESPIRATORY: No cough, wheezing and shortness of breath.   CARDIOVASCULAR: Negative for chest pain, and palpitations.   GI: Negative for abdominal discomfort, blood in stools or black stools.  No recent change in bowel habits.   GU:  No history of incontinence.   MUSCLOSKELETAL: +history of joint pain +swelling.  No myalgias.   SKIN: Negative for lesions, rash, and itching.   HEMATOLOGY/ONCOLOGY: Negative for prolonged bleeding, bruising easily, and swollen nodes.  +history of cancer.   ENDOCRINE: Negative for cold or heat intolerance, polydipsia or goiter.   PSYCH:  No depression or anxiety symptoms.   NEURO: As Above.   Vital Signs:  BP (!) 190/90   Pulse 86   Ht 5' 9.5" (1.765 m)   Wt 205 lb (93 kg)   SpO2 99%   BMI 29.84 kg/m    General Medical Exam:   General:  Well appearing, comfortable.   Eyes/ENT: see cranial nerve examination.  Edentulous Neck: No masses appreciated.  Full range of  motion without tenderness.  No carotid bruits. Respiratory:  Clear to auscultation, good air entry bilaterally.   Cardiac:  Regular rate and rhythm, no murmur.   Extremities:  No deformities.  Pitting edema of feet bilaterally.   Skin:  No rashes or lesions.  Neurological Exam: MENTAL STATUS including orientation to time, place, person, recent and remote memory, attention span and concentration, language, and fund of knowledge is normal.  Speech is not dysarthric.  CRANIAL NERVES: II:  No visual field defects.  Unremarkable fundi.   III-IV-VI: Pupils equal round and reactive to light.  Normal conjugate, extra-ocular eye movements in all directions of gaze.  No nystagmus.  No ptosis.   V:  Normal facial sensation.    VII:  Normal facial symmetry and movements.  No pathologic facial reflexes.  VIII:  Normal hearing and vestibular function.   IX-X:  Normal palatal movement.   XI:  Normal shoulder shrug and head rotation.   XII:  Normal tongue strength and range of motion, no deviation or fasciculation.  MOTOR:  No atrophy, fasciculations or abnormal movements.  No pronator drift.  Tone is normal.    Right Upper Extremity:    Left Upper Extremity:    Deltoid  5/5   Deltoid  5/5   Biceps  5/5   Biceps  5/5   Triceps  5/5   Triceps  5/5   Wrist extensors  5/5   Wrist extensors  5/5   Wrist flexors  5/5   Wrist flexors  5/5   Finger extensors  5/5   Finger extensors  5/5   Finger flexors  5/5   Finger flexors  5/5   Dorsal interossei  5-/5   Dorsal interossei  5-/5   Abductor pollicis  5/5   Abductor pollicis  5/5   Tone (Ashworth scale)  0  Tone (Ashworth scale)  0   Right Lower Extremity:    Left Lower Extremity:    Hip flexors  5/5   Hip flexors  5/5   Hip extensors  5/5   Hip extensors  5/5   Knee flexors  5/5   Knee flexors  5/5   Knee extensors  5/5   Knee extensors  5/5   Dorsiflexors  5/5   Dorsiflexors  5/5   Plantarflexors  5/5   Plantarflexors  5/5   Toe extensors  5/5    Toe extensors  5/5   Toe flexors  5/5   Toe flexors  5/5   Tone (Ashworth scale)  0  Tone (Ashworth scale)  0   MSRs:  Right                                                                 Left brachioradialis 3+  brachioradialis 3+  biceps 3+  biceps 3+  triceps 2+  triceps 2+  patellar 2+  patellar 2+  ankle jerk 0  ankle jerk 0  Hoffman no  Hoffman no  plantar response down  plantar response down   SENSORY:  Vibration is absent at the right ankle and great toe, reduced to 50% at the right knee and distal to the left ankle. Temperature and pinprick is also reduced in a gradient fashion below the knee on the right and mid cough on the left. Romberg sign is positive.  COORDINATION/GAIT: Normal finger-to- nose-finger.  Intact rapid alternating movements bilaterally.  Gait appears slow, wide-based, and assisted with cane.   IMPRESSION: Painful peripheral neuropathy due to diabetes and alcohol, worsened after diagnosis of myeloma and chemotherapy with bortezomib.  She has risk factors which can contribute to neuropathy including history of poorly controlled diabetes and alcohol use, which I discussed. I do suspect that her neuropathy was worsened by bortezomib and this may be her new baseline. Her neurological examination shows a distal predominant large fiber peripheral neuropathy, worse on the right. I had extensive discussion with the patient regarding the pathogenesis, etiology, management, and natural course of neuropathy.  To prevent progression, it is important for diabetes to be well controlled as well as alcohol cessation. She expresses no interest at this time to stop drinking beer and has cut back to drinking a few nights per week instead of nightly.  Because of the asymmetry of her symptoms, I will check electrodiagnostic testing to evaluate for polyradiculoneuropathy which can be seen with drug-induced neuropathies.  Labs looking for secondary causes of neuropathy will also be checked.   Goal at this time is controlling pain and preventing falls.  Reflexes are brisk in the arms, indicating possible cervical canal stenosis.  She currently denies any radicular pain. Going forward, MRI of the cervical spine can be considered.   PLAN/RECOMMENDATIONS:  NCS/EMG of the right arm and leg Check vitamin B12, vitamin B1, folate, copper, HbA1c, TSH Increase gabapentin to 920m three times daily Continue Cymbalta 680mdaily Fall precautions discussed, encouraged to always use a cane.   She does complain daily foot inspection She is not interested in cutting back on alcohol consumption   Return to clinic in 3 months.  Thank you for allowing me to participate in patient's care.  If I can answer any additional questions, I would be pleased to do so.    Sincerely,    Britney Newstrom K. PaPosey ProntoDO

## 2018-02-10 NOTE — Patient Instructions (Addendum)
Recheck blood pressure  Check labs  NCS/EMG of the right arm and leg.  Please do not apply lotion on the right arm or leg on the day of testing.  Increase gabapentin to 900mg  three times daily  Always use cane  Return to clinic in 4 months   [place] (EMG/NCS) INSTRUCTIONS  How to Prepare The neurologist conducting the EMG will need to know if you have certain medical conditions. Tell the neurologist and other EMG lab personnel if you: . Have a pacemaker or any other electrical medical device . Take blood-thinning medications . Have hemophilia, a blood-clotting disorder that causes prolonged bleeding Bathing Take a shower or bath shortly before your exam in order to remove oils from your skin. Don't apply lotions or creams before the exam.  What to Expect You'll likely be asked to change into a hospital gown for the procedure and lie down on an examination table. The following explanations can help you understand what will happen during the exam.  . Electrodes. The neurologist or a technician places surface electrodes at various locations on your skin depending on where you're experiencing symptoms. Or the neurologist may insert needle electrodes at different sites depending on your symptoms.  . Sensations. The electrodes will at times transmit a tiny electrical current that you may feel as a twinge or spasm. The needle electrode may cause discomfort or pain that usually ends shortly after the needle is removed. If you are concerned about discomfort or pain, you may want to talk to the neurologist about taking a short break during the exam.  . Instructions. During the needle EMG, the neurologist will assess whether there is any spontaneous electrical activity when the muscle is at rest - activity that isn't present in healthy muscle tissue - and the degree of activity when you slightly contract the muscle.  He or she will give you instructions on resting  and contracting a muscle at appropriate times. Depending on what muscles and nerves the neurologist is examining, he or she may ask you to change positions during the exam.  After your EMG You may experience some temporary, minor bruising where the needle electrode was inserted into your muscle. This bruising should fade within several days. If it persists, contact your primary care doctor.

## 2018-02-19 ENCOUNTER — Other Ambulatory Visit: Payer: Self-pay

## 2018-02-19 DIAGNOSIS — C9001 Multiple myeloma in remission: Secondary | ICD-10-CM

## 2018-02-20 ENCOUNTER — Other Ambulatory Visit: Payer: Self-pay | Admitting: Neurology

## 2018-02-20 ENCOUNTER — Inpatient Hospital Stay: Payer: Medicare Other

## 2018-02-20 ENCOUNTER — Inpatient Hospital Stay: Payer: Medicare Other | Attending: Hematology and Oncology

## 2018-02-20 DIAGNOSIS — C9001 Multiple myeloma in remission: Secondary | ICD-10-CM

## 2018-02-20 DIAGNOSIS — G63 Polyneuropathy in diseases classified elsewhere: Secondary | ICD-10-CM | POA: Diagnosis not present

## 2018-02-20 DIAGNOSIS — C9000 Multiple myeloma not having achieved remission: Secondary | ICD-10-CM | POA: Diagnosis not present

## 2018-02-20 DIAGNOSIS — E1142 Type 2 diabetes mellitus with diabetic polyneuropathy: Secondary | ICD-10-CM | POA: Diagnosis not present

## 2018-02-20 DIAGNOSIS — G620 Drug-induced polyneuropathy: Secondary | ICD-10-CM | POA: Diagnosis not present

## 2018-02-20 LAB — RETICULOCYTES
RBC.: 3.67 MIL/uL — ABNORMAL LOW (ref 3.70–5.45)
RETIC CT PCT: 2.2 % — AB (ref 0.7–2.1)
Retic Count, Absolute: 80.7 10*3/uL (ref 33.7–90.7)

## 2018-02-20 LAB — CBC WITH DIFFERENTIAL (CANCER CENTER ONLY)
Basophils Absolute: 0 10*3/uL (ref 0.0–0.1)
Basophils Relative: 1 %
EOS PCT: 4 %
Eosinophils Absolute: 0.2 10*3/uL (ref 0.0–0.5)
HCT: 35.3 % (ref 34.8–46.6)
Hemoglobin: 11.6 g/dL (ref 11.6–15.9)
LYMPHS ABS: 2.1 10*3/uL (ref 0.9–3.3)
LYMPHS PCT: 48 %
MCH: 32.2 pg (ref 25.1–34.0)
MCHC: 32.8 g/dL (ref 31.5–36.0)
MCV: 98 fL (ref 79.5–101.0)
MONO ABS: 0.4 10*3/uL (ref 0.1–0.9)
MONOS PCT: 9 %
Neutro Abs: 1.6 10*3/uL (ref 1.5–6.5)
Neutrophils Relative %: 38 %
PLATELETS: 203 10*3/uL (ref 145–400)
RBC: 3.6 MIL/uL — AB (ref 3.70–5.45)
RDW: 15.3 % — ABNORMAL HIGH (ref 11.2–14.5)
WBC: 4.2 10*3/uL (ref 3.9–10.3)

## 2018-02-20 MED ORDER — SODIUM CHLORIDE 0.9 % IV SOLN
Freq: Once | INTRAVENOUS | Status: AC
  Administered 2018-02-20: 15:00:00 via INTRAVENOUS
  Filled 2018-02-20: qty 250

## 2018-02-20 MED ORDER — SODIUM CHLORIDE 0.9% FLUSH
10.0000 mL | Freq: Once | INTRAVENOUS | Status: AC | PRN
  Administered 2018-02-20: 10 mL
  Filled 2018-02-20: qty 10

## 2018-02-20 MED ORDER — SODIUM CHLORIDE 0.9% FLUSH
10.0000 mL | Freq: Once | INTRAVENOUS | Status: DC | PRN
  Filled 2018-02-20: qty 10

## 2018-02-20 MED ORDER — HEPARIN SOD (PORK) LOCK FLUSH 100 UNIT/ML IV SOLN
500.0000 [IU] | Freq: Once | INTRAVENOUS | Status: DC | PRN
  Filled 2018-02-20: qty 5

## 2018-02-20 MED ORDER — ZOLEDRONIC ACID 4 MG/100ML IV SOLN
4.0000 mg | Freq: Once | INTRAVENOUS | Status: AC
  Administered 2018-02-20: 4 mg via INTRAVENOUS
  Filled 2018-02-20: qty 100

## 2018-02-20 NOTE — Patient Instructions (Signed)
[facility] Discharge Instructions for Patients Receiving Chemotherapy  Today you received the following chemotherapy agents: Zometa (zoledronic acid)  To help prevent nausea and vomiting after your treatment, we encourage you to take your nausea medication as directed.   If you develop nausea and vomiting that is not controlled by your nausea medication, call the clinic.   BELOW ARE SYMPTOMS THAT SHOULD BE REPORTED IMMEDIATELY:  *FEVER GREATER THAN 100.5 F  *CHILLS WITH OR WITHOUT FEVER  NAUSEA AND VOMITING THAT IS NOT CONTROLLED WITH YOUR NAUSEA MEDICATION  *UNUSUAL SHORTNESS OF BREATH  *UNUSUAL BRUISING OR BLEEDING  TENDERNESS IN MOUTH AND THROAT WITH OR WITHOUT PRESENCE OF ULCERS  *URINARY PROBLEMS  *BOWEL PROBLEMS  UNUSUAL RASH Items with * indicate a potential emergency and should be followed up as soon as possible.  Feel free to call the clinic should you have any questions or concerns. The clinic phone number is [phone].  Please show the [facility] at check-in to the Emergency Department and triage nurse.

## 2018-02-23 LAB — MULTIPLE MYELOMA PANEL, SERUM
ALBUMIN SERPL ELPH-MCNC: 3.6 g/dL (ref 2.9–4.4)
ALPHA 1: 0.2 g/dL (ref 0.0–0.4)
Albumin/Glob SerPl: 1.4 (ref 0.7–1.7)
Alpha2 Glob SerPl Elph-Mcnc: 0.7 g/dL (ref 0.4–1.0)
B-Globulin SerPl Elph-Mcnc: 1 g/dL (ref 0.7–1.3)
GAMMA GLOB SERPL ELPH-MCNC: 0.8 g/dL (ref 0.4–1.8)
Globulin, Total: 2.6 g/dL (ref 2.2–3.9)
IGA: 108 mg/dL (ref 87–352)
IgG (Immunoglobin G), Serum: 915 mg/dL (ref 700–1600)
IgM (Immunoglobulin M), Srm: 54 mg/dL (ref 26–217)
Total Protein ELP: 6.2 g/dL (ref 6.0–8.5)

## 2018-02-23 LAB — KAPPA/LAMBDA LIGHT CHAINS
KAPPA FREE LGHT CHN: 14.5 mg/L (ref 3.3–19.4)
Kappa, lambda light chain ratio: 1.49 (ref 0.26–1.65)
LAMDA FREE LIGHT CHAINS: 9.7 mg/L (ref 5.7–26.3)

## 2018-02-26 ENCOUNTER — Ambulatory Visit (INDEPENDENT_AMBULATORY_CARE_PROVIDER_SITE_OTHER): Payer: Medicare Other | Admitting: Neurology

## 2018-02-26 DIAGNOSIS — C9000 Multiple myeloma not having achieved remission: Secondary | ICD-10-CM | POA: Diagnosis not present

## 2018-02-26 DIAGNOSIS — G620 Drug-induced polyneuropathy: Secondary | ICD-10-CM

## 2018-02-26 DIAGNOSIS — E1142 Type 2 diabetes mellitus with diabetic polyneuropathy: Secondary | ICD-10-CM

## 2018-02-26 DIAGNOSIS — G63 Polyneuropathy in diseases classified elsewhere: Secondary | ICD-10-CM | POA: Diagnosis not present

## 2018-02-26 NOTE — Procedures (Signed)
[facility] Neurology  [address], [address]  [place], [place] [address] Tel: [phone] Fax:  [phone] Test Date:  02/26/2018  Patient: Christine Garrett DOB: 1955-12-17 Physician: Narda Amber, DO  Sex: Female Height: _0  Ref Phys: Narda Amber, DO  ID#: [id number] Temp: 34.0C Technician:    Patient Complaints: This is a 62 year old female with history of multiple myeloma, diabetes, and alcohol use who referred for evaluation of numbness and tingling of the hands and feet.  NCV & EMG Findings: Extensive electrodiagnostic testing of the right upper and lower extremity shows:  1. Right median sensory response shows prolonged latency (5.1 ms) and reduced amplitude (6.6 V).  Right ulnar sensory response shows prolonged distal peak latency (3.3 ms) and normal amplitude. Right radial sensory response shows reduced amplitude (12.4 V). 2. Right sural and superficial peroneal sensory responses are absent. 3. Right median motor response shows prolonged latency (5.2) and normal amplitude; there is evidence of a median to ulnar crossover in the forearm has seen by a motor response when stimulating at the ulnar wrist, consistent with a Martin-Gruber anastomosis. Right ulnar motor response shows mildly prolonged latency and conduction velocity slowing across the elbow. 4. Right peroneal motor response is absent at the extensor digitorum brevis, and within normal limits at the tibialis anterior. Right tibial motor response is absent. 5. Right ulnar F wave is normal. Tibial F and H reflexes studies are absent. 6. Chronic motor axon loss changes are seen affecting the muscles below the knee, as well as the distal hand muscles. Active denervation is not present in any of the tested muscles.   Impression: The electrophysiologic findings are most consistent with a chronic, length-dependent sensorimotor axonal and demyelinating polyneuropathy affecting the right side. Overall, these findings are  moderate to severe in degree electrically.  Incidentally, there is a right Martin-Gruber anastomosis, a normal variant.   ___________________________ Narda Amber, DO    Nerve Conduction Studies Anti Sensory Summary Table   Site NR Peak (ms) Norm Peak (ms) P-T Amp (V) Norm P-T Amp  Right Median Anti Sensory (2nd Digit)  34C  Wrist    5.1 <3.6 6.6 >15  Right Radial Anti Sensory (Base 1st Digit)  34C  Wrist    2.6 <2.7 12.4 >14  Right Sup Peroneal Anti Sensory (Ant Lat Mall)  34C  12 cm NR  <4.6  >4  Right Sural Anti Sensory (Lat Mall)  34C  Calf NR  <4.6  >4  Right Ulnar Anti Sensory (5th Digit)  34C  Wrist    3.3 <3.1 18.2 >10   Motor Summary Table   Site NR Onset (ms) Norm Onset (ms) O-P Amp (mV) Norm O-P Amp Site1 Site2 Delta-0 (ms) Dist (cm) Vel (m/s) Norm Vel (m/s)  Right Median Motor (Abd Poll Brev)  34C  Wrist    5.2 <4.0 10.0 >6 Elbow Wrist 6.2 32.0 52 >50  Elbow    11.4  9.7  Ulnar-crossover Elbow 5.9 0.0    Ulnar-crossover    5.5  2.6         Right Peroneal Motor (Ext Dig Brev)  34C  Ankle NR  <6.0  >2.5 B Fib Ankle  0.0  >40  B Fib NR     Poplt B Fib  0.0  >40  Poplt NR            Right Peroneal TA Motor (Tib Ant)  34C  Fib Head    3.5 <4.5 3.9 >3 Poplit Fib  Head 1.6 9.0 56 >40  Poplit    5.1  3.8         Right Tibial Motor (Abd Hall Brev)  34C  Ankle NR  <6.0  >4 Knee Ankle  0.0  >40  Knee NR            Right Ulnar Motor (Abd Dig Minimi)  34C  Wrist    3.2 <3.1 8.6 >7 B Elbow Wrist 4.2 25.0 60 >50  B Elbow    7.4  8.4  A Elbow B Elbow 2.3 10.0 43 >50  A Elbow    9.7  7.9          F Wave Studies   NR F-Lat (ms) Lat Norm (ms) L-R F-Lat (ms)  Right Tibial (Mrkrs) (Abd Hallucis)  34C  NR  <55   Right Ulnar (Mrkrs) (Abd Dig Min)  34C     32.60 <33    H Reflex Studies   NR H-Lat (ms) Lat Norm (ms) L-R H-Lat (ms)  Right Tibial (Gastroc)  34C  NR  <35    EMG   Side Muscle Ins Act Fibs Psw Fasc Number Recrt Dur Dur. Amp Amp. Poly Poly.  Comment  Right AntTibialis Nml Nml Nml Nml 1- Rapid Many 1+ Many 1+ Many 1+ N/A  Right Gastroc Nml Nml Nml Nml 2- Rapid Many 1+ Many 1+ Many 1+ N/A  Right RectFemoris Nml Nml Nml Nml 1- Rapid Some 1+ Some 1+ Nml Nml N/A  Right BicepsFemS Nml Nml Nml Nml 1- Mod-R Some 1+ Some 1+ Some 1+ N/A  Right Gluteus Medius _0  _1  Nml Nml N/A  Right 1stDorInt Nml Nml Nml Nml 1- Rapid Few 1+ Few 1+ Few 1+ N/A  Right Abd Poll Brev Nml Nml Nml Nml 1- Rapid Few 1+ Few 1+ Nml Nml N/A  Right Ext Indicis Nml Nml Nml Nml 1- Rapid Few 1+ Few 1+ Nml Nml N/A  Right PronatorTeres _2  _3  Nml Nml N/A  Right Biceps _4  _5  Nml Nml N/A  Right Triceps _6  _7  Nml Nml N/A  Right Deltoid _8  _9  Nml Nml N/A      Waveforms:

## 2018-02-27 ENCOUNTER — Telehealth: Payer: Self-pay | Admitting: *Deleted

## 2018-02-27 LAB — TSH: TSH: 0.592 u[IU]/mL (ref 0.450–4.500)

## 2018-02-27 LAB — HGB A1C W/O EAG: Hgb A1c MFr Bld: 4.9 % (ref 4.8–5.6)

## 2018-02-27 LAB — COPPER, SERUM: Copper: 88 ug/dL (ref 72–166)

## 2018-02-27 LAB — FOLATE: FOLATE: 3.4 ng/mL (ref >3.0)

## 2018-02-27 LAB — SPECIMEN STATUS REPORT

## 2018-02-27 LAB — VITAMIN B12: VITAMIN B 12: 233 pg/mL (ref 232–1245)

## 2018-02-27 NOTE — Telephone Encounter (Signed)
Labs 02/20/2018:  Folate 3.4 (normal >3.0), HbA1c 4.9, TSH 0.592, vitamin B12 233 (normal >232), copper 88  Vitamin B1 results are not available.  Please inform patient that her folate and vitamin B12 levels are low and I recommend that she start folic acid 1mg  daily and vitamin B12 1027mcg IM injection daily x 7 days, weekly x 4 weeks, then monthly thereafter x 1 year.  Also, follow-up on vitamin B1.  Thanks.  Donika K. Posey Pronto, DO

## 2018-02-27 NOTE — Telephone Encounter (Signed)
-----   Message from Alda Berthold, DO sent at 02/26/2018  6:25 PM EDT ----- Please follow-up in patient's labs.  She had them drawn at the cancer center and they still are active in [place].  If lab does not have them, she will be getting labs done next week and they can be reordered.

## 2018-02-27 NOTE — Telephone Encounter (Signed)
Labs received

## 2018-03-03 NOTE — Telephone Encounter (Signed)
Patient given results and instructions.  She will start injections on Monday.

## 2018-03-09 ENCOUNTER — Telehealth: Payer: Self-pay

## 2018-03-09 NOTE — Telephone Encounter (Signed)
Received phone call from [place] at [facility]. Returned call to pharmacy and spoke with Vivien Rota, Therapist, sports. Revlimid being held until f/u with Dr. Irene Limbo 03/18/18. Decision to restart will be determined at that time.

## 2018-03-12 ENCOUNTER — Telehealth: Payer: Self-pay | Admitting: *Deleted

## 2018-03-12 NOTE — Telephone Encounter (Signed)
B1 did not go through.  Will order again on Monday.

## 2018-03-12 NOTE — Telephone Encounter (Signed)
-----   Message from Alda Berthold, DO sent at 03/12/2018  2:23 PM EDT ----- Please follow-up on her vitamin B1 level. Thanks.

## 2018-03-18 ENCOUNTER — Inpatient Hospital Stay: Payer: Medicare Other

## 2018-03-18 ENCOUNTER — Inpatient Hospital Stay: Payer: Medicare Other | Admitting: Hematology

## 2018-03-18 DIAGNOSIS — I5033 Acute on chronic diastolic (congestive) heart failure: Secondary | ICD-10-CM | POA: Diagnosis not present

## 2018-03-18 DIAGNOSIS — I110 Hypertensive heart disease with heart failure: Secondary | ICD-10-CM | POA: Diagnosis not present

## 2018-03-18 DIAGNOSIS — Z794 Long term (current) use of insulin: Secondary | ICD-10-CM | POA: Diagnosis not present

## 2018-03-18 DIAGNOSIS — I959 Hypotension, unspecified: Secondary | ICD-10-CM | POA: Diagnosis not present

## 2018-03-18 DIAGNOSIS — R0902 Hypoxemia: Secondary | ICD-10-CM | POA: Diagnosis not present

## 2018-03-18 DIAGNOSIS — Z7901 Long term (current) use of anticoagulants: Secondary | ICD-10-CM | POA: Diagnosis not present

## 2018-03-18 DIAGNOSIS — Z8249 Family history of ischemic heart disease and other diseases of the circulatory system: Secondary | ICD-10-CM | POA: Diagnosis not present

## 2018-03-18 DIAGNOSIS — Z8579 Personal history of other malignant neoplasms of lymphoid, hematopoietic and related tissues: Secondary | ICD-10-CM | POA: Diagnosis not present

## 2018-03-18 DIAGNOSIS — Z79899 Other long term (current) drug therapy: Secondary | ICD-10-CM | POA: Diagnosis not present

## 2018-03-18 DIAGNOSIS — I480 Paroxysmal atrial fibrillation: Secondary | ICD-10-CM | POA: Diagnosis not present

## 2018-03-18 DIAGNOSIS — E1140 Type 2 diabetes mellitus with diabetic neuropathy, unspecified: Secondary | ICD-10-CM | POA: Diagnosis present

## 2018-03-18 DIAGNOSIS — R0602 Shortness of breath: Secondary | ICD-10-CM | POA: Diagnosis not present

## 2018-03-18 DIAGNOSIS — I509 Heart failure, unspecified: Secondary | ICD-10-CM | POA: Diagnosis not present

## 2018-03-18 DIAGNOSIS — R000 Tachycardia, unspecified: Secondary | ICD-10-CM | POA: Diagnosis not present

## 2018-03-18 DIAGNOSIS — Z86718 Personal history of other venous thrombosis and embolism: Secondary | ICD-10-CM | POA: Diagnosis not present

## 2018-03-18 DIAGNOSIS — R0689 Other abnormalities of breathing: Secondary | ICD-10-CM | POA: Diagnosis not present

## 2018-03-18 DIAGNOSIS — Z833 Family history of diabetes mellitus: Secondary | ICD-10-CM | POA: Diagnosis not present

## 2018-03-18 DIAGNOSIS — R079 Chest pain, unspecified: Secondary | ICD-10-CM | POA: Diagnosis not present

## 2018-03-19 ENCOUNTER — Telehealth: Payer: Self-pay | Admitting: Hematology

## 2018-03-19 NOTE — Telephone Encounter (Signed)
Scheduled appt per 10/2 sh message - left message and sent reminder letter in the mail .

## 2018-03-25 ENCOUNTER — Telehealth: Payer: Self-pay | Admitting: *Deleted

## 2018-03-25 DIAGNOSIS — Z7983 Long term (current) use of bisphosphonates: Secondary | ICD-10-CM | POA: Diagnosis not present

## 2018-03-25 DIAGNOSIS — Z48298 Encounter for aftercare following other organ transplant: Secondary | ICD-10-CM | POA: Diagnosis not present

## 2018-03-25 DIAGNOSIS — G629 Polyneuropathy, unspecified: Secondary | ICD-10-CM | POA: Diagnosis not present

## 2018-03-25 DIAGNOSIS — I509 Heart failure, unspecified: Secondary | ICD-10-CM | POA: Diagnosis not present

## 2018-03-25 DIAGNOSIS — I82501 Chronic embolism and thrombosis of unspecified deep veins of right lower extremity: Secondary | ICD-10-CM | POA: Diagnosis not present

## 2018-03-25 DIAGNOSIS — I110 Hypertensive heart disease with heart failure: Secondary | ICD-10-CM | POA: Diagnosis not present

## 2018-03-25 DIAGNOSIS — I10 Essential (primary) hypertension: Secondary | ICD-10-CM | POA: Diagnosis not present

## 2018-03-25 DIAGNOSIS — G9009 Other idiopathic peripheral autonomic neuropathy: Secondary | ICD-10-CM | POA: Diagnosis not present

## 2018-03-25 DIAGNOSIS — Z9484 Stem cells transplant status: Secondary | ICD-10-CM | POA: Diagnosis not present

## 2018-03-25 DIAGNOSIS — Z79899 Other long term (current) drug therapy: Secondary | ICD-10-CM | POA: Diagnosis not present

## 2018-03-25 DIAGNOSIS — Z23 Encounter for immunization: Secondary | ICD-10-CM | POA: Diagnosis not present

## 2018-03-25 DIAGNOSIS — C9000 Multiple myeloma not having achieved remission: Secondary | ICD-10-CM | POA: Diagnosis not present

## 2018-03-25 DIAGNOSIS — Z7902 Long term (current) use of antithrombotics/antiplatelets: Secondary | ICD-10-CM | POA: Diagnosis not present

## 2018-03-25 DIAGNOSIS — R0789 Other chest pain: Secondary | ICD-10-CM | POA: Diagnosis not present

## 2018-03-25 DIAGNOSIS — E559 Vitamin D deficiency, unspecified: Secondary | ICD-10-CM | POA: Diagnosis not present

## 2018-03-25 DIAGNOSIS — C9001 Multiple myeloma in remission: Secondary | ICD-10-CM | POA: Diagnosis not present

## 2018-03-25 DIAGNOSIS — I82401 Acute embolism and thrombosis of unspecified deep veins of right lower extremity: Secondary | ICD-10-CM | POA: Diagnosis not present

## 2018-03-25 NOTE — Telephone Encounter (Signed)
Received VM from [place] at [facility] seeking updated info on status of Revlimid RX - Hold or Restart. Called back, spoke with Melissa. Advised that next patient appt 04/02/18 and that decision will be discussed then

## 2018-03-26 ENCOUNTER — Telehealth: Payer: Self-pay

## 2018-03-26 DIAGNOSIS — I10 Essential (primary) hypertension: Secondary | ICD-10-CM | POA: Diagnosis not present

## 2018-03-26 DIAGNOSIS — E1149 Type 2 diabetes mellitus with other diabetic neurological complication: Secondary | ICD-10-CM | POA: Diagnosis not present

## 2018-03-26 DIAGNOSIS — I5042 Chronic combined systolic (congestive) and diastolic (congestive) heart failure: Secondary | ICD-10-CM | POA: Diagnosis not present

## 2018-03-26 DIAGNOSIS — J449 Chronic obstructive pulmonary disease, unspecified: Secondary | ICD-10-CM | POA: Diagnosis not present

## 2018-03-26 DIAGNOSIS — E7849 Other hyperlipidemia: Secondary | ICD-10-CM | POA: Diagnosis not present

## 2018-03-26 DIAGNOSIS — Z0000 Encounter for general adult medical examination without abnormal findings: Secondary | ICD-10-CM | POA: Diagnosis not present

## 2018-03-26 DIAGNOSIS — C9001 Multiple myeloma in remission: Secondary | ICD-10-CM | POA: Diagnosis not present

## 2018-03-26 NOTE — Telephone Encounter (Signed)
Error

## 2018-04-01 DIAGNOSIS — E559 Vitamin D deficiency, unspecified: Secondary | ICD-10-CM | POA: Insufficient documentation

## 2018-04-02 ENCOUNTER — Telehealth: Payer: Self-pay | Admitting: Hematology

## 2018-04-02 ENCOUNTER — Inpatient Hospital Stay: Payer: Medicare Other | Attending: Hematology and Oncology

## 2018-04-02 ENCOUNTER — Inpatient Hospital Stay: Payer: Medicare Other

## 2018-04-02 ENCOUNTER — Inpatient Hospital Stay (HOSPITAL_BASED_OUTPATIENT_CLINIC_OR_DEPARTMENT_OTHER): Payer: Medicare Other | Admitting: Hematology

## 2018-04-02 VITALS — BP 177/95 | HR 82 | Temp 98.4°F | Resp 20 | Ht 69.5 in | Wt 216.6 lb

## 2018-04-02 DIAGNOSIS — Z7901 Long term (current) use of anticoagulants: Secondary | ICD-10-CM | POA: Diagnosis not present

## 2018-04-02 DIAGNOSIS — G629 Polyneuropathy, unspecified: Secondary | ICD-10-CM | POA: Insufficient documentation

## 2018-04-02 DIAGNOSIS — Z79899 Other long term (current) drug therapy: Secondary | ICD-10-CM | POA: Diagnosis not present

## 2018-04-02 DIAGNOSIS — Z87891 Personal history of nicotine dependence: Secondary | ICD-10-CM | POA: Diagnosis not present

## 2018-04-02 DIAGNOSIS — C9001 Multiple myeloma in remission: Secondary | ICD-10-CM | POA: Diagnosis not present

## 2018-04-02 LAB — BASIC METABOLIC PANEL - CANCER CENTER ONLY
Anion gap: 11 (ref 5–15)
BUN: 14 mg/dL (ref 6–20)
CO2: 24 mmol/L (ref 22–32)
Calcium: 9.7 mg/dL (ref 8.9–10.3)
Chloride: 109 mmol/L (ref 98–111)
Creatinine: 1.09 mg/dL — ABNORMAL HIGH (ref 0.44–1.00)
GFR, Est AFR Am: >60 mL/min (ref >60)
GFR, Estimated: 54 mL/min — ABNORMAL LOW (ref >60)
Glucose, Bld: 99 mg/dL (ref 70–99)
Potassium: 4.4 mmol/L (ref 3.5–5.1)
Sodium: 144 mmol/L (ref 135–145)

## 2018-04-02 MED ORDER — LENALIDOMIDE 5 MG PO CAPS: 5.0000 mg | ORAL_CAPSULE | Freq: Every day | ORAL | 3 refills | Status: DC

## 2018-04-02 NOTE — Progress Notes (Signed)
HEMATOLOGY/ONCOLOGY CLINIC NOTE  Date of Service: 04/02/2018  Patient Care Team: Neale Burly, MD as PCP - General (Internal Medicine)  CHIEF COMPLAINTS/PURPOSE OF CONSULTATION:  F/u for continued mx of Multiple myeloma  Oncologic History:  62 y.o. female who initially presented with significant weight loss in the context of H. pylori-associated gastritis and alcohol abuse. At the same time, an incidental discovery of a lower thoracic vertebral mass in the context of chronic numbness and weakness in the lower extremities. MRI of the thoracic spine demonstrated a destructive lesion at T8 level and patient underwent surgical treatment. 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 Staging evaluation conducted by our clinic confirmed presence of IgG kappa multiple myeloma. Cytogenetics studies demonstrated normal karyotype, FISH studies are positive for 13q34 & D13S319 loss and no loss of p53. Based on initial information, patient was staged at R-ISS II.   Patient has completed 6 cycles of induction systemic chemotherapy with lenalidomide, bortezomib, and low-dose dexamethasone.  Repeat bone marrow biopsy demonstrated excellent response to treatment and patient subsequently underwent consolidation therapy including melphalan conditioning and autologous stem cell rescue.  Repeat assessment with PET/CT and bone marrow biopsy on 10/13/2017 demonstrates stringent complete response maintenance.  HISTORY OF PRESENTING ILLNESS:   Christine Garrett is a wonderful 62 y.o. female who has been referred to [place] by my colleague Dr Grace Isaac for evaluation and management of Multiple myeloma. The pt reports that she is doing well overall.   The pt has previously been followed by my colleague Dr Grace Isaac and completed 6 cycles of induction with Revlimid, Velcade and dexamethasone. She had a autologous PBSCT on 07/01/17. She is on lenalidomide maintenance currently.    The pt reports that she has had a headache for the past week, noting a constant pounding in the front of her forehead. She denies fevers and is always cold. She is having clear, nasal discharge and seasonal allergies. She notes aspirin has alleviated her headache some.   She is continuing to take Revlimid on 21 day cycles.  Most recent lab results (11/21/17) of CBC and CMP is as follows: all values are WNL.  MMP 11/21/17 is shows no M spike   On review of systems, pt reports headache, moving her bowels well, increasing strength, and denies mouth sores, fevers, night sweats, and any other symptoms.  Interval History:   Christine Garrett returns today for management and evaluation of her Multiple Myeloma currently in remission. The patient's last visit with [place] was on 01/21/18. The pt reports that she is doing well overall.   The pt reports that prior to her VAS [place] on 01/29/18, her right leg was hurting for 3 months. She has been on 85m Eliquis BID since, and her leg pain has resolved.   The pt also [facility] and per the pt, the work up indicated congestive heart failure that had worsened. She notes that her left ventricle was stiff and had fluid in her lungs and heart. She notes hardening of the arteries as well. These records have not yet been made available at the time of our visit.   The pt only continues on 9078mGabapentin and her neuropathy has been stable.  Lab results today (04/02/18) of BMP is as follows: all values are WNL except for Creatinine at 1.09.  On review of systems, pt reports good energy levels, stable neuropathy, and denies abdominal pain, leg swelling, leg pain, new bone pains,  and any other symptoms.   MEDICAL HISTORY:  Past Medical History:  Diagnosis Date  . Arthritis    knees  . Cancer ([place])   . Carpal tunnel syndrome   . CHF (congestive heart failure) ([place])   . Diabetes mellitus    type 2  . GERD (gastroesophageal reflux  disease)   . Heart murmur    "Little, No concerns" per Dr  Dannielle Burn pt reported.  . Hypertension    controlled using a guided approch with plasma renin activity  . Multiple myeloma not having achieved remission ([place]) 01/06/2017  . Neuropathy   . Peripheral vascular disease ([place])   . Sleep apnea    Uses CPAP    SURGICAL HISTORY: Past Surgical History:  Procedure Laterality Date  . BIOPSY  11/28/2016   Procedure: BIOPSY;  Surgeon: Rogene Houston, MD;  [facility];  Service: Endoscopy;;  gastric  . [place]  . CERVICAL CONE BIOPSY     cervical lesion  . ESOPHAGOGASTRODUODENOSCOPY (EGD) WITH PROPOFOL N/A 11/28/2016   Procedure: ESOPHAGOGASTRODUODENOSCOPY (EGD) WITH PROPOFOL;  Surgeon: Rogene Houston, MD;  [facility];  Service: Endoscopy;  Laterality: N/A;  9:25  . IR FLUORO GUIDE PORT INSERTION RIGHT  01/29/2017  . IR US GUIDE VASC ACCESS RIGHT  01/29/2017  . lipoma removal     right shoulder 2001  . MULTIPLE EXTRACTIONS WITH ALVEOLOPLASTY Bilateral 08/24/2012   Procedure: MULTIPLE EXTRACTION WITH ALVEOLOPLASTY BIOPSY OF RIGHT AND LEFT MANDIBLE ;  Surgeon: Gae Bon, DDS;  Location: [facility];  Service: Oral Surgery;  Laterality: Bilateral;  . POSTERIOR LUMBAR FUSION 4 LEVEL N/A 12/26/2016   Procedure: THORACIC EIGHT TUMOR RESECTION, THORACIC SIX- THORACIC TEN POSTERIOR SPINAL FUSION;  Surgeon: Ashok Pall, MD;  Location: [facility];  Service: Neurosurgery;  Laterality: N/A;  THORACIC 8 TUMOR RESECTION, THORACIC 6- THORACIC 10 POSTERIOR SPINAL FUSION  . ROTATOR CUFF REPAIR     right shoulder  . TUBAL LIGATION      SOCIAL HISTORY: Social History   Socioeconomic History  . Marital status: Single    Spouse name: Not on file  . Number of children: 3  . Years of education: Not on file  . Highest education level: Not on file  Occupational History  . Occupation: on disability  . Occupation: former Proofreader  . Financial resource strain: Not on file    . Food insecurity:    Worry: Not on file    Inability: Not on file  . Transportation needs:    Medical: Not on file    Non-medical: Not on file  Tobacco Use  . Smoking status: Former Smoker    Packs/day: 0.50    Years: 6.00    Pack years: 3.00    Types: Cigarettes    Last attempt to quit: 11/23/2011    Years since quitting: 6.3  . Smokeless tobacco: Never Used  . Tobacco comment: quit summer 2013  Substance and Sexual Activity  . Alcohol use: No    Alcohol/week: 6.0 standard drinks    Types: 6 Cans of beer per week  . Drug use: No  . Sexual activity: Not Currently    Birth control/protection: Post-menopausal  Lifestyle  . Physical activity:    Days per week: Not on file    Minutes per session: Not on file  . Stress: Not on file  Relationships  . Social connections:    Talks on phone: Not on  file    Gets together: Not on file    Attends religious service: Not on file    Active member of club or organization: Not on file    Attends meetings of clubs or organizations: Not on file    Relationship status: Not on file  . Intimate partner violence:    Fear of current or ex partner: Not on file    Emotionally abused: Not on file    Physically abused: Not on file    Forced sexual activity: Not on file  Other Topics Concern  . Not on file  Social History Narrative   Lives with son, daughter and grandkids in a 2 story home but stays on the first floor.  Has 3 children.  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    FAMILY HISTORY: Family History  Problem Relation Age of Onset  . Diabetes Mother   . Hypertension Mother   . Heart disease Mother   . Alzheimer's disease Mother   . Diabetes Father   . Heart disease Sister   . Diabetes Sister     ALLERGIES:  is allergic to hydrocodone and no known allergies.  MEDICATIONS:  Current Outpatient Medications  Medication Sig Dispense Refill  . acyclovir (ZOVIRAX) 400 MG tablet Take 1 tablet (400 mg  total) by mouth 2 (two) times daily. 60 tablet 0  . apixaban (ELIQUIS) 5 MG TABS tablet Take 1 tablet (5 mg total) by mouth 2 (two) times daily. 60 tablet 2  . chlorthalidone (HYGROTON) 25 MG tablet Take 25 mg by mouth 2 (two) times daily.   11  . diltiazem (CARDIZEM CD) 300 MG 24 hr capsule Take 371ms by mouth daily  3  . DULoxetine (CYMBALTA) 60 MG capsule Take 60 mg by mouth 2 (two) times daily.  0  . enalapril (VASOTEC) 20 MG tablet Take 20 mg by mouth 2 (two) times daily.     .Marland Kitchengabapentin (NEURONTIN) 300 MG capsule Take 3 capsules (900 mg total) by mouth 3 (three) times daily. 270 capsule 11  . insulin degludec (TRESIBA FLEXTOUCH) 100 UNIT/ML SOPN FlexTouch Pen Inject 0.5 mLs (50 Units total) into the skin daily at 10 pm. 5 pen 2  . Insulin Pen Needle (B-D ULTRAFINE III SHORT PEN) 31G X 8 MM MISC 1 each by Does not apply route as directed. 100 each 3  . lenalidomide (REVLIMID) 5 MG capsule Take 1 capsule (5 mg total) by mouth daily. 3 weeks on 1 week off.  Auth # [id number] capsule 3  . lidocaine-prilocaine (EMLA) cream Apply to affected area once 30 g 3  . metFORMIN (GLUCOPHAGE) 1000 MG tablet Take 1,000 mg by mouth 2 (two) times daily.     .Marland KitchentiZANidine (ZANAFLEX) 4 MG tablet Take 1 tablet (4 mg total) by mouth every 6 (six) hours as needed for muscle spasms. 60 tablet 0  . Vitamin D, Ergocalciferol, (DRISDOL) 50000 units CAPS capsule Take by mouth.     No current facility-administered medications for this visit.     REVIEW OF SYSTEMS:    A 10+ POINT REVIEW OF SYSTEMS WAS OBTAINED including neurology, dermatology, psychiatry, cardiac, respiratory, lymph, extremities, GI, GU, Musculoskeletal, constitutional, breasts, reproductive, HEENT.  All pertinent positives are noted in the HPI.  All others are negative.    PHYSICAL EXAMINATION: ECOG PERFORMANCE STATUS: 1 - Symptomatic but completely ambulatory   Vitals:   04/02/18 0835  BP: (!) 177/95  Pulse: 82  Resp: 20  Temp: 98.4 F  (36.9 C)  SpO2: 100%   Filed Weights   04/02/18 0835  Weight: 216 lb 9.6 oz (98.2 kg)   .Body mass index is 31.53 kg/m.  GENERAL:alert, in no acute distress and comfortable SKIN: no acute rashes, no significant lesions EYES: conjunctiva are pink and non-injected, sclera anicteric OROPHARYNX: MMM, no exudates, no oropharyngeal erythema or ulceration NECK: supple, no JVD LYMPH:  no palpable lymphadenopathy in the cervical, axillary or inguinal regions LUNGS: clear to auscultation b/l with normal respiratory effort HEART: regular rate & rhythm ABDOMEN:  normoactive bowel sounds , non tender, not distended. No palpable hepatosplenomegaly.  Extremity: 1+ pedal edema PSYCH: alert & oriented x 3 with fluent speech NEURO: no focal motor/sensory deficits   LABORATORY DATA:  I have reviewed the data as listed  . CBC Latest Ref Rng & Units 02/20/2018 01/21/2018 11/21/2017  WBC 3.9 - 10.3 K/uL 4.2 4.9 4.6  Hemoglobin 11.6 - 15.9 g/dL 11.6 11.3(L) 12.1  Hematocrit 34.8 - 46.6 % 35.3 33.4(L) 35.6  Platelets 145 - 400 K/uL 203 237 349    . CMP Latest Ref Rng & Units 04/02/2018 01/21/2018 11/21/2017  Glucose 70 - 99 mg/dL 99 117(H) 99  BUN 6 - 20 mg/dL _0 Creatinine 0.44 - 1.00 mg/dL 1.09(H) 0.99 0.96  Sodium 135 - 145 mmol/L 144 143 142  Potassium 3.5 - 5.1 mmol/L 4.4 3.9 3.5  Chloride 98 - 111 mmol/L 109 110 103  CO2 22 - 32 mmol/L _1 Calcium 8.9 - 10.3 mg/dL 9.7 9.0 9.9  Total Protein 6.5 - 8.1 g/dL - 7.0 7.2  Total Bilirubin 0.3 - 1.2 mg/dL - 0.4 0.4  Alkaline Phos 38 - 126 U/L - 54 68  AST 15 - 41 U/L - 13(L) 13  ALT 0 - 44 U/L - 10 7   03/25/18 SPEP and SFLC:     04/21/17 Cytogenetics:    01/17/17 Cytogenetics:    RADIOGRAPHIC STUDIES: I have personally reviewed the radiological images as listed and agreed with the findings in the report. No results found.  10/13/17 PET/CT     ASSESSMENT & PLAN:  62 y.o. female with  1. Multiple myeloma Currently in  remission Autologous PBSCT on 07/01/17. IgG kappa multiple myeloma. Cytogenetics studies demonstrated normal karyotype, FISH studies are positive for 13q34 & D13S319 loss and no loss of p53. Based on initial information, patient was staged at R-ISS II.  Completed 6 cycles of induction with Revlimid, Velcade and dexamethasone  2. Grade 2 neuropathy -controlled  3. Rt LE calf veins - possible clot ?chronic. Plan:  -Begin taking Vitamin B complex -Did refer the pt to a neurologist for repeat NCV with EMG on 02/26/18 as noted below  -Discussed pt's labs from [facility] on 03/25/18 with transplant team and Dr. Norma Fredrickson, pt's Kappa:Lambda ratio was WNL at 1.12, and M Protein was undetectable. HGB stable at 10.7. Normal PLT at 262k.  -Discussed the 01/29/18 VAS [place] BLE which revealed Right: Ultrasound is unable to distinguish whether obstruction in the Peroneal veins, and great saphenous vein, and superficial veins/varciosities is acute or chronic. No cystic structure found in the popliteal fossa. Left: no evidence of DVT.  -Discusseed the 02/26/18 NCV with EMG which revealed The electrophysiologic findings are most consistent with a chronic, length-dependent sensorimotor axonal and demyelinating polyneuropathy affecting the right side. Overall, these findings are moderate to severe in degree electrically. Incidentally, there is a right Martin-Gruber anastomosis,  a normal variant. -Will plan to restart pt on Revlimid for myeloma maintenance , but would continue 25m Eliquis BID in place of 880maspirin. The pt expresses agreement with this plan.  [facility] - no evidnece of progression of her myeloma. -Continue follow up with cardiology  -Continue Acyclovir  -Continue Zometa every 3 months  -Will see the pt back in 6-8 weeks    RTC with Dr KaIrene Limbon 05/22/2018 with labs and next zometa infusion   All of the patients questions were answered with apparent satisfaction. The patient  knows to call the clinic with any problems, questions or concerns.  The total time spent in the appt was 25 minutes and more than 50% was on counseling and direct patient cares.     GaSullivan LoneD MS AAHIVMS SCEncompass Health Rehabilitation [facility] Of [facility]viewTSt Catherine'S West Rehabilitation Hospitalematology/Oncology Physician CoTennova Healthcare Physicians [facility](Office):       [phone] cell):  [phone]):           [phone]/17/2019 9:52 AM  I, ScBaldwin Jamaicaam acting as a scribe for Dr. KaIrene Limbo.I have reviewed the above documentation for accuracy and completeness, and I agree with the above. .GBrunetta GeneraD

## 2018-04-02 NOTE — Telephone Encounter (Signed)
Gave pt avs and calendar  °

## 2018-04-03 ENCOUNTER — Other Ambulatory Visit: Payer: Self-pay

## 2018-04-03 MED ORDER — LENALIDOMIDE 5 MG PO CAPS: 5.0000 mg | ORAL_CAPSULE | Freq: Every day | ORAL | 3 refills | Status: DC

## 2018-04-08 DIAGNOSIS — I509 Heart failure, unspecified: Secondary | ICD-10-CM | POA: Diagnosis not present

## 2018-04-08 DIAGNOSIS — I5032 Chronic diastolic (congestive) heart failure: Secondary | ICD-10-CM | POA: Insufficient documentation

## 2018-04-17 DIAGNOSIS — I509 Heart failure, unspecified: Secondary | ICD-10-CM | POA: Diagnosis not present

## 2018-04-27 DIAGNOSIS — G4733 Obstructive sleep apnea (adult) (pediatric): Secondary | ICD-10-CM | POA: Diagnosis not present

## 2018-04-27 DIAGNOSIS — Z79899 Other long term (current) drug therapy: Secondary | ICD-10-CM | POA: Diagnosis not present

## 2018-04-27 DIAGNOSIS — I110 Hypertensive heart disease with heart failure: Secondary | ICD-10-CM | POA: Diagnosis not present

## 2018-04-27 DIAGNOSIS — Z794 Long term (current) use of insulin: Secondary | ICD-10-CM | POA: Diagnosis not present

## 2018-04-27 DIAGNOSIS — C9000 Multiple myeloma not having achieved remission: Secondary | ICD-10-CM | POA: Diagnosis not present

## 2018-04-27 DIAGNOSIS — E119 Type 2 diabetes mellitus without complications: Secondary | ICD-10-CM | POA: Diagnosis not present

## 2018-04-27 DIAGNOSIS — Z9221 Personal history of antineoplastic chemotherapy: Secondary | ICD-10-CM | POA: Diagnosis not present

## 2018-04-27 DIAGNOSIS — I509 Heart failure, unspecified: Secondary | ICD-10-CM | POA: Diagnosis not present

## 2018-04-27 DIAGNOSIS — Z86711 Personal history of pulmonary embolism: Secondary | ICD-10-CM | POA: Diagnosis not present

## 2018-04-27 DIAGNOSIS — I2510 Atherosclerotic heart disease of native coronary artery without angina pectoris: Secondary | ICD-10-CM | POA: Diagnosis not present

## 2018-05-05 DIAGNOSIS — I209 Angina pectoris, unspecified: Secondary | ICD-10-CM | POA: Diagnosis not present

## 2018-05-11 ENCOUNTER — Other Ambulatory Visit: Payer: Self-pay

## 2018-05-11 MED ORDER — LENALIDOMIDE 5 MG PO CAPS: 5.0000 mg | ORAL_CAPSULE | Freq: Every day | ORAL | 3 refills | Status: DC

## 2018-05-13 ENCOUNTER — Other Ambulatory Visit: Payer: Self-pay | Admitting: Hematology

## 2018-05-18 DIAGNOSIS — I5032 Chronic diastolic (congestive) heart failure: Secondary | ICD-10-CM | POA: Diagnosis not present

## 2018-05-21 NOTE — Progress Notes (Signed)
HEMATOLOGY/ONCOLOGY CLINIC NOTE  Date of Service: 05/22/2018  Patient Care Team: Neale Burly, MD as PCP - General (Internal Medicine)  CHIEF COMPLAINTS/PURPOSE OF CONSULTATION:  F/u for continued mx of Multiple myeloma  Oncologic History:  62 y.o. female who initially presented with significant weight loss in the context of H. pylori-associated gastritis and alcohol abuse. At the same time, an incidental discovery of a lower thoracic vertebral mass in the context of chronic numbness and weakness in the lower extremities. MRI of the thoracic spine demonstrated a destructive lesion at T8 level and patient underwent surgical treatment. 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 Staging evaluation conducted by our clinic confirmed presence of IgG kappa multiple myeloma. Cytogenetics studies demonstrated normal karyotype, FISH studies are positive for 13q34 & D13S319 loss and no loss of p53. Based on initial information, patient was staged at R-ISS II.   Patient has completed 6 cycles of induction systemic chemotherapy with lenalidomide, bortezomib, and low-dose dexamethasone.  Repeat bone marrow biopsy demonstrated excellent response to treatment and patient subsequently underwent consolidation therapy including melphalan conditioning and autologous stem cell rescue.  Repeat assessment with PET/CT and bone marrow biopsy on 10/13/2017 demonstrates stringent complete response maintenance.  HISTORY OF PRESENTING ILLNESS:   Christine Garrett is a wonderful 62 y.o. female who has been referred to [place] by my colleague Dr Grace Isaac for evaluation and management of Multiple myeloma. The pt reports that she is doing well overall.   The pt has previously been followed by my colleague Dr Grace Isaac and completed 6 cycles of induction with Revlimid, Velcade and dexamethasone. She had a autologous PBSCT on 07/01/17. She is on lenalidomide maintenance currently.   The  pt reports that she has had a headache for the past week, noting a constant pounding in the front of her forehead. She denies fevers and is always cold. She is having clear, nasal discharge and seasonal allergies. She notes aspirin has alleviated her headache some.   She is continuing to take Revlimid on 21 day cycles.  Most recent lab results (11/21/17) of CBC and CMP is as follows: all values are WNL.  MMP 11/21/17 is shows no M spike   On review of systems, pt reports headache, moving her bowels well, increasing strength, and denies mouth sores, fevers, night sweats, and any other symptoms.  Interval History:   Flornce Record returns today for management and evaluation of her Multiple Myeloma currently in remission. The patient's last visit with [place] was on 04/02/18. The pt reports that she is doing well overall.   The pt reports that she has had some recent colds, which she attributes to spending lots of time with her grandchildren over Thanksgiving. She notes that she has been eating well and her weight has been stable.   The pt is taking 945m Gabapentin TID, without symptomatic relief. She notes that her right foot is very numb, and her left foot experiences a tingling sensation primarily.   The pt denies any leg swelling, and was recently taken off Lasix about two days ago as her Creatinine had bumped up, per patient.   Lab results today (05/22/18) of CBC w/diff is as follows: all values are WNL except for RBC at 3.38, HGB at 10.6, HCT at 32.6. 05/22/18 CMP is pending 05/22/18 MMP - no M spike, nl SFLC ratio.  On review of systems, pt reports stable weight, neuropathy in feet, recent cold, eating well, and denies abdominal pains, leg swelling,  bone pains, and any other symptoms.   MEDICAL HISTORY:  Past Medical History:  Diagnosis Date  . Arthritis    knees  . Cancer ([place])   . Carpal tunnel syndrome   . CHF (congestive heart failure) ([place])   . Diabetes mellitus    type 2  . GERD  (gastroesophageal reflux disease)   . Heart murmur    "Little, No concerns" per Dr  Dannielle Burn pt reported.  . Hypertension    controlled using a guided approch with plasma renin activity  . Multiple myeloma not having achieved remission ([place]) 01/06/2017  . Neuropathy   . Peripheral vascular disease ([place])   . Sleep apnea    Uses CPAP    SURGICAL HISTORY: Past Surgical History:  Procedure Laterality Date  . BIOPSY  11/28/2016   Procedure: BIOPSY;  Surgeon: Rogene Houston, MD;  [facility];  Service: Endoscopy;;  gastric  . [place]  . CERVICAL CONE BIOPSY     cervical lesion  . ESOPHAGOGASTRODUODENOSCOPY (EGD) WITH PROPOFOL N/A 11/28/2016   Procedure: ESOPHAGOGASTRODUODENOSCOPY (EGD) WITH PROPOFOL;  Surgeon: Rogene Houston, MD;  [facility];  Service: Endoscopy;  Laterality: N/A;  9:25  . IR FLUORO GUIDE PORT INSERTION RIGHT  01/29/2017  . IR US GUIDE VASC ACCESS RIGHT  01/29/2017  . lipoma removal     right shoulder 2001  . MULTIPLE EXTRACTIONS WITH ALVEOLOPLASTY Bilateral 08/24/2012   Procedure: MULTIPLE EXTRACTION WITH ALVEOLOPLASTY BIOPSY OF RIGHT AND LEFT MANDIBLE ;  Surgeon: Gae Bon, DDS;  Location: [place];  Service: Oral Surgery;  Laterality: Bilateral;  . POSTERIOR LUMBAR FUSION 4 LEVEL N/A 12/26/2016   Procedure: THORACIC EIGHT TUMOR RESECTION, THORACIC SIX- THORACIC TEN POSTERIOR SPINAL FUSION;  Surgeon: Ashok Pall, MD;  Location: [place];  Service: Neurosurgery;  Laterality: N/A;  THORACIC 8 TUMOR RESECTION, THORACIC 6- THORACIC 10 POSTERIOR SPINAL FUSION  . ROTATOR CUFF REPAIR     right shoulder  . TUBAL LIGATION      SOCIAL HISTORY: Social History   Socioeconomic History  . Marital status: Single    Spouse name: Not on file  . Number of children: 3  . Years of education: Not on file  . Highest education level: Not on file  Occupational History  . Occupation: on disability  . Occupation: former Proofreader  . Financial  resource strain: Not on file  . Food insecurity:    Worry: Not on file    Inability: Not on file  . Transportation needs:    Medical: Not on file    Non-medical: Not on file  Tobacco Use  . Smoking status: Former Smoker    Packs/day: 0.50    Years: 6.00    Pack years: 3.00    Types: Cigarettes    Last attempt to quit: 11/23/2011    Years since quitting: 6.4  . Smokeless tobacco: Never Used  . Tobacco comment: quit summer 2013  Substance and Sexual Activity  . Alcohol use: No    Alcohol/week: 6.0 standard drinks    Types: 6 Cans of beer per week  . Drug use: No  . Sexual activity: Not Currently    Birth control/protection: Post-menopausal  Lifestyle  . Physical activity:    Days per week: Not on file    Minutes per session: Not on file  . Stress: Not on file  Relationships  . Social connections:    Talks on phone: Not  on file    Gets together: Not on file    Attends religious service: Not on file    Active member of club or organization: Not on file    Attends meetings of clubs or organizations: Not on file    Relationship status: Not on file  . Intimate partner violence:    Fear of current or ex partner: Not on file    Emotionally abused: Not on file    Physically abused: Not on file    Forced sexual activity: Not on file  Other Topics Concern  . Not on file  Social History Narrative   Lives with son, daughter and grandkids in a 2 story home but stays on the first floor.  Has 3 children.  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    FAMILY HISTORY: Family History  Problem Relation Age of Onset  . Diabetes Mother   . Hypertension Mother   . Heart disease Mother   . Alzheimer's disease Mother   . Diabetes Father   . Heart disease Sister   . Diabetes Sister     ALLERGIES:  is allergic to hydrocodone and no known allergies.  MEDICATIONS:  Current Outpatient Medications  Medication Sig Dispense Refill  . chlorthalidone (HYGROTON) 25 MG  tablet Take 25 mg by mouth 2 (two) times daily.   11  . diltiazem (CARDIZEM CD) 300 MG 24 hr capsule Take 340ms by mouth daily  3  . DULoxetine (CYMBALTA) 60 MG capsule Take 60 mg by mouth 2 (two) times daily.  0  . ELIQUIS 5 MG TABS tablet TAKE 1 TABLET BY MOUTH TWICE A DAY 60 tablet 2  . enalapril (VASOTEC) 20 MG tablet Take 20 mg by mouth 2 (two) times daily.     .Marland Kitchengabapentin (NEURONTIN) 300 MG capsule Take 3 capsules (900 mg total) by mouth 3 (three) times daily. 270 capsule 11  . insulin degludec (TRESIBA FLEXTOUCH) 100 UNIT/ML SOPN FlexTouch Pen Inject 0.5 mLs (50 Units total) into the skin daily at 10 pm. 5 pen 2  . Insulin Pen Needle (B-D ULTRAFINE III SHORT PEN) 31G X 8 MM MISC 1 each by Does not apply route as directed. 100 each 3  . lenalidomide (REVLIMID) 5 MG capsule Take 1 capsule (5 mg total) by mouth daily. 3 weeks on 1 week off.  Auth# [id number] 21 capsule 3  . metFORMIN (GLUCOPHAGE) 1000 MG tablet Take 1,000 mg by mouth 2 (two) times daily.     .Marland KitchentiZANidine (ZANAFLEX) 4 MG tablet Take 1 tablet (4 mg total) by mouth every 6 (six) hours as needed for muscle spasms. 60 tablet 0  . Vitamin D, Ergocalciferol, (DRISDOL) 50000 units CAPS capsule Take by mouth.     No current facility-administered medications for this visit.     REVIEW OF SYSTEMS:    A 10+ POINT REVIEW OF SYSTEMS WAS OBTAINED including neurology, dermatology, psychiatry, cardiac, respiratory, lymph, extremities, GI, GU, Musculoskeletal, constitutional, breasts, reproductive, HEENT.  All pertinent positives are noted in the HPI.  All others are negative.   PHYSICAL EXAMINATION: ECOG PERFORMANCE STATUS: 1 - Symptomatic but completely ambulatory   Vitals:   05/22/18 0927  BP: (!) 153/73  Pulse: 65  Resp: 18  Temp: 98.5 F (36.9 C)  SpO2: 100%   Filed Weights   05/22/18 0927  Weight: 215 lb 11.2 oz (97.8 kg)   .Body mass index is 31.4 kg/m.  GENERAL:alert, in no  acute distress and  comfortable SKIN: no acute rashes, no significant lesions EYES: conjunctiva are pink and non-injected, sclera anicteric OROPHARYNX: MMM, no exudates, no oropharyngeal erythema or ulceration NECK: supple, no JVD LYMPH:  no palpable lymphadenopathy in the cervical, axillary or inguinal regions LUNGS: clear to auscultation b/l with normal respiratory effort HEART: regular rate & rhythm ABDOMEN:  normoactive bowel sounds , non tender, not distended. No palpable hepatosplenomegaly.  Extremity: 1+ pedal edema PSYCH: alert & oriented x 3 with fluent speech NEURO: no focal motor/sensory deficits   LABORATORY DATA:  I have reviewed the data as listed  . CBC Latest Ref Rng & Units 05/22/2018 02/20/2018 01/21/2018  WBC 4.0 - 10.5 K/uL 5.2 4.2 4.9  Hemoglobin 12.0 - 15.0 g/dL 10.6(L) 11.6 11.3(L)  Hematocrit 36.0 - 46.0 % 32.6(L) 35.3 33.4(L)  Platelets 150 - 400 K/uL 181 203 237    . CMP Latest Ref Rng & Units 05/22/2018 04/02/2018 01/21/2018  Glucose 70 - 99 mg/dL 96 99 117(H)  BUN 6 - 20 mg/dL '13 14 7  ' Creatinine 0.44 - 1.00 mg/dL 1.13(H) 1.09(H) 0.99  Sodium 135 - 145 mmol/L 142 144 143  Potassium 3.5 - 5.1 mmol/L 4.6 4.4 3.9  Chloride 98 - 111 mmol/L 112(H) 109 110  CO2 22 - 32 mmol/L 20(L) 24 22  Calcium 8.9 - 10.3 mg/dL 9.1 9.7 9.0  Total Protein 6.5 - 8.1 g/dL 7.3 - 7.0  Total Bilirubin 0.3 - 1.2 mg/dL 0.5 - 0.4  Alkaline Phos 38 - 126 U/L 98 - 54  AST 15 - 41 U/L 49(H) - 13(L)  ALT 0 - 44 U/L 63(H) - 10   03/25/18 SPEP and SFLC:     04/21/17 Cytogenetics:    01/17/17 Cytogenetics:    RADIOGRAPHIC STUDIES: I have personally reviewed the radiological images as listed and agreed with the findings in the report. No results found.  10/13/17 PET/CT     ASSESSMENT & PLAN:  62 y.o. female with  1. Multiple myeloma Currently in remission Autologous PBSCT on 07/01/17. IgG kappa multiple myeloma. Cytogenetics studies demonstrated normal karyotype, FISH studies are positive for  13q34 & D13S319 loss and no loss of p53. Based on initial information, patient was staged at R-ISS II.  Completed 6 cycles of induction with Revlimid, Velcade and dexamethasone  2. Grade 2 neuropathy -controlled  02/26/18 NCV with EMG which revealed The electrophysiologic findings are most consistent with a chronic, length-dependent sensorimotor axonal and demyelinating polyneuropathy affecting the right side. Overall, these findings are moderate to severe in degree electrically. Incidentally, there is a right Martin-Gruber anastomosis, a normal variant.   3. Rt LE calf veins - possible clot ?chronic.  01/29/18 VAS [place] BLE which revealed Right: Ultrasound is unable to distinguish whether obstruction in the Peroneal veins, and great saphenous vein, and superficial veins/varciosities is acute or chronic. No cystic structure found in the popliteal fossa. Left: no evidence of DVT.   PLAN:  -Discussed pt labwork today, 05/22/18; Hgb slightly decreased to 10.6, other blood counts are stable. Will continue to watch anemia. Current anemia is in setting of viral infection.  -05/22/18 MMP is no M spike. Last available MMP from 02/20/18 did not observe an M spike, and SFLC were normal including K:L ratio at 1.49.  -The pt has no prohibitive toxicities from continuing 77m Revlimid at this time.   -Continue on 54mEliquis BID -Continue Zometa every 3 months   -Continue taking Vitamin B complex -Continue follow up with cardiology  -  Continue Acyclovir  -Will see the pt back in 8 weeks   -Continue Zometa every 3 months  -Will see the pt back in 8 weeks with labs    All of the patients questions were answered with apparent satisfaction. The patient knows to call the clinic with any problems, questions or concerns.  The total time spent in the appt was 25 minutes and more than 50% was on counseling and direct patient cares.     Sullivan Lone MD MS AAHIVMS [facility] Hematology/Oncology Physician [facility]  (Office):       [phone] (Work cell):  [phone] (Fax):           [phone]  05/22/2018 9:55 AM  I, Baldwin Jamaica, am acting as a scribe for Dr. Sullivan Lone.   .I have reviewed the above documentation for accuracy and completeness, and I agree with the above. Brunetta Genera MD

## 2018-05-22 ENCOUNTER — Other Ambulatory Visit: Payer: Medicare Other

## 2018-05-22 ENCOUNTER — Encounter: Payer: Self-pay | Admitting: Hematology

## 2018-05-22 ENCOUNTER — Inpatient Hospital Stay: Payer: Medicare Other | Attending: Hematology and Oncology

## 2018-05-22 ENCOUNTER — Ambulatory Visit: Payer: Medicare Other

## 2018-05-22 ENCOUNTER — Telehealth: Payer: Self-pay | Admitting: Hematology

## 2018-05-22 ENCOUNTER — Inpatient Hospital Stay: Payer: Medicare Other

## 2018-05-22 ENCOUNTER — Inpatient Hospital Stay (HOSPITAL_BASED_OUTPATIENT_CLINIC_OR_DEPARTMENT_OTHER): Payer: Medicare Other | Admitting: Hematology

## 2018-05-22 VITALS — BP 153/73 | HR 65 | Temp 98.5°F | Resp 18 | Ht 69.5 in | Wt 215.7 lb

## 2018-05-22 DIAGNOSIS — D649 Anemia, unspecified: Secondary | ICD-10-CM

## 2018-05-22 DIAGNOSIS — G629 Polyneuropathy, unspecified: Secondary | ICD-10-CM | POA: Insufficient documentation

## 2018-05-22 DIAGNOSIS — Z87891 Personal history of nicotine dependence: Secondary | ICD-10-CM | POA: Insufficient documentation

## 2018-05-22 DIAGNOSIS — C9001 Multiple myeloma in remission: Secondary | ICD-10-CM | POA: Insufficient documentation

## 2018-05-22 LAB — CBC WITH DIFFERENTIAL/PLATELET
ABS IMMATURE GRANULOCYTES: 0.02 10*3/uL (ref 0.00–0.07)
BASOS PCT: 1 %
Basophils Absolute: 0.1 10*3/uL (ref 0.0–0.1)
Eosinophils Absolute: 0.3 10*3/uL (ref 0.0–0.5)
Eosinophils Relative: 6 %
HCT: 32.6 % — ABNORMAL LOW (ref 36.0–46.0)
HEMOGLOBIN: 10.6 g/dL — AB (ref 12.0–15.0)
IMMATURE GRANULOCYTES: 0 %
LYMPHS PCT: 29 %
Lymphs Abs: 1.5 10*3/uL (ref 0.7–4.0)
MCH: 31.4 pg (ref 26.0–34.0)
MCHC: 32.5 g/dL (ref 30.0–36.0)
MCV: 96.4 fL (ref 80.0–100.0)
MONO ABS: 0.5 10*3/uL (ref 0.1–1.0)
MONOS PCT: 9 %
NEUTROS ABS: 2.8 10*3/uL (ref 1.7–7.7)
NEUTROS PCT: 55 %
PLATELETS: 181 10*3/uL (ref 150–400)
RBC: 3.38 MIL/uL — ABNORMAL LOW (ref 3.87–5.11)
RDW: 14.6 % (ref 11.5–15.5)
WBC: 5.2 10*3/uL (ref 4.0–10.5)
nRBC: 0 % (ref 0.0–0.2)

## 2018-05-22 LAB — CMP (CANCER CENTER ONLY)
ALBUMIN: 3.7 g/dL (ref 3.5–5.0)
ALT: 63 U/L — ABNORMAL HIGH (ref 0–44)
ANION GAP: 10 (ref 5–15)
AST: 49 U/L — AB (ref 15–41)
Alkaline Phosphatase: 98 U/L (ref 38–126)
BILIRUBIN TOTAL: 0.5 mg/dL (ref 0.3–1.2)
BUN: 13 mg/dL (ref 6–20)
CO2: 20 mmol/L — ABNORMAL LOW (ref 22–32)
Calcium: 9.1 mg/dL (ref 8.9–10.3)
Chloride: 112 mmol/L — ABNORMAL HIGH (ref 98–111)
Creatinine: 1.13 mg/dL — ABNORMAL HIGH (ref 0.44–1.00)
GFR, Est AFR Am: >60 mL/min (ref >60)
GFR, Estimated: 53 mL/min — ABNORMAL LOW (ref >60)
GLUCOSE: 96 mg/dL (ref 70–99)
POTASSIUM: 4.6 mmol/L (ref 3.5–5.1)
Sodium: 142 mmol/L (ref 135–145)
TOTAL PROTEIN: 7.3 g/dL (ref 6.5–8.1)

## 2018-05-22 MED ORDER — SODIUM CHLORIDE 0.9 % IV SOLN
Freq: Once | INTRAVENOUS | Status: AC
  Administered 2018-05-22: 10:00:00 via INTRAVENOUS
  Filled 2018-05-22: qty 250

## 2018-05-22 MED ORDER — SODIUM CHLORIDE 0.9% FLUSH
10.0000 mL | Freq: Once | INTRAVENOUS | Status: AC | PRN
  Administered 2018-05-22: 10 mL
  Filled 2018-05-22: qty 10

## 2018-05-22 MED ORDER — ZOLEDRONIC ACID 4 MG/100ML IV SOLN
4.0000 mg | Freq: Once | INTRAVENOUS | Status: AC
  Administered 2018-05-22: 4 mg via INTRAVENOUS
  Filled 2018-05-22: qty 100

## 2018-05-22 MED ORDER — HEPARIN SOD (PORK) LOCK FLUSH 100 UNIT/ML IV SOLN
500.0000 [IU] | Freq: Once | INTRAVENOUS | Status: AC | PRN
  Administered 2018-05-22: 500 [IU]
  Filled 2018-05-22: qty 5

## 2018-05-22 NOTE — Telephone Encounter (Signed)
Printed calendar and avs. °

## 2018-05-22 NOTE — Patient Instructions (Signed)
Thank you for choosing [facility] to provide your oncology and hematology care.  To afford each patient quality time with our providers, please arrive 30 minutes before your scheduled appointment time.  If you arrive late for your appointment, you may be asked to reschedule.  We strive to give you quality time with our providers, and arriving late affects you and other patients whose appointments are after yours.    If you are a no show for multiple scheduled visits, you may be dismissed from the clinic at the providers discretion.     Again, thank you for choosing [facility], our hope is that these requests will decrease the amount of time that you wait before being seen by our physicians.  ______________________________________________________________________   Should you have questions after your visit to the [facility], please contact our office at [phone] between the hours of 8:30 and 4:30 p.m.    Voicemails left after 4:30p.m will not be returned until the following business day.     For prescription refill requests, please have your pharmacy contact us directly.  Please also try to allow 48 hours for prescription requests.     Please contact the scheduling department for questions regarding scheduling.  For scheduling of procedures such as PET scans, CT scans, MRI, Ultrasound, etc please contact central scheduling at [phone].     Resources For Cancer Patients and Caregivers:    Oncolink.org:  A wonderful resource for patients and healthcare providers for information regarding your disease, ways to tract your treatment, what to expect, etc.      American Cancer Society:  800-227-2345  Can help patients locate various types of support and financial assistance   Cancer Care: 1-800-813-HOPE (4673) Provides financial assistance, online support groups, medication/co-pay assistance.     [place] DSS:  336-641-3447 Where to apply  for food stamps, Medicaid, and utility assistance   Medicare Rights Center: 800-333-4114 Helps people with Medicare understand their rights and benefits, navigate the Medicare system, and secure the quality healthcare they deserve   SCAT: 336-333-6589 [place] [employer]'s shared-ride transportation service for eligible riders who have a disability that prevents them from riding the fixed route bus.     For additional information on assistance programs please contact our social worker:   Christine Garrett:  [phone]  

## 2018-05-22 NOTE — Patient Instructions (Signed)

## 2018-05-25 LAB — KAPPA/LAMBDA LIGHT CHAINS
KAPPA FREE LGHT CHN: 23.1 mg/L — AB (ref 3.3–19.4)
Kappa, lambda light chain ratio: 0.81 (ref 0.26–1.65)
Lambda free light chains: 28.4 mg/L — ABNORMAL HIGH (ref 5.7–26.3)

## 2018-05-27 DIAGNOSIS — I209 Angina pectoris, unspecified: Secondary | ICD-10-CM | POA: Diagnosis not present

## 2018-05-27 DIAGNOSIS — I5032 Chronic diastolic (congestive) heart failure: Secondary | ICD-10-CM | POA: Diagnosis not present

## 2018-05-27 LAB — MULTIPLE MYELOMA PANEL, SERUM
ALBUMIN/GLOB SERPL: 1.4 (ref 0.7–1.7)
Albumin SerPl Elph-Mcnc: 3.8 g/dL (ref 2.9–4.4)
Alpha 1: 0.2 g/dL (ref 0.0–0.4)
Alpha2 Glob SerPl Elph-Mcnc: 0.8 g/dL (ref 0.4–1.0)
B-GLOBULIN SERPL ELPH-MCNC: 1 g/dL (ref 0.7–1.3)
GAMMA GLOB SERPL ELPH-MCNC: 0.9 g/dL (ref 0.4–1.8)
GLOBULIN, TOTAL: 2.9 g/dL (ref 2.2–3.9)
IGM (IMMUNOGLOBULIN M), SRM: 82 mg/dL (ref 26–217)
IgA: 124 mg/dL (ref 87–352)
IgG (Immunoglobin G), Serum: 1032 mg/dL (ref 700–1600)
Total Protein ELP: 6.7 g/dL (ref 6.0–8.5)

## 2018-06-02 ENCOUNTER — Encounter (INDEPENDENT_AMBULATORY_CARE_PROVIDER_SITE_OTHER): Payer: Self-pay | Admitting: Internal Medicine

## 2018-06-02 ENCOUNTER — Ambulatory Visit (INDEPENDENT_AMBULATORY_CARE_PROVIDER_SITE_OTHER): Payer: Medicare Other | Admitting: Internal Medicine

## 2018-06-02 VITALS — BP 118/70 | HR 64 | Temp 98.7°F | Ht 69.5 in | Wt 213.2 lb

## 2018-06-02 DIAGNOSIS — K219 Gastro-esophageal reflux disease without esophagitis: Secondary | ICD-10-CM

## 2018-06-02 NOTE — Progress Notes (Signed)
Subjective:    Patient ID: Christine Garrett, female    DOB: 1955-10-21, 62 y.o.   MRN: [id number]  HPI Here today for f/u. Last seen in December of 2018. Underwent an EGD in June of last year which revealed normal esophagus. Erosive gastropathy.  She tells me she is doing good. Her appetite is slowly coming back.  She has an appetite for junk foods. She gained from 194 to 213.2lb.  Her BMs are moving okay. No melena or BRRB.   She stays active.  She does not taking any NSAIDs except for Eliquis.  Denies any acid reflux. Hx of DVT and maintained on Eliquis.  Hx of Multiple Myeloma. Diagnosed 2 yrs ago. Followed at [facility].  Review of Systems Past Medical History:  Diagnosis Date  . Arthritis    knees  . Cancer ([place])   . Carpal tunnel syndrome   . CHF (congestive heart failure) ([place])   . Diabetes mellitus    type 2  . GERD (gastroesophageal reflux disease)   . Heart murmur    "Little, No concerns" per Dr  Dannielle Burn pt reported.  . Hypertension    controlled using a guided approch with plasma renin activity  . Multiple myeloma not having achieved remission ([place]) 01/06/2017  . Neuropathy   . Peripheral vascular disease ([place])   . Sleep apnea    Uses CPAP    Past Surgical History:  Procedure Laterality Date  . BIOPSY  11/28/2016   Procedure: BIOPSY;  Surgeon: Rogene Houston, MD;  [facility];  Service: Endoscopy;;  gastric  . [place]  . CERVICAL CONE BIOPSY     cervical lesion  . ESOPHAGOGASTRODUODENOSCOPY (EGD) WITH PROPOFOL N/A 11/28/2016   Procedure: ESOPHAGOGASTRODUODENOSCOPY (EGD) WITH PROPOFOL;  Surgeon: Rogene Houston, MD;  [facility];  Service: Endoscopy;  Laterality: N/A;  9:25  . IR FLUORO GUIDE PORT INSERTION RIGHT  01/29/2017  . IR US GUIDE VASC ACCESS RIGHT  01/29/2017  . lipoma removal     right shoulder 2001  . MULTIPLE EXTRACTIONS WITH ALVEOLOPLASTY Bilateral 08/24/2012   Procedure: MULTIPLE EXTRACTION WITH  ALVEOLOPLASTY BIOPSY OF RIGHT AND LEFT MANDIBLE ;  Surgeon: Gae Bon, DDS;  Location: [place];  Service: Oral Surgery;  Laterality: Bilateral;  . POSTERIOR LUMBAR FUSION 4 LEVEL N/A 12/26/2016   Procedure: THORACIC EIGHT TUMOR RESECTION, THORACIC SIX- THORACIC TEN POSTERIOR SPINAL FUSION;  Surgeon: Ashok Pall, MD;  Location: [place];  Service: Neurosurgery;  Laterality: N/A;  THORACIC 8 TUMOR RESECTION, THORACIC 6- THORACIC 10 POSTERIOR SPINAL FUSION  . ROTATOR CUFF REPAIR     right shoulder  . TUBAL LIGATION      Allergies  Allergen Reactions  . Hydrocodone Other (See Comments)    MENTAL STATUS CHANGE  MENTAL STATUS CHANGE   (Pt states she is not allergic to Hydrocodone, she was recently taking it) MENTAL STATUS CHANGE    . No Known Allergies     Current Outpatient Medications on File Prior to Visit  Medication Sig Dispense Refill  . atorvastatin (LIPITOR) 40 MG tablet Take 40 mg by mouth daily.    . carvedilol (COREG) 25 MG tablet Take 25 mg by mouth daily.    Marland Kitchen diltiazem (CARDIZEM CD) 300 MG 24 hr capsule Take 334ms by mouth daily  3  . DULoxetine (CYMBALTA) 60 MG capsule Take 60 mg by mouth 2 (two) times daily.  0  . EArne [place]  5 MG TABS tablet TAKE 1 TABLET BY MOUTH TWICE A DAY 60 tablet 2  . enalapril (VASOTEC) 20 MG tablet Take 20 mg by mouth 2 (two) times daily.     Marland Kitchen gabapentin (NEURONTIN) 300 MG capsule Take 3 capsules (900 mg total) by mouth 3 (three) times daily. 270 capsule 11  . insulin degludec (TRESIBA FLEXTOUCH) 100 UNIT/ML SOPN FlexTouch Pen Inject 0.5 mLs (50 Units total) into the skin daily at 10 pm. 5 pen 2  . Insulin Pen Needle (B-D ULTRAFINE III SHORT PEN) 31G X 8 MM MISC 1 each by Does not apply route as directed. 100 each 3  . lenalidomide (REVLIMID) 5 MG capsule Take 1 capsule (5 mg total) by mouth daily. 3 weeks on 1 week off.  Auth# [id number] 05/11/18 21 capsule 3  . metFORMIN (GLUCOPHAGE) 1000 MG tablet Take 1,000 mg by mouth 2 (two) times daily.     .  potassium chloride (K-DUR) 10 MEQ tablet Take 10 mEq by mouth daily.    Marland Kitchen spironolactone (ALDACTONE) 25 MG tablet Take 25 mg by mouth daily.    Marland Kitchen tiZANidine (ZANAFLEX) 4 MG tablet Take 1 tablet (4 mg total) by mouth every 6 (six) hours as needed for muscle spasms. 60 tablet 0  . Vitamin D, Ergocalciferol, (DRISDOL) 50000 units CAPS capsule Take by mouth.     No current facility-administered medications on file prior to visit.         Objective:   Physical Exam Blood pressure 118/70, pulse 64, temperature 98.7 F (37.1 C), height 5' 9.5" (1.765 m), weight 213 lb 3.2 oz (96.7 kg). Alert and oriented. Skin warm and dry. Oral mucosa is moist.   . Sclera anicteric, conjunctivae is pink. Thyroid not enlarged. No cervical lymphadenopathy. Lungs clear. Heart regular rate and rhythm.  Abdomen is soft. Bowel sounds are positive. No hepatomegaly. No abdominal masses felt. No tenderness.  No edema to lower extremities.          Assessment & Plan:  GERD. No NSAIDs. She is doing well. She has no GI complaints. She will Have OV in 1 year.

## 2018-06-02 NOTE — Patient Instructions (Signed)
OV in 1 year.  

## 2018-06-12 ENCOUNTER — Other Ambulatory Visit: Payer: Self-pay | Admitting: *Deleted

## 2018-06-12 MED ORDER — LENALIDOMIDE 5 MG PO CAPS: 5.0000 mg | ORAL_CAPSULE | Freq: Every day | ORAL | 0 refills | Status: DC

## 2018-06-15 ENCOUNTER — Ambulatory Visit (INDEPENDENT_AMBULATORY_CARE_PROVIDER_SITE_OTHER): Payer: Medicare Other | Admitting: Neurology

## 2018-06-15 ENCOUNTER — Encounter: Payer: Self-pay | Admitting: Neurology

## 2018-06-15 VITALS — BP 110/70 | HR 67 | Ht 69.0 in | Wt 210.2 lb

## 2018-06-15 DIAGNOSIS — G620 Drug-induced polyneuropathy: Secondary | ICD-10-CM

## 2018-06-15 DIAGNOSIS — M79671 Pain in right foot: Secondary | ICD-10-CM | POA: Diagnosis not present

## 2018-06-15 MED ORDER — GABAPENTIN 300 MG PO CAPS: 900.0000 mg | ORAL_CAPSULE | Freq: Three times a day (TID) | ORAL | 11 refills | Status: DC

## 2018-06-15 NOTE — Progress Notes (Signed)
Follow-up Visit   Date: 06/15/18    Christine Garrett MRN: [id number] DOB: 06-Mar-1956   Interim History: Christine Garrett is a 62 y.o. right-handed African American female with multiple myeloma in remission (2018), hypertension, GERD, diabetes mellitus, and alcohol use returning to the clinic for follow-up of painful peripheral neuropathy.  The patient was accompanied to the clinic by self.  History of present illness: Patient was found to have IgG kappa multiple myeloma following a workup for weight loss and GI discomfort when he abdomen showed left T8 lytic lesion with extension into the canal with cord compression. MRI of the thoracic spine on 12/17/2016 confirmed a large destructive mass lesion at the left pedicle of T9 extending posteriorly into the vertebral body, transverse process, and lamina. PET scan showed increased uptake in the right proximal tibia, he T9, right acetabulum, and femoral head. In August 2018, bone marrow biopsy confirmed the diagnosis of multiple myeloma. She was treated with chemotherapy from August 2018 - November 2018 with bortezomib, lenalidomide, and dexamethasone.  She underwent autologous stem cell transplantation in January 2019.  Lenalidomide was discontinued when right DVT was diagnosed.  Starting in late 2018, she began having achy pain involving the legs.  Over the past several months, pain has transitioned into more of a stabbing pain and numbness which involves her feet and worse on the right.  She also has some tingling involving her lower legs and rarely in the fingertips.  She falls frequently due to imbalance and uses a cane for long distances. Within her home, she uses the walls and furniture for support. Upon further questioning, she admits to having numbness and tingling of the feet even prior to the diagnosis of multiple myeloma, but states that symptoms have significantly worsened over the past year.  She takes gabapentin 633m three times daily  and cymbalta 655mdaily, which helps the tingling of her lower legs, unfortunately there is no improvement with stabbing pain in the feet.   She was drinking 6 back of beer daily for two year and cut back in June 2018 when diagnosed with multiple myeloma.  She currently drinks 2 beers nightly.  She has diabetes for many years which was poorly controlled.  Hemoglobin A1c from 2018 is 13, however she tells me it is much better controlled but is not aware of her current A1c.  UPDATE 06/15/2018: She is here for follow-up visit.  She complains today of a new ri tender.  She tries to avoid ht foot pain localized to the distal sole of the foot, which is worse with weightbearing and exquisite she continues to have numbness of the feet which is worse with weight bearing.  She tries to avoid walking on the ball of the foot because it causes worsening pain.  She takes gabapentin 90053mID.  She had not had any benefit with OTC ointments.  She is trying to cut back on alcohol, but continues to drink 2 beers daily.   She was hospitalized in October for heart failure and underwent cardiac cath which showed moderate to severe left circumflex disease which is being managed medically.   Medications:  Current Outpatient Medications on File Prior to Visit  Medication Sig Dispense Refill  . atorvastatin (LIPITOR) 40 MG tablet Take 40 mg by mouth daily.    . carvedilol (COREG) 25 MG tablet Take 25 mg by mouth daily.    . EMarland KitchenIQUIS 5 MG TABS tablet TAKE 1 TABLET BY MOUTH TWICE A DAY 60 tablet  2  . enalapril (VASOTEC) 20 MG tablet Take 20 mg by mouth 2 (two) times daily.     . insulin degludec (TRESIBA FLEXTOUCH) 100 UNIT/ML SOPN FlexTouch Pen Inject 0.5 mLs (50 Units total) into the skin daily at 10 pm. 5 pen 2  . Insulin Pen Needle (B-D ULTRAFINE III SHORT PEN) 31G X 8 MM MISC 1 each by Does not apply route as directed. 100 each 3  . lenalidomide (REVLIMID) 5 MG capsule Take 1 capsule (5 mg total) by mouth daily. 3  weeks on 1 week off.  [id number] 06/12/18 21 capsule 0  . metFORMIN (GLUCOPHAGE) 1000 MG tablet Take 1,000 mg by mouth 2 (two) times daily.     Marland Kitchen spironolactone (ALDACTONE) 25 MG tablet Take 25 mg by mouth daily.    Marland Kitchen tiZANidine (ZANAFLEX) 4 MG tablet Take 1 tablet (4 mg total) by mouth every 6 (six) hours as needed for muscle spasms. 60 tablet 0  . Vitamin D, Ergocalciferol, (DRISDOL) 50000 units CAPS capsule Take by mouth.     No current facility-administered medications on file prior to visit.     Allergies:  Allergies  Allergen Reactions  . Hydrocodone Other (See Comments)    MENTAL STATUS CHANGE  MENTAL STATUS CHANGE   (Pt states she is not allergic to Hydrocodone, she was recently taking it) MENTAL STATUS CHANGE    . No Known Allergies     Review of Systems:  CONSTITUTIONAL: No fevers, chills, night sweats, or weight loss.  EYES: No visual changes or eye pain ENT: No hearing changes.  No history of nose bleeds.   RESPIRATORY: No cough, wheezing and shortness of breath.   CARDIOVASCULAR: Negative for chest pain, and palpitations.   GI: Negative for abdominal discomfort, blood in stools or black stools.  No recent change in bowel habits.   GU:  No history of incontinence.   MUSCLOSKELETAL: +history of joint pain or swelling.  No myalgias.   SKIN: Negative for lesions, rash, and itching.   ENDOCRINE: Negative for cold or heat intolerance, polydipsia or goiter.   PSYCH:  No depression or anxiety symptoms.   NEURO: As Above.   Vital Signs:  BP 110/70   Pulse 67   Ht _0  (1.753 m)   Wt 210 lb 4 oz (95.4 kg)   SpO2 99%   BMI 31.05 kg/m   General Medical Exam:   General:  Well appearing, comfortable  Eyes/ENT: see cranial nerve examination.   Neck: No masses appreciated.  Full range of motion without tenderness.  No carotid bruits. Respiratory:  Clear to auscultation, good air entry bilaterally.   Cardiac:  Regular rate and rhythm, no murmur.   Ext:  Point  tenderness at the MTP joints 3-5 on the right foot.  No erythema or swelling.   Neurological Exam: MENTAL STATUS including orientation to time, place, person, recent and remote memory, attention span and concentration, language, and fund of knowledge is normal.  Speech is not dysarthric.  CRANIAL NERVES:  Face is symmetric.   MOTOR:  Motor strength is 5/5 in all extremities  No atrophy, fasciculations or abnormal movements.  No pronator drift.  Tone is normal.    MSRs:  Reflexes are 3+/4 throughout, except absent Achilles.  SENSORY:  Vibration is absent at the right ankle and great toe, reduced to 50% at the right knee and distal to the left ankle. Temperature and pinprick is also reduced in a gradient fashion below the knee on the right  and mid cough on the left. Romberg sign is positive.  COORDINATION/GAIT: Gait appears slow, assisted with cane, and there is guarding of the right foot with avoidance of weightbearing on the ball of the foot.    Data: MRI thoracic spine wwo contrast 12/17/2016:  Large destructive mass lesion centered in the left pedicle of T9 extends into the posterior aspect of the vertebral body, the left transverse process, facet and lamina and is consistent with metastatic disease or possibly myeloma. The lesion deflects the cord to the right and severely flattens it from the inferior aspect of T8 to the superior endplate of U27. Please note the discrepancy in the level of the lesion versus CT abdomen and pelvis is due to transitional lumbosacral anatomy.  Marked multilevel degenerative disc disease as described above.  NCS/EMG of the right arm and leg 02/26/2018:  The electrophysiologic findings are most consistent with a chronic, length-dependent sensorimotor axonal and demyelinating polyneuropathy affecting the right side. Overall, these findings are moderate to severe in degree electrically.  Incidentally, there is a right Martin-Gruber anastomosis, a normal  variant.  Labs v8/27/2019:  vitamin B12 233, folate 3.4, copper 88, HbA1c 4.9, TSH 0.592   IMPRESSION/PLAN: 1.  Length-dependent peripheral neuropathy involving the hands and lower legs manifesting with numbness and tingling.  Risk factors for neuropathy is history of alcohol use and chemotherapy with bortezomib.  Her pain related to her neuropathy is fairly well controlled on gabapentin 900 mg 3 times daily, which will be continued.  I do not recommend further titration of gabapentin due to her recent bump in creatinine.  I have advised her to try to cut back on alcohol consumption.  2.  Right foot pain is very localized to the ball of the foot and atypical for neuropathy.  Pain is reproduced by weightbearing and with exquisite tenderness to palpation over the distal foot.  I have asked her to follow-up with podiatry for further evaluation, as I do not think this is stemming from her underlying neuropathy.  Return to clinic in 6 months   Thank you for allowing me to participate in patient's care.  If I can answer any additional questions, I would be pleased to do so.    Sincerely,     K. Posey Pronto, DO

## 2018-06-15 NOTE — Patient Instructions (Addendum)
You can try over the counter lidocaine ointment  Continue gabapentin 900mg  three times daily  Please see a foot doctor for your right foot pain  Return to clinic in 6 months

## 2018-06-22 ENCOUNTER — Other Ambulatory Visit: Payer: Medicare Other

## 2018-06-22 ENCOUNTER — Ambulatory Visit: Payer: Medicare Other | Admitting: Hematology

## 2018-06-22 ENCOUNTER — Ambulatory Visit: Payer: Medicare Other

## 2018-06-25 ENCOUNTER — Other Ambulatory Visit: Payer: Self-pay | Admitting: *Deleted

## 2018-06-25 DIAGNOSIS — K2970 Gastritis, unspecified, without bleeding: Secondary | ICD-10-CM | POA: Diagnosis not present

## 2018-06-25 DIAGNOSIS — I5042 Chronic combined systolic (congestive) and diastolic (congestive) heart failure: Secondary | ICD-10-CM | POA: Diagnosis not present

## 2018-06-25 DIAGNOSIS — J449 Chronic obstructive pulmonary disease, unspecified: Secondary | ICD-10-CM | POA: Diagnosis not present

## 2018-06-25 DIAGNOSIS — E1149 Type 2 diabetes mellitus with other diabetic neurological complication: Secondary | ICD-10-CM | POA: Diagnosis not present

## 2018-06-25 DIAGNOSIS — C9001 Multiple myeloma in remission: Secondary | ICD-10-CM | POA: Diagnosis not present

## 2018-06-25 DIAGNOSIS — I10 Essential (primary) hypertension: Secondary | ICD-10-CM | POA: Diagnosis not present

## 2018-06-25 DIAGNOSIS — E7849 Other hyperlipidemia: Secondary | ICD-10-CM | POA: Diagnosis not present

## 2018-06-25 DIAGNOSIS — Z79899 Other long term (current) drug therapy: Secondary | ICD-10-CM | POA: Diagnosis not present

## 2018-06-25 MED ORDER — LENALIDOMIDE 5 MG PO CAPS: 5.0000 mg | ORAL_CAPSULE | Freq: Every day | ORAL | 0 refills | Status: DC

## 2018-06-25 NOTE — Telephone Encounter (Signed)
Notified by [name] at [facility] that Revlimid refill needed for patient. Advised her sent 06/12/18, to [place], [address], [place]. She asked to have it re-sent to [facility] on [address] in [place] as [place] address is closed. Prescription resent

## 2018-06-29 DIAGNOSIS — C9001 Multiple myeloma in remission: Secondary | ICD-10-CM | POA: Diagnosis not present

## 2018-06-29 DIAGNOSIS — G620 Drug-induced polyneuropathy: Secondary | ICD-10-CM | POA: Diagnosis not present

## 2018-06-29 DIAGNOSIS — Z9484 Stem cells transplant status: Secondary | ICD-10-CM | POA: Diagnosis not present

## 2018-07-10 ENCOUNTER — Other Ambulatory Visit: Payer: Self-pay | Admitting: *Deleted

## 2018-07-10 MED ORDER — LENALIDOMIDE 5 MG PO CAPS: 5.0000 mg | ORAL_CAPSULE | Freq: Every day | ORAL | 0 refills | Status: DC

## 2018-07-15 NOTE — Progress Notes (Signed)
HEMATOLOGY/ONCOLOGY CLINIC NOTE  Date of Service: 07/17/2018  Patient Care Team: Neale Burly, MD as PCP - General (Internal Medicine)  CHIEF COMPLAINTS/PURPOSE OF CONSULTATION:  F/u for continued mx of Multiple myeloma  Oncologic History:  63 y.o. female who initially presented with significant weight loss in the context of H. pylori-associated gastritis and alcohol abuse. At the same time, an incidental discovery of a lower thoracic vertebral mass in the context of chronic numbness and weakness in the lower extremities. MRI of the thoracic spine demonstrated a destructive lesion at T8 level and patient underwent surgical treatment. 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 Staging evaluation conducted by our clinic confirmed presence of IgG kappa multiple myeloma. Cytogenetics studies demonstrated normal karyotype, FISH studies are positive for 13q34 & D13S319 loss and no loss of p53. Based on initial information, patient was staged at R-ISS II.   Patient has completed 6 cycles of induction systemic chemotherapy with lenalidomide, bortezomib, and low-dose dexamethasone.  Repeat bone marrow biopsy demonstrated excellent response to treatment and patient subsequently underwent consolidation therapy including melphalan conditioning and autologous stem cell rescue.  Repeat assessment with PET/CT and bone marrow biopsy on 10/13/2017 demonstrates stringent complete response maintenance.  HISTORY OF PRESENTING ILLNESS:   Christine Garrett is a wonderful 63 y.o. female who has been referred to [place] by my colleague Dr Grace Isaac for evaluation and management of Multiple myeloma. The pt reports that she is doing well overall.   The pt has previously been followed by my colleague Dr Grace Isaac and completed 6 cycles of induction with Revlimid, Velcade and dexamethasone. She had a autologous PBSCT on 07/01/17. She is on lenalidomide maintenance currently.   The  pt reports that she has had a headache for the past week, noting a constant pounding in the front of her forehead. She denies fevers and is always cold. She is having clear, nasal discharge and seasonal allergies. She notes aspirin has alleviated her headache some.   She is continuing to take Revlimid on 21 day cycles.  Most recent lab results (11/21/17) of CBC and CMP is as follows: all values are WNL.  MMP 11/21/17 is shows no M spike   On review of systems, pt reports headache, moving her bowels well, increasing strength, and denies mouth sores, fevers, night sweats, and any other symptoms.  Interval History:   Christine Garrett returns today for management and evaluation of her Multiple Myeloma currently in remission. The patient's last visit with [place] was on 05/22/18. The pt reports that she is doing well overall.   The pt reports that she has not developed any new concerns in the interim, and denies any new bone pains. She notes her heart concerns are stable with her cardiologist, and notes that she was held on her cholesterol medication. She has continued on 59m Eliquis BID. She denies any new bone pains.  The pt notes that her painful neuropathy continues, but is more dull. She has followed up with her neurologist one month ago, and will be seeing podiatry soon.   The pt notes that her blood sugars have been well controlled as well.   She notes that her abdomen has some mild tenderness. She notes that she continues eating well and moving her bowels well.   Lab results today (07/17/18) of CBC w/diff and CMP is as follows: all values are WNL except for RBC at 3.54, HGB at 10.8, HCT at 33.3, RDW at 16.1, Glucose at 109,  Creatinine at 1.19, AST at 11, GFR at 57. 07/17/18 MMP is pending  On review of systems, pt reports dull painful neuropathy, stable energy levels, stable appetite, eating well, stable weight, moving her bowels well, and denies new bone pains, recent infections, leg swelling, and  any other symptoms.    MEDICAL HISTORY:  Past Medical History:  Diagnosis Date  . Arthritis    knees  . Cancer ([place])   . Carpal tunnel syndrome   . CHF (congestive heart failure) ([place])   . Diabetes mellitus    type 2  . GERD (gastroesophageal reflux disease)   . Heart murmur    "Little, No concerns" per Dr  Dannielle Burn pt reported.  . Hypertension    controlled using a guided approch with plasma renin activity  . Multiple myeloma not having achieved remission ([place]) 01/06/2017  . Neuropathy   . Peripheral vascular disease ([place])   . Sleep apnea    Uses CPAP    SURGICAL HISTORY: Past Surgical History:  Procedure Laterality Date  . BIOPSY  11/28/2016   Procedure: BIOPSY;  Surgeon: Rogene Houston, MD;  [facility];  Service: Endoscopy;;  gastric  . [place]  . CERVICAL CONE BIOPSY     cervical lesion  . ESOPHAGOGASTRODUODENOSCOPY (EGD) WITH PROPOFOL N/A 11/28/2016   Procedure: ESOPHAGOGASTRODUODENOSCOPY (EGD) WITH PROPOFOL;  Surgeon: Rogene Houston, MD;  [facility];  Service: Endoscopy;  Laterality: N/A;  9:25  . IR FLUORO GUIDE PORT INSERTION RIGHT  01/29/2017  . IR US GUIDE VASC ACCESS RIGHT  01/29/2017  . lipoma removal     right shoulder 2001  . MULTIPLE EXTRACTIONS WITH ALVEOLOPLASTY Bilateral 08/24/2012   Procedure: MULTIPLE EXTRACTION WITH ALVEOLOPLASTY BIOPSY OF RIGHT AND LEFT MANDIBLE ;  Surgeon: Gae Bon, DDS;  Location: [facility];  Service: Oral Surgery;  Laterality: Bilateral;  . POSTERIOR LUMBAR FUSION 4 LEVEL N/A 12/26/2016   Procedure: THORACIC EIGHT TUMOR RESECTION, THORACIC SIX- THORACIC TEN POSTERIOR SPINAL FUSION;  Surgeon: Ashok Pall, MD;  Location: [facility];  Service: Neurosurgery;  Laterality: N/A;  THORACIC 8 TUMOR RESECTION, THORACIC 6- THORACIC 10 POSTERIOR SPINAL FUSION  . ROTATOR CUFF REPAIR     right shoulder  . TUBAL LIGATION      SOCIAL HISTORY: Social History   Socioeconomic History  . Marital status: Single     Spouse name: Not on file  . Number of children: 3  . Years of education: Not on file  . Highest education level: Not on file  Occupational History  . Occupation: on disability  . Occupation: former Proofreader  . Financial resource strain: Not on file  . Food insecurity:    Worry: Not on file    Inability: Not on file  . Transportation needs:    Medical: Not on file    Non-medical: Not on file  Tobacco Use  . Smoking status: Former Smoker    Packs/day: 0.50    Years: 6.00    Pack years: 3.00    Types: Cigarettes    Last attempt to quit: 11/23/2011    Years since quitting: 6.6  . Smokeless tobacco: Never Used  . Tobacco comment: quit summer 2013  Substance and Sexual Activity  . Alcohol use: No    Alcohol/week: 6.0 standard drinks    Types: 6 Cans of beer per week  . Drug use: No  . Sexual activity: Not Currently    Birth control/protection:  Post-menopausal  Lifestyle  . Physical activity:    Days per week: Not on file    Minutes per session: Not on file  . Stress: Not on file  Relationships  . Social connections:    Talks on phone: Not on file    Gets together: Not on file    Attends religious service: Not on file    Active member of club or organization: Not on file    Attends meetings of clubs or organizations: Not on file    Relationship status: Not on file  . Intimate partner violence:    Fear of current or ex partner: Not on file    Emotionally abused: Not on file    Physically abused: Not on file    Forced sexual activity: Not on file  Other Topics Concern  . Not on file  Social History Narrative   Lives with son, daughter and grandkids in a 2 story home but stays on the first floor.  Has 3 children.  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    FAMILY HISTORY: Family History  Problem Relation Age of Onset  . Diabetes Mother   . Hypertension Mother   . Heart disease Mother   . Alzheimer's disease Mother   . Diabetes  Father   . Heart disease Sister   . Diabetes Sister     ALLERGIES:  is allergic to hydrocodone and no known allergies.  MEDICATIONS:  Current Outpatient Medications  Medication Sig Dispense Refill  . atorvastatin (LIPITOR) 40 MG tablet Take 40 mg by mouth daily.    . carvedilol (COREG) 25 MG tablet Take 25 mg by mouth daily.    Marland Kitchen ELIQUIS 5 MG TABS tablet TAKE 1 TABLET BY MOUTH TWICE A DAY 60 tablet 2  . enalapril (VASOTEC) 20 MG tablet Take 20 mg by mouth 2 (two) times daily.     Marland Kitchen gabapentin (NEURONTIN) 300 MG capsule Take 3 capsules (900 mg total) by mouth 3 (three) times daily. 270 capsule 11  . insulin degludec (TRESIBA FLEXTOUCH) 100 UNIT/ML SOPN FlexTouch Pen Inject 0.5 mLs (50 Units total) into the skin daily at 10 pm. 5 pen 2  . Insulin Pen Needle (B-D ULTRAFINE III SHORT PEN) 31G X 8 MM MISC 1 each by Does not apply route as directed. 100 each 3  . lenalidomide (REVLIMID) 5 MG capsule Take 1 capsule (5 mg total) by mouth daily. 3 weeks on 1 week off.  QTMA#[id number] 07/10/2018 21 capsule 0  . metFORMIN (GLUCOPHAGE) 1000 MG tablet Take 1,000 mg by mouth 2 (two) times daily.     Marland Kitchen spironolactone (ALDACTONE) 25 MG tablet Take 25 mg by mouth daily.    Marland Kitchen tiZANidine (ZANAFLEX) 4 MG tablet Take 1 tablet (4 mg total) by mouth every 6 (six) hours as needed for muscle spasms. 60 tablet 0  . Vitamin D, Ergocalciferol, (DRISDOL) 50000 units CAPS capsule Take by mouth.     No current facility-administered medications for this visit.     REVIEW OF SYSTEMS:    A 10+ POINT REVIEW OF SYSTEMS WAS OBTAINED including neurology, dermatology, psychiatry, cardiac, respiratory, lymph, extremities, GI, GU, Musculoskeletal, constitutional, breasts, reproductive, HEENT.  All pertinent positives are noted in the HPI.  All others are negative.   PHYSICAL EXAMINATION: ECOG PERFORMANCE STATUS: 1 - Symptomatic but completely ambulatory   Vitals:   07/17/18 1010  BP: (!) 157/74  Pulse: 64  Resp: 17  Temp: 98.9 F (37.2 C)  SpO2: 100%   Filed Weights   07/17/18 1010  Weight: 213 lb 8 oz (96.8 kg)   .Body mass index is 31.53 kg/m.  GENERAL:alert, in no acute distress and comfortable SKIN: no acute rashes, no significant lesions EYES: conjunctiva are pink and non-injected, sclera anicteric OROPHARYNX: MMM, no exudates, no oropharyngeal erythema or ulceration NECK: supple, no JVD LYMPH:  no palpable lymphadenopathy in the cervical, axillary or inguinal regions LUNGS: clear to auscultation b/l with normal respiratory effort HEART: S1 S2 irregular ABDOMEN:  normoactive bowel sounds , non tender, not distended. No palpable hepatosplenomegaly.  Extremity: 1+ pedal edema PSYCH: alert & oriented x 3 with fluent speech NEURO: no focal motor/sensory deficits   LABORATORY DATA:  I have reviewed the data as listed  . CBC Latest Ref Rng & Units 07/17/2018 05/22/2018 02/20/2018  WBC 4.0 - 10.5 K/uL 5.0 5.2 4.2  Hemoglobin 12.0 - 15.0 g/dL 10.8(L) 10.6(L) 11.6  Hematocrit 36.0 - 46.0 % 33.3(L) 32.6(L) 35.3  Platelets 150 - 400 K/uL 280 181 203    . CMP Latest Ref Rng & Units 07/17/2018 05/22/2018 04/02/2018  Glucose 70 - 99 mg/dL 109(H) 96 99  BUN 6 - 20 mg/dL '15 13 14  ' Creatinine 0.44 - 1.00 mg/dL 1.19(H) 1.13(H) 1.09(H)  Sodium 135 - 145 mmol/L 141 142 144  Potassium 3.5 - 5.1 mmol/L 4.3 4.6 4.4  Chloride 98 - 111 mmol/L 111 112(H) 109  CO2 22 - 32 mmol/L 23 20(L) 24  Calcium 8.9 - 10.3 mg/dL 8.9 9.1 9.7  Total Protein 6.5 - 8.1 g/dL 7.4 7.3 -  Total Bilirubin 0.3 - 1.2 mg/dL 0.5 0.5 -  Alkaline Phos 38 - 126 U/L 63 98 -  AST 15 - 41 U/L 11(L) 49(H) -  ALT 0 - 44 U/L 10 63(H) -   03/25/18 SPEP and SFLC:     04/21/17 Cytogenetics:    01/17/17 Cytogenetics:    RADIOGRAPHIC STUDIES: I have personally reviewed the radiological images as listed and agreed with the findings in the report. No results found.  10/13/17 PET/CT     ASSESSMENT & PLAN:  63 y.o. female  with  1. Multiple myeloma Currently in remission Autologous PBSCT on 07/01/17. IgG kappa multiple myeloma. Cytogenetics studies demonstrated normal karyotype, FISH studies are positive for 13q34 & D13S319 loss and no loss of p53. Based on initial information, patient was staged at R-ISS II.  Completed 6 cycles of induction with Revlimid, Velcade and dexamethasone  2. Grade 2 neuropathy -controlled  02/26/18 NCV with EMG which revealed The electrophysiologic findings are most consistent with a chronic, length-dependent sensorimotor axonal and demyelinating polyneuropathy affecting the right side. Overall, these findings are moderate to severe in degree electrically. Incidentally, there is a right Martin-Gruber anastomosis, a normal variant.   3. Rt LE calf veins - possible clot ?chronic.  01/29/18 VAS [place] BLE which revealed Right: Ultrasound is unable to distinguish whether obstruction in the Peroneal veins, and great saphenous vein, and superficial veins/varciosities is acute or chronic. No cystic structure found in the popliteal fossa. Left: no evidence of DVT.   PLAN:  -Discussed pt labwork today, 07/17/18; blood counts are stable, liver functions have normalized -07/17/18 MMP is pending. 05/22/18 MMP revealed that the pt continues in remission -The pt has no prohibitive toxicities from continuing 16m Revlimid, 3 weeks on and 1 week off, at this time. -Continue Zometa every 3 months   -Continue taking Vitamin B complex -Continue follow  up with cardiology  -Continue Acyclovir -Will see the pt back in 2 months   RTc with Dr Irene Limbo with labs in 2 months   All of the patients questions were answered with apparent satisfaction. The patient knows to call the clinic with any problems, questions or concerns.  The total time spent in the appt was 20 minutes and more than 50% was on counseling and direct patient cares.     Sullivan Lone MD MS AAHIVMS [facility] Hematology/Oncology Physician [facility]  (Office):       [phone] (Work cell):  [phone] (Fax):           [phone]  07/17/2018 10:39 AM  I, Baldwin [place], am acting as a scribe for Dr. Sullivan Lone.   .I have reviewed the above documentation for accuracy and completeness, and I agree with the above. Brunetta Genera MD

## 2018-07-16 ENCOUNTER — Other Ambulatory Visit: Payer: Self-pay | Admitting: *Deleted

## 2018-07-16 DIAGNOSIS — C9001 Multiple myeloma in remission: Secondary | ICD-10-CM

## 2018-07-17 ENCOUNTER — Telehealth: Payer: Self-pay

## 2018-07-17 ENCOUNTER — Inpatient Hospital Stay: Payer: Medicare Other

## 2018-07-17 ENCOUNTER — Inpatient Hospital Stay: Payer: Medicare Other | Attending: Hematology and Oncology

## 2018-07-17 ENCOUNTER — Inpatient Hospital Stay (HOSPITAL_BASED_OUTPATIENT_CLINIC_OR_DEPARTMENT_OTHER): Payer: Medicare Other | Admitting: Hematology

## 2018-07-17 ENCOUNTER — Other Ambulatory Visit: Payer: Self-pay | Admitting: *Deleted

## 2018-07-17 VITALS — BP 157/74 | HR 64 | Temp 98.9°F | Resp 17 | Ht 69.0 in | Wt 213.5 lb

## 2018-07-17 DIAGNOSIS — Z87891 Personal history of nicotine dependence: Secondary | ICD-10-CM | POA: Diagnosis not present

## 2018-07-17 DIAGNOSIS — C9001 Multiple myeloma in remission: Secondary | ICD-10-CM

## 2018-07-17 DIAGNOSIS — Z7901 Long term (current) use of anticoagulants: Secondary | ICD-10-CM

## 2018-07-17 DIAGNOSIS — G629 Polyneuropathy, unspecified: Secondary | ICD-10-CM | POA: Insufficient documentation

## 2018-07-17 LAB — CMP (CANCER CENTER ONLY)
ALBUMIN: 3.7 g/dL (ref 3.5–5.0)
ALT: 10 U/L (ref 0–44)
AST: 11 U/L — ABNORMAL LOW (ref 15–41)
Alkaline Phosphatase: 63 U/L (ref 38–126)
Anion gap: 7 (ref 5–15)
BUN: 15 mg/dL (ref 6–20)
CO2: 23 mmol/L (ref 22–32)
Calcium: 8.9 mg/dL (ref 8.9–10.3)
Chloride: 111 mmol/L (ref 98–111)
Creatinine: 1.19 mg/dL — ABNORMAL HIGH (ref 0.44–1.00)
GFR, Est AFR Am: 57 mL/min — ABNORMAL LOW (ref >60)
GFR, Estimated: 50 mL/min — ABNORMAL LOW (ref >60)
Glucose, Bld: 109 mg/dL — ABNORMAL HIGH (ref 70–99)
Potassium: 4.3 mmol/L (ref 3.5–5.1)
Sodium: 141 mmol/L (ref 135–145)
Total Bilirubin: 0.5 mg/dL (ref 0.3–1.2)
Total Protein: 7.4 g/dL (ref 6.5–8.1)

## 2018-07-17 LAB — CBC WITH DIFFERENTIAL/PLATELET
Abs Immature Granulocytes: 0.02 10*3/uL (ref 0.00–0.07)
Basophils Absolute: 0.1 10*3/uL (ref 0.0–0.1)
Basophils Relative: 1 %
Eosinophils Absolute: 0.4 10*3/uL (ref 0.0–0.5)
Eosinophils Relative: 8 %
HCT: 33.3 % — ABNORMAL LOW (ref 36.0–46.0)
Hemoglobin: 10.8 g/dL — ABNORMAL LOW (ref 12.0–15.0)
Immature Granulocytes: 0 %
Lymphocytes Relative: 35 %
Lymphs Abs: 1.7 10*3/uL (ref 0.7–4.0)
MCH: 30.5 pg (ref 26.0–34.0)
MCHC: 32.4 g/dL (ref 30.0–36.0)
MCV: 94.1 fL (ref 80.0–100.0)
Monocytes Absolute: 0.5 10*3/uL (ref 0.1–1.0)
Monocytes Relative: 9 %
NRBC: 0 % (ref 0.0–0.2)
Neutro Abs: 2.4 10*3/uL (ref 1.7–7.7)
Neutrophils Relative %: 47 %
Platelets: 280 10*3/uL (ref 150–400)
RBC: 3.54 MIL/uL — ABNORMAL LOW (ref 3.87–5.11)
RDW: 16.1 % — ABNORMAL HIGH (ref 11.5–15.5)
WBC: 5 10*3/uL (ref 4.0–10.5)

## 2018-07-17 MED ORDER — HEPARIN SOD (PORK) LOCK FLUSH 100 UNIT/ML IV SOLN
500.0000 [IU] | Freq: Once | INTRAVENOUS | Status: AC | PRN
  Administered 2018-07-17: 500 [IU]
  Filled 2018-07-17: qty 5

## 2018-07-17 MED ORDER — SODIUM CHLORIDE 0.9% FLUSH
10.0000 mL | Freq: Once | INTRAVENOUS | Status: AC | PRN
  Administered 2018-07-17: 10 mL
  Filled 2018-07-17: qty 10

## 2018-07-17 NOTE — Telephone Encounter (Signed)
Printed avs and calender of upcoming appointment. Per 1/31 los 

## 2018-07-20 ENCOUNTER — Ambulatory Visit: Payer: Medicare Other

## 2018-07-20 ENCOUNTER — Other Ambulatory Visit: Payer: Medicare Other

## 2018-07-20 ENCOUNTER — Ambulatory Visit: Payer: Medicare Other | Admitting: Hematology

## 2018-07-21 LAB — MULTIPLE MYELOMA PANEL, SERUM
ALPHA 1: 0.2 g/dL (ref 0.0–0.4)
Albumin SerPl Elph-Mcnc: 3.6 g/dL (ref 2.9–4.4)
Albumin/Glob SerPl: 1.2 (ref 0.7–1.7)
Alpha2 Glob SerPl Elph-Mcnc: 0.8 g/dL (ref 0.4–1.0)
B-Globulin SerPl Elph-Mcnc: 1.1 g/dL (ref 0.7–1.3)
Gamma Glob SerPl Elph-Mcnc: 1.1 g/dL (ref 0.4–1.8)
Globulin, Total: 3.2 g/dL (ref 2.2–3.9)
IGA: 174 mg/dL (ref 87–352)
IgG (Immunoglobin G), Serum: 1262 mg/dL (ref 700–1600)
IgM (Immunoglobulin M), Srm: 67 mg/dL (ref 26–217)
Total Protein ELP: 6.8 g/dL (ref 6.0–8.5)

## 2018-07-23 DIAGNOSIS — M79671 Pain in right foot: Secondary | ICD-10-CM | POA: Diagnosis not present

## 2018-07-23 DIAGNOSIS — S93331A Other subluxation of right foot, initial encounter: Secondary | ICD-10-CM | POA: Diagnosis not present

## 2018-07-23 DIAGNOSIS — M79672 Pain in left foot: Secondary | ICD-10-CM | POA: Diagnosis not present

## 2018-07-23 DIAGNOSIS — I739 Peripheral vascular disease, unspecified: Secondary | ICD-10-CM | POA: Diagnosis not present

## 2018-07-23 DIAGNOSIS — E1140 Type 2 diabetes mellitus with diabetic neuropathy, unspecified: Secondary | ICD-10-CM | POA: Diagnosis not present

## 2018-07-23 DIAGNOSIS — E1151 Type 2 diabetes mellitus with diabetic peripheral angiopathy without gangrene: Secondary | ICD-10-CM | POA: Diagnosis not present

## 2018-07-28 ENCOUNTER — Telehealth: Payer: Self-pay | Admitting: *Deleted

## 2018-07-28 NOTE — Telephone Encounter (Signed)
Attempted to contact patient at Dr. Grier Mitts request to let patient know she continues to be in remission from her myeloma. She is to continue maintenance Revlimid and followup as per scheduled appointment.  Left voice mail to contact office, # provided, to obtain test results.

## 2018-08-03 ENCOUNTER — Other Ambulatory Visit: Payer: Self-pay | Admitting: *Deleted

## 2018-08-03 MED ORDER — LENALIDOMIDE 5 MG PO CAPS: 5.0000 mg | ORAL_CAPSULE | Freq: Every day | ORAL | 0 refills | Status: DC

## 2018-08-03 NOTE — Telephone Encounter (Signed)
Revlimid refilled per [facility] note 07/17/2018. Auth # [id number]

## 2018-08-11 DIAGNOSIS — S93331D Other subluxation of right foot, subsequent encounter: Secondary | ICD-10-CM | POA: Diagnosis not present

## 2018-08-11 DIAGNOSIS — E1140 Type 2 diabetes mellitus with diabetic neuropathy, unspecified: Secondary | ICD-10-CM | POA: Diagnosis not present

## 2018-08-11 DIAGNOSIS — I739 Peripheral vascular disease, unspecified: Secondary | ICD-10-CM | POA: Diagnosis not present

## 2018-08-11 DIAGNOSIS — M79671 Pain in right foot: Secondary | ICD-10-CM | POA: Diagnosis not present

## 2018-08-11 DIAGNOSIS — M779 Enthesopathy, unspecified: Secondary | ICD-10-CM | POA: Diagnosis not present

## 2018-08-21 ENCOUNTER — Telehealth: Payer: Self-pay | Admitting: *Deleted

## 2018-08-21 ENCOUNTER — Inpatient Hospital Stay: Payer: Medicare Other

## 2018-08-21 ENCOUNTER — Inpatient Hospital Stay: Payer: Medicare Other | Attending: Hematology and Oncology

## 2018-08-21 VITALS — BP 189/81 | HR 70 | Temp 99.2°F | Resp 20

## 2018-08-21 DIAGNOSIS — C9001 Multiple myeloma in remission: Secondary | ICD-10-CM | POA: Insufficient documentation

## 2018-08-21 DIAGNOSIS — Z95828 Presence of other vascular implants and grafts: Secondary | ICD-10-CM

## 2018-08-21 LAB — CBC WITH DIFFERENTIAL/PLATELET
Abs Immature Granulocytes: 0.02 10*3/uL (ref 0.00–0.07)
BASOS PCT: 2 %
Basophils Absolute: 0.1 10*3/uL (ref 0.0–0.1)
Eosinophils Absolute: 0.4 10*3/uL (ref 0.0–0.5)
Eosinophils Relative: 8 %
HCT: 33.1 % — ABNORMAL LOW (ref 36.0–46.0)
Hemoglobin: 10.8 g/dL — ABNORMAL LOW (ref 12.0–15.0)
Immature Granulocytes: 0 %
Lymphocytes Relative: 43 %
Lymphs Abs: 2 10*3/uL (ref 0.7–4.0)
MCH: 31.4 pg (ref 26.0–34.0)
MCHC: 32.6 g/dL (ref 30.0–36.0)
MCV: 96.2 fL (ref 80.0–100.0)
Monocytes Absolute: 0.5 10*3/uL (ref 0.1–1.0)
Monocytes Relative: 11 %
Neutro Abs: 1.7 10*3/uL (ref 1.7–7.7)
Neutrophils Relative %: 36 %
PLATELETS: 282 10*3/uL (ref 150–400)
RBC: 3.44 MIL/uL — AB (ref 3.87–5.11)
RDW: 16.9 % — ABNORMAL HIGH (ref 11.5–15.5)
WBC: 4.8 10*3/uL (ref 4.0–10.5)
nRBC: 0 % (ref 0.0–0.2)

## 2018-08-21 LAB — COMPREHENSIVE METABOLIC PANEL
ALBUMIN: 3.9 g/dL (ref 3.5–5.0)
ALT: 15 U/L (ref 0–44)
AST: 17 U/L (ref 15–41)
Alkaline Phosphatase: 60 U/L (ref 38–126)
Anion gap: 5 (ref 5–15)
BUN: 17 mg/dL (ref 6–20)
CHLORIDE: 112 mmol/L — AB (ref 98–111)
CO2: 23 mmol/L (ref 22–32)
Calcium: 9.1 mg/dL (ref 8.9–10.3)
Creatinine, Ser: 1.14 mg/dL — ABNORMAL HIGH (ref 0.44–1.00)
GFR calc Af Amer: >60 mL/min (ref >60)
GFR calc non Af Amer: 52 mL/min — ABNORMAL LOW (ref >60)
Glucose, Bld: 110 mg/dL — ABNORMAL HIGH (ref 70–99)
Potassium: 4.2 mmol/L (ref 3.5–5.1)
SODIUM: 140 mmol/L (ref 135–145)
Total Bilirubin: 0.5 mg/dL (ref 0.3–1.2)
Total Protein: 7.5 g/dL (ref 6.5–8.1)

## 2018-08-21 MED ORDER — SODIUM CHLORIDE 0.9% FLUSH
10.0000 mL | Freq: Once | INTRAVENOUS | Status: AC
  Administered 2018-08-21: 10 mL
  Filled 2018-08-21: qty 10

## 2018-08-21 MED ORDER — ZOLEDRONIC ACID 4 MG/100ML IV SOLN
4.0000 mg | Freq: Once | INTRAVENOUS | Status: AC
  Administered 2018-08-21: 4 mg via INTRAVENOUS
  Filled 2018-08-21: qty 100

## 2018-08-21 MED ORDER — SODIUM CHLORIDE 0.9 % IV SOLN
Freq: Once | INTRAVENOUS | Status: AC
  Administered 2018-08-21: 10:00:00 via INTRAVENOUS
  Filled 2018-08-21: qty 250

## 2018-08-21 MED ORDER — HEPARIN SOD (PORK) LOCK FLUSH 100 UNIT/ML IV SOLN
500.0000 [IU] | Freq: Once | INTRAVENOUS | Status: AC | PRN
  Administered 2018-08-21: 500 [IU]
  Filled 2018-08-21: qty 5

## 2018-08-21 NOTE — Patient Instructions (Signed)

## 2018-08-21 NOTE — Telephone Encounter (Signed)
Per Delfin Gant, RN, while patient was in infusion, patient reported back pain starting 2 days ago, 9 out of 10 on pain scale.  Information given to Dr. Irene Limbo. Per Dr. Irene Limbo, advise patient to see PCP or go to Urgent Care for evaluation. Contacted patient. Patient confirms same information as given to infusion RN. Ms. Worth verbalized understanding of Dr. Grier Mitts directions to have evaluation of back pain.

## 2018-08-24 LAB — MULTIPLE MYELOMA PANEL, SERUM
ALBUMIN SERPL ELPH-MCNC: 3.6 g/dL (ref 2.9–4.4)
Albumin/Glob SerPl: 1.2 (ref 0.7–1.7)
Alpha 1: 0.2 g/dL (ref 0.0–0.4)
Alpha2 Glob SerPl Elph-Mcnc: 0.8 g/dL (ref 0.4–1.0)
B-Globulin SerPl Elph-Mcnc: 1.1 g/dL (ref 0.7–1.3)
GLOBULIN, TOTAL: 3.2 g/dL (ref 2.2–3.9)
Gamma Glob SerPl Elph-Mcnc: 1.2 g/dL (ref 0.4–1.8)
IgA: 184 mg/dL (ref 87–352)
IgG (Immunoglobin G), Serum: 1165 mg/dL (ref 700–1600)
IgM (Immunoglobulin M), Srm: 49 mg/dL (ref 26–217)
Total Protein ELP: 6.8 g/dL (ref 6.0–8.5)

## 2018-08-25 NOTE — Telephone Encounter (Signed)
Patient called back - received information as noted. Verbalized understanding.

## 2018-08-26 DIAGNOSIS — I5032 Chronic diastolic (congestive) heart failure: Secondary | ICD-10-CM | POA: Diagnosis not present

## 2018-08-26 DIAGNOSIS — I25118 Atherosclerotic heart disease of native coronary artery with other forms of angina pectoris: Secondary | ICD-10-CM | POA: Diagnosis not present

## 2018-09-04 ENCOUNTER — Other Ambulatory Visit: Payer: Self-pay | Admitting: *Deleted

## 2018-09-04 MED ORDER — LENALIDOMIDE 5 MG PO CAPS: 5.0000 mg | ORAL_CAPSULE | Freq: Every day | ORAL | 0 refills | Status: DC

## 2018-09-04 NOTE — Telephone Encounter (Signed)
Revlimid refilled per Dr. Irene Limbo OV note 07/17/2018. Sent to [facility] [place]. [place] [phone], 09/04/2018

## 2018-09-10 ENCOUNTER — Telehealth: Payer: Self-pay | Admitting: *Deleted

## 2018-09-10 NOTE — Telephone Encounter (Signed)
Per Dr. Irene Limbo: cancel lab appt for 3/27 and reschedule 3/27 11:40am MD appt to phone visit. Patient verbalized understanding.

## 2018-09-10 NOTE — Progress Notes (Signed)
HEMATOLOGY/ONCOLOGY CLINIC NOTE  Date of Service: 09/11/2018  Patient Care Team: Neale Burly, MD as PCP - General (Internal Medicine)  CHIEF COMPLAINTS/PURPOSE OF CONSULTATION:  F/u for continued mx of Multiple myeloma  Oncologic History:  63 y.o. female who initially presented with significant weight loss in the context of H. pylori-associated gastritis and alcohol abuse. At the same time, an incidental discovery of a lower thoracic vertebral mass in the context of chronic numbness and weakness in the lower extremities. MRI of the thoracic spine demonstrated a destructive lesion at T8 level and patient underwent surgical treatment. 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 Staging evaluation conducted by our clinic confirmed presence of IgG kappa multiple myeloma. Cytogenetics studies demonstrated normal karyotype, FISH studies are positive for 13q34 & D13S319 loss and no loss of p53. Based on initial information, patient was staged at R-ISS II.   Patient has completed 6 cycles of induction systemic chemotherapy with lenalidomide, bortezomib, and low-dose dexamethasone.  Repeat bone marrow biopsy demonstrated excellent response to treatment and patient subsequently underwent consolidation therapy including melphalan conditioning and autologous stem cell rescue.  Repeat assessment with PET/CT and bone marrow biopsy on 10/13/2017 demonstrates stringent complete response maintenance.  HISTORY OF PRESENTING ILLNESS:   Christine Garrett is a wonderful 63 y.o. female who has been referred to [place] by my colleague Dr Grace Isaac for evaluation and management of Multiple myeloma. The pt reports that she is doing well overall.   The pt has previously been followed by my colleague Dr Grace Isaac and completed 6 cycles of induction with Revlimid, Velcade and dexamethasone. She had a autologous PBSCT on 07/01/17. She is on lenalidomide maintenance currently.   The  pt reports that she has had a headache for the past week, noting a constant pounding in the front of her forehead. She denies fevers and is always cold. She is having clear, nasal discharge and seasonal allergies. She notes aspirin has alleviated her headache some.   She is continuing to take Revlimid on 21 day cycles.  Most recent lab results (11/21/17) of CBC and CMP is as follows: all values are WNL.  MMP 11/21/17 is shows no M spike   On review of systems, pt reports headache, moving her bowels well, increasing strength, and denies mouth sores, fevers, night sweats, and any other symptoms.  Interval History:   Christine Garrett is visited today via a Phone Visit, utilized for Covid-19 infection prevention strategies, for management and evaluation of her Multiple Myeloma currently in remission. The patient's last visit with [place] was on 07/17/18. The pt reports that she is doing well overall.   The pt reports that she has not developed any new concerns in the interim. She denies any problems taking her maintenance Revlimid. She endorses stable energy levels and is eating well. The pt denies any concerns for infections at this time.  Lab results (08/21/18) of CBC w/diff and CMP is as follows: all values are WNL except for RBC at 3.44, HGB at 10.8, HCT at 33.1, RDW at 16.9, Chloride at 112, Glucose at 110, Creatinine at 1.14. 08/21/18 MMP revealed all values WNL, including the absence of an M spike.  On review of systems, pt reports stable energy levels, eating well,  and denies concerns for infections, and any other symptoms.   MEDICAL HISTORY:  Past Medical History:  Diagnosis Date  . Arthritis    knees  . Cancer ([place])   . Carpal tunnel syndrome   .  CHF (congestive heart failure) ([place])   . Diabetes mellitus    type 2  . GERD (gastroesophageal reflux disease)   . Heart murmur    "Little, No concerns" per Dr  Dannielle Burn pt reported.  . Hypertension    controlled using a guided approch with plasma  renin activity  . Multiple myeloma not having achieved remission ([place]) 01/06/2017  . Neuropathy   . Peripheral vascular disease ([place])   . Sleep apnea    Uses CPAP    SURGICAL HISTORY: Past Surgical History:  Procedure Laterality Date  . BIOPSY  11/28/2016   Procedure: BIOPSY;  Surgeon: Rogene Houston, MD;  [facility];  Service: Endoscopy;;  gastric  . [place]  . CERVICAL CONE BIOPSY     cervical lesion  . ESOPHAGOGASTRODUODENOSCOPY (EGD) WITH PROPOFOL N/A 11/28/2016   Procedure: ESOPHAGOGASTRODUODENOSCOPY (EGD) WITH PROPOFOL;  Surgeon: Rogene Houston, MD;  [facility];  Service: Endoscopy;  Laterality: N/A;  9:25  . IR FLUORO GUIDE PORT INSERTION RIGHT  01/29/2017  . IR US GUIDE VASC ACCESS RIGHT  01/29/2017  . lipoma removal     right shoulder 2001  . MULTIPLE EXTRACTIONS WITH ALVEOLOPLASTY Bilateral 08/24/2012   Procedure: MULTIPLE EXTRACTION WITH ALVEOLOPLASTY BIOPSY OF RIGHT AND LEFT MANDIBLE ;  Surgeon: Gae Bon, DDS;  Location: [facility];  Service: Oral Surgery;  Laterality: Bilateral;  . POSTERIOR LUMBAR FUSION 4 LEVEL N/A 12/26/2016   Procedure: THORACIC EIGHT TUMOR RESECTION, THORACIC SIX- THORACIC TEN POSTERIOR SPINAL FUSION;  Surgeon: Ashok Pall, MD;  Location: [facility];  Service: Neurosurgery;  Laterality: N/A;  THORACIC 8 TUMOR RESECTION, THORACIC 6- THORACIC 10 POSTERIOR SPINAL FUSION  . ROTATOR CUFF REPAIR     right shoulder  . TUBAL LIGATION      SOCIAL HISTORY: Social History   Socioeconomic History  . Marital status: Single    Spouse name: Not on file  . Number of children: 3  . Years of education: Not on file  . Highest education level: Not on file  Occupational History  . Occupation: on disability  . Occupation: former Proofreader  . Financial resource strain: Not on file  . Food insecurity:    Worry: Not on file    Inability: Not on file  . Transportation needs:    Medical: Not on file    Non-medical: Not on  file  Tobacco Use  . Smoking status: Former Smoker    Packs/day: 0.50    Years: 6.00    Pack years: 3.00    Types: Cigarettes    Last attempt to quit: 11/23/2011    Years since quitting: 6.8  . Smokeless tobacco: Never Used  . Tobacco comment: quit summer 2013  Substance and Sexual Activity  . Alcohol use: No    Alcohol/week: 6.0 standard drinks    Types: 6 Cans of beer per week  . Drug use: No  . Sexual activity: Not Currently    Birth control/protection: Post-menopausal  Lifestyle  . Physical activity:    Days per week: Not on file    Minutes per session: Not on file  . Stress: Not on file  Relationships  . Social connections:    Talks on phone: Not on file    Gets together: Not on file    Attends religious service: Not on file    Active member of club or organization: Not on file    Attends meetings of  clubs or organizations: Not on file    Relationship status: Not on file  . Intimate partner violence:    Fear of current or ex partner: Not on file    Emotionally abused: Not on file    Physically abused: Not on file    Forced sexual activity: Not on file  Other Topics Concern  . Not on file  Social History Narrative   Lives with son, daughter and grandkids in a 2 story home but stays on the first floor.  Has 3 children.  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    FAMILY HISTORY: Family History  Problem Relation Age of Onset  . Diabetes Mother   . Hypertension Mother   . Heart disease Mother   . Alzheimer's disease Mother   . Diabetes Father   . Heart disease Sister   . Diabetes Sister     ALLERGIES:  is allergic to hydrocodone and no known allergies.  MEDICATIONS:  Current Outpatient Medications  Medication Sig Dispense Refill  . atorvastatin (LIPITOR) 40 MG tablet Take 40 mg by mouth daily.    . carvedilol (COREG) 25 MG tablet Take 25 mg by mouth daily.    Marland Kitchen ELIQUIS 5 MG TABS tablet TAKE 1 TABLET BY MOUTH TWICE A DAY 60 tablet 2   . enalapril (VASOTEC) 20 MG tablet Take 20 mg by mouth 2 (two) times daily.     Marland Kitchen gabapentin (NEURONTIN) 300 MG capsule Take 3 capsules (900 mg total) by mouth 3 (three) times daily. 270 capsule 11  . insulin degludec (TRESIBA FLEXTOUCH) 100 UNIT/ML SOPN FlexTouch Pen Inject 0.5 mLs (50 Units total) into the skin daily at 10 pm. 5 pen 2  . Insulin Pen Needle (B-D ULTRAFINE III SHORT PEN) 31G X 8 MM MISC 1 each by Does not apply route as directed. 100 each 3  . lenalidomide (REVLIMID) 5 MG capsule Take 1 capsule (5 mg total) by mouth daily. 3 weeks on 1 week off 21 capsule 0  . metFORMIN (GLUCOPHAGE) 1000 MG tablet Take 1,000 mg by mouth 2 (two) times daily.     Marland Kitchen spironolactone (ALDACTONE) 25 MG tablet Take 25 mg by mouth daily.    Marland Kitchen tiZANidine (ZANAFLEX) 4 MG tablet Take 1 tablet (4 mg total) by mouth every 6 (six) hours as needed for muscle spasms. 60 tablet 0  . Vitamin D, Ergocalciferol, (DRISDOL) 50000 units CAPS capsule Take by mouth.     No current facility-administered medications for this visit.     REVIEW OF SYSTEMS:    A 10+ POINT REVIEW OF SYSTEMS WAS OBTAINED including neurology, dermatology, psychiatry, cardiac, respiratory, lymph, extremities, GI, GU, Musculoskeletal, constitutional, breasts, reproductive, HEENT.  All pertinent positives are noted in the HPI.  All others are negative.   PHYSICAL EXAMINATION: ECOG PERFORMANCE STATUS: 1 - Symptomatic but completely ambulatory   There were no vitals filed for this visit. There were no vitals filed for this visit. .There is no height or weight on file to calculate BMI.  Phone visit  LABORATORY DATA:  I have reviewed the data as listed  . CBC Latest Ref Rng & Units 08/21/2018 07/17/2018 05/22/2018  WBC 4.0 - 10.5 K/uL 4.8 5.0 5.2  Hemoglobin 12.0 - 15.0 g/dL 10.8(L) 10.8(L) 10.6(L)  Hematocrit 36.0 - 46.0 % 33.1(L) 33.3(L) 32.6(L)  Platelets 150 - 400 K/uL 282 280 181    . CMP Latest Ref Rng & Units 08/21/2018 07/17/2018  05/22/2018  Glucose 70 - 99 mg/dL 110(H) 109(H) 96  BUN 6 - 20 mg/dL _0 Creatinine 0.44 - 1.00 mg/dL 1.14(H) 1.19(H) 1.13(H)  Sodium 135 - 145 mmol/L 140 141 142  Potassium 3.5 - 5.1 mmol/L 4.2 4.3 4.6  Chloride 98 - 111 mmol/L 112(H) 111 112(H)  CO2 22 - 32 mmol/L 23 23 20(L)  Calcium 8.9 - 10.3 mg/dL 9.1 8.9 9.1  Total Protein 6.5 - 8.1 g/dL 7.5 7.4 7.3  Total Bilirubin 0.3 - 1.2 mg/dL 0.5 0.5 0.5  Alkaline Phos 38 - 126 U/L 60 63 98  AST 15 - 41 U/L 17 11(L) 49(H)  ALT 0 - 44 U/L 15 10 63(H)   03/25/18 SPEP and SFLC:     04/21/17 Cytogenetics:    01/17/17 Cytogenetics:    RADIOGRAPHIC STUDIES: I have personally reviewed the radiological images as listed and agreed with the findings in the report. No results found.  10/13/17 PET/CT     ASSESSMENT & PLAN:  63 y.o. female with  1. Multiple myeloma Currently in remission Autologous PBSCT on 07/01/17. IgG kappa multiple myeloma. Cytogenetics studies demonstrated normal karyotype, FISH studies are positive for 13q34 & D13S319 loss and no loss of p53. Based on initial information, patient was staged at R-ISS II.  Completed 6 cycles of induction with Revlimid, Velcade and dexamethasone  2. Grade 2 neuropathy -controlled  02/26/18 NCV with EMG which revealed The electrophysiologic findings are most consistent with a chronic, length-dependent sensorimotor axonal and demyelinating polyneuropathy affecting the right side. Overall, these findings are moderate to severe in degree electrically. Incidentally, there is a right Martin-Gruber anastomosis, a normal variant.   3. Rt LE calf veins - possible clot ?chronic.  01/29/18 VAS [place] BLE which revealed Right: Ultrasound is unable to distinguish whether obstruction in the Peroneal veins, and great saphenous vein, and superficial veins/varciosities is acute or chronic. No cystic structure found in the popliteal fossa. Left: no evidence of DVT.   PLAN: -Discussed pt labwork from  08/21/18; blood counts and chemistries are stable. No M spike. -The pt shows no clinical or lab progression/return of her multiple myeloma at this time.  -The pt has no prohibitive toxicities from continuing maintenance 26m Revlimid, 3 weeks on, 1 week off, at this time. -Continue Zometa every 3 months   -Continue taking Vitamin B complex -Continue Acyclovir -Continue follow up with cardiology -Recommend crowd avoidance and infection prevention strategies -Will see the pt back in 4 weeks   RTC with Dr KIrene Limbowith labs in 4 weeks   All of the patients questions were answered with apparent satisfaction. The patient knows to call the clinic with any problems, questions or concerns.  The total time spent in the appt was 20 minutes and more than 50% was on counseling and direct patient cares.     GSullivan LoneMD MS AAHIVMS SRegency Hospital Of Cincinnati LLCCLovelace Regional Hospital - RoswellHematology/Oncology Physician CNatural [facility] (Office):       [phone](Work cell):  [phone](Fax):           [phone] 09/11/2018 12:40 PM  I, SBaldwin Jamaica am acting as a scribe for Dr. GSullivan Lone   .I have reviewed the above documentation for accuracy and completeness, and I agree with the above. .Brunetta GeneraMD

## 2018-09-11 ENCOUNTER — Other Ambulatory Visit: Payer: Medicare Other

## 2018-09-11 ENCOUNTER — Inpatient Hospital Stay (HOSPITAL_BASED_OUTPATIENT_CLINIC_OR_DEPARTMENT_OTHER): Payer: Medicare Other | Admitting: Hematology

## 2018-09-11 ENCOUNTER — Telehealth: Payer: Self-pay | Admitting: Hematology

## 2018-09-11 DIAGNOSIS — C9001 Multiple myeloma in remission: Secondary | ICD-10-CM

## 2018-09-11 NOTE — Telephone Encounter (Signed)
Called and scheduled appt per 3/27 los.  Patient did not answer.  Printed and mailed calendar.

## 2018-09-24 DIAGNOSIS — E7849 Other hyperlipidemia: Secondary | ICD-10-CM | POA: Diagnosis not present

## 2018-09-24 DIAGNOSIS — I10 Essential (primary) hypertension: Secondary | ICD-10-CM | POA: Diagnosis not present

## 2018-09-24 DIAGNOSIS — N182 Chronic kidney disease, stage 2 (mild): Secondary | ICD-10-CM | POA: Diagnosis not present

## 2018-09-24 DIAGNOSIS — J449 Chronic obstructive pulmonary disease, unspecified: Secondary | ICD-10-CM | POA: Diagnosis not present

## 2018-09-24 DIAGNOSIS — E1149 Type 2 diabetes mellitus with other diabetic neurological complication: Secondary | ICD-10-CM | POA: Diagnosis not present

## 2018-09-24 DIAGNOSIS — I5042 Chronic combined systolic (congestive) and diastolic (congestive) heart failure: Secondary | ICD-10-CM | POA: Diagnosis not present

## 2018-10-06 ENCOUNTER — Other Ambulatory Visit: Payer: Self-pay | Admitting: *Deleted

## 2018-10-06 MED ORDER — LENALIDOMIDE 5 MG PO CAPS: 5.0000 mg | ORAL_CAPSULE | Freq: Every day | ORAL | 0 refills | Status: DC

## 2018-10-06 NOTE — Telephone Encounter (Signed)
Refilled Revlimid per office visit note 09/11/2018. Revlimid refill sent to [place] [facility], [address] auth# [id number], 10/06/2018

## 2018-10-07 DIAGNOSIS — I10 Essential (primary) hypertension: Secondary | ICD-10-CM | POA: Diagnosis not present

## 2018-10-07 DIAGNOSIS — I351 Nonrheumatic aortic (valve) insufficiency: Secondary | ICD-10-CM | POA: Diagnosis not present

## 2018-10-07 DIAGNOSIS — I872 Venous insufficiency (chronic) (peripheral): Secondary | ICD-10-CM | POA: Diagnosis not present

## 2018-10-07 DIAGNOSIS — I25118 Atherosclerotic heart disease of native coronary artery with other forms of angina pectoris: Secondary | ICD-10-CM | POA: Diagnosis not present

## 2018-10-07 DIAGNOSIS — I5032 Chronic diastolic (congestive) heart failure: Secondary | ICD-10-CM | POA: Diagnosis not present

## 2018-10-07 DIAGNOSIS — I340 Nonrheumatic mitral (valve) insufficiency: Secondary | ICD-10-CM | POA: Diagnosis not present

## 2018-10-08 NOTE — Progress Notes (Signed)
HEMATOLOGY/ONCOLOGY CLINIC NOTE  Date of Service: 10/09/2018  Patient Care Team: Neale Burly, MD as PCP - General (Internal Medicine)  CHIEF COMPLAINTS/PURPOSE OF CONSULTATION:  F/u for continued mx of Multiple myeloma  Oncologic History:  63 y.o. female who initially presented with significant weight loss in the context of H. pylori-associated gastritis and alcohol abuse. At the same time, an incidental discovery of a lower thoracic vertebral mass in the context of chronic numbness and weakness in the lower extremities. MRI of the thoracic spine demonstrated a destructive lesion at T8 level and patient underwent surgical treatment. 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 Staging evaluation conducted by our clinic confirmed presence of IgG kappa multiple myeloma. Cytogenetics studies demonstrated normal karyotype, FISH studies are positive for 13q34 & D13S319 loss and no loss of p53. Based on initial information, patient was staged at R-ISS II.   Patient has completed 6 cycles of induction systemic chemotherapy with lenalidomide, bortezomib, and low-dose dexamethasone.  Repeat bone marrow biopsy demonstrated excellent response to treatment and patient subsequently underwent consolidation therapy including melphalan conditioning and autologous stem cell rescue.  Repeat assessment with PET/CT and bone marrow biopsy on 10/13/2017 demonstrates stringent complete response maintenance.  HISTORY OF PRESENTING ILLNESS:   Christine Garrett is a wonderful 62 y.o. female who has been referred to [place] by my colleague Dr Grace Isaac for evaluation and management of Multiple myeloma. The pt reports that she is doing well overall.   The pt has previously been followed by my colleague Dr Grace Isaac and completed 6 cycles of induction with Revlimid, Velcade and dexamethasone. She had a autologous PBSCT on 07/01/17. She is on lenalidomide maintenance currently.   The  pt reports that she has had a headache for the past week, noting a constant pounding in the front of her forehead. She denies fevers and is always cold. She is having clear, nasal discharge and seasonal allergies. She notes aspirin has alleviated her headache some.   She is continuing to take Revlimid on 21 day cycles.  Most recent lab results (11/21/17) of CBC and CMP is as follows: all values are WNL.  MMP 11/21/17 is shows no M spike   On review of systems, pt reports headache, moving her bowels well, increasing strength, and denies mouth sores, fevers, night sweats, and any other symptoms.  Interval History:   Christine Garrett returns today for management and evaluation of her Multiple Myeloma currently in remission. The patient's last visit with [place] was on 09/11/18. The pt reports that she is doing well overall.  The pt reports that she has not developed any new concerns in the interim. She notes that her back does hurt, which has been chronic and denies this changing any. She denies any problems tolerating her maintenance Revlimid and continues on Eliquis. She endorses some mild, stable ankle swelling. She notes that she has hot flashes every now and then, but denies fevers, chills, or drenching night sweats. She endorses stable energy levels. She notes that she was started on 81m Lasix by her cardiologist for a week, which dehydrated her and didn't resolve her ankle swelling. She endorses avoiding salt and notes that she wears compression socks.  Lab results today (10/09/18) of CBC w/diff and CMP is as follows: all values are WNL except for RBC at 3.71, HGB at 11.7, HCT at 35.7, CO2 at 21, Glucose at 114, Creatinine at 1.13, AST at 13, Total bilirubin at 0.2.  On review of  systems, pt reports stable energy levels, stable back pain, eating well, stable ankle swelling, and denies fevers, chills, drenching night sweats, abdominal pains, and any other symptoms.   MEDICAL HISTORY:  Past Medical  History:  Diagnosis Date  . Arthritis    knees  . Cancer ([place])   . Carpal tunnel syndrome   . CHF (congestive heart failure) ([place])   . Diabetes mellitus    type 2  . GERD (gastroesophageal reflux disease)   . Heart murmur    "Little, No concerns" per Dr  Dannielle Burn pt reported.  . Hypertension    controlled using a guided approch with plasma renin activity  . Multiple myeloma not having achieved remission ([place]) 01/06/2017  . Neuropathy   . Peripheral vascular disease ([place])   . Sleep apnea    Uses CPAP    SURGICAL HISTORY: Past Surgical History:  Procedure Laterality Date  . BIOPSY  11/28/2016   Procedure: BIOPSY;  Surgeon: Rogene Houston, MD;  [facility];  Service: Endoscopy;;  gastric  . [place]  . CERVICAL CONE BIOPSY     cervical lesion  . ESOPHAGOGASTRODUODENOSCOPY (EGD) WITH PROPOFOL N/A 11/28/2016   Procedure: ESOPHAGOGASTRODUODENOSCOPY (EGD) WITH PROPOFOL;  Surgeon: Rogene Houston, MD;  [facility];  Service: Endoscopy;  Laterality: N/A;  9:25  . IR FLUORO GUIDE PORT INSERTION RIGHT  01/29/2017  . IR US GUIDE VASC ACCESS RIGHT  01/29/2017  . lipoma removal     right shoulder 2001  . MULTIPLE EXTRACTIONS WITH ALVEOLOPLASTY Bilateral 08/24/2012   Procedure: MULTIPLE EXTRACTION WITH ALVEOLOPLASTY BIOPSY OF RIGHT AND LEFT MANDIBLE ;  Surgeon: Gae Bon, DDS;  Location: [facility];  Service: Oral Surgery;  Laterality: Bilateral;  . POSTERIOR LUMBAR FUSION 4 LEVEL N/A 12/26/2016   Procedure: THORACIC EIGHT TUMOR RESECTION, THORACIC SIX- THORACIC TEN POSTERIOR SPINAL FUSION;  Surgeon: Ashok Pall, MD;  Location: [facility];  Service: Neurosurgery;  Laterality: N/A;  THORACIC 8 TUMOR RESECTION, THORACIC 6- THORACIC 10 POSTERIOR SPINAL FUSION  . ROTATOR CUFF REPAIR     right shoulder  . TUBAL LIGATION      SOCIAL HISTORY: Social History   Socioeconomic History  . Marital status: Single    Spouse name: Not on file  . Number of children: 3  . Years  of education: Not on file  . Highest education level: Not on file  Occupational History  . Occupation: on disability  . Occupation: former Proofreader  . Financial resource strain: Not on file  . Food insecurity:    Worry: Not on file    Inability: Not on file  . Transportation needs:    Medical: Not on file    Non-medical: Not on file  Tobacco Use  . Smoking status: Former Smoker    Packs/day: 0.50    Years: 6.00    Pack years: 3.00    Types: Cigarettes    Last attempt to quit: 11/23/2011    Years since quitting: 6.8  . Smokeless tobacco: Never Used  . Tobacco comment: quit summer 2013  Substance and Sexual Activity  . Alcohol use: No    Alcohol/week: 6.0 standard drinks    Types: 6 Cans of beer per week  . Drug use: No  . Sexual activity: Not Currently    Birth control/protection: Post-menopausal  Lifestyle  . Physical activity:    Days per week: Not on file    Minutes per session: Not  on file  . Stress: Not on file  Relationships  . Social connections:    Talks on phone: Not on file    Gets together: Not on file    Attends religious service: Not on file    Active member of club or organization: Not on file    Attends meetings of clubs or organizations: Not on file    Relationship status: Not on file  . Intimate partner violence:    Fear of current or ex partner: Not on file    Emotionally abused: Not on file    Physically abused: Not on file    Forced sexual activity: Not on file  Other Topics Concern  . Not on file  Social History Narrative   Lives with son, daughter and grandkids in a 2 story home but stays on the first floor.  Has 3 children.  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    FAMILY HISTORY: Family History  Problem Relation Age of Onset  . Diabetes Mother   . Hypertension Mother   . Heart disease Mother   . Alzheimer's disease Mother   . Diabetes Father   . Heart disease Sister   . Diabetes Sister      ALLERGIES:  is allergic to hydrocodone and no known allergies.  MEDICATIONS:  Current Outpatient Medications  Medication Sig Dispense Refill  . atorvastatin (LIPITOR) 40 MG tablet Take 40 mg by mouth daily.    . carvedilol (COREG) 25 MG tablet Take 25 mg by mouth daily.    Marland Kitchen ELIQUIS 5 MG TABS tablet TAKE 1 TABLET BY MOUTH TWICE A DAY 60 tablet 2  . enalapril (VASOTEC) 20 MG tablet Take 20 mg by mouth 2 (two) times daily.     Marland Kitchen gabapentin (NEURONTIN) 300 MG capsule Take 3 capsules (900 mg total) by mouth 3 (three) times daily. 270 capsule 11  . insulin degludec (TRESIBA FLEXTOUCH) 100 UNIT/ML SOPN FlexTouch Pen Inject 0.5 mLs (50 Units total) into the skin daily at 10 pm. 5 pen 2  . Insulin Pen Needle (B-D ULTRAFINE III SHORT PEN) 31G X 8 MM MISC 1 each by Does not apply route as directed. 100 each 3  . lenalidomide (REVLIMID) 5 MG capsule Take 1 capsule (5 mg total) by mouth daily. 3 weeks on 1 week off 21 capsule 0  . metFORMIN (GLUCOPHAGE) 1000 MG tablet Take 1,000 mg by mouth 2 (two) times daily.     Marland Kitchen spironolactone (ALDACTONE) 25 MG tablet Take 25 mg by mouth daily.    Marland Kitchen tiZANidine (ZANAFLEX) 4 MG tablet Take 1 tablet (4 mg total) by mouth every 6 (six) hours as needed for muscle spasms. 60 tablet 0  . Vitamin D, Ergocalciferol, (DRISDOL) 50000 units CAPS capsule Take by mouth.     No current facility-administered medications for this visit.     REVIEW OF SYSTEMS:    A 10+ POINT REVIEW OF SYSTEMS WAS OBTAINED including neurology, dermatology, psychiatry, cardiac, respiratory, lymph, extremities, GI, GU, Musculoskeletal, constitutional, breasts, reproductive, HEENT.  All pertinent positives are noted in the HPI.  All others are negative.   PHYSICAL EXAMINATION: ECOG PERFORMANCE STATUS: 1 - Symptomatic but completely ambulatory   Vitals:   10/09/18 1000  BP: (!) 180/87  Pulse: 69  Resp: 18  Temp: 98 F (36.7 C)  SpO2: 100%   Filed Weights   10/09/18 1000  Weight: 229 lb  1.6 oz (103.9 kg)   .Body  mass index is 33.83 kg/m.  GENERAL:alert, in no acute distress and comfortable SKIN: no acute rashes, no significant lesions EYES: conjunctiva are pink and non-injected, sclera anicteric OROPHARYNX: MMM, no exudates, no oropharyngeal erythema or ulceration NECK: supple, no JVD LYMPH:  no palpable lymphadenopathy in the cervical, axillary or inguinal regions LUNGS: clear to auscultation b/l with normal respiratory effort HEART: regular rate & rhythm ABDOMEN:  normoactive bowel sounds , non tender, not distended. No palpable hepatosplenomegaly.  Extremity: 1+ pedal edema PSYCH: alert & oriented x 3 with fluent speech NEURO: no focal motor/sensory deficits   LABORATORY DATA:  I have reviewed the data as listed  . CBC Latest Ref Rng & Units 10/09/2018 08/21/2018 07/17/2018  WBC 4.0 - 10.5 K/uL 4.9 4.8 5.0  Hemoglobin 12.0 - 15.0 g/dL 11.7(L) 10.8(L) 10.8(L)  Hematocrit 36.0 - 46.0 % 35.7(L) 33.1(L) 33.3(L)  Platelets 150 - 400 K/uL 198 282 280    . CMP Latest Ref Rng & Units 10/09/2018 08/21/2018 07/17/2018  Glucose 70 - 99 mg/dL 114(H) 110(H) 109(H)  BUN 6 - 20 mg/dL _0 Creatinine 0.44 - 1.00 mg/dL 1.13(H) 1.14(H) 1.19(H)  Sodium 135 - 145 mmol/L 143 140 141  Potassium 3.5 - 5.1 mmol/L 4.6 4.2 4.3  Chloride 98 - 111 mmol/L 111 112(H) 111  CO2 22 - 32 mmol/L 21(L) 23 23  Calcium 8.9 - 10.3 mg/dL 9.4 9.1 8.9  Total Protein 6.5 - 8.1 g/dL 7.3 7.5 7.4  Total Bilirubin 0.3 - 1.2 mg/dL 0.2(L) 0.5 0.5  Alkaline Phos 38 - 126 U/L 66 60 63  AST 15 - 41 U/L 13(L) 17 11(L)  ALT 0 - 44 U/L _1 03/25/18 SPEP and SFLC:     04/21/17 Cytogenetics:    01/17/17 Cytogenetics:    RADIOGRAPHIC STUDIES: I have personally reviewed the radiological images as listed and agreed with the findings in the report. No results found.  10/13/17 PET/CT     ASSESSMENT & PLAN:  63 y.o. female with  1. Multiple myeloma Currently in remission Autologous PBSCT  on 07/01/17. IgG kappa multiple myeloma. Cytogenetics studies demonstrated normal karyotype, FISH studies are positive for 13q34 & D13S319 loss and no loss of p53. Based on initial information, patient was staged at R-ISS II.  Completed 6 cycles of induction with Revlimid, Velcade and dexamethasone  2. Grade 2 neuropathy -controlled  02/26/18 NCV with EMG which revealed The electrophysiologic findings are most consistent with a chronic, length-dependent sensorimotor axonal and demyelinating polyneuropathy affecting the right side. Overall, these findings are moderate to severe in degree electrically. Incidentally, there is a right Martin-Gruber anastomosis, a normal variant.   3. Rt LE calf veins - possible clot ?chronic.  01/29/18 VAS [place] BLE which revealed Right: Ultrasound is unable to distinguish whether obstruction in the Peroneal veins, and great saphenous vein, and superficial veins/varciosities is acute or chronic. No cystic structure found in the popliteal fossa. Left: no evidence of DVT.   PLAN: -Discussed pt labwork today, 10/09/18; HGB improved to 11.7, other blood counts stable. Chemistries are stable. -10/09/18 MMP is pending. Last available MMP from 08/21/18 revealed no M spike. -The pt shows no clinical or lab progression/return of her Multiple Myeloma at this time.  -The pt has no prohibitive toxicities from continuing maintenance 74m Revlimid 3 weeks on and 1 week off, at this time.  -Holding Acyclovir now as pt is 1 year out from transplant -Continue Eliquis -Continue Zometa every 3 months   -  Continue taking Vitamin B complex -Continue follow up with cardiology -Recommend crowd avoidance and infection prevention strategies -Will see the pt back in 2 months   RTC with Dr Irene Limbo with labs in 2 months -Continue Zometa every 3 months     All of the patients questions were answered with apparent satisfaction. The patient knows to call the clinic with any problems, questions or  concerns.  The total time spent in the appt was 20 minutes and more than 50% was on counseling and direct patient cares.     Sullivan Lone MD MS AAHIVMS [facility] Hematology/Oncology Physician [facility]  (Office):       [phone] (Work cell):  [phone] (Fax):           [phone]  10/09/2018 10:23 AM  I, Baldwin [place], am acting as a scribe for Dr. Sullivan Lone.   .I have reviewed the above documentation for accuracy and completeness, and I agree with the above. Brunetta Genera MD

## 2018-10-09 ENCOUNTER — Other Ambulatory Visit: Payer: Self-pay

## 2018-10-09 ENCOUNTER — Inpatient Hospital Stay: Payer: Medicare Other | Attending: Hematology and Oncology

## 2018-10-09 ENCOUNTER — Inpatient Hospital Stay (HOSPITAL_BASED_OUTPATIENT_CLINIC_OR_DEPARTMENT_OTHER): Payer: Medicare Other | Admitting: Hematology

## 2018-10-09 ENCOUNTER — Telehealth: Payer: Self-pay | Admitting: Hematology

## 2018-10-09 VITALS — BP 180/87 | HR 69 | Temp 98.0°F | Resp 18 | Ht 69.0 in | Wt 229.1 lb

## 2018-10-09 DIAGNOSIS — Z7901 Long term (current) use of anticoagulants: Secondary | ICD-10-CM

## 2018-10-09 DIAGNOSIS — M545 Low back pain: Secondary | ICD-10-CM | POA: Diagnosis not present

## 2018-10-09 DIAGNOSIS — N951 Menopausal and female climacteric states: Secondary | ICD-10-CM | POA: Insufficient documentation

## 2018-10-09 DIAGNOSIS — M25473 Effusion, unspecified ankle: Secondary | ICD-10-CM

## 2018-10-09 DIAGNOSIS — G8929 Other chronic pain: Secondary | ICD-10-CM

## 2018-10-09 DIAGNOSIS — Z87891 Personal history of nicotine dependence: Secondary | ICD-10-CM | POA: Diagnosis not present

## 2018-10-09 DIAGNOSIS — C9001 Multiple myeloma in remission: Secondary | ICD-10-CM

## 2018-10-09 LAB — CBC WITH DIFFERENTIAL/PLATELET
Abs Immature Granulocytes: 0.02 10*3/uL (ref 0.00–0.07)
Basophils Absolute: 0.1 10*3/uL (ref 0.0–0.1)
Basophils Relative: 2 %
Eosinophils Absolute: 0.4 10*3/uL (ref 0.0–0.5)
Eosinophils Relative: 8 %
HCT: 35.7 % — ABNORMAL LOW (ref 36.0–46.0)
Hemoglobin: 11.7 g/dL — ABNORMAL LOW (ref 12.0–15.0)
Immature Granulocytes: 0 %
Lymphocytes Relative: 39 %
Lymphs Abs: 1.9 10*3/uL (ref 0.7–4.0)
MCH: 31.5 pg (ref 26.0–34.0)
MCHC: 32.8 g/dL (ref 30.0–36.0)
MCV: 96.2 fL (ref 80.0–100.0)
Monocytes Absolute: 0.5 10*3/uL (ref 0.1–1.0)
Monocytes Relative: 11 %
Neutro Abs: 1.9 10*3/uL (ref 1.7–7.7)
Neutrophils Relative %: 40 %
Platelets: 198 10*3/uL (ref 150–400)
RBC: 3.71 MIL/uL — ABNORMAL LOW (ref 3.87–5.11)
RDW: 14.7 % (ref 11.5–15.5)
WBC: 4.9 10*3/uL (ref 4.0–10.5)
nRBC: 0 % (ref 0.0–0.2)

## 2018-10-09 LAB — CMP (CANCER CENTER ONLY)
ALT: 11 U/L (ref 0–44)
AST: 13 U/L — ABNORMAL LOW (ref 15–41)
Albumin: 3.5 g/dL (ref 3.5–5.0)
Alkaline Phosphatase: 66 U/L (ref 38–126)
Anion gap: 11 (ref 5–15)
BUN: 10 mg/dL (ref 6–20)
CO2: 21 mmol/L — ABNORMAL LOW (ref 22–32)
Calcium: 9.4 mg/dL (ref 8.9–10.3)
Chloride: 111 mmol/L (ref 98–111)
Creatinine: 1.13 mg/dL — ABNORMAL HIGH (ref 0.44–1.00)
GFR, Est AFR Am: >60 mL/min (ref >60)
GFR, Estimated: 53 mL/min — ABNORMAL LOW (ref >60)
Glucose, Bld: 114 mg/dL — ABNORMAL HIGH (ref 70–99)
Potassium: 4.6 mmol/L (ref 3.5–5.1)
Sodium: 143 mmol/L (ref 135–145)
Total Bilirubin: 0.2 mg/dL — ABNORMAL LOW (ref 0.3–1.2)
Total Protein: 7.3 g/dL (ref 6.5–8.1)

## 2018-10-09 NOTE — Telephone Encounter (Signed)
Scheduled appt per 4/24 los. ° °A calendar will be mailed out. °

## 2018-10-12 LAB — MULTIPLE MYELOMA PANEL, SERUM
Albumin SerPl Elph-Mcnc: 3.4 g/dL (ref 2.9–4.4)
Albumin/Glob SerPl: 1.1 (ref 0.7–1.7)
Alpha 1: 0.2 g/dL (ref 0.0–0.4)
Alpha2 Glob SerPl Elph-Mcnc: 0.7 g/dL (ref 0.4–1.0)
B-Globulin SerPl Elph-Mcnc: 1.1 g/dL (ref 0.7–1.3)
Gamma Glob SerPl Elph-Mcnc: 1.2 g/dL (ref 0.4–1.8)
Globulin, Total: 3.3 g/dL (ref 2.2–3.9)
IgA: 213 mg/dL (ref 87–352)
IgG (Immunoglobin G), Serum: 1225 mg/dL (ref 586–1602)
IgM (Immunoglobulin M), Srm: 44 mg/dL (ref 26–217)
Total Protein ELP: 6.7 g/dL (ref 6.0–8.5)

## 2018-11-02 IMAGING — CT NM PET IMAGE RESTAGE (PS) WHOLE BODY
1 of 8 series · 3 of 25 positions shown · non-contrast
Comparison: PET-CT 01/16/2017. Thoracic MRI 12/17/2016 and
abdominopelvic CT 12/16/2016.

CLINICAL DATA: Subsequent treatment strategy for multiple myeloma.

EXAM:
NUCLEAR MEDICINE PET WHOLE BODY
TECHNIQUE: 10.55 mCi F-18 FDG was injected intravenously. Full-ring PET imaging
was performed from the vertex to the feet after the radiotracer. CT
data was obtained and used for attenuation correction and anatomic
localization.
FASTING BLOOD GLUCOSE:  Value: 123 mg/dl

[Series 5: ct wb 5.0 hd_fov · axial · 5.0mm · 1.07mm/px · z∈[+124,+1036]mm · 3 of 452 slices shown]
[im 113/452  soft-tissue]
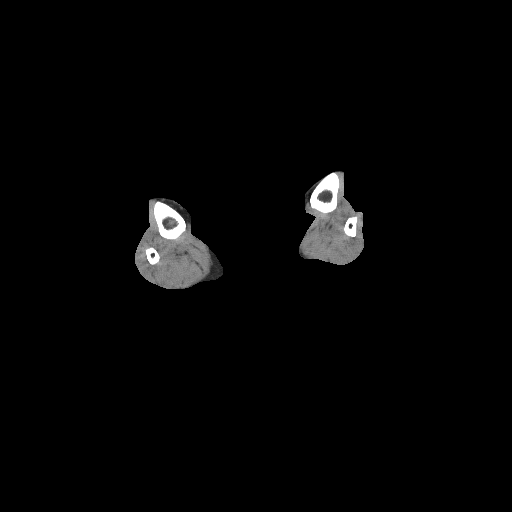
[im 226/452  soft-tissue]
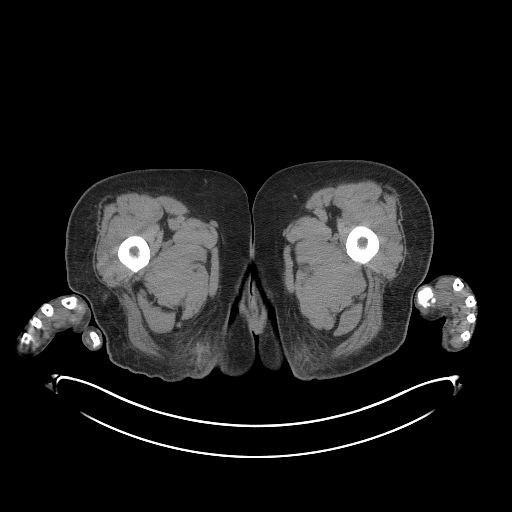
[im 339/452  soft-tissue]
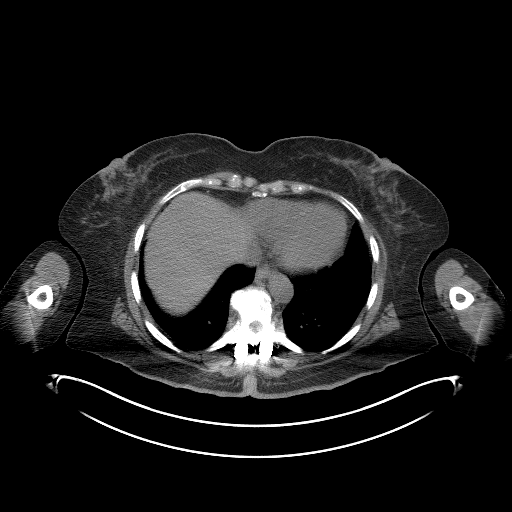

[3 of 25 positions shown; findings below may reference images not displayed]

FINDINGS: HEAD/NECK

No hypermetabolic cervical lymph nodes are identified.There are no
lesions of the pharyngeal mucosal space. No intracranial or
calvarial abnormality identified.

CHEST

There are no hypermetabolic mediastinal, hilar or axillary lymph
nodes. There is no suspicious pulmonary activity. Right IJ
Port-A-Cath extends to the superior cavoatrial junction. Coronary
artery calcifications are demonstrated. The lungs are clear.

ABDOMEN/PELVIS

There is no hypermetabolic activity within the liver, adrenal
glands, spleen or pancreas. There is no hypermetabolic nodal
activity. Aortoiliac atherosclerosis and a small left renal calculus
are noted.

SKELETON

There is no hypermetabolic activity to suggest osseous metastatic
disease. There is low-level postsurgical activity and artifact
related to previous thoracic fusion. There are degenerative changes
throughout the lumbar spine, sacroiliac joints and right hip.

EXTREMITIES

No residual suspicious hypermetabolic activity within the
extremities. The small foci of hypermetabolic activity previously
demonstrated in the proximal right tibial diaphysis have improved.
The greatest remaining activity has an SUV max of 1.83. No new
lesions identified.
IMPRESSION: 1. No residual hypermetabolic activity is demonstrated to suggest
metabolically active multiple myeloma. The previously demonstrated
low-level activity in the proximal right tibia has improved and is
nonspecific.
2. Postsurgical changes in the thoracic spine status post fusion.
3.  Aortic Atherosclerosis ([id number]).

## 2018-11-03 ENCOUNTER — Other Ambulatory Visit: Payer: Self-pay | Admitting: *Deleted

## 2018-11-03 MED ORDER — LENALIDOMIDE 5 MG PO CAPS: 5.0000 mg | ORAL_CAPSULE | Freq: Every day | ORAL | 0 refills | Status: DC

## 2018-11-03 NOTE — Telephone Encounter (Signed)
Refill Revlimid per OV note 10/09/2018. Refill sent to [place], [place]. [facility] [phone], 11/03/2018

## 2018-11-08 IMAGING — CT CT BIOPSY AND ASPIRATION BONE MARROW
1 of 2 series · 15 of 29 positions shown, 19 images · non-contrast
Comparison: none

CLINICAL DATA: Multiple myeloma not having achieved remission.
Follow-up bone marrow biopsy.

EXAM:
CT GUIDED DEEP ILIAC BONE ASPIRATION AND CORE BIOPSY
TECHNIQUE: The procedure, risks (including but not limited to bleeding,
infection, organ damage ), benefits, and alternatives were explained
to the patient. Questions regarding the procedure were encouraged
and answered. The patient understands and consents to the procedure.
Patient was placed supine on the CT gantry and limited axial scans
through the pelvis were obtained. Appropriate skin entry site was
identified. Skin site was marked, prepped with chlorhexidine, draped
in usual sterile fashion, and infiltrated locally with 1% lidocaine.

[Series 2: i-spiral 5.0 b40f · axial · 0.79mm/px · z∈[-127,-39]mm · 15 of 29 slices shown, 19 images]
[im 2/29  mediastinal]
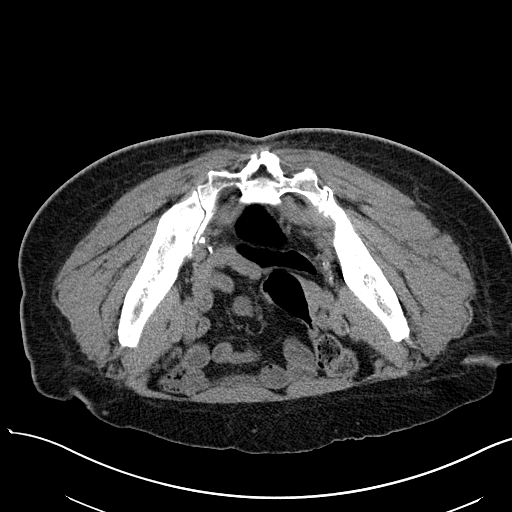
[im 2/29  lung]
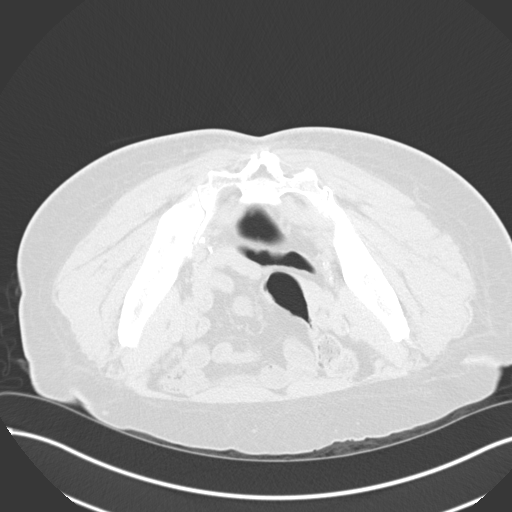
[im 3/29  lung]
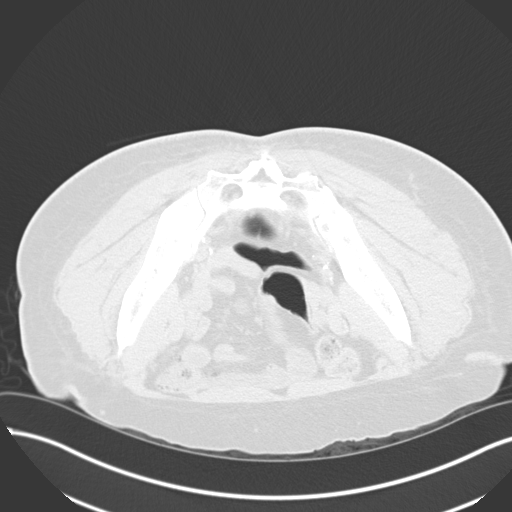
[im 6/29  lung]
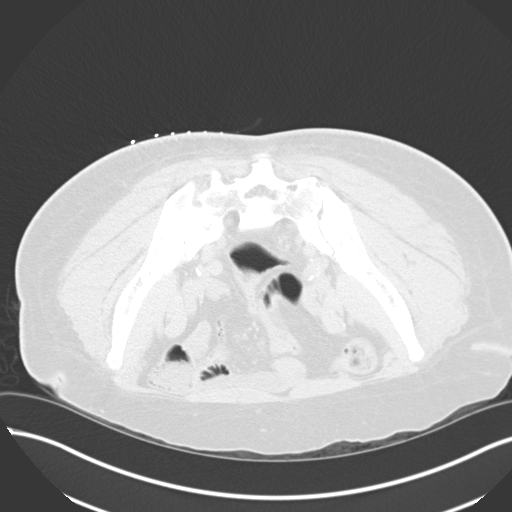
[im 8/29  lung]
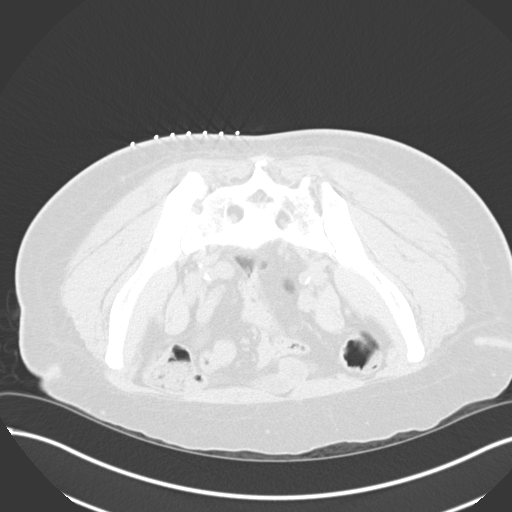
[im 9/29  mediastinal]
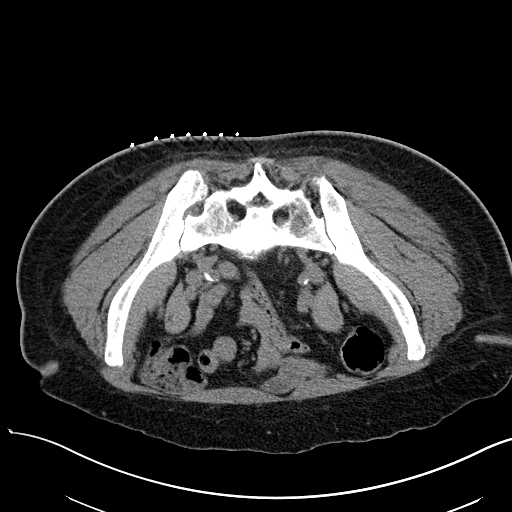
[im 9/29  lung]
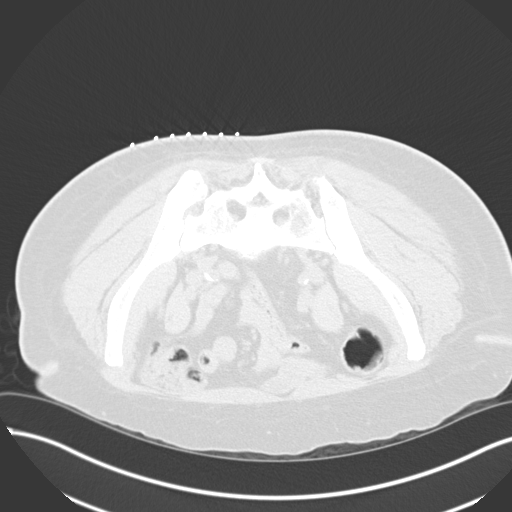
[im 10/29  lung]
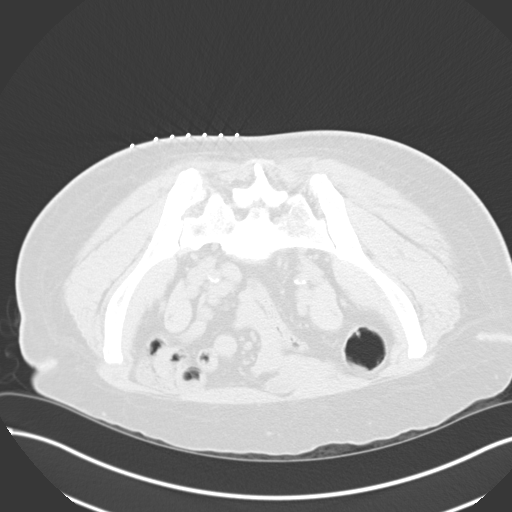
[im 13/29  lung]
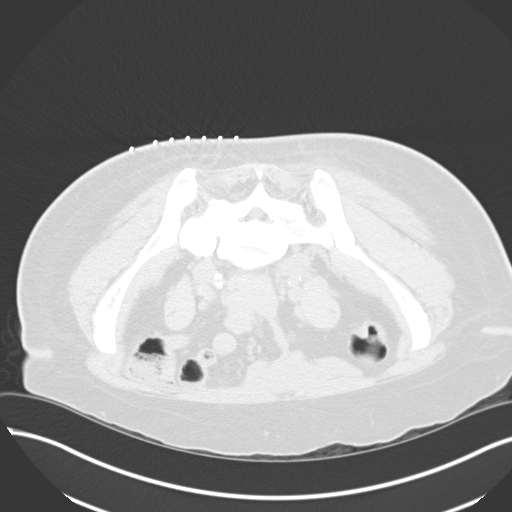
[im 15/29  lung]
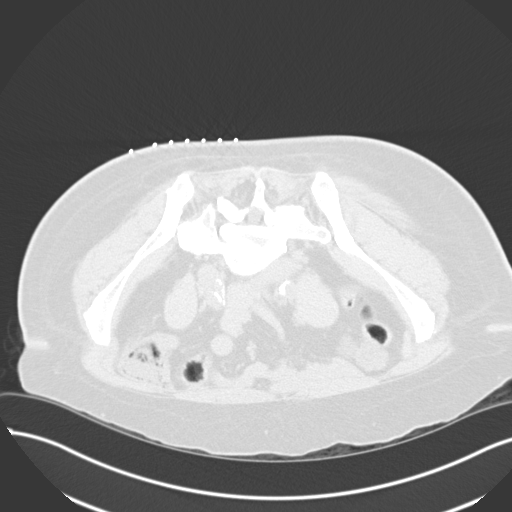
[im 16/29  mediastinal]
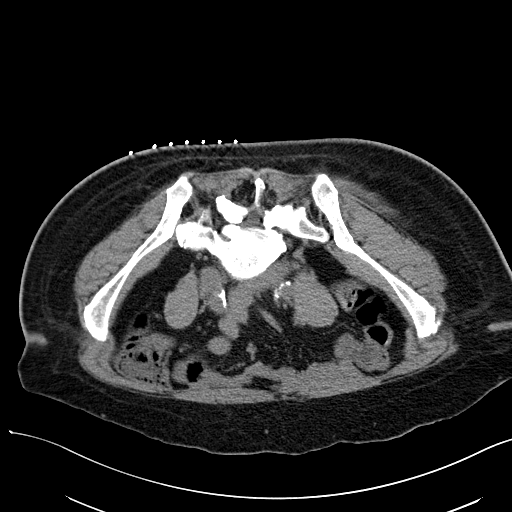
[im 16/29  lung]
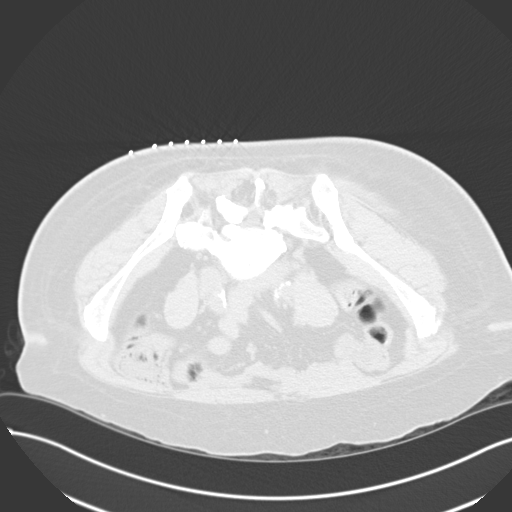
[im 19/29  lung]
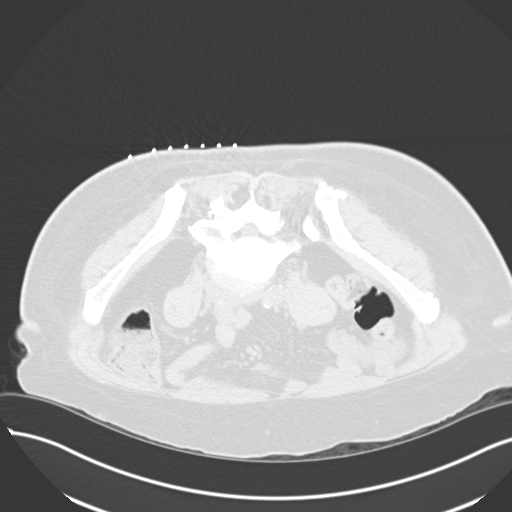
[im 20/29  lung]
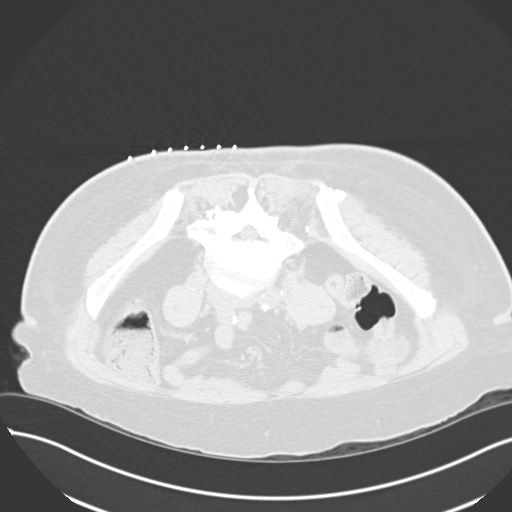
[im 22/29  lung]
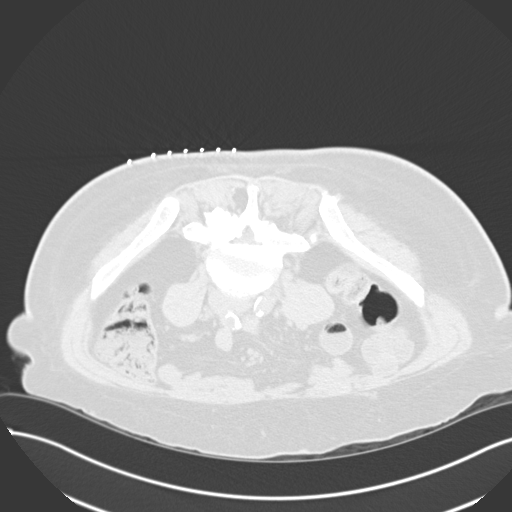
[im 23/29  mediastinal]
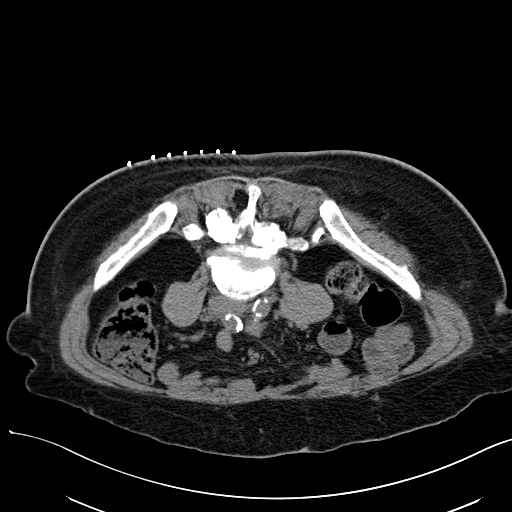
[im 23/29  lung]
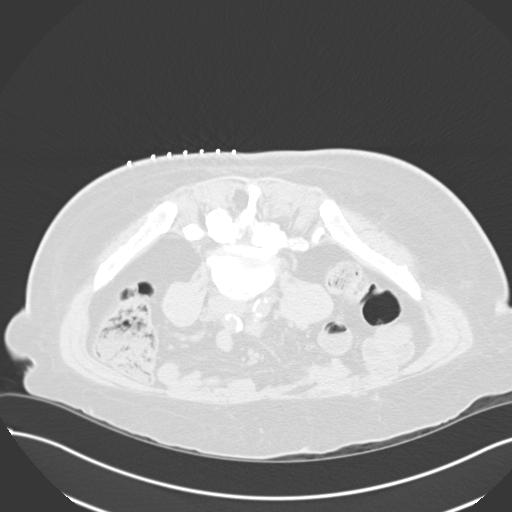
[im 26/29  lung]
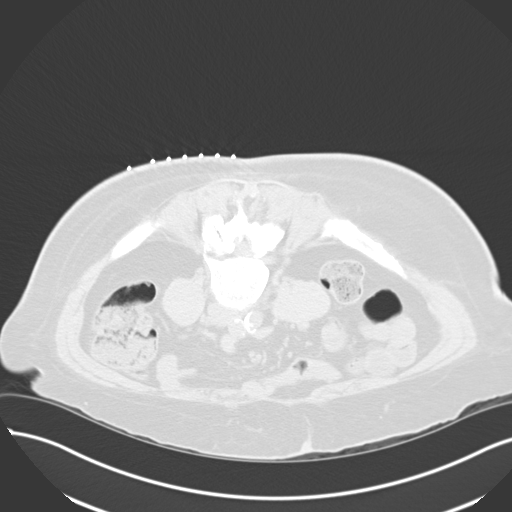
[im 27/29  lung]
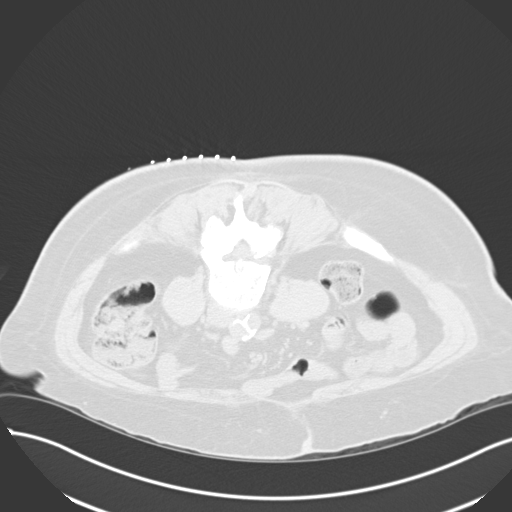

[15 of 29 positions shown; findings below may reference images not displayed]

Intravenous Fentanyl and Versed were administered as conscious
sedation during continuous monitoring of the patient's level of
consciousness and physiological / cardiorespiratory status by the
radiology RN, with a total moderate sedation time of 10 minutes.

Under CT fluoroscopic guidance an 11-gauge Cook trocar bone needle
was advanced into the right iliac bone just lateral to the
sacroiliac joint. Once needle tip position was confirmed, core and
aspiration samples were obtained, submitted to pathology for
approval. Post procedure scans show no hematoma or fracture. Patient
tolerated procedure well.

COMPLICATIONS:
COMPLICATIONS
none
IMPRESSION: 1. Technically successful CT guided right iliac bone core and
aspiration biopsy.

## 2018-11-16 ENCOUNTER — Other Ambulatory Visit: Payer: Self-pay | Admitting: Hematology

## 2018-11-30 ENCOUNTER — Other Ambulatory Visit: Payer: Self-pay | Admitting: *Deleted

## 2018-11-30 MED ORDER — LENALIDOMIDE 5 MG PO CAPS: 5.0000 mg | ORAL_CAPSULE | Freq: Every day | ORAL | 0 refills | Status: DC

## 2018-11-30 NOTE — Telephone Encounter (Signed)
Refill Revlimid per OV notes 10/09/2018. Refill sent to [place], [place]. [place] #[id number]

## 2018-12-03 ENCOUNTER — Encounter: Payer: Self-pay | Admitting: Neurology

## 2018-12-08 NOTE — Progress Notes (Signed)
HEMATOLOGY/ONCOLOGY CLINIC NOTE  Date of Service: 12/09/2018  Patient Care Team: Neale Burly, MD as PCP - General (Internal Medicine)  CHIEF COMPLAINTS/PURPOSE OF CONSULTATION:  F/u for continued mx of Multiple myeloma  Oncologic History:  63 y.o. female who initially presented with significant weight loss in the context of H. pylori-associated gastritis and alcohol abuse. At the same time, an incidental discovery of a lower thoracic vertebral mass in the context of chronic numbness and weakness in the lower extremities. MRI of the thoracic spine demonstrated a destructive lesion at T8 level and patient underwent surgical treatment. 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 Staging evaluation conducted by our clinic confirmed presence of IgG kappa multiple myeloma. Cytogenetics studies demonstrated normal karyotype, FISH studies are positive for 13q34 & D13S319 loss and no loss of p53. Based on initial information, patient was staged at [facility].   Patient has completed 6 cycles of induction systemic chemotherapy with lenalidomide, bortezomib, and low-dose dexamethasone.  Repeat bone marrow biopsy demonstrated excellent response to treatment and patient subsequently underwent consolidation therapy including melphalan conditioning and autologous stem cell rescue.  Repeat assessment with PET/CT and bone marrow biopsy on 10/13/2017 demonstrates stringent complete response maintenance.  HISTORY OF PRESENTING ILLNESS:   Christine Garrett is a wonderful 63 y.o. female who has been referred to [place] by my colleague Dr Grace Isaac for evaluation and management of Multiple myeloma. The pt reports that she is doing well overall.   The pt has previously been followed by my colleague Dr Grace Isaac and completed 6 cycles of induction with Revlimid, Velcade and dexamethasone. She had a autologous PBSCT on 07/01/17. She is on lenalidomide maintenance currently.   The  pt reports that she has had a headache for the past week, noting a constant pounding in the front of her forehead. She denies fevers and is always cold. She is having clear, nasal discharge and seasonal allergies. She notes aspirin has alleviated her headache some.   She is continuing to take Revlimid on 21 day cycles.  Most recent lab results (11/21/17) of CBC and CMP is as follows: all values are WNL.  MMP 11/21/17 is shows no M spike   On review of systems, pt reports headache, moving her bowels well, increasing strength, and denies mouth sores, fevers, night sweats, and any other symptoms.  Interval History:   Christine Garrett returns today for management and evaluation of her Multiple Myeloma currently in remission. The patient's last visit with [place] was on 10/09/18. The pt reports that she is doing well overall.  The pt reports she has been having new lower back pain in the interim, which she says is tender to the touch. She notes that she has been watching a lot of TV and has been taking care of her 29 year old granddaughter. The pt notes that she has been having more constant back pain. She has continued to have peripheral neuropathy in her legs and feet and continues following with neurology.   The pt notes that she has been eating well and endorses stable weight. She endorses environmental allergies.   The pt denies any problems tolerating maintenance Revlimid. She notes that she has had some soft stools but not diarrhea. She also denies blood in the stools or mucous in the stools.  Lab results today (12/09/18) of CBC w/diff is as follows: all values are WNL except for RBC at 3.83. 12/09/18 CMP is pending 12/09/18 MMP and SFLC are pending  On review of systems, pt reports new lower back pain with tenderness, stable energy levels, and denies diarrhea, skin rashes, blood in the stools mucous in the stools, leg swelling, abdominal pains, and any other symptoms.    MEDICAL HISTORY:  Past  Medical History:  Diagnosis Date  . Arthritis    knees  . Cancer ([place])   . Carpal tunnel syndrome   . CHF (congestive heart failure) ([place])   . Diabetes mellitus    type 2  . GERD (gastroesophageal reflux disease)   . Heart murmur    "Little, No concerns" per Dr  Dannielle Burn pt reported.  . Hypertension    controlled using a guided approch with plasma renin activity  . Multiple myeloma not having achieved remission ([place]) 01/06/2017  . Neuropathy   . Peripheral vascular disease ([place])   . Sleep apnea    Uses CPAP    SURGICAL HISTORY: Past Surgical History:  Procedure Laterality Date  . BIOPSY  11/28/2016   Procedure: BIOPSY;  Surgeon: Rogene Houston, MD;  [facility];  Service: Endoscopy;;  gastric  . [place]  . CERVICAL CONE BIOPSY     cervical lesion  . ESOPHAGOGASTRODUODENOSCOPY (EGD) WITH PROPOFOL N/A 11/28/2016   Procedure: ESOPHAGOGASTRODUODENOSCOPY (EGD) WITH PROPOFOL;  Surgeon: Rogene Houston, MD;  [facility];  Service: Endoscopy;  Laterality: N/A;  9:25  . IR FLUORO GUIDE PORT INSERTION RIGHT  01/29/2017  . IR US GUIDE VASC ACCESS RIGHT  01/29/2017  . lipoma removal     right shoulder 2001  . MULTIPLE EXTRACTIONS WITH ALVEOLOPLASTY Bilateral 08/24/2012   Procedure: MULTIPLE EXTRACTION WITH ALVEOLOPLASTY BIOPSY OF RIGHT AND LEFT MANDIBLE ;  Surgeon: Gae Bon, DDS;  Location: [facility];  Service: Oral Surgery;  Laterality: Bilateral;  . POSTERIOR LUMBAR FUSION 4 LEVEL N/A 12/26/2016   Procedure: THORACIC EIGHT TUMOR RESECTION, THORACIC SIX- THORACIC TEN POSTERIOR SPINAL FUSION;  Surgeon: Ashok Pall, MD;  Location: [facility];  Service: Neurosurgery;  Laterality: N/A;  THORACIC 8 TUMOR RESECTION, THORACIC 6- THORACIC 10 POSTERIOR SPINAL FUSION  . ROTATOR CUFF REPAIR     right shoulder  . TUBAL LIGATION      SOCIAL HISTORY: Social History   Socioeconomic History  . Marital status: Single    Spouse name: Not on file  . Number of children: 3   . Years of education: Not on file  . Highest education level: Not on file  Occupational History  . Occupation: on disability  . Occupation: former Proofreader  . Financial resource strain: Not on file  . Food insecurity    Worry: Not on file    Inability: Not on file  . Transportation needs    Medical: Not on file    Non-medical: Not on file  Tobacco Use  . Smoking status: Former Smoker    Packs/day: 0.50    Years: 6.00    Pack years: 3.00    Types: Cigarettes    Quit date: 11/23/2011    Years since quitting: 7.0  . Smokeless tobacco: Never Used  . Tobacco comment: quit summer 2013  Substance and Sexual Activity  . Alcohol use: No    Alcohol/week: 6.0 standard drinks    Types: 6 Cans of beer per week  . Drug use: No  . Sexual activity: Not Currently    Birth control/protection: Post-menopausal  Lifestyle  . Physical activity    Days per week: Not on  file    Minutes per session: Not on file  . Stress: Not on file  Relationships  . Social Herbalist on phone: Not on file    Gets together: Not on file    Attends religious service: Not on file    Active member of club or organization: Not on file    Attends meetings of clubs or organizations: Not on file    Relationship status: Not on file  . Intimate partner violence    Fear of current or ex partner: Not on file    Emotionally abused: Not on file    Physically abused: Not on file    Forced sexual activity: Not on file  Other Topics Concern  . Not on file  Social History Narrative   Lives with son, daughter and grandkids in a 2 story home but stays on the first floor.  Has 3 children.  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    FAMILY HISTORY: Family History  Problem Relation Age of Onset  . Diabetes Mother   . Hypertension Mother   . Heart disease Mother   . Alzheimer's disease Mother   . Diabetes Father   . Heart disease Sister   . Diabetes Sister     ALLERGIES:   is allergic to hydrocodone and no known allergies.  MEDICATIONS:  Current Outpatient Medications  Medication Sig Dispense Refill  . atorvastatin (LIPITOR) 40 MG tablet Take 40 mg by mouth daily.    . carvedilol (COREG) 25 MG tablet Take 25 mg by mouth daily.    Marland Kitchen ELIQUIS 5 MG TABS tablet TAKE 1 TABLET BY MOUTH TWICE A DAY 60 tablet 2  . enalapril (VASOTEC) 20 MG tablet Take 20 mg by mouth 2 (two) times daily.     Marland Kitchen gabapentin (NEURONTIN) 300 MG capsule Take 3 capsules (900 mg total) by mouth 3 (three) times daily. 270 capsule 11  . insulin degludec (TRESIBA FLEXTOUCH) 100 UNIT/ML SOPN FlexTouch Pen Inject 0.5 mLs (50 Units total) into the skin daily at 10 pm. 5 pen 2  . Insulin Pen Needle (B-D ULTRAFINE III SHORT PEN) 31G X 8 MM MISC 1 each by Does not apply route as directed. 100 each 3  . lenalidomide (REVLIMID) 5 MG capsule Take 1 capsule (5 mg total) by mouth daily. 3 weeks on 1 week off 21 capsule 0  . metFORMIN (GLUCOPHAGE) 1000 MG tablet Take 1,000 mg by mouth 2 (two) times daily.     Marland Kitchen spironolactone (ALDACTONE) 25 MG tablet Take 25 mg by mouth daily.    Marland Kitchen tiZANidine (ZANAFLEX) 4 MG tablet Take 1 tablet (4 mg total) by mouth every 6 (six) hours as needed for muscle spasms. 60 tablet 0  . Vitamin D, Ergocalciferol, (DRISDOL) 50000 units CAPS capsule Take by mouth.     No current facility-administered medications for this visit.     REVIEW OF SYSTEMS:    A 10+ POINT REVIEW OF SYSTEMS WAS OBTAINED including neurology, dermatology, psychiatry, cardiac, respiratory, lymph, extremities, GI, GU, Musculoskeletal, constitutional, breasts, reproductive, HEENT.  All pertinent positives are noted in the HPI.  All others are negative.   PHYSICAL EXAMINATION: ECOG PERFORMANCE STATUS: 1 - Symptomatic but completely ambulatory   Vitals:   12/09/18 1015  BP: (!) 189/82  Pulse: 77  Resp: 18  Temp: 98.5 F (36.9 C)  SpO2: 100%   Filed Weights   12/09/18 1015  Weight: 228 lb  14.4 oz  (103.8 kg)   .Body mass index is 33.8 kg/m.  GENERAL:alert, in no acute distress and comfortable SKIN: no acute rashes, no significant lesions EYES: conjunctiva are pink and non-injected, sclera anicteric OROPHARYNX: MMM, no exudates, no oropharyngeal erythema or ulceration NECK: supple, no JVD LYMPH:  no palpable lymphadenopathy in the cervical, axillary or inguinal regions LUNGS: clear to auscultation b/l with normal respiratory effort HEART: regular rate & rhythm ABDOMEN:  normoactive bowel sounds , non tender, not distended. No palpable hepatosplenomegaly.  Extremity: 1+ pedal edema PSYCH: alert & oriented x 3 with fluent speech NEURO: no focal motor/sensory deficits   LABORATORY DATA:  I have reviewed the data as listed  . CBC Latest Ref Rng & Units 12/09/2018 10/09/2018 08/21/2018  WBC 4.0 - 10.5 K/uL 4.7 4.9 4.8  Hemoglobin 12.0 - 15.0 g/dL 12.2 11.7(L) 10.8(L)  Hematocrit 36.0 - 46.0 % 37.4 35.7(L) 33.1(L)  Platelets 150 - 400 K/uL 224 198 282    . CMP Latest Ref Rng & Units 10/09/2018 08/21/2018 07/17/2018  Glucose 70 - 99 mg/dL 114(H) 110(H) 109(H)  BUN 6 - 20 mg/dL '10 17 15  ' Creatinine 0.44 - 1.00 mg/dL 1.13(H) 1.14(H) 1.19(H)  Sodium 135 - 145 mmol/L 143 140 141  Potassium 3.5 - 5.1 mmol/L 4.6 4.2 4.3  Chloride 98 - 111 mmol/L 111 112(H) 111  CO2 22 - 32 mmol/L 21(L) 23 23  Calcium 8.9 - 10.3 mg/dL 9.4 9.1 8.9  Total Protein 6.5 - 8.1 g/dL 7.3 7.5 7.4  Total Bilirubin 0.3 - 1.2 mg/dL 0.2(L) 0.5 0.5  Alkaline Phos 38 - 126 U/L 66 60 63  AST 15 - 41 U/L 13(L) 17 11(L)  ALT 0 - 44 U/L '11 15 10   ' 03/25/18 SPEP and SFLC:     04/21/17 Cytogenetics:    01/17/17 Cytogenetics:    RADIOGRAPHIC STUDIES: I have personally reviewed the radiological images as listed and agreed with the findings in the report. No results found.  10/13/17 PET/CT     ASSESSMENT & PLAN:  63 y.o. female with  1. Multiple myeloma Currently in remission Autologous PBSCT on 07/01/17.  IgG kappa multiple myeloma. Cytogenetics studies demonstrated normal karyotype, FISH studies are positive for 13q34 & D13S319 loss and no loss of p53. Based on initial information, patient was staged at [facility].  Completed 6 cycles of induction with Revlimid, Velcade and dexamethasone  2. Grade 2 neuropathy -controlled  02/26/18 NCV with EMG which revealed The electrophysiologic findings are most consistent with a chronic, length-dependent sensorimotor axonal and demyelinating polyneuropathy affecting the right side. Overall, these findings are moderate to severe in degree electrically. Incidentally, there is a right Martin-Gruber anastomosis, a normal variant.   3. Rt LE calf veins - possible clot ?chronic.  01/29/18 VAS [place] BLE which revealed Right: Ultrasound is unable to distinguish whether obstruction in the Peroneal veins, and great saphenous vein, and superficial veins/varciosities is acute or chronic. No cystic structure found in the popliteal fossa. Left: no evidence of DVT.   PLAN: -Discussed pt labwork today, 12/09/18; blood counts normalized. Chemistries are pending. -12/09/18 MMP and SFLC showed continued remission. Last available 10/09/18 MMP did not observe an M spike. -Will order an XR of patient's lower back given new back pain. If negative, recommend pt increase her activity levels. If concerning, will order an MRI. -The pt shows no clinical or lab progression/return of her multiple myeloma at this time.  -The pt has no prohibitive toxicities from continuing maintenance  42mRevlimid 3 weeks on and 1 week off, at this time. -Holding Acyclovir now as pt is 1 year out from transplant -Continue Eliquis -Continue Zometa every 3 months   -Continue taking Vitamin B complex -Continue follow up with cardiology -Recommend crowd avoidance and infection prevention strategies -Will see the pt back in 2 months   Xray Lspine today RTC with Dr KIrene Limbowith labs in 2 months -Continue Zometa  every 3 months     All of the patients questions were answered with apparent satisfaction. The patient knows to call the clinic with any problems, questions or concerns.  The total time spent in the appt was 25 minutes and more than 50% was on counseling and direct patient cares.    GSullivan LoneMD MS AAHIVMS SLake District HospitalCMaria Parham Medical CenterHematology/Oncology Physician CColumbia [facility] (Office):       [phone](Work cell):  [phone](Fax):           [phone] 12/09/2018 10:37 AM  I, SBaldwin Jamaica am acting as a scribe for Dr. GSullivan Lone   .I have reviewed the above documentation for accuracy and completeness, and I agree with the above. .Brunetta GeneraMD   ADDENDUM  IMPRESSION: 1. No acute osseous abnormality. 2. Extensive multilevel degenerative changes are noted throughout the lumbar spine.   IMPRESSION: No acute osseous abnormality. Degenerative changes are noted of both Hips.  X-Rays reviewed- no fractures and no myeloma lesions. Degenerative changes.

## 2018-12-09 ENCOUNTER — Inpatient Hospital Stay: Payer: Medicare Other | Attending: Hematology and Oncology

## 2018-12-09 ENCOUNTER — Inpatient Hospital Stay (HOSPITAL_BASED_OUTPATIENT_CLINIC_OR_DEPARTMENT_OTHER): Payer: Medicare Other | Admitting: Hematology

## 2018-12-09 ENCOUNTER — Telehealth: Payer: Self-pay | Admitting: Hematology

## 2018-12-09 ENCOUNTER — Ambulatory Visit (HOSPITAL_COMMUNITY)
Admission: RE | Admit: 2018-12-09 | Discharge: 2018-12-09 | Disposition: A | Discharge status: Home / Self Care | Payer: Medicare Other | Source: Ambulatory Visit | Attending: Hematology | Admitting: Hematology

## 2018-12-09 ENCOUNTER — Other Ambulatory Visit: Payer: Self-pay

## 2018-12-09 ENCOUNTER — Inpatient Hospital Stay: Payer: Medicare Other

## 2018-12-09 VITALS — BP 189/82 | HR 77 | Temp 98.5°F | Resp 18 | Ht 69.0 in | Wt 228.9 lb

## 2018-12-09 DIAGNOSIS — G620 Drug-induced polyneuropathy: Secondary | ICD-10-CM

## 2018-12-09 DIAGNOSIS — C9001 Multiple myeloma in remission: Secondary | ICD-10-CM

## 2018-12-09 DIAGNOSIS — M47816 Spondylosis without myelopathy or radiculopathy, lumbar region: Secondary | ICD-10-CM | POA: Diagnosis not present

## 2018-12-09 DIAGNOSIS — M545 Low back pain, unspecified: Secondary | ICD-10-CM

## 2018-12-09 DIAGNOSIS — Z87891 Personal history of nicotine dependence: Secondary | ICD-10-CM

## 2018-12-09 DIAGNOSIS — Z791 Long term (current) use of non-steroidal anti-inflammatories (NSAID): Secondary | ICD-10-CM | POA: Diagnosis not present

## 2018-12-09 DIAGNOSIS — M533 Sacrococcygeal disorders, not elsewhere classified: Secondary | ICD-10-CM | POA: Diagnosis not present

## 2018-12-09 DIAGNOSIS — Z95828 Presence of other vascular implants and grafts: Secondary | ICD-10-CM

## 2018-12-09 LAB — CMP (CANCER CENTER ONLY)
ALT: 15 U/L (ref 0–44)
AST: 18 U/L (ref 15–41)
Albumin: 3.7 g/dL (ref 3.5–5.0)
Alkaline Phosphatase: 63 U/L (ref 38–126)
Anion gap: 9 (ref 5–15)
BUN: 7 mg/dL (ref 6–20)
CO2: 23 mmol/L (ref 22–32)
Calcium: 9.4 mg/dL (ref 8.9–10.3)
Chloride: 109 mmol/L (ref 98–111)
Creatinine: 1.26 mg/dL — ABNORMAL HIGH (ref 0.44–1.00)
GFR, Est AFR Am: 54 mL/min — ABNORMAL LOW (ref >60)
GFR, Estimated: 46 mL/min — ABNORMAL LOW (ref >60)
Glucose, Bld: 110 mg/dL — ABNORMAL HIGH (ref 70–99)
Potassium: 4.2 mmol/L (ref 3.5–5.1)
Sodium: 141 mmol/L (ref 135–145)
Total Bilirubin: 0.5 mg/dL (ref 0.3–1.2)
Total Protein: 7.7 g/dL (ref 6.5–8.1)

## 2018-12-09 LAB — CBC WITH DIFFERENTIAL/PLATELET
Abs Immature Granulocytes: 0.02 10*3/uL (ref 0.00–0.07)
Basophils Absolute: 0.1 10*3/uL (ref 0.0–0.1)
Basophils Relative: 2 %
Eosinophils Absolute: 0.3 10*3/uL (ref 0.0–0.5)
Eosinophils Relative: 7 %
HCT: 37.4 % (ref 36.0–46.0)
Hemoglobin: 12.2 g/dL (ref 12.0–15.0)
Immature Granulocytes: 0 %
Lymphocytes Relative: 40 %
Lymphs Abs: 1.9 10*3/uL (ref 0.7–4.0)
MCH: 31.9 pg (ref 26.0–34.0)
MCHC: 32.6 g/dL (ref 30.0–36.0)
MCV: 97.7 fL (ref 80.0–100.0)
Monocytes Absolute: 0.5 10*3/uL (ref 0.1–1.0)
Monocytes Relative: 11 %
Neutro Abs: 1.9 10*3/uL (ref 1.7–7.7)
Neutrophils Relative %: 40 %
Platelets: 224 10*3/uL (ref 150–400)
RBC: 3.83 MIL/uL — ABNORMAL LOW (ref 3.87–5.11)
RDW: 15.5 % (ref 11.5–15.5)
WBC: 4.7 10*3/uL (ref 4.0–10.5)
nRBC: 0 % (ref 0.0–0.2)

## 2018-12-09 MED ORDER — SODIUM CHLORIDE 0.9 % IV SOLN
Freq: Once | INTRAVENOUS | Status: AC
  Administered 2018-12-09: 11:00:00 via INTRAVENOUS
  Filled 2018-12-09: qty 250

## 2018-12-09 MED ORDER — SODIUM CHLORIDE 0.9% FLUSH
10.0000 mL | Freq: Once | INTRAVENOUS | Status: AC
  Administered 2018-12-09: 10 mL
  Filled 2018-12-09: qty 10

## 2018-12-09 MED ORDER — HEPARIN SOD (PORK) LOCK FLUSH 100 UNIT/ML IV SOLN
250.0000 [IU] | Freq: Once | INTRAVENOUS | Status: AC | PRN
  Administered 2018-12-09: 250 [IU]
  Filled 2018-12-09: qty 5

## 2018-12-09 MED ORDER — SODIUM CHLORIDE 0.9% FLUSH
3.0000 mL | Freq: Once | INTRAVENOUS | Status: AC | PRN
  Administered 2018-12-09: 12:00:00 3 mL
  Filled 2018-12-09: qty 10

## 2018-12-09 MED ORDER — ZOLEDRONIC ACID 4 MG/100ML IV SOLN
4.0000 mg | Freq: Once | INTRAVENOUS | Status: AC
  Administered 2018-12-09: 4 mg via INTRAVENOUS
  Filled 2018-12-09: qty 100

## 2018-12-09 NOTE — Telephone Encounter (Signed)
Scheduled appt per 6/24 los. °

## 2018-12-09 NOTE — Patient Instructions (Signed)

## 2018-12-09 NOTE — Patient Instructions (Signed)

## 2018-12-10 LAB — MULTIPLE MYELOMA PANEL, SERUM
Albumin SerPl Elph-Mcnc: 3.5 g/dL (ref 2.9–4.4)
Albumin/Glob SerPl: 1.1 (ref 0.7–1.7)
Alpha 1: 0.2 g/dL (ref 0.0–0.4)
Alpha2 Glob SerPl Elph-Mcnc: 0.9 g/dL (ref 0.4–1.0)
B-Globulin SerPl Elph-Mcnc: 1 g/dL (ref 0.7–1.3)
Gamma Glob SerPl Elph-Mcnc: 1.2 g/dL (ref 0.4–1.8)
Globulin, Total: 3.3 g/dL (ref 2.2–3.9)
IgA: 223 mg/dL (ref 87–352)
IgG (Immunoglobin G), Serum: 1306 mg/dL (ref 586–1602)
IgM (Immunoglobulin M), Srm: 45 mg/dL (ref 26–217)
Total Protein ELP: 6.8 g/dL (ref 6.0–8.5)

## 2018-12-10 LAB — KAPPA/LAMBDA LIGHT CHAINS
Kappa free light chain: 39.9 mg/L — ABNORMAL HIGH (ref 3.3–19.4)
Kappa, lambda light chain ratio: 1.33 (ref 0.26–1.65)
Lambda free light chains: 30 mg/L — ABNORMAL HIGH (ref 5.7–26.3)

## 2018-12-14 ENCOUNTER — Other Ambulatory Visit: Payer: Self-pay

## 2018-12-14 ENCOUNTER — Encounter: Payer: Self-pay | Admitting: Neurology

## 2018-12-14 ENCOUNTER — Ambulatory Visit (INDEPENDENT_AMBULATORY_CARE_PROVIDER_SITE_OTHER): Payer: Medicare Other | Admitting: Neurology

## 2018-12-14 VITALS — HR 89 | Ht 69.0 in | Wt 227.0 lb

## 2018-12-14 DIAGNOSIS — G620 Drug-induced polyneuropathy: Secondary | ICD-10-CM | POA: Diagnosis not present

## 2018-12-14 NOTE — Patient Instructions (Signed)
You can try to reduce gabapentin by 300mg  each week, to a goal of 600mg  three times daily.  If your pain gets worse, go back to taking 900mg  three times daily  Return to clinic 6 months

## 2018-12-14 NOTE — Progress Notes (Signed)
Follow-up Visit   Date: 12/14/18    Christine Garrett MRN: [id number] DOB: 01-07-1956   Interim History: Christine Garrett is a 63 y.o. right-handed African American female with multiple myeloma in remission (2018), hypertension, GERD, diabetes mellitus, and alcohol use returning to the clinic for follow-up of painful peripheral neuropathy.  The patient was accompanied to the clinic by self.  History of present illness: In August 2018, she was diagnosed with IgG kappa multiple myeloma after presenting with weight loss, GI upset, and imaging showing left T8 lytic lesion with cord compression. She was treated with chemotherapy from August 2018 - November 2018 with bortezomib, lenalidomide, and dexamethasone.  She underwent autologous stem cell transplantation in January 2019.  Lenalidomide was discontinued when right DVT was diagnosed.  Starting in late 2018, she began having achy pain involving the legs which transitioned into more of a stabbing pain and numbness which involves her feet and worse on the right.  She also has some tingling involving her lower legs and rarely in the fingertips.  She falls frequently due to imbalance and uses a cane for long distances.  Upon further questioning, she admits to having numbness and tingling of the feet even prior to the diagnosis of multiple myeloma, but states that symptoms have significantly worsened over the past year.  She takes gabapentin 631m three times daily and cymbalta 658mdaily, which helps the tingling of her lower legs, unfortunately there is no improvement with stabbing pain in the feet.   She was drinking 6 back of beer daily for two year and cut back in June 2018 when diagnosed with multiple myeloma.  She currently drinks 2 beers nightly.  She has diabetes for many years which was poorly controlled.  Hemoglobin A1c from 2018 is 13, however she tells me it is much better controlled.  UPDATE 12/14/2018: She is here for 6-53-monthollow-up visit.  She is doing well clinically and reports that she is in remission with multiple myeloma.  As sharp pain of her feet has significantly improved and she predominantly notices constant numbness.  She continues to have imbalance and walks with a cane.  She has not had any falls.  She is on gabapentin 900 mg 3 times daily.  She does have swelling of the feet which has been ongoing.  Medications:  Current Outpatient Medications on File Prior to Visit  Medication Sig Dispense Refill  . atorvastatin (LIPITOR) 40 MG tablet Take 40 mg by mouth daily.    . EMarland KitchenIQUIS 5 MG TABS tablet TAKE 1 TABLET BY MOUTH TWICE A DAY 60 tablet 2  . enalapril (VASOTEC) 20 MG tablet Take 20 mg by mouth 2 (two) times daily.     . gMarland Kitchenbapentin (NEURONTIN) 300 MG capsule Take 3 capsules (900 mg total) by mouth 3 (three) times daily. 270 capsule 11  . insulin degludec (TRESIBA FLEXTOUCH) 100 UNIT/ML SOPN FlexTouch Pen Inject 0.5 mLs (50 Units total) into the skin daily at 10 pm. 5 pen 2  . Insulin Pen Needle (B-D ULTRAFINE III SHORT PEN) 31G X 8 MM MISC 1 each by Does not apply route as directed. 100 each 3  . lenalidomide (REVLIMID) 5 MG capsule Take 1 capsule (5 mg total) by mouth daily. 3 weeks on 1 week off 21 capsule 0  . metFORMIN (GLUCOPHAGE) 1000 MG tablet Take 1,000 mg by mouth 2 (two) times daily.     . tMarland KitchenZANidine (ZANAFLEX) 4 MG tablet Take 1 tablet (4 mg total) by  mouth every 6 (six) hours as needed for muscle spasms. 60 tablet 0  . Vitamin D, Ergocalciferol, (DRISDOL) 50000 units CAPS capsule Take by mouth.     No current facility-administered medications on file prior to visit.     Allergies:  Allergies  Allergen Reactions  . Hydrocodone Other (See Comments)    MENTAL STATUS CHANGE  MENTAL STATUS CHANGE   (Pt states she is not allergic to Hydrocodone, she was recently taking it) MENTAL STATUS CHANGE    . No Known Allergies     Review of Systems:  CONSTITUTIONAL: No fevers, chills, night  sweats, or weight loss.  EYES: No visual changes or eye pain ENT: No hearing changes.  No history of nose bleeds.   RESPIRATORY: No cough, wheezing and shortness of breath.   CARDIOVASCULAR: Negative for chest pain, and palpitations.   GI: Negative for abdominal discomfort, blood in stools or black stools.  No recent change in bowel habits.   GU:  No history of incontinence.   MUSCLOSKELETAL: No history of joint pain or swelling.  No myalgias.   SKIN: Negative for lesions, rash, and itching.   ENDOCRINE: Negative for cold or heat intolerance, polydipsia or goiter.   PSYCH:  No depression or anxiety symptoms.   NEURO: As Above.   Vital Signs:  Pulse 89   Ht '5\' 9"'  (1.753 m)   Wt 227 lb (103 kg)   SpO2 98%   BMI 33.52 kg/m   General Medical Exam:   General:  Well appearing, comfortable  Eyes/ENT: see cranial nerve examination.   Neck: No masses appreciated.  Full range of motion without tenderness.  No carotid bruits. Respiratory:  Clear to auscultation, good air entry bilaterally.   Cardiac:  Regular rate and rhythm, no murmur.   Ext:   No erythema + 1 putting lower leg and ankle swelling.   Neurological Exam: MENTAL STATUS including orientation to time, place, person, recent and remote memory, attention span and concentration, language, and fund of knowledge is normal.  Speech is not dysarthric.  CRANIAL NERVES:  Face is symmetric.   MOTOR:  Motor strength is 5/5 in all extremities  No atrophy, fasciculations or abnormal movements.  No pronator drift.  Tone is normal.    MSRs:  Reflexes are 3+/4 throughout, except absent Achilles.  SENSORY:  Vibration is absent at the right ankle and great toe, reduced to 50% at the right knee and distal to the left ankle. Temperature and pinprick is also reduced in a gradient fashion below the knee on the right and mid cough on the left. Romberg sign is positive.  COORDINATION/GAIT: Gait appears slow, small steps, and assisted with cane   Data: MRI thoracic spine wwo contrast 12/17/2016:  Large destructive mass lesion centered in the left pedicle of T9 extends into the posterior aspect of the vertebral body, the left transverse process, facet and lamina and is consistent with metastatic disease or possibly myeloma. The lesion deflects the cord to the right and severely flattens it from the inferior aspect of T8 to the superior endplate of O03. Please note the discrepancy in the level of the lesion versus CT abdomen and pelvis is due to transitional lumbosacral anatomy.  Marked multilevel degenerative disc disease as described above.  NCS/EMG of the right arm and leg 02/26/2018:  The electrophysiologic findings are most consistent with a chronic, length-dependent sensorimotor axonal and demyelinating polyneuropathy affecting the right side. Overall, these findings are moderate to severe in degree electrically.  Incidentally, there is a right Martin-Gruber anastomosis, a normal variant.  Labs v8/27/2019:  vitamin B12 233, folate 3.4, copper 88, HbA1c 4.9, TSH 0.592   IMPRESSION/PLAN: Length dependent peripheral neuropathy involving the hands and lower legs, manifesting with numbness and tingling.  Neuropathy has been contributed by diabetes, history of alcohol use, and chemotherapy.  Overall, she reports mild improvement in symptoms and does not have severe pain, numbness is unchanged.  Because of her associated edema in the legs, which can be a side effect of the gabapentin, I have asked her to reduce gabapentin by 300 mg each week to a goal of 600 mg 3 times daily.  If her pain gets worse, she was instructed to go back to the dose that controls symptoms best (986m TID).  Return to clinic in 6 months  Greater than 50% of this 15 minute visit was spent in counseling, explanation of diagnosis, planning of further management, and coordination of care.    Thank you for allowing me to participate in patient's care.  If I can answer any  additional questions, I would be pleased to do so.    Sincerely,     K. PPosey Pronto DO

## 2018-12-16 ENCOUNTER — Ambulatory Visit: Payer: Medicare Other | Admitting: Neurology

## 2018-12-16 ENCOUNTER — Telehealth: Payer: Self-pay | Admitting: *Deleted

## 2018-12-16 NOTE — Telephone Encounter (Signed)
Attempted to contact patient regarding test results per Dr. Grier Mitts directions below. (Phone number [phone] did not allow message to be left.) Please let patient know her labs show she continues to be in remission from her myeloma. Her back Xray did not show overt concerns with myeloma or fractures.  Will contact again later today.

## 2018-12-17 NOTE — Telephone Encounter (Signed)
Reached patient daughter Fernanda Drum (same number). Information given to her (on DPR) as patient not home right now. She verbalized understanding.

## 2018-12-25 ENCOUNTER — Other Ambulatory Visit: Payer: Self-pay | Admitting: *Deleted

## 2018-12-25 MED ORDER — LENALIDOMIDE 5 MG PO CAPS: 5.0000 mg | ORAL_CAPSULE | Freq: Every day | ORAL | 0 refills | Status: DC

## 2018-12-25 NOTE — Telephone Encounter (Signed)
Received faxed refill request from [facility] for Revlimid 5 mg. Refilled per Dr. Irene Limbo OV note 12/09/2018.  [facility] Auth# [id number], 12/25/2018

## 2018-12-28 DIAGNOSIS — Z23 Encounter for immunization: Secondary | ICD-10-CM | POA: Diagnosis not present

## 2018-12-28 DIAGNOSIS — G629 Polyneuropathy, unspecified: Secondary | ICD-10-CM | POA: Diagnosis not present

## 2018-12-28 DIAGNOSIS — C9001 Multiple myeloma in remission: Secondary | ICD-10-CM | POA: Diagnosis not present

## 2018-12-28 DIAGNOSIS — C9000 Multiple myeloma not having achieved remission: Secondary | ICD-10-CM | POA: Diagnosis not present

## 2018-12-28 DIAGNOSIS — Z7901 Long term (current) use of anticoagulants: Secondary | ICD-10-CM | POA: Diagnosis not present

## 2018-12-28 DIAGNOSIS — G6289 Other specified polyneuropathies: Secondary | ICD-10-CM | POA: Diagnosis not present

## 2018-12-28 DIAGNOSIS — Z87891 Personal history of nicotine dependence: Secondary | ICD-10-CM | POA: Diagnosis not present

## 2018-12-28 DIAGNOSIS — I82501 Chronic embolism and thrombosis of unspecified deep veins of right lower extremity: Secondary | ICD-10-CM | POA: Diagnosis not present

## 2018-12-28 DIAGNOSIS — Z9484 Stem cells transplant status: Secondary | ICD-10-CM | POA: Diagnosis not present

## 2018-12-28 DIAGNOSIS — M545 Low back pain: Secondary | ICD-10-CM | POA: Diagnosis not present

## 2018-12-31 DIAGNOSIS — E1121 Type 2 diabetes mellitus with diabetic nephropathy: Secondary | ICD-10-CM | POA: Diagnosis not present

## 2018-12-31 DIAGNOSIS — J449 Chronic obstructive pulmonary disease, unspecified: Secondary | ICD-10-CM | POA: Diagnosis not present

## 2018-12-31 DIAGNOSIS — I10 Essential (primary) hypertension: Secondary | ICD-10-CM | POA: Diagnosis not present

## 2018-12-31 DIAGNOSIS — I5042 Chronic combined systolic (congestive) and diastolic (congestive) heart failure: Secondary | ICD-10-CM | POA: Diagnosis not present

## 2018-12-31 DIAGNOSIS — E7849 Other hyperlipidemia: Secondary | ICD-10-CM | POA: Diagnosis not present

## 2018-12-31 DIAGNOSIS — N182 Chronic kidney disease, stage 2 (mild): Secondary | ICD-10-CM | POA: Diagnosis not present

## 2019-01-20 ENCOUNTER — Other Ambulatory Visit: Payer: Self-pay | Admitting: *Deleted

## 2019-01-20 MED ORDER — LENALIDOMIDE 5 MG PO CAPS: 5.0000 mg | ORAL_CAPSULE | Freq: Every day | ORAL | 0 refills | Status: DC

## 2019-01-20 NOTE — Telephone Encounter (Signed)
Revlimid refilled per Dr. Irene Limbo OV note 12/09/2018 Refill sent to [facility], [place] [address]Massie Maroon [facility] auth# [id number], 01/20/2019

## 2019-01-22 ENCOUNTER — Telehealth: Payer: Self-pay | Admitting: Pharmacist

## 2019-01-22 NOTE — Telephone Encounter (Signed)
A RN from [facility] called.  They are having trouble reaching pt. I provided pts email and her son, Marvin's contact info.  Kennith Center, Pharm.D., CPP 01/22/2019@9 :39 AM

## 2019-01-31 DIAGNOSIS — I110 Hypertensive heart disease with heart failure: Secondary | ICD-10-CM | POA: Diagnosis not present

## 2019-01-31 DIAGNOSIS — Z794 Long term (current) use of insulin: Secondary | ICD-10-CM | POA: Diagnosis not present

## 2019-01-31 DIAGNOSIS — Z79899 Other long term (current) drug therapy: Secondary | ICD-10-CM | POA: Diagnosis not present

## 2019-01-31 DIAGNOSIS — R079 Chest pain, unspecified: Secondary | ICD-10-CM | POA: Diagnosis not present

## 2019-01-31 DIAGNOSIS — E119 Type 2 diabetes mellitus without complications: Secondary | ICD-10-CM | POA: Diagnosis not present

## 2019-01-31 DIAGNOSIS — Z9484 Stem cells transplant status: Secondary | ICD-10-CM | POA: Diagnosis not present

## 2019-01-31 DIAGNOSIS — R0602 Shortness of breath: Secondary | ICD-10-CM | POA: Diagnosis not present

## 2019-01-31 DIAGNOSIS — Z9851 Tubal ligation status: Secondary | ICD-10-CM | POA: Diagnosis not present

## 2019-01-31 DIAGNOSIS — Z7901 Long term (current) use of anticoagulants: Secondary | ICD-10-CM | POA: Diagnosis not present

## 2019-01-31 DIAGNOSIS — Z8579 Personal history of other malignant neoplasms of lymphoid, hematopoietic and related tissues: Secondary | ICD-10-CM | POA: Diagnosis not present

## 2019-01-31 DIAGNOSIS — I509 Heart failure, unspecified: Secondary | ICD-10-CM | POA: Diagnosis not present

## 2019-01-31 DIAGNOSIS — R0789 Other chest pain: Secondary | ICD-10-CM | POA: Diagnosis not present

## 2019-01-31 DIAGNOSIS — J189 Pneumonia, unspecified organism: Secondary | ICD-10-CM | POA: Diagnosis not present

## 2019-02-01 DIAGNOSIS — Z20828 Contact with and (suspected) exposure to other viral communicable diseases: Secondary | ICD-10-CM | POA: Diagnosis not present

## 2019-02-08 ENCOUNTER — Inpatient Hospital Stay: Payer: Medicare Other | Admitting: Hematology

## 2019-02-08 ENCOUNTER — Inpatient Hospital Stay: Payer: Medicare Other

## 2019-02-10 ENCOUNTER — Other Ambulatory Visit: Payer: Self-pay | Admitting: *Deleted

## 2019-02-10 MED ORDER — LENALIDOMIDE 5 MG PO CAPS: 5.0000 mg | ORAL_CAPSULE | Freq: Every day | ORAL | 0 refills | Status: DC

## 2019-02-10 NOTE — Telephone Encounter (Signed)
Refill Revlimid per Dr. Irene Limbo OV note 12/09/2018 Refill sent to  [place], [place] [address] [facility] Auth# [id number], 02/10/2019

## 2019-02-11 ENCOUNTER — Other Ambulatory Visit: Payer: Self-pay | Admitting: *Deleted

## 2019-02-11 NOTE — Telephone Encounter (Signed)
Opened in error

## 2019-02-18 NOTE — Progress Notes (Signed)
HEMATOLOGY/ONCOLOGY CLINIC NOTE  Date of Service: 02/18/2019  Patient Care Team: Neale Burly, MD as PCP - General (Internal Medicine)  CHIEF COMPLAINTS/PURPOSE OF CONSULTATION:  F/u for continued mx of Multiple myeloma  Oncologic History:  63 y.o. female who initially presented with significant weight loss in the context of H. pylori-associated gastritis and alcohol abuse. At the same time, an incidental discovery of a lower thoracic vertebral mass in the context of chronic numbness and weakness in the lower extremities. MRI of the thoracic spine demonstrated a destructive lesion at T8 level and patient underwent surgical treatment. 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 Staging evaluation conducted by our clinic confirmed presence of IgG kappa multiple myeloma. Cytogenetics studies demonstrated normal karyotype, FISH studies are positive for 13q34 & D13S319 loss and no loss of p53. Based on initial information, patient was staged at R-ISS II.   Patient has completed 6 cycles of induction systemic chemotherapy with lenalidomide, bortezomib, and low-dose dexamethasone.  Repeat bone marrow biopsy demonstrated excellent response to treatment and patient subsequently underwent consolidation therapy including melphalan conditioning and autologous stem cell rescue.  Repeat assessment with PET/CT and bone marrow biopsy on 10/13/2017 demonstrates stringent complete response maintenance.  HISTORY OF PRESENTING ILLNESS:   Christine Garrett is a wonderful 63 y.o. female who has been referred to [place] by my colleague Dr Grace Isaac for evaluation and management of Multiple myeloma. The pt reports that she is doing well overall.   The pt has previously been followed by my colleague Dr Grace Isaac and completed 6 cycles of induction with Revlimid, Velcade and dexamethasone. She had a autologous PBSCT on 07/01/17. She is on lenalidomide maintenance currently.   The  pt reports that she has had a headache for the past week, noting a constant pounding in the front of her forehead. She denies fevers and is always cold. She is having clear, nasal discharge and seasonal allergies. She notes aspirin has alleviated her headache some.   She is continuing to take Revlimid on 21 day cycles.  Most recent lab results (11/21/17) of CBC and CMP is as follows: all values are WNL.  MMP 11/21/17 is shows no M spike   On review of systems, pt reports headache, moving her bowels well, increasing strength, and denies mouth sores, fevers, night sweats, and any other symptoms.   INTERVAL HISTORY:  Christine Garrett returns today for management and evaluation of her Multiple Myeloma currently in remission. The patient's last visit with [place] was on 12/09/2018. The pt reports that she is doing well overall.  The pt reports no acute new concerns. Tolerating Revlimid without any new toxicities.  Of note since the patient's last visit, pt has had a lumbar spine xray completed on 12/09/2018 with results revealing "No acute osseous abnormality. Extensive multilevel degenerative changes are noted throughout the lumbar spine."  Pt also had a sacrum and coccyx xray completed on 12/09/2018 with results revealing "No acute osseous abnormality. Degenerative changes are noted of both hips."   Lab results today (02/18/19) of CBC w/diff and CMP reviewed  On review of systems, pt reports no leg swelling and denies fevers/chills. nightsweats and any other symptoms.    MEDICAL HISTORY:  Past Medical History:  Diagnosis Date  . Arthritis    knees  . Cancer ([place])   . Carpal tunnel syndrome   . CHF (congestive heart failure) ([place])   . Diabetes mellitus    type 2  . GERD (gastroesophageal reflux  disease)   . Heart murmur    "Little, No concerns" per Dr  Dannielle Burn pt reported.  . Hypertension    controlled using a guided approch with plasma renin activity  . Multiple myeloma not having achieved  remission ([place]) 01/06/2017  . Neuropathy   . Peripheral vascular disease ([place])   . Sleep apnea    Uses CPAP    SURGICAL HISTORY: Past Surgical History:  Procedure Laterality Date  . BIOPSY  11/28/2016   Procedure: BIOPSY;  Surgeon: Rogene Houston, MD;  [facility];  Service: Endoscopy;;  gastric  . [place]  . CERVICAL [place]E BIOPSY     cervical lesion  . ESOPHAGOGASTRODUODENOSCOPY (EGD) WITH PROPOFOL N/A 11/28/2016   Procedure: ESOPHAGOGASTRODUODENOSCOPY (EGD) WITH PROPOFOL;  Surgeon: Rogene Houston, MD;  [facility];  Service: Endoscopy;  Laterality: N/A;  9:25  . IR FLUORO GUIDE PORT INSERTION RIGHT  01/29/2017  . IR US GUIDE VASC ACCESS RIGHT  01/29/2017  . lipoma removal     right shoulder 2001  . MULTIPLE EXTRACTIONS WITH ALVEOLOPLASTY Bilateral 08/24/2012   Procedure: MULTIPLE EXTRACTION WITH ALVEOLOPLASTY BIOPSY OF RIGHT AND LEFT MANDIBLE ;  Surgeon: Gae Bon, DDS;  Location: [facility];  Service: Oral Surgery;  Laterality: Bilateral;  . POSTERIOR LUMBAR FUSION 4 LEVEL N/A 12/26/2016   Procedure: THORACIC EIGHT TUMOR RESECTION, THORACIC SIX- THORACIC TEN POSTERIOR SPINAL FUSION;  Surgeon: Ashok Pall, MD;  Location: [facility];  Service: Neurosurgery;  Laterality: N/A;  THORACIC 8 TUMOR RESECTION, THORACIC 6- THORACIC 10 POSTERIOR SPINAL FUSION  . ROTATOR CUFF REPAIR     right shoulder  . TUBAL LIGATION      SOCIAL HISTORY: Social History   Socioeconomic History  . Marital status: Single    Spouse name: Not on file  . Number of children: 3  . Years of education: Not on file  . Highest education level: Not on file  Occupational History  . Occupation: on disability  . Occupation: former Proofreader  . Financial resource strain: Not on file  . Food insecurity    Worry: Not on file    Inability: Not on file  . Transportation needs    Medical: Not on file    Non-medical: Not on file  Tobacco Use  . Smoking status: Former Smoker     Packs/day: 0.50    Years: 6.00    Pack years: 3.00    Types: Cigarettes    Quit date: 11/23/2011    Years since quitting: 7.2  . Smokeless tobacco: Never Used  . Tobacco comment: quit summer 2013  Substance and Sexual Activity  . Alcohol use: No    Alcohol/week: 6.0 standard drinks    Types: 6 Cans of beer per week  . Drug use: No  . Sexual activity: Not Currently    Birth control/protection: Post-menopausal  Lifestyle  . Physical activity    Days per week: Not on file    Minutes per session: Not on file  . Stress: Not on file  Relationships  . Social Herbalist on phone: Not on file    Gets together: Not on file    Attends religious service: Not on file    Active member of club or organization: Not on file    Attends meetings of clubs or organizations: Not on file    Relationship status: Not on file  . Intimate partner violence  Fear of current or ex partner: Not on file    Emotionally abused: Not on file    Physically abused: Not on file    Forced sexual activity: Not on file  Other Topics Concern  . Not on file  Social History Narrative   Lives with son, daughter and grandkids in a 2 story home but stays on the first floor.  Has 3 children.  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      Right handed    FAMILY HISTORY: Family History  Problem Relation Age of Onset  . Diabetes Mother   . Hypertension Mother   . Heart disease Mother   . Alzheimer's disease Mother   . Diabetes Father   . Heart disease Sister   . Diabetes Sister     ALLERGIES:  is allergic to hydrocodone and no known allergies.  MEDICATIONS:  Current Outpatient Medications  Medication Sig Dispense Refill  . atorvastatin (LIPITOR) 40 MG tablet Take 40 mg by mouth daily.    Marland Kitchen ELIQUIS 5 MG TABS tablet TAKE 1 TABLET BY MOUTH TWICE A DAY 60 tablet 2  . enalapril (VASOTEC) 20 MG tablet Take 20 mg by mouth 2 (two) times daily.     Marland Kitchen gabapentin (NEURONTIN) 300 MG  capsule Take 3 capsules (900 mg total) by mouth 3 (three) times daily. 270 capsule 11  . insulin degludec (TRESIBA FLEXTOUCH) 100 UNIT/ML SOPN FlexTouch Pen Inject 0.5 mLs (50 Units total) into the skin daily at 10 pm. 5 pen 2  . Insulin Pen Needle (B-D ULTRAFINE III SHORT PEN) 31G X 8 MM MISC 1 each by Does not apply route as directed. 100 each 3  . lenalidomide (REVLIMID) 5 MG capsule Take 1 capsule (5 mg total) by mouth daily. 3 weeks on 1 week off 21 capsule 0  . metFORMIN (GLUCOPHAGE) 1000 MG tablet Take 1,000 mg by mouth 2 (two) times daily.     Marland Kitchen tiZANidine (ZANAFLEX) 4 MG tablet Take 1 tablet (4 mg total) by mouth every 6 (six) hours as needed for muscle spasms. 60 tablet 0  . Vitamin D, Ergocalciferol, (DRISDOL) 50000 units CAPS capsule Take by mouth.     No current facility-administered medications for this visit.     REVIEW OF SYSTEMS:   A 10+ POINT REVIEW OF SYSTEMS WAS OBTAINED including neurology, dermatology, psychiatry, cardiac, respiratory, lymph, extremities, GI, GU, Musculoskeletal, constitutional, breasts, reproductive, HEENT.  All pertinent positives are noted in the HPI.  All others are negative.    PHYSICAL EXAMINATION: ECOG PERFORMANCE STATUS: 1 - Symptomatic but completely ambulatory   Vitals:   02/19/19 1135  BP: (!) 178/70  Pulse: 89  Resp: 18  Temp: 98.7 F (37.1 C)  SpO2: 100%   Filed Weights   02/19/19 1135  Weight: 222 lb 6.4 oz (100.9 kg)   Body mass index is 32.84 kg/m.  GENERAL:alert, in no acute distress and comfortable SKIN: no acute rashes, no significant lesions EYES: conjunctiva are pink and non-injected, sclera anicteric OROPHARYNX: MMM, no exudates, no oropharyngeal erythema or ulceration NECK: supple, no JVD LYMPH:  no palpable lymphadenopathy in the cervical, axillary or inguinal regions LUNGS: clear to auscultation b/l with normal respiratory effort HEART: regular rate & rhythm ABDOMEN:  normoactive bowel sounds , non tender, not  distended. Extremity: no pedal edema PSYCH: alert & oriented x 3 with fluent speech NEURO: no focal motor/sensory deficits    LABORATORY DATA:  I have  reviewed the data as listed  . CBC Latest Ref Rng & Units 02/19/2019 12/09/2018 10/09/2018  WBC 4.0 - 10.5 K/uL 5.3 4.7 4.9  Hemoglobin 12.0 - 15.0 g/dL 12.2 12.2 11.7(L)  Hematocrit 36.0 - 46.0 % 36.9 37.4 35.7(L)  Platelets 150 - 400 K/uL 318 224 198    . CMP Latest Ref Rng & Units 02/19/2019 12/09/2018 10/09/2018  Glucose 70 - 99 mg/dL 84 110(H) 114(H)  BUN 6 - 20 mg/dL _0 Creatinine 0.44 - 1.00 mg/dL 1.19(H) 1.26(H) 1.13(H)  Sodium 135 - 145 mmol/L 143 141 143  Potassium 3.5 - 5.1 mmol/L 4.0 4.2 4.6  Chloride 98 - 111 mmol/L 110 109 111  CO2 22 - 32 mmol/L 24 23 21(L)  Calcium 8.9 - 10.3 mg/dL 9.3 9.4 9.4  Total Protein 6.5 - 8.1 g/dL 7.4 7.7 7.3  Total Bilirubin 0.3 - 1.2 mg/dL 0.3 0.5 0.2(L)  Alkaline Phos 38 - 126 U/L 61 63 66  AST 15 - 41 U/L 16 18 13(L)  ALT 0 - 44 U/L _1 Component     Latest Ref Rng & Units 02/19/2019  IgG (Immunoglobin G), Serum     586 - 1,602 mg/dL 1,246  IgA     87 - 352 mg/dL 267  IgM (Immunoglobulin M), Srm     26 - 217 mg/dL 45  Total Protein ELP     6.0 - 8.5 g/dL 6.9  Albumin SerPl Elph-Mcnc     2.9 - 4.4 g/dL 3.6  Alpha 1     0.0 - 0.4 g/dL 0.2  Alpha2 Glob SerPl Elph-Mcnc     0.4 - 1.0 g/dL 0.8  B-Globulin SerPl Elph-Mcnc     0.7 - 1.3 g/dL 1.1  Gamma Glob SerPl Elph-Mcnc     0.4 - 1.8 g/dL 1.3  M Protein SerPl Elph-Mcnc     Not Observed g/dL Not Observed  Globulin, Total     2.2 - 3.9 g/dL 3.3  Albumin/Glob SerPl     0.7 - 1.7 1.1  IFE 1      Comment  Please Note (HCV):      Comment   03/25/18 SPEP and SFLC:     04/21/17 Cytogenetics:    01/17/17 Cytogenetics:    RADIOGRAPHIC STUDIES: I have personally reviewed the radiological images as listed and agreed with the findings in the report. No results found.  10/13/17 PET/CT     ASSESSMENT & PLAN:   63 y.o. female with  1. Multiple myeloma Currently in remission Autologous PBSCT on 07/01/17. IgG kappa multiple myeloma. Cytogenetics studies demonstrated normal karyotype, FISH studies are positive for 13q34 & D13S319 loss and no loss of p53. Based on initial information, patient was staged at R-ISS II.  Completed 6 cycles of induction with Revlimid, Velcade and dexamethasone  2. Grade 2 neuropathy -controlled  02/26/18 NCV with EMG which revealed The electrophysiologic findings are most consistent with a chronic, length-dependent sensorimotor axonal and demyelinating polyneuropathy affecting the right side. Overall, these findings are moderate to severe in degree electrically. Incidentally, there is a right Martin-Gruber anastomosis, a normal variant.   3. Rt LE calf veins - possible clot ?chronic.  01/29/18 VAS [place] BLE which revealed Right: Ultrasound is unable to distinguish whether obstruction in the Peroneal veins, and great saphenous vein, and superficial veins/varciosities is acute or chronic. No cystic structure found in the popliteal fossa. Left: no evidence of DVT.    PLAN: -Discussed pt labwork today, 02/18/19; -  12/09/2018 lumbar spine xray with results revealing "No acute osseous abnormality. Extensive multilevel degenerative changes are noted throughout the lumbar spine.". - 12/09/2018 sacrum and coccyx xray with results revealing "No acute osseous abnormality. Degenerative changes are noted of both hips." -SPEP with no M spike suggesting continued CR -no prohibitive toxicities from Revlimid --continue current dose of maintenance Revlimid -continue Zometa q12 weeks. No dental issues  FOLLOW UP: F/u for zometa as schedule on 9/24 and q12 weeks x 4 May cancel lab and MD visit on 9/24 RTC with dr Irene Limbo with labs in 2 months   The total time spent in the appt was 20 minutes and more than 50% was on counseling and direct patient cares.  All of the patient's questions were  answered with apparent satisfaction. The patient knows to call the clinic with any problems, questions or concerns.     Sullivan Lone MD MS AAHIVMS [facility] Hematology/Oncology Physician [facility]  (Office):       [phone] (Work cell):  [phone] (Fax):           [phone]  02/18/2019 11:56 PM  I, Jacqualyn Posey, am acting as a Education administrator for Dr. Sullivan Lone.   .I have reviewed the above documentation for accuracy and completeness, and I agree with the above. Brunetta Genera MD

## 2019-02-19 ENCOUNTER — Other Ambulatory Visit: Payer: Self-pay

## 2019-02-19 ENCOUNTER — Inpatient Hospital Stay (HOSPITAL_BASED_OUTPATIENT_CLINIC_OR_DEPARTMENT_OTHER): Payer: Medicare Other | Admitting: Hematology

## 2019-02-19 ENCOUNTER — Inpatient Hospital Stay: Payer: Medicare Other | Attending: Hematology

## 2019-02-19 VITALS — BP 178/70 | HR 89 | Temp 98.7°F | Resp 18 | Ht 69.0 in | Wt 222.4 lb

## 2019-02-19 DIAGNOSIS — C9001 Multiple myeloma in remission: Secondary | ICD-10-CM | POA: Insufficient documentation

## 2019-02-19 DIAGNOSIS — Z23 Encounter for immunization: Secondary | ICD-10-CM | POA: Diagnosis not present

## 2019-02-19 LAB — CBC WITH DIFFERENTIAL/PLATELET
Abs Immature Granulocytes: 0.01 10*3/uL (ref 0.00–0.07)
Basophils Absolute: 0.1 10*3/uL (ref 0.0–0.1)
Basophils Relative: 2 %
Eosinophils Absolute: 0.2 10*3/uL (ref 0.0–0.5)
Eosinophils Relative: 4 %
HCT: 36.9 % (ref 36.0–46.0)
Hemoglobin: 12.2 g/dL (ref 12.0–15.0)
Immature Granulocytes: 0 %
Lymphocytes Relative: 43 %
Lymphs Abs: 2.2 10*3/uL (ref 0.7–4.0)
MCH: 32.1 pg (ref 26.0–34.0)
MCHC: 33.1 g/dL (ref 30.0–36.0)
MCV: 97.1 fL (ref 80.0–100.0)
Monocytes Absolute: 0.5 10*3/uL (ref 0.1–1.0)
Monocytes Relative: 9 %
Neutro Abs: 2.3 10*3/uL (ref 1.7–7.7)
Neutrophils Relative %: 42 %
Platelets: 318 10*3/uL (ref 150–400)
RBC: 3.8 MIL/uL — ABNORMAL LOW (ref 3.87–5.11)
RDW: 14.7 % (ref 11.5–15.5)
WBC: 5.3 10*3/uL (ref 4.0–10.5)
nRBC: 0 % (ref 0.0–0.2)

## 2019-02-19 LAB — CMP (CANCER CENTER ONLY)
ALT: 14 U/L (ref 0–44)
AST: 16 U/L (ref 15–41)
Albumin: 3.7 g/dL (ref 3.5–5.0)
Alkaline Phosphatase: 61 U/L (ref 38–126)
Anion gap: 9 (ref 5–15)
BUN: 11 mg/dL (ref 6–20)
CO2: 24 mmol/L (ref 22–32)
Calcium: 9.3 mg/dL (ref 8.9–10.3)
Chloride: 110 mmol/L (ref 98–111)
Creatinine: 1.19 mg/dL — ABNORMAL HIGH (ref 0.44–1.00)
GFR, Est AFR Am: 57 mL/min — ABNORMAL LOW (ref >60)
GFR, Estimated: 50 mL/min — ABNORMAL LOW (ref >60)
Glucose, Bld: 84 mg/dL (ref 70–99)
Potassium: 4 mmol/L (ref 3.5–5.1)
Sodium: 143 mmol/L (ref 135–145)
Total Bilirubin: 0.3 mg/dL (ref 0.3–1.2)
Total Protein: 7.4 g/dL (ref 6.5–8.1)

## 2019-02-23 LAB — MULTIPLE MYELOMA PANEL, SERUM
Albumin SerPl Elph-Mcnc: 3.6 g/dL (ref 2.9–4.4)
Albumin/Glob SerPl: 1.1 (ref 0.7–1.7)
Alpha 1: 0.2 g/dL (ref 0.0–0.4)
Alpha2 Glob SerPl Elph-Mcnc: 0.8 g/dL (ref 0.4–1.0)
B-Globulin SerPl Elph-Mcnc: 1.1 g/dL (ref 0.7–1.3)
Gamma Glob SerPl Elph-Mcnc: 1.3 g/dL (ref 0.4–1.8)
Globulin, Total: 3.3 g/dL (ref 2.2–3.9)
IgA: 267 mg/dL (ref 87–352)
IgG (Immunoglobin G), Serum: 1246 mg/dL (ref 586–1602)
IgM (Immunoglobulin M), Srm: 45 mg/dL (ref 26–217)
Total Protein ELP: 6.9 g/dL (ref 6.0–8.5)

## 2019-03-11 ENCOUNTER — Ambulatory Visit: Payer: Medicare Other | Admitting: Hematology

## 2019-03-11 ENCOUNTER — Other Ambulatory Visit: Payer: Medicare Other

## 2019-03-11 ENCOUNTER — Inpatient Hospital Stay: Payer: Medicare Other

## 2019-03-11 ENCOUNTER — Other Ambulatory Visit: Payer: Self-pay

## 2019-03-11 VITALS — BP 176/97 | HR 79 | Temp 98.5°F | Resp 16 | Ht 69.0 in | Wt 228.0 lb

## 2019-03-11 DIAGNOSIS — Z95828 Presence of other vascular implants and grafts: Secondary | ICD-10-CM

## 2019-03-11 DIAGNOSIS — Z23 Encounter for immunization: Secondary | ICD-10-CM

## 2019-03-11 DIAGNOSIS — C9001 Multiple myeloma in remission: Secondary | ICD-10-CM | POA: Diagnosis not present

## 2019-03-11 MED ORDER — HEPARIN SOD (PORK) LOCK FLUSH 100 UNIT/ML IV SOLN
500.0000 [IU] | Freq: Once | INTRAVENOUS | Status: AC
  Administered 2019-03-11: 500 [IU] via INTRAVENOUS
  Filled 2019-03-11: qty 5

## 2019-03-11 MED ORDER — INFLUENZA VAC SPLIT QUAD 0.5 ML IM SUSY
0.5000 mL | PREFILLED_SYRINGE | Freq: Once | INTRAMUSCULAR | Status: AC
  Administered 2019-03-11: 0.5 mL via INTRAMUSCULAR

## 2019-03-11 MED ORDER — SODIUM CHLORIDE 0.9% FLUSH
10.0000 mL | Freq: Once | INTRAVENOUS | Status: AC
  Administered 2019-03-11: 10 mL
  Filled 2019-03-11: qty 10

## 2019-03-11 MED ORDER — INFLUENZA VAC SPLIT QUAD 0.5 ML IM SUSY
PREFILLED_SYRINGE | INTRAMUSCULAR | Status: AC
  Filled 2019-03-11: qty 0.5

## 2019-03-11 MED ORDER — SODIUM CHLORIDE 0.9 % IV SOLN
INTRAVENOUS | Status: DC
  Administered 2019-03-11: 10:00:00 via INTRAVENOUS
  Filled 2019-03-11: qty 250

## 2019-03-11 MED ORDER — ZOLEDRONIC ACID 4 MG/100ML IV SOLN
4.0000 mg | Freq: Once | INTRAVENOUS | Status: AC
  Administered 2019-03-11: 11:00:00 4 mg via INTRAVENOUS
  Filled 2019-03-11: qty 100

## 2019-03-13 ENCOUNTER — Other Ambulatory Visit: Payer: Self-pay | Admitting: Hematology

## 2019-03-17 ENCOUNTER — Other Ambulatory Visit: Payer: Self-pay | Admitting: *Deleted

## 2019-03-17 MED ORDER — LENALIDOMIDE 5 MG PO CAPS: 5.0000 mg | ORAL_CAPSULE | Freq: Every day | ORAL | 0 refills | Status: DC

## 2019-03-17 NOTE — Telephone Encounter (Signed)
Refilled Revlimid per Dr.Kale OV note 02/19/2019 Refill escribed to [place], [place]. [address] [facility] auth# [id number], 03/17/2019

## 2019-03-19 ENCOUNTER — Inpatient Hospital Stay: Payer: Medicare Other | Admitting: Hematology

## 2019-03-19 ENCOUNTER — Inpatient Hospital Stay: Payer: Medicare Other

## 2019-03-19 ENCOUNTER — Telehealth: Payer: Self-pay | Admitting: Hematology

## 2019-03-19 NOTE — Telephone Encounter (Signed)
Scheduled appt per 10/1 sch message - pt aware of appt date and time

## 2019-03-22 ENCOUNTER — Telehealth: Payer: Self-pay

## 2019-03-22 NOTE — Telephone Encounter (Signed)
Oral Oncology Patient Advocate Encounter  [facility] informed me that they were trying to get in touch with the patient to get her Revlimid refill scheduled.   I called the patient and her daughter and had to leave a voicemail.  [place] Patient [place] Phone [phone] Fax [phone] 03/22/2019   10:19 AM

## 2019-04-01 DIAGNOSIS — J449 Chronic obstructive pulmonary disease, unspecified: Secondary | ICD-10-CM | POA: Diagnosis not present

## 2019-04-01 DIAGNOSIS — E7849 Other hyperlipidemia: Secondary | ICD-10-CM | POA: Diagnosis not present

## 2019-04-01 DIAGNOSIS — E1121 Type 2 diabetes mellitus with diabetic nephropathy: Secondary | ICD-10-CM | POA: Diagnosis not present

## 2019-04-01 DIAGNOSIS — I10 Essential (primary) hypertension: Secondary | ICD-10-CM | POA: Diagnosis not present

## 2019-04-01 DIAGNOSIS — N182 Chronic kidney disease, stage 2 (mild): Secondary | ICD-10-CM | POA: Diagnosis not present

## 2019-04-01 DIAGNOSIS — I5042 Chronic combined systolic (congestive) and diastolic (congestive) heart failure: Secondary | ICD-10-CM | POA: Diagnosis not present

## 2019-04-08 DIAGNOSIS — I340 Nonrheumatic mitral (valve) insufficiency: Secondary | ICD-10-CM | POA: Diagnosis not present

## 2019-04-08 DIAGNOSIS — E7800 Pure hypercholesterolemia, unspecified: Secondary | ICD-10-CM | POA: Diagnosis not present

## 2019-04-08 DIAGNOSIS — I5022 Chronic systolic (congestive) heart failure: Secondary | ICD-10-CM | POA: Diagnosis not present

## 2019-04-08 DIAGNOSIS — I2510 Atherosclerotic heart disease of native coronary artery without angina pectoris: Secondary | ICD-10-CM | POA: Diagnosis not present

## 2019-04-08 DIAGNOSIS — I10 Essential (primary) hypertension: Secondary | ICD-10-CM | POA: Diagnosis not present

## 2019-04-08 DIAGNOSIS — Z86711 Personal history of pulmonary embolism: Secondary | ICD-10-CM | POA: Insufficient documentation

## 2019-04-14 ENCOUNTER — Other Ambulatory Visit: Payer: Self-pay | Admitting: Hematology

## 2019-04-14 DIAGNOSIS — I351 Nonrheumatic aortic (valve) insufficiency: Secondary | ICD-10-CM | POA: Diagnosis not present

## 2019-04-14 DIAGNOSIS — I2510 Atherosclerotic heart disease of native coronary artery without angina pectoris: Secondary | ICD-10-CM | POA: Diagnosis not present

## 2019-04-14 DIAGNOSIS — I509 Heart failure, unspecified: Secondary | ICD-10-CM | POA: Diagnosis not present

## 2019-04-14 DIAGNOSIS — I340 Nonrheumatic mitral (valve) insufficiency: Secondary | ICD-10-CM | POA: Diagnosis not present

## 2019-04-14 DIAGNOSIS — I071 Rheumatic tricuspid insufficiency: Secondary | ICD-10-CM | POA: Diagnosis not present

## 2019-04-14 DIAGNOSIS — I110 Hypertensive heart disease with heart failure: Secondary | ICD-10-CM | POA: Diagnosis not present

## 2019-04-14 DIAGNOSIS — I5022 Chronic systolic (congestive) heart failure: Secondary | ICD-10-CM | POA: Diagnosis not present

## 2019-04-16 ENCOUNTER — Other Ambulatory Visit: Payer: Self-pay | Admitting: *Deleted

## 2019-04-16 MED ORDER — LENALIDOMIDE 5 MG PO CAPS: 5.0000 mg | ORAL_CAPSULE | Freq: Every day | ORAL | 0 refills | Status: DC

## 2019-04-16 NOTE — Telephone Encounter (Signed)
Refilled Revlimid per Dr.Kale OV note 02/19/2019 Refill escribed to [place], [place]. [address] [facility] Auth# [id number], 04/16/2019

## 2019-04-21 ENCOUNTER — Inpatient Hospital Stay: Payer: Medicare Other

## 2019-04-21 ENCOUNTER — Inpatient Hospital Stay: Payer: Medicare Other | Attending: Hematology | Admitting: Hematology

## 2019-04-21 ENCOUNTER — Other Ambulatory Visit: Payer: Self-pay

## 2019-04-21 VITALS — BP 152/77 | HR 73 | Temp 97.8°F | Resp 18 | Ht 69.0 in | Wt 225.2 lb

## 2019-04-21 DIAGNOSIS — G629 Polyneuropathy, unspecified: Secondary | ICD-10-CM | POA: Diagnosis not present

## 2019-04-21 DIAGNOSIS — Z7984 Long term (current) use of oral hypoglycemic drugs: Secondary | ICD-10-CM | POA: Insufficient documentation

## 2019-04-21 DIAGNOSIS — Z87891 Personal history of nicotine dependence: Secondary | ICD-10-CM | POA: Diagnosis not present

## 2019-04-21 DIAGNOSIS — R5383 Other fatigue: Secondary | ICD-10-CM | POA: Insufficient documentation

## 2019-04-21 DIAGNOSIS — Z7289 Other problems related to lifestyle: Secondary | ICD-10-CM | POA: Diagnosis not present

## 2019-04-21 DIAGNOSIS — C9001 Multiple myeloma in remission: Secondary | ICD-10-CM

## 2019-04-21 DIAGNOSIS — R197 Diarrhea, unspecified: Secondary | ICD-10-CM | POA: Diagnosis not present

## 2019-04-21 DIAGNOSIS — K219 Gastro-esophageal reflux disease without esophagitis: Secondary | ICD-10-CM | POA: Insufficient documentation

## 2019-04-21 DIAGNOSIS — Z95828 Presence of other vascular implants and grafts: Secondary | ICD-10-CM

## 2019-04-21 LAB — CBC WITH DIFFERENTIAL/PLATELET
Abs Immature Granulocytes: 0.01 10*3/uL (ref 0.00–0.07)
Basophils Absolute: 0.1 10*3/uL (ref 0.0–0.1)
Basophils Relative: 2 %
Eosinophils Absolute: 0.2 10*3/uL (ref 0.0–0.5)
Eosinophils Relative: 3 %
HCT: 36.5 % (ref 36.0–46.0)
Hemoglobin: 12.1 g/dL (ref 12.0–15.0)
Immature Granulocytes: 0 %
Lymphocytes Relative: 43 %
Lymphs Abs: 2.6 10*3/uL (ref 0.7–4.0)
MCH: 32.4 pg (ref 26.0–34.0)
MCHC: 33.2 g/dL (ref 30.0–36.0)
MCV: 97.9 fL (ref 80.0–100.0)
Monocytes Absolute: 0.6 10*3/uL (ref 0.1–1.0)
Monocytes Relative: 10 %
Neutro Abs: 2.5 10*3/uL (ref 1.7–7.7)
Neutrophils Relative %: 42 %
Platelets: 247 10*3/uL (ref 150–400)
RBC: 3.73 MIL/uL — ABNORMAL LOW (ref 3.87–5.11)
RDW: 14.6 % (ref 11.5–15.5)
WBC: 5.9 10*3/uL (ref 4.0–10.5)
nRBC: 0 % (ref 0.0–0.2)

## 2019-04-21 LAB — CMP (CANCER CENTER ONLY)
ALT: 11 U/L (ref 0–44)
AST: 12 U/L — ABNORMAL LOW (ref 15–41)
Albumin: 3.7 g/dL (ref 3.5–5.0)
Alkaline Phosphatase: 66 U/L (ref 38–126)
Anion gap: 8 (ref 5–15)
BUN: 14 mg/dL (ref 6–20)
CO2: 23 mmol/L (ref 22–32)
Calcium: 9.5 mg/dL (ref 8.9–10.3)
Chloride: 112 mmol/L — ABNORMAL HIGH (ref 98–111)
Creatinine: 1.22 mg/dL — ABNORMAL HIGH (ref 0.44–1.00)
GFR, Est AFR Am: 56 mL/min — ABNORMAL LOW (ref >60)
GFR, Estimated: 48 mL/min — ABNORMAL LOW (ref >60)
Glucose, Bld: 82 mg/dL (ref 70–99)
Potassium: 4.3 mmol/L (ref 3.5–5.1)
Sodium: 143 mmol/L (ref 135–145)
Total Bilirubin: 0.6 mg/dL (ref 0.3–1.2)
Total Protein: 7.5 g/dL (ref 6.5–8.1)

## 2019-04-21 MED ORDER — HEPARIN SOD (PORK) LOCK FLUSH 100 UNIT/ML IV SOLN
500.0000 [IU] | Freq: Once | INTRAVENOUS | Status: AC
  Administered 2019-04-21: 10:00:00 500 [IU] via INTRAVENOUS
  Filled 2019-04-21: qty 5

## 2019-04-21 MED ORDER — SODIUM CHLORIDE 0.9% FLUSH
10.0000 mL | Freq: Once | INTRAVENOUS | Status: AC
  Administered 2019-04-21: 10 mL
  Filled 2019-04-21: qty 10

## 2019-04-21 NOTE — Progress Notes (Signed)
HEMATOLOGY/ONCOLOGY CLINIC NOTE  Date of Service: 04/21/2019  Patient Care Team: Neale Burly, MD as PCP - General (Internal Medicine)  CHIEF COMPLAINTS/PURPOSE OF CONSULTATION:  F/u for continued mx of Multiple myeloma  Oncologic History:  63 y.o. female who initially presented with significant weight loss in the context of H. pylori-associated gastritis and alcohol abuse. At the same time, an incidental discovery of a lower thoracic vertebral mass in the context of chronic numbness and weakness in the lower extremities. MRI of the thoracic spine demonstrated a destructive lesion at T8 level and patient underwent surgical treatment. 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 Staging evaluation conducted by our clinic confirmed presence of IgG kappa multiple myeloma. Cytogenetics studies demonstrated normal karyotype, FISH studies are positive for 13q34 & D13S319 loss and no loss of p53. Based on initial information, patient was staged at R-[facility] II.   Patient has completed 6 cycles of induction systemic chemotherapy with lenalidomide, bortezomib, and low-dose dexamethasone.  Repeat bone marrow biopsy demonstrated excellent response to treatment and patient subsequently underwent consolidation therapy including melphalan conditioning and autologous stem cell rescue.  Repeat assessment with PET/CT and bone marrow biopsy on 10/13/2017 demonstrates stringent complete response maintenance.  HISTORY OF PRESENTING ILLNESS:   Christine Garrett is a wonderful 63 y.o. female who has been referred to [place] by my colleague Dr Grace Isaac for evaluation and management of Multiple myeloma. The pt reports that she is doing well overall.   The pt has previously been followed by my colleague Dr Grace Isaac and completed 6 cycles of induction with Revlimid, Velcade and dexamethasone. She had a autologous PBSCT on 07/01/17. She is on lenalidomide maintenance currently.   The  pt reports that she has had a headache for the past week, noting a constant pounding in the front of her forehead. She denies fevers and is always cold. She is having clear, nasal discharge and seasonal allergies. She notes aspirin has alleviated her headache some.   She is continuing to take Revlimid on 21 day cycles.  Most recent lab results (11/21/17) of CBC and CMP is as follows: all values are WNL.  MMP 11/21/17 is shows no M spike   On review of systems, pt reports headache, moving her bowels well, increasing strength, and denies mouth sores, fevers, night sweats, and any other symptoms.   INTERVAL HISTORY:   Christine Garrett returns today for management and evaluation of her Multiple Myeloma currently in remission. The patient's last visit with [place] was on 02/19/2019. The pt reports that she is doing well overall.  The pt reports that she has noticed increased fatigue and diarrhea. Her stools are very watery and she has about 3 bowel movements per day. Pt primarily drinks water and if she has to drink milk she drinks Lactaid. Pt reports that her stools do become more solid on her week off of Revlimid. She also notes almost constant acid reflux. She has not been taking any acid suppressants. Pt is currently seeing Dr. Birdie Sons, Cardiologist. She has had an EKG and they are awaiting a result. Dr. Olevia Bowens thought that pt may have a small blockage in her heart. He has also prescribed her Metaprolol. She continues to take 1000 mg of Metformin BID. Pt is currently drinking 4 beers per day. She is willing to cut down on her drinking but is not ready to quit. Pt has continued to try and stay active. She feels that her neuropathy is the same,  if not worse. She is taking a daily Vitamin D supplement but not Vitamin B12.  Lab results today (04/21/19) of CBC w/diff and CMP is as follows: all values are WNL except for RBC at 3.73, Chloride at 112, Creatinine at 1.22, AST at 12, GFR Est AFR Am at 56    04/21/2019 MMP is in progress  04/21/2019 K/L light chains is in progress  On review of systems, pt reports acid reflux, fatigue, diarrhea, neuropathy and denies rash and any other symptoms.    MEDICAL HISTORY:  Past Medical History:  Diagnosis Date   Arthritis    knees   Cancer (HCC)    Carpal tunnel syndrome    CHF (congestive heart failure) (HCC)    Diabetes mellitus    type 2   GERD (gastroesophageal reflux disease)    Heart murmur    "Little, No concerns" per Dr  Dannielle Burn pt reported.   Hypertension    controlled using a guided approch with plasma renin activity   Multiple myeloma not having achieved remission ([place]) 01/06/2017   Neuropathy    Peripheral vascular disease ([place])    Sleep apnea    Uses CPAP    SURGICAL HISTORY: Past Surgical History:  Procedure Laterality Date   BIOPSY  11/28/2016   Procedure: BIOPSY;  Surgeon: Rogene Houston, MD;  [facility];  Service: Endoscopy;;  gastric   BTL     1982   CERVICAL CONE BIOPSY     cervical lesion   ESOPHAGOGASTRODUODENOSCOPY (EGD) WITH PROPOFOL N/A 11/28/2016   Procedure: ESOPHAGOGASTRODUODENOSCOPY (EGD) WITH PROPOFOL;  Surgeon: Rogene Houston, MD;  [facility];  Service: Endoscopy;  Laterality: N/A;  9:25   IR FLUORO GUIDE PORT INSERTION RIGHT  01/29/2017   IR US GUIDE VASC ACCESS RIGHT  01/29/2017   lipoma removal     right shoulder 2001   MULTIPLE EXTRACTIONS WITH ALVEOLOPLASTY Bilateral 08/24/2012   Procedure: MULTIPLE EXTRACTION WITH ALVEOLOPLASTY BIOPSY OF RIGHT AND LEFT MANDIBLE ;  Surgeon: Gae Bon, DDS;  Location: [place];  Service: Oral Surgery;  Laterality: Bilateral;   POSTERIOR LUMBAR FUSION 4 LEVEL N/A 12/26/2016   Procedure: THORACIC EIGHT TUMOR RESECTION, THORACIC SIX- THORACIC TEN POSTERIOR SPINAL FUSION;  Surgeon: Ashok Pall, MD;  Location: [place];  Service: Neurosurgery;  Laterality: N/A;  THORACIC 8 TUMOR RESECTION, THORACIC 6- THORACIC 10 POSTERIOR  SPINAL FUSION   ROTATOR CUFF REPAIR     right shoulder   TUBAL LIGATION      SOCIAL HISTORY: Social History   Socioeconomic History   Marital status: Single    Spouse name: Not on file   Number of children: 3   Years of education: Not on file   Highest education level: Not on file  Occupational History   Occupation: on disability   Occupation: former CNA  Scientist, product/process development strain: Not on file   Food insecurity    Worry: Not on file    Inability: Not on file   Transportation needs    Medical: Not on file    Non-medical: Not on file  Tobacco Use   Smoking status: Former Smoker    Packs/day: 0.50    Years: 6.00    Pack years: 3.00    Types: Cigarettes    Quit date: 11/23/2011    Years since quitting: 7.4   Smokeless tobacco: Never Used   Tobacco comment: quit summer 2013  Substance and Sexual Activity   Alcohol  use: No    Alcohol/week: 6.0 standard drinks    Types: 6 Cans of beer per week   Drug use: No   Sexual activity: Not Currently    Birth control/protection: Post-menopausal  Lifestyle   Physical activity    Days per week: Not on file    Minutes per session: Not on file   Stress: Not on file  Relationships   Social connections    Talks on phone: Not on file    Gets together: Not on file    Attends religious service: Not on file    Active member of club or organization: Not on file    Attends meetings of clubs or organizations: Not on file    Relationship status: Not on file   Intimate partner violence    Fear of current or ex partner: Not on file    Emotionally abused: Not on file    Physically abused: Not on file    Forced sexual activity: Not on file  Other Topics Concern   Not on file  Social History Narrative   Lives with son, daughter and grandkids in a 2 story home but stays on the first floor.  Has 3 children.  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      Right handed    FAMILY  HISTORY: Family History  Problem Relation Age of Onset   Diabetes Mother    Hypertension Mother    Heart disease Mother    Alzheimer's disease Mother    Diabetes Father    Heart disease Sister    Diabetes Sister     ALLERGIES:  is allergic to hydrocodone and no known allergies.  MEDICATIONS:  Current Outpatient Medications  Medication Sig Dispense Refill   atorvastatin (LIPITOR) 40 MG tablet Take 40 mg by mouth daily.     ELIQUIS 5 MG TABS tablet TAKE 1 TABLET BY MOUTH TWICE A DAY 60 tablet 2   enalapril (VASOTEC) 20 MG tablet Take 20 mg by mouth 2 (two) times daily.      gabapentin (NEURONTIN) 300 MG capsule Take 3 capsules (900 mg total) by mouth 3 (three) times daily. 270 capsule 11   insulin degludec (TRESIBA FLEXTOUCH) 100 UNIT/ML SOPN FlexTouch Pen Inject 0.5 mLs (50 Units total) into the skin daily at 10 pm. 5 pen 2   Insulin Pen Needle (B-D ULTRAFINE III SHORT PEN) 31G X 8 MM MISC 1 each by Does not apply route as directed. 100 each 3   lenalidomide (REVLIMID) 5 MG capsule Take 1 capsule (5 mg total) by mouth daily. 3 weeks on 1 week off 21 capsule 0   metFORMIN (GLUCOPHAGE) 1000 MG tablet Take 1,000 mg by mouth 2 (two) times daily.      tiZANidine (ZANAFLEX) 4 MG tablet Take 1 tablet (4 mg total) by mouth every 6 (six) hours as needed for muscle spasms. 60 tablet 0   Vitamin D, Ergocalciferol, (DRISDOL) 50000 units CAPS capsule Take by mouth.     No current facility-administered medications for this visit.     REVIEW OF SYSTEMS:   A 10+ POINT REVIEW OF SYSTEMS WAS OBTAINED including neurology, dermatology, psychiatry, cardiac, respiratory, lymph, extremities, GI, GU, Musculoskeletal, constitutional, breasts, reproductive, HEENT.  All pertinent positives are noted in the HPI.  All others are negative.   PHYSICAL EXAMINATION: ECOG PERFORMANCE STATUS: 1 - Symptomatic but completely ambulatory   Vitals:   04/21/19 0952  BP: (!) 152/77  Pulse: 73  Resp:  18  Temp: 97.8 F (36.6 C)  SpO2: 100%   Filed Weights   04/21/19 0952  Weight: 225 lb 3.2 oz (102.2 kg)   Body mass index is 33.26 kg/m.  GENERAL:alert, in no acute distress and comfortable SKIN: no acute rashes, no significant lesions EYES: conjunctiva are pink and non-injected, sclera anicteric OROPHARYNX: MMM, no exudates, no oropharyngeal erythema or ulceration NECK: supple, no JVD LYMPH:  no palpable lymphadenopathy in the cervical, axillary or inguinal regions LUNGS: clear to auscultation b/l with normal respiratory effort HEART: regular rate & rhythm ABDOMEN:  normoactive bowel sounds , non tender, not distended. No palpable hepatosplenomegaly.  Extremity: trace pedal edema PSYCH: alert & oriented x 3 with fluent speech NEURO: no focal motor/sensory deficits  LABORATORY DATA:  I have reviewed the data as listed  . CBC Latest Ref Rng & Units 04/21/2019 02/19/2019 12/09/2018  WBC 4.0 - 10.5 K/uL 5.9 5.3 4.7  Hemoglobin 12.0 - 15.0 g/dL 12.1 12.2 12.2  Hematocrit 36.0 - 46.0 % 36.5 36.9 37.4  Platelets 150 - 400 K/uL 247 318 224    . CMP Latest Ref Rng & Units 04/21/2019 02/19/2019 12/09/2018  Glucose 70 - 99 mg/dL 82 84 110(H)  BUN 6 - 20 mg/dL '14 11 7  ' Creatinine 0.44 - 1.00 mg/dL 1.22(H) 1.19(H) 1.26(H)  Sodium 135 - 145 mmol/L 143 143 141  Potassium 3.5 - 5.1 mmol/L 4.3 4.0 4.2  Chloride 98 - 111 mmol/L 112(H) 110 109  CO2 22 - 32 mmol/L '23 24 23  ' Calcium 8.9 - 10.3 mg/dL 9.5 9.3 9.4  Total Protein 6.5 - 8.1 g/dL 7.5 7.4 7.7  Total Bilirubin 0.3 - 1.2 mg/dL 0.6 0.3 0.5  Alkaline Phos 38 - 126 U/L 66 61 63  AST 15 - 41 U/L 12(L) 16 18  ALT 0 - 44 U/L '11 14 15   ' Component     Latest Ref Rng & Units 02/19/2019  IgG (Immunoglobin G), Serum     586 - 1,602 mg/dL 1,246  IgA     87 - 352 mg/dL 267  IgM (Immunoglobulin M), Srm     26 - 217 mg/dL 45  Total Protein ELP     6.0 - 8.5 g/dL 6.9  Albumin SerPl Elph-Mcnc     2.9 - 4.4 g/dL 3.6  Alpha 1     0.0 - 0.4  g/dL 0.2  Alpha2 Glob SerPl Elph-Mcnc     0.4 - 1.0 g/dL 0.8  B-Globulin SerPl Elph-Mcnc     0.7 - 1.3 g/dL 1.1  Gamma Glob SerPl Elph-Mcnc     0.4 - 1.8 g/dL 1.3  M Protein SerPl Elph-Mcnc     Not Observed g/dL Not Observed  Globulin, Total     2.2 - 3.9 g/dL 3.3  Albumin/Glob SerPl     0.7 - 1.7 1.1  IFE 1      Comment  Please Note (HCV):      Comment   03/25/18 SPEP and SFLC:     04/21/17 Cytogenetics:    01/17/17 Cytogenetics:    RADIOGRAPHIC STUDIES: I have personally reviewed the radiological images as listed and agreed with the findings in the report. No results found.  10/13/17 PET/CT     ASSESSMENT & PLAN:  63 y.o. female with  1. Multiple myeloma Currently in remission Autologous PBSCT on 07/01/17. IgG kappa multiple myeloma. Cytogenetics studies demonstrated normal karyotype, FISH studies are positive for 13q34 & D13S319 loss and no  loss of p53. Based on initial information, patient was staged at R-ISS II.  Completed 6 cycles of induction with Revlimid, Velcade and dexamethasone  12/09/2018 lumbar spine xray with results revealing "No acute osseous abnormality. Extensive multilevel degenerative changes are noted throughout the lumbar spine."  12/09/2018 sacrum and coccyx xray with results revealing "No acute osseous abnormality. Degenerative changes are noted of both hips."  2. Grade 2 neuropathy -controlled  02/26/18 NCV with EMG which revealed The electrophysiologic findings are most consistent with a chronic, length-dependent sensorimotor axonal and demyelinating polyneuropathy affecting the right side. Overall, these findings are moderate to severe in degree electrically. Incidentally, there is a right Martin-Gruber anastomosis, a normal variant.   3. Rt LE calf veins - possible clot ?chronic.  01/29/18 VAS [place] BLE which revealed Right: Ultrasound is unable to distinguish whether obstruction in the Peroneal veins, and great saphenous vein, and  superficial veins/varciosities is acute or chronic. No cystic structure found in the popliteal fossa. Left: no evidence of DVT.    PLAN: -Discussed pt labwork today, 04/21/19; all values are WNL except for RBC at 3.73, Chloride at 112, Creatinine at 1.22, AST at 12, GFR Est AFR Am at 56  04/21/2019 MMP -- no M spike -02/19/2019 M Protein not observed  -Continue Zometa q12 weeks. No dental issues -Advised pt that EtOH intake may affect medication efficacy and worsen neuropathy, diarrhea and acid reflux  -Recommended pt minimize/stop EtOH intake -No prohibitive toxicities from Revlimid --continue current dose of maintenance Revlimid -Recommend OTC B-complex vitamin -Advised pt to f/u with PCP for sleeplessness -Will see back in 2 months    FOLLOW UP: -Continue Zometa q12 weeks with labs -ongoing -- plz schedule for 4 doses -RTC with Dr Irene Limbo with labs in 2 months with labs  The total time spent in the appt was 25 minutes and more than 50% was on counseling and direct patient cares.  All of the patient's questions were answered with apparent satisfaction. The patient knows to call the clinic with any problems, questions or concerns.   Sullivan Lone MD [place] AAHIVMS [facility] Hematology/Oncology Physician [employer]  (Office):       [phone] (Work cell):  [phone] (Fax):           [phone]  04/21/2019 11:30 AM  I, Yevette Edwards, am acting as a scribe for Dr. Sullivan Lone.   .I have reviewed the above documentation for accuracy and completeness, and I agree with the above. Brunetta Genera MD

## 2019-04-22 ENCOUNTER — Telehealth: Payer: Self-pay | Admitting: Hematology

## 2019-04-22 NOTE — Telephone Encounter (Signed)
Added additional appts per 11/4 los.

## 2019-04-23 LAB — MULTIPLE MYELOMA PANEL, SERUM
Albumin SerPl Elph-Mcnc: 3.7 g/dL (ref 2.9–4.4)
Albumin/Glob SerPl: 1.2 (ref 0.7–1.7)
Alpha 1: 0.2 g/dL (ref 0.0–0.4)
Alpha2 Glob SerPl Elph-Mcnc: 0.7 g/dL (ref 0.4–1.0)
B-Globulin SerPl Elph-Mcnc: 1 g/dL (ref 0.7–1.3)
Gamma Glob SerPl Elph-Mcnc: 1.2 g/dL (ref 0.4–1.8)
Globulin, Total: 3.2 g/dL (ref 2.2–3.9)
IgA: 234 mg/dL (ref 87–352)
IgG (Immunoglobin G), Serum: 1323 mg/dL (ref 586–1602)
IgM (Immunoglobulin M), Srm: 45 mg/dL (ref 26–217)
Total Protein ELP: 6.9 g/dL (ref 6.0–8.5)

## 2019-05-23 ENCOUNTER — Other Ambulatory Visit: Payer: Self-pay | Admitting: Hematology

## 2019-05-28 ENCOUNTER — Telehealth: Payer: Self-pay | Admitting: Hematology

## 2019-05-28 NOTE — Telephone Encounter (Signed)
[place] PAL 12/24. Per [place] moved appointments 2-3 weeks out. Patient already on scheduled for/fu 1/5. cancelled 12/24 lab/fu/infusion and rescheduled 12/24 infusion to 1/5 with lab/fu. Left message for patient. Schedule mailed. Patient also mychart active.

## 2019-06-03 ENCOUNTER — Telehealth (INDEPENDENT_AMBULATORY_CARE_PROVIDER_SITE_OTHER): Payer: Self-pay | Admitting: *Deleted

## 2019-06-03 ENCOUNTER — Encounter (INDEPENDENT_AMBULATORY_CARE_PROVIDER_SITE_OTHER): Payer: Self-pay | Admitting: *Deleted

## 2019-06-03 ENCOUNTER — Ambulatory Visit (INDEPENDENT_AMBULATORY_CARE_PROVIDER_SITE_OTHER): Payer: Medicare Other | Admitting: Nurse Practitioner

## 2019-06-03 ENCOUNTER — Other Ambulatory Visit: Payer: Self-pay

## 2019-06-03 ENCOUNTER — Encounter (INDEPENDENT_AMBULATORY_CARE_PROVIDER_SITE_OTHER): Payer: Self-pay | Admitting: Nurse Practitioner

## 2019-06-03 ENCOUNTER — Other Ambulatory Visit (INDEPENDENT_AMBULATORY_CARE_PROVIDER_SITE_OTHER): Payer: Self-pay | Admitting: *Deleted

## 2019-06-03 VITALS — BP 159/89 | HR 76 | Temp 96.9°F | Ht 69.5 in | Wt 226.7 lb

## 2019-06-03 DIAGNOSIS — C9001 Multiple myeloma in remission: Secondary | ICD-10-CM

## 2019-06-03 DIAGNOSIS — R1084 Generalized abdominal pain: Secondary | ICD-10-CM | POA: Diagnosis not present

## 2019-06-03 MED ORDER — SUPREP BOWEL PREP KIT 17.5-3.13-1.6 GM/177ML PO SOLN: 1.0000 | Freq: Once | ORAL | 0 refills | Status: AC

## 2019-06-03 NOTE — Patient Instructions (Signed)
1.  Schedule an abdominal/pelvic CAT scan with oral contrast only 2.  Schedule a colonoscopy 3.  Call our office if your abdominal pain worsens

## 2019-06-03 NOTE — Progress Notes (Signed)
Subjective:    Patient ID: Christine Garrett, female    DOB: August 16, 1955, 63 y.o.   MRN: [id number]  HPI Christine Garrett is a 63 year old female with a past medical history of malignant melanoma to the thoracic spine diagnosed in 2018 with chemotherapy from August 2018 - November 2018 with Bortezomib, Lenalidomide, and Dexamethasone and s/p autologous stem cell transplantation in January 2019, right lower extremity DVT 2018 with recurrence in 2019 on Eliquis,  athritis,  hypertension, CHF, peripheral, diabetes mellitus type 2, peripheral neuropathy, sleep apnea uses CPAP 2-3 nights weekly and GERD.  She reports having central lower abdominal pain that comes and goes for several years. Her abdominal pain has not changed since having labs done 04/2019 by her hematologist, Dr. Irene Limbo.  Her bowel pattern varies, she can pass a solid stool, soft or loose stool once or twice daily.  No rectal bleeding or melena.  No fever or sweats.  She often feels cold.  Family history of colorectal cancer.  Reports having a colonoscopy approximately 10 years ago at [facility] which she stated was normal.  Her GERD symptoms are well controlled.  She underwent a normal EGD in 11/28/2016.  CBC Latest Ref Rng & Units 04/21/2019 02/19/2019 12/09/2018  WBC 4.0 - 10.5 K/uL 5.9 5.3 4.7  Hemoglobin 12.0 - 15.0 g/dL 12.1 12.2 12.2  Hematocrit 36.0 - 46.0 % 36.5 36.9 37.4  Platelets 150 - 400 K/uL 247 318 224   CMP Latest Ref Rng & Units 04/21/2019 02/19/2019 12/09/2018  Glucose 70 - 99 mg/dL 82 84 110(H)  BUN 6 - 20 mg/dL _0 Creatinine 0.44 - 1.00 mg/dL 1.22(H) 1.19(H) 1.26(H)  Sodium 135 - 145 mmol/L 143 143 141  Potassium 3.5 - 5.1 mmol/L 4.3 4.0 4.2  Chloride 98 - 111 mmol/L 112(H) 110 109  CO2 22 - 32 mmol/L _1 Calcium 8.9 - 10.3 mg/dL 9.5 9.3 9.4  Total Protein 6.5 - 8.1 g/dL 7.5 7.4 7.7  Total Bilirubin 0.3 - 1.2 mg/dL 0.6 0.3 0.5  Alkaline Phos 38 - 126 U/L 66 61 63  AST 15 - 41 U/L 12(L) 16 18   ALT 0 - 44 U/L _2 Past Medical History:  Diagnosis Date  . Arthritis    knees  . Cancer ([place])   . Carpal tunnel syndrome   . CHF (congestive heart failure) ([place])   . Diabetes mellitus    type 2  . GERD (gastroesophageal reflux disease)   . Heart murmur    "Little, No concerns" per Dr  Dannielle Burn pt reported.  . Hypertension    controlled using a guided approch with plasma renin activity  . Multiple myeloma not having achieved remission ([place]) 01/06/2017  . Neuropathy   . Peripheral vascular disease ([place])   . Sleep apnea    Uses CPAP   Past Surgical History:  Procedure Laterality Date  . BIOPSY  11/28/2016   Procedure: BIOPSY;  Surgeon: Rogene Houston, MD;  [facility];  Service: Endoscopy;;  gastric  . [place]  . CERVICAL CONE BIOPSY     cervical lesion  . ESOPHAGOGASTRODUODENOSCOPY (EGD) WITH PROPOFOL N/A 11/28/2016   Procedure: ESOPHAGOGASTRODUODENOSCOPY (EGD) WITH PROPOFOL;  Surgeon: Rogene Houston, MD;  [facility];  Service: Endoscopy;  Laterality: N/A;  9:25  . IR FLUORO GUIDE PORT INSERTION RIGHT  01/29/2017  . IR US GUIDE VASC ACCESS RIGHT  01/29/2017  .  lipoma removal     right shoulder 2001  . MULTIPLE EXTRACTIONS WITH ALVEOLOPLASTY Bilateral 08/24/2012   Procedure: MULTIPLE EXTRACTION WITH ALVEOLOPLASTY BIOPSY OF RIGHT AND LEFT MANDIBLE ;  Surgeon: Gae Bon, DDS;  Location: [place];  Service: Oral Surgery;  Laterality: Bilateral;  . POSTERIOR LUMBAR FUSION 4 LEVEL N/A 12/26/2016   Procedure: THORACIC EIGHT TUMOR RESECTION, THORACIC SIX- THORACIC TEN POSTERIOR SPINAL FUSION;  Surgeon: Ashok Pall, MD;  Location: [place];  Service: Neurosurgery;  Laterality: N/A;  THORACIC 8 TUMOR RESECTION, THORACIC 6- THORACIC 10 POSTERIOR SPINAL FUSION  . ROTATOR CUFF REPAIR     right shoulder  . TUBAL LIGATION     Current Outpatient Medications on File Prior to Visit  Medication Sig Dispense Refill  . atorvastatin (LIPITOR) 40 MG tablet  Take 40 mg by mouth daily.    Marland Kitchen ELIQUIS 5 MG TABS tablet TAKE 1 TABLET BY MOUTH TWICE A DAY 60 tablet 2  . enalapril (VASOTEC) 20 MG tablet Take 20 mg by mouth 2 (two) times daily.     Marland Kitchen gabapentin (NEURONTIN) 300 MG capsule Take 3 capsules (900 mg total) by mouth 3 (three) times daily. 270 capsule 11  . insulin degludec (TRESIBA FLEXTOUCH) 100 UNIT/ML SOPN FlexTouch Pen Inject 0.5 mLs (50 Units total) into the skin daily at 10 pm. 5 pen 2  . Insulin Pen Needle (B-D ULTRAFINE III SHORT PEN) 31G X 8 MM MISC 1 each by Does not apply route as directed. 100 each 3  . metFORMIN (GLUCOPHAGE) 1000 MG tablet Take 1,000 mg by mouth 2 (two) times daily.     Marland Kitchen REVLIMID 5 MG capsule TAKE 1 CAPSULE (5MG) BY  MOUTH DAILY FOR 21 DAYS ON, THEN 7 DAYS OFF 21 capsule 0  . Vitamin D, Ergocalciferol, (DRISDOL) 50000 units CAPS capsule Take 50,000 Units by mouth every 7 (seven) days.      No current facility-administered medications on file prior to visit.   Allergies  Allergen Reactions  . Hydrocodone Other (See Comments)    MENTAL STATUS CHANGE  MENTAL STATUS CHANGE   (Pt states she is not allergic to Hydrocodone, she was recently taking it) MENTAL STATUS CHANGE    . No Known Allergies     Review of Systems the HPI, all other systems reviewed and are negative    Objective:   Physical Exam  BP (!) 159/89 (BP Location: Right Arm, Patient Position: Sitting, Cuff Size: Large)   Pulse 76   Temp (!) 96.9 F (36.1 C) (Oral)   Ht 5' 9.5" (1.765 m)   Wt 226 lb 11.2 oz (102.8 kg)   BMI 33.00 kg/m   General: 63 year old female well-developed in no acute distress Eyes: Sclera nonicteric, conjunctiva pink Mouth: No ulcers or lesions Neck: Supple, no lymphadenopathy or thyromegaly Heart: Regular rate and rhythm, no murmurs Lungs: Breath sounds clear throughout Abdomen: Generalized lower abdominal tenderness without rebound or guarding, positive bowel sounds to all 4 quadrants, no HSM Extremities: No edema  Neuro: Alert and oriented x4, no focal deficits     Assessment & Plan:   15.  63 year old female with a significant medical history of malignant melanoma to the thoracic spine and DVT x 2 on Eliquis who presents with complaints of intermittent lower abdominal pain -Abdominal/pelvic CAT scan with oral contrast only (cr 1.22) -Schedule a colonoscopy, benefits and risks discussed including risk with sedation, risk of bleeding, perforation and infection -Her office will contact the patient's PCP to verify if it  is okay to hold Eliquis for 2 days prior to her colonoscopy -Patient to call our office if her symptoms worsen -Repeat labs to include CBC with diff, CMP and CRP if her abdominal pain worsens  2.  GERD, asymptomatic  3.  Hypertension  -Advised patient to follow-up with her primary care physician regarding elevated blood pressure   4. DM II

## 2019-06-03 NOTE — Telephone Encounter (Signed)
Patient needs suprep TCS sch'd 1/15

## 2019-06-07 ENCOUNTER — Telehealth (INDEPENDENT_AMBULATORY_CARE_PROVIDER_SITE_OTHER): Payer: Self-pay | Admitting: *Deleted

## 2019-06-07 NOTE — Telephone Encounter (Signed)
Per Dr Sherrie Sport it is ok for patient to stop Eliquis 2 days before TCS sch'd 07/02/19, patient aware

## 2019-06-10 ENCOUNTER — Ambulatory Visit: Payer: Medicare Other | Admitting: Hematology

## 2019-06-10 ENCOUNTER — Other Ambulatory Visit: Payer: Medicare Other

## 2019-06-10 ENCOUNTER — Ambulatory Visit: Payer: Medicare Other

## 2019-06-21 ENCOUNTER — Encounter: Payer: Self-pay | Admitting: Neurology

## 2019-06-21 ENCOUNTER — Other Ambulatory Visit: Payer: Self-pay

## 2019-06-21 ENCOUNTER — Telehealth (INDEPENDENT_AMBULATORY_CARE_PROVIDER_SITE_OTHER): Payer: Medicare Other | Admitting: Neurology

## 2019-06-21 VITALS — Ht 69.0 in | Wt 226.0 lb

## 2019-06-21 DIAGNOSIS — C9000 Multiple myeloma not having achieved remission: Secondary | ICD-10-CM | POA: Diagnosis not present

## 2019-06-21 DIAGNOSIS — G63 Polyneuropathy in diseases classified elsewhere: Secondary | ICD-10-CM

## 2019-06-21 DIAGNOSIS — G620 Drug-induced polyneuropathy: Secondary | ICD-10-CM

## 2019-06-21 MED ORDER — GABAPENTIN 300 MG PO CAPS: 600.0000 mg | ORAL_CAPSULE | Freq: Three times a day (TID) | ORAL | 3 refills | Status: DC

## 2019-06-21 NOTE — Progress Notes (Addendum)
   Due to the COVID-19 crisis, this virtual check-in visit was done via telephone from my office and it was initiated and consent given by this patient and or family.   Virtual Check in Visit The purpose of this virtual visit is to provide medical care while limiting exposure to the novel coronavirus.    Consent was obtained for virtual check in visit and initiated by pt/family:  Yes.   Answered questions that patient had about telehealth interaction:  Yes.   I discussed the limitations, risks, security and privacy concerns of performing an evaluation and management service by telephone. I also discussed with the patient that there may be a patient responsible charge related to this service. The patient expressed understanding and agreed to proceed.  Pt location: Home Physician Location: office Name of referring provider:  Neale Burly, MD I connected with Christine Garrett at patients initiation/request on 06/21/2019 at 10:30 AM EST by telephone and verified that I am speaking with the correct person using two identifiers.  Pt MRN:  [id number] Pt DOB:  03/20/1956   History of Present Illness: This is a 64 year old female returning for follow-up of neuropathy.  At her last office visit, she was taking gabapentin 900 mg 3 times daily and also reported swelling of the legs, and due to concern this may have been medication induced, she was recommended to reduce her gabapentin and currently takes 600 mg twice daily.  She has noticed that swelling has improved, but reports increased intensity of burning pain in the feet and numbness.  She denies any falls and is very compliant with using her cane at all times.  She denies any new weakness of the hands or legs.  Diabetes remains well controlled and her multiple myeloma is in remission.   Observations/Objective:   Vitals:   06/21/19 1003  Weight: 226 lb (102.5 kg)  Height: _0  (1.753 m)    Assessment and Plan:   Length dependent peripheral  neuropathy involving the hand and legs manifesting with numbness and tingling. Risk factors:  History of prior alcohol use and chemotherapy with bortezomib. Since reducing her gabapentin to 600 mg twice daily, pain has intensified. I will increase her gabapentin to 600 mg 3 times daily and have asked her to monitor for signs of worsening edema. Fall precautions discussed, she is very compliant with using a cane  Need for in person visit now:  No.  Follow Up Instructions:   I discussed the assessment and treatment plan with the patient. The patient was provided an opportunity to ask questions and all were answered. The patient agreed with the plan and demonstrated an understanding of the instructions.   The patient was advised to call back or seek an in-person evaluation if the symptoms worsen or if the condition fails to improve as anticipated.  Return to clinic in 1 year  Total Time spent in visit with the patient was:  8 min, of which 100% of the time was spent in counseling and/or coordinating care.   Pt understands and agrees with the plan of care outlined.     Alda Berthold, DO

## 2019-06-22 ENCOUNTER — Other Ambulatory Visit: Payer: Self-pay

## 2019-06-22 ENCOUNTER — Inpatient Hospital Stay: Payer: Medicare Other

## 2019-06-22 ENCOUNTER — Inpatient Hospital Stay: Payer: Medicare Other | Attending: Hematology

## 2019-06-22 ENCOUNTER — Encounter: Payer: Self-pay | Admitting: Hematology

## 2019-06-22 ENCOUNTER — Inpatient Hospital Stay (HOSPITAL_BASED_OUTPATIENT_CLINIC_OR_DEPARTMENT_OTHER): Payer: Medicare Other | Admitting: Hematology

## 2019-06-22 VITALS — BP 164/68 | HR 76 | Temp 98.3°F | Resp 18 | Ht 69.0 in | Wt 227.2 lb

## 2019-06-22 DIAGNOSIS — C9001 Multiple myeloma in remission: Secondary | ICD-10-CM

## 2019-06-22 DIAGNOSIS — Z95828 Presence of other vascular implants and grafts: Secondary | ICD-10-CM

## 2019-06-22 LAB — CBC WITH DIFFERENTIAL/PLATELET
Abs Immature Granulocytes: 0.01 10*3/uL (ref 0.00–0.07)
Basophils Absolute: 0.1 10*3/uL (ref 0.0–0.1)
Basophils Relative: 1 %
Eosinophils Absolute: 0.2 10*3/uL (ref 0.0–0.5)
Eosinophils Relative: 3 %
HCT: 34.9 % — ABNORMAL LOW (ref 36.0–46.0)
Hemoglobin: 11.8 g/dL — ABNORMAL LOW (ref 12.0–15.0)
Immature Granulocytes: 0 %
Lymphocytes Relative: 45 %
Lymphs Abs: 3.1 10*3/uL (ref 0.7–4.0)
MCH: 32.9 pg (ref 26.0–34.0)
MCHC: 33.8 g/dL (ref 30.0–36.0)
MCV: 97.2 fL (ref 80.0–100.0)
Monocytes Absolute: 0.4 10*3/uL (ref 0.1–1.0)
Monocytes Relative: 5 %
Neutro Abs: 3.1 10*3/uL (ref 1.7–7.7)
Neutrophils Relative %: 46 %
Platelets: 240 10*3/uL (ref 150–400)
RBC: 3.59 MIL/uL — ABNORMAL LOW (ref 3.87–5.11)
RDW: 14.5 % (ref 11.5–15.5)
WBC: 6.8 10*3/uL (ref 4.0–10.5)
nRBC: 0 % (ref 0.0–0.2)

## 2019-06-22 LAB — CMP (CANCER CENTER ONLY)
ALT: 17 U/L (ref 0–44)
AST: 19 U/L (ref 15–41)
Albumin: 3.8 g/dL (ref 3.5–5.0)
Alkaline Phosphatase: 85 U/L (ref 38–126)
Anion gap: 10 (ref 5–15)
BUN: 13 mg/dL (ref 8–23)
CO2: 22 mmol/L (ref 22–32)
Calcium: 9.5 mg/dL (ref 8.9–10.3)
Chloride: 110 mmol/L (ref 98–111)
Creatinine: 1.06 mg/dL — ABNORMAL HIGH (ref 0.44–1.00)
GFR, Est AFR Am: >60 mL/min (ref >60)
GFR, Estimated: 57 mL/min — ABNORMAL LOW (ref >60)
Glucose, Bld: 86 mg/dL (ref 70–99)
Potassium: 4.1 mmol/L (ref 3.5–5.1)
Sodium: 142 mmol/L (ref 135–145)
Total Bilirubin: 0.5 mg/dL (ref 0.3–1.2)
Total Protein: 7.3 g/dL (ref 6.5–8.1)

## 2019-06-22 MED ORDER — HEPARIN SOD (PORK) LOCK FLUSH 100 UNIT/ML IV SOLN
500.0000 [IU] | Freq: Once | INTRAVENOUS | Status: AC
  Administered 2019-06-22: 500 [IU] via INTRAVENOUS
  Filled 2019-06-22: qty 5

## 2019-06-22 MED ORDER — ZOLEDRONIC ACID 4 MG/100ML IV SOLN
4.0000 mg | Freq: Once | INTRAVENOUS | Status: AC
  Administered 2019-06-22: 4 mg via INTRAVENOUS

## 2019-06-22 MED ORDER — SODIUM CHLORIDE 0.9% FLUSH
10.0000 mL | Freq: Once | INTRAVENOUS | Status: AC
  Administered 2019-06-22: 10 mL
  Filled 2019-06-22: qty 10

## 2019-06-22 MED ORDER — ZOLEDRONIC ACID 4 MG/100ML IV SOLN
INTRAVENOUS | Status: AC
  Filled 2019-06-22: qty 100

## 2019-06-22 MED ORDER — SODIUM CHLORIDE 0.9 % IV SOLN
INTRAVENOUS | Status: DC
  Filled 2019-06-22: qty 250

## 2019-06-22 NOTE — Patient Instructions (Signed)
Zoledronic Acid injection (Hypercalcemia, Oncology) What is this medicine? ZOLEDRONIC ACID (ZOE le dron ik AS id) lowers the amount of calcium loss from bone. It is used to treat too much calcium in your blood from cancer. It is also used to prevent complications of cancer that has spread to the bone. This medicine may be used for other purposes; ask your health care provider or pharmacist if you have questions. COMMON BRAND NAME(S): Zometa What should I tell my health care provider before I take this medicine? They need to know if you have any of these conditions:  aspirin-sensitive asthma  cancer, especially if you are receiving medicines used to treat cancer  dental disease or wear dentures  infection  kidney disease  receiving corticosteroids like dexamethasone or prednisone  an unusual or allergic reaction to zoledronic acid, other medicines, foods, dyes, or preservatives  pregnant or trying to get pregnant  breast-feeding How should I use this medicine? This medicine is for infusion into a vein. It is given by a health care professional in a hospital or clinic setting. Talk to your pediatrician regarding the use of this medicine in children. Special care may be needed. Overdosage: If you think you have taken too much of this medicine contact a poison control center or emergency room at once. NOTE: This medicine is only for you. Do not share this medicine with others. What if I miss a dose? It is important not to miss your dose. Call your doctor or health care professional if you are unable to keep an appointment. What may interact with this medicine?  certain antibiotics given by injection  NSAIDs, medicines for pain and inflammation, like ibuprofen or naproxen  some diuretics like bumetanide, furosemide  teriparatide  thalidomide This list may not describe all possible interactions. Give your health care provider a list of all the medicines, herbs, non-prescription  drugs, or dietary supplements you use. Also tell them if you smoke, drink alcohol, or use illegal drugs. Some items may interact with your medicine. What should I watch for while using this medicine? Visit your doctor or health care professional for regular checkups. It may be some time before you see the benefit from this medicine. Do not stop taking your medicine unless your doctor tells you to. Your doctor may order blood tests or other tests to see how you are doing. Women should inform their doctor if they wish to become pregnant or think they might be pregnant. There is a potential for serious side effects to an unborn child. Talk to your health care professional or pharmacist for more information. You should make sure that you get enough calcium and vitamin D while you are taking this medicine. Discuss the foods you eat and the vitamins you take with your health care professional. Some people who take this medicine have severe bone, joint, and/or muscle pain. This medicine may also increase your risk for jaw problems or a broken thigh bone. Tell your doctor right away if you have severe pain in your jaw, bones, joints, or muscles. Tell your doctor if you have any pain that does not go away or that gets worse. Tell your dentist and dental surgeon that you are taking this medicine. You should not have major dental surgery while on this medicine. See your dentist to have a dental exam and fix any dental problems before starting this medicine. Take good care of your teeth while on this medicine. Make sure you see your dentist for regular follow-up   appointments. What side effects may I notice from receiving this medicine? Side effects that you should report to your doctor or health care professional as soon as possible:  allergic reactions like skin rash, itching or hives, swelling of the face, lips, or tongue  anxiety, confusion, or depression  breathing problems  changes in vision  eye  pain  feeling faint or lightheaded, falls  jaw pain, especially after dental work  mouth sores  muscle cramps, stiffness, or weakness  redness, blistering, peeling or loosening of the skin, including inside the mouth  trouble passing urine or change in the amount of urine Side effects that usually do not require medical attention (report to your doctor or health care professional if they continue or are bothersome):  bone, joint, or muscle pain  constipation  diarrhea  fever  hair loss  irritation at site where injected  loss of appetite  nausea, vomiting  stomach upset  trouble sleeping  trouble swallowing  weak or tired This list may not describe all possible side effects. Call your doctor for medical advice about side effects. You may report side effects to FDA at 1-800-FDA-1088. Where should I keep my medicine? This drug is given in a hospital or clinic and will not be stored at home. NOTE: This sheet is a summary. It may not cover all possible information. If you have questions about this medicine, talk to your doctor, pharmacist, or health care provider.  2020 [employer] Standard (2013-10-30 14:19:39)  

## 2019-06-22 NOTE — Progress Notes (Signed)
HEMATOLOGY/ONCOLOGY CLINIC NOTE  Date of Service: 06/22/2019  Patient Care Team: Neale Burly, MD as PCP - General (Internal Medicine) Alda Berthold, DO as Consulting Physician (Neurology)  CHIEF COMPLAINTS/PURPOSE OF CONSULTATION:  F/u for continued mx of Multiple myeloma  Oncologic History:  64 y.o. female who initially presented with significant weight loss in the context of H. pylori-associated gastritis and alcohol abuse. At the same time, an incidental discovery of a lower thoracic vertebral mass in the context of chronic numbness and weakness in the lower extremities. MRI of the thoracic spine demonstrated a destructive lesion at T8 level and patient underwent surgical treatment. 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 Staging evaluation conducted by our clinic confirmed presence of IgG kappa multiple myeloma. Cytogenetics studies demonstrated normal karyotype, FISH studies are positive for 13q34 & D13S319 loss and no loss of p53. Based on initial information, patient was staged at R-ISS II.   Patient has completed 6 cycles of induction systemic chemotherapy with lenalidomide, bortezomib, and low-dose dexamethasone.  Repeat bone marrow biopsy demonstrated excellent response to treatment and patient subsequently underwent consolidation therapy including melphalan conditioning and autologous stem cell rescue.  Repeat assessment with PET/CT and bone marrow biopsy on 10/13/2017 demonstrates stringent complete response maintenance.  HISTORY OF PRESENTING ILLNESS:   Christine Garrett is a wonderful 64 y.o. female who has been referred to [place] by my colleague Dr Grace Isaac for evaluation and management of Multiple myeloma. The pt reports that she is doing well overall.   The pt has previously been followed by my colleague Dr Grace Isaac and completed 6 cycles of induction with Revlimid, Velcade and dexamethasone. She had a autologous PBSCT on  07/01/17. She is on lenalidomide maintenance currently.   The pt reports that she has had a headache for the past week, noting a constant pounding in the front of her forehead. She denies fevers and is always cold. She is having clear, nasal discharge and seasonal allergies. She notes aspirin has alleviated her headache some.   She is continuing to take Revlimid on 21 day cycles.  Most recent lab results (11/21/17) of CBC and CMP is as follows: all values are WNL.  MMP 11/21/17 is shows no M spike   On review of systems, pt reports headache, moving her bowels well, increasing strength, and denies mouth sores, fevers, night sweats, and any other symptoms.   INTERVAL HISTORY:  Christine Garrett returns today for management and evaluation of her Multiple Myeloma currently in remission. The patient's last visit with [place] was on 04/21/2019. The pt reports that she is doing well overall.  The pt reports that she spoke with her Neurologist yesterday and she feels that her neuropathy is permanent. Pt does not think that the neuropathy has worsened. Her neurologist lowered her dose of Gabapentin to 600 mg per day. She notes that her right leg began swelling 3 days ago but does not hurt. Pt has continued to take her Eliquis as prescribed and denies any issues taking Revlimid. Pt was on her feet more so than normal cooking over the holidays.   She is still having issues with sleeplessness and will see her PCP on 07/02/19. She also has a CT Abd scheduled on 06/25/19 that was ordered by her GI due to the pt having some abdominal discomfort. Pt has been having some low-level nausea about an hour after she eats. She also has a Colonoscopy scheduled. Pt had some UTI like symptoms and used  AZO, the symptoms have since resolved. Pt decided to quit drinking alcohol at the beginning of the year.   Lab results today (06/22/19) of CBC w/diff and CMP is as follows: all values are WNL except for RBC at 3.59, Hgb at 11.8, HCT at  34.9, Creatinine at 1.06. 06/22/2019 MMP is in progress   On review of systems, pt reports right leg swelling, chills, sleeplessness, neuropathy in finger/toes, abdominal discomfort, nausea, loose stools and denies SOB, chest pain, leg pain, fevers, new back pain, new bone pain, other infection symptoms, mouth sores, urinary symptoms and any other symptoms.   MEDICAL HISTORY:  Past Medical History:  Diagnosis Date  . Arthritis    knees  . Cancer ([place])   . Carpal tunnel syndrome   . CHF (congestive heart failure) ([place])   . Diabetes mellitus    type 2  . GERD (gastroesophageal reflux disease)   . Heart murmur    "Little, No concerns" per Dr  Dannielle Burn pt reported.  . Hypertension    controlled using a guided approch with plasma renin activity  . Multiple myeloma not having achieved remission ([place]) 01/06/2017  . Neuropathy   . Peripheral vascular disease ([place])   . Sleep apnea    Uses CPAP    SURGICAL HISTORY: Past Surgical History:  Procedure Laterality Date  . BIOPSY  11/28/2016   Procedure: BIOPSY;  Surgeon: Rogene Houston, MD;  [facility];  Service: Endoscopy;;  gastric  . [place]  . CERVICAL CONE BIOPSY     cervical lesion  . ESOPHAGOGASTRODUODENOSCOPY (EGD) WITH PROPOFOL N/A 11/28/2016   Procedure: ESOPHAGOGASTRODUODENOSCOPY (EGD) WITH PROPOFOL;  Surgeon: Rogene Houston, MD;  [facility];  Service: Endoscopy;  Laterality: N/A;  9:25  . IR FLUORO GUIDE PORT INSERTION RIGHT  01/29/2017  . IR US GUIDE VASC ACCESS RIGHT  01/29/2017  . lipoma removal     right shoulder 2001  . MULTIPLE EXTRACTIONS WITH ALVEOLOPLASTY Bilateral 08/24/2012   Procedure: MULTIPLE EXTRACTION WITH ALVEOLOPLASTY BIOPSY OF RIGHT AND LEFT MANDIBLE ;  Surgeon: Gae Bon, DDS;  Location: [facility];  Service: Oral Surgery;  Laterality: Bilateral;  . POSTERIOR LUMBAR FUSION 4 LEVEL N/A 12/26/2016   Procedure: THORACIC EIGHT TUMOR RESECTION, THORACIC SIX- THORACIC TEN POSTERIOR  SPINAL FUSION;  Surgeon: Ashok Pall, MD;  Location: [facility];  Service: Neurosurgery;  Laterality: N/A;  THORACIC 8 TUMOR RESECTION, THORACIC 6- THORACIC 10 POSTERIOR SPINAL FUSION  . ROTATOR CUFF REPAIR     right shoulder  . TUBAL LIGATION      SOCIAL HISTORY: Social History   Socioeconomic History  . Marital status: Single    Spouse name: Not on file  . Number of children: 3  . Years of education: Not on file  . Highest education level: Not on file  Occupational History  . Occupation: on disability  . Occupation: former Quarry manager  Tobacco Use  . Smoking status: Former Smoker    Packs/day: 0.50    Years: 6.00    Pack years: 3.00    Types: Cigarettes    Quit date: 11/23/2011    Years since quitting: 7.5  . Smokeless tobacco: Never Used  . Tobacco comment: quit summer 2013  Substance and Sexual Activity  . Alcohol use: No    Alcohol/week: 6.0 standard drinks    Types: 6 Cans of beer per week  . Drug use: No  . Sexual activity: Not Currently  Birth control/protection: Post-menopausal  Other Topics Concern  . Not on file  Social History Narrative   Lives with son, daughter and grandkids in a 2 story home but stays on the first floor.  Has 3 children.  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      Right handed   Social Determinants of Health   Financial Resource Strain:   . Difficulty of Paying Living Expenses: Not on file  Food Insecurity:   . Worried About Charity fundraiser in the Last Year: Not on file  . Ran Out of Food in the Last Year: Not on file  Transportation Needs:   . Lack of Transportation (Medical): Not on file  . Lack of Transportation (Non-Medical): Not on file  Physical Activity:   . Days of Exercise per Week: Not on file  . Minutes of Exercise per Session: Not on file  Stress:   . Feeling of Stress : Not on file  Social Connections:   . Frequency of Communication with Friends and Family: Not on file  . Frequency of Social  Gatherings with Friends and Family: Not on file  . Attends Religious Services: Not on file  . Active Member of Clubs or Organizations: Not on file  . Attends Archivist Meetings: Not on file  . Marital Status: Not on file  Intimate Partner Violence:   . Fear of Current or Ex-Partner: Not on file  . Emotionally Abused: Not on file  . Physically Abused: Not on file  . Sexually Abused: Not on file    FAMILY HISTORY: Family History  Problem Relation Age of Onset  . Diabetes Mother   . Hypertension Mother   . Heart disease Mother   . Alzheimer's disease Mother   . Diabetes Father   . Heart disease Sister   . Diabetes Sister     ALLERGIES:  is allergic to hydrocodone and no known allergies.  MEDICATIONS:  Current Outpatient Medications  Medication Sig Dispense Refill  . atorvastatin (LIPITOR) 40 MG tablet Take 40 mg by mouth daily.    Marland Kitchen ELIQUIS 5 MG TABS tablet TAKE 1 TABLET BY MOUTH TWICE A DAY 60 tablet 2  . enalapril (VASOTEC) 20 MG tablet Take 20 mg by mouth 2 (two) times daily.     Marland Kitchen gabapentin (NEURONTIN) 300 MG capsule Take 2 capsules (600 mg total) by mouth 3 (three) times daily. 540 capsule 3  . insulin degludec (TRESIBA FLEXTOUCH) 100 UNIT/ML SOPN FlexTouch Pen Inject 0.5 mLs (50 Units total) into the skin daily at 10 pm. 5 pen 2  . Insulin Pen Needle (B-D ULTRAFINE III SHORT PEN) 31G X 8 MM MISC 1 each by Does not apply route as directed. 100 each 3  . metFORMIN (GLUCOPHAGE) 1000 MG tablet Take 1,000 mg by mouth 2 (two) times daily.     . metoprolol succinate (TOPROL-XL) 50 MG 24 hr tablet Take 50 mg by mouth daily. Take with or immediately following a meal.    . REVLIMID 5 MG capsule TAKE 1 CAPSULE (5MG) BY  MOUTH DAILY FOR 21 DAYS ON, THEN 7 DAYS OFF 21 capsule 0  . Vitamin D, Ergocalciferol, (DRISDOL) 50000 units CAPS capsule Take 50,000 Units by mouth every 7 (seven) days.     . potassium chloride (MICRO-K) 10 MEQ CR capsule Take 10 mEq by mouth daily.      No current facility-administered medications for this visit.   Facility-Administered Medications  Ordered in Other Visits  Medication Dose Route Frequency Provider Last Rate Last Admin  . 0.9 %  sodium chloride infusion   Intravenous Continuous Brunetta Genera, MD 20 mL/hr at 06/22/19 1425 New Bag at 06/22/19 1425  . heparin lock flush 100 unit/mL  500 Units Intravenous Once Brunetta Genera, MD      . sodium chloride flush (NS) 0.9 % injection 10 mL  10 mL Intracatheter Once Brunetta Genera, MD      . Zoledronic Acid (ZOMETA) IVPB 4 mg  4 mg Intravenous Once Brunetta Genera, MD 400 mL/hr at 06/22/19 1427 4 mg at 06/22/19 1427    REVIEW OF SYSTEMS:   A 10+ POINT REVIEW OF SYSTEMS WAS OBTAINED including neurology, dermatology, psychiatry, cardiac, respiratory, lymph, extremities, GI, GU, Musculoskeletal, constitutional, breasts, reproductive, HEENT.  All pertinent positives are noted in the HPI.  All others are negative.   PHYSICAL EXAMINATION: ECOG PERFORMANCE STATUS: 1 - Symptomatic but completely ambulatory   Vitals:   06/22/19 1331  BP: (!) 164/68  Pulse: 76  Resp: 18  Temp: 98.3 F (36.8 C)  SpO2: 100%   Filed Weights   06/22/19 1331  Weight: 227 lb 3.2 oz (103.1 kg)   Body mass index is 33.55 kg/m.  Exam was given in a chair   GENERAL:alert, in no acute distress and comfortable SKIN: no acute rashes, no significant lesions EYES: conjunctiva are pink and non-injected, sclera anicteric OROPHARYNX: MMM, no exudates, no oropharyngeal erythema or ulceration NECK: supple, no JVD LYMPH:  no palpable lymphadenopathy in the cervical, axillary or inguinal regions LUNGS: clear to auscultation b/l with normal respiratory effort HEART: regular rate & rhythm ABDOMEN:  normoactive bowel sounds , non tender, not distended. No palpable hepatosplenomegaly.  Extremity: no pedal edema PSYCH: alert & oriented x 3 with fluent speech NEURO: no focal motor/sensory  deficits  LABORATORY DATA:  I have reviewed the data as listed  . CBC Latest Ref Rng & Units 06/22/2019 04/21/2019 02/19/2019  WBC 4.0 - 10.5 K/uL 6.8 5.9 5.3  Hemoglobin 12.0 - 15.0 g/dL 11.8(L) 12.1 12.2  Hematocrit 36.0 - 46.0 % 34.9(L) 36.5 36.9  Platelets 150 - 400 K/uL 240 247 318    . CMP Latest Ref Rng & Units 06/22/2019 04/21/2019 02/19/2019  Glucose 70 - 99 mg/dL 86 82 84  BUN 8 - 23 mg/dL _0 Creatinine 0.44 - 1.00 mg/dL 1.06(H) 1.22(H) 1.19(H)  Sodium 135 - 145 mmol/L 142 143 143  Potassium 3.5 - 5.1 mmol/L 4.1 4.3 4.0  Chloride 98 - 111 mmol/L 110 112(H) 110  CO2 22 - 32 mmol/L _1 Calcium 8.9 - 10.3 mg/dL 9.5 9.5 9.3  Total Protein 6.5 - 8.1 g/dL 7.3 7.5 7.4  Total Bilirubin 0.3 - 1.2 mg/dL 0.5 0.6 0.3  Alkaline Phos 38 - 126 U/L 85 66 61  AST 15 - 41 U/L 19 12(L) 16  ALT 0 - 44 U/L _2 Component     Latest Ref Rng & Units 02/19/2019  IgG (Immunoglobin G), Serum     586 - 1,602 mg/dL 1,246  IgA     87 - 352 mg/dL 267  IgM (Immunoglobulin M), Srm     26 - 217 mg/dL 45  Total Protein ELP     6.0 - 8.5 g/dL 6.9  Albumin SerPl Elph-Mcnc     2.9 - 4.4 g/dL 3.6  Alpha 1     0.0 -  0.4 g/dL 0.2  Alpha2 Glob SerPl Elph-Mcnc     0.4 - 1.0 g/dL 0.8  B-Globulin SerPl Elph-Mcnc     0.7 - 1.3 g/dL 1.1  Gamma Glob SerPl Elph-Mcnc     0.4 - 1.8 g/dL 1.3  M Protein SerPl Elph-Mcnc     Not Observed g/dL Not Observed  Globulin, Total     2.2 - 3.9 g/dL 3.3  Albumin/Glob SerPl     0.7 - 1.7 1.1  IFE 1      Comment  Please Note (HCV):      Comment   03/25/18 SPEP and SFLC:     04/21/17 Cytogenetics:    01/17/17 Cytogenetics:    RADIOGRAPHIC STUDIES: I have personally reviewed the radiological images as listed and agreed with the findings in the report. No results found.  10/13/17 PET/CT     ASSESSMENT & PLAN:  64 y.o. female with  1. Multiple myeloma Currently in remission Autologous PBSCT on 07/01/17. IgG kappa multiple myeloma.  Cytogenetics studies demonstrated normal karyotype, FISH studies are positive for 13q34 & D13S319 loss and no loss of p53. Based on initial information, patient was staged at R-ISS II.  Completed 6 cycles of induction with Revlimid, Velcade and dexamethasone  12/09/2018 lumbar spine xray with results revealing "No acute osseous abnormality. Extensive multilevel degenerative changes are noted throughout the lumbar spine."  12/09/2018 sacrum and coccyx xray with results revealing "No acute osseous abnormality. Degenerative changes are noted of both hips."  2. Grade 2 neuropathy -controlled  02/26/18 NCV with EMG which revealed The electrophysiologic findings are most consistent with a chronic, length-dependent sensorimotor axonal and demyelinating polyneuropathy affecting the right side. Overall, these findings are moderate to severe in degree electrically. Incidentally, there is a right Martin-Gruber anastomosis, a normal variant.   3. Rt LE calf veins - possible clot ?chronic.  01/29/18 VAS [place] BLE which revealed Right: Ultrasound is unable to distinguish whether obstruction in the Peroneal veins, and great saphenous vein, and superficial veins/varciosities is acute or chronic. No cystic structure found in the popliteal fossa. Left: no evidence of DVT.    PLAN: -Discussed pt labwork today, 06/22/19;  all values are WNL except for RBC at 3.59, Hgb at 11.8, HCT at 34.9, Creatinine at 1.06. -Discussed 06/22/2019 MMP shows no M spike -04/21/2019 MMP -- no M spike  -No clinical or laboratory evidence of Multiple Myeloma progression at this time -No prohibitive toxicities from Revlimid - continue 5 mg of maintenance Revlimid  -Continue Zometa q12 weeks. No dental issues -Continue 5 mg Eliquis BID -Refill Revlimid -Will see back in 2 months with labs    FOLLOW UP: Zometa today and continue Zometa q12 weeks with labs -ongoing -- plz schedule for 4 doses -RTC with Dr Irene Limbo with labs in 2 months with  labs  The total time spent in the appt was 15 minutes and more than 50% was on counseling and direct patient cares.  All of the patient's questions were answered with apparent satisfaction. The patient knows to call the clinic with any problems, questions or concerns.    Sullivan Lone MD [facility] AAHIVMS [facility] Hematology/Oncology Physician [facility]  (Office):       [phone] (Work cell):  [phone] (Fax):           [phone]  06/22/2019 2:29 PM  I, Yevette Edwards, am acting as a scribe for Dr. Sullivan Lone.   .I have reviewed the above documentation for accuracy and completeness, and  I agree with the above. Brunetta Genera MD

## 2019-06-24 ENCOUNTER — Telehealth: Payer: Self-pay | Admitting: Hematology

## 2019-06-24 LAB — MULTIPLE MYELOMA PANEL, SERUM
Albumin SerPl Elph-Mcnc: 3.9 g/dL (ref 2.9–4.4)
Albumin/Glob SerPl: 1.4 (ref 0.7–1.7)
Alpha 1: 0.1 g/dL (ref 0.0–0.4)
Alpha2 Glob SerPl Elph-Mcnc: 0.8 g/dL (ref 0.4–1.0)
B-Globulin SerPl Elph-Mcnc: 0.9 g/dL (ref 0.7–1.3)
Gamma Glob SerPl Elph-Mcnc: 1.1 g/dL (ref 0.4–1.8)
Globulin, Total: 2.9 g/dL (ref 2.2–3.9)
IgA: 211 mg/dL (ref 87–352)
IgG (Immunoglobin G), Serum: 1207 mg/dL (ref 586–1602)
IgM (Immunoglobulin M), Srm: 32 mg/dL (ref 26–217)
Total Protein ELP: 6.8 g/dL (ref 6.0–8.5)

## 2019-06-24 NOTE — Telephone Encounter (Signed)
appts requested in 1/5 los already scheduled

## 2019-06-25 ENCOUNTER — Other Ambulatory Visit: Payer: Self-pay

## 2019-06-25 ENCOUNTER — Ambulatory Visit (HOSPITAL_COMMUNITY)
Admission: RE | Admit: 2019-06-25 | Discharge: 2019-06-25 | Disposition: A | Discharge status: Home / Self Care | Payer: Medicare Other | Source: Ambulatory Visit | Attending: Nurse Practitioner | Admitting: Nurse Practitioner

## 2019-06-25 DIAGNOSIS — C9001 Multiple myeloma in remission: Secondary | ICD-10-CM | POA: Insufficient documentation

## 2019-06-25 DIAGNOSIS — R1084 Generalized abdominal pain: Secondary | ICD-10-CM | POA: Insufficient documentation

## 2019-06-25 DIAGNOSIS — K8020 Calculus of gallbladder without cholecystitis without obstruction: Secondary | ICD-10-CM | POA: Diagnosis not present

## 2019-06-30 ENCOUNTER — Other Ambulatory Visit: Payer: Self-pay

## 2019-06-30 ENCOUNTER — Other Ambulatory Visit (HOSPITAL_COMMUNITY)
Admission: RE | Admit: 2019-06-30 | Discharge: 2019-06-30 | Disposition: A | Discharge status: Home / Self Care | Payer: Medicare Other | Source: Ambulatory Visit | Attending: Internal Medicine | Admitting: Internal Medicine

## 2019-06-30 ENCOUNTER — Encounter (HOSPITAL_COMMUNITY)
Admission: RE | Admit: 2019-06-30 | Discharge: 2019-06-30 | Disposition: A | Discharge status: Home / Self Care | Payer: Medicare Other | Source: Ambulatory Visit | Attending: Internal Medicine | Admitting: Internal Medicine

## 2019-06-30 DIAGNOSIS — Z20822 Contact with and (suspected) exposure to covid-19: Secondary | ICD-10-CM | POA: Insufficient documentation

## 2019-06-30 DIAGNOSIS — Z01812 Encounter for preprocedural laboratory examination: Secondary | ICD-10-CM | POA: Diagnosis not present

## 2019-06-30 LAB — SARS CORONAVIRUS 2 (TAT 6-24 HRS): SARS Coronavirus 2: NEGATIVE

## 2019-07-01 DIAGNOSIS — N182 Chronic kidney disease, stage 2 (mild): Secondary | ICD-10-CM | POA: Diagnosis not present

## 2019-07-01 DIAGNOSIS — J449 Chronic obstructive pulmonary disease, unspecified: Secondary | ICD-10-CM | POA: Diagnosis not present

## 2019-07-01 DIAGNOSIS — Z0000 Encounter for general adult medical examination without abnormal findings: Secondary | ICD-10-CM | POA: Diagnosis not present

## 2019-07-01 DIAGNOSIS — E1121 Type 2 diabetes mellitus with diabetic nephropathy: Secondary | ICD-10-CM | POA: Diagnosis not present

## 2019-07-01 DIAGNOSIS — E7849 Other hyperlipidemia: Secondary | ICD-10-CM | POA: Diagnosis not present

## 2019-07-01 DIAGNOSIS — I5042 Chronic combined systolic (congestive) and diastolic (congestive) heart failure: Secondary | ICD-10-CM | POA: Diagnosis not present

## 2019-07-01 DIAGNOSIS — I10 Essential (primary) hypertension: Secondary | ICD-10-CM | POA: Diagnosis not present

## 2019-07-01 DIAGNOSIS — Z1389 Encounter for screening for other disorder: Secondary | ICD-10-CM | POA: Diagnosis not present

## 2019-07-02 ENCOUNTER — Ambulatory Visit (HOSPITAL_COMMUNITY): Payer: Medicare Other | Admitting: Anesthesiology

## 2019-07-02 ENCOUNTER — Other Ambulatory Visit: Payer: Self-pay

## 2019-07-02 ENCOUNTER — Encounter (HOSPITAL_COMMUNITY)
Admission: RE | Disposition: A | Discharge status: Home / Self Care | Payer: Self-pay | Source: Home / Self Care | Attending: Internal Medicine

## 2019-07-02 ENCOUNTER — Encounter (HOSPITAL_COMMUNITY): Payer: Self-pay | Admitting: Internal Medicine

## 2019-07-02 ENCOUNTER — Ambulatory Visit (HOSPITAL_COMMUNITY)
Admission: RE | Admit: 2019-07-02 | Discharge: 2019-07-02 | Disposition: A | Discharge status: Home / Self Care | Payer: Medicare Other | Attending: Internal Medicine | Admitting: Internal Medicine

## 2019-07-02 DIAGNOSIS — D124 Benign neoplasm of descending colon: Secondary | ICD-10-CM | POA: Diagnosis not present

## 2019-07-02 DIAGNOSIS — M170 Bilateral primary osteoarthritis of knee: Secondary | ICD-10-CM | POA: Diagnosis not present

## 2019-07-02 DIAGNOSIS — E1151 Type 2 diabetes mellitus with diabetic peripheral angiopathy without gangrene: Secondary | ICD-10-CM | POA: Insufficient documentation

## 2019-07-02 DIAGNOSIS — D123 Benign neoplasm of transverse colon: Secondary | ICD-10-CM | POA: Diagnosis not present

## 2019-07-02 DIAGNOSIS — Z833 Family history of diabetes mellitus: Secondary | ICD-10-CM | POA: Insufficient documentation

## 2019-07-02 DIAGNOSIS — Z800 Family history of malignant neoplasm of digestive organs: Secondary | ICD-10-CM | POA: Diagnosis not present

## 2019-07-02 DIAGNOSIS — K219 Gastro-esophageal reflux disease without esophagitis: Secondary | ICD-10-CM | POA: Diagnosis not present

## 2019-07-02 DIAGNOSIS — Z1211 Encounter for screening for malignant neoplasm of colon: Secondary | ICD-10-CM | POA: Insufficient documentation

## 2019-07-02 DIAGNOSIS — K6389 Other specified diseases of intestine: Secondary | ICD-10-CM | POA: Diagnosis not present

## 2019-07-02 DIAGNOSIS — Z87891 Personal history of nicotine dependence: Secondary | ICD-10-CM | POA: Diagnosis not present

## 2019-07-02 DIAGNOSIS — Z981 Arthrodesis status: Secondary | ICD-10-CM | POA: Diagnosis not present

## 2019-07-02 DIAGNOSIS — R1084 Generalized abdominal pain: Secondary | ICD-10-CM

## 2019-07-02 DIAGNOSIS — I110 Hypertensive heart disease with heart failure: Secondary | ICD-10-CM | POA: Insufficient documentation

## 2019-07-02 DIAGNOSIS — K635 Polyp of colon: Secondary | ICD-10-CM | POA: Diagnosis not present

## 2019-07-02 DIAGNOSIS — Z7901 Long term (current) use of anticoagulants: Secondary | ICD-10-CM | POA: Diagnosis not present

## 2019-07-02 DIAGNOSIS — G4730 Sleep apnea, unspecified: Secondary | ICD-10-CM | POA: Diagnosis not present

## 2019-07-02 DIAGNOSIS — Z79899 Other long term (current) drug therapy: Secondary | ICD-10-CM | POA: Diagnosis not present

## 2019-07-02 DIAGNOSIS — I509 Heart failure, unspecified: Secondary | ICD-10-CM | POA: Insufficient documentation

## 2019-07-02 DIAGNOSIS — I10 Essential (primary) hypertension: Secondary | ICD-10-CM | POA: Diagnosis not present

## 2019-07-02 DIAGNOSIS — Z8249 Family history of ischemic heart disease and other diseases of the circulatory system: Secondary | ICD-10-CM | POA: Diagnosis not present

## 2019-07-02 DIAGNOSIS — C9001 Multiple myeloma in remission: Secondary | ICD-10-CM

## 2019-07-02 DIAGNOSIS — Z794 Long term (current) use of insulin: Secondary | ICD-10-CM | POA: Insufficient documentation

## 2019-07-02 DIAGNOSIS — C9000 Multiple myeloma not having achieved remission: Secondary | ICD-10-CM | POA: Diagnosis not present

## 2019-07-02 DIAGNOSIS — D122 Benign neoplasm of ascending colon: Secondary | ICD-10-CM | POA: Diagnosis not present

## 2019-07-02 DIAGNOSIS — D125 Benign neoplasm of sigmoid colon: Secondary | ICD-10-CM | POA: Diagnosis not present

## 2019-07-02 HISTORY — PX: COLONOSCOPY WITH PROPOFOL: SHX5780

## 2019-07-02 HISTORY — PX: POLYPECTOMY: SHX5525

## 2019-07-02 HISTORY — PX: BIOPSY: SHX5522

## 2019-07-02 LAB — GLUCOSE, CAPILLARY
Glucose-Capillary: 118 mg/dL — ABNORMAL HIGH (ref 70–99)
Glucose-Capillary: 92 mg/dL (ref 70–99)

## 2019-07-02 SURGERY — COLONOSCOPY WITH PROPOFOL
Anesthesia: General

## 2019-07-02 MED ORDER — PROPOFOL 10 MG/ML IV BOLUS
INTRAVENOUS | Status: AC
  Filled 2019-07-02: qty 20

## 2019-07-02 MED ORDER — LACTATED RINGERS IV SOLN: INTRAVENOUS | Status: DC | PRN

## 2019-07-02 MED ORDER — PROPOFOL 10 MG/ML IV BOLUS
INTRAVENOUS | Status: DC | PRN
  Administered 2019-07-02: 40 mg via INTRAVENOUS

## 2019-07-02 MED ORDER — KETAMINE HCL 50 MG/5ML IJ SOSY
PREFILLED_SYRINGE | INTRAMUSCULAR | Status: AC
  Filled 2019-07-02: qty 5

## 2019-07-02 MED ORDER — LIDOCAINE 2% (20 MG/ML) 5 ML SYRINGE
INTRAMUSCULAR | Status: AC
  Filled 2019-07-02: qty 10

## 2019-07-02 MED ORDER — LIDOCAINE HCL (CARDIAC) PF 50 MG/5ML IV SOSY
PREFILLED_SYRINGE | INTRAVENOUS | Status: DC | PRN
  Administered 2019-07-02: 50 mg via INTRAVENOUS

## 2019-07-02 MED ORDER — GLYCOPYRROLATE PF 0.2 MG/ML IJ SOSY
PREFILLED_SYRINGE | INTRAMUSCULAR | Status: AC
  Filled 2019-07-02: qty 1

## 2019-07-02 MED ORDER — KETAMINE HCL 10 MG/ML IJ SOLN
INTRAMUSCULAR | Status: DC | PRN
  Administered 2019-07-02: 20 mg via INTRAVENOUS
  Administered 2019-07-02: 10 mg via INTRAVENOUS

## 2019-07-02 MED ORDER — PROPOFOL 500 MG/50ML IV EMUL
INTRAVENOUS | Status: DC | PRN
  Administered 2019-07-02: 200 ug/kg/min via INTRAVENOUS
  Administered 2019-07-02: 150 ug/kg/min via INTRAVENOUS
  Administered 2019-07-02: 100 ug/kg/min via INTRAVENOUS

## 2019-07-02 MED ORDER — CHLORHEXIDINE GLUCONATE CLOTH 2 % EX PADS: 6.0000 | MEDICATED_PAD | Freq: Once | CUTANEOUS | Status: DC

## 2019-07-02 MED ORDER — DICYCLOMINE HCL 10 MG PO CAPS: 10.0000 mg | ORAL_CAPSULE | Freq: Three times a day (TID) | ORAL | 1 refills | Status: DC | PRN

## 2019-07-02 NOTE — H&P (Signed)
Christine Garrett is an 64 y.o. female.   Chief Complaint: Patient is here for colonoscopy HPI: Patient is 64 year old Afro-American female who is here for screening colonoscopy.  She says her last exam was probably be 15 years ago.  She denies rectal bleeding.  She has been experiencing lower abdominal pain over the last 1 week.  Pain has been intermittent.  She states she has 3 stools per day.  There usually mushy.  Change in stool consistency occurred since she has been on regular meds.  She has good appetite and has not lost any weight. She denies dysuria or hematuria. Family history is positive for CRC in her father who was 9 years old at the time of diagnosis and did fine with surgery.  He died 5 years later because of CHF.  Past Medical History:  Diagnosis Date  . Arthritis    knees  . Cancer ([place])   . Carpal tunnel syndrome   . CHF (congestive heart failure) ([place])   . Diabetes mellitus    type 2  . GERD (gastroesophageal reflux disease)   . Heart murmur    "Little, No concerns" per Dr  Dannielle Burn pt reported.  . Hypertension    controlled using a guided approch with plasma renin activity  . Multiple myeloma not having achieved remission ([place]) 01/06/2017  . Neuropathy   . Peripheral vascular disease ([place])   . Sleep apnea    Uses CPAP    Past Surgical History:  Procedure Laterality Date  . BIOPSY  11/28/2016   Procedure: BIOPSY;  Surgeon: Rogene Houston, MD;  [facility];  Service: Endoscopy;;  gastric  . [place]  . CERVICAL CONE BIOPSY     cervical lesion  . ESOPHAGOGASTRODUODENOSCOPY (EGD) WITH PROPOFOL N/A 11/28/2016   Procedure: ESOPHAGOGASTRODUODENOSCOPY (EGD) WITH PROPOFOL;  Surgeon: Rogene Houston, MD;  [facility];  Service: Endoscopy;  Laterality: N/A;  9:25  . IR FLUORO GUIDE PORT INSERTION RIGHT  01/29/2017  . IR US GUIDE VASC ACCESS RIGHT  01/29/2017  . lipoma removal     right shoulder 2001  . MULTIPLE EXTRACTIONS WITH ALVEOLOPLASTY  Bilateral 08/24/2012   Procedure: MULTIPLE EXTRACTION WITH ALVEOLOPLASTY BIOPSY OF RIGHT AND LEFT MANDIBLE ;  Surgeon: Gae Bon, DDS;  Location: [place];  Service: Oral Surgery;  Laterality: Bilateral;  . POSTERIOR LUMBAR FUSION 4 LEVEL N/A 12/26/2016   Procedure: THORACIC EIGHT TUMOR RESECTION, THORACIC SIX- THORACIC TEN POSTERIOR SPINAL FUSION;  Surgeon: Ashok Pall, MD;  Location: [place];  Service: Neurosurgery;  Laterality: N/A;  THORACIC 8 TUMOR RESECTION, THORACIC 6- THORACIC 10 POSTERIOR SPINAL FUSION  . ROTATOR CUFF REPAIR     right shoulder  . TUBAL LIGATION      Family History  Problem Relation Age of Onset  . Diabetes Mother   . Hypertension Mother   . Heart disease Mother   . Alzheimer's disease Mother   . Diabetes Father   . Heart disease Sister   . Diabetes Sister    Social History:  reports that she quit smoking about 7 years ago. Her smoking use included cigarettes. She has a 3.00 pack-year smoking history. She has never used smokeless tobacco. She reports that she does not drink alcohol or use drugs.  Allergies:  Allergies  Allergen Reactions  . Hydrocodone Other (See Comments)    MENTAL STATUS CHANGE  MENTAL STATUS CHANGE   (Pt states she is not allergic to Hydrocodone, she  was recently taking it) MENTAL STATUS CHANGE      Medications Prior to Admission  Medication Sig Dispense Refill  . atorvastatin (LIPITOR) 40 MG tablet Take 40 mg by mouth daily.    Marland Kitchen ELIQUIS 5 MG TABS tablet TAKE 1 TABLET BY MOUTH TWICE A DAY (Patient taking differently: Take 5 mg by mouth 2 (two) times daily. ) 60 tablet 2  . enalapril (VASOTEC) 20 MG tablet Take 20 mg by mouth 2 (two) times daily.     Marland Kitchen gabapentin (NEURONTIN) 600 MG tablet Take 600 mg by mouth 3 (three) times daily.    . insulin degludec (TRESIBA FLEXTOUCH) 100 UNIT/ML SOPN FlexTouch Pen Inject 0.5 mLs (50 Units total) into the skin daily at 10 pm. 5 pen 2  . metFORMIN (GLUCOPHAGE) 1000 MG tablet Take 1,000 mg by  mouth 2 (two) times daily.     . metoprolol succinate (TOPROL-XL) 50 MG 24 hr tablet Take 50 mg by mouth daily. Take with or immediately following a meal.    . potassium chloride (MICRO-K) 10 MEQ CR capsule Take 10 mEq by mouth daily.    Marland Kitchen REVLIMID 5 MG capsule TAKE 1 CAPSULE (5MG) BY  MOUTH DAILY FOR 21 DAYS ON, THEN 7 DAYS OFF (Patient taking differently: Take 5 mg by mouth See admin instructions. TAKE 1 CAPSULE (5MG) BY  MOUTH DAILY FOR 21 DAYS ON, THEN 7 DAYS OFF) 21 capsule 0  . Vitamin D, Ergocalciferol, (DRISDOL) 50000 units CAPS capsule Take 50,000 Units by mouth every Monday.     . gabapentin (NEURONTIN) 300 MG capsule Take 2 capsules (600 mg total) by mouth 3 (three) times daily. (Patient not taking: Reported on 06/23/2019) 540 capsule 3  . Insulin Pen Needle (B-D ULTRAFINE III SHORT PEN) 31G X 8 MM MISC 1 each by Does not apply route as directed. 100 each 3  . SUPREP BOWEL PREP KIT 17.5-3.13-1.6 GM/177ML SOLN Take 354 mLs by mouth once.      Results for orders placed or performed during the hospital encounter of 07/02/19 (from the past 48 hour(s))  Glucose, capillary     Status: Abnormal   Collection Time: 07/02/19 11:24 AM  Result Value Ref Range   Glucose-Capillary 118 (H) 70 - 99 mg/dL   No results found.  Review of Systems  Blood pressure (!) 177/90, pulse 82, temperature 98.4 F (36.9 C), resp. rate 17, SpO2 100 %. Physical Exam  Constitutional: She appears well-developed and well-nourished.  HENT:  Mouth/Throat: Oropharynx is clear and moist.  Eyes: Conjunctivae are normal. No scleral icterus.  Neck: No thyromegaly present.  Cardiovascular: Normal rate and regular rhythm.  Murmur heard. Faint systolic murmur best heard at aortic area.  Respiratory: Effort normal and breath sounds normal.  GI:  Abdomen is full.  Symmetrical.  On palpation is soft.  There is mild tenderness in periumbilical region and hypogastrium.  No organomegaly or masses  Musculoskeletal:         General: No edema.  Neurological: She is alert.  Skin: Skin is warm and dry.     Assessment/Plan Screening colonoscopy. Family history of CRC in first-degree relative age 69.  Hildred Laser, MD 07/02/2019, 12:03 PM

## 2019-07-02 NOTE — Anesthesia Preprocedure Evaluation (Signed)
Anesthesia Evaluation  Patient identified by MRN, date of birth, ID band Patient awake    Reviewed: Allergy & Precautions, NPO status , Patient's Chart, lab work & pertinent test results  Airway Mallampati: II  TM Distance: >3 FB Neck ROM: Full    Dental no notable dental hx. (+) Teeth Intact   Pulmonary sleep apnea , former smoker,  Reports unable to tolerate CPAP Known pulm nodule they are following  Ex smoker -quit 2013   Pulmonary exam normal breath sounds clear to auscultation       Cardiovascular Exercise Tolerance: Poor hypertension, Pt. on medications + Peripheral Vascular Disease and +CHF  Normal cardiovascular examII Rhythm:Regular Rate:Normal  ET limited  Reports h/o CHF   Neuro/Psych  Neuromuscular disease negative psych ROS   GI/Hepatic Neg liver ROS, GERD  Medicated and Controlled,  Endo/Other  negative endocrine ROSdiabetes, Type 1, Insulin Dependent, Oral Hypoglycemic Agents  Renal/GU negative Renal ROS  negative genitourinary   Musculoskeletal  (+) Arthritis , Osteoarthritis,    Abdominal   Peds negative pediatric ROS (+)  Hematology negative hematology ROS (+)   Anesthesia Other Findings S/p Autologous Stem cell transplant for MM State MM in remission   Reproductive/Obstetrics negative OB ROS                             Anesthesia Physical Anesthesia Plan  ASA: III  Anesthesia Plan: General   Post-op Pain Management:    Induction: Intravenous  PONV Risk Score and Plan: 3 and Midazolam, TIVA and Propofol infusion  Airway Management Planned: Simple Face Mask and Nasal Cannula  Additional Equipment:   Intra-op Plan:   Post-operative Plan:   Informed Consent: I have reviewed the patients History and Physical, chart, labs and discussed the procedure including the risks, benefits and alternatives for the proposed anesthesia with the patient or authorized  representative who has indicated his/her understanding and acceptance.     Dental advisory given  Plan Discussed with: CRNA  Anesthesia Plan Comments: (Plan Full PPE use  Plan GA with GETA as needed d/w pt -WTP with same after Q&A)        Anesthesia Quick Evaluation

## 2019-07-02 NOTE — Anesthesia Procedure Notes (Signed)
Date/Time: 07/02/2019 12:06 PM Performed by: Vista Deck, CRNA Pre-anesthesia Checklist: Patient identified, Emergency Drugs available, Suction available, Timeout performed and Patient being monitored Patient Re-evaluated:Patient Re-evaluated prior to induction Oxygen Delivery Method: Nasal Cannula

## 2019-07-02 NOTE — Transfer of Care (Signed)
Immediate Anesthesia Transfer of Care Note  Patient: Christine Garrett  Procedure(s) Performed: COLONOSCOPY WITH PROPOFOL (N/A ) BIOPSY POLYPECTOMY  Patient Location: PACU  Anesthesia Type:General  Level of Consciousness: awake, alert  and patient cooperative  Airway & Oxygen Therapy: Patient Spontanous Breathing  Post-op Assessment: Report given to RN and Post -op Vital signs reviewed and stable  Post vital signs: Reviewed and stable  Last Vitals:  Vitals Value Taken Time  BP    Temp    Pulse    Resp 25 07/02/19 1258  SpO2    Vitals shown include unvalidated device data.  Last Pain:  Vitals:   07/02/19 1141  PainSc: 0-No pain      Patients Stated Pain Goal: 7 ([id number])  Complications: No apparent anesthesia complications

## 2019-07-02 NOTE — Discharge Instructions (Signed)
Resume Eliquis/apixaban on 07/03/2019. Resume other medications as before. Dicyclomine 10 mg by mouth up to 3 times a day as needed for abdominal pain.   Resume usual diet. No driving for 24 hours. Physician will call with biopsy results.    Monitored Anesthesia Care, Care After These instructions provide you with information about caring for yourself after your procedure. Your health care provider may also give you more specific instructions. Your treatment has been planned according to current medical practices, but problems sometimes occur. Call your health care provider if you have any problems or questions after your procedure. What can I expect after the procedure? After your procedure, you may:  Feel sleepy for several hours.  Feel clumsy and have poor balance for several hours.  Feel forgetful about what happened after the procedure.  Have poor judgment for several hours.  Feel nauseous or vomit.  Have a sore throat if you had a breathing tube during the procedure. Follow these instructions at home: For at least 24 hours after the procedure:      Have a responsible adult stay with you. It is important to have someone help care for you until you are awake and alert.  Rest as needed.  Do not: ? Participate in activities in which you could fall or become injured. ? Drive. ? Use heavy machinery. ? Drink alcohol. ? Take sleeping pills or medicines that cause drowsiness. ? Make important decisions or sign legal documents. ? Take care of children on your own. Eating and drinking  Follow the diet that is recommended by your health care provider.  If you vomit, drink water, juice, or soup when you can drink without vomiting.  Make sure you have little or no nausea before eating solid foods. General instructions  Take over-the-counter and prescription medicines only as told by your health care provider.  If you have sleep apnea, surgery and certain medicines can  increase your risk for breathing problems. Follow instructions from your health care provider about wearing your sleep device: ? Anytime you are sleeping, including during daytime naps. ? While taking prescription pain medicines, sleeping medicines, or medicines that make you drowsy.  If you smoke, do not smoke without supervision.  Keep all follow-up visits as told by your health care provider. This is important. Contact a health care provider if:  You keep feeling nauseous or you keep vomiting.  You feel light-headed.  You develop a rash.  You have a fever. Get help right away if:  You have trouble breathing. Summary  For several hours after your procedure, you may feel sleepy and have poor judgment.  Have a responsible adult stay with you for at least 24 hours or until you are awake and alert. This information is not intended to replace advice given to you by your health care provider. Make sure you discuss any questions you have with your health care provider. Document Revised: 09/01/2017 Document Reviewed: 09/24/2015 Elsevier Patient Education  [place].

## 2019-07-02 NOTE — Op Note (Signed)
[facility] Patient Name: Christine Garrett Procedure Date: 07/02/2019 11:16 AM MRN: [id number] Date of Birth: 1956/01/20 Attending MD: Hildred Laser , MD CSN: [id number] Age: 64 Admit Type: Outpatient Procedure:                Colonoscopy Indications:              Screening for colorectal malignant neoplasm,                            Screening in patient at increased risk: Colorectal                            cancer in father 26 or older Providers:                Hildred Laser, MD, Janeece Riggers, RN, Randa Spike,                            Technician Referring MD:             Stoney Bang, MD Medicines:                Propofol per Anesthesia Complications:            No immediate complications. Estimated Blood Loss:     Estimated blood loss was minimal. Procedure:                Pre-Anesthesia Assessment:                           - Prior to the procedure, a History and Physical                            was performed, and patient medications and                            allergies were reviewed. The patient's tolerance of                            previous anesthesia was also reviewed. The risks                            and benefits of the procedure and the sedation                            options and risks were discussed with the patient.                            All questions were answered, and informed consent                            was obtained. Prior Anticoagulants: The patient                            last took Eliquis (apixaban) 2 days prior to the  procedure. ASA Grade Assessment: III - A patient                            with severe systemic disease. After reviewing the                            risks and benefits, the patient was deemed in                            satisfactory condition to undergo the procedure.                           After obtaining informed consent, the colonoscope                            was  passed under direct vision. Throughout the                            procedure, the patient's blood pressure, pulse, and                            oxygen saturations were monitored continuously. The                            [id number] scope was introduced through                            the anus and advanced to the the terminal ileum,                            with identification of the appendiceal orifice and                            IC valve. The colonoscopy was performed without                            difficulty. The patient tolerated the procedure                            well. The quality of the bowel preparation was                            adequate to identify polyps. The ileocecal valve,                            appendiceal orifice, and rectum were photographed. Scope In: 12:15:46 PM Scope Out: 12:50:19 PM Scope Withdrawal Time: 0 hours 24 minutes 54 seconds  Total Procedure Duration: 0 hours 34 minutes 33 seconds  Findings:      The perianal and digital rectal examinations were normal.      The terminal ileum appeared normal.      Eight sessile polyps were found in the sigmoid colon, descending colon       and transverse colon. The polyps were small in size. These  were biopsied       with a cold forceps for histology. The pathology specimen was placed       into Bottle Number 1.      Two sessile polyps were found in the sigmoid colon. The polyps were       small in size. Removed with cold snare. The pathology specimen was       placed into Bottle Number 1.      An area of mildly congested mucosa was found in the sigmoid colon and in       the descending colon. This was biopsied with a cold forceps for       histology. The pathology specimen was placed into Bottle Number 2. Impression:               - The examined portion of the ileum was normal.                           - Eight small polyps in the sigmoid colon, in the                             descending colon and in the transverse colon.                            Biopsied.                           - Two small polyps in the sigmoid colon. Cold                            snared.                           - Congested mucosa in the sigmoid colon and in the                            descending colon. Biopsied. Moderate Sedation:      Per Anesthesia Care Recommendation:           - Written discharge instructions were provided to                            the patient.                           - Resume previous diet today.                           - Continue present medications.                           - Resume Eliquis (apixaban) at prior dose tomorrow.                           - Dicyclomone 10 mg po tid pern.                           - Await pathology results.                           -  Repeat colonoscopy is recommended. The                            colonoscopy date will be determined after pathology                            results from today's exam become available for                            review. Procedure Code(s):        --- Professional ---                           [phone], Colonoscopy, flexible; with biopsy, single                            or multiple Diagnosis Code(s):        --- Professional ---                           Z12.11, Encounter for screening for malignant                            neoplasm of colon                           Z80.0, Family history of malignant neoplasm of                            digestive organs                           K63.5, Polyp of colon                           K63.89, Other specified diseases of intestine CPT copyright 2019 [employer]. All rights reserved. The codes documented in this report are preliminary and upon coder review may  be revised to meet current compliance requirements. Hildred Laser, MD Hildred Laser, MD 07/02/2019 1:11:02 PM This report has been signed electronically. Number  of Addenda: 0

## 2019-07-02 NOTE — Anesthesia Postprocedure Evaluation (Signed)
Anesthesia Post Note  Patient: Christine Garrett  Procedure(s) Performed: COLONOSCOPY WITH PROPOFOL (N/A ) BIOPSY POLYPECTOMY  Patient location during evaluation: PACU Anesthesia Type: General Level of consciousness: awake and alert and patient cooperative Pain management: satisfactory to patient Vital Signs Assessment: post-procedure vital signs reviewed and stable Respiratory status: spontaneous breathing Cardiovascular status: stable Postop Assessment: no apparent nausea or vomiting     Last Vitals:  Vitals:   07/02/19 1141  BP: (!) 177/90  Pulse: 82  Resp: 17  Temp: 36.9 C  SpO2: 100%    Last Pain:  Vitals:   07/02/19 1141  PainSc: 0-No pain                 Nydia Ytuarte

## 2019-07-05 LAB — SURGICAL PATHOLOGY

## 2019-07-07 ENCOUNTER — Telehealth (INDEPENDENT_AMBULATORY_CARE_PROVIDER_SITE_OTHER): Payer: Self-pay | Admitting: Internal Medicine

## 2019-07-07 NOTE — Telephone Encounter (Signed)
Please let the patient know , that Dr.Rehman tried to call her yesterday. He states that he will be calling her back to go over the results of her results.

## 2019-07-07 NOTE — Telephone Encounter (Signed)
Patient called regarding results from procedure - ph# [phone]

## 2019-09-06 NOTE — Progress Notes (Signed)
HEMATOLOGY/ONCOLOGY CLINIC NOTE  Date of Service: 09/08/2019  Patient Care Team: Neale Burly, MD as PCP - General (Internal Medicine) Alda Berthold, DO as Consulting Physician (Neurology)  CHIEF COMPLAINTS/PURPOSE OF CONSULTATION:  F/u for continued mx of Multiple myeloma  Oncologic History:  64 y.o. female who initially presented with significant weight loss in the context of H. pylori-associated gastritis and alcohol abuse. At the same time, an incidental discovery of a lower thoracic vertebral mass in the context of chronic numbness and weakness in the lower extremities. MRI of the thoracic spine demonstrated a destructive lesion at T8 level and patient underwent surgical treatment. 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 Staging evaluation conducted by our clinic confirmed presence of IgG kappa multiple myeloma. Cytogenetics studies demonstrated normal karyotype, FISH studies are positive for 13q34 & D13S319 loss and no loss of p53. Based on initial information, patient was staged at R-ISS II.   Patient has completed 6 cycles of induction systemic chemotherapy with lenalidomide, bortezomib, and low-dose dexamethasone.  Repeat bone marrow biopsy demonstrated excellent response to treatment and patient subsequently underwent consolidation therapy including melphalan conditioning and autologous stem cell rescue.  Repeat assessment with PET/CT and bone marrow biopsy on 10/13/2017 demonstrates stringent complete response maintenance.  HISTORY OF PRESENTING ILLNESS:   Christine Garrett is a wonderful 64 y.o. female who has been referred to [place] by my colleague Dr Grace Isaac for evaluation and management of Multiple myeloma. The pt reports that she is doing well overall.   The pt has previously been followed by my colleague Dr Grace Isaac and completed 6 cycles of induction with Revlimid, Velcade and dexamethasone. She had a autologous PBSCT on  07/01/17. She is on lenalidomide maintenance currently.   The pt reports that she has had a headache for the past week, noting a constant pounding in the front of her forehead. She denies fevers and is always cold. She is having clear, nasal discharge and seasonal allergies. She notes aspirin has alleviated her headache some.   She is continuing to take Revlimid on 21 day cycles.  Most recent lab results (11/21/17) of CBC and CMP is as follows: all values are WNL.  MMP 11/21/17 is shows no M spike   On review of systems, pt reports headache, moving her bowels well, increasing strength, and denies mouth sores, fevers, night sweats, and any other symptoms.   INTERVAL HISTORY:   Christine Garrett returns today for management and evaluation of her Multiple Myeloma currently in remission. The patient's last visit with [place] was on 06/22/2019. The pt reports that she is doing well overall.  The pt reports that she had a Colonoscopy in January with her GI and he found and resected some pre-cancerous polyps. They plan to do a repeat Colonoscopy in three years. She denies any recent bowel changes and her eating habits have been the same.   Pt denies any new bone pains. Her diarrhea, nausea, and leg swelling has been about the same. She has continued using Eliquis as prescribed. Pt would like to wait before getting the COVID19 vaccine.   She was trying to avoid stepping on her daughter's dog and fell on her left side. She did not hurt herself anywhere else and iced the area immediately after. Pt notes that the pain is improving. She denies any chest pain or SOB.  Lab results today (09/08/19) of CBC w/diff and CMP is as follows: all values are WNL except for RBC at  3.45, Hgb at 11.7, HCT at 35.5, MCV at 102.9, Chloride at 112, Glucose at 115, Creatinine at 1.04. 09/08/2019 MMP- no M spike  On review of systems, pt reports diarrhea, nausea, leg swelling, bruising/soreness along left chest wall and denies bowel  habit changes, new bone pain, pain at port site, chest pain, SOB, abdominal pain, urinary habit changes and any other symptoms.   MEDICAL HISTORY:  Past Medical History:  Diagnosis Date  . Arthritis    knees  . Cancer ([place])   . Carpal tunnel syndrome   . CHF (congestive heart failure) ([place])   . Diabetes mellitus    type 2  . GERD (gastroesophageal reflux disease)   . Heart murmur    "Little, No concerns" per Dr  Dannielle Burn pt reported.  . Hypertension    controlled using a guided approch with plasma renin activity  . Multiple myeloma not having achieved remission ([place]) 01/06/2017  . Neuropathy   . Peripheral vascular disease ([place])   . Sleep apnea    Uses CPAP    SURGICAL HISTORY: Past Surgical History:  Procedure Laterality Date  . BIOPSY  11/28/2016   Procedure: BIOPSY;  Surgeon: Rogene Houston, MD;  [facility];  Service: Endoscopy;;  gastric  . BIOPSY  07/02/2019   Procedure: BIOPSY;  Surgeon: Rogene Houston, MD;  [facility];  Service: Endoscopy;;  . [place]  . CERVICAL CONE BIOPSY     cervical lesion  . COLONOSCOPY WITH PROPOFOL N/A 07/02/2019   Procedure: COLONOSCOPY WITH PROPOFOL;  Surgeon: Rogene Houston, MD;  [facility];  Service: Endoscopy;  Laterality: N/A;  12:40 - pt can't come earlier due to transportation  . ESOPHAGOGASTRODUODENOSCOPY (EGD) WITH PROPOFOL N/A 11/28/2016   Procedure: ESOPHAGOGASTRODUODENOSCOPY (EGD) WITH PROPOFOL;  Surgeon: Rogene Houston, MD;  [facility];  Service: Endoscopy;  Laterality: N/A;  9:25  . IR FLUORO GUIDE PORT INSERTION RIGHT  01/29/2017  . IR US GUIDE VASC ACCESS RIGHT  01/29/2017  . lipoma removal     right shoulder 2001  . MULTIPLE EXTRACTIONS WITH ALVEOLOPLASTY Bilateral 08/24/2012   Procedure: MULTIPLE EXTRACTION WITH ALVEOLOPLASTY BIOPSY OF RIGHT AND LEFT MANDIBLE ;  Surgeon: Gae Bon, DDS;  Location: [facility];  Service: Oral Surgery;  Laterality: Bilateral;  .  POLYPECTOMY  07/02/2019   Procedure: POLYPECTOMY;  Surgeon: Rogene Houston, MD;  [facility];  Service: Endoscopy;;  . POSTERIOR LUMBAR FUSION 4 LEVEL N/A 12/26/2016   Procedure: THORACIC EIGHT TUMOR RESECTION, THORACIC SIX- THORACIC TEN POSTERIOR SPINAL FUSION;  Surgeon: Ashok Pall, MD;  Location: [facility];  Service: Neurosurgery;  Laterality: N/A;  THORACIC 8 TUMOR RESECTION, THORACIC 6- THORACIC 10 POSTERIOR SPINAL FUSION  . ROTATOR CUFF REPAIR     right shoulder  . TUBAL LIGATION      SOCIAL HISTORY: Social History   Socioeconomic History  . Marital status: Single    Spouse name: Not on file  . Number of children: 3  . Years of education: Not on file  . Highest education level: Not on file  Occupational History  . Occupation: on disability  . Occupation: former Quarry manager  Tobacco Use  . Smoking status: Former Smoker    Packs/day: 0.50    Years: 6.00    Pack years: 3.00    Types: Cigarettes    Quit date: 11/23/2011    Years since quitting: 7.7  . Smokeless tobacco: Never  Used  . Tobacco comment: quit summer 2013  Substance and Sexual Activity  . Alcohol use: No    Alcohol/week: 6.0 standard drinks    Types: 6 Cans of beer per week  . Drug use: No  . Sexual activity: Not Currently    Birth control/protection: Post-menopausal  Other Topics Concern  . Not on file  Social History Narrative   Lives with son, daughter and grandkids in a 2 story home but stays on the first floor.  Has 3 children.  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      Right handed   Social Determinants of Health   Financial Resource Strain:   . Difficulty of Paying Living Expenses:   Food Insecurity:   . Worried About Charity fundraiser in the Last Year:   . Arboriculturist in the Last Year:   Transportation Needs:   . Film/video editor (Medical):   Marland Kitchen Lack of Transportation (Non-Medical):   Physical Activity:   . Days of Exercise per Week:   . Minutes of  Exercise per Session:   Stress:   . Feeling of Stress :   Social Connections:   . Frequency of Communication with Friends and Family:   . Frequency of Social Gatherings with Friends and Family:   . Attends Religious Services:   . Active Member of Clubs or Organizations:   . Attends Archivist Meetings:   Marland Kitchen Marital Status:   Intimate Partner Violence:   . Fear of Current or Ex-Partner:   . Emotionally Abused:   Marland Kitchen Physically Abused:   . Sexually Abused:     FAMILY HISTORY: Family History  Problem Relation Age of Onset  . Diabetes Mother   . Hypertension Mother   . Heart disease Mother   . Alzheimer's disease Mother   . Diabetes Father   . Heart disease Sister   . Diabetes Sister     ALLERGIES:  is allergic to hydrocodone.  MEDICATIONS:  Current Outpatient Medications  Medication Sig Dispense Refill  . apixaban (ELIQUIS) 5 MG TABS tablet Take 1 tablet (5 mg total) by mouth 2 (two) times daily.    Marland Kitchen atorvastatin (LIPITOR) 40 MG tablet Take 40 mg by mouth daily.    Marland Kitchen dicyclomine (BENTYL) 10 MG capsule Take 1 capsule (10 mg total) by mouth 3 (three) times daily as needed for spasms. 60 capsule 1  . enalapril (VASOTEC) 20 MG tablet Take 20 mg by mouth 2 (two) times daily.     Marland Kitchen gabapentin (NEURONTIN) 600 MG tablet Take 600 mg by mouth 3 (three) times daily.    . insulin degludec (TRESIBA FLEXTOUCH) 100 UNIT/ML SOPN FlexTouch Pen Inject 0.5 mLs (50 Units total) into the skin daily at 10 pm. 5 pen 2  . Insulin Pen Needle (B-D ULTRAFINE III SHORT PEN) 31G X 8 MM MISC 1 each by Does not apply route as directed. 100 each 3  . metFORMIN (GLUCOPHAGE) 1000 MG tablet Take 1,000 mg by mouth 2 (two) times daily.     . metoprolol succinate (TOPROL-XL) 50 MG 24 hr tablet Take 50 mg by mouth daily. Take with or immediately following a meal.    . potassium chloride (MICRO-K) 10 MEQ CR capsule Take 10 mEq by mouth daily.    Marland Kitchen REVLIMID 5 MG capsule TAKE 1 CAPSULE (5MG) BY  MOUTH DAILY  FOR 21 DAYS ON, THEN 7 DAYS OFF (Patient taking differently:  Take 5 mg by mouth See admin instructions. TAKE 1 CAPSULE (5MG) BY  MOUTH DAILY FOR 21 DAYS ON, THEN 7 DAYS OFF) 21 capsule 0  . Vitamin D, Ergocalciferol, (DRISDOL) 50000 units CAPS capsule Take 50,000 Units by mouth every Monday.      No current facility-administered medications for this visit.    REVIEW OF SYSTEMS:   A 10+ POINT REVIEW OF SYSTEMS WAS OBTAINED including neurology, dermatology, psychiatry, cardiac, respiratory, lymph, extremities, GI, GU, Musculoskeletal, constitutional, breasts, reproductive, HEENT.  All pertinent positives are noted in the HPI.  All others are negative.   PHYSICAL EXAMINATION: ECOG PERFORMANCE STATUS: 1 - Symptomatic but completely ambulatory   Vitals:   09/08/19 0937  BP: (!) 168/77  Pulse: 74  Resp: 18  Temp: 98.3 F (36.8 C)  SpO2: 100%   Filed Weights   09/08/19 0937  Weight: 237 lb 14.4 oz (107.9 kg)   Body mass index is 35.13 kg/m.  GENERAL:alert, in no acute distress and comfortable, tenderness and ecchymosis along left chest wall - no hematoma observed  SKIN: no acute rashes, no significant lesions EYES: conjunctiva are pink and non-injected, sclera anicteric OROPHARYNX: MMM, no exudates, no oropharyngeal erythema or ulceration NECK: supple, no JVD LYMPH:  no palpable lymphadenopathy in the cervical, axillary or inguinal regions LUNGS: clear to auscultation b/l with normal respiratory effort HEART: regular rate & rhythm ABDOMEN:  normoactive bowel sounds , non tender, not distended. No palpable hepatosplenomegaly.  Extremity: no pedal edema PSYCH: alert & oriented x 3 with fluent speech NEURO: no focal motor/sensory deficits  LABORATORY DATA:  I have reviewed the data as listed  . CBC Latest Ref Rng & Units 09/08/2019 06/22/2019 04/21/2019  WBC 4.0 - 10.5 K/uL 5.9 6.8 5.9  Hemoglobin 12.0 - 15.0 g/dL 11.7(L) 11.8(L) 12.1  Hematocrit 36.0 - 46.0 % 35.5(L) 34.9(L) 36.5    Platelets 150 - 400 K/uL 226 240 247    . CMP Latest Ref Rng & Units 09/08/2019 06/22/2019 04/21/2019  Glucose 70 - 99 mg/dL 115(H) 86 82  BUN 8 - 23 mg/dL '14 13 14  ' Creatinine 0.44 - 1.00 mg/dL 1.04(H) 1.06(H) 1.22(H)  Sodium 135 - 145 mmol/L 143 142 143  Potassium 3.5 - 5.1 mmol/L 4.3 4.1 4.3  Chloride 98 - 111 mmol/L 112(H) 110 112(H)  CO2 22 - 32 mmol/L '24 22 23  ' Calcium 8.9 - 10.3 mg/dL 9.4 9.5 9.5  Total Protein 6.5 - 8.1 g/dL 7.2 7.3 7.5  Total Bilirubin 0.3 - 1.2 mg/dL 0.3 0.5 0.6  Alkaline Phos 38 - 126 U/L 76 85 66  AST 15 - 41 U/L 16 19 12(L)  ALT 0 - 44 U/L '15 17 11   ' Component     Latest Ref Rng & Units 02/19/2019  IgG (Immunoglobin G), Serum     586 - 1,602 mg/dL 1,246  IgA     87 - 352 mg/dL 267  IgM (Immunoglobulin M), Srm     26 - 217 mg/dL 45  Total Protein ELP     6.0 - 8.5 g/dL 6.9  Albumin SerPl Elph-Mcnc     2.9 - 4.4 g/dL 3.6  Alpha 1     0.0 - 0.4 g/dL 0.2  Alpha2 Glob SerPl Elph-Mcnc     0.4 - 1.0 g/dL 0.8  B-Globulin SerPl Elph-Mcnc     0.7 - 1.3 g/dL 1.1  Gamma Glob SerPl Elph-Mcnc     0.4 - 1.8 g/dL 1.3  M Protein SerPl Elph-Mcnc  Not Observed g/dL Not Observed  Globulin, Total     2.2 - 3.9 g/dL 3.3  Albumin/Glob SerPl     0.7 - 1.7 1.1  IFE 1      Comment  Please Note (HCV):      Comment   03/25/18 SPEP and SFLC:     04/21/17 Cytogenetics:    01/17/17 Cytogenetics:    RADIOGRAPHIC STUDIES: I have personally reviewed the radiological images as listed and agreed with the findings in the report. No results found.  10/13/17 PET/CT     ASSESSMENT & PLAN:  64 y.o. female with  1. Multiple myeloma Currently in remission Autologous PBSCT on 07/01/17. IgG kappa multiple myeloma. Cytogenetics studies demonstrated normal karyotype, FISH studies are positive for 13q34 & D13S319 loss and no loss of p53. Based on initial information, patient was staged at R-ISS II.  Completed 6 cycles of induction with Revlimid, Velcade and  dexamethasone  12/09/2018 lumbar spine xray with results revealing "No acute osseous abnormality. Extensive multilevel degenerative changes are noted throughout the lumbar spine."  12/09/2018 sacrum and coccyx xray with results revealing "No acute osseous abnormality. Degenerative changes are noted of both hips."  2. Grade 2 neuropathy -controlled  02/26/18 NCV with EMG which revealed The electrophysiologic findings are most consistent with a chronic, length-dependent sensorimotor axonal and demyelinating polyneuropathy affecting the right side. Overall, these findings are moderate to severe in degree electrically. Incidentally, there is a right Martin-Gruber anastomosis, a normal variant.   3. Rt LE calf veins - possible clot ?chronic.  01/29/18 VAS [place] BLE which revealed Right: Ultrasound is unable to distinguish whether obstruction in the Peroneal veins, and great saphenous vein, and superficial veins/varciosities is acute or chronic. No cystic structure found in the popliteal fossa. Left: no evidence of DVT.    PLAN: -Discussed pt labwork today, 09/08/19; all values are WNL except for RBC at 3.45, Hgb at 11.7, HCT at 35.5, MCV at 102.9, Chloride at 112, Glucose at 115, Creatinine at 1.04. -Discussed 09/08/2019 MMP - no M spike, last MMP shows pt still in remission -No clinical or laboratory evidence of Multiple Myeloma progression at this time -No prohibitive toxicities from Revlimid - continue 5 mg of maintenance Revlimid  -Discussed the CRAB criteria: no hypercalcemia, no renal dysfunction, anemia is stable, no overt bone lesions -Recommend pt receive the COVID19 vaccine as soon as possible. Pt is not ready to receive the vaccine at this time.  -Recommend ice and OTC Voltaren if needed for left chest wall bruising/soreness -Recommend pt stay up to date with age-appropriate cancer screenings.  -Continue Zometa q12 weeks. No dental issues -Continue 5 mg Eliquis BID -Will see back in 8  weeks with labs    FOLLOW UP: RTC with Dr Irene Limbo as per scheduled appointments on 12/02/2019  The total time spent in the appt was 30 minutes and more than 50% was on counseling and direct patient cares.  All of the patient's questions were answered with apparent satisfaction. The patient knows to call the clinic with any problems, questions or concerns.    Sullivan Lone MD [place] AAHIVMS [facility] Hematology/Oncology Physician [facility]  (Office):       [phone] (Work cell):  [phone] (Fax):           [phone]  09/08/2019 10:23 AM  I, Yevette Edwards, am acting as a scribe for Dr. Sullivan Lone.   .I have reviewed the above documentation for accuracy and completeness, and I agree with  the above. Brunetta Genera MD

## 2019-09-08 ENCOUNTER — Inpatient Hospital Stay: Payer: Medicare Other | Attending: Hematology

## 2019-09-08 ENCOUNTER — Inpatient Hospital Stay: Payer: Medicare Other

## 2019-09-08 ENCOUNTER — Inpatient Hospital Stay (HOSPITAL_BASED_OUTPATIENT_CLINIC_OR_DEPARTMENT_OTHER): Payer: Medicare Other | Admitting: Hematology

## 2019-09-08 ENCOUNTER — Other Ambulatory Visit: Payer: Self-pay

## 2019-09-08 VITALS — BP 168/77 | HR 74 | Temp 98.3°F | Resp 18 | Ht 69.0 in | Wt 237.9 lb

## 2019-09-08 DIAGNOSIS — C9001 Multiple myeloma in remission: Secondary | ICD-10-CM

## 2019-09-08 DIAGNOSIS — Z95828 Presence of other vascular implants and grafts: Secondary | ICD-10-CM

## 2019-09-08 LAB — CBC WITH DIFFERENTIAL/PLATELET
Abs Immature Granulocytes: 0.02 10*3/uL (ref 0.00–0.07)
Basophils Absolute: 0 10*3/uL (ref 0.0–0.1)
Basophils Relative: 1 %
Eosinophils Absolute: 0.1 10*3/uL (ref 0.0–0.5)
Eosinophils Relative: 2 %
HCT: 35.5 % — ABNORMAL LOW (ref 36.0–46.0)
Hemoglobin: 11.7 g/dL — ABNORMAL LOW (ref 12.0–15.0)
Immature Granulocytes: 0 %
Lymphocytes Relative: 39 %
Lymphs Abs: 2.3 10*3/uL (ref 0.7–4.0)
MCH: 33.9 pg (ref 26.0–34.0)
MCHC: 33 g/dL (ref 30.0–36.0)
MCV: 102.9 fL — ABNORMAL HIGH (ref 80.0–100.0)
Monocytes Absolute: 0.4 10*3/uL (ref 0.1–1.0)
Monocytes Relative: 6 %
Neutro Abs: 3.1 10*3/uL (ref 1.7–7.7)
Neutrophils Relative %: 52 %
Platelets: 226 10*3/uL (ref 150–400)
RBC: 3.45 MIL/uL — ABNORMAL LOW (ref 3.87–5.11)
RDW: 13.5 % (ref 11.5–15.5)
WBC: 5.9 10*3/uL (ref 4.0–10.5)
nRBC: 0 % (ref 0.0–0.2)

## 2019-09-08 LAB — CMP (CANCER CENTER ONLY)
ALT: 15 U/L (ref 0–44)
AST: 16 U/L (ref 15–41)
Albumin: 3.6 g/dL (ref 3.5–5.0)
Alkaline Phosphatase: 76 U/L (ref 38–126)
Anion gap: 7 (ref 5–15)
BUN: 14 mg/dL (ref 8–23)
CO2: 24 mmol/L (ref 22–32)
Calcium: 9.4 mg/dL (ref 8.9–10.3)
Chloride: 112 mmol/L — ABNORMAL HIGH (ref 98–111)
Creatinine: 1.04 mg/dL — ABNORMAL HIGH (ref 0.44–1.00)
GFR, Est AFR Am: >60 mL/min (ref >60)
GFR, Estimated: 58 mL/min — ABNORMAL LOW (ref >60)
Glucose, Bld: 115 mg/dL — ABNORMAL HIGH (ref 70–99)
Potassium: 4.3 mmol/L (ref 3.5–5.1)
Sodium: 143 mmol/L (ref 135–145)
Total Bilirubin: 0.3 mg/dL (ref 0.3–1.2)
Total Protein: 7.2 g/dL (ref 6.5–8.1)

## 2019-09-08 MED ORDER — HEPARIN SOD (PORK) LOCK FLUSH 100 UNIT/ML IV SOLN
500.0000 [IU] | Freq: Once | INTRAVENOUS | Status: AC
  Administered 2019-09-08: 500 [IU] via INTRAVENOUS
  Filled 2019-09-08: qty 5

## 2019-09-08 MED ORDER — SODIUM CHLORIDE 0.9% FLUSH
10.0000 mL | INTRAVENOUS | Status: DC | PRN
  Administered 2019-09-08: 10 mL via INTRAVENOUS
  Filled 2019-09-08: qty 10

## 2019-09-08 MED ORDER — SODIUM CHLORIDE 0.9 % IV SOLN
INTRAVENOUS | Status: DC
  Filled 2019-09-08: qty 250

## 2019-09-08 MED ORDER — ZOLEDRONIC ACID 4 MG/100ML IV SOLN
4.0000 mg | Freq: Once | INTRAVENOUS | Status: AC
  Administered 2019-09-08: 4 mg via INTRAVENOUS

## 2019-09-08 NOTE — Patient Instructions (Signed)
Zoledronic Acid injection (Hypercalcemia, Oncology) What is this medicine? ZOLEDRONIC ACID (ZOE le dron ik AS id) lowers the amount of calcium loss from bone. It is used to treat too much calcium in your blood from cancer. It is also used to prevent complications of cancer that has spread to the bone. This medicine may be used for other purposes; ask your health care provider or pharmacist if you have questions. COMMON BRAND NAME(S): Zometa What should I tell my health care provider before I take this medicine? They need to know if you have any of these conditions:  aspirin-sensitive asthma  cancer, especially if you are receiving medicines used to treat cancer  dental disease or wear dentures  infection  kidney disease  receiving corticosteroids like dexamethasone or prednisone  an unusual or allergic reaction to zoledronic acid, other medicines, foods, dyes, or preservatives  pregnant or trying to get pregnant  breast-feeding How should I use this medicine? This medicine is for infusion into a vein. It is given by a health care professional in a hospital or clinic setting. Talk to your pediatrician regarding the use of this medicine in children. Special care may be needed. Overdosage: If you think you have taken too much of this medicine contact a poison control center or emergency room at once. NOTE: This medicine is only for you. Do not share this medicine with others. What if I miss a dose? It is important not to miss your dose. Call your doctor or health care professional if you are unable to keep an appointment. What may interact with this medicine?  certain antibiotics given by injection  NSAIDs, medicines for pain and inflammation, like ibuprofen or naproxen  some diuretics like bumetanide, furosemide  teriparatide  thalidomide This list may not describe all possible interactions. Give your health care provider a list of all the medicines, herbs, non-prescription  drugs, or dietary supplements you use. Also tell them if you smoke, drink alcohol, or use illegal drugs. Some items may interact with your medicine. What should I watch for while using this medicine? Visit your doctor or health care professional for regular checkups. It may be some time before you see the benefit from this medicine. Do not stop taking your medicine unless your doctor tells you to. Your doctor may order blood tests or other tests to see how you are doing. Women should inform their doctor if they wish to become pregnant or think they might be pregnant. There is a potential for serious side effects to an unborn child. Talk to your health care professional or pharmacist for more information. You should make sure that you get enough calcium and vitamin D while you are taking this medicine. Discuss the foods you eat and the vitamins you take with your health care professional. Some people who take this medicine have severe bone, joint, and/or muscle pain. This medicine may also increase your risk for jaw problems or a broken thigh bone. Tell your doctor right away if you have severe pain in your jaw, bones, joints, or muscles. Tell your doctor if you have any pain that does not go away or that gets worse. Tell your dentist and dental surgeon that you are taking this medicine. You should not have major dental surgery while on this medicine. See your dentist to have a dental exam and fix any dental problems before starting this medicine. Take good care of your teeth while on this medicine. Make sure you see your dentist for regular follow-up   appointments. What side effects may I notice from receiving this medicine? Side effects that you should report to your doctor or health care professional as soon as possible:  allergic reactions like skin rash, itching or hives, swelling of the face, lips, or tongue  anxiety, confusion, or depression  breathing problems  changes in vision  eye  pain  feeling faint or lightheaded, falls  jaw pain, especially after dental work  mouth sores  muscle cramps, stiffness, or weakness  redness, blistering, peeling or loosening of the skin, including inside the mouth  trouble passing urine or change in the amount of urine Side effects that usually do not require medical attention (report to your doctor or health care professional if they continue or are bothersome):  bone, joint, or muscle pain  constipation  diarrhea  fever  hair loss  irritation at site where injected  loss of appetite  nausea, vomiting  stomach upset  trouble sleeping  trouble swallowing  weak or tired This list may not describe all possible side effects. Call your doctor for medical advice about side effects. You may report side effects to FDA at 1-800-FDA-1088. Where should I keep my medicine? This drug is given in a hospital or clinic and will not be stored at home. NOTE: This sheet is a summary. It may not cover all possible information. If you have questions about this medicine, talk to your doctor, pharmacist, or health care provider.  2020 [employer] Standard (2013-10-30 14:19:39)  

## 2019-09-13 LAB — MULTIPLE MYELOMA PANEL, SERUM
Albumin SerPl Elph-Mcnc: 3.7 g/dL (ref 2.9–4.4)
Albumin/Glob SerPl: 1.2 (ref 0.7–1.7)
Alpha 1: 0.2 g/dL (ref 0.0–0.4)
Alpha2 Glob SerPl Elph-Mcnc: 0.9 g/dL (ref 0.4–1.0)
B-Globulin SerPl Elph-Mcnc: 1 g/dL (ref 0.7–1.3)
Gamma Glob SerPl Elph-Mcnc: 1.1 g/dL (ref 0.4–1.8)
Globulin, Total: 3.2 g/dL (ref 2.2–3.9)
IgA: 170 mg/dL (ref 87–352)
IgG (Immunoglobin G), Serum: 1102 mg/dL (ref 586–1602)
IgM (Immunoglobulin M), Srm: 34 mg/dL (ref 26–217)
Total Protein ELP: 6.9 g/dL (ref 6.0–8.5)

## 2019-09-29 DIAGNOSIS — I5042 Chronic combined systolic (congestive) and diastolic (congestive) heart failure: Secondary | ICD-10-CM | POA: Diagnosis not present

## 2019-09-29 DIAGNOSIS — E1121 Type 2 diabetes mellitus with diabetic nephropathy: Secondary | ICD-10-CM | POA: Diagnosis not present

## 2019-09-29 DIAGNOSIS — N182 Chronic kidney disease, stage 2 (mild): Secondary | ICD-10-CM | POA: Diagnosis not present

## 2019-09-29 DIAGNOSIS — M545 Low back pain: Secondary | ICD-10-CM | POA: Diagnosis not present

## 2019-09-29 DIAGNOSIS — E7849 Other hyperlipidemia: Secondary | ICD-10-CM | POA: Diagnosis not present

## 2019-09-29 DIAGNOSIS — J449 Chronic obstructive pulmonary disease, unspecified: Secondary | ICD-10-CM | POA: Diagnosis not present

## 2019-09-29 DIAGNOSIS — I10 Essential (primary) hypertension: Secondary | ICD-10-CM | POA: Diagnosis not present

## 2019-10-01 DIAGNOSIS — M47814 Spondylosis without myelopathy or radiculopathy, thoracic region: Secondary | ICD-10-CM | POA: Diagnosis not present

## 2019-10-01 DIAGNOSIS — M419 Scoliosis, unspecified: Secondary | ICD-10-CM | POA: Diagnosis not present

## 2019-10-01 DIAGNOSIS — M545 Low back pain: Secondary | ICD-10-CM | POA: Diagnosis not present

## 2019-10-01 DIAGNOSIS — M546 Pain in thoracic spine: Secondary | ICD-10-CM | POA: Diagnosis not present

## 2019-10-01 DIAGNOSIS — M1611 Unilateral primary osteoarthritis, right hip: Secondary | ICD-10-CM | POA: Diagnosis not present

## 2019-10-01 DIAGNOSIS — Z9889 Other specified postprocedural states: Secondary | ICD-10-CM | POA: Diagnosis not present

## 2019-10-22 DIAGNOSIS — M5126 Other intervertebral disc displacement, lumbar region: Secondary | ICD-10-CM | POA: Diagnosis not present

## 2019-10-22 DIAGNOSIS — M545 Low back pain: Secondary | ICD-10-CM | POA: Diagnosis not present

## 2019-10-22 DIAGNOSIS — M48061 Spinal stenosis, lumbar region without neurogenic claudication: Secondary | ICD-10-CM | POA: Diagnosis not present

## 2019-11-02 DIAGNOSIS — M4807 Spinal stenosis, lumbosacral region: Secondary | ICD-10-CM | POA: Diagnosis not present

## 2019-11-09 DIAGNOSIS — I10 Essential (primary) hypertension: Secondary | ICD-10-CM | POA: Diagnosis not present

## 2019-11-09 DIAGNOSIS — Z6836 Body mass index (BMI) 36.0-36.9, adult: Secondary | ICD-10-CM | POA: Diagnosis not present

## 2019-12-01 ENCOUNTER — Other Ambulatory Visit: Payer: Self-pay | Admitting: *Deleted

## 2019-12-01 DIAGNOSIS — C9001 Multiple myeloma in remission: Secondary | ICD-10-CM

## 2019-12-01 NOTE — Progress Notes (Signed)
HEMATOLOGY/ONCOLOGY CLINIC NOTE  Date of Service: 12/02/2019  Patient Care Team: Neale Burly, MD as PCP - General (Internal Medicine) Alda Berthold, DO as Consulting Physician (Neurology)  CHIEF COMPLAINTS/PURPOSE OF CONSULTATION:  F/u for continued mx of Multiple myeloma  Oncologic History:  64 y.o. female who initially presented with significant weight loss in the context of H. pylori-associated gastritis and alcohol abuse. At the same time, an incidental discovery of a lower thoracic vertebral mass in the context of chronic numbness and weakness in the lower extremities. MRI of the thoracic spine demonstrated a destructive lesion at T8 level and patient underwent surgical treatment. 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 Staging evaluation conducted by our clinic confirmed presence of IgG kappa multiple myeloma. Cytogenetics studies demonstrated normal karyotype, FISH studies are positive for 13q34 & D13S319 loss and no loss of p53. Based on initial information, patient was staged at R-ISS II.   Patient has completed 6 cycles of induction systemic chemotherapy with lenalidomide, bortezomib, and low-dose dexamethasone.  Repeat bone marrow biopsy demonstrated excellent response to treatment and patient subsequently underwent consolidation therapy including melphalan conditioning and autologous stem cell rescue.  Repeat assessment with PET/CT and bone marrow biopsy on 10/13/2017 demonstrates stringent complete response maintenance.  HISTORY OF PRESENTING ILLNESS:   Christine Garrett is a wonderful 64 y.o. female who has been referred to [place] by my colleague Dr Grace Isaac for evaluation and management of Multiple myeloma. The pt reports that she is doing well overall.   The pt has previously been followed by my colleague Dr Grace Isaac and completed 6 cycles of induction with Revlimid, Velcade and dexamethasone. She had a autologous PBSCT on  07/01/17. She is on lenalidomide maintenance currently.   The pt reports that she has had a headache for the past week, noting a constant pounding in the front of her forehead. She denies fevers and is always cold. She is having clear, nasal discharge and seasonal allergies. She notes aspirin has alleviated her headache some.   She is continuing to take Revlimid on 21 day cycles.  Most recent lab results (11/21/17) of CBC and CMP is as follows: all values are WNL.  MMP 11/21/17 is shows no M spike   On review of systems, pt reports headache, moving her bowels well, increasing strength, and denies mouth sores, fevers, night sweats, and any other symptoms.   INTERVAL HISTORY:   Christine Garrett returns today for management and evaluation of her Multiple Myeloma currently in remission. The patient's last visit with [place] was on 09/08/19. The pt reports that she is doing well overall.  The pt reports she is good. Pt had her back looked at and she is going to have a fusion of the lumbar spine. She will be having surgery as with Dr. Rhea Bleacher. Pt has been trying to walk as much as she can.   Lab results today (12/02/19) of CBC w/diff and CMP is as follows: all values are WNL except for RBC at 3.47, Hemoglobin at 11.5, HCT at 34.8, MCV at 100.3, Glucose at 131 12/02/19 of Kappa/Lambda Light Chains-pending 12/02/19 of MMP is as follows: pending  On review of systems, pt reports pedal edema and denies abdominal pain, diarrhea and any other symptoms.   MEDICAL HISTORY:  Past Medical History:  Diagnosis Date  . Arthritis    knees  . Cancer ([place])   . Carpal tunnel syndrome   . CHF (congestive heart failure) ([place])   .  Diabetes mellitus    type 2  . GERD (gastroesophageal reflux disease)   . Heart murmur    "Little, No concerns" per Dr  Dannielle Burn pt reported.  . Hypertension    controlled using a guided approch with plasma renin activity  . Multiple myeloma not having achieved remission ([place]) 01/06/2017  .  Neuropathy   . Peripheral vascular disease ([place])   . Sleep apnea    Uses CPAP    SURGICAL HISTORY: Past Surgical History:  Procedure Laterality Date  . BIOPSY  11/28/2016   Procedure: BIOPSY;  Surgeon: Rogene Houston, MD;  [facility];  Service: Endoscopy;;  gastric  . BIOPSY  07/02/2019   Procedure: BIOPSY;  Surgeon: Rogene Houston, MD;  [facility];  Service: Endoscopy;;  . [place]  . CERVICAL CONE BIOPSY     cervical lesion  . COLONOSCOPY WITH PROPOFOL N/A 07/02/2019   Procedure: COLONOSCOPY WITH PROPOFOL;  Surgeon: Rogene Houston, MD;  [facility];  Service: Endoscopy;  Laterality: N/A;  12:40 - pt can't come earlier due to transportation  . ESOPHAGOGASTRODUODENOSCOPY (EGD) WITH PROPOFOL N/A 11/28/2016   Procedure: ESOPHAGOGASTRODUODENOSCOPY (EGD) WITH PROPOFOL;  Surgeon: Rogene Houston, MD;  [facility];  Service: Endoscopy;  Laterality: N/A;  9:25  . IR FLUORO GUIDE PORT INSERTION RIGHT  01/29/2017  . IR US GUIDE VASC ACCESS RIGHT  01/29/2017  . lipoma removal     right shoulder 2001  . MULTIPLE EXTRACTIONS WITH ALVEOLOPLASTY Bilateral 08/24/2012   Procedure: MULTIPLE EXTRACTION WITH ALVEOLOPLASTY BIOPSY OF RIGHT AND LEFT MANDIBLE ;  Surgeon: Gae Bon, DDS;  Location: [facility];  Service: Oral Surgery;  Laterality: Bilateral;  . POLYPECTOMY  07/02/2019   Procedure: POLYPECTOMY;  Surgeon: Rogene Houston, MD;  [facility];  Service: Endoscopy;;  . POSTERIOR LUMBAR FUSION 4 LEVEL N/A 12/26/2016   Procedure: THORACIC EIGHT TUMOR RESECTION, THORACIC SIX- THORACIC TEN POSTERIOR SPINAL FUSION;  Surgeon: Ashok Pall, MD;  Location: [facility];  Service: Neurosurgery;  Laterality: N/A;  THORACIC 8 TUMOR RESECTION, THORACIC 6- THORACIC 10 POSTERIOR SPINAL FUSION  . ROTATOR CUFF REPAIR     right shoulder  . TUBAL LIGATION      SOCIAL HISTORY: Social History   Socioeconomic History  . Marital status: Single    Spouse  name: Not on file  . Number of children: 3  . Years of education: Not on file  . Highest education level: Not on file  Occupational History  . Occupation: on disability  . Occupation: former Quarry manager  Tobacco Use  . Smoking status: Former Smoker    Packs/day: 0.50    Years: 6.00    Pack years: 3.00    Types: Cigarettes    Quit date: 11/23/2011    Years since quitting: 8.0  . Smokeless tobacco: Never Used  . Tobacco comment: quit summer 2013  Vaping Use  . Vaping Use: Never used  Substance and Sexual Activity  . Alcohol use: No    Alcohol/week: 6.0 standard drinks    Types: 6 Cans of beer per week  . Drug use: No  . Sexual activity: Not Currently    Birth control/protection: Post-menopausal  Other Topics Concern  . Not on file  Social History Narrative   Lives with son, daughter and grandkids in a 2 story home but stays on the first floor.  Has 3 children.  On disability since ~ 2006  for back issues but did work as a Quarry manager.      Right handed   Social Determinants of Health   Financial Resource Strain:   . Difficulty of Paying Living Expenses:   Food Insecurity:   . Worried About Charity fundraiser in the Last Year:   . Arboriculturist in the Last Year:   Transportation Needs:   . Film/video editor (Medical):   Marland Kitchen Lack of Transportation (Non-Medical):   Physical Activity:   . Days of Exercise per Week:   . Minutes of Exercise per Session:   Stress:   . Feeling of Stress :   Social Connections:   . Frequency of Communication with Friends and Family:   . Frequency of Social Gatherings with Friends and Family:   . Attends Religious Services:   . Active Member of Clubs or Organizations:   . Attends Archivist Meetings:   Marland Kitchen Marital Status:   Intimate Partner Violence:   . Fear of Current or Ex-Partner:   . Emotionally Abused:   Marland Kitchen Physically Abused:   . Sexually Abused:     FAMILY HISTORY: Family History  Problem Relation Age of Onset  . Diabetes Mother    . Hypertension Mother   . Heart disease Mother   . Alzheimer's disease Mother   . Diabetes Father   . Heart disease Sister   . Diabetes Sister     ALLERGIES:  is allergic to hydrocodone.  MEDICATIONS:  Current Outpatient Medications  Medication Sig Dispense Refill  . amLODipine (NORVASC) 5 MG tablet Take 5 mg by mouth daily.    Marland Kitchen apixaban (ELIQUIS) 5 MG TABS tablet Take 1 tablet (5 mg total) by mouth 2 (two) times daily.    Marland Kitchen atorvastatin (LIPITOR) 40 MG tablet Take 40 mg by mouth daily.    Marland Kitchen dicyclomine (BENTYL) 10 MG capsule Take 1 capsule (10 mg total) by mouth 3 (three) times daily as needed for spasms. 60 capsule 1  . enalapril (VASOTEC) 20 MG tablet Take 20 mg by mouth 2 (two) times daily.     Marland Kitchen gabapentin (NEURONTIN) 300 MG capsule Take 600 mg by mouth 3 (three) times daily.    . hydrALAZINE (APRESOLINE) 25 MG tablet Take 25 mg by mouth 2 (two) times daily.    . insulin degludec (TRESIBA FLEXTOUCH) 100 UNIT/ML SOPN FlexTouch Pen Inject 0.5 mLs (50 Units total) into the skin daily at 10 pm. 5 pen 2  . Insulin Pen Needle (B-D ULTRAFINE III SHORT PEN) 31G X 8 MM MISC 1 each by Does not apply route as directed. 100 each 3  . metFORMIN (GLUCOPHAGE) 1000 MG tablet Take 1,000 mg by mouth 2 (two) times daily.     . metoprolol succinate (TOPROL-XL) 50 MG 24 hr tablet Take 50 mg by mouth daily. Take with or immediately following a meal.    . potassium chloride (MICRO-K) 10 MEQ CR capsule Take 10 mEq by mouth daily.    Marland Kitchen REVLIMID 5 MG capsule TAKE 1 CAPSULE (5MG) BY  MOUTH DAILY FOR 21 DAYS ON, THEN 7 DAYS OFF (Patient taking differently: Take 5 mg by mouth See admin instructions. TAKE 1 CAPSULE (5MG) BY  MOUTH DAILY FOR 21 DAYS ON, THEN 7 DAYS OFF) 21 capsule 0  . Vitamin D, Ergocalciferol, (DRISDOL) 50000 units CAPS capsule Take 50,000 Units by mouth every Monday.      No current facility-administered medications for this visit.    REVIEW OF SYSTEMS:  A 10+ POINT REVIEW OF SYSTEMS  WAS OBTAINED including neurology, dermatology, psychiatry, cardiac, respiratory, lymph, extremities, GI, GU, Musculoskeletal, constitutional, breasts, reproductive, HEENT.  All pertinent positives are noted in the HPI.  All others are negative.   PHYSICAL EXAMINATION: ECOG PERFORMANCE STATUS: 1 - Symptomatic but completely ambulatory   Vitals:   12/02/19 0958 12/02/19 0959  BP: (!) 192/80 (!) 183/87  Pulse: 78   Resp: 16   Temp: 97.7 F (36.5 C)   SpO2: 100%    Filed Weights   12/02/19 0958  Weight: 241 lb 1.6 oz (109.4 kg)   Body mass index is 35.6 kg/m.   GENERAL:alert, in no acute distress and comfortable SKIN: no acute rashes, no significant lesions EYES: conjunctiva are pink and non-injected, sclera anicteric OROPHARYNX: MMM, no exudates, no oropharyngeal erythema or ulceration NECK: supple, no JVD LYMPH:  no palpable lymphadenopathy in the cervical, axillary or inguinal regions LUNGS: clear to auscultation b/l with normal respiratory effort HEART: regular rate & rhythm ABDOMEN:  normoactive bowel sounds , non tender, not distended. Extremity: trace pedal edema PSYCH: alert & oriented x 3 with fluent speech NEURO: no focal motor/sensory deficits  LABORATORY DATA:  I have reviewed the data as listed  . CBC Latest Ref Rng & Units 12/02/2019 09/08/2019 06/22/2019  WBC 4.0 - 10.5 K/uL 6.3 5.9 6.8  Hemoglobin 12.0 - 15.0 g/dL 11.5(L) 11.7(L) 11.8(L)  Hematocrit 36 - 46 % 34.8(L) 35.5(L) 34.9(L)  Platelets 150 - 400 K/uL 220 226 240    . CMP Latest Ref Rng & Units 12/02/2019 09/08/2019 06/22/2019  Glucose 70 - 99 mg/dL 131(H) 115(H) 86  BUN 8 - 23 mg/dL _0 Creatinine 0.44 - 1.00 mg/dL 0.94 1.04(H) 1.06(H)  Sodium 135 - 145 mmol/L 141 143 142  Potassium 3.5 - 5.1 mmol/L 4.4 4.3 4.1  Chloride 98 - 111 mmol/L 109 112(H) 110  CO2 22 - 32 mmol/L _1 Calcium 8.9 - 10.3 mg/dL 9.6 9.4 9.5  Total Protein 6.5 - 8.1 g/dL 7.2 7.2 7.3  Total Bilirubin 0.3 - 1.2 mg/dL  0.6 0.3 0.5  Alkaline Phos 38 - 126 U/L 68 76 85  AST 15 - 41 U/L _2 ALT 0 - 44 U/L _3 Component     Latest Ref Rng & Units 02/19/2019  IgG (Immunoglobin G), Serum     586 - 1,602 mg/dL 1,246  IgA     87 - 352 mg/dL 267  IgM (Immunoglobulin M), Srm     26 - 217 mg/dL 45  Total Protein ELP     6.0 - 8.5 g/dL 6.9  Albumin SerPl Elph-Mcnc     2.9 - 4.4 g/dL 3.6  Alpha 1     0.0 - 0.4 g/dL 0.2  Alpha2 Glob SerPl Elph-Mcnc     0.4 - 1.0 g/dL 0.8  B-Globulin SerPl Elph-Mcnc     0.7 - 1.3 g/dL 1.1  Gamma Glob SerPl Elph-Mcnc     0.4 - 1.8 g/dL 1.3  M Protein SerPl Elph-Mcnc     Not Observed g/dL Not Observed  Globulin, Total     2.2 - 3.9 g/dL 3.3  Albumin/Glob SerPl     0.7 - 1.7 1.1  IFE 1      Comment  Please Note (HCV):      Comment   03/25/18 SPEP and SFLC:     04/21/17 Cytogenetics:    01/17/17 Cytogenetics:    RADIOGRAPHIC  STUDIES: I have personally reviewed the radiological images as listed and agreed with the findings in the report. No results found.  10/13/17 PET/CT     ASSESSMENT & PLAN:  64 y.o. female with  1. Multiple myeloma Currently in remission Autologous PBSCT on 07/01/17. IgG kappa multiple myeloma. Cytogenetics studies demonstrated normal karyotype, FISH studies are positive for 13q34 & D13S319 loss and no loss of p53. Based on initial information, patient was staged at R-ISS II.  Completed 6 cycles of induction with Revlimid, Velcade and dexamethasone  12/09/2018 lumbar spine xray with results revealing "No acute osseous abnormality. Extensive multilevel degenerative changes are noted throughout the lumbar spine."  12/09/2018 sacrum and coccyx xray with results revealing "No acute osseous abnormality. Degenerative changes are noted of both hips."  2. Grade 2 neuropathy -controlled  02/26/18 NCV with EMG which revealed The electrophysiologic findings are most consistent with a chronic, length-dependent sensorimotor axonal and  demyelinating polyneuropathy affecting the right side. Overall, these findings are moderate to severe in degree electrically. Incidentally, there is a right Martin-Gruber anastomosis, a normal variant.   3. Rt LE calf veins - possible clot ?chronic.  01/29/18 VAS [place] BLE which revealed Right: Ultrasound is unable to distinguish whether obstruction in the Peroneal veins, and great saphenous vein, and superficial veins/varciosities is acute or chronic. No cystic structure found in the popliteal fossa. Left: no evidence of DVT.    PLAN: -Discussed pt labwork today, 12/02/19; of CBC w/diff and CMP is as follows: all values are WNL except for RBC at 3.47, Hemoglobin at 11.5, HCT at 34.8, MCV at 100.3, Glucose at 131 -Discussed 12/02/19 of Kappa/Lambda Light Chains is pending -Discussed 12/02/19 of MMP is as follows: pending -Discussed 09/08/2019 MMP - no M spike, last MMP shows pt still in remission -No clinical or laboratory evidence of Multiple Myeloma progression at this time -No prohibitive toxicities from Revlimid - continue 5 mg of maintenance Revlimid  -Discussed the CRAB criteria: no hypercalcemia, no renal dysfunction, anemia is stable, no overt bone lesions -Recommend pt stay up to date with age-appropriate cancer screenings.  -Advised additional risk factors from being on revlimid can cause blood clots  -Advised preventive blood thinners after surgery  -Advised needing to hold Zometa for surgery -pt needs to inform surgeon she is taking zometa  -Recommends reassessing revlimid after 2 years of being on maintenance  -Recommends holding revlimid at least 1 month prior to surgery  -Recommends holding Zometa  -Will see back in 3 month with labs   FOLLOW UP: Cancel all currently scheduled oncology appointments RTC with Dr Irene Limbo with portflush and labs in 3 months   The total time spent in the appt was 30 minutes and more than 50% was on counseling and direct patient cares.  All of the  patient's questions were answered with apparent satisfaction. The patient knows to call the clinic with any problems, questions or concerns.  Sullivan Lone MD [place] AAHIVMS [facility] Hematology/Oncology Physician [facility]  (Office):       [phone] (Work cell):  [phone] (Fax):           [phone]  12/02/2019 10:22 AM  I, Dawayne Cirri am acting as a scribe for Dr. Sullivan Lone.   .I have reviewed the above documentation for accuracy and completeness, and I agree with the above. Brunetta Genera MD

## 2019-12-02 ENCOUNTER — Telehealth: Payer: Self-pay | Admitting: Hematology

## 2019-12-02 ENCOUNTER — Inpatient Hospital Stay: Payer: Medicare Other | Attending: Hematology

## 2019-12-02 ENCOUNTER — Inpatient Hospital Stay: Payer: Medicare Other

## 2019-12-02 ENCOUNTER — Inpatient Hospital Stay (HOSPITAL_BASED_OUTPATIENT_CLINIC_OR_DEPARTMENT_OTHER): Payer: Medicare Other | Admitting: Hematology

## 2019-12-02 ENCOUNTER — Other Ambulatory Visit: Payer: Self-pay

## 2019-12-02 VITALS — BP 183/87 | HR 78 | Temp 97.7°F | Resp 16 | Wt 241.1 lb

## 2019-12-02 DIAGNOSIS — E559 Vitamin D deficiency, unspecified: Secondary | ICD-10-CM | POA: Diagnosis not present

## 2019-12-02 DIAGNOSIS — C9001 Multiple myeloma in remission: Secondary | ICD-10-CM

## 2019-12-02 DIAGNOSIS — Z87891 Personal history of nicotine dependence: Secondary | ICD-10-CM | POA: Insufficient documentation

## 2019-12-02 DIAGNOSIS — Z95828 Presence of other vascular implants and grafts: Secondary | ICD-10-CM

## 2019-12-02 DIAGNOSIS — R519 Headache, unspecified: Secondary | ICD-10-CM | POA: Diagnosis not present

## 2019-12-02 DIAGNOSIS — D649 Anemia, unspecified: Secondary | ICD-10-CM | POA: Diagnosis not present

## 2019-12-02 LAB — CBC WITH DIFFERENTIAL (CANCER CENTER ONLY)
Abs Immature Granulocytes: 0.03 10*3/uL (ref 0.00–0.07)
Basophils Absolute: 0 10*3/uL (ref 0.0–0.1)
Basophils Relative: 1 %
Eosinophils Absolute: 0.2 10*3/uL (ref 0.0–0.5)
Eosinophils Relative: 3 %
HCT: 34.8 % — ABNORMAL LOW (ref 36.0–46.0)
Hemoglobin: 11.5 g/dL — ABNORMAL LOW (ref 12.0–15.0)
Immature Granulocytes: 1 %
Lymphocytes Relative: 37 %
Lymphs Abs: 2.3 10*3/uL (ref 0.7–4.0)
MCH: 33.1 pg (ref 26.0–34.0)
MCHC: 33 g/dL (ref 30.0–36.0)
MCV: 100.3 fL — ABNORMAL HIGH (ref 80.0–100.0)
Monocytes Absolute: 0.5 10*3/uL (ref 0.1–1.0)
Monocytes Relative: 8 %
Neutro Abs: 3.2 10*3/uL (ref 1.7–7.7)
Neutrophils Relative %: 50 %
Platelet Count: 220 10*3/uL (ref 150–400)
RBC: 3.47 MIL/uL — ABNORMAL LOW (ref 3.87–5.11)
RDW: 13.5 % (ref 11.5–15.5)
WBC Count: 6.3 10*3/uL (ref 4.0–10.5)
nRBC: 0 % (ref 0.0–0.2)

## 2019-12-02 LAB — CMP (CANCER CENTER ONLY)
ALT: 24 U/L (ref 0–44)
AST: 23 U/L (ref 15–41)
Albumin: 3.7 g/dL (ref 3.5–5.0)
Alkaline Phosphatase: 68 U/L (ref 38–126)
Anion gap: 9 (ref 5–15)
BUN: 8 mg/dL (ref 8–23)
CO2: 23 mmol/L (ref 22–32)
Calcium: 9.6 mg/dL (ref 8.9–10.3)
Chloride: 109 mmol/L (ref 98–111)
Creatinine: 0.94 mg/dL (ref 0.44–1.00)
GFR, Est AFR Am: >60 mL/min (ref >60)
GFR, Estimated: >60 mL/min (ref >60)
Glucose, Bld: 131 mg/dL — ABNORMAL HIGH (ref 70–99)
Potassium: 4.4 mmol/L (ref 3.5–5.1)
Sodium: 141 mmol/L (ref 135–145)
Total Bilirubin: 0.6 mg/dL (ref 0.3–1.2)
Total Protein: 7.2 g/dL (ref 6.5–8.1)

## 2019-12-02 MED ORDER — SODIUM CHLORIDE 0.9% FLUSH
3.0000 mL | Freq: Once | INTRAVENOUS | Status: AC | PRN
  Administered 2019-12-02: 10 mL
  Filled 2019-12-02: qty 10

## 2019-12-02 MED ORDER — HEPARIN SOD (PORK) LOCK FLUSH 100 UNIT/ML IV SOLN
250.0000 [IU] | Freq: Once | INTRAVENOUS | Status: AC | PRN
  Administered 2019-12-02: 500 [IU]
  Filled 2019-12-02: qty 5

## 2019-12-02 MED ORDER — SODIUM CHLORIDE 0.9% FLUSH
10.0000 mL | Freq: Once | INTRAVENOUS | Status: AC
  Administered 2019-12-02: 10 mL
  Filled 2019-12-02: qty 10

## 2019-12-02 NOTE — Patient Instructions (Signed)

## 2019-12-02 NOTE — Telephone Encounter (Signed)
Scheduled per 6/17 los. Printed avs and calendar for pt.  

## 2019-12-03 LAB — KAPPA/LAMBDA LIGHT CHAINS
Kappa free light chain: 25.6 mg/L — ABNORMAL HIGH (ref 3.3–19.4)
Kappa, lambda light chain ratio: 1.25 (ref 0.26–1.65)
Lambda free light chains: 20.4 mg/L (ref 5.7–26.3)

## 2019-12-06 LAB — MULTIPLE MYELOMA PANEL, SERUM
Albumin SerPl Elph-Mcnc: 3.4 g/dL (ref 2.9–4.4)
Albumin/Glob SerPl: 1.1 (ref 0.7–1.7)
Alpha 1: 0.2 g/dL (ref 0.0–0.4)
Alpha2 Glob SerPl Elph-Mcnc: 0.8 g/dL (ref 0.4–1.0)
B-Globulin SerPl Elph-Mcnc: 1.1 g/dL (ref 0.7–1.3)
Gamma Glob SerPl Elph-Mcnc: 1.2 g/dL (ref 0.4–1.8)
Globulin, Total: 3.3 g/dL (ref 2.2–3.9)
IgA: 215 mg/dL (ref 87–352)
IgG (Immunoglobin G), Serum: 1141 mg/dL (ref 586–1602)
IgM (Immunoglobulin M), Srm: 33 mg/dL (ref 26–217)
Total Protein ELP: 6.7 g/dL (ref 6.0–8.5)

## 2019-12-24 ENCOUNTER — Other Ambulatory Visit: Payer: Self-pay | Admitting: Neurosurgery

## 2020-01-02 NOTE — Pre-Procedure Instructions (Signed)
[id number] [place], [place] [address] [place] [address] Phone: [phone] Fax: [phone]  [place], [address] [place] [address] Phone: [phone] Fax: [phone]  [facility] ([place], [address] [place] [address] Phone: [phone] Fax: [phone]    Your procedure is scheduled on Wed., January 05, 2020 from 8:30AM-5:13PM  Report to [facility] Entrance "A" at 6:30AM  Call this number if you have problems the morning of surgery:  [phone]   Remember:  Do not eat or drink after midnight on July 20th    Take these medicines the morning of surgery with A SIP OF WATER: AmLODipine (NORVASC)      Atorvastatin (LIPITOR)  Gabapentin (NEURONTIN) HydrALAZINE (APRESOLINE) Metoprolol succinate (TOPROL-XL) Methocarbamol (ROBAXIN)  REVLIMID  If Needed: Dicyclomine (BENTYL)  Follow your surgeon's instructions on when to stop Eliquis.  If no instructions were given by your surgeon then you will need to call the office to get those instructions.    As of today, STOP taking all Aspirin (unless instructed by your doctor) and Other Aspirin containing products, Vitamins, Fish oils, and Herbal medications. Also stop all NSAIDS i.e. Advil, Ibuprofen, Motrin, Aleve, Anaprox, Naproxen, BC, Goody Powders, and all Supplements.   . Do not take MetFORMIN (GLUCOPHAGE) the morning of surgery.  . THE NIGHT BEFORE SURGERY, take _____25______ units of _____Tresiba______insulin.   How to Manage Your Diabetes Before and After Surgery  Why is it important to control my blood sugar before and after surgery? . Improving blood sugar levels before and after surgery helps healing and can limit problems. . A way of improving blood sugar control is eating a healthy diet  by: o  Eating less sugar and carbohydrates o  Increasing activity/exercise o  Talking with your doctor about reaching your blood sugar goals . High blood sugars (greater than 180 mg/dL) can raise your risk of infections and slow your recovery, so you will need to focus on controlling your diabetes during the weeks before surgery. . Make sure that the doctor who takes care of your diabetes knows about your planned surgery including the date and location.  How do I manage my blood sugar before surgery? . Check your blood sugar at least 4 times a day, starting 2 days before surgery, to make sure that the level is not too high or low. o Check your blood sugar the morning of your surgery when you wake up and every 2 hours until you get to the Short Stay unit. . If your blood sugar is less than 70 mg/dL, you will need to treat for low blood sugar: o Do not take insulin. o Treat a low blood sugar (less than 70 mg/dL) with  cup of clear juice (cranberry or apple), 4 glucose tablets, OR glucose gel. Recheck blood sugar in 15 minutes after treatment (to make sure it is greater than 70 mg/dL). If your blood sugar is not greater than 70 mg/dL on recheck, call [phone] o  for further instructions.                                                   Marland Kitchen  If your CBG is greater than 220 mg/dL, inform the staff upon arrival to Short Stay.  . If you are admitted to the hospital after surgery: o Your blood sugar will be checked by the staff and you will probably be given insulin after surgery (instead of oral diabetes medicines) to make sure you have good blood sugar levels. o The goal for blood sugar control after surgery is 80-180 mg/dL.  Reviewed and Endorsed by [facility] Patient Education Committee, August 2015   No Smoking of any kind, Tobacco, or Alcohol products 24 hours prior to your procedure. If you use a Cpap at night, you may bring all equipment for your overnight stay.   Special  instructions:   - Preparing For Surgery  Before surgery, you can play an important role. Because skin is not sterile, your skin needs to be as free of germs as possible. You can reduce the number of germs on your skin by washing with CHG (chlorahexidine gluconate) Soap before surgery.  CHG is an antiseptic cleaner which kills germs and bonds with the skin to continue killing germs even after washing.    Please do not use if you have an allergy to CHG or antibacterial soaps. If your skin becomes reddened/irritated stop using the CHG.  Do not shave (including legs and underarms) for at least 48 hours prior to first CHG shower. It is OK to shave your face.  Please follow these instructions carefully.   1. Shower the NIGHT BEFORE SURGERY and the MORNING OF SURGERY with CHG.   2. If you chose to wash your hair, wash your hair first as usual with your normal shampoo.  3. After you shampoo, rinse your hair and body thoroughly to remove the shampoo.  4. Use CHG as you would any other liquid soap. You can apply CHG directly to the skin and wash gently with a scrungie or a clean washcloth.   5. Apply the CHG Soap to your body ONLY FROM THE NECK DOWN.  Do not use on open wounds or open sores. Avoid contact with your eyes, ears, mouth and genitals (private parts). Wash Face and genitals (private parts)  with your normal soap.  6. Wash thoroughly, paying special attention to the area where your surgery will be performed.  7. Thoroughly rinse your body with warm water from the neck down.  8. DO NOT shower/wash with your normal soap after using and rinsing off the CHG Soap.  9. Pat yourself dry with a CLEAN TOWEL.  10. Wear CLEAN PAJAMAS to bed the night before surgery, wear comfortable clothes the morning of surgery  11. Place CLEAN SHEETS on your bed the night of your first shower and DO NOT SLEEP WITH PETS.   Day of Surgery:            Remember to brush your teeth WITH YOUR REGULAR  TOOTHPASTE.  Do not wear jewelry, make-up or nail polish.  Do not wear lotions, powders, or perfumes, or deodorant.  Do not shave 48 hours prior to surgery.    Do not bring valuables to the hospital.  [facility] is not responsible for any belongings or valuables.  Contacts, dentures or bridgework may not be worn into surgery.   For patients admitted to the hospital, discharge time will be determined by your treatment team.  Patients discharged the day of surgery will not be allowed to drive home, and someone age 45 and over needs to stay with them for 24  hours.  Please wear clean clothes to the hospital/surgery center.    Please read over the following fact sheets that you were given.

## 2020-01-03 ENCOUNTER — Encounter (HOSPITAL_COMMUNITY)
Admission: RE | Admit: 2020-01-03 | Discharge: 2020-01-03 | Disposition: A | Discharge status: Home / Self Care | Payer: Medicare Other | Source: Ambulatory Visit | Attending: Neurosurgery | Admitting: Neurosurgery

## 2020-01-03 ENCOUNTER — Encounter (HOSPITAL_COMMUNITY): Payer: Self-pay

## 2020-01-03 ENCOUNTER — Other Ambulatory Visit (HOSPITAL_COMMUNITY)
Admission: RE | Admit: 2020-01-03 | Discharge: 2020-01-03 | Disposition: A | Discharge status: Home / Self Care | Payer: Medicare Other | Source: Ambulatory Visit | Attending: Neurosurgery | Admitting: Neurosurgery

## 2020-01-03 ENCOUNTER — Other Ambulatory Visit: Payer: Self-pay

## 2020-01-03 DIAGNOSIS — I2510 Atherosclerotic heart disease of native coronary artery without angina pectoris: Secondary | ICD-10-CM | POA: Insufficient documentation

## 2020-01-03 DIAGNOSIS — Z7901 Long term (current) use of anticoagulants: Secondary | ICD-10-CM | POA: Insufficient documentation

## 2020-01-03 DIAGNOSIS — Z01812 Encounter for preprocedural laboratory examination: Secondary | ICD-10-CM | POA: Insufficient documentation

## 2020-01-03 DIAGNOSIS — G4733 Obstructive sleep apnea (adult) (pediatric): Secondary | ICD-10-CM | POA: Insufficient documentation

## 2020-01-03 DIAGNOSIS — Z79899 Other long term (current) drug therapy: Secondary | ICD-10-CM | POA: Insufficient documentation

## 2020-01-03 DIAGNOSIS — E118 Type 2 diabetes mellitus with unspecified complications: Secondary | ICD-10-CM | POA: Insufficient documentation

## 2020-01-03 DIAGNOSIS — M4807 Spinal stenosis, lumbosacral region: Secondary | ICD-10-CM | POA: Diagnosis not present

## 2020-01-03 DIAGNOSIS — I10 Essential (primary) hypertension: Secondary | ICD-10-CM | POA: Insufficient documentation

## 2020-01-03 DIAGNOSIS — Z86711 Personal history of pulmonary embolism: Secondary | ICD-10-CM | POA: Diagnosis not present

## 2020-01-03 LAB — BASIC METABOLIC PANEL
Anion gap: 9 (ref 5–15)
BUN: 8 mg/dL (ref 8–23)
CO2: 23 mmol/L (ref 22–32)
Calcium: 9.5 mg/dL (ref 8.9–10.3)
Chloride: 108 mmol/L (ref 98–111)
Creatinine, Ser: 1.13 mg/dL — ABNORMAL HIGH (ref 0.44–1.00)
GFR calc Af Amer: >60 mL/min (ref >60)
GFR calc non Af Amer: 52 mL/min — ABNORMAL LOW (ref >60)
Glucose, Bld: 120 mg/dL — ABNORMAL HIGH (ref 70–99)
Potassium: 3.9 mmol/L (ref 3.5–5.1)
Sodium: 140 mmol/L (ref 135–145)

## 2020-01-03 LAB — CBC
HCT: 39.1 % (ref 36.0–46.0)
Hemoglobin: 13 g/dL (ref 12.0–15.0)
MCH: 34.7 pg — ABNORMAL HIGH (ref 26.0–34.0)
MCHC: 33.2 g/dL (ref 30.0–36.0)
MCV: 104.3 fL — ABNORMAL HIGH (ref 80.0–100.0)
Platelets: 249 10*3/uL (ref 150–400)
RBC: 3.75 MIL/uL — ABNORMAL LOW (ref 3.87–5.11)
RDW: 13.4 % (ref 11.5–15.5)
WBC: 7.1 10*3/uL (ref 4.0–10.5)
nRBC: 0 % (ref 0.0–0.2)

## 2020-01-03 LAB — SARS CORONAVIRUS 2 (TAT 6-24 HRS): SARS Coronavirus 2: NEGATIVE

## 2020-01-03 LAB — GLUCOSE, CAPILLARY: Glucose-Capillary: 117 mg/dL — ABNORMAL HIGH (ref 70–99)

## 2020-01-03 LAB — HEMOGLOBIN A1C
Hgb A1c MFr Bld: 6.5 % — ABNORMAL HIGH (ref 4.8–5.6)
Mean Plasma Glucose: 139.85 mg/dL

## 2020-01-03 LAB — SURGICAL PCR SCREEN
MRSA, PCR: NEGATIVE
Staphylococcus aureus: NEGATIVE

## 2020-01-03 NOTE — Progress Notes (Addendum)
PCP - Dr. Stoney Bang Cardiologist - Dr. Bernerd Pho Oncologist - Dr. Brunetta Genera  PPM/ICD - denies  Chest x-ray - N/A EKG - 04/08/2019 - tracing requested Stress Test - per patient done in 2019  ECHO - 04/14/2019 Cardiac Cath - denies  Sleep Study - Yes, patient stated hasn't worn CPAP in about two years due to difficulty breathing with it on   Fasting Blood Sugar - 98 - 140 Checks Blood Sugar  3  times a day  Blood Thinner Instructions: N/A Aspirin Instructions: N/A  ERAS Protcol - No  COVID TEST- Scheduled for today 01/03/2020 after PAT appointment. Patient verbalized understanding of self-quarantine instructions, appointment time and place.  Anesthesia review: YES, cardiac hx - EKG tracing requested Patient elevated BP at PAT visit 193/90 (initial check) and second at end of visit was 189/90. Patient stated BP meds were taken prior to arrival at 0900.  Lower back pain 10/10 per patient that is the reason it is high. Patient stated she has an appointment this afternoon with her PCP in regards to elevated BP's and upcoming procedure.  Patient denies shortness of breath, fever, cough and chest pain at PAT appointment  All instructions explained to the patient, with a verbal understanding of the material. Patient agrees to go over the instructions while at home for a better understanding. Patient also instructed to self quarantine after being tested for COVID-19. The opportunity to ask questions was provided.

## 2020-01-04 NOTE — Anesthesia Preprocedure Evaluation (Addendum)
Anesthesia Evaluation  Patient identified by MRN, date of birth, ID band Patient awake    Reviewed: Allergy & Precautions, NPO status , Patient's Chart, lab work & pertinent test results  Airway Mallampati: I  TM Distance: >3 FB Neck ROM: Full    Dental   Pulmonary sleep apnea , former smoker,    Pulmonary exam normal        Cardiovascular hypertension, Pt. on medications Normal cardiovascular exam     Neuro/Psych    GI/Hepatic GERD  Medicated and Controlled,  Endo/Other  diabetes, Type 2, Insulin Dependent, Oral Hypoglycemic Agents  Renal/GU      Musculoskeletal   Abdominal   Peds  Hematology   Anesthesia Other Findings   Reproductive/Obstetrics                            Anesthesia Physical Anesthesia Plan  ASA: III  Anesthesia Plan: General   Post-op Pain Management:    Induction: Intravenous  PONV Risk Score and Plan: 3 and Ondansetron, Midazolam and Treatment may vary due to age or medical condition  Airway Management Planned: Oral ETT  Additional Equipment: Arterial line  Intra-op Plan:   Post-operative Plan: Possible Post-op intubation/ventilation  Informed Consent: I have reviewed the patients History and Physical, chart, labs and discussed the procedure including the risks, benefits and alternatives for the proposed anesthesia with the patient or authorized representative who has indicated his/her understanding and acceptance.       Plan Discussed with: CRNA and Surgeon  Anesthesia Plan Comments: (See PAT note by Karoline Caldwell, PA-C )       Anesthesia Quick Evaluation

## 2020-01-04 NOTE — Progress Notes (Signed)
Anesthesia Chart Review:  Patient was seen in preadmission clinic 01/03/2020 and noted to have uncontrolled hypertension.  Blood pressure 189/90.  This is followed by her PCP and she was recommended to follow-up with him prior to surgery.  She stated she actually had an appointment at 61 with her PCP Dr. Sherrie Sport to discuss blood pressure management and surgical clearance.  She was subsequently seen by Dr. Sherrie Sport who titrated meds and did clear her for surgery. We received preop clearance note dated 01/03/2020 stating patient is at low risk for surgery and may hold Eliquis for 3 days preop (she is on this for history of PE).  She understands that markedly elevated blood pressure on day of surgery could be cause for cancellation.  Follows with oncology for history of multiple myeloma in remission.  She previously completed 6 cycles of induction with Revlimid, Velcade and dexamethasone. She had an autologous PBSCT on 07/01/17. She is on lenalidomide maintenance currently.  Last seen by Dr. Irene Limbo 12/02/2019 and per note she was doing well with no evidence of multiple myeloma progression at this time.  Discussed that she would have upcoming lumbar fusion.  Follows with cardiology for hx of HFrEF (EF 45-50%), moderate MR, mild-mod AR, nonobstructive CAD (60-70% ostial LCx 04/2018 Angiogram.) Last seen by Dr. Dot Lanes 04/08/19 and per note, she was doing fair with stable SOB with exertion and rare chest discomfort occurring mostly at rest.  No overt evidence of heart failure.  Blood pressure noted to be elevated but she was admittedly out of her metoprolol at that time.  OSA, patient states she cannot tolerate CPAP.  DM2 well-controlled, preop A1c 6.5.  Remainder of preop labs unremarkable.  Discussed medical history with Dr. Therisa Doyne. Advised okay to proceed as planned barring acute status change.  Patient had EKG 04/08/2019 at last cardiology visit, however there was no narrative in Care Everywhere.   The tracing has been requested, if not received patient will need EKG day of surgery.  TTE 04/14/19 (care everywhere): Summary  1. The left ventricle is mildly dilated in size with upper normal wall  thickness.  2. Mildly decreased left ventricular systolic function, ejection fraction  45-50%.  3. Dilated left atrium - mildly dilated.  4. Mitral regurgitation - moderate.  5. Aortic Regurgitation - mild to moderate.  6. Normal right ventricular size and systolic function.  7. Tricuspid regurgitation - mild.  8. Elevated pulmonary artery systolic pressure - at least mild.   Cardiac catheterization 04/2018 [facility]:  LMCA: ok, LAD: ok, LCx: 60-70% ostial which involves the bifurcation of the high OM1 takeoff, RCA: mild distal disease. LVEDP 11mHg    JWynonia MustyMPiedmont Newton HospitalShort Stay Center/Anesthesiology Phone ([phone]/20/2021 3:31 PM

## 2020-01-05 ENCOUNTER — Inpatient Hospital Stay (HOSPITAL_COMMUNITY): Payer: Medicare Other

## 2020-01-05 ENCOUNTER — Inpatient Hospital Stay (HOSPITAL_COMMUNITY): Payer: Medicare Other | Admitting: Physician Assistant

## 2020-01-05 ENCOUNTER — Encounter (HOSPITAL_COMMUNITY)
Admission: RE | Disposition: A | Discharge status: Home / Self Care | Payer: Self-pay | Source: Home / Self Care | Attending: Neurosurgery

## 2020-01-05 ENCOUNTER — Inpatient Hospital Stay (HOSPITAL_COMMUNITY)
Admission: RE | Admit: 2020-01-05 | Discharge: 2020-01-10 | DRG: 460 | Disposition: A | Discharge status: Home Health | Payer: Medicare Other | Attending: Neurosurgery | Admitting: Neurosurgery

## 2020-01-05 ENCOUNTER — Encounter (HOSPITAL_COMMUNITY): Payer: Self-pay | Admitting: Neurosurgery

## 2020-01-05 ENCOUNTER — Other Ambulatory Visit: Payer: Self-pay

## 2020-01-05 DIAGNOSIS — M549 Dorsalgia, unspecified: Secondary | ICD-10-CM | POA: Diagnosis not present

## 2020-01-05 DIAGNOSIS — M4326 Fusion of spine, lumbar region: Secondary | ICD-10-CM | POA: Diagnosis not present

## 2020-01-05 DIAGNOSIS — I110 Hypertensive heart disease with heart failure: Secondary | ICD-10-CM | POA: Diagnosis present

## 2020-01-05 DIAGNOSIS — M170 Bilateral primary osteoarthritis of knee: Secondary | ICD-10-CM | POA: Diagnosis present

## 2020-01-05 DIAGNOSIS — M4316 Spondylolisthesis, lumbar region: Principal | ICD-10-CM | POA: Diagnosis present

## 2020-01-05 DIAGNOSIS — M48062 Spinal stenosis, lumbar region with neurogenic claudication: Secondary | ICD-10-CM | POA: Diagnosis present

## 2020-01-05 DIAGNOSIS — Z885 Allergy status to narcotic agent status: Secondary | ICD-10-CM

## 2020-01-05 DIAGNOSIS — E1151 Type 2 diabetes mellitus with diabetic peripheral angiopathy without gangrene: Secondary | ICD-10-CM | POA: Diagnosis present

## 2020-01-05 DIAGNOSIS — E669 Obesity, unspecified: Secondary | ICD-10-CM | POA: Diagnosis present

## 2020-01-05 DIAGNOSIS — Z20822 Contact with and (suspected) exposure to covid-19: Secondary | ICD-10-CM | POA: Diagnosis present

## 2020-01-05 DIAGNOSIS — M47816 Spondylosis without myelopathy or radiculopathy, lumbar region: Secondary | ICD-10-CM | POA: Diagnosis not present

## 2020-01-05 DIAGNOSIS — C9001 Multiple myeloma in remission: Secondary | ICD-10-CM | POA: Diagnosis present

## 2020-01-05 DIAGNOSIS — Z87891 Personal history of nicotine dependence: Secondary | ICD-10-CM

## 2020-01-05 DIAGNOSIS — Z833 Family history of diabetes mellitus: Secondary | ICD-10-CM | POA: Diagnosis not present

## 2020-01-05 DIAGNOSIS — G5600 Carpal tunnel syndrome, unspecified upper limb: Secondary | ICD-10-CM | POA: Diagnosis present

## 2020-01-05 DIAGNOSIS — R011 Cardiac murmur, unspecified: Secondary | ICD-10-CM | POA: Diagnosis present

## 2020-01-05 DIAGNOSIS — G629 Polyneuropathy, unspecified: Secondary | ICD-10-CM | POA: Diagnosis present

## 2020-01-05 DIAGNOSIS — I509 Heart failure, unspecified: Secondary | ICD-10-CM | POA: Diagnosis present

## 2020-01-05 DIAGNOSIS — Z419 Encounter for procedure for purposes other than remedying health state, unspecified: Secondary | ICD-10-CM

## 2020-01-05 DIAGNOSIS — Z79899 Other long term (current) drug therapy: Secondary | ICD-10-CM

## 2020-01-05 DIAGNOSIS — K219 Gastro-esophageal reflux disease without esophagitis: Secondary | ICD-10-CM | POA: Diagnosis present

## 2020-01-05 DIAGNOSIS — Z8249 Family history of ischemic heart disease and other diseases of the circulatory system: Secondary | ICD-10-CM

## 2020-01-05 DIAGNOSIS — Z7901 Long term (current) use of anticoagulants: Secondary | ICD-10-CM | POA: Diagnosis not present

## 2020-01-05 DIAGNOSIS — Z794 Long term (current) use of insulin: Secondary | ICD-10-CM

## 2020-01-05 DIAGNOSIS — G4730 Sleep apnea, unspecified: Secondary | ICD-10-CM | POA: Diagnosis present

## 2020-01-05 DIAGNOSIS — Z6835 Body mass index (BMI) 35.0-35.9, adult: Secondary | ICD-10-CM

## 2020-01-05 DIAGNOSIS — I10 Essential (primary) hypertension: Secondary | ICD-10-CM | POA: Diagnosis not present

## 2020-01-05 HISTORY — PX: LUMBAR FUSION: SHX111

## 2020-01-05 LAB — POCT I-STAT, CHEM 8
BUN: 8 mg/dL (ref 8–23)
BUN: 7 mg/dL — ABNORMAL LOW (ref 8–23)
BUN: 8 mg/dL (ref 8–23)
Calcium, Ion: 1.24 mmol/L (ref 1.15–1.40)
Calcium, Ion: 1.23 mmol/L (ref 1.15–1.40)
Calcium, Ion: 1.23 mmol/L (ref 1.15–1.40)
Chloride: 110 mmol/L (ref 98–111)
Chloride: 111 mmol/L (ref 98–111)
Chloride: 110 mmol/L (ref 98–111)
Creatinine, Ser: 0.8 mg/dL (ref 0.44–1.00)
Creatinine, Ser: 0.9 mg/dL (ref 0.44–1.00)
Creatinine, Ser: 1.1 mg/dL — ABNORMAL HIGH (ref 0.44–1.00)
Glucose, Bld: 204 mg/dL — ABNORMAL HIGH (ref 70–99)
Glucose, Bld: 189 mg/dL — ABNORMAL HIGH (ref 70–99)
Glucose, Bld: 221 mg/dL — ABNORMAL HIGH (ref 70–99)
HCT: 32 % — ABNORMAL LOW (ref 36.0–46.0)
HCT: 31 % — ABNORMAL LOW (ref 36.0–46.0)
HCT: 29 % — ABNORMAL LOW (ref 36.0–46.0)
Hemoglobin: 10.9 g/dL — ABNORMAL LOW (ref 12.0–15.0)
Hemoglobin: 10.5 g/dL — ABNORMAL LOW (ref 12.0–15.0)
Hemoglobin: 9.9 g/dL — ABNORMAL LOW (ref 12.0–15.0)
Potassium: 3.7 mmol/L (ref 3.5–5.1)
Potassium: 4.4 mmol/L (ref 3.5–5.1)
Potassium: 5 mmol/L (ref 3.5–5.1)
Sodium: 143 mmol/L (ref 135–145)
Sodium: 142 mmol/L (ref 135–145)
Sodium: 141 mmol/L (ref 135–145)
TCO2: 23 mmol/L (ref 22–32)
TCO2: 22 mmol/L (ref 22–32)
TCO2: 24 mmol/L (ref 22–32)

## 2020-01-05 LAB — HEMOGLOBIN A1C
Hgb A1c MFr Bld: 6.4 % — ABNORMAL HIGH (ref 4.8–5.6)
Mean Plasma Glucose: 136.98 mg/dL

## 2020-01-05 LAB — GLUCOSE, CAPILLARY
Glucose-Capillary: 203 mg/dL — ABNORMAL HIGH (ref 70–99)
Glucose-Capillary: 222 mg/dL — ABNORMAL HIGH (ref 70–99)
Glucose-Capillary: 207 mg/dL — ABNORMAL HIGH (ref 70–99)

## 2020-01-05 SURGERY — POSTERIOR LUMBAR FUSION 3 LEVEL
Anesthesia: General | Site: Spine Lumbar

## 2020-01-05 MED ORDER — MIDAZOLAM HCL 5 MG/5ML IJ SOLN
INTRAMUSCULAR | Status: DC | PRN
  Administered 2020-01-05: 2 mg via INTRAVENOUS

## 2020-01-05 MED ORDER — SODIUM CHLORIDE 0.9 % IV SOLN: 250.0000 mL | INTRAVENOUS | Status: DC

## 2020-01-05 MED ORDER — CHLORHEXIDINE GLUCONATE CLOTH 2 % EX PADS: 6.0000 | MEDICATED_PAD | Freq: Once | CUTANEOUS | Status: DC

## 2020-01-05 MED ORDER — PHENYLEPHRINE HCL (PRESSORS) 10 MG/ML IV SOLN
INTRAVENOUS | Status: AC
  Filled 2020-01-05: qty 1

## 2020-01-05 MED ORDER — INSULIN DEGLUDEC 100 UNIT/ML ~~LOC~~ SOPN: 50.0000 [IU] | PEN_INJECTOR | Freq: Every day | SUBCUTANEOUS | Status: DC

## 2020-01-05 MED ORDER — POTASSIUM CHLORIDE CRYS ER 10 MEQ PO TBCR
10.0000 meq | EXTENDED_RELEASE_TABLET | Freq: Every day | ORAL | Status: DC
  Administered 2020-01-05: 10 meq via ORAL
  Filled 2020-01-05: qty 1

## 2020-01-05 MED ORDER — HYDROMORPHONE HCL 1 MG/ML IJ SOLN
1.0000 mg | INTRAMUSCULAR | Status: DC | PRN
  Administered 2020-01-06 – 2020-01-08 (×6): 1 mg via INTRAVENOUS
  Filled 2020-01-05 (×6): qty 1

## 2020-01-05 MED ORDER — METFORMIN HCL 500 MG PO TABS
1000.0000 mg | ORAL_TABLET | Freq: Two times a day (BID) | ORAL | Status: DC
  Administered 2020-01-06 – 2020-01-10 (×8): 1000 mg via ORAL
  Filled 2020-01-05 (×9): qty 2

## 2020-01-05 MED ORDER — VASOPRESSIN 20 UNIT/ML IV SOLN
INTRAVENOUS | Status: AC
  Filled 2020-01-05: qty 1

## 2020-01-05 MED ORDER — ZOLPIDEM TARTRATE 5 MG PO TABS: 5.0000 mg | ORAL_TABLET | Freq: Every evening | ORAL | Status: DC | PRN

## 2020-01-05 MED ORDER — LIDOCAINE-EPINEPHRINE 0.5 %-1:200000 IJ SOLN
INTRAMUSCULAR | Status: AC
  Filled 2020-01-05: qty 1

## 2020-01-05 MED ORDER — ALBUMIN HUMAN 5 % IV SOLN
INTRAVENOUS | Status: DC | PRN
Start: 2020-01-05 — End: 2020-01-05

## 2020-01-05 MED ORDER — MIDAZOLAM HCL 2 MG/2ML IJ SOLN
INTRAMUSCULAR | Status: AC
  Filled 2020-01-05: qty 2

## 2020-01-05 MED ORDER — HYDROMORPHONE HCL 1 MG/ML IJ SOLN
INTRAMUSCULAR | Status: AC
  Filled 2020-01-05: qty 1

## 2020-01-05 MED ORDER — THROMBIN 20000 UNITS EX SOLR
CUTANEOUS | Status: AC
  Filled 2020-01-05: qty 20000

## 2020-01-05 MED ORDER — VASOPRESSIN 20 UNIT/ML IV SOLN
INTRAVENOUS | Status: DC | PRN
Start: 2020-01-05 — End: 2020-01-05
  Administered 2020-01-05: 1 [IU] via INTRAVENOUS
  Administered 2020-01-05 (×2): .5 [IU] via INTRAVENOUS
  Administered 2020-01-05: 1 [IU] via INTRAVENOUS

## 2020-01-05 MED ORDER — ENALAPRIL MALEATE 5 MG PO TABS
20.0000 mg | ORAL_TABLET | Freq: Two times a day (BID) | ORAL | Status: DC
  Administered 2020-01-05 – 2020-01-10 (×11): 20 mg via ORAL
  Filled 2020-01-05 (×11): qty 4

## 2020-01-05 MED ORDER — BISACODYL 5 MG PO TBEC
5.0000 mg | DELAYED_RELEASE_TABLET | Freq: Every day | ORAL | Status: DC | PRN
  Administered 2020-01-09: 5 mg via ORAL
  Filled 2020-01-05: qty 1

## 2020-01-05 MED ORDER — LIDOCAINE-EPINEPHRINE 0.5 %-1:200000 IJ SOLN
INTRAMUSCULAR | Status: DC | PRN
  Administered 2020-01-05: 30 mL

## 2020-01-05 MED ORDER — ACETAMINOPHEN 650 MG RE SUPP: 650.0000 mg | RECTAL | Status: DC | PRN

## 2020-01-05 MED ORDER — ONDANSETRON HCL 4 MG/2ML IJ SOLN: 4.0000 mg | Freq: Four times a day (QID) | INTRAMUSCULAR | Status: DC | PRN

## 2020-01-05 MED ORDER — ROCURONIUM BROMIDE 10 MG/ML (PF) SYRINGE
PREFILLED_SYRINGE | INTRAVENOUS | Status: DC | PRN
  Administered 2020-01-05 (×4): 20 mg via INTRAVENOUS
  Administered 2020-01-05: 80 mg via INTRAVENOUS

## 2020-01-05 MED ORDER — THROMBIN 20000 UNITS EX SOLR
CUTANEOUS | Status: DC | PRN
  Administered 2020-01-05: 20 mL

## 2020-01-05 MED ORDER — INSULIN ASPART 100 UNIT/ML ~~LOC~~ SOLN
0.0000 [IU] | SUBCUTANEOUS | Status: DC
  Administered 2020-01-05 (×2): 7 [IU] via SUBCUTANEOUS
  Administered 2020-01-06: 3 [IU] via SUBCUTANEOUS
  Administered 2020-01-06: 4 [IU] via SUBCUTANEOUS
  Administered 2020-01-06: 7 [IU] via SUBCUTANEOUS
  Administered 2020-01-06: 4 [IU] via SUBCUTANEOUS
  Administered 2020-01-06: 7 [IU] via SUBCUTANEOUS
  Administered 2020-01-07 (×2): 3 [IU] via SUBCUTANEOUS
  Administered 2020-01-07: 4 [IU] via SUBCUTANEOUS
  Administered 2020-01-07 – 2020-01-08 (×4): 3 [IU] via SUBCUTANEOUS
  Administered 2020-01-08: 4 [IU] via SUBCUTANEOUS
  Administered 2020-01-09: 3 [IU] via SUBCUTANEOUS

## 2020-01-05 MED ORDER — HYDROMORPHONE HCL 1 MG/ML IJ SOLN
0.2500 mg | INTRAMUSCULAR | Status: DC | PRN
  Administered 2020-01-05 (×2): 0.5 mg via INTRAVENOUS

## 2020-01-05 MED ORDER — INSULIN ASPART 100 UNIT/ML ~~LOC~~ SOLN
SUBCUTANEOUS | Status: AC
  Filled 2020-01-05: qty 1

## 2020-01-05 MED ORDER — OXYCODONE HCL 5 MG PO TABS
5.0000 mg | ORAL_TABLET | ORAL | Status: DC | PRN
  Administered 2020-01-08 (×2): 5 mg via ORAL
  Filled 2020-01-05 (×2): qty 1

## 2020-01-05 MED ORDER — OXYCODONE HCL 5 MG PO TABS
10.0000 mg | ORAL_TABLET | ORAL | Status: DC | PRN
  Administered 2020-01-08 – 2020-01-10 (×3): 10 mg via ORAL
  Filled 2020-01-05 (×3): qty 2

## 2020-01-05 MED ORDER — OXYCODONE HCL ER 15 MG PO T12A
15.0000 mg | EXTENDED_RELEASE_TABLET | Freq: Two times a day (BID) | ORAL | Status: DC
  Administered 2020-01-05 – 2020-01-10 (×9): 15 mg via ORAL
  Filled 2020-01-05 (×10): qty 1

## 2020-01-05 MED ORDER — POTASSIUM CHLORIDE CRYS ER 10 MEQ PO TBCR
10.0000 meq | EXTENDED_RELEASE_TABLET | Freq: Every day | ORAL | Status: DC
  Administered 2020-01-06 – 2020-01-10 (×5): 10 meq via ORAL
  Filled 2020-01-05 (×5): qty 1

## 2020-01-05 MED ORDER — PHENYLEPHRINE HCL-NACL 10-0.9 MG/250ML-% IV SOLN
INTRAVENOUS | Status: DC | PRN
  Administered 2020-01-05: 150 ug/min via INTRAVENOUS
  Administered 2020-01-05: 100 ug/min via INTRAVENOUS

## 2020-01-05 MED ORDER — PHENYLEPHRINE 40 MCG/ML (10ML) SYRINGE FOR IV PUSH (FOR BLOOD PRESSURE SUPPORT)
PREFILLED_SYRINGE | INTRAVENOUS | Status: AC
  Filled 2020-01-05: qty 10

## 2020-01-05 MED ORDER — FENTANYL CITRATE (PF) 250 MCG/5ML IJ SOLN
INTRAMUSCULAR | Status: AC
  Filled 2020-01-05: qty 5

## 2020-01-05 MED ORDER — PROMETHAZINE HCL 25 MG/ML IJ SOLN
12.5000 mg | Freq: Four times a day (QID) | INTRAMUSCULAR | Status: DC | PRN
  Administered 2020-01-09: 12.5 mg via INTRAVENOUS
  Filled 2020-01-05: qty 1

## 2020-01-05 MED ORDER — CEFAZOLIN SODIUM 1 G IJ SOLR
INTRAMUSCULAR | Status: AC
  Filled 2020-01-05: qty 20

## 2020-01-05 MED ORDER — DEXAMETHASONE SODIUM PHOSPHATE 10 MG/ML IJ SOLN
INTRAMUSCULAR | Status: AC
  Filled 2020-01-05: qty 1

## 2020-01-05 MED ORDER — ACETAMINOPHEN 10 MG/ML IV SOLN
INTRAVENOUS | Status: DC | PRN
Start: 2020-01-05 — End: 2020-01-05
  Administered 2020-01-05: 1000 mg via INTRAVENOUS

## 2020-01-05 MED ORDER — MENTHOL 3 MG MT LOZG: 1.0000 | LOZENGE | OROMUCOSAL | Status: DC | PRN

## 2020-01-05 MED ORDER — KETAMINE HCL 10 MG/ML IJ SOLN
INTRAMUSCULAR | Status: DC | PRN
  Administered 2020-01-05 (×2): 10 mg via INTRAVENOUS
  Administered 2020-01-05: 30 mg via INTRAVENOUS
  Administered 2020-01-05 (×2): 10 mg via INTRAVENOUS

## 2020-01-05 MED ORDER — CHLORHEXIDINE GLUCONATE 0.12 % MT SOLN
15.0000 mL | Freq: Once | OROMUCOSAL | Status: AC
  Administered 2020-01-05: 15 mL via OROMUCOSAL
  Filled 2020-01-05: qty 15

## 2020-01-05 MED ORDER — DIAZEPAM 2 MG PO TABS: 5.0000 mg | ORAL_TABLET | Freq: Four times a day (QID) | ORAL | Status: DC | PRN

## 2020-01-05 MED ORDER — ACETAMINOPHEN 325 MG PO TABS
650.0000 mg | ORAL_TABLET | ORAL | Status: DC | PRN
  Administered 2020-01-07 (×2): 650 mg via ORAL
  Filled 2020-01-05 (×3): qty 2

## 2020-01-05 MED ORDER — FENTANYL CITRATE (PF) 250 MCG/5ML IJ SOLN
INTRAMUSCULAR | Status: DC | PRN
  Administered 2020-01-05 (×4): 50 ug via INTRAVENOUS
  Administered 2020-01-05: 200 ug via INTRAVENOUS

## 2020-01-05 MED ORDER — MEPERIDINE HCL 25 MG/ML IJ SOLN: 6.2500 mg | INTRAMUSCULAR | Status: DC | PRN

## 2020-01-05 MED ORDER — GABAPENTIN 300 MG PO CAPS
600.0000 mg | ORAL_CAPSULE | Freq: Three times a day (TID) | ORAL | Status: DC
  Administered 2020-01-05 – 2020-01-10 (×14): 600 mg via ORAL
  Filled 2020-01-05 (×14): qty 2

## 2020-01-05 MED ORDER — LIDOCAINE 2% (20 MG/ML) 5 ML SYRINGE
INTRAMUSCULAR | Status: DC | PRN
  Administered 2020-01-05: 100 mg via INTRAVENOUS

## 2020-01-05 MED ORDER — PROMETHAZINE HCL 25 MG PO TABS
12.5000 mg | ORAL_TABLET | Freq: Four times a day (QID) | ORAL | Status: DC | PRN
  Administered 2020-01-10: 12.5 mg via ORAL
  Filled 2020-01-05: qty 1

## 2020-01-05 MED ORDER — ROCURONIUM BROMIDE 10 MG/ML (PF) SYRINGE
PREFILLED_SYRINGE | INTRAVENOUS | Status: AC
  Filled 2020-01-05: qty 10

## 2020-01-05 MED ORDER — METOPROLOL SUCCINATE ER 25 MG PO TB24
50.0000 mg | ORAL_TABLET | Freq: Every day | ORAL | Status: DC
  Administered 2020-01-06 – 2020-01-10 (×6): 50 mg via ORAL
  Filled 2020-01-05 (×6): qty 2

## 2020-01-05 MED ORDER — AMLODIPINE BESYLATE 5 MG PO TABS
5.0000 mg | ORAL_TABLET | Freq: Every day | ORAL | Status: DC
  Administered 2020-01-06 – 2020-01-10 (×6): 5 mg via ORAL
  Filled 2020-01-05 (×6): qty 1

## 2020-01-05 MED ORDER — PHENYLEPHRINE 40 MCG/ML (10ML) SYRINGE FOR IV PUSH (FOR BLOOD PRESSURE SUPPORT)
PREFILLED_SYRINGE | INTRAVENOUS | Status: DC | PRN
  Administered 2020-01-05 (×3): 80 ug via INTRAVENOUS
  Administered 2020-01-05: 120 ug via INTRAVENOUS

## 2020-01-05 MED ORDER — HYDRALAZINE HCL 25 MG PO TABS
25.0000 mg | ORAL_TABLET | Freq: Two times a day (BID) | ORAL | Status: DC
  Administered 2020-01-05 – 2020-01-10 (×10): 25 mg via ORAL
  Filled 2020-01-05 (×10): qty 1

## 2020-01-05 MED ORDER — KETAMINE HCL 50 MG/5ML IJ SOSY
PREFILLED_SYRINGE | INTRAMUSCULAR | Status: AC
  Filled 2020-01-05: qty 10

## 2020-01-05 MED ORDER — BUPIVACAINE HCL (PF) 0.5 % IJ SOLN
INTRAMUSCULAR | Status: DC | PRN
  Administered 2020-01-05: 30 mL

## 2020-01-05 MED ORDER — KETOROLAC TROMETHAMINE 15 MG/ML IJ SOLN
15.0000 mg | Freq: Four times a day (QID) | INTRAMUSCULAR | Status: AC
  Administered 2020-01-05 – 2020-01-06 (×4): 15 mg via INTRAVENOUS
  Filled 2020-01-05 (×4): qty 1

## 2020-01-05 MED ORDER — DICYCLOMINE HCL 10 MG PO CAPS: 10.0000 mg | ORAL_CAPSULE | Freq: Three times a day (TID) | ORAL | Status: DC | PRN

## 2020-01-05 MED ORDER — 0.9 % SODIUM CHLORIDE (POUR BTL) OPTIME
TOPICAL | Status: DC | PRN
  Administered 2020-01-05 (×4): 1000 mL

## 2020-01-05 MED ORDER — MAGNESIUM CITRATE PO SOLN: 1.0000 | Freq: Once | ORAL | Status: DC | PRN

## 2020-01-05 MED ORDER — SODIUM CHLORIDE 0.9% FLUSH: 3.0000 mL | INTRAVENOUS | Status: DC | PRN

## 2020-01-05 MED ORDER — SENNOSIDES-DOCUSATE SODIUM 8.6-50 MG PO TABS: 1.0000 | ORAL_TABLET | Freq: Every evening | ORAL | Status: DC | PRN

## 2020-01-05 MED ORDER — DOCUSATE SODIUM 100 MG PO CAPS
100.0000 mg | ORAL_CAPSULE | Freq: Two times a day (BID) | ORAL | Status: DC
  Administered 2020-01-05 – 2020-01-10 (×9): 100 mg via ORAL
  Filled 2020-01-05 (×9): qty 1

## 2020-01-05 MED ORDER — LACTATED RINGERS IV SOLN
INTRAVENOUS | Status: DC | PRN
Start: 2020-01-05 — End: 2020-01-05

## 2020-01-05 MED ORDER — ORAL CARE MOUTH RINSE: 15.0000 mL | Freq: Once | OROMUCOSAL | Status: AC

## 2020-01-05 MED ORDER — SUGAMMADEX SODIUM 200 MG/2ML IV SOLN
INTRAVENOUS | Status: DC | PRN
Start: 2020-01-05 — End: 2020-01-05
  Administered 2020-01-05: 200 mg via INTRAVENOUS

## 2020-01-05 MED ORDER — ONDANSETRON HCL 4 MG/2ML IJ SOLN
INTRAMUSCULAR | Status: DC | PRN
  Administered 2020-01-05: 4 mg via INTRAVENOUS

## 2020-01-05 MED ORDER — CEFAZOLIN SODIUM-DEXTROSE 2-4 GM/100ML-% IV SOLN
2.0000 g | INTRAVENOUS | Status: AC
  Administered 2020-01-05 (×2): 2 g via INTRAVENOUS
  Filled 2020-01-05: qty 100

## 2020-01-05 MED ORDER — BUPIVACAINE HCL (PF) 0.5 % IJ SOLN
INTRAMUSCULAR | Status: AC
  Filled 2020-01-05: qty 30

## 2020-01-05 MED ORDER — PROPOFOL 10 MG/ML IV BOLUS
INTRAVENOUS | Status: DC | PRN
  Administered 2020-01-05: 100 mg via INTRAVENOUS
  Administered 2020-01-05: 30 mg via INTRAVENOUS

## 2020-01-05 MED ORDER — LIDOCAINE 2% (20 MG/ML) 5 ML SYRINGE
INTRAMUSCULAR | Status: AC
  Filled 2020-01-05: qty 5

## 2020-01-05 MED ORDER — LACTATED RINGERS IV SOLN: INTRAVENOUS | Status: DC

## 2020-01-05 MED ORDER — ONDANSETRON HCL 4 MG/2ML IJ SOLN
INTRAMUSCULAR | Status: AC
  Filled 2020-01-05: qty 2

## 2020-01-05 MED ORDER — INSULIN GLARGINE 100 UNIT/ML ~~LOC~~ SOLN
50.0000 [IU] | Freq: Every day | SUBCUTANEOUS | Status: DC
  Administered 2020-01-05 – 2020-01-08 (×4): 50 [IU] via SUBCUTANEOUS
  Filled 2020-01-05 (×6): qty 0.5

## 2020-01-05 MED ORDER — PROPOFOL 10 MG/ML IV BOLUS
INTRAVENOUS | Status: AC
  Filled 2020-01-05: qty 20

## 2020-01-05 MED ORDER — ATORVASTATIN CALCIUM 40 MG PO TABS
40.0000 mg | ORAL_TABLET | Freq: Every day | ORAL | Status: DC
  Administered 2020-01-05 – 2020-01-10 (×6): 40 mg via ORAL
  Filled 2020-01-05 (×6): qty 1

## 2020-01-05 MED ORDER — LACTATED RINGERS IV SOLN: INTRAVENOUS | Status: DC | PRN

## 2020-01-05 MED ORDER — VITAMIN D (ERGOCALCIFEROL) 1.25 MG (50000 UNIT) PO CAPS
50000.0000 [IU] | ORAL_CAPSULE | ORAL | Status: DC
  Administered 2020-01-10: 50000 [IU] via ORAL
  Filled 2020-01-05: qty 1

## 2020-01-05 MED ORDER — DEXMEDETOMIDINE HCL IN NACL 400 MCG/100ML IV SOLN
INTRAVENOUS | Status: DC | PRN
  Administered 2020-01-05: .4 ug/kg/h via INTRAVENOUS

## 2020-01-05 MED ORDER — ONDANSETRON HCL 4 MG/2ML IJ SOLN: 4.0000 mg | Freq: Once | INTRAMUSCULAR | Status: DC | PRN

## 2020-01-05 MED ORDER — ACETAMINOPHEN 10 MG/ML IV SOLN
INTRAVENOUS | Status: AC
  Filled 2020-01-05: qty 100

## 2020-01-05 MED ORDER — POTASSIUM CHLORIDE IN NACL 20-0.9 MEQ/L-% IV SOLN
INTRAVENOUS | Status: DC
  Filled 2020-01-05 (×3): qty 1000

## 2020-01-05 MED ORDER — SODIUM CHLORIDE 0.9% FLUSH
3.0000 mL | Freq: Two times a day (BID) | INTRAVENOUS | Status: DC
  Administered 2020-01-06 – 2020-01-10 (×7): 3 mL via INTRAVENOUS

## 2020-01-05 MED ORDER — PHENOL 1.4 % MT LIQD: 1.0000 | OROMUCOSAL | Status: DC | PRN

## 2020-01-05 MED ORDER — ONDANSETRON HCL 4 MG PO TABS: 4.0000 mg | ORAL_TABLET | Freq: Four times a day (QID) | ORAL | Status: DC | PRN

## 2020-01-05 MED ORDER — DEXAMETHASONE SODIUM PHOSPHATE 10 MG/ML IJ SOLN
INTRAMUSCULAR | Status: DC | PRN
  Administered 2020-01-05: 4 mg via INTRAVENOUS

## 2020-01-05 SURGICAL SUPPLY — 64 items
BENZOIN TINCTURE PRP APPL 2/3 (GAUZE/BANDAGES/DRESSINGS) IMPLANT
BIT DRILL PLIF MAS DISP 5.5MM (DRILL) ×1 IMPLANT
BLADE CLIPPER SURG (BLADE) IMPLANT
BUR MATCHSTICK NEURO 3.0 LAGG (BURR) ×3 IMPLANT
BUR PRECISION FLUTE 5.0 (BURR) ×3 IMPLANT
CAGE POST IBF 11X8D 26/9 (Cage) ×6 IMPLANT
CAGE POST IBF 12X8D 26/9 (Cage) ×12 IMPLANT
CANISTER SUCT 3000ML PPV (MISCELLANEOUS) ×3 IMPLANT
CAP RELINE MOD TULIP RMM (Cap) ×24 IMPLANT
CARTRIDGE OIL MAESTRO DRILL (MISCELLANEOUS) ×1 IMPLANT
CLOSURE WOUND 1/2 X4 (GAUZE/BANDAGES/DRESSINGS)
CNTNR URN SCR LID CUP LEK RST (MISCELLANEOUS) ×1 IMPLANT
CONT SPEC 4OZ STRL OR WHT (MISCELLANEOUS) ×2
COVER BACK TABLE 60X90IN (DRAPES) IMPLANT
COVER WAND RF STERILE (DRAPES) ×3 IMPLANT
DECANTER SPIKE VIAL GLASS SM (MISCELLANEOUS) ×3 IMPLANT
DERMABOND ADVANCED (GAUZE/BANDAGES/DRESSINGS) ×2
DERMABOND ADVANCED .7 DNX12 (GAUZE/BANDAGES/DRESSINGS) ×1 IMPLANT
DIFFUSER DRILL AIR PNEUMATIC (MISCELLANEOUS) ×3 IMPLANT
DRAPE C-ARM 42X72 X-RAY (DRAPES) ×6 IMPLANT
DRAPE C-ARMOR (DRAPES) IMPLANT
DRAPE INCISE IOBAN 66X45 STRL (DRAPES) ×3 IMPLANT
DRAPE LAPAROTOMY 100X72X124 (DRAPES) ×3 IMPLANT
DRAPE SURG 17X23 STRL (DRAPES) ×3 IMPLANT
DRILL PLIF MAS DISP 5.5MM (DRILL) ×3
DURAPREP 26ML APPLICATOR (WOUND CARE) ×3 IMPLANT
ELECT BLADE 4.0 EZ CLEAN MEGAD (MISCELLANEOUS) ×3
ELECT REM PT RETURN 9FT ADLT (ELECTROSURGICAL) ×3
ELECTRODE BLDE 4.0 EZ CLN MEGD (MISCELLANEOUS) ×1 IMPLANT
ELECTRODE REM PT RTRN 9FT ADLT (ELECTROSURGICAL) ×1 IMPLANT
GAUZE 4X4 16PLY RFD (DISPOSABLE) ×3 IMPLANT
GAUZE SPONGE 4X4 12PLY STRL (GAUZE/BANDAGES/DRESSINGS) IMPLANT
GLOVE ECLIPSE 6.5 STRL STRAW (GLOVE) ×6 IMPLANT
GLOVE EXAM NITRILE XL STR (GLOVE) IMPLANT
GOWN STRL REUS W/ TWL LRG LVL3 (GOWN DISPOSABLE) ×2 IMPLANT
GOWN STRL REUS W/ TWL XL LVL3 (GOWN DISPOSABLE) IMPLANT
GOWN STRL REUS W/TWL 2XL LVL3 (GOWN DISPOSABLE) IMPLANT
GOWN STRL REUS W/TWL LRG LVL3 (GOWN DISPOSABLE) ×4
GOWN STRL REUS W/TWL XL LVL3 (GOWN DISPOSABLE)
KIT BASIN OR (CUSTOM PROCEDURE TRAY) ×3 IMPLANT
KIT POSITION SURG JACKSON T1 (MISCELLANEOUS) ×3 IMPLANT
KIT TURNOVER KIT B (KITS) ×3 IMPLANT
MILL MEDIUM DISP (BLADE) ×3 IMPLANT
NEEDLE HYPO 25X1 1.5 SAFETY (NEEDLE) ×3 IMPLANT
NEEDLE SPNL 18GX3.5 QUINCKE PK (NEEDLE) IMPLANT
NS IRRIG 1000ML POUR BTL (IV SOLUTION) ×12 IMPLANT
OIL CARTRIDGE MAESTRO DRILL (MISCELLANEOUS) ×3
PACK LAMINECTOMY NEURO (CUSTOM PROCEDURE TRAY) ×3 IMPLANT
PAD ARMBOARD 7.5X6 YLW CONV (MISCELLANEOUS) ×9 IMPLANT
ROD COCR RELINE LORD 5X85 (Rod) ×6 IMPLANT
SCREW LOCK RSS 4.5/5.0MM (Screw) ×24 IMPLANT
SCREW SHANK RELINE MOD 5.5X30 (Screw) ×9 IMPLANT
SCREW SHANK RELINE MOD 5.5X35 (Screw) ×15 IMPLANT
SPONGE LAP 4X18 RFD (DISPOSABLE) IMPLANT
SPONGE SURGIFOAM ABS GEL 100 (HEMOSTASIS) ×3 IMPLANT
STRIP CLOSURE SKIN 1/2X4 (GAUZE/BANDAGES/DRESSINGS) IMPLANT
SUT PROLENE 6 0 BV (SUTURE) IMPLANT
SUT VIC AB 0 CT1 18XCR BRD8 (SUTURE) ×2 IMPLANT
SUT VIC AB 0 CT1 8-18 (SUTURE) ×4
SUT VIC AB 2-0 CT1 18 (SUTURE) ×6 IMPLANT
SUT VIC AB 3-0 SH 8-18 (SUTURE) ×9 IMPLANT
TOWEL GREEN STERILE (TOWEL DISPOSABLE) ×3 IMPLANT
TOWEL GREEN STERILE FF (TOWEL DISPOSABLE) ×3 IMPLANT
WATER STERILE IRR 1000ML POUR (IV SOLUTION) ×3 IMPLANT

## 2020-01-05 NOTE — Op Note (Signed)
01/05/2020  6:07 PM  PATIENT:  Christine Garrett  64 y.o. female with neurogenic claudication due to severe stenosis and facet arthropathy. She has listhesis at L2/3,3/4, and 4/5 contributing to the stenosis  PRE-OPERATIVE DIAGNOSIS:  Lumbar spondylolisthesis Lumbar Two-Lumbar Three, Lumbar Three-Lumbar Four, Lumbar Four-Lumbar Five Lumbar spondylolisthesis L2/3,3/4,4/5 POST-OPERATIVE DIAGNOSIS:  Lumbar stenosis Neurogenic Claudication Lumbar Two-Lumbar Three, Lumbar Three-Lumbar Four, Lumbar Four-Lumbar Five Lumbar spondylolisthesis L2/3,3/4,4/5 PROCEDURE:  Procedure(s): Lumbar Two-Three Lumbar Three-Four Lumbar Four-Five Posterior lumbar interbody fusion with decompression with titanium cages packed with autograft morsels Segmental pedicle screw fixation L2-L5(Nuvasive relign hardware)  Laminectomies of L2,3,and 4 beyond the needed exposure for a PLIF at L2/3,3/4, and 4/5 SURGEON:  Surgeon(s): Ashok Pall, MD Kary Kos, MD  ASSISTANTS:Cram, Dominica Severin  ANESTHESIA:   general  EBL:  Total I/O In: 5050 [I.V.:4000; IV Piggyback:1050] Out: 83 [Urine:460; Blood:400]  BLOOD ADMINISTERED:none  CELL SAVER GIVEN:none  COUNT:per nursing  DRAINS: none   SPECIMEN:  No Specimen  DICTATION: Christine Garrett is a 64 y.o. female whom was taken to the operating room intubated, and placed under a general anesthetic without difficulty. A foley catheter was placed under sterile conditions. She was positioned prone on a Jackson table with all pressure points properly padded.  Her lumbar region was prepped and draped in a sterile manner. I infiltrated 30cc's 1/2%lidocaine/1:2000,000 strength epinephrine into the planned incision. I opened the skin with a 10 blade and took the incision down to the thoracolumbar fascia. I exposed the lamina of L1,2,3,4, and 5 in a subperiosteal fashion bilaterally. I confirmed my location with an intraoperative xray.  I placed self retaining retractors and started the  decompression.  I decompressed the spinal canal via laminectomies and facetectomies at L2,3,4, and a hemilaminectomy of L5. I completed inferior facetectomies of L2, 3, and L4. I per formed partial superior facetectomies of L2,3,4, and 5. I used the drill and Kerrison punches to remove bone and the ligamentum flavum in order to decompress the spinal canal and the L2,3,4, and L5 roots in the canal, lateral recesses, and foramina of L2/3,3/4, and 4/5. I removed far more bone than what is needed to perform a plif at those same levels.  PLIF's were performed at L2/3,3/4,and 4/5 in the same fashion. I opened the disc space with a 15 blade then used a variety of instruments to remove the disc and prepare the space for the arthrodesis. I used curettes, rongeurs, punches, shavers for the disc space, and rasps in the discetomy. I measured the disc space and placed  38mm cages at L2/3, and 12mm cages at 3/4, and 4/5(Synthes) into the disc space(s). The cages were all packed with autograft morsels  We placed pedicle screws at L2,3,4, and 5, using fluoroscopic guidance. I drilled a pilot hole, then cannulated the pedicle with a drill at each site. We then tapped each pedicle, assessing each site for pedicle violations. No cutouts were appreciated. Screws (nuvasive relign) were then placed at each site without difficulty. We attached rods and locking caps with the appropriate tools. The locking caps were secured with torque limited screwdrivers. Final films were performed and the final construct appeared to be in good position.  We closed the wound in a layered fashion. I approximated the thoracolumbar fascia, subcutaneous, and subcuticular planes with vicryl sutures. I used dermabond and an occlusive bandage for a sterile dressing.     PLAN OF CARE: Admit to inpatient   PATIENT DISPOSITION:  PACU - hemodynamically stable.   Delay start of  Pharmacological VTE agent (>24hrs) due to surgical blood loss or risk of  bleeding:  yes

## 2020-01-05 NOTE — Anesthesia Procedure Notes (Signed)
Procedure Name: Intubation Date/Time: 01/05/2020 9:03 AM Performed by: Renato Shin, CRNA Pre-anesthesia Checklist: Patient identified, Emergency Drugs available, Suction available and Patient being monitored Patient Re-evaluated:Patient Re-evaluated prior to induction Oxygen Delivery Method: Circle system utilized Preoxygenation: Pre-oxygenation with 100% oxygen Induction Type: IV induction Ventilation: Mask ventilation without difficulty and Oral airway inserted - appropriate to patient size Laryngoscope Size: Mac and 3 Grade View: Grade I Tube type: Oral Tube size: 7.5 mm Number of attempts: 1 Airway Equipment and Method: Stylet and Oral airway Placement Confirmation: ETT inserted through vocal cords under direct vision,  positive ETCO2 and breath sounds checked- equal and bilateral Secured at: 22 cm Tube secured with: Tape Dental Injury: Teeth and Oropharynx as per pre-operative assessment  Comments: Intubated by lauren ferm, srna under supervision of md and crna

## 2020-01-05 NOTE — Progress Notes (Signed)
Attempted to call patient's daughter to update on room assignment.  No answer.

## 2020-01-05 NOTE — Anesthesia Procedure Notes (Signed)
Arterial Line Insertion Start/End7/21/2021 7:05 AM, 01/05/2020 7:10 AM Performed by: Renato Shin, CRNA, CRNA  Patient location: Pre-op. Preanesthetic checklist: patient identified, IV checked, site marked, risks and benefits discussed, surgical consent, monitors and equipment checked, pre-op evaluation, timeout performed and anesthesia consent Lidocaine 1% used for infiltration radial was placed Catheter size: 20 G Hand hygiene performed  and maximum sterile barriers used   Attempts: 2 (first attempt by lauren ferm, srna and second attempt successful by crna) Procedure performed without using ultrasound guided technique. Following insertion, dressing applied. Post procedure assessment: normal and unchanged

## 2020-01-05 NOTE — Anesthesia Postprocedure Evaluation (Signed)
Anesthesia Post Note  Patient: Christine Garrett  Procedure(s) Performed: Lumbar Two-Three Lumbar Three-Four Lumbar Four-Five Posterior lumbar interbody fusion with decompression (N/A Spine Lumbar)     Patient location during evaluation: PACU Anesthesia Type: General Level of consciousness: awake and alert Pain management: pain level controlled Vital Signs Assessment: post-procedure vital signs reviewed and stable Respiratory status: spontaneous breathing, nonlabored ventilation, respiratory function stable and patient connected to nasal cannula oxygen Cardiovascular status: blood pressure returned to baseline and stable Postop Assessment: no apparent nausea or vomiting Anesthetic complications: no   No complications documented.  Last Vitals:  Vitals:   01/05/20 1815 01/05/20 1900  BP: (!) 142/69 (!) 159/74  Pulse: 83 80  Resp: 10 16  Temp:    SpO2: 99% 100%    Last Pain:  Vitals:   01/05/20 1800  PainSc: 8                  Jonothan Heberle DAVID

## 2020-01-05 NOTE — H&P (Signed)
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  She had her back fused and a sample taken.  She is now cancer free, I still believe.  She says no one has been following anything recently.  She comes in today with a new problem of severe pain in the back and lower extremities whenever she stands or walks.  She just finds it hard to receive comfort from any position.  MRI shows severe stenosis at 2-3, 3-4, and at 4-5.  She is listhesed 2 on 3, 3 on 4, and 4 on 5.  She has horrific facet arthropathy, a great deal of fluid at 4-5 bilaterally, and even more fluid on the left at 2-3, and stenosis is present at each one of these levels.  She needs a decompression and arthrodesis.  Whether she would need interbody at each level cannot absolutely say, but typically will help, but she has very bad stenosis and it makes sense that she is having this kind of pain when standing or walking.  She has full strength in the lower extremities.  Normal muscle tone, bulk, and coordination.  Reflexes are intact at the knees, trace at the right ankle, 1+ at the left ankle.  Gait is markedly antalgic.  She does use a cane.  She is able to stand on her toes but cannot take steps.  She is able to stand on heels but cannot take steps.  I did not ask her to do a squat.  She has normal muscle tone, bulk, and coordination in the upper extremities. Allergies  Allergen Reactions  . Hydrocodone Other (See Comments)    MENTAL STATUS CHANGE  MENTAL STATUS CHANGE   (Pt states she is not allergic to Hydrocodone, she was recently taking it) MENTAL STATUS CHANGE     Past Medical History:  Diagnosis Date  . Arthritis    knees  . Cancer ([place])   . Carpal tunnel syndrome   . CHF (congestive heart failure) ([place])   . Diabetes mellitus    type 2  . GERD  (gastroesophageal reflux disease)   . Heart murmur    "Little, No concerns" per Dr  Dannielle Burn pt reported.  . Hypertension    controlled using a guided approch with plasma renin activity  . Multiple myeloma not having achieved remission ([place]) 01/06/2017  . Neuropathy   . Peripheral vascular disease ([place])   . Sleep apnea    01/03/2020: per patient, hasn't used CPAP machine in two years due to inability to breathe well with it on   Past Surgical History:  Procedure Laterality Date  . BIOPSY  11/28/2016   Procedure: BIOPSY;  Surgeon: Rogene Houston, MD;  [facility];  Service: Endoscopy;;  gastric  . BIOPSY  07/02/2019   Procedure: BIOPSY;  Surgeon: Rogene Houston, MD;  [facility];  Service: Endoscopy;;  . [place]  . CERVICAL CONE BIOPSY     cervical lesion  . COLONOSCOPY WITH PROPOFOL N/A 07/02/2019   Procedure: COLONOSCOPY WITH PROPOFOL;  Surgeon: Rogene Houston, MD;  [facility];  Service: Endoscopy;  Laterality: N/A;  12:40 - pt  can't come earlier due to transportation  . ESOPHAGOGASTRODUODENOSCOPY (EGD) WITH PROPOFOL N/A 11/28/2016   Procedure: ESOPHAGOGASTRODUODENOSCOPY (EGD) WITH PROPOFOL;  Surgeon: Rogene Houston, MD;  [facility];  Service: Endoscopy;  Laterality: N/A;  9:25  . IR FLUORO GUIDE PORT INSERTION RIGHT  01/29/2017  . IR US GUIDE VASC ACCESS RIGHT  01/29/2017  . lipoma removal     right shoulder 2001  . MULTIPLE EXTRACTIONS WITH ALVEOLOPLASTY Bilateral 08/24/2012   Procedure: MULTIPLE EXTRACTION WITH ALVEOLOPLASTY BIOPSY OF RIGHT AND LEFT MANDIBLE ;  Surgeon: Gae Bon, DDS;  Location: [facility];  Service: Oral Surgery;  Laterality: Bilateral;  . POLYPECTOMY  07/02/2019   Procedure: POLYPECTOMY;  Surgeon: Rogene Houston, MD;  [facility];  Service: Endoscopy;;  . POSTERIOR LUMBAR FUSION 4 LEVEL N/A 12/26/2016   Procedure: THORACIC EIGHT TUMOR RESECTION, THORACIC SIX- THORACIC TEN POSTERIOR SPINAL FUSION;   Surgeon: Ashok Pall, MD;  Location: [facility];  Service: Neurosurgery;  Laterality: N/A;  THORACIC 8 TUMOR RESECTION, THORACIC 6- THORACIC 10 POSTERIOR SPINAL FUSION  . ROTATOR CUFF REPAIR     right shoulder  . TUBAL LIGATION     Family History  Problem Relation Age of Onset  . Diabetes Mother   . Hypertension Mother   . Heart disease Mother   . Alzheimer's disease Mother   . Diabetes Father   . Heart disease Sister   . Diabetes Sister    Social History   Socioeconomic History  . Marital status: Single    Spouse name: Not on file  . Number of children: 3  . Years of education: Not on file  . Highest education level: Not on file  Occupational History  . Occupation: on disability  . Occupation: former Quarry manager  Tobacco Use  . Smoking status: Former Smoker    Packs/day: 0.50    Years: 6.00    Pack years: 3.00    Types: Cigarettes    Quit date: 2019    Years since quitting: 2.5  . Smokeless tobacco: Never Used  Vaping Use  . Vaping Use: Never used  Substance and Sexual Activity  . Alcohol use: No    Alcohol/week: 6.0 standard drinks    Types: 6 Cans of beer per week  . Drug use: No  . Sexual activity: Not Currently    Birth control/protection: Post-menopausal  Other Topics Concern  . Not on file  Social History Narrative   Lives with son, daughter and grandkids in a 2 story home but stays on the first floor.  Has 3 children.  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      Right handed   Social Determinants of Health   Financial Resource Strain:   . Difficulty of Paying Living Expenses:   Food Insecurity:   . Worried About Charity fundraiser in the Last Year:   . Arboriculturist in the Last Year:   Transportation Needs:   . Film/video editor (Medical):   Marland Kitchen Lack of Transportation (Non-Medical):   Physical Activity:   . Days of Exercise per Week:   . Minutes of Exercise per Session:   Stress:   . Feeling of Stress :   Social  Connections:   . Frequency of Communication with Friends and Family:   . Frequency of Social Gatherings with Friends and Family:   . Attends Religious Services:   . Active Member of Clubs or  Organizations:   . Attends Archivist Meetings:   Marland Kitchen Marital Status:   Intimate Partner Violence:   . Fear of Current or Ex-Partner:   . Emotionally Abused:   Marland Kitchen Physically Abused:   . Sexually Abused:    Prior to Admission medications   Medication Sig Start Date End Date Taking? Authorizing Provider  amLODipine (NORVASC) 5 MG tablet Take 5 mg by mouth daily. 09/25/19  Yes [provider]  apixaban (ELIQUIS) 5 MG TABS tablet Take 1 tablet (5 mg total) by mouth 2 (two) times daily. 07/03/19  Yes Rehman, Mechele Dawley, MD  atorvastatin (LIPITOR) 40 MG tablet Take 40 mg by mouth daily.   Yes [provider]  dicyclomine (BENTYL) 10 MG capsule Take 1 capsule (10 mg total) by mouth 3 (three) times daily as needed for spasms. 07/02/19  Yes Rehman, Mechele Dawley, MD  enalapril (VASOTEC) 20 MG tablet Take 20 mg by mouth 2 (two) times daily.    Yes [provider]  gabapentin (NEURONTIN) 300 MG capsule Take 600 mg by mouth 3 (three) times daily. 09/25/19  Yes [provider]  hydrALAZINE (APRESOLINE) 25 MG tablet Take 25 mg by mouth 2 (two) times daily. 09/29/19  Yes [provider]  insulin degludec (TRESIBA FLEXTOUCH) 100 UNIT/ML SOPN FlexTouch Pen Inject 0.5 mLs (50 Units total) into the skin daily at 10 pm. 11/20/16  Yes Nida, Marella Chimes, MD  metFORMIN (GLUCOPHAGE) 1000 MG tablet Take 1,000 mg by mouth 2 (two) times daily.    Yes [provider]  methocarbamol (ROBAXIN) 500 MG tablet Take 500 mg by mouth daily. 11/02/19  Yes [provider]  metoprolol succinate (TOPROL-XL) 50 MG 24 hr tablet Take 50 mg by mouth daily. Take with or immediately following a meal.   Yes [provider]  Omega-3 Fatty Acids (FISH OIL) 1000 MG CPDR Take 1,000 mg  by mouth daily. Omega 3 300 mg   Yes [provider]  potassium chloride (MICRO-K) 10 MEQ CR capsule Take 10 mEq by mouth daily.   Yes [provider]  REVLIMID 5 MG capsule TAKE 1 CAPSULE (5MG) BY  MOUTH DAILY FOR 21 DAYS ON, THEN 7 DAYS OFF Patient taking differently: Take 5 mg by mouth See admin instructions. TAKE 1 CAPSULE (5MG) BY  MOUTH DAILY FOR 21 DAYS ON, THEN 7 DAYS OFF 05/23/19  Yes Brunetta Genera, MD  Vitamin D, Ergocalciferol, (DRISDOL) 50000 units CAPS capsule Take 50,000 Units by mouth every Monday.  07/14/17  Yes [provider]  Insulin Pen Needle (B-D ULTRAFINE III SHORT PEN) 31G X 8 MM MISC 1 each by Does not apply route as directed. 11/13/16   Cassandria Anger, MD       I do believe Mrs. Klich would benefit from an extensive lumbar decompression from 2 to 5, posterior instrumentation secondary to the listhesed segments at 2-3, 3-4, and 4-5, and probable interbodies also.

## 2020-01-05 NOTE — Transfer of Care (Signed)
Immediate Anesthesia Transfer of Care Note  Patient: Christine Garrett  Procedure(s) Performed: Lumbar Two-Three Lumbar Three-Four Lumbar Four-Five Posterior lumbar interbody fusion with decompression (N/A Spine Lumbar)  Patient Location: PACU  Anesthesia Type:General  Level of Consciousness: awake, drowsy and patient cooperative  Airway & Oxygen Therapy: Patient Spontanous Breathing and Patient connected to face mask oxygen  Post-op Assessment: Report given to RN, Post -op Vital signs reviewed and stable and Patient moving all extremities X 4  Post vital signs: Reviewed and stable  Last Vitals:  Vitals Value Taken Time  BP 132/64 01/05/20 1634  Temp 37.2 C 01/05/20 1634  Pulse 85 01/05/20 1643  Resp 25 01/05/20 1643  SpO2 100 % 01/05/20 1643  Vitals shown include unvalidated device data.  Last Pain:  Vitals:   01/05/20 0658  PainSc: 10-Worst pain ever      Patients Stated Pain Goal: 3 (49/75/30 0511)  Complications: No complications documented.

## 2020-01-06 ENCOUNTER — Encounter (HOSPITAL_COMMUNITY): Payer: Self-pay | Admitting: Neurosurgery

## 2020-01-06 LAB — GLUCOSE, CAPILLARY
Glucose-Capillary: 106 mg/dL — ABNORMAL HIGH (ref 70–99)
Glucose-Capillary: 134 mg/dL — ABNORMAL HIGH (ref 70–99)
Glucose-Capillary: 177 mg/dL — ABNORMAL HIGH (ref 70–99)
Glucose-Capillary: 194 mg/dL — ABNORMAL HIGH (ref 70–99)
Glucose-Capillary: 204 mg/dL — ABNORMAL HIGH (ref 70–99)
Glucose-Capillary: 224 mg/dL — ABNORMAL HIGH (ref 70–99)

## 2020-01-06 NOTE — Social Work (Signed)
CSW acknowledging consult for SNF/HH/DME. Will follow for therapy recommendations needed to best determine disposition/for insurance authorization.   Westley Hummer, MSW, [place] [employer]

## 2020-01-06 NOTE — Progress Notes (Signed)
Patient ID: Christine Garrett, female   DOB: 1955-10-10, 64 y.o.   MRN: [id number] BP (!) 144/74 (BP Location: Right Arm)   Pulse 93   Temp 99.3 F (37.4 C) (Oral)   Resp 18   Ht 5\' 9"  (1.753 m)   Wt 107.6 kg   SpO2 100%   BMI 35.03 kg/m  Alert and oriented x4, speech is clear and fluent Moving lower extremities well Wound is clean, no signs of infection In a lot of pain, but it is expected. Working with PT/OT

## 2020-01-06 NOTE — TOC Initial Note (Addendum)
Transition of Care [facility]) - Initial/Assessment Note    Patient Details  Name: Christine Garrett MRN: [id number] Date of Birth: Nov 07, 1955  Transition of Care [facility]) CM/SW Contact:    Marilu Favre, RN Phone Number: 01/06/2020, 4:05 PM  Clinical Narrative:                  Spoke to patient at bedside. Patient confirmed face sheet information .   Ordered walker, tub shower bench and 3 in 1 with Zack with [facility].   Discussed PT recommendation for home health PT and provided medicare.gov list. Patient would like [facility]. Left Butch Penny with [facility] a message.   Explained to patient NCM will call Dr Lacy Duverney office for orders.   Called Dr Christella Noa office  spoke with [name] , she will message Dr Christella Noa and ask for HHPT order and face to face  Also sent MD secure chat. Patient lives with daughter and son.  Expected Discharge Plan: [place]     Patient Goals and CMS Choice Patient states their goals for this hospitalization and ongoing recovery are:: to return to home CMS Medicare.gov Compare Post Acute Care list provided to:: Patient Choice offered to / list presented to : Patient  Expected Discharge Plan and Services Expected Discharge Plan: [place]   Discharge Planning Services: CM Consult Post Acute Care Choice: Home Health, Durable Medical Equipment Living arrangements for the past 2 months: Single Family Home                 DME Arranged: 3-N-1, Shower stool, Walker rolling DME Agency: [employer] Date DME Agency Contacted: 01/06/20 Time DME Agency Contacted: [phone]   [place]: PT          Prior Living Arrangements/Services Living arrangements for the past 2 months: [place] Lives with:: Adult Children Patient language and need for interpreter reviewed:: Yes Do you feel safe going back to the place where you live?: Yes      Need for Family Participation in Patient Care: Yes (Comment) Care giver  support system in place?: Yes (comment)   Criminal Activity/Legal Involvement Pertinent to Current Situation/Hospitalization: No - Comment as needed  Activities of Daily Living Home Assistive Devices/Equipment: Cane (specify quad or straight), Walker (specify type) ADL Screening (condition at time of admission) Patient's cognitive ability adequate to safely complete daily activities?: Yes Is the patient deaf or have difficulty hearing?: No Does the patient have difficulty seeing, even when wearing glasses/contacts?: No Does the patient have difficulty concentrating, remembering, or making decisions?: No Patient able to express need for assistance with ADLs?: Yes Does the patient have difficulty dressing or bathing?: No Independently performs ADLs?: Yes (appropriate for developmental age) Does the patient have difficulty walking or climbing stairs?: Yes Weakness of Legs: Both Weakness of Arms/Hands: None  Permission Sought/Granted   Permission granted to share information with : No              Emotional Assessment Appearance:: Appears stated age Attitude/Demeanor/Rapport: Engaged Affect (typically observed): Accepting Orientation: : Oriented to Self, Oriented to Place, Oriented to  Time, Oriented to Situation Alcohol / Substance Use: Not Applicable Psych Involvement: No (comment)  Admission diagnosis:  Spondylolisthesis of lumbar region [M43.16] Patient Active Problem List   Diagnosis Date Noted  . Spondylolisthesis of lumbar region 01/05/2020  . Generalized abdominal pain 06/03/2019  . Port-A-Cath in place 08/21/2018  . H/O autologous stem cell transplant ([place]) 07/01/2017  .  Autologous donor of stem cells 05/20/2017  . Phlebitis and thrombophlebitis of superficial vessels of right lower extremity 01/24/2017  . Multiple myeloma in remission ([place]) 01/06/2017  . Neuropathy 01/06/2017  . Hypercholesteremia 11/04/2016  . Class 1 obesity due to excess calories with serious  comorbidity and body mass index (BMI) of 31.0 to 31.9 in adult 11/04/2016  . Gastroesophageal reflux disease without esophagitis 10/30/2016  . Abdominal pain, chronic, epigastric 10/30/2016  . OBSTRUCTIVE SLEEP APNEA 08/15/2009  . BEN HTN HEART DISEASE WITHOUT HEART FAIL 08/15/2009  . Uncontrolled type 2 diabetes mellitus with complication, without long-term current use of insulin ([place]) 07/27/2009  . TOBACCO ABUSE 07/27/2009  . Essential hypertension, benign 07/27/2009   PCP:  Neale Burly, MD Pharmacy:   [id number] [place] [address]Phone: [phone]: [phone] [address] [place]Phone: [phone]: [phone] [facility] [address]Phone: [phone]: [phone]    Social Determinants of Health (SDOH) Interventions    Readmission Risk Interventions No flowsheet data found.

## 2020-01-06 NOTE — Evaluation (Signed)
Physical Therapy Evaluation Patient Details Name: Christine Garrett MRN: [id number] DOB: 09-04-1955 Today's Date: 01/06/2020   History of Present Illness  Pt is a 64 y.o. F iwth significant PMH of cancer, carpal tunnel syndrome, CHF, diabetes mellitus 2, T8 tumor resection, T6-10 posterior spinal fusion who presents with neurogenic claudication due to severe stenosis and facet arthropathy now s/p L3-5 PLIF with decompression.  Clinical Impression  Prior to admission, pt lives with her children, ambulates with a cane and is independent with ADL's. Pt reporting back pain with radicular symptoms into BLE's and numbness in bilateral toes. Pt displays decreased functional mobility secondary to balance deficits, weakness (particularly distally with ankle dorsiflexion/plantarflexion), and decreased activity tolerance. Mobilizing from bed to chair with a walker and min guard-min assist. Further mobility limited by mild dizziness and fatigue. Suspect pt will progress well. Will continue to progress mobility as tolerated.     Follow Up Recommendations Home health PT;Supervision for mobility/OOB    Equipment Recommendations  Rolling walker with 5" wheels;3in1 (PT)    Recommendations for Other Services       Precautions / Restrictions Precautions Precautions: Fall;Back Precaution Booklet Issued: Yes (comment) Precaution Comments: Verbally reviewed and provided written handout Required Braces or Orthoses: Spinal Brace Spinal Brace: Lumbar corset;Applied in sitting position Restrictions Weight Bearing Restrictions: No      Mobility  Bed Mobility Overal bed mobility: Needs Assistance Bed Mobility: Rolling;Sidelying to Sit Rolling: Modified independent (Device/Increase time) Sidelying to sit: Min assist       General bed mobility comments: Cues for log roll technique, minA for trunk elevation to upright. Use of bed rail and HOB elevated  Transfers Overall transfer level: Needs  assistance Equipment used: Rolling walker (2 wheeled) Transfers: Sit to/from Stand Sit to Stand: Min assist         General transfer comment: MinA to rise from edge of bed, cues for hand placement. Increased time/effort  Ambulation/Gait Ambulation/Gait assistance: Min guard Gait Distance (Feet): 3 Feet Assistive device: Rolling walker (2 wheeled) Gait Pattern/deviations: Step-through pattern;Decreased stride length     General Gait Details: Pivotal steps from bed to chair, min guard for stability, cues for sequencing/direction  Stairs            Wheelchair Mobility    Modified Rankin (Stroke Patients Only)       Balance Overall balance assessment: Needs assistance Sitting-balance support: Feet supported Sitting balance-Leahy Scale: Good     Standing balance support: Bilateral upper extremity supported Standing balance-Leahy Scale: Poor Standing balance comment: Reliant on external support                             Pertinent Vitals/Pain Pain Assessment: Faces Faces Pain Scale: Hurts little more Pain Location: back with radicular symptoms into BLE's Pain Descriptors / Indicators: Radiating;Grimacing;Operative site guarding Pain Intervention(s): Limited activity within patient's tolerance;Monitored during session    Home Living Family/patient expects to be discharged to:: Private residence Living Arrangements: Children (son, daughter) Available Help at Discharge: Family Type of Home: House Home Access: Stairs to enter Entrance Stairs-Rails: None Technical brewer of Steps: 3 Home Layout: One level Home Equipment: Cane - single point      Prior Function Level of Independence: Independent with assistive device(s)         Comments: Using cane, independent ADL's     Hand Dominance        Extremity/Trunk Assessment   Upper Extremity Assessment Upper Extremity Assessment:  Defer to OT evaluation    Lower Extremity  Assessment Lower Extremity Assessment: RLE deficits/detail;LLE deficits/detail RLE Deficits / Details: Hip flexion 5/5, knee extension 5/5, ankle dorsiflexion/plantarflexion 3/5 LLE Deficits / Details: Hip flexion 5/5, knee extension 5/5, ankle dorsiflexion/plantarflexion 3+/5    Cervical / Trunk Assessment Cervical / Trunk Assessment: Other exceptions Cervical / Trunk Exceptions: s/p PLIF  Communication   Communication: No difficulties  Cognition Arousal/Alertness: Awake/alert Behavior During Therapy: WFL for tasks assessed/performed Overall Cognitive Status: Within Functional Limits for tasks assessed                                        General Comments      Exercises     Assessment/Plan    PT Assessment Patient needs continued PT services  PT Problem List Decreased strength;Decreased activity tolerance;Decreased balance;Decreased mobility;Pain       PT Treatment Interventions DME instruction;Gait training;Functional mobility training;Stair training;Therapeutic activities;Therapeutic exercise;Balance training;Patient/family education    PT Goals (Current goals can be found in the Care Plan section)  Acute Rehab PT Goals Patient Stated Goal: less pain PT Goal Formulation: With patient Time For Goal Achievement: 01/20/20 Potential to Achieve Goals: Good    Frequency Min 5X/week   Barriers to discharge        Co-evaluation               AM-PAC PT "6 Clicks" Mobility  Outcome Measure Help needed turning from your back to your side while in a flat bed without using bedrails?: None Help needed moving from lying on your back to sitting on the side of a flat bed without using bedrails?: A Little Help needed moving to and from a bed to a chair (including a wheelchair)?: A Little Help needed standing up from a chair using your arms (e.g., wheelchair or bedside chair)?: A Little Help needed to walk in hospital room?: A Little Help needed climbing  3-5 steps with a railing? : A Lot 6 Click Score: 18    End of Session Equipment Utilized During Treatment: Gait belt;Back brace Activity Tolerance: Patient limited by pain;Other (comment) (dizziness) Patient left: in chair;with call bell/phone within reach;with chair alarm set Nurse Communication: Mobility status PT Visit Diagnosis: Pain;Difficulty in walking, not elsewhere classified (R26.2) Pain - part of body:  (back)    Time: 2355-7322 PT Time Calculation (min) (ACUTE ONLY): 23 min   Charges:   PT Evaluation $PT Eval Moderate Complexity: 1 Mod PT Treatments $Therapeutic Activity: 8-22 mins          Wyona Almas, PT, DPT Acute Rehabilitation Services Pager [phone] Office [phone]   Deno Etienne 01/06/2020, 11:45 AM

## 2020-01-06 NOTE — Progress Notes (Signed)
Pt arrived to 6N17 from ED via bed. Report received from Page, [place]. Pt awake and alert. Pt c/o 9/10 pain in lower back surgical site. See MAR. Dressing is clean, dry and intact. See assessment. Will continue to monitor patient.

## 2020-01-06 NOTE — Evaluation (Signed)
Occupational Therapy Evaluation Patient Details Name: Christine Garrett MRN: [id number] DOB: Nov 12, 1955 Today's Date: 01/06/2020    History of Present Illness Pt is a 64 y.o. F iwth significant PMH of cancer, carpal tunnel syndrome, CHF, diabetes mellitus 2, T8 tumor resection, T6-10 posterior spinal fusion who presents with neurogenic claudication due to severe stenosis and facet arthropathy now s/p L3-5 PLIF with decompression.   Clinical Impression   Pt was ambulating with a cane and dependent on her children for housekeeping, but able to perform self care independently prior to admission. Pt presents with mild dizziness and decreased standing balance. She is likely to progress well and not require post acute OT. Will follow acutely.    Follow Up Recommendations  No OT follow up    Equipment Recommendations  3 in 1 bedside commode;Tub/shower seat    Recommendations for Other Services       Precautions / Restrictions Precautions Precautions: Fall;Back Precaution Booklet Issued: Yes (comment) Precaution Comments: Verbally reviewed and provided written handout Required Braces or Orthoses: Spinal Brace Spinal Brace: Lumbar corset;Applied in sitting position Restrictions Weight Bearing Restrictions: No      Mobility Bed Mobility Overal bed mobility: Needs Assistance Bed Mobility: Rolling;Sidelying to Sit Rolling: Modified independent (Device/Increase time) Sidelying to sit: Min assist       General bed mobility comments: pt received in chair  Transfers Overall transfer level: Needs assistance Equipment used: Rolling walker (2 wheeled) Transfers: Sit to/from Stand Sit to Stand: Min guard         General transfer comment: min guard from recliner    Balance Overall balance assessment: Needs assistance Sitting-balance support: Feet supported Sitting balance-Leahy Scale: Good     Standing balance support: Bilateral upper extremity supported Standing balance-Leahy  Scale: Poor Standing balance comment: Reliant on external support                           ADL either performed or assessed with clinical judgement   ADL Overall ADL's : Needs assistance/impaired Eating/Feeding: Independent   Grooming: Wash/dry hands;Wash/dry face;Sitting;Set up   Upper Body Bathing: Set up;Sitting   Lower Body Bathing: Min guard;Sit to/from stand   Upper Body Dressing : Set up;Sitting   Lower Body Dressing: Min guard;Sit to/from stand   Toilet Transfer: Min guard;Stand-pivot;RW   Toileting- Water quality scientist and Hygiene: Set up;Sitting/lateral lean       Functional mobility during ADLs: Min guard;Rolling walker General ADL Comments: Educated pt in compensatory strategies for ADL and IADL to avoid.     Vision Baseline Vision/History: Wears glasses Patient Visual Report: No change from baseline       Perception     Praxis      Pertinent Vitals/Pain Pain Assessment: Faces Faces Pain Scale: Hurts little more Pain Location: back with radicular symptoms into BLE's Pain Descriptors / Indicators: Radiating;Grimacing;Operative site guarding Pain Intervention(s): Repositioned;Premedicated before session;Monitored during session     Hand Dominance Right   Extremity/Trunk Assessment Upper Extremity Assessment Upper Extremity Assessment: Overall WFL for tasks assessed   Lower Extremity Assessment Lower Extremity Assessment: Defer to PT evaluation RLE Deficits / Details: Hip flexion 5/5, knee extension 5/5, ankle dorsiflexion/plantarflexion 3/5 LLE Deficits / Details: Hip flexion 5/5, knee extension 5/5, ankle dorsiflexion/plantarflexion 3+/5   Cervical / Trunk Assessment Cervical / Trunk Assessment: Other exceptions Cervical / Trunk Exceptions: s/p PLIF   Communication Communication Communication: No difficulties   Cognition Arousal/Alertness: Awake/alert Behavior During Therapy: WFL for tasks assessed/performed  Overall Cognitive  Status: Within Functional Limits for tasks assessed                                     General Comments       Exercises     Shoulder Instructions      Home Living Family/patient expects to be discharged to:: Private residence Living Arrangements: Children (son, daughter, grandchild) Available Help at Discharge: Family Type of Home: House Home Access: Stairs to enter Technical brewer of Steps: 3 Entrance Stairs-Rails: None Home Layout: One level     Bathroom Shower/Tub: Occupational psychologist: Standard     Home Equipment: Cane - single point          Prior Functioning/Environment Level of Independence: Independent with assistive device(s)        Comments: Using cane, independent ADL's        OT Problem List: Impaired balance (sitting and/or standing);Decreased strength;Decreased knowledge of use of DME or AE;Pain      OT Treatment/Interventions: Self-care/ADL training;DME and/or AE instruction;Patient/family education;Balance training;Therapeutic activities    OT Goals(Current goals can be found in the care plan section) Acute Rehab OT Goals Patient Stated Goal: less pain OT Goal Formulation: With patient Time For Goal Achievement: 01/20/20 Potential to Achieve Goals: Good ADL Goals Pt Will Perform Grooming: with modified independence;standing Pt Will Perform Lower Body Bathing: with modified independence;sit to/from stand Pt Will Perform Lower Body Dressing: with modified independence;sit to/from stand Pt Will Transfer to Toilet: with modified independence;ambulating;bedside commode (over toilet) Pt Will Perform Toileting - Clothing Manipulation and hygiene: with modified independence;sit to/from stand Pt Will Perform Tub/Shower Transfer: with modified independence;ambulating;shower seat;Shower transfer;rolling walker Additional ADL Goal #1: Pt will adhere to back precautions during ADL.  OT Frequency: Min 2X/week    Barriers to D/C:            Co-evaluation              AM-PAC OT "6 Clicks" Daily Activity     Outcome Measure Help from another person eating meals?: None Help from another person taking care of personal grooming?: A Little Help from another person toileting, which includes using toliet, bedpan, or urinal?: A Little Help from another person bathing (including washing, rinsing, drying)?: A Little Help from another person to put on and taking off regular upper body clothing?: None Help from another person to put on and taking off regular lower body clothing?: A Little 6 Click Score: 20   End of Session Equipment Utilized During Treatment: Rolling walker;Back brace  Activity Tolerance: Patient tolerated treatment well Patient left: in chair;with call bell/phone within reach  OT Visit Diagnosis: Other abnormalities of gait and mobility (R26.89);Unsteadiness on feet (R26.81);Pain                Time: 1137-1200 OT Time Calculation (min): 23 min Charges:  OT General Charges $OT Visit: 1 Visit OT Evaluation $OT Eval Low Complexity: 1 Low OT Treatments $Self Care/Home Management : 8-22 mins  Nestor Lewandowsky, OTR/L Acute Rehabilitation Services Pager: [phone] Office: [phone]  Christine Garrett 01/06/2020, 12:52 PM

## 2020-01-07 LAB — GLUCOSE, CAPILLARY
Glucose-Capillary: 119 mg/dL — ABNORMAL HIGH (ref 70–99)
Glucose-Capillary: 130 mg/dL — ABNORMAL HIGH (ref 70–99)
Glucose-Capillary: 134 mg/dL — ABNORMAL HIGH (ref 70–99)
Glucose-Capillary: 142 mg/dL — ABNORMAL HIGH (ref 70–99)
Glucose-Capillary: 142 mg/dL — ABNORMAL HIGH (ref 70–99)
Glucose-Capillary: 145 mg/dL — ABNORMAL HIGH (ref 70–99)
Glucose-Capillary: 161 mg/dL — ABNORMAL HIGH (ref 70–99)

## 2020-01-07 NOTE — TOC Progression Note (Addendum)
Transition of Care [facility]) - Progression Note    Patient Details  Name: Christine Garrett MRN: [id number] Date of Birth: 14-Dec-1955  [facility]) CM/SW Contact  Jacalyn Lefevre Edson Snowball, RN Phone Number: 01/07/2020, 1:42 PM  Clinical Narrative:     Called Dr Hewitt Shorts office again regarding home health spoke to [place]. Awaiting call back. Received home health orders. Orders given to [facility] with [facility].  Expected Discharge Plan: [place]    Expected Discharge Plan and Services Expected Discharge Plan: [place]   Discharge Planning Services: CM Consult Post Acute Care Choice: Home Health, Durable Medical Equipment Living arrangements for the past 2 months: Single Family Home                 DME Arranged: 3-N-1, Shower stool, Walker rolling DME Agency: [employer] Date DME Agency Contacted: 01/06/20 Time DME Agency Contacted: [phone]   [place]: PT           Social Determinants of Health (SDOH) Interventions    Readmission Risk Interventions No flowsheet data found.

## 2020-01-07 NOTE — Progress Notes (Signed)
Physical Therapy Treatment Patient Details Name: Christine Garrett MRN: [id number] DOB: Jul 03, 1955 Today's Date: 01/07/2020    History of Present Illness Pt is a 64 y.o. F iwth significant PMH of cancer, carpal tunnel syndrome, CHF, diabetes mellitus 2, T8 tumor resection, T6-10 posterior spinal fusion who presents with neurogenic claudication due to severe stenosis and facet arthropathy now s/p L3-5 PLIF with decompression.    PT Comments    Pt progressing steadily towards her physical therapy goals. Ambulating 80 feet with a walker at a min guard assist level. Reviewed spinal precautions, brace use, generalized walking program, and car transfer technique. D/c plan remains appropriate.     Home health PT;Supervision for mobility/OOB     Equipment Recommendations  Rolling walker with 5" wheels;3in1 (PT)    Recommendations for Other Services       Precautions / Restrictions Precautions Precautions: Fall;Back Precaution Booklet Issued: Yes (comment) Precaution Comments: Verbally reviewed and provided written handout Required Braces or Orthoses: Spinal Brace Spinal Brace: Lumbar corset;Applied in sitting position Restrictions Weight Bearing Restrictions: No    Mobility  Bed Mobility Overal bed mobility: Modified Independent             General bed mobility comments: Pt able to perform sidelying <> sit with modI, no physical assist required. Increased time/effort, HOB elevated  Transfers Overall transfer level: Needs assistance Equipment used: Rolling walker (2 wheeled) Transfers: Sit to/from Stand Sit to Stand: Min guard         General transfer comment: Min guard to rise from edge of bed. Cues for hand positioning  Ambulation/Gait Ambulation/Gait assistance: Min guard Gait Distance (Feet): 75 Feet Assistive device: Rolling walker (2 wheeled) Gait Pattern/deviations: Step-through pattern;Decreased stride length;Trunk flexed     General Gait Details: Cues for  upright posture, min guard for safety. Pt with slow and steady pace   Chief Strategy Officer    Modified Rankin (Stroke Patients Only)       Balance Overall balance assessment: Needs assistance Sitting-balance support: Feet supported Sitting balance-Leahy Scale: Good     Standing balance support: Bilateral upper extremity supported Standing balance-Leahy Scale: Poor Standing balance comment: Reliant on external support                            Cognition Arousal/Alertness: Awake/alert Behavior During Therapy: WFL for tasks assessed/performed Overall Cognitive Status: Within Functional Limits for tasks assessed                                        Exercises      General Comments        Pertinent Vitals/Pain Pain Assessment: Faces Faces Pain Scale: Hurts even more Pain Location: back with radicular symptoms into BLE's Pain Descriptors / Indicators: Radiating;Grimacing;Operative site guarding Pain Intervention(s): Limited activity within patient's tolerance;Monitored during session    Home Living                      Prior Function            PT Goals (current goals can now be found in the care plan section) Acute Rehab PT Goals Patient Stated Goal: less pain PT Goal Formulation: With patient Time For Goal Achievement: 01/20/20 Potential to Achieve Goals: Good Progress towards PT goals: Progressing  toward goals    Frequency    Min 5X/week      PT Plan Current plan remains appropriate    Co-evaluation              AM-PAC PT "6 Clicks" Mobility   Outcome Measure  Help needed turning from your back to your side while in a flat bed without using bedrails?: None Help needed moving from lying on your back to sitting on the side of a flat bed without using bedrails?: A Little Help needed moving to and from a bed to a chair (including a wheelchair)?: A Little Help needed standing up  from a chair using your arms (e.g., wheelchair or bedside chair)?: A Little Help needed to walk in hospital room?: A Little Help needed climbing 3-5 steps with a railing? : A Lot 6 Click Score: 18    End of Session Equipment Utilized During Treatment: Gait belt;Back brace Activity Tolerance: Patient tolerated treatment well Patient left: with call bell/phone within reach;in bed;with bed alarm set Nurse Communication: Mobility status PT Visit Diagnosis: Pain;Difficulty in walking, not elsewhere classified (R26.2) Pain - part of body:  (back)     Time: 8333-8329 PT Time Calculation (min) (ACUTE ONLY): 14 min  Charges:  $Gait Training: 8-22 mins                       Christine Garrett, PT, DPT Acute Rehabilitation Services Pager [phone] Office [phone]    Christine Garrett 01/07/2020, 5:03 PM

## 2020-01-07 NOTE — Progress Notes (Signed)
Patient ID: Christine Garrett, female   DOB: [birth year], 64 y.o.   MRN: [id number] BP (!) 129/70 (BP Location: Left Arm)   Pulse 95   Temp 100.2 F (37.9 C) (Oral) Comment: RN aware  Resp 18   Ht 5\' 9"  (1.753 m)   Wt 107.6 kg   SpO2 100%   BMI 35.03 kg/m  Alert and oriented x 4, speech is clear and fluent Moving lower extremities well Continues PT,OT

## 2020-01-08 LAB — GLUCOSE, CAPILLARY
Glucose-Capillary: 84 mg/dL (ref 70–99)
Glucose-Capillary: 72 mg/dL (ref 70–99)
Glucose-Capillary: 120 mg/dL — ABNORMAL HIGH (ref 70–99)
Glucose-Capillary: 181 mg/dL — ABNORMAL HIGH (ref 70–99)
Glucose-Capillary: 133 mg/dL — ABNORMAL HIGH (ref 70–99)

## 2020-01-08 NOTE — Progress Notes (Signed)
Physical Therapy Treatment Patient Details Name: Christine Garrett MRN: [id number] DOB: 1955/10/19 Today's Date: 01/08/2020    History of Present Illness Pt is a 64 y.o. F iwth significant PMH of cancer, carpal tunnel syndrome, CHF, diabetes mellitus 2, T8 tumor resection, T6-10 posterior spinal fusion who presents with neurogenic claudication due to severe stenosis and facet arthropathy now s/p L3-5 PLIF with decompression.    PT Comments    Pt is progressing well towards goals. Today's session focused on gait and stair training. Pt became fearful on first attempt to ascend steps. On second attempt she was able to negotiate 3 steps but would benefit from additional stair training to improve confidence and safety. Will continue to follow acutely.     Follow Up Recommendations  Home health PT;Supervision for mobility/OOB     Equipment Recommendations  Rolling walker with 5" wheels;3in1 (PT)    Recommendations for Other Services       Precautions / Restrictions Precautions Precautions: Fall;Back Precaution Booklet Issued: Yes (comment) Precaution Comments: Verbally reviewed and provided written handout Required Braces or Orthoses: Spinal Brace Spinal Brace: Lumbar corset;Applied in sitting position Restrictions Weight Bearing Restrictions: No    Mobility  Bed Mobility Overal bed mobility: Modified Independent Bed Mobility: Rolling;Sidelying to Sit Rolling: Modified independent (Device/Increase time) Sidelying to sit: Supervision       General bed mobility comments: In chair on arrival  Transfers Overall transfer level: Needs assistance Equipment used: Rolling walker (2 wheeled) Transfers: Sit to/from Stand Sit to Stand: Min guard         General transfer comment: Completed multiple sit<>stands with no cues needed  Ambulation/Gait Ambulation/Gait assistance: Min guard Gait Distance (Feet): 100 Feet Assistive device: Rolling walker (2 wheeled) Gait  Pattern/deviations: Step-through pattern;Decreased stride length;Trunk flexed Gait velocity: decreased   General Gait Details: Cues for postural control and forward gaze. Decreased gate velocity secondary to post op pain.   Stairs Stairs: Yes Stairs assistance: Min assist Stair Management: No rails;Backwards;With walker Number of Stairs: 4 General stair comments: Pt was able to negotiate 1 step, however became fearful and could not proceed. Agreeable to try again after seated rest break. Pt was able to negotiate 3 steps with min A for RW management and stability.    Wheelchair Mobility    Modified Rankin (Stroke Patients Only)       Balance Overall balance assessment: Needs assistance Sitting-balance support: Feet supported Sitting balance-Leahy Scale: Good     Standing balance support: Bilateral upper extremity supported Standing balance-Leahy Scale: Poor Standing balance comment: Reliant on external support                            Cognition Arousal/Alertness: Awake/alert Behavior During Therapy: WFL for tasks assessed/performed Overall Cognitive Status: Within Functional Limits for tasks assessed                                        Exercises      General Comments        Pertinent Vitals/Pain Pain Assessment: Faces Faces Pain Scale: Hurts little more Pain Location: back with radicular symptoms into BLE's Pain Descriptors / Indicators: Radiating;Grimacing;Operative site guarding Pain Intervention(s): Monitored during session;Limited activity within patient's tolerance    Home Living  Prior Function            PT Goals (current goals can now be found in the care plan section) Acute Rehab PT Goals Patient Stated Goal: to go home PT Goal Formulation: With patient Time For Goal Achievement: 01/20/20 Potential to Achieve Goals: Good Progress towards PT goals: Progressing toward goals     Frequency    Min 5X/week      PT Plan Current plan remains appropriate    Co-evaluation              AM-PAC PT "6 Clicks" Mobility   Outcome Measure  Help needed turning from your back to your side while in a flat bed without using bedrails?: None Help needed moving from lying on your back to sitting on the side of a flat bed without using bedrails?: A Little Help needed moving to and from a bed to a chair (including a wheelchair)?: A Little Help needed standing up from a chair using your arms (e.g., wheelchair or bedside chair)?: A Little Help needed to walk in hospital room?: A Little Help needed climbing 3-5 steps with a railing? : A Little 6 Click Score: 19    End of Session Equipment Utilized During Treatment: Gait belt;Back brace Activity Tolerance: Patient tolerated treatment well Patient left: with call bell/phone within reach;in chair Nurse Communication: Mobility status PT Visit Diagnosis: Pain;Difficulty in walking, not elsewhere classified (R26.2) Pain - part of body:  (back)     Time: 3532-9924 PT Time Calculation (min) (ACUTE ONLY): 27 min  Charges:  $Gait Training: 23-37 mins                    Benjiman Core, [place] Pager [phone] Acute Rehab   Allena Katz 01/08/2020, 3:45 PM

## 2020-01-08 NOTE — Progress Notes (Signed)
Occupational Therapy Treatment Patient Details Name: Christine Garrett MRN: [id number] DOB: Sep 23, 1955 Today's Date: 01/08/2020    History of present illness Pt is a 64 y.o. F iwth significant PMH of cancer, carpal tunnel syndrome, CHF, diabetes mellitus 2, T8 tumor resection, T6-10 posterior spinal fusion who presents with neurogenic claudication due to severe stenosis and facet arthropathy now s/p L3-5 PLIF with decompression.   OT comments  Patient continues to make steady progress towards goals in skilled OT session. Patient's session encompassed basic ADLs and functional mobility in order to improve overall activity tolerance. Pt motivated to wash up and get dressed in session to date, demonstrating adherence to back precautions as well as completing multiple sit<>stand transfers in order to complete (no cues needed). Pt able to complete figure 4 to don underwear, but may benefit from AE in order to aid in functional independence/ease of dressing. Discharge remains appropriate at this time; will continue to follow acutely.    Follow Up Recommendations  Home health OT    Equipment Recommendations  Other (comment) (already delivered)    Recommendations for Other Services      Precautions / Restrictions Precautions Precautions: Fall;Back Precaution Booklet Issued: Yes (comment) Precaution Comments: Verbally reviewed and provided written handout Required Braces or Orthoses: Spinal Brace Spinal Brace: Lumbar corset;Applied in sitting position Restrictions Weight Bearing Restrictions: No       Mobility Bed Mobility Overal bed mobility: Modified Independent Bed Mobility: Rolling;Sidelying to Sit Rolling: Modified independent (Device/Increase time) Sidelying to sit: Supervision       General bed mobility comments: In chair on arrival  Transfers Overall transfer level: Needs assistance Equipment used: Rolling walker (2 wheeled) Transfers: Sit to/from Stand Sit to Stand: Min  guard         General transfer comment: Completed multiple sit<>stands with no cues needed    Balance Overall balance assessment: Needs assistance Sitting-balance support: Feet supported Sitting balance-Leahy Scale: Good     Standing balance support: Bilateral upper extremity supported Standing balance-Leahy Scale: Poor Standing balance comment: Reliant on external support                           ADL either performed or assessed with clinical judgement   ADL Overall ADL's : Needs assistance/impaired     Grooming: Wash/dry hands;Standing           Upper Body Dressing : Set up;Sitting   Lower Body Dressing: Min guard;Sit to/from stand Lower Body Dressing Details (indicate cue type and reason): Able to demonstrate figure 4 position in order to don underwear (would benefit from reacher) Toilet Transfer: Min guard;RW;Ambulation;BSC   Toileting- Clothing Manipulation and Hygiene: Sit to/from stand Toileting - Clothing Manipulation Details (indicate cue type and reason): Provided education with regard to peri-care to comply with precautions     Functional mobility during ADLs: Min guard;Rolling walker General ADL Comments: Session focus on getting washed up at sink and donning clothes to appropriately adhere to precautions, good demonstration throughout     Webber: Awake/alert Behavior During Therapy: WFL for tasks assessed/performed Overall Cognitive Status: Within Functional Limits for tasks assessed  Exercises     Shoulder Instructions       General Comments      Pertinent Vitals/ Pain       Pain Assessment: Faces Faces Pain Scale: Hurts little more Pain Location: back with radicular symptoms into BLE's Pain Descriptors / Indicators: Radiating;Grimacing;Operative site guarding Pain Intervention(s): Monitored during  session;Limited activity within patient's tolerance  Home Living                                          Prior Functioning/Environment              Frequency  Min 2X/week        Progress Toward Goals  OT Goals(current goals can now be found in the care plan section)  Progress towards OT goals: Progressing toward goals  Acute Rehab OT Goals Patient Stated Goal: to go home OT Goal Formulation: With patient Time For Goal Achievement: 01/20/20 Potential to Achieve Goals: Good  Plan Discharge plan remains appropriate    Co-evaluation                 AM-PAC OT "6 Clicks" Daily Activity     Outcome Measure   Help from another person eating meals?: None Help from another person taking care of personal grooming?: A Little Help from another person toileting, which includes using toliet, bedpan, or urinal?: A Little Help from another person bathing (including washing, rinsing, drying)?: A Little Help from another person to put on and taking off regular upper body clothing?: None Help from another person to put on and taking off regular lower body clothing?: A Little 6 Click Score: 20    End of Session Equipment Utilized During Treatment: Rolling walker;Back brace  OT Visit Diagnosis: Other abnormalities of gait and mobility (R26.89);Unsteadiness on feet (R26.81);Pain   Activity Tolerance Patient tolerated treatment well   Patient Left in chair;with call bell/phone within reach   Nurse Communication Mobility status;Patient requests pain meds        Time: 2956-2130 OT Time Calculation (min): 21 min  Charges: OT General Charges $OT Visit: 1 Visit OT Treatments $Self Care/Home Management : 8-22 mins  Corinne Ports E. Morgen Ritacco, COTA/L Acute Rehabilitation Services [place] 01/08/2020, 3:52 PM

## 2020-01-08 NOTE — Plan of Care (Signed)
  Problem: Education: Goal: Knowledge of General Education information will improve Description Including pain rating scale, medication(s)/side effects and non-pharmacologic comfort measures Outcome: Progressing   

## 2020-01-09 LAB — GLUCOSE, CAPILLARY
Glucose-Capillary: 105 mg/dL — ABNORMAL HIGH (ref 70–99)
Glucose-Capillary: 115 mg/dL — ABNORMAL HIGH (ref 70–99)
Glucose-Capillary: 144 mg/dL — ABNORMAL HIGH (ref 70–99)
Glucose-Capillary: 70 mg/dL (ref 70–99)
Glucose-Capillary: 94 mg/dL (ref 70–99)
Glucose-Capillary: 98 mg/dL (ref 70–99)

## 2020-01-09 MED ORDER — INSULIN ASPART 100 UNIT/ML ~~LOC~~ SOLN: 0.0000 [IU] | Freq: Three times a day (TID) | SUBCUTANEOUS | Status: DC

## 2020-01-09 MED ORDER — INSULIN ASPART 100 UNIT/ML ~~LOC~~ SOLN: 0.0000 [IU] | Freq: Every day | SUBCUTANEOUS | Status: DC

## 2020-01-09 MED ORDER — INSULIN ASPART 100 UNIT/ML ~~LOC~~ SOLN
0.0000 [IU] | Freq: Three times a day (TID) | SUBCUTANEOUS | Status: DC
  Administered 2020-01-10: 3 [IU] via SUBCUTANEOUS

## 2020-01-09 NOTE — Progress Notes (Signed)
Physical Therapy Treatment Patient Details Name: Christine Garrett MRN: [id number] DOB: 18-Oct-1955 Today's Date: 01/09/2020    History of Present Illness Pt is a 64 y.o. F iwth significant PMH of cancer, carpal tunnel syndrome, CHF, diabetes mellitus 2, T8 tumor resection, T6-10 posterior spinal fusion who presents with neurogenic claudication due to severe stenosis and facet arthropathy now s/p L3-5 PLIF with decompression.    PT Comments    Continuing work on functional mobility and activity tolerance;  Session focused on answering question and practicing skills in prep for dc home; Spent a lot of time discussing options for managing the 3 steps to enter her home (no rails), and practiced another technqiue to give Christine Garrett more options for getting in and out of her home; She was tired during PT session today, wondering if she had "overdone it" this morning, getting up and around her room, getting dressed and attending to morning ADLs earlier; Gave her clarification on brace wearing   Follow Up Recommendations  Home health PT;Supervision for mobility/OOB     Equipment Recommendations  Rolling walker with 5" wheels;3in1 (PT)    Recommendations for Other Services       Precautions / Restrictions Precautions Precautions: Fall;Back Precaution Booklet Issued: Yes (comment) Required Braces or Orthoses: Spinal Brace Spinal Brace: Lumbar corset;Applied in sitting position    Mobility  Bed Mobility Overal bed mobility: Modified Independent Bed Mobility: Rolling;Sidelying to Sit Rolling: Modified independent (Device/Increase time) Sidelying to sit: Modified independent (Device/Increase time)       General bed mobility comments: Managing log roll technqiue well  Transfers Overall transfer level: Needs assistance Equipment used: Rolling walker (2 wheeled) Transfers: Sit to/from Stand Sit to Stand: Min guard         General transfer comment: Very slow rise, but good hand  placement and no need for physical assist  Ambulation/Gait   Gait Distance (Feet): 40 Feet Assistive device: Rolling walker (2 wheeled) Gait Pattern/deviations: Step-through pattern;Decreased stride length;Trunk flexed Gait velocity: decreased   General Gait Details: Decreased gate velocity secondary to post op pain.   Stairs Stairs: Yes Stairs assistance: Min assist Stair Management: Forwards;Step to pattern;One rail Left Number of Stairs: 3 General stair comments: We discussed multiple techniques for managing stairs; Identified who can help her up the stairs at home initially upon dc (her 2 adult children), and that she is having HHPT come out to the house, and they can work with her on her stairs as well; today, Christine Garrett opted for practicing going up steps forward, with this therapist giving suport on R side, and rail (simulating bilateral support given by her 2 adult children); she was still slow and guarded, but seemed to like ascending forwards better, and tells me she can direct her kids in how to best support her   Wheelchair Mobility    Modified Rankin (Stroke Patients Only)       Balance     Sitting balance-Leahy Scale: Good       Standing balance-Leahy Scale: Poor (approaching Fair)                              Cognition Arousal/Alertness: Awake/alert Behavior During Therapy: WFL for tasks assessed/performed Overall Cognitive Status: Within Functional Limits for tasks assessed  Exercises      General Comments        Pertinent Vitals/Pain Pain Assessment: Faces Faces Pain Scale: Hurts little more Pain Location: back with radicular symptoms into BLE's Pain Descriptors / Indicators: Radiating;Grimacing;Operative site guarding Pain Intervention(s): Monitored during session    Home Living                      Prior Function            PT Goals (current goals can now  be found in the care plan section) Acute Rehab PT Goals Patient Stated Goal: to go home PT Goal Formulation: With patient Time For Goal Achievement: 01/20/20 Potential to Achieve Goals: Good Progress towards PT goals: Progressing toward goals    Frequency    Min 5X/week      PT Plan Current plan remains appropriate    Co-evaluation              AM-PAC PT "6 Clicks" Mobility   Outcome Measure  Help needed turning from your back to your side while in a flat bed without using bedrails?: None Help needed moving from lying on your back to sitting on the side of a flat bed without using bedrails?: A Little Help needed moving to and from a bed to a chair (including a wheelchair)?: A Little Help needed standing up from a chair using your arms (e.g., wheelchair or bedside chair)?: A Little Help needed to walk in hospital room?: A Little Help needed climbing 3-5 steps with a railing? : A Little 6 Click Score: 19    End of Session Equipment Utilized During Treatment: Gait belt;Back brace Activity Tolerance: Patient tolerated treatment well Patient left: with call bell/phone within reach;in chair Nurse Communication: Mobility status PT Visit Diagnosis: Pain;Difficulty in walking, not elsewhere classified (R26.2) Pain - part of body:  (back and radicular pain LEs)     Time: 1040-1106 PT Time Calculation (min) (ACUTE ONLY): 26 min  Charges:  $Gait Training: 8-22 mins $Therapeutic Activity: 8-22 mins                     Roney Marion, PT  Acute Rehabilitation Services Pager [phone] Office [phone]    Christine Garrett 01/09/2020, 12:35 PM

## 2020-01-10 LAB — GLUCOSE, CAPILLARY
Glucose-Capillary: 150 mg/dL — ABNORMAL HIGH (ref 70–99)
Glucose-Capillary: 118 mg/dL — ABNORMAL HIGH (ref 70–99)

## 2020-01-10 MED ORDER — OXYCODONE HCL 10 MG PO TABS: 5.0000 mg | ORAL_TABLET | Freq: Four times a day (QID) | ORAL | 0 refills | Status: AC | PRN

## 2020-01-10 MED ORDER — TIZANIDINE HCL 4 MG PO TABS
4.0000 mg | ORAL_TABLET | Freq: Four times a day (QID) | ORAL | 0 refills | Status: DC | PRN
Start: 2020-01-10 — End: 2020-06-26

## 2020-01-10 NOTE — Discharge Summary (Signed)
Physician Discharge Summary  Patient ID: Christine Garrett MRN: [id number] DOB/AGE: [birth year] 62 y.o.  Admit date: 01/05/2020 Discharge date: 01/10/2020  Admission Diagnoses:Lumbar spondylolisthesis Lumbar Two-Lumbar Three, Lumbar Three-Lumbar Four, Lumbar Four-Lumbar Five Lumbar spondylolisthesis L2/3,3/4,4/5   Discharge Diagnoses: Lumbar spondylolisthesis Lumbar Two-Lumbar Three, Lumbar Three-Lumbar Four, Lumbar Four-Lumbar Five Lumbar spondylolisthesis L2/3,3/4,4/5  Active Problems:   Spondylolisthesis of lumbar region   Discharged Condition: good  Hospital Course: Christine Garrett was admitted and taken to the operating room for a multilevel decompression and arthrodesis in the lumbar spine. Post op she had expected pain. She is voiding, ambulating, and tolerating a regular diet. Her wound is clean, dry, and without signs of infection  Treatments: surgery: Patient is 64 year old African-American female with multiple medical problems presents with a history of epigastric pain. She says the pain gets worse with meals. She reports sporadic nausea and vomiting but denies hematemesis or melena. Her daughter states she has lost 60 pounds in the lost 3-4 months. Patient states since she's been having this pain she is quit drinking alcohol. She generally would drink 6 cans of beer every day. She has been on chronic NSAIDs until a few weeks ago. She says she does not feel any better since stopping this medication. No history of pancreatitis. Patient has been under care of  Dr. Dorris Fetch regarding her diabetes. Her primary care physician Dr.Bluth died few weeks ago. She is getting her prescriptions from Dr. Scotty Court until she can find another physician.   Discharge Exam: Blood pressure (!) 160/84, pulse 88, temperature 98.7 F (37.1 C), resp. rate 17, height 5\' 9"  (1.753 m), weight 107.6 kg, SpO2 100 %. General appearance: alert, cooperative, appears stated age and mild distress  Disposition:  Discharge disposition: 01-Home or Self Care      Lumbar spondylolisthesis Lumbar Two-Lumbar Three, Lumbar Three-Lumbar Four, Lumbar Four-Lumbar Five  Allergies as of 01/10/2020      Reactions   Hydrocodone Other (See Comments)   Reeves  (Pt states she is not allergic to Hydrocodone, she was recently taking it) MENTAL STATUS CHANGE       Medication List    TAKE these medications   amLODipine 5 MG tablet Commonly known as: NORVASC Take 5 mg by mouth daily.   atorvastatin 40 MG tablet Commonly known as: LIPITOR Take 40 mg by mouth daily.   dicyclomine 10 MG capsule Commonly known as: Bentyl Take 1 capsule (10 mg total) by mouth 3 (three) times daily as needed for spasms.   Eliquis 5 MG Tabs tablet Generic drug: apixaban Take 1 tablet (5 mg total) by mouth 2 (two) times daily.   enalapril 20 MG tablet Commonly known as: VASOTEC Take 20 mg by mouth 2 (two) times daily.   Fish Oil 1000 MG Cpdr Take 1,000 mg by mouth daily. Omega 3 300 mg   gabapentin 300 MG capsule Commonly known as: NEURONTIN Take 600 mg by mouth 3 (three) times daily.   hydrALAZINE 25 MG tablet Commonly known as: APRESOLINE Take 25 mg by mouth 2 (two) times daily.   insulin degludec 100 UNIT/ML FlexTouch Pen Commonly known as: Tyler Aas FlexTouch Inject 0.5 mLs (50 Units total) into the skin daily at 10 pm.   Insulin Pen Needle 31G X 8 MM Misc Commonly known as: B-D ULTRAFINE III SHORT PEN 1 each by Does not apply route as directed.   metFORMIN 1000 MG tablet Commonly known as: GLUCOPHAGE Take 1,000 mg by mouth 2 (two) times daily.  methocarbamol 500 MG tablet Commonly known as: ROBAXIN Take 500 mg by mouth daily.   metoprolol succinate 50 MG 24 hr tablet Commonly known as: TOPROL-XL Take 50 mg by mouth daily. Take with or immediately following a meal.   Oxycodone HCl 10 MG Tabs Take 0.5-1 tablets (5-10 mg total) by mouth every 6 (six) hours as  needed for up to 11 days for severe pain ((score 7 to 10)).   potassium chloride 10 MEQ CR capsule Commonly known as: MICRO-K Take 10 mEq by mouth daily.   Revlimid 5 MG capsule Generic drug: lenalidomide TAKE 1 CAPSULE (5MG ) BY  MOUTH DAILY FOR 21 DAYS ON, THEN 7 DAYS OFF What changed: See the new instructions.   tiZANidine 4 MG tablet Commonly known as: ZANAFLEX Take 1 tablet (4 mg total) by mouth every 6 (six) hours as needed for muscle spasms.   Vitamin D (Ergocalciferol) 1.25 MG (50000 UNIT) Caps capsule Commonly known as: DRISDOL Take 50,000 Units by mouth every Monday.            Durable Medical Equipment  (From admission, onward)         Start     Ordered   01/06/20 1602  For home use only DME Shower stool  Once       Comments: Tub shower seat   01/06/20 1601   01/06/20 1557  For home use only DME 3 n 1  Once        01/06/20 1558   01/06/20 1556  For home use only DME Walker rolling  Once       Question Answer Comment  Walker: With Cascade Wheels   Patient needs a walker to treat with the following condition Weakness      01/06/20 1558          Follow-up Information    Ashok Pall, MD Follow up in 3 week(s).   Specialty: Neurosurgery Why: please call to make an appointment  Contact information: [address] [place] [address] [phone]               Signed: Ashok Pall 01/10/2020, 12:18 PM

## 2020-01-10 NOTE — Consult Note (Signed)
   [facility] Inpatient Consult   01/10/2020  Lamyra Malcolm January 17, 1956 [id number]  Patient chart reviewed for [facility] CM) services. Patient eligible for services under Medicare ACO plan.  Spoke with Ms. Stepp by telephone. HIPAA verified. Explained [facility] CM services to patient as community based partner with primary MD to assist with chronic disease management and assist with community social needs. Patient denies need for transportation to appointments or meals. States that she has a concern for costs of medication but will not know if it will be a problem until she fills prescriptions. Verbally consents to receive community follow up from [place] care manager.  Referral placed for [facility] CM follow up. Of note, [facility] Care Management services does not replace or interfere with any services that are arranged by inpatient case management or social work.  Netta Cedars, MSN, [facility] Liaison Nurse Mobile Phone [phone]  Toll free office [phone]

## 2020-01-10 NOTE — Discharge Instructions (Signed)

## 2020-01-10 NOTE — Progress Notes (Signed)
[name] to be D/C'd  per MD order. Discussed with the patient and all questions fully answered.  VSS, Skin clean, dry and intact without evidence of skin break down, no evidence of skin tears noted.  IV catheter discontinued intact. Site without signs and symptoms of complications. Dressing and pressure applied.  An After Visit Summary was printed and given to the patient. Patient received prescription.  D/c education completed with patient/family including follow up instructions, medication list, d/c activities limitations if indicated, with other d/c instructions as indicated by MD - patient able to verbalize understanding, all questions fully answered.   Patient instructed to return to ED, call 911, or call MD for any changes in condition.   Patient to be escorted via [place], and D/C home via private auto.

## 2020-01-10 NOTE — Progress Notes (Signed)
Physical Therapy Treatment Patient Details Name: Christine Garrett MRN: [id number] DOB: [birth year] Today's Date: 01/10/2020    History of Present Illness Pt is a 64 y.o. F iwth significant PMH of cancer, carpal tunnel syndrome, CHF, diabetes mellitus 2, T8 tumor resection, T6-10 posterior spinal fusion who presents with neurogenic claudication due to severe stenosis and facet arthropathy now s/p L3-5 PLIF with decompression.    PT Comments    Patient progressing slowly towards PT goals. Reports not feeling great today due to radicular symptoms into BLEs from buttocks and nausea. Improved ambulation distance with min guard assist for balance/safety. Distance limited due to pain. Able to recall 3/3 precautions but needs cues for upright during ambulation. Discussed need to attempt bed mobility later today with HOB flat and no rails to simulate home. Pt agreeable. Too painful at this time, RN present and administering pain medication upon departure. Will follow.    Follow Up Recommendations  Home health PT;Supervision for mobility/OOB     Equipment Recommendations  Rolling walker with 5" wheels;3in1 (PT)    Recommendations for Other Services       Precautions / Restrictions Precautions Precautions: Fall;Back Precaution Booklet Issued: No Precaution Comments: Able to state 3/3 precautions Required Braces or Orthoses: Spinal Brace Spinal Brace: Lumbar corset;Applied in sitting position Restrictions Weight Bearing Restrictions: No    Mobility  Bed Mobility               General bed mobility comments: Up in chair upon PT arrival. Discussed practicing bed mobility today without use of rail and with bed flat to simulate home, pt can grab head board on bed at home. Agreeable.  Transfers Overall transfer level: Needs assistance Equipment used: Rolling walker (2 wheeled) Transfers: Sit to/from Stand Sit to Stand: Supervision         General transfer comment: Supervision for  safety. Slow to rise due to pain in BLEs and back with transitions. Difficulty bringing UEs from chair onto walker.  Ambulation/Gait Ambulation/Gait assistance: Min guard Gait Distance (Feet): 150 Feet Assistive device: Rolling walker (2 wheeled) Gait Pattern/deviations: Step-through pattern;Decreased stride length;Trunk flexed Gait velocity: decreased Gait velocity interpretation: <1.31 ft/sec, indicative of household ambulator General Gait Details: Slow, guarded gait with cues to relax shoulders and for upright posture. Limited due to radicular pain.   Stairs             Wheelchair Mobility    Modified Rankin (Stroke Patients Only)       Balance Overall balance assessment: Needs assistance Sitting-balance support: Feet supported;No upper extremity supported Sitting balance-Leahy Scale: Good     Standing balance support: During functional activity Standing balance-Leahy Scale: Poor Standing balance comment: Reliant on external support mostly due to pain.                            Cognition Arousal/Alertness: Awake/alert Behavior During Therapy: WFL for tasks assessed/performed Overall Cognitive Status: Within Functional Limits for tasks assessed                                        Exercises      General Comments        Pertinent Vitals/Pain Pain Assessment: 0-10 Pain Score: 9  Pain Location: back with radicular symptoms into BLE's, RLE>LLE Pain Descriptors / Indicators: Radiating;Grimacing;Operative site guarding;Guarding Pain Intervention(s): Monitored during session;Repositioned;Limited activity within  patient's tolerance;Patient requesting pain meds-RN notified    Home Living                      Prior Function            PT Goals (current goals can now be found in the care plan section) Progress towards PT goals: Progressing toward goals (slowly)    Frequency    Min 5X/week      PT Plan Current  plan remains appropriate    Co-evaluation              AM-PAC PT "6 Clicks" Mobility   Outcome Measure  Help needed turning from your back to your side while in a flat bed without using bedrails?: None Help needed moving from lying on your back to sitting on the side of a flat bed without using bedrails?: A Little Help needed moving to and from a bed to a chair (including a wheelchair)?: A Little Help needed standing up from a chair using your arms (e.g., wheelchair or bedside chair)?: None Help needed to walk in hospital room?: A Little Help needed climbing 3-5 steps with a railing? : A Little 6 Click Score: 20    End of Session Equipment Utilized During Treatment: Gait belt;Back brace Activity Tolerance: Patient limited by pain;Patient tolerated treatment well Patient left: in chair;with call bell/phone within reach;with nursing/sitter in room Nurse Communication: Mobility status;Patient requests pain meds PT Visit Diagnosis: Pain;Difficulty in walking, not elsewhere classified (R26.2) Pain - part of body:  (back, LEs)     Time: 8527-7824 PT Time Calculation (min) (ACUTE ONLY): 20 min  Charges:  $Therapeutic Activity: 8-22 mins                     Marisa Severin, PT, DPT Acute Rehabilitation Services Pager [phone] Office [phone]       Marguarite Arbour A Tylyn Stankovich 01/10/2020, 11:09 AM

## 2020-01-10 NOTE — Progress Notes (Signed)
Occupational Therapy Treatment Patient Details Name: Christine Garrett MRN: [id number] DOB: June 12, 1956 Today's Date: 01/10/2020    History of present illness Pt is a 64 y.o. F iwth significant PMH of cancer, carpal tunnel syndrome, CHF, diabetes mellitus 2, T8 tumor resection, T6-10 posterior spinal fusion who presents with neurogenic claudication due to severe stenosis and facet arthropathy now s/p L3-5 PLIF with decompression.   OT comments  Pt d/c today and plans to go home. Pt performing LB pericare and ADL dressing routine with no physical assist required. Pt standing for tasks. Pt able to maintain back precautions well with no cues for proper technique. Pt reported no back pain, but occasional nausea today. OT following pt acutely.    Follow Up Recommendations  Home health OT    Equipment Recommendations  None recommended by OT    Recommendations for Other Services      Precautions / Restrictions Precautions Precautions: Fall;Back Precaution Booklet Issued: No Precaution Comments: Able to state 3/3 precautions Required Braces or Orthoses: Spinal Brace Spinal Brace: Lumbar corset;Applied in sitting position Restrictions Weight Bearing Restrictions: No       Mobility Bed Mobility Overal bed mobility: Modified Independent                Transfers Overall transfer level: Modified independent Equipment used: Rolling walker (2 wheeled)                  Balance Overall balance assessment: Modified Independent             Standing balance comment: Pt standing at sink with no UE supported                           ADL either performed or assessed with clinical judgement   ADL Overall ADL's : Needs assistance/impaired                 Upper Body Dressing : Supervision/safety;Standing   Lower Body Dressing: Supervision/safety;Sitting/lateral leans;Sit to/from stand   Toilet Transfer: Supervision/safety;Ambulation;RW   Toileting-  Clothing Manipulation and Hygiene: Supervision/safety;Cueing for safety;Sitting/lateral lean;Sit to/from stand       Functional mobility during ADLs: Supervision/safety;Rolling walker General ADL Comments: Pt d/c today and plans to go home. pt performing LB pericare and ADL dressing routine with no physical assist required. Pt standing for tasks. Pt able to maintain back precautions well with no cues for proper technique.     Vision   Vision Assessment?: No apparent visual deficits   Perception     Praxis      Cognition Arousal/Alertness: Awake/alert Behavior During Therapy: WFL for tasks assessed/performed Overall Cognitive Status: Within Functional Limits for tasks assessed                                          Exercises     Shoulder Instructions       General Comments      Pertinent Vitals/ Pain       Pain Assessment: No/denies pain Pain Intervention(s): Monitored during session  Home Living                                          Prior Functioning/Environment  Frequency  Min 2X/week        Progress Toward Goals  OT Goals(current goals can now be found in the care plan section)  Progress towards OT goals: Progressing toward goals  Acute Rehab OT Goals Patient Stated Goal: to go home OT Goal Formulation: With patient Time For Goal Achievement: 01/20/20 Potential to Achieve Goals: Good ADL Goals Pt Will Perform Grooming: with modified independence;standing Pt Will Perform Lower Body Bathing: with modified independence;sit to/from stand Pt Will Perform Lower Body Dressing: with modified independence;sit to/from stand Pt Will Transfer to Toilet: with modified independence;ambulating;bedside commode Pt Will Perform Toileting - Clothing Manipulation and hygiene: with modified independence;sit to/from stand Pt Will Perform Tub/Shower Transfer: with modified independence;ambulating;shower seat;Shower  transfer;rolling walker Additional ADL Goal #1: Pt will adhere to back precautions during ADL.  Plan Discharge plan remains appropriate    Co-evaluation                 AM-PAC OT "6 Clicks" Daily Activity     Outcome Measure   Help from another person eating meals?: None Help from another person taking care of personal grooming?: None Help from another person toileting, which includes using toliet, bedpan, or urinal?: None Help from another person bathing (including washing, rinsing, drying)?: A Little Help from another person to put on and taking off regular upper body clothing?: None Help from another person to put on and taking off regular lower body clothing?: None 6 Click Score: 23    End of Session Equipment Utilized During Treatment: Rolling walker;Back brace  OT Visit Diagnosis: Unsteadiness on feet (R26.81)   Activity Tolerance Patient tolerated treatment well   Patient Left in chair;with call bell/phone within reach   Nurse Communication Mobility status        Time: 0254-2706 OT Time Calculation (min): 17 min  Charges: OT General Charges $OT Visit: 1 Visit OT Treatments $Self Care/Home Management : 8-22 mins  Jefferey Pica, OTR/L Acute Rehabilitation Services Pager: [phone] Office: [phone]    Delorice Bannister C 01/10/2020, 4:38 PM

## 2020-01-11 DIAGNOSIS — E1151 Type 2 diabetes mellitus with diabetic peripheral angiopathy without gangrene: Secondary | ICD-10-CM | POA: Diagnosis not present

## 2020-01-11 DIAGNOSIS — G5600 Carpal tunnel syndrome, unspecified upper limb: Secondary | ICD-10-CM | POA: Diagnosis not present

## 2020-01-11 DIAGNOSIS — M48062 Spinal stenosis, lumbar region with neurogenic claudication: Secondary | ICD-10-CM | POA: Diagnosis not present

## 2020-01-11 DIAGNOSIS — Z794 Long term (current) use of insulin: Secondary | ICD-10-CM | POA: Diagnosis not present

## 2020-01-11 DIAGNOSIS — G4730 Sleep apnea, unspecified: Secondary | ICD-10-CM | POA: Diagnosis not present

## 2020-01-11 DIAGNOSIS — M170 Bilateral primary osteoarthritis of knee: Secondary | ICD-10-CM | POA: Diagnosis not present

## 2020-01-11 DIAGNOSIS — M4316 Spondylolisthesis, lumbar region: Secondary | ICD-10-CM | POA: Diagnosis not present

## 2020-01-11 DIAGNOSIS — E1140 Type 2 diabetes mellitus with diabetic neuropathy, unspecified: Secondary | ICD-10-CM | POA: Diagnosis not present

## 2020-01-11 DIAGNOSIS — C9031 Solitary plasmacytoma in remission: Secondary | ICD-10-CM | POA: Diagnosis not present

## 2020-01-11 DIAGNOSIS — Z4789 Encounter for other orthopedic aftercare: Secondary | ICD-10-CM | POA: Diagnosis not present

## 2020-01-11 DIAGNOSIS — C9000 Multiple myeloma not having achieved remission: Secondary | ICD-10-CM | POA: Diagnosis not present

## 2020-01-11 DIAGNOSIS — I509 Heart failure, unspecified: Secondary | ICD-10-CM | POA: Diagnosis not present

## 2020-01-11 DIAGNOSIS — K219 Gastro-esophageal reflux disease without esophagitis: Secondary | ICD-10-CM | POA: Diagnosis not present

## 2020-01-11 DIAGNOSIS — Z7901 Long term (current) use of anticoagulants: Secondary | ICD-10-CM | POA: Diagnosis not present

## 2020-01-11 DIAGNOSIS — Z87891 Personal history of nicotine dependence: Secondary | ICD-10-CM | POA: Diagnosis not present

## 2020-01-11 DIAGNOSIS — I110 Hypertensive heart disease with heart failure: Secondary | ICD-10-CM | POA: Diagnosis not present

## 2020-01-11 LAB — TYPE AND SCREEN
ABO/RH(D): B POS
Antibody Screen: NEGATIVE
Unit division: 0
Unit division: 0
Unit division: 0
Unit division: 0

## 2020-01-11 LAB — BPAM RBC
Blood Product Expiration Date: 202108152359
Blood Product Expiration Date: 202108182359
Blood Product Expiration Date: 202108182359
Blood Product Expiration Date: 202108182359
Unit Type and Rh: 7300
Unit Type and Rh: 7300
Unit Type and Rh: 7300
Unit Type and Rh: 7300

## 2020-01-11 MED FILL — Sodium Chloride IV Soln 0.9%: INTRAVENOUS | Qty: 1000 | Status: AC

## 2020-01-11 MED FILL — Heparin Sodium (Porcine) Inj 1000 Unit/ML: INTRAMUSCULAR | Qty: 30 | Status: AC

## 2020-01-14 ENCOUNTER — Other Ambulatory Visit: Payer: Self-pay

## 2020-01-14 DIAGNOSIS — Z4789 Encounter for other orthopedic aftercare: Secondary | ICD-10-CM | POA: Diagnosis not present

## 2020-01-14 DIAGNOSIS — I110 Hypertensive heart disease with heart failure: Secondary | ICD-10-CM | POA: Diagnosis not present

## 2020-01-14 DIAGNOSIS — I509 Heart failure, unspecified: Secondary | ICD-10-CM | POA: Diagnosis not present

## 2020-01-14 DIAGNOSIS — E1151 Type 2 diabetes mellitus with diabetic peripheral angiopathy without gangrene: Secondary | ICD-10-CM | POA: Diagnosis not present

## 2020-01-14 DIAGNOSIS — M4316 Spondylolisthesis, lumbar region: Secondary | ICD-10-CM | POA: Diagnosis not present

## 2020-01-14 DIAGNOSIS — M48062 Spinal stenosis, lumbar region with neurogenic claudication: Secondary | ICD-10-CM | POA: Diagnosis not present

## 2020-01-14 NOTE — Patient Outreach (Signed)
[facility] [facility]) Care Management  01/14/2020  Christine Garrett May 07, 1956 [id number]   New referral for hospital discharge follow up and red emmi;  Red emmi date: 01/12/2020   Reason for alert:   Scheduled follow up - no  New referral for hospital follow up new diagnosis of DM.  Placed call to patient with no response. Left a message requesting a call back.  PLAN: will follow up in 3 business day and mail an unsuccessful outreach letter.  Tomasa Rand, RN, BSN, CEN [facility] [employer] [phone]

## 2020-01-17 DIAGNOSIS — Z4789 Encounter for other orthopedic aftercare: Secondary | ICD-10-CM | POA: Diagnosis not present

## 2020-01-17 DIAGNOSIS — I509 Heart failure, unspecified: Secondary | ICD-10-CM | POA: Diagnosis not present

## 2020-01-17 DIAGNOSIS — I110 Hypertensive heart disease with heart failure: Secondary | ICD-10-CM | POA: Diagnosis not present

## 2020-01-17 DIAGNOSIS — E1151 Type 2 diabetes mellitus with diabetic peripheral angiopathy without gangrene: Secondary | ICD-10-CM | POA: Diagnosis not present

## 2020-01-17 DIAGNOSIS — M48062 Spinal stenosis, lumbar region with neurogenic claudication: Secondary | ICD-10-CM | POA: Diagnosis not present

## 2020-01-17 DIAGNOSIS — M4316 Spondylolisthesis, lumbar region: Secondary | ICD-10-CM | POA: Diagnosis not present

## 2020-01-18 ENCOUNTER — Other Ambulatory Visit: Payer: Self-pay

## 2020-01-18 ENCOUNTER — Ambulatory Visit: Payer: Medicare Other

## 2020-01-18 NOTE — Patient Outreach (Signed)
[facility]) Care Management  01/18/2020  Christine Garrett 1955/09/16 [id number]   Red emmi and new post hospital discharge referral:  Placed call to patient for 2nd outreach attempt with no answer. Left a message requesting a call back.  PLAN: will attempt again in 3 days.   Tomasa Rand, RN, BSN, CEN [facility] [employer] [phone]

## 2020-01-19 DIAGNOSIS — Z4789 Encounter for other orthopedic aftercare: Secondary | ICD-10-CM | POA: Diagnosis not present

## 2020-01-19 DIAGNOSIS — I110 Hypertensive heart disease with heart failure: Secondary | ICD-10-CM | POA: Diagnosis not present

## 2020-01-19 DIAGNOSIS — I509 Heart failure, unspecified: Secondary | ICD-10-CM | POA: Diagnosis not present

## 2020-01-19 DIAGNOSIS — E1151 Type 2 diabetes mellitus with diabetic peripheral angiopathy without gangrene: Secondary | ICD-10-CM | POA: Diagnosis not present

## 2020-01-19 DIAGNOSIS — M4316 Spondylolisthesis, lumbar region: Secondary | ICD-10-CM | POA: Diagnosis not present

## 2020-01-19 DIAGNOSIS — M48062 Spinal stenosis, lumbar region with neurogenic claudication: Secondary | ICD-10-CM | POA: Diagnosis not present

## 2020-01-21 ENCOUNTER — Ambulatory Visit: Payer: Self-pay

## 2020-01-21 DIAGNOSIS — I509 Heart failure, unspecified: Secondary | ICD-10-CM | POA: Diagnosis not present

## 2020-01-21 DIAGNOSIS — E1151 Type 2 diabetes mellitus with diabetic peripheral angiopathy without gangrene: Secondary | ICD-10-CM | POA: Diagnosis not present

## 2020-01-21 DIAGNOSIS — M4316 Spondylolisthesis, lumbar region: Secondary | ICD-10-CM | POA: Diagnosis not present

## 2020-01-21 DIAGNOSIS — I110 Hypertensive heart disease with heart failure: Secondary | ICD-10-CM | POA: Diagnosis not present

## 2020-01-21 DIAGNOSIS — Z4789 Encounter for other orthopedic aftercare: Secondary | ICD-10-CM | POA: Diagnosis not present

## 2020-01-21 DIAGNOSIS — M48062 Spinal stenosis, lumbar region with neurogenic claudication: Secondary | ICD-10-CM | POA: Diagnosis not present

## 2020-01-24 ENCOUNTER — Other Ambulatory Visit: Payer: Self-pay

## 2020-01-24 DIAGNOSIS — M48062 Spinal stenosis, lumbar region with neurogenic claudication: Secondary | ICD-10-CM | POA: Diagnosis not present

## 2020-01-24 DIAGNOSIS — E1151 Type 2 diabetes mellitus with diabetic peripheral angiopathy without gangrene: Secondary | ICD-10-CM | POA: Diagnosis not present

## 2020-01-24 DIAGNOSIS — I110 Hypertensive heart disease with heart failure: Secondary | ICD-10-CM | POA: Diagnosis not present

## 2020-01-24 DIAGNOSIS — Z4789 Encounter for other orthopedic aftercare: Secondary | ICD-10-CM | POA: Diagnosis not present

## 2020-01-24 DIAGNOSIS — M4316 Spondylolisthesis, lumbar region: Secondary | ICD-10-CM | POA: Diagnosis not present

## 2020-01-24 DIAGNOSIS — I509 Heart failure, unspecified: Secondary | ICD-10-CM | POA: Diagnosis not present

## 2020-01-24 NOTE — Patient Outreach (Addendum)
[place] [facility]) Care Management  01/24/2020  [name] Mar 11, 1956 [id number]   Red emmi and new referral for DM.  Placed call to patient with no answer.   PLAN: will plan case closure on 01/28/2020   Tomasa Rand, RN, BSN, CEN [facility] Coordinator [phone]

## 2020-01-25 DIAGNOSIS — M4316 Spondylolisthesis, lumbar region: Secondary | ICD-10-CM | POA: Diagnosis not present

## 2020-01-25 DIAGNOSIS — Z4789 Encounter for other orthopedic aftercare: Secondary | ICD-10-CM | POA: Diagnosis not present

## 2020-01-25 DIAGNOSIS — I509 Heart failure, unspecified: Secondary | ICD-10-CM | POA: Diagnosis not present

## 2020-01-25 DIAGNOSIS — I110 Hypertensive heart disease with heart failure: Secondary | ICD-10-CM | POA: Diagnosis not present

## 2020-01-25 DIAGNOSIS — E1151 Type 2 diabetes mellitus with diabetic peripheral angiopathy without gangrene: Secondary | ICD-10-CM | POA: Diagnosis not present

## 2020-01-25 DIAGNOSIS — M48062 Spinal stenosis, lumbar region with neurogenic claudication: Secondary | ICD-10-CM | POA: Diagnosis not present

## 2020-01-26 DIAGNOSIS — M48062 Spinal stenosis, lumbar region with neurogenic claudication: Secondary | ICD-10-CM | POA: Diagnosis not present

## 2020-01-26 DIAGNOSIS — E1151 Type 2 diabetes mellitus with diabetic peripheral angiopathy without gangrene: Secondary | ICD-10-CM | POA: Diagnosis not present

## 2020-01-26 DIAGNOSIS — M4316 Spondylolisthesis, lumbar region: Secondary | ICD-10-CM | POA: Diagnosis not present

## 2020-01-26 DIAGNOSIS — Z4789 Encounter for other orthopedic aftercare: Secondary | ICD-10-CM | POA: Diagnosis not present

## 2020-01-26 DIAGNOSIS — I110 Hypertensive heart disease with heart failure: Secondary | ICD-10-CM | POA: Diagnosis not present

## 2020-01-26 DIAGNOSIS — I509 Heart failure, unspecified: Secondary | ICD-10-CM | POA: Diagnosis not present

## 2020-01-27 DIAGNOSIS — E1151 Type 2 diabetes mellitus with diabetic peripheral angiopathy without gangrene: Secondary | ICD-10-CM | POA: Diagnosis not present

## 2020-01-27 DIAGNOSIS — Z4789 Encounter for other orthopedic aftercare: Secondary | ICD-10-CM | POA: Diagnosis not present

## 2020-01-27 DIAGNOSIS — M48062 Spinal stenosis, lumbar region with neurogenic claudication: Secondary | ICD-10-CM | POA: Diagnosis not present

## 2020-01-27 DIAGNOSIS — I509 Heart failure, unspecified: Secondary | ICD-10-CM | POA: Diagnosis not present

## 2020-01-27 DIAGNOSIS — I110 Hypertensive heart disease with heart failure: Secondary | ICD-10-CM | POA: Diagnosis not present

## 2020-01-27 DIAGNOSIS — M4316 Spondylolisthesis, lumbar region: Secondary | ICD-10-CM | POA: Diagnosis not present

## 2020-01-28 ENCOUNTER — Other Ambulatory Visit: Payer: Self-pay

## 2020-01-28 NOTE — Patient Outreach (Signed)
[facility]) Care Management  01/28/2020  Nastashia Gallo 01-07-1956 [id number]   Case closure:  No response to telephone or letter outreach attempts.  PLAN: case closure.  Tomasa Rand, RN, BSN, CEN [facility] [employer] [phone]

## 2020-01-31 DIAGNOSIS — D492 Neoplasm of unspecified behavior of bone, soft tissue, and skin: Secondary | ICD-10-CM | POA: Diagnosis not present

## 2020-02-01 DIAGNOSIS — E1151 Type 2 diabetes mellitus with diabetic peripheral angiopathy without gangrene: Secondary | ICD-10-CM | POA: Diagnosis not present

## 2020-02-01 DIAGNOSIS — I509 Heart failure, unspecified: Secondary | ICD-10-CM | POA: Diagnosis not present

## 2020-02-01 DIAGNOSIS — I110 Hypertensive heart disease with heart failure: Secondary | ICD-10-CM | POA: Diagnosis not present

## 2020-02-01 DIAGNOSIS — M4316 Spondylolisthesis, lumbar region: Secondary | ICD-10-CM | POA: Diagnosis not present

## 2020-02-01 DIAGNOSIS — M48062 Spinal stenosis, lumbar region with neurogenic claudication: Secondary | ICD-10-CM | POA: Diagnosis not present

## 2020-02-01 DIAGNOSIS — Z4789 Encounter for other orthopedic aftercare: Secondary | ICD-10-CM | POA: Diagnosis not present

## 2020-02-03 DIAGNOSIS — Z4789 Encounter for other orthopedic aftercare: Secondary | ICD-10-CM | POA: Diagnosis not present

## 2020-02-03 DIAGNOSIS — I509 Heart failure, unspecified: Secondary | ICD-10-CM | POA: Diagnosis not present

## 2020-02-03 DIAGNOSIS — M48062 Spinal stenosis, lumbar region with neurogenic claudication: Secondary | ICD-10-CM | POA: Diagnosis not present

## 2020-02-03 DIAGNOSIS — M4316 Spondylolisthesis, lumbar region: Secondary | ICD-10-CM | POA: Diagnosis not present

## 2020-02-03 DIAGNOSIS — I110 Hypertensive heart disease with heart failure: Secondary | ICD-10-CM | POA: Diagnosis not present

## 2020-02-03 DIAGNOSIS — E1151 Type 2 diabetes mellitus with diabetic peripheral angiopathy without gangrene: Secondary | ICD-10-CM | POA: Diagnosis not present

## 2020-02-08 DIAGNOSIS — Z86711 Personal history of pulmonary embolism: Secondary | ICD-10-CM | POA: Diagnosis not present

## 2020-02-08 DIAGNOSIS — M4807 Spinal stenosis, lumbosacral region: Secondary | ICD-10-CM | POA: Diagnosis not present

## 2020-02-08 DIAGNOSIS — I10 Essential (primary) hypertension: Secondary | ICD-10-CM | POA: Diagnosis not present

## 2020-02-09 DIAGNOSIS — M48062 Spinal stenosis, lumbar region with neurogenic claudication: Secondary | ICD-10-CM | POA: Diagnosis not present

## 2020-02-09 DIAGNOSIS — I509 Heart failure, unspecified: Secondary | ICD-10-CM | POA: Diagnosis not present

## 2020-02-09 DIAGNOSIS — I110 Hypertensive heart disease with heart failure: Secondary | ICD-10-CM | POA: Diagnosis not present

## 2020-02-09 DIAGNOSIS — Z4789 Encounter for other orthopedic aftercare: Secondary | ICD-10-CM | POA: Diagnosis not present

## 2020-02-09 DIAGNOSIS — E1151 Type 2 diabetes mellitus with diabetic peripheral angiopathy without gangrene: Secondary | ICD-10-CM | POA: Diagnosis not present

## 2020-02-09 DIAGNOSIS — M4316 Spondylolisthesis, lumbar region: Secondary | ICD-10-CM | POA: Diagnosis not present

## 2020-02-10 DIAGNOSIS — Z87891 Personal history of nicotine dependence: Secondary | ICD-10-CM | POA: Diagnosis not present

## 2020-02-10 DIAGNOSIS — Z4789 Encounter for other orthopedic aftercare: Secondary | ICD-10-CM | POA: Diagnosis not present

## 2020-02-10 DIAGNOSIS — C9031 Solitary plasmacytoma in remission: Secondary | ICD-10-CM | POA: Diagnosis not present

## 2020-02-10 DIAGNOSIS — I110 Hypertensive heart disease with heart failure: Secondary | ICD-10-CM | POA: Diagnosis not present

## 2020-02-10 DIAGNOSIS — G4730 Sleep apnea, unspecified: Secondary | ICD-10-CM | POA: Diagnosis not present

## 2020-02-10 DIAGNOSIS — E1140 Type 2 diabetes mellitus with diabetic neuropathy, unspecified: Secondary | ICD-10-CM | POA: Diagnosis not present

## 2020-02-10 DIAGNOSIS — M170 Bilateral primary osteoarthritis of knee: Secondary | ICD-10-CM | POA: Diagnosis not present

## 2020-02-10 DIAGNOSIS — Z7901 Long term (current) use of anticoagulants: Secondary | ICD-10-CM | POA: Diagnosis not present

## 2020-02-10 DIAGNOSIS — M48062 Spinal stenosis, lumbar region with neurogenic claudication: Secondary | ICD-10-CM | POA: Diagnosis not present

## 2020-02-10 DIAGNOSIS — C9000 Multiple myeloma not having achieved remission: Secondary | ICD-10-CM | POA: Diagnosis not present

## 2020-02-10 DIAGNOSIS — G5600 Carpal tunnel syndrome, unspecified upper limb: Secondary | ICD-10-CM | POA: Diagnosis not present

## 2020-02-10 DIAGNOSIS — K219 Gastro-esophageal reflux disease without esophagitis: Secondary | ICD-10-CM | POA: Diagnosis not present

## 2020-02-10 DIAGNOSIS — E1151 Type 2 diabetes mellitus with diabetic peripheral angiopathy without gangrene: Secondary | ICD-10-CM | POA: Diagnosis not present

## 2020-02-10 DIAGNOSIS — Z794 Long term (current) use of insulin: Secondary | ICD-10-CM | POA: Diagnosis not present

## 2020-02-10 DIAGNOSIS — I509 Heart failure, unspecified: Secondary | ICD-10-CM | POA: Diagnosis not present

## 2020-02-10 DIAGNOSIS — M4316 Spondylolisthesis, lumbar region: Secondary | ICD-10-CM | POA: Diagnosis not present

## 2020-02-14 DIAGNOSIS — I10 Essential (primary) hypertension: Secondary | ICD-10-CM | POA: Diagnosis not present

## 2020-02-14 DIAGNOSIS — E7800 Pure hypercholesterolemia, unspecified: Secondary | ICD-10-CM | POA: Diagnosis not present

## 2020-02-14 DIAGNOSIS — I5032 Chronic diastolic (congestive) heart failure: Secondary | ICD-10-CM | POA: Diagnosis not present

## 2020-02-14 DIAGNOSIS — I2510 Atherosclerotic heart disease of native coronary artery without angina pectoris: Secondary | ICD-10-CM | POA: Diagnosis not present

## 2020-02-15 DIAGNOSIS — Z4789 Encounter for other orthopedic aftercare: Secondary | ICD-10-CM | POA: Diagnosis not present

## 2020-02-15 DIAGNOSIS — M48062 Spinal stenosis, lumbar region with neurogenic claudication: Secondary | ICD-10-CM | POA: Diagnosis not present

## 2020-02-15 DIAGNOSIS — M4316 Spondylolisthesis, lumbar region: Secondary | ICD-10-CM | POA: Diagnosis not present

## 2020-02-15 DIAGNOSIS — E1151 Type 2 diabetes mellitus with diabetic peripheral angiopathy without gangrene: Secondary | ICD-10-CM | POA: Diagnosis not present

## 2020-02-15 DIAGNOSIS — I509 Heart failure, unspecified: Secondary | ICD-10-CM | POA: Diagnosis not present

## 2020-02-15 DIAGNOSIS — I110 Hypertensive heart disease with heart failure: Secondary | ICD-10-CM | POA: Diagnosis not present

## 2020-02-22 DIAGNOSIS — Z4789 Encounter for other orthopedic aftercare: Secondary | ICD-10-CM | POA: Diagnosis not present

## 2020-02-22 DIAGNOSIS — M48062 Spinal stenosis, lumbar region with neurogenic claudication: Secondary | ICD-10-CM | POA: Diagnosis not present

## 2020-02-22 DIAGNOSIS — I509 Heart failure, unspecified: Secondary | ICD-10-CM | POA: Diagnosis not present

## 2020-02-22 DIAGNOSIS — M4316 Spondylolisthesis, lumbar region: Secondary | ICD-10-CM | POA: Diagnosis not present

## 2020-02-22 DIAGNOSIS — I110 Hypertensive heart disease with heart failure: Secondary | ICD-10-CM | POA: Diagnosis not present

## 2020-02-22 DIAGNOSIS — E1151 Type 2 diabetes mellitus with diabetic peripheral angiopathy without gangrene: Secondary | ICD-10-CM | POA: Diagnosis not present

## 2020-03-01 DIAGNOSIS — I509 Heart failure, unspecified: Secondary | ICD-10-CM | POA: Diagnosis not present

## 2020-03-01 DIAGNOSIS — Z4789 Encounter for other orthopedic aftercare: Secondary | ICD-10-CM | POA: Diagnosis not present

## 2020-03-01 DIAGNOSIS — I110 Hypertensive heart disease with heart failure: Secondary | ICD-10-CM | POA: Diagnosis not present

## 2020-03-01 DIAGNOSIS — M48062 Spinal stenosis, lumbar region with neurogenic claudication: Secondary | ICD-10-CM | POA: Diagnosis not present

## 2020-03-01 DIAGNOSIS — M4316 Spondylolisthesis, lumbar region: Secondary | ICD-10-CM | POA: Diagnosis not present

## 2020-03-01 DIAGNOSIS — E1151 Type 2 diabetes mellitus with diabetic peripheral angiopathy without gangrene: Secondary | ICD-10-CM | POA: Diagnosis not present

## 2020-03-03 ENCOUNTER — Inpatient Hospital Stay: Payer: Medicare Other

## 2020-03-03 ENCOUNTER — Inpatient Hospital Stay: Payer: Medicare Other | Admitting: Hematology

## 2020-03-16 ENCOUNTER — Other Ambulatory Visit: Payer: Self-pay

## 2020-03-16 ENCOUNTER — Inpatient Hospital Stay (HOSPITAL_BASED_OUTPATIENT_CLINIC_OR_DEPARTMENT_OTHER): Payer: Medicare Other | Admitting: Hematology

## 2020-03-16 ENCOUNTER — Inpatient Hospital Stay: Payer: Medicare Other | Attending: Hematology

## 2020-03-16 ENCOUNTER — Inpatient Hospital Stay: Payer: Medicare Other

## 2020-03-16 VITALS — BP 203/85 | HR 85 | Temp 97.9°F | Resp 18 | Ht 69.0 in | Wt 220.7 lb

## 2020-03-16 DIAGNOSIS — C9001 Multiple myeloma in remission: Secondary | ICD-10-CM | POA: Insufficient documentation

## 2020-03-16 DIAGNOSIS — E559 Vitamin D deficiency, unspecified: Secondary | ICD-10-CM

## 2020-03-16 DIAGNOSIS — Z95828 Presence of other vascular implants and grafts: Secondary | ICD-10-CM

## 2020-03-16 LAB — CBC WITH DIFFERENTIAL/PLATELET
Abs Immature Granulocytes: 0.01 10*3/uL (ref 0.00–0.07)
Basophils Absolute: 0 10*3/uL (ref 0.0–0.1)
Basophils Relative: 1 %
Eosinophils Absolute: 0.2 10*3/uL (ref 0.0–0.5)
Eosinophils Relative: 3 %
HCT: 34 % — ABNORMAL LOW (ref 36.0–46.0)
Hemoglobin: 11.1 g/dL — ABNORMAL LOW (ref 12.0–15.0)
Immature Granulocytes: 0 %
Lymphocytes Relative: 38 %
Lymphs Abs: 2.5 10*3/uL (ref 0.7–4.0)
MCH: 29.2 pg (ref 26.0–34.0)
MCHC: 32.6 g/dL (ref 30.0–36.0)
MCV: 89.5 fL (ref 80.0–100.0)
Monocytes Absolute: 0.4 10*3/uL (ref 0.1–1.0)
Monocytes Relative: 6 %
Neutro Abs: 3.5 10*3/uL (ref 1.7–7.7)
Neutrophils Relative %: 52 %
Platelets: 240 10*3/uL (ref 150–400)
RBC: 3.8 MIL/uL — ABNORMAL LOW (ref 3.87–5.11)
RDW: 15 % (ref 11.5–15.5)
WBC: 6.6 10*3/uL (ref 4.0–10.5)
nRBC: 0 % (ref 0.0–0.2)

## 2020-03-16 LAB — CMP (CANCER CENTER ONLY)
ALT: 15 U/L (ref 0–44)
AST: 15 U/L (ref 15–41)
Albumin: 3.6 g/dL (ref 3.5–5.0)
Alkaline Phosphatase: 113 U/L (ref 38–126)
Anion gap: 7 (ref 5–15)
BUN: 8 mg/dL (ref 8–23)
CO2: 26 mmol/L (ref 22–32)
Calcium: 10 mg/dL (ref 8.9–10.3)
Chloride: 101 mmol/L (ref 98–111)
Creatinine: 1.19 mg/dL — ABNORMAL HIGH (ref 0.44–1.00)
GFR, Est AFR Am: 57 mL/min — ABNORMAL LOW
GFR, Estimated: 49 mL/min — ABNORMAL LOW
Glucose, Bld: 476 mg/dL — ABNORMAL HIGH (ref 70–99)
Potassium: 4 mmol/L (ref 3.5–5.1)
Sodium: 134 mmol/L — ABNORMAL LOW (ref 135–145)
Total Bilirubin: 0.4 mg/dL (ref 0.3–1.2)
Total Protein: 7.3 g/dL (ref 6.5–8.1)

## 2020-03-16 LAB — VITAMIN D 25 HYDROXY (VIT D DEFICIENCY, FRACTURES): Vit D, 25-Hydroxy: 28.45 ng/mL — ABNORMAL LOW (ref 30–100)

## 2020-03-16 MED ORDER — SODIUM CHLORIDE 0.9% FLUSH
10.0000 mL | Freq: Once | INTRAVENOUS | Status: AC
  Administered 2020-03-16: 10 mL
  Filled 2020-03-16: qty 10

## 2020-03-16 MED ORDER — HEPARIN SOD (PORK) LOCK FLUSH 100 UNIT/ML IV SOLN
500.0000 [IU] | Freq: Once | INTRAVENOUS | Status: AC
  Administered 2020-03-16: 500 [IU]
  Filled 2020-03-16: qty 5

## 2020-03-16 NOTE — Progress Notes (Signed)
HEMATOLOGY/ONCOLOGY CLINIC NOTE  Date of Service: 03/16/2020  Patient Care Team: Neale Burly, MD as PCP - General (Internal Medicine) Alda Berthold, DO as Consulting Physician (Neurology)  CHIEF COMPLAINTS/PURPOSE OF CONSULTATION:  F/u for continued mx of Multiple myeloma  Oncologic History:  64 y.o. female who initially presented with significant weight loss in the context of H. pylori-associated gastritis and alcohol abuse. At the same time, an incidental discovery of a lower thoracic vertebral mass in the context of chronic numbness and weakness in the lower extremities. MRI of the thoracic spine demonstrated a destructive lesion at T8 level and patient underwent surgical treatment. 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 Staging evaluation conducted by our clinic confirmed presence of IgG kappa multiple myeloma. Cytogenetics studies demonstrated normal karyotype, FISH studies are positive for 13q34 & D13S319 loss and no loss of p53. Based on initial information, patient was staged at R-ISS II.   Patient has completed 6 cycles of induction systemic chemotherapy with lenalidomide, bortezomib, and low-dose dexamethasone.  Repeat bone marrow biopsy demonstrated excellent response to treatment and patient subsequently underwent consolidation therapy including melphalan conditioning and autologous stem cell rescue.  Repeat assessment with PET/CT and bone marrow biopsy on 10/13/2017 demonstrates stringent complete response maintenance.  HISTORY OF PRESENTING ILLNESS:   Christine Garrett is a wonderful 64 y.o. female who has been referred to [place] by my colleague Dr Grace Isaac for evaluation and management of Multiple myeloma. The pt reports that she is doing well overall.   The pt has previously been followed by my colleague Dr Grace Isaac and completed 6 cycles of induction with Revlimid, Velcade and dexamethasone. She had a autologous PBSCT on  07/01/17. She is on lenalidomide maintenance currently.   The pt reports that she has had a headache for the past week, noting a constant pounding in the front of her forehead. She denies fevers and is always cold. She is having clear, nasal discharge and seasonal allergies. She notes aspirin has alleviated her headache some.   She is continuing to take Revlimid on 21 day cycles.  Most recent lab results (11/21/17) of CBC and CMP is as follows: all values are WNL.  MMP 11/21/17 is shows no M spike   On review of systems, pt reports headache, moving her bowels well, increasing strength, and denies mouth sores, fevers, night sweats, and any other symptoms.   INTERVAL HISTORY:  Christine Garrett returns today for management and evaluation of her Multiple Myeloma currently in remission. The patient's last visit with [place] was on 12/02/2019. The pt reports that she is doing well overall.  The pt reports no acute new symptoms. She has been off Revlimid around her recent spinal surgery  Lab results today (03/16/20) of CBC w/diff and CMP is as follows: all values are WNL except for RBC at 3.80, Hgb at 11.1, HCT at 34.0, Sodium at 134, Glucose at 476, Creatinine at 1.19, GFR Est Afr Am at 57. 03/16/2020 Vitamin D 25 (OH) at 28.45 03/16/2020 MMP is in progress  MEDICAL HISTORY:  Past Medical History:  Diagnosis Date  . Arthritis    knees  . Cancer ([place])   . Carpal tunnel syndrome   . CHF (congestive heart failure) ([place])   . Diabetes mellitus    type 2  . GERD (gastroesophageal reflux disease)   . Heart murmur    "Little, No concerns" per Dr  Dannielle Burn pt reported.  . Hypertension    controlled using  a guided approch with plasma renin activity  . Multiple myeloma not having achieved remission ([place]) 01/06/2017  . Neuropathy   . Peripheral vascular disease ([place])   . Sleep apnea    01/03/2020: per patient, hasn't used CPAP machine in two years due to inability to breathe well with it on    SURGICAL  HISTORY: Past Surgical History:  Procedure Laterality Date  . BIOPSY  11/28/2016   Procedure: BIOPSY;  Surgeon: Rogene Houston, MD;  [facility];  Service: Endoscopy;;  gastric  . BIOPSY  07/02/2019   Procedure: BIOPSY;  Surgeon: Rogene Houston, MD;  [facility];  Service: Endoscopy;;  . [place]  . CERVICAL CONE BIOPSY     cervical lesion  . COLONOSCOPY WITH PROPOFOL N/A 07/02/2019   Procedure: COLONOSCOPY WITH PROPOFOL;  Surgeon: Rogene Houston, MD;  [facility];  Service: Endoscopy;  Laterality: N/A;  12:40 - pt can't come earlier due to transportation  . ESOPHAGOGASTRODUODENOSCOPY (EGD) WITH PROPOFOL N/A 11/28/2016   Procedure: ESOPHAGOGASTRODUODENOSCOPY (EGD) WITH PROPOFOL;  Surgeon: Rogene Houston, MD;  [facility];  Service: Endoscopy;  Laterality: N/A;  9:25  . IR FLUORO GUIDE PORT INSERTION RIGHT  01/29/2017  . IR US GUIDE VASC ACCESS RIGHT  01/29/2017  . lipoma removal     right shoulder 2001  . LUMBAR FUSION  01/05/2020    Lumbar Two-Three Lumbar Three-Four Lumbar Four-Five Posterior lumbar interbody fusion with decompression (N/A Spine Lumbar)  . MULTIPLE EXTRACTIONS WITH ALVEOLOPLASTY Bilateral 08/24/2012   Procedure: MULTIPLE EXTRACTION WITH ALVEOLOPLASTY BIOPSY OF RIGHT AND LEFT MANDIBLE ;  Surgeon: Gae Bon, DDS;  Location: [facility];  Service: Oral Surgery;  Laterality: Bilateral;  . POLYPECTOMY  07/02/2019   Procedure: POLYPECTOMY;  Surgeon: Rogene Houston, MD;  [facility];  Service: Endoscopy;;  . POSTERIOR LUMBAR FUSION 4 LEVEL N/A 12/26/2016   Procedure: THORACIC EIGHT TUMOR RESECTION, THORACIC SIX- THORACIC TEN POSTERIOR SPINAL FUSION;  Surgeon: Ashok Pall, MD;  Location: [facility];  Service: Neurosurgery;  Laterality: N/A;  THORACIC 8 TUMOR RESECTION, THORACIC 6- THORACIC 10 POSTERIOR SPINAL FUSION  . ROTATOR CUFF REPAIR     right shoulder  . TUBAL LIGATION      SOCIAL HISTORY: Social History    Socioeconomic History  . Marital status: Single    Spouse name: Not on file  . Number of children: 3  . Years of education: Not on file  . Highest education level: Not on file  Occupational History  . Occupation: on disability  . Occupation: former Quarry manager  Tobacco Use  . Smoking status: Former Smoker    Packs/day: 0.50    Years: 6.00    Pack years: 3.00    Types: Cigarettes    Quit date: 2019    Years since quitting: 2.7  . Smokeless tobacco: Never Used  Vaping Use  . Vaping Use: Never used  Substance and Sexual Activity  . Alcohol use: No    Alcohol/week: 6.0 standard drinks    Types: 6 Cans of beer per week  . Drug use: No  . Sexual activity: Not Currently    Birth control/protection: Post-menopausal  Other Topics Concern  . Not on file  Social History Narrative   Lives with son, daughter and grandkids in a 2 story home but stays on the first floor.  Has 3 children.  On disability since ~ 2006 for back issues but  did work as a Quarry manager.      Right handed   Social Determinants of Health   Financial Resource Strain:   . Difficulty of Paying Living Expenses: Not on file  Food Insecurity:   . Worried About Charity fundraiser in the Last Year: Not on file  . Ran Out of Food in the Last Year: Not on file  Transportation Needs:   . Lack of Transportation (Medical): Not on file  . Lack of Transportation (Non-Medical): Not on file  Physical Activity:   . Days of Exercise per Week: Not on file  . Minutes of Exercise per Session: Not on file  Stress:   . Feeling of Stress : Not on file  Social Connections:   . Frequency of Communication with Friends and Family: Not on file  . Frequency of Social Gatherings with Friends and Family: Not on file  . Attends Religious Services: Not on file  . Active Member of Clubs or Organizations: Not on file  . Attends Archivist Meetings: Not on file  . Marital Status: Not on file  Intimate Partner Violence:   . Fear of Current  or Ex-Partner: Not on file  . Emotionally Abused: Not on file  . Physically Abused: Not on file  . Sexually Abused: Not on file    FAMILY HISTORY: Family History  Problem Relation Age of Onset  . Diabetes Mother   . Hypertension Mother   . Heart disease Mother   . Alzheimer's disease Mother   . Diabetes Father   . Heart disease Sister   . Diabetes Sister     ALLERGIES:  is allergic to hydrocodone.  MEDICATIONS:  Current Outpatient Medications  Medication Sig Dispense Refill  . amLODipine (NORVASC) 5 MG tablet Take 5 mg by mouth daily.    Marland Kitchen apixaban (ELIQUIS) 5 MG TABS tablet Take 1 tablet (5 mg total) by mouth 2 (two) times daily.    Marland Kitchen atorvastatin (LIPITOR) 40 MG tablet Take 40 mg by mouth daily.    Marland Kitchen dicyclomine (BENTYL) 10 MG capsule Take 1 capsule (10 mg total) by mouth 3 (three) times daily as needed for spasms. 60 capsule 1  . enalapril (VASOTEC) 20 MG tablet Take 20 mg by mouth 2 (two) times daily.     Marland Kitchen gabapentin (NEURONTIN) 300 MG capsule Take 600 mg by mouth 3 (three) times daily.    . hydrALAZINE (APRESOLINE) 25 MG tablet Take 25 mg by mouth 2 (two) times daily.    . insulin degludec (TRESIBA FLEXTOUCH) 100 UNIT/ML SOPN FlexTouch Pen Inject 0.5 mLs (50 Units total) into the skin daily at 10 pm. 5 pen 2  . Insulin Pen Needle (B-D ULTRAFINE III SHORT PEN) 31G X 8 MM MISC 1 each by Does not apply route as directed. 100 each 3  . metFORMIN (GLUCOPHAGE) 1000 MG tablet Take 1,000 mg by mouth 2 (two) times daily.     . methocarbamol (ROBAXIN) 500 MG tablet Take 500 mg by mouth daily.    . metoprolol succinate (TOPROL-XL) 50 MG 24 hr tablet Take 50 mg by mouth daily. Take with or immediately following a meal.    . Omega-3 Fatty Acids (FISH OIL) 1000 MG CPDR Take 1,000 mg by mouth daily. Omega 3 300 mg    . potassium chloride (MICRO-K) 10 MEQ CR capsule Take 10 mEq by mouth daily.    Marland Kitchen REVLIMID 5 MG capsule TAKE 1 CAPSULE (5MG) BY  MOUTH DAILY FOR 21 DAYS ON,  THEN 7 DAYS OFF  (Patient taking differently: Take 5 mg by mouth See admin instructions. TAKE 1 CAPSULE (5MG) BY  MOUTH DAILY FOR 21 DAYS ON, THEN 7 DAYS OFF) 21 capsule 0  . tiZANidine (ZANAFLEX) 4 MG tablet Take 1 tablet (4 mg total) by mouth every 6 (six) hours as needed for muscle spasms. 60 tablet 0  . Vitamin D, Ergocalciferol, (DRISDOL) 50000 units CAPS capsule Take 50,000 Units by mouth every Monday.      No current facility-administered medications for this visit.    REVIEW OF SYSTEMS:   A 10+ POINT REVIEW OF SYSTEMS WAS OBTAINED including neurology, dermatology, psychiatry, cardiac, respiratory, lymph, extremities, GI, GU, Musculoskeletal, constitutional, breasts, reproductive, HEENT.  All pertinent positives are noted in the HPI.  All others are negative.   PHYSICAL EXAMINATION: ECOG PERFORMANCE STATUS: 1 - Symptomatic but completely ambulatory   Vitals:   03/16/20 1343  BP: (!) 203/85  Pulse: 85  Resp: 18  Temp: 97.9 F (36.6 C)  SpO2: 100%   Filed Weights   03/16/20 1343  Weight: 220 lb 11.2 oz (100.1 kg)   Body mass index is 32.59 kg/m.  NAD GENERAL:alert, in no acute distress and comfortable SKIN: no acute rashes, no significant lesions EYES: conjunctiva are pink and non-injected, sclera anicteric OROPHARYNX: MMM, no exudates, no oropharyngeal erythema or ulceration NECK: supple, no JVD LYMPH:  no palpable lymphadenopathy in the cervical, axillary or inguinal regions LUNGS: clear to auscultation b/l with normal respiratory effort HEART: regular rate & rhythm ABDOMEN:  normoactive bowel sounds , non tender, not distended. No palpable hepatosplenomegaly.  Extremity: no pedal edema PSYCH: alert & oriented x 3 with fluent speech NEURO: no focal motor/sensory deficits  LABORATORY DATA:  I have reviewed the data as listed  . CBC Latest Ref Rng & Units 01/05/2020 01/05/2020 01/05/2020  WBC 4.0 - 10.5 K/uL - - -  Hemoglobin 12.0 - 15.0 g/dL 9.9(L) 10.5(L) 10.9(L)  Hematocrit 36 -  46 % 29.0(L) 31.0(L) 32.0(L)  Platelets 150 - 400 K/uL - - -    . CMP Latest Ref Rng & Units 01/05/2020 01/05/2020 01/05/2020  Glucose 70 - 99 mg/dL 221(H) 189(H) 204(H)  BUN 8 - 23 mg/dL 8 7(L) 8  Creatinine 0.44 - 1.00 mg/dL 1.10(H) 0.90 0.80  Sodium 135 - 145 mmol/L 141 142 143  Potassium 3.5 - 5.1 mmol/L 5.0 4.4 3.7  Chloride 98 - 111 mmol/L 110 111 110  CO2 22 - 32 mmol/L - - -  Calcium 8.9 - 10.3 mg/dL - - -  Total Protein 6.5 - 8.1 g/dL - - -  Total Bilirubin 0.3 - 1.2 mg/dL - - -  Alkaline Phos 38 - 126 U/L - - -  AST 15 - 41 U/L - - -  ALT 0 - 44 U/L - - -   Component     Latest Ref Rng & Units 02/19/2019  IgG (Immunoglobin G), Serum     586 - 1,602 mg/dL 1,246  IgA     87 - 352 mg/dL 267  IgM (Immunoglobulin M), Srm     26 - 217 mg/dL 45  Total Protein ELP     6.0 - 8.5 g/dL 6.9  Albumin SerPl Elph-Mcnc     2.9 - 4.4 g/dL 3.6  Alpha 1     0.0 - 0.4 g/dL 0.2  Alpha2 Glob SerPl Elph-Mcnc     0.4 - 1.0 g/dL 0.8  B-Globulin SerPl Elph-Mcnc     0.7 -  1.3 g/dL 1.1  Gamma Glob SerPl Elph-Mcnc     0.4 - 1.8 g/dL 1.3  M Protein SerPl Elph-Mcnc     Not Observed g/dL Not Observed  Globulin, Total     2.2 - 3.9 g/dL 3.3  Albumin/Glob SerPl     0.7 - 1.7 1.1  IFE 1      Comment  Please Note (HCV):      Comment   03/25/18 SPEP and SFLC:     04/21/17 Cytogenetics:    01/17/17 Cytogenetics:    RADIOGRAPHIC STUDIES: I have personally reviewed the radiological images as listed and agreed with the findings in the report. No results found.  10/13/17 PET/CT     ASSESSMENT & PLAN:  64 y.o. female with  1. Multiple myeloma Currently in remission Autologous PBSCT on 07/01/17. IgG kappa multiple myeloma. Cytogenetics studies demonstrated normal karyotype, FISH studies are positive for 13q34 & D13S319 loss and no loss of p53. Based on initial information, patient was staged at R-ISS II.  Completed 6 cycles of induction with Revlimid, Velcade and  dexamethasone  12/09/2018 lumbar spine xray with results revealing "No acute osseous abnormality. Extensive multilevel degenerative changes are noted throughout the lumbar spine."  12/09/2018 sacrum and coccyx xray with results revealing "No acute osseous abnormality. Degenerative changes are noted of both hips."  2. Grade 2 neuropathy -controlled  02/26/18 NCV with EMG which revealed The electrophysiologic findings are most consistent with a chronic, length-dependent sensorimotor axonal and demyelinating polyneuropathy affecting the right side. Overall, these findings are moderate to severe in degree electrically. Incidentally, there is a right Martin-Gruber anastomosis, a normal variant.   3. Rt LE calf veins - possible clot ?chronic.  01/29/18 VAS [place] BLE which revealed Right: Ultrasound is unable to distinguish whether obstruction in the Peroneal veins, and great saphenous vein, and superficial veins/varciosities is acute or chronic. No cystic structure found in the popliteal fossa. Left: no evidence of DVT.    PLAN:  -No clinical or laboratory evidence of Multiple Myeloma progression at this tim -No prohibitive toxicities from Revlimid - continue 5 mg of maintenance Revlimid  -Advised additional risk factors from being on Revlimid can cause blood clots  -Will restart Zometa in 4 weeks, continue q12 weeks -Will see back in 3 months with labs   FOLLOW UP: Restart zometa - plz schedule next dose in 4 weeks and then every 12 weeks x 3 RTC with Dr Irene Limbo with labs in 3 months    The total time spent in the appt was 20 minutes and more than 50% was on counseling and direct patient cares.  All of the patient's questions were answered with apparent satisfaction. The patient knows to call the clinic with any problems, questions or concerns.   Sullivan Lone MD [place] [facility] Hematology/Oncology Physician [facility]  (Office):       [phone] (Work cell):   [phone] (Fax):           [phone]  03/16/2020 8:46 AM  I, Yevette Edwards, am acting as a scribe for Dr. Sullivan Lone.   .I have reviewed the above documentation for accuracy and completeness, and I agree with the above. Brunetta Genera MD

## 2020-03-20 LAB — MULTIPLE MYELOMA PANEL, SERUM
Albumin SerPl Elph-Mcnc: 3.3 g/dL (ref 2.9–4.4)
Albumin/Glob SerPl: 1 (ref 0.7–1.7)
Alpha 1: 0.2 g/dL (ref 0.0–0.4)
Alpha2 Glob SerPl Elph-Mcnc: 0.8 g/dL (ref 0.4–1.0)
B-Globulin SerPl Elph-Mcnc: 1.1 g/dL (ref 0.7–1.3)
Gamma Glob SerPl Elph-Mcnc: 1.1 g/dL (ref 0.4–1.8)
Globulin, Total: 3.4 g/dL (ref 2.2–3.9)
IgA: 289 mg/dL (ref 87–352)
IgG (Immunoglobin G), Serum: 1059 mg/dL (ref 586–1602)
IgM (Immunoglobulin M), Srm: 35 mg/dL (ref 26–217)
Total Protein ELP: 6.7 g/dL (ref 6.0–8.5)

## 2020-03-28 ENCOUNTER — Telehealth: Payer: Self-pay | Admitting: Hematology

## 2020-03-28 NOTE — Telephone Encounter (Signed)
Scheduled per 09/30 los, patient has been called and voicemail was left. 

## 2020-04-13 ENCOUNTER — Other Ambulatory Visit: Payer: Self-pay

## 2020-04-13 ENCOUNTER — Inpatient Hospital Stay: Payer: Medicare Other | Attending: Hematology

## 2020-04-13 VITALS — BP 134/65 | HR 73 | Temp 98.7°F | Resp 20

## 2020-04-13 DIAGNOSIS — C9001 Multiple myeloma in remission: Secondary | ICD-10-CM

## 2020-04-13 DIAGNOSIS — Z95828 Presence of other vascular implants and grafts: Secondary | ICD-10-CM

## 2020-04-13 MED ORDER — ZOLEDRONIC ACID 4 MG/100ML IV SOLN
4.0000 mg | Freq: Once | INTRAVENOUS | Status: AC
  Administered 2020-04-13: 4 mg via INTRAVENOUS

## 2020-04-13 MED ORDER — SODIUM CHLORIDE 0.9% FLUSH
10.0000 mL | Freq: Once | INTRAVENOUS | Status: AC
  Administered 2020-04-13: 10 mL
  Filled 2020-04-13: qty 10

## 2020-04-13 MED ORDER — HEPARIN SOD (PORK) LOCK FLUSH 100 UNIT/ML IV SOLN
500.0000 [IU] | Freq: Once | INTRAVENOUS | Status: AC
  Administered 2020-04-13: 500 [IU]
  Filled 2020-04-13: qty 5

## 2020-04-13 MED ORDER — ZOLEDRONIC ACID 4 MG/100ML IV SOLN
INTRAVENOUS | Status: AC
  Filled 2020-04-13: qty 100

## 2020-04-13 MED ORDER — SODIUM CHLORIDE 0.9 % IV SOLN
Freq: Once | INTRAVENOUS | Status: AC
  Filled 2020-04-13: qty 250

## 2020-04-13 NOTE — Patient Instructions (Signed)
Zoledronic Acid injection (Hypercalcemia, Oncology) What is this medicine? ZOLEDRONIC ACID (ZOE le dron ik AS id) lowers the amount of calcium loss from bone. It is used to treat too much calcium in your blood from cancer. It is also used to prevent complications of cancer that has spread to the bone. This medicine may be used for other purposes; ask your health care provider or pharmacist if you have questions. COMMON BRAND NAME(S): Zometa What should I tell my health care provider before I take this medicine? They need to know if you have any of these conditions:  aspirin-sensitive asthma  cancer, especially if you are receiving medicines used to treat cancer  dental disease or wear dentures  infection  kidney disease  receiving corticosteroids like dexamethasone or prednisone  an unusual or allergic reaction to zoledronic acid, other medicines, foods, dyes, or preservatives  pregnant or trying to get pregnant  breast-feeding How should I use this medicine? This medicine is for infusion into a vein. It is given by a health care professional in a hospital or clinic setting. Talk to your pediatrician regarding the use of this medicine in children. Special care may be needed. Overdosage: If you think you have taken too much of this medicine contact a poison control center or emergency room at once. NOTE: This medicine is only for you. Do not share this medicine with others. What if I miss a dose? It is important not to miss your dose. Call your doctor or health care professional if you are unable to keep an appointment. What may interact with this medicine?  certain antibiotics given by injection  NSAIDs, medicines for pain and inflammation, like ibuprofen or naproxen  some diuretics like bumetanide, furosemide  teriparatide  thalidomide This list may not describe all possible interactions. Give your health care provider a list of all the medicines, herbs, non-prescription  drugs, or dietary supplements you use. Also tell them if you smoke, drink alcohol, or use illegal drugs. Some items may interact with your medicine. What should I watch for while using this medicine? Visit your doctor or health care professional for regular checkups. It may be some time before you see the benefit from this medicine. Do not stop taking your medicine unless your doctor tells you to. Your doctor may order blood tests or other tests to see how you are doing. Women should inform their doctor if they wish to become pregnant or think they might be pregnant. There is a potential for serious side effects to an unborn child. Talk to your health care professional or pharmacist for more information. You should make sure that you get enough calcium and vitamin D while you are taking this medicine. Discuss the foods you eat and the vitamins you take with your health care professional. Some people who take this medicine have severe bone, joint, and/or muscle pain. This medicine may also increase your risk for jaw problems or a broken thigh bone. Tell your doctor right away if you have severe pain in your jaw, bones, joints, or muscles. Tell your doctor if you have any pain that does not go away or that gets worse. Tell your dentist and dental surgeon that you are taking this medicine. You should not have major dental surgery while on this medicine. See your dentist to have a dental exam and fix any dental problems before starting this medicine. Take good care of your teeth while on this medicine. Make sure you see your dentist for regular follow-up   appointments. What side effects may I notice from receiving this medicine? Side effects that you should report to your doctor or health care professional as soon as possible:  allergic reactions like skin rash, itching or hives, swelling of the face, lips, or tongue  anxiety, confusion, or depression  breathing problems  changes in vision  eye  pain  feeling faint or lightheaded, falls  jaw pain, especially after dental work  mouth sores  muscle cramps, stiffness, or weakness  redness, blistering, peeling or loosening of the skin, including inside the mouth  trouble passing urine or change in the amount of urine Side effects that usually do not require medical attention (report to your doctor or health care professional if they continue or are bothersome):  bone, joint, or muscle pain  constipation  diarrhea  fever  hair loss  irritation at site where injected  loss of appetite  nausea, vomiting  stomach upset  trouble sleeping  trouble swallowing  weak or tired This list may not describe all possible side effects. Call your doctor for medical advice about side effects. You may report side effects to FDA at 1-800-FDA-1088. Where should I keep my medicine? This drug is given in a hospital or clinic and will not be stored at home. NOTE: This sheet is a summary. It may not cover all possible information. If you have questions about this medicine, talk to your doctor, pharmacist, or health care provider.  2020 [employer] Standard (2013-10-30 14:19:39)  

## 2020-05-02 DIAGNOSIS — D492 Neoplasm of unspecified behavior of bone, soft tissue, and skin: Secondary | ICD-10-CM | POA: Diagnosis not present

## 2020-05-16 DIAGNOSIS — M4807 Spinal stenosis, lumbosacral region: Secondary | ICD-10-CM | POA: Diagnosis not present

## 2020-05-16 DIAGNOSIS — I10 Essential (primary) hypertension: Secondary | ICD-10-CM | POA: Diagnosis not present

## 2020-05-16 DIAGNOSIS — Z86711 Personal history of pulmonary embolism: Secondary | ICD-10-CM | POA: Diagnosis not present

## 2020-05-16 DIAGNOSIS — E1121 Type 2 diabetes mellitus with diabetic nephropathy: Secondary | ICD-10-CM | POA: Diagnosis not present

## 2020-05-16 DIAGNOSIS — I5042 Chronic combined systolic (congestive) and diastolic (congestive) heart failure: Secondary | ICD-10-CM | POA: Diagnosis not present

## 2020-06-26 ENCOUNTER — Encounter: Payer: Self-pay | Admitting: Neurology

## 2020-06-26 ENCOUNTER — Ambulatory Visit (INDEPENDENT_AMBULATORY_CARE_PROVIDER_SITE_OTHER): Payer: Medicare Other | Admitting: Neurology

## 2020-06-26 ENCOUNTER — Other Ambulatory Visit: Payer: Self-pay

## 2020-06-26 VITALS — BP 113/72 | HR 86 | Ht 69.0 in | Wt 221.0 lb

## 2020-06-26 DIAGNOSIS — G620 Drug-induced polyneuropathy: Secondary | ICD-10-CM | POA: Diagnosis not present

## 2020-06-26 DIAGNOSIS — G621 Alcoholic polyneuropathy: Secondary | ICD-10-CM | POA: Diagnosis not present

## 2020-06-26 NOTE — Patient Instructions (Signed)
Return to clinic in 1 year.

## 2020-06-26 NOTE — Progress Notes (Signed)
Follow-up Visit   Date: 06/26/20   Christine Garrett MRN: [id number] DOB: [birth year]   Interim History: Christine Garrett is a 65 y.o. right-handed African American female with multiple myeloma (2018), hypertension, GERD, diabetes mellitus, and alcohol use returning to the clinic for follow-up of neuropathy.  The patient was accompanied to the clinic by self.  History of present illness: In August 2018, she was diagnosed with IgG kappa multiple myeloma after presenting with weight loss, GI upset, and imaging showing left T8 lytic lesion with cord compression. She was treated with chemotherapy from August 2018 - November 2018 with bortezomib, lenalidomide, and dexamethasone. She underwent autologous stem cell transplantation in January 2019.  Lenalidomide was discontinued when right DVT was diagnosed.  Starting in late 2018, she began having imbalance, falls, and achy pain involving the legs which transitioned into more of a stabbing pain and numbness which involves her feet and worse on the right. She also has some tingling involving her lower legs and rarely in the fingertips. Prior to chemotherapy, she did have some numbness/tingling of the hands and feet.    UPDATE 06/26/2020:  She is here for follow-up of neuropathy.  She continues to have numbness in the feet and hands, nothing which has progressed.  Her pain is controlled on gabapentin 630m TID.  She underwent lumbar decompression in August 2021, no improvement in her leg paresthesias following surgery.  Her diabetes has been uncontrolled lately, last HbA1c (per patient) is 12, but she denies any new changes to lifestyle, diet, or medications.  She continues to drink 2 beers nightly, and expresses wishes to drink more, but her children do not allow her.  No falls.  She uses a cane for assistance.    Medications:  Current Outpatient Medications on File Prior to Visit  Medication Sig Dispense Refill  . amLODipine (NORVASC) 5 MG  tablet Take 5 mg by mouth daily.    .Marland Kitchenapixaban (ELIQUIS) 5 MG TABS tablet Take 1 tablet (5 mg total) by mouth 2 (two) times daily.    .Marland Kitchenatorvastatin (LIPITOR) 40 MG tablet Take 40 mg by mouth daily.    .Marland Kitchendicyclomine (BENTYL) 10 MG capsule Take 1 capsule (10 mg total) by mouth 3 (three) times daily as needed for spasms. 60 capsule 1  . enalapril (VASOTEC) 20 MG tablet Take 20 mg by mouth 2 (two) times daily.     .Marland Kitchengabapentin (NEURONTIN) 300 MG capsule Take 600 mg by mouth 3 (three) times daily.    . hydrALAZINE (APRESOLINE) 25 MG tablet Take 25 mg by mouth 2 (two) times daily.    . insulin degludec (TRESIBA FLEXTOUCH) 100 UNIT/ML SOPN FlexTouch Pen Inject 0.5 mLs (50 Units total) into the skin daily at 10 pm. (Patient taking differently: Inject 40 Units into the skin daily at 10 pm.) 5 pen 2  . Insulin Pen Needle (B-D ULTRAFINE III SHORT PEN) 31G X 8 MM MISC 1 each by Does not apply route as directed. 100 each 3  . metFORMIN (GLUCOPHAGE) 1000 MG tablet Take 1,000 mg by mouth 2 (two) times daily.     . metoprolol succinate (TOPROL-XL) 50 MG 24 hr tablet Take 50 mg by mouth daily. Take with or immediately following a meal.    . Omega-3 Fatty Acids (FISH OIL) 1000 MG CPDR Take 1,000 mg by mouth daily. Omega 3 300 mg    . potassium chloride (MICRO-K) 10 MEQ CR capsule Take 10 mEq by mouth daily.    .Marland Kitchen  REVLIMID 5 MG capsule TAKE 1 CAPSULE (5MG) BY  MOUTH DAILY FOR 21 DAYS ON, THEN 7 DAYS OFF 21 capsule 0  . Vitamin D, Ergocalciferol, (DRISDOL) 50000 units CAPS capsule Take 50,000 Units by mouth every Monday.      No current facility-administered medications on file prior to visit.    Allergies:  Allergies  Allergen Reactions  . Hydrocodone Other (See Comments)    MENTAL STATUS CHANGE  MENTAL STATUS CHANGE   (Pt states she is not allergic to Hydrocodone, she was recently taking it) MENTAL STATUS CHANGE      Vital Signs:  BP 113/72   Pulse 86   Ht _0  (1.753 m)   Wt 221 lb (100.2 kg)    SpO2 100%   BMI 32.64 kg/m     Neurological Exam: MENTAL STATUS including orientation to time, place, person, recent and remote memory, attention span and concentration, language, and fund of knowledge is normal.  Speech is not dysarthric.  CRANIAL NERVES:   Pupils equal round and reactive to light.  Normal conjugate, extra-ocular eye movements in all directions of gaze.  No ptosis.   MOTOR:  Motor strength is 5/5 in all extremities, including distally.  No atrophy, fasciculations or abnormal movements.  No pronator drift.  Tone is normal.    MSRs:  Reflexes are 2+/4 throughout, except absent at the ankles.  SENSORY:  Absent vibration below the ankles, reduced at the MCP, intact at the knees. Rhomberg positive.   COORDINATION/GAIT:  Gait is wide-based, slow, assisted with a cane.   Data:  NCS/EMG of the right side 02/26/2018: The electrophysiologic findings are most consistent with a chronic, length-dependent sensorimotor axonal and demyelinating polyneuropathy affecting the right side. Overall, these findings are moderate to severe in degree electrically.  Incidentally, there is a right Martin-Gruber anastomosis, a normal variant.   IMPRESSION/PLAN: Length-dependent peripheral neuropathy secondary to chemotherapy (bortezimb) and prior alcohol use, clinically stable.  Worsening diabetes (HbA1c 6.4 > 12) prediposes pt to progressive neuropathy, she will follow-up with PCP to address diabetes management.  I also counseled her on alcohol cessation, she is not interested, but has cut back to 2 beers/nightly.  Continue gabapentin 669m TID - refills sent Patient educated on daily foot inspection, fall prevention, and safety precautions around the home.  Return to clinic in 1 year.    Thank you for allowing me to participate in patient's care.  If I can answer any additional questions, I would be pleased to do so.    Sincerely,    Vickie Melnik K. PPosey Pronto DO

## 2020-07-05 NOTE — Progress Notes (Signed)
HEMATOLOGY/ONCOLOGY CLINIC NOTE  Date of Service: 07/05/2020  Patient Care Team: Neale Burly, MD as PCP - General (Internal Medicine) Alda Berthold, DO as Consulting Physician (Neurology)  CHIEF COMPLAINTS/PURPOSE OF CONSULTATION:  F/u for continued mx of Multiple myeloma  Oncologic History:  66 y.o. female who initially presented with significant weight loss in the context of H. pylori-associated gastritis and alcohol abuse. At the same time, an incidental discovery of a lower thoracic vertebral mass in the context of chronic numbness and weakness in the lower extremities. MRI of the thoracic spine demonstrated a destructive lesion at T8 level and patient underwent surgical treatment. 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 Staging evaluation conducted by our clinic confirmed presence of IgG kappa multiple myeloma. Cytogenetics studies demonstrated normal karyotype, FISH studies are positive for 13q34 & D13S319 loss and no loss of p53. Based on initial information, patient was staged at R-[facility] II.   Patient has completed 6 cycles of induction systemic chemotherapy with lenalidomide, bortezomib, and low-dose dexamethasone.  Repeat bone marrow biopsy demonstrated excellent response to treatment and patient subsequently underwent consolidation therapy including melphalan conditioning and autologous stem cell rescue.  Repeat assessment with PET/CT and bone marrow biopsy on 10/13/2017 demonstrates stringent complete response maintenance.  HISTORY OF PRESENTING ILLNESS:   Christine Garrett is a wonderful 65 y.o. female who has been referred to [place] by my colleague Dr Grace Isaac for evaluation and management of Multiple myeloma. The pt reports that she is doing well overall.   The pt has previously been followed by my colleague Dr Grace Isaac and completed 6 cycles of induction with Revlimid, Velcade and dexamethasone. She had a autologous PBSCT on  07/01/17. She is on lenalidomide maintenance currently.   The pt reports that she has had a headache for the past week, noting a constant pounding in the front of her forehead. She denies fevers and is always cold. She is having clear, nasal discharge and seasonal allergies. She notes aspirin has alleviated her headache some.   She is continuing to take Revlimid on 21 day cycles.  Most recent lab results (11/21/17) of CBC and CMP is as follows: all values are WNL.  MMP 11/21/17 is shows no M spike   On review of systems, pt reports headache, moving her bowels well, increasing strength, and denies mouth sores, fevers, night sweats, and any other symptoms.   INTERVAL HISTORY:   Christine Garrett returns today for management and evaluation of her Multiple Myeloma currently in remission. The patient's last visit with [place] was on 03/16/2020. The pt reports that she is doing well overall.  The pt reports that she has been doing well, but continues to have very liquid stools. This has worsened since the last visit, but she claims that she is not currently on any medications for this. The pt has continued to f/u with her gastroenterologist, and got a colonoscopy. This revealed eight polypss that were removed after. A biopsy was performed and there was some change in the lining of the bowels. The doctor is currently awaiting results.   The pt noted that she is currently off the Revlimid due to back surgery, as her back doctor (Dr. Christella Noa) instructed her to stay off blood thinners and the Revlimid post-surgery. She claims he wanted to do x-rays again, thus stopped the Revlimid two weeks ago following this appointment.  This back pain has remained unchanged following.The pt notes that her diarrhea has worsened in the two  weeks off Revlimid. She also claims some fatigue.   The pt claims that the neuropathy in her hands and feet has worsened since last visit. She was instructed to avoid alcohol and maintain her  diabetes in hopes to improve this.  The pt also noted a new lump over her right posterior shoulder. She claims this was first noticed in the shower a few days ago. The pt notes that she had a tumor removed over 13 years ago while living in [place] that was very similar to this new lump.   The pt has received her COVID vaccines, but not the booster. She agrees to get that today. She also noted that she received her annual flu vaccine recently at a local [facility].   Lab results today 07/06/2020 of CBC w/diff and CMP is as follows: all values are WNL except for RBC at 3.81, RDW at 15.8. CMP in progress. 07/06/2020 MMP - no M spike  On review of systems, pt reports diarrhea, fatigue, lump over right posterior shoulder and denies bone pain, pain along back, abdominal pain,  and any other symptoms.  MEDICAL HISTORY:  Past Medical History:  Diagnosis Date  . Arthritis    knees  . Cancer ([place])   . Carpal tunnel syndrome   . CHF (congestive heart failure) ([place])   . Diabetes mellitus    type 2  . GERD (gastroesophageal reflux disease)   . Heart murmur    "Little, No concerns" per Dr  Dannielle Burn pt reported.  . Hypertension    controlled using a guided approch with plasma renin activity  . Multiple myeloma not having achieved remission ([place]) 01/06/2017  . Neuropathy   . Peripheral vascular disease ([place])   . Sleep apnea    01/03/2020: per patient, hasn't used CPAP machine in two years due to inability to breathe well with it on    SURGICAL HISTORY: Past Surgical History:  Procedure Laterality Date  . BIOPSY  11/28/2016   Procedure: BIOPSY;  Surgeon: Rogene Houston, MD;  [facility];  Service: Endoscopy;;  gastric  . BIOPSY  07/02/2019   Procedure: BIOPSY;  Surgeon: Rogene Houston, MD;  [facility];  Service: Endoscopy;;  . [place]  . CERVICAL CONE BIOPSY     cervical lesion  . COLONOSCOPY WITH PROPOFOL N/A 07/02/2019   Procedure: COLONOSCOPY WITH PROPOFOL;   Surgeon: Rogene Houston, MD;  [facility];  Service: Endoscopy;  Laterality: N/A;  12:40 - pt can't come earlier due to transportation  . ESOPHAGOGASTRODUODENOSCOPY (EGD) WITH PROPOFOL N/A 11/28/2016   Procedure: ESOPHAGOGASTRODUODENOSCOPY (EGD) WITH PROPOFOL;  Surgeon: Rogene Houston, MD;  [facility];  Service: Endoscopy;  Laterality: N/A;  9:25  . IR FLUORO GUIDE PORT INSERTION RIGHT  01/29/2017  . IR US GUIDE VASC ACCESS RIGHT  01/29/2017  . lipoma removal     right shoulder 2001  . LUMBAR FUSION  01/05/2020    Lumbar Two-Three Lumbar Three-Four Lumbar Four-Five Posterior lumbar interbody fusion with decompression (N/A Spine Lumbar)  . MULTIPLE EXTRACTIONS WITH ALVEOLOPLASTY Bilateral 08/24/2012   Procedure: MULTIPLE EXTRACTION WITH ALVEOLOPLASTY BIOPSY OF RIGHT AND LEFT MANDIBLE ;  Surgeon: Gae Bon, DDS;  Location: [facility];  Service: Oral Surgery;  Laterality: Bilateral;  . POLYPECTOMY  07/02/2019   Procedure: POLYPECTOMY;  Surgeon: Rogene Houston, MD;  [facility];  Service: Endoscopy;;  . POSTERIOR LUMBAR FUSION 4 LEVEL N/A 12/26/2016  Procedure: THORACIC EIGHT TUMOR RESECTION, THORACIC SIX- THORACIC TEN POSTERIOR SPINAL FUSION;  Surgeon: Ashok Pall, MD;  Location: [facility];  Service: Neurosurgery;  Laterality: N/A;  THORACIC 8 TUMOR RESECTION, THORACIC 6- THORACIC 10 POSTERIOR SPINAL FUSION  . ROTATOR CUFF REPAIR     right shoulder  . TUBAL LIGATION      SOCIAL HISTORY: Social History   Socioeconomic History  . Marital status: Single    Spouse name: Not on file  . Number of children: 3  . Years of education: Not on file  . Highest education level: Not on file  Occupational History  . Occupation: on disability  . Occupation: former Quarry manager  Tobacco Use  . Smoking status: Former Smoker    Packs/day: 0.50    Years: 6.00    Pack years: 3.00    Types: Cigarettes    Quit date: 2019    Years since quitting: 3.0  . Smokeless tobacco: Never  Used  Vaping Use  . Vaping Use: Never used  Substance and Sexual Activity  . Alcohol use: No    Alcohol/week: 6.0 standard drinks    Types: 6 Cans of beer per week  . Drug use: No  . Sexual activity: Not Currently    Birth control/protection: Post-menopausal  Other Topics Concern  . Not on file  Social History Narrative   Lives with son, daughter and grandkids in a 2 story home but stays on the first floor.  Has 3 children.  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      Right handed   Social Determinants of Health   Financial Resource Strain: Not on file  Food Insecurity: Not on file  Transportation Needs: Not on file  Physical Activity: Not on file  Stress: Not on file  Social Connections: Not on file  Intimate Partner Violence: Not on file    FAMILY HISTORY: Family History  Problem Relation Age of Onset  . Diabetes Mother   . Hypertension Mother   . Heart disease Mother   . Alzheimer's disease Mother   . Diabetes Father   . Heart disease Sister   . Diabetes Sister     ALLERGIES:  is allergic to hydrocodone.  MEDICATIONS:  Current Outpatient Medications  Medication Sig Dispense Refill  . amLODipine (NORVASC) 5 MG tablet Take 5 mg by mouth daily.    Marland Kitchen apixaban (ELIQUIS) 5 MG TABS tablet Take 1 tablet (5 mg total) by mouth 2 (two) times daily.    Marland Kitchen atorvastatin (LIPITOR) 40 MG tablet Take 40 mg by mouth daily.    Marland Kitchen dicyclomine (BENTYL) 10 MG capsule Take 1 capsule (10 mg total) by mouth 3 (three) times daily as needed for spasms. 60 capsule 1  . enalapril (VASOTEC) 20 MG tablet Take 20 mg by mouth 2 (two) times daily.     Marland Kitchen gabapentin (NEURONTIN) 300 MG capsule Take 600 mg by mouth 3 (three) times daily.    . hydrALAZINE (APRESOLINE) 25 MG tablet Take 25 mg by mouth 2 (two) times daily.    . insulin degludec (TRESIBA FLEXTOUCH) 100 UNIT/ML SOPN FlexTouch Pen Inject 0.5 mLs (50 Units total) into the skin daily at 10 pm. (Patient taking  differently: Inject 40 Units into the skin daily at 10 pm.) 5 pen 2  . Insulin Pen Needle (B-D ULTRAFINE III SHORT PEN) 31G X 8 MM MISC 1 each by Does not apply route as directed. 100 each 3  .  metFORMIN (GLUCOPHAGE) 1000 MG tablet Take 1,000 mg by mouth 2 (two) times daily.     . metoprolol succinate (TOPROL-XL) 50 MG 24 hr tablet Take 50 mg by mouth daily. Take with or immediately following a meal.    . Omega-3 Fatty Acids (FISH OIL) 1000 MG CPDR Take 1,000 mg by mouth daily. Omega 3 300 mg    . potassium chloride (MICRO-K) 10 MEQ CR capsule Take 10 mEq by mouth daily.    Marland Kitchen REVLIMID 5 MG capsule TAKE 1 CAPSULE (5MG) BY  MOUTH DAILY FOR 21 DAYS ON, THEN 7 DAYS OFF 21 capsule 0  . Vitamin D, Ergocalciferol, (DRISDOL) 50000 units CAPS capsule Take 50,000 Units by mouth every Monday.      No current facility-administered medications for this visit.    REVIEW OF SYSTEMS:   10 Point review of Systems was done is negative except as noted above.    PHYSICAL EXAMINATION: ECOG PERFORMANCE STATUS: 1 - Symptomatic but completely ambulatory   Vitals:   07/06/20 0926  BP: (!) 164/77  Pulse: 88  Resp: 18  Temp: 98.2 F (36.8 C)  SpO2: 100%   Filed Weights   07/06/20 0926  Weight: 215 lb 11.2 oz (97.8 kg)   Body mass index is 31.85 kg/m.    GENERAL:alert, in no acute distress and comfortable; mass-like lesion in right, upper posterior shoulder SKIN: no acute rashes, no significant lesions EYES: conjunctiva are pink and non-injected, sclera anicteric OROPHARYNX: MMM, no exudates, no oropharyngeal erythema or ulceration NECK: supple, no JVD LYMPH:  no palpable lymphadenopathy in the cervical, axillary or inguinal regions LUNGS: clear to auscultation b/l with normal respiratory effort HEART: regular rate & rhythm ABDOMEN:  normoactive bowel sounds , non tender, not distended. Extremity: no pedal edema PSYCH: alert & oriented x 3 with fluent speech NEURO: no focal motor/sensory  deficit    LABORATORY DATA:  I have reviewed the data as listed  . CBC Latest Ref Rng & Units 07/06/2020 03/16/2020 01/05/2020  WBC 4.0 - 10.5 K/uL 5.5 6.6 -  Hemoglobin 12.0 - 15.0 g/dL 12.0 11.1(L) 9.9(L)  Hematocrit 36.0 - 46.0 % 37.2 34.0(L) 29.0(L)  Platelets 150 - 400 K/uL 233 240 -    . CMP Latest Ref Rng & Units 07/06/2020 03/16/2020 01/05/2020  Glucose 70 - 99 mg/dL 102(H) 476(H) 221(H)  BUN 8 - 23 mg/dL _0 Creatinine 0.44 - 1.00 mg/dL 1.03(H) 1.19(H) 1.10(H)  Sodium 135 - 145 mmol/L 143 134(L) 141  Potassium 3.5 - 5.1 mmol/L 4.2 4.0 5.0  Chloride 98 - 111 mmol/L 112(H) 101 110  CO2 22 - 32 mmol/L 21(L) 26 -  Calcium 8.9 - 10.3 mg/dL 10.1 10.0 -  Total Protein 6.5 - 8.1 g/dL 7.9 7.3 -  Total Bilirubin 0.3 - 1.2 mg/dL 0.4 0.4 -  Alkaline Phos 38 - 126 U/L 89 113 -  AST 15 - 41 U/L 25 15 -  ALT 0 - 44 U/L 24 15 -   Component     Latest Ref Rng & Units 02/19/2019  IgG (Immunoglobin G), Serum     586 - 1,602 mg/dL 1,246  IgA     87 - 352 mg/dL 267  IgM (Immunoglobulin M), Srm     26 - 217 mg/dL 45  Total Protein ELP     6.0 - 8.5 g/dL 6.9  Albumin SerPl Elph-Mcnc     2.9 - 4.4 g/dL 3.6  Alpha 1     0.0 -  0.4 g/dL 0.2  Alpha2 Glob SerPl Elph-Mcnc     0.4 - 1.0 g/dL 0.8  B-Globulin SerPl Elph-Mcnc     0.7 - 1.3 g/dL 1.1  Gamma Glob SerPl Elph-Mcnc     0.4 - 1.8 g/dL 1.3  M Protein SerPl Elph-Mcnc     Not Observed g/dL Not Observed  Globulin, Total     2.2 - 3.9 g/dL 3.3  Albumin/Glob SerPl     0.7 - 1.7 1.1  IFE 1      Comment  Please Note (HCV):      Comment   03/25/18 SPEP and SFLC:     04/21/17 Cytogenetics:    01/17/17 Cytogenetics:    RADIOGRAPHIC STUDIES: I have personally reviewed the radiological images as listed and agreed with the findings in the report. No results found.  10/13/17 PET/CT     ASSESSMENT & PLAN:   65 y.o. female with  1. Multiple myeloma Currently in remission Autologous PBSCT on 07/01/17. IgG kappa multiple  myeloma. Cytogenetics studies demonstrated normal karyotype, FISH studies are positive for 13q34 & D13S319 loss and no loss of p53. Based on initial information, patient was staged at R-ISS II.  Completed 6 cycles of induction with Revlimid, Velcade and dexamethasone  12/09/2018 lumbar spine xray with results revealing "No acute osseous abnormality. Extensive multilevel degenerative changes are noted throughout the lumbar spine."  12/09/2018 sacrum and coccyx xray with results revealing "No acute osseous abnormality. Degenerative changes are noted of both hips."  2. Grade 2 neuropathy -controlled  02/26/18 NCV with EMG which revealed The electrophysiologic findings are most consistent with a chronic, length-dependent sensorimotor axonal and demyelinating polyneuropathy affecting the right side. Overall, these findings are moderate to severe in degree electrically. Incidentally, there is a right Martin-Gruber anastomosis, a normal variant.   3. Rt LE calf veins - possible clot ?chronic.  01/29/18 VAS [place] BLE which revealed Right: Ultrasound is unable to distinguish whether obstruction in the Peroneal veins, and great saphenous vein, and superficial veins/varciosities is acute or chronic. No cystic structure found in the popliteal fossa. Left: no evidence of DVT.    PLAN: -Discussed pt labwork today, 07/06/2020; blood counts stable on Revlimid therapy, blood chemistries- stable and myeloma panel with no evidence of myeloma progression at this time. -No clinical or laboratory evidence of Multiple Myeloma progression at this time. -Discussed Metformin as possible cause for her loose stools. She will f/u with PCP regarding lowering dosage. -Discussed remission two years post transplant-- continuing maintenance dose Revlimid or stopping. The pt wishes to discontinue Revlimid. She will reconsider again in three months. -Advise pt the current goals are to monitor diabetes and high blood pressure. -CT  Chest in 1 week to evaluate rt posterior upper chest nodule -Will see back in 2 weeks over phone. -Will continue Zometa q12 weeks. -Will get COVID booster today. -Will see back in 3 months with labs.    FOLLOW UP: Covid booster vaccine today after zometa infusion CT chest in 1 week Phone visit with Dr Irene Limbo in 2 weeks RTC with Dr Irene Limbo with labs and next zometa infusion in 3 months      The total time spent in the appointment was 30 minutes and more than 50% was on counseling and direct patient cares.  All of the patient's questions were answered with apparent satisfaction. The patient knows to call the clinic with any problems, questions or concerns.   Sullivan Lone MD [place] AAHIVMS [facility] Hematology/Oncology Physician [facility]  (Office):       [phone] (Work cell):  [phone] (Fax):           [phone]  07/05/2020 2:54 PM  I, Reinaldo Raddle, am acting as scribe for Dr. Sullivan Lone, MD.   .I have reviewed the above documentation for accuracy and completeness, and I agree with the above. Brunetta Genera MD

## 2020-07-06 ENCOUNTER — Inpatient Hospital Stay: Payer: Medicare Other | Attending: Hematology

## 2020-07-06 ENCOUNTER — Other Ambulatory Visit: Payer: Self-pay

## 2020-07-06 ENCOUNTER — Inpatient Hospital Stay (HOSPITAL_BASED_OUTPATIENT_CLINIC_OR_DEPARTMENT_OTHER): Payer: Medicare Other | Admitting: Hematology

## 2020-07-06 ENCOUNTER — Inpatient Hospital Stay: Payer: Medicare Other

## 2020-07-06 VITALS — BP 164/77 | HR 88 | Temp 98.2°F | Resp 18 | Ht 69.0 in | Wt 215.7 lb

## 2020-07-06 DIAGNOSIS — Z23 Encounter for immunization: Secondary | ICD-10-CM | POA: Insufficient documentation

## 2020-07-06 DIAGNOSIS — R222 Localized swelling, mass and lump, trunk: Secondary | ICD-10-CM | POA: Diagnosis not present

## 2020-07-06 DIAGNOSIS — C9001 Multiple myeloma in remission: Secondary | ICD-10-CM

## 2020-07-06 DIAGNOSIS — Z95828 Presence of other vascular implants and grafts: Secondary | ICD-10-CM

## 2020-07-06 LAB — CMP (CANCER CENTER ONLY)
ALT: 24 U/L (ref 0–44)
AST: 25 U/L (ref 15–41)
Albumin: 3.8 g/dL (ref 3.5–5.0)
Alkaline Phosphatase: 89 U/L (ref 38–126)
Anion gap: 10 (ref 5–15)
BUN: 13 mg/dL (ref 8–23)
CO2: 21 mmol/L — ABNORMAL LOW (ref 22–32)
Calcium: 10.1 mg/dL (ref 8.9–10.3)
Chloride: 112 mmol/L — ABNORMAL HIGH (ref 98–111)
Creatinine: 1.03 mg/dL — ABNORMAL HIGH (ref 0.44–1.00)
GFR, Estimated: >60 mL/min (ref >60)
Glucose, Bld: 102 mg/dL — ABNORMAL HIGH (ref 70–99)
Potassium: 4.2 mmol/L (ref 3.5–5.1)
Sodium: 143 mmol/L (ref 135–145)
Total Bilirubin: 0.4 mg/dL (ref 0.3–1.2)
Total Protein: 7.9 g/dL (ref 6.5–8.1)

## 2020-07-06 LAB — CBC WITH DIFFERENTIAL (CANCER CENTER ONLY)
Abs Immature Granulocytes: 0.02 10*3/uL (ref 0.00–0.07)
Basophils Absolute: 0 10*3/uL (ref 0.0–0.1)
Basophils Relative: 0 %
Eosinophils Absolute: 0.2 10*3/uL (ref 0.0–0.5)
Eosinophils Relative: 4 %
HCT: 37.2 % (ref 36.0–46.0)
Hemoglobin: 12 g/dL (ref 12.0–15.0)
Immature Granulocytes: 0 %
Lymphocytes Relative: 40 %
Lymphs Abs: 2.2 10*3/uL (ref 0.7–4.0)
MCH: 31.5 pg (ref 26.0–34.0)
MCHC: 32.3 g/dL (ref 30.0–36.0)
MCV: 97.6 fL (ref 80.0–100.0)
Monocytes Absolute: 0.4 10*3/uL (ref 0.1–1.0)
Monocytes Relative: 8 %
Neutro Abs: 2.7 10*3/uL (ref 1.7–7.7)
Neutrophils Relative %: 48 %
Platelet Count: 233 10*3/uL (ref 150–400)
RBC: 3.81 MIL/uL — ABNORMAL LOW (ref 3.87–5.11)
RDW: 15.8 % — ABNORMAL HIGH (ref 11.5–15.5)
WBC Count: 5.5 10*3/uL (ref 4.0–10.5)
nRBC: 0 % (ref 0.0–0.2)

## 2020-07-06 MED ORDER — ZOLEDRONIC ACID 4 MG/100ML IV SOLN
4.0000 mg | Freq: Once | INTRAVENOUS | Status: AC
Start: 2020-07-06 — End: 2020-07-06
  Administered 2020-07-06: 4 mg via INTRAVENOUS

## 2020-07-06 MED ORDER — SODIUM CHLORIDE 0.9 % IV SOLN
INTRAVENOUS | Status: DC
  Filled 2020-07-06: qty 250

## 2020-07-06 MED ORDER — ZOLEDRONIC ACID 4 MG/100ML IV SOLN
INTRAVENOUS | Status: AC
  Filled 2020-07-06: qty 100

## 2020-07-06 MED ORDER — SODIUM CHLORIDE 0.9% FLUSH
10.0000 mL | Freq: Once | INTRAVENOUS | Status: AC
  Administered 2020-07-06: 10 mL
  Filled 2020-07-06: qty 10

## 2020-07-06 MED ORDER — HEPARIN SOD (PORK) LOCK FLUSH 100 UNIT/ML IV SOLN
500.0000 [IU] | Freq: Once | INTRAVENOUS | Status: AC
  Administered 2020-07-06: 500 [IU]
  Filled 2020-07-06: qty 5

## 2020-07-06 NOTE — Patient Instructions (Signed)
Zoledronic Acid Injection (Hypercalcemia, Oncology) What is this medicine? ZOLEDRONIC ACID (ZOE le dron ik AS id) slows calcium loss from bones. It high calcium levels in the blood from some kinds of cancer. It may be used in other people at risk for bone loss. This medicine may be used for other purposes; ask your health care provider or pharmacist if you have questions. COMMON BRAND NAME(S): Zometa What should I tell my health care provider before I take this medicine? They need to know if you have any of these conditions:  cancer  dehydration  dental disease  kidney disease  liver disease  low levels of calcium in the blood  lung or breathing disease (asthma)  receiving steroids like dexamethasone or prednisone  an unusual or allergic reaction to zoledronic acid, other medicines, foods, dyes, or preservatives  pregnant or trying to get pregnant  breast-feeding How should I use this medicine? This drug is injected into a vein. It is given by a health care provider in a hospital or clinic setting. Talk to your health care provider about the use of this drug in children. Special care may be needed. Overdosage: If you think you have taken too much of this medicine contact a poison control center or emergency room at once. NOTE: This medicine is only for you. Do not share this medicine with others. What if I miss a dose? Keep appointments for follow-up doses. It is important not to miss your dose. Call your health care provider if you are unable to keep an appointment. What may interact with this medicine?  certain antibiotics given by injection  NSAIDs, medicines for pain and inflammation, like ibuprofen or naproxen  some diuretics like bumetanide, furosemide  teriparatide  thalidomide This list may not describe all possible interactions. Give your health care provider a list of all the medicines, herbs, non-prescription drugs, or dietary supplements you use. Also tell  them if you smoke, drink alcohol, or use illegal drugs. Some items may interact with your medicine. What should I watch for while using this medicine? Visit your health care provider for regular checks on your progress. It may be some time before you see the benefit from this drug. Some people who take this drug have severe bone, joint, or muscle pain. This drug may also increase your risk for jaw problems or a broken thigh bone. Tell your health care provider right away if you have severe pain in your jaw, bones, joints, or muscles. Tell you health care provider if you have any pain that does not go away or that gets worse. Tell your dentist and dental surgeon that you are taking this drug. You should not have major dental surgery while on this drug. See your dentist to have a dental exam and fix any dental problems before starting this drug. Take good care of your teeth while on this drug. Make sure you see your dentist for regular follow-up appointments. You should make sure you get enough calcium and vitamin D while you are taking this drug. Discuss the foods you eat and the vitamins you take with your health care provider. Check with your health care provider if you have severe diarrhea, nausea, and vomiting, or if you sweat a lot. The loss of too much body fluid may make it dangerous for you to take this drug. You may need blood work done while you are taking this drug. Do not become pregnant while taking this drug. Women should inform their health care provider   if they wish to become pregnant or think they might be pregnant. There is potential for serious harm to an unborn child. Talk to your health care provider for more information. What side effects may I notice from receiving this medicine? Side effects that you should report to your doctor or health care provider as soon as possible:  allergic reactions (skin rash, itching or hives; swelling of the face, lips, or tongue)  bone  pain  infection (fever, chills, cough, sore throat, pain or trouble passing urine)  jaw pain, especially after dental work  joint pain  kidney injury (trouble passing urine or change in the amount of urine)  low blood pressure (dizziness; feeling faint or lightheaded, falls; unusually weak or tired)  low calcium levels (fast heartbeat; muscle cramps or pain; pain, tingling, or numbness in the hands or feet; seizures)  low magnesium levels (fast, irregular heartbeat; muscle cramp or pain; muscle weakness; tremors; seizures)  low red blood cell counts (trouble breathing; feeling faint; lightheaded, falls; unusually weak or tired)  muscle pain  redness, blistering, peeling, or loosening of the skin, including inside the mouth  severe diarrhea  swelling of the ankles, feet, hands  trouble breathing Side effects that usually do not require medical attention (report to your doctor or health care provider if they continue or are bothersome):  anxious  constipation  coughing  depressed mood  eye irritation, itching, or pain  fever  general ill feeling or flu-like symptoms  nausea  pain, redness, or irritation at site where injected  trouble sleeping This list may not describe all possible side effects. Call your doctor for medical advice about side effects. You may report side effects to FDA at 1-800-FDA-1088. Where should I keep my medicine? This drug is given in a hospital or clinic. It will not be stored at home. NOTE: This sheet is a summary. It may not cover all possible information. If you have questions about this medicine, talk to your doctor, pharmacist, or health care provider.  2021 Elsevier/Gold Standard (2019-03-18 09:13:00)  

## 2020-07-10 LAB — MULTIPLE MYELOMA PANEL, SERUM
Albumin SerPl Elph-Mcnc: 4 g/dL (ref 2.9–4.4)
Albumin/Glob SerPl: 1.3 (ref 0.7–1.7)
Alpha 1: 0.2 g/dL (ref 0.0–0.4)
Alpha2 Glob SerPl Elph-Mcnc: 0.8 g/dL (ref 0.4–1.0)
B-Globulin SerPl Elph-Mcnc: 1.1 g/dL (ref 0.7–1.3)
Gamma Glob SerPl Elph-Mcnc: 1.3 g/dL (ref 0.4–1.8)
Globulin, Total: 3.3 g/dL (ref 2.2–3.9)
IgA: 254 mg/dL (ref 87–352)
IgG (Immunoglobin G), Serum: 1292 mg/dL (ref 586–1602)
IgM (Immunoglobulin M), Srm: 29 mg/dL (ref 26–217)
Total Protein ELP: 7.3 g/dL (ref 6.0–8.5)

## 2020-07-20 ENCOUNTER — Ambulatory Visit: Payer: Medicare Other | Admitting: Hematology

## 2020-08-30 ENCOUNTER — Other Ambulatory Visit: Payer: Self-pay | Admitting: Neurology

## 2020-09-04 DIAGNOSIS — M4807 Spinal stenosis, lumbosacral region: Secondary | ICD-10-CM | POA: Diagnosis not present

## 2020-09-04 DIAGNOSIS — Z0000 Encounter for general adult medical examination without abnormal findings: Secondary | ICD-10-CM | POA: Diagnosis not present

## 2020-09-04 DIAGNOSIS — E1121 Type 2 diabetes mellitus with diabetic nephropathy: Secondary | ICD-10-CM | POA: Diagnosis not present

## 2020-09-04 DIAGNOSIS — Z86711 Personal history of pulmonary embolism: Secondary | ICD-10-CM | POA: Diagnosis not present

## 2020-09-04 DIAGNOSIS — I5042 Chronic combined systolic (congestive) and diastolic (congestive) heart failure: Secondary | ICD-10-CM | POA: Diagnosis not present

## 2020-09-04 DIAGNOSIS — Z1331 Encounter for screening for depression: Secondary | ICD-10-CM | POA: Diagnosis not present

## 2020-09-04 DIAGNOSIS — I10 Essential (primary) hypertension: Secondary | ICD-10-CM | POA: Diagnosis not present

## 2020-09-22 ENCOUNTER — Telehealth: Payer: Self-pay | Admitting: Hematology

## 2020-09-22 NOTE — Telephone Encounter (Signed)
Left message with rescheduled upcoming appointment due to provider's PAL. Gave option to call back to reschedule if needed. 

## 2020-09-28 ENCOUNTER — Ambulatory Visit: Payer: Medicare Other

## 2020-10-05 ENCOUNTER — Ambulatory Visit: Payer: Medicare Other | Admitting: Hematology

## 2020-10-05 ENCOUNTER — Ambulatory Visit: Payer: Medicare Other

## 2020-10-05 ENCOUNTER — Other Ambulatory Visit: Payer: Medicare Other

## 2020-10-18 NOTE — Progress Notes (Signed)
HEMATOLOGY/ONCOLOGY CLINIC NOTE  Date of Service: 10/19/2020  Patient Care Team: Neale Burly, MD as PCP - General (Internal Medicine) Alda Berthold, DO as Consulting Physician (Neurology)  CHIEF COMPLAINTS/PURPOSE OF CONSULTATION:  F/u for continued mx of Multiple myeloma  Oncologic History:  65 y.o. female who initially presented with significant weight loss in the context of H. pylori-associated gastritis and alcohol abuse. At the same time, an incidental discovery of a lower thoracic vertebral mass in the context of chronic numbness and weakness in the lower extremities. MRI of the thoracic spine demonstrated a destructive lesion at T8 level and patient underwent surgical treatment. 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 Staging evaluation conducted by our clinic confirmed presence of IgG kappa multiple myeloma. Cytogenetics studies demonstrated normal karyotype, FISH studies are positive for 13q34 & D13S319 loss and no loss of p53. Based on initial information, patient was staged at [facility].   Patient has completed 6 cycles of induction systemic chemotherapy with lenalidomide, bortezomib, and low-dose dexamethasone.  Repeat bone marrow biopsy demonstrated excellent response to treatment and patient subsequently underwent consolidation therapy including melphalan conditioning and autologous stem cell rescue.  Repeat assessment with PET/CT and bone marrow biopsy on 10/13/2017 demonstrates stringent complete response maintenance.  HISTORY OF PRESENTING ILLNESS:   Christine Garrett is a wonderful 65 y.o. female who has been referred to [place] by my colleague Dr Grace Isaac for evaluation and management of Multiple myeloma. The pt reports that she is doing well overall.   The pt has previously been followed by my colleague Dr Grace Isaac and completed 6 cycles of induction with Revlimid, Velcade and dexamethasone. She had a autologous PBSCT on  07/01/17. She is on lenalidomide maintenance currently.   The pt reports that she has had a headache for the past week, noting a constant pounding in the front of her forehead. She denies fevers and is always cold. She is having clear, nasal discharge and seasonal allergies. She notes aspirin has alleviated her headache some.   She is continuing to take Revlimid on 21 day cycles.  Most recent lab results (11/21/17) of CBC and CMP is as follows: all values are WNL.  MMP 11/21/17 is shows no M spike   On review of systems, pt reports headache, moving her bowels well, increasing strength, and denies mouth sores, fevers, night sweats, and any other symptoms.   INTERVAL HISTORY:   Christine Garrett returns today for management and evaluation of her Multiple Myeloma currently in remission. The patient's last visit with [place] was on 07/06/2020. The pt reports that she is doing well overall.  The pt reports no new concerns or symptoms since the last visit. The pt notes she still has no energy. The pt notes that she gets very tired when walking, such as when she was at the [employer] recently. The pt notes her appetite has not been well, but reports no weight loss. The pt notes that she has not been as active as she should be. The pt notes her neuropathy is worsening, but her blood sugars have been controlled with this new insulin changes.  Lab results today 10/19/2020 of CBC w/diff and CMP is as follows: all values are WNL except for RBC of 3.78, Creatinine of 1.03. 10/19/2020 MMP I-- no M spike.  On review of systems, pt reports fatigue, worsening neuropathy and denies fevers, chills, night sweats, abdominal pain, leg swelling, and any other symptoms.  MEDICAL HISTORY:  Past Medical History:  Diagnosis Date  . Arthritis    knees  . Cancer ([place])   . Carpal tunnel syndrome   . CHF (congestive heart failure) ([place])   . Diabetes mellitus    type 2  . GERD (gastroesophageal reflux disease)   .  Heart murmur    "Little, No concerns" per Dr  Dannielle Burn pt reported.  . Hypertension    controlled using a guided approch with plasma renin activity  . Multiple myeloma not having achieved remission ([place]) 01/06/2017  . Neuropathy   . Peripheral vascular disease ([place])   . Sleep apnea    01/03/2020: per patient, hasn't used CPAP machine in two years due to inability to breathe well with it on    SURGICAL HISTORY: Past Surgical History:  Procedure Laterality Date  . BIOPSY  11/28/2016   Procedure: BIOPSY;  Surgeon: Rogene Houston, MD;  [facility];  Service: Endoscopy;;  gastric  . BIOPSY  07/02/2019   Procedure: BIOPSY;  Surgeon: Rogene Houston, MD;  [facility];  Service: Endoscopy;;  . [place]  . CERVICAL CONE BIOPSY     cervical lesion  . COLONOSCOPY WITH PROPOFOL N/A 07/02/2019   Procedure: COLONOSCOPY WITH PROPOFOL;  Surgeon: Rogene Houston, MD;  [facility];  Service: Endoscopy;  Laterality: N/A;  12:40 - pt can't come earlier due to transportation  . ESOPHAGOGASTRODUODENOSCOPY (EGD) WITH PROPOFOL N/A 11/28/2016   Procedure: ESOPHAGOGASTRODUODENOSCOPY (EGD) WITH PROPOFOL;  Surgeon: Rogene Houston, MD;  [facility];  Service: Endoscopy;  Laterality: N/A;  9:25  . IR FLUORO GUIDE PORT INSERTION RIGHT  01/29/2017  . IR US GUIDE VASC ACCESS RIGHT  01/29/2017  . lipoma removal     right shoulder 2001  . LUMBAR FUSION  01/05/2020    Lumbar Two-Three Lumbar Three-Four Lumbar Four-Five Posterior lumbar interbody fusion with decompression (N/A Spine Lumbar)  . MULTIPLE EXTRACTIONS WITH ALVEOLOPLASTY Bilateral 08/24/2012   Procedure: MULTIPLE EXTRACTION WITH ALVEOLOPLASTY BIOPSY OF RIGHT AND LEFT MANDIBLE ;  Surgeon: Gae Bon, DDS;  Location: [facility];  Service: Oral Surgery;  Laterality: Bilateral;  . POLYPECTOMY  07/02/2019   Procedure: POLYPECTOMY;  Surgeon: Rogene Houston, MD;  [facility];  Service: Endoscopy;;  .  POSTERIOR LUMBAR FUSION 4 LEVEL N/A 12/26/2016   Procedure: THORACIC EIGHT TUMOR RESECTION, THORACIC SIX- THORACIC TEN POSTERIOR SPINAL FUSION;  Surgeon: Ashok Pall, MD;  Location: [facility];  Service: Neurosurgery;  Laterality: N/A;  THORACIC 8 TUMOR RESECTION, THORACIC 6- THORACIC 10 POSTERIOR SPINAL FUSION  . ROTATOR CUFF REPAIR     right shoulder  . TUBAL LIGATION      SOCIAL HISTORY: Social History   Socioeconomic History  . Marital status: Single    Spouse name: Not on file  . Number of children: 3  . Years of education: Not on file  . Highest education level: Not on file  Occupational History  . Occupation: on disability  . Occupation: former Quarry manager  Tobacco Use  . Smoking status: Former Smoker    Packs/day: 0.50    Years: 6.00    Pack years: 3.00    Types: Cigarettes    Quit date: 2019    Years since quitting: 3.3  . Smokeless tobacco: Never Used  Vaping Use  . Vaping Use: Never used  Substance and Sexual Activity  . Alcohol use: No    Alcohol/week: 6.0 standard drinks  Types: 6 Cans of beer per week  . Drug use: No  . Sexual activity: Not Currently    Birth control/protection: Post-menopausal  Other Topics Concern  . Not on file  Social History Narrative   Lives with son, daughter and grandkids in a 2 story home but stays on the first floor.  Has 3 children.  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      Right handed   Social Determinants of Health   Financial Resource Strain: Not on file  Food Insecurity: Not on file  Transportation Needs: Not on file  Physical Activity: Not on file  Stress: Not on file  Social Connections: Not on file  Intimate Partner Violence: Not on file    FAMILY HISTORY: Family History  Problem Relation Age of Onset  . Diabetes Mother   . Hypertension Mother   . Heart disease Mother   . Alzheimer's disease Mother   . Diabetes Father   . Heart disease Sister   . Diabetes Sister     ALLERGIES:  is  allergic to hydrocodone.  MEDICATIONS:  Current Outpatient Medications  Medication Sig Dispense Refill  . amLODipine (NORVASC) 5 MG tablet Take 5 mg by mouth daily.    Marland Kitchen apixaban (ELIQUIS) 5 MG TABS tablet Take 1 tablet (5 mg total) by mouth 2 (two) times daily.    Marland Kitchen atorvastatin (LIPITOR) 40 MG tablet Take 40 mg by mouth daily.    Marland Kitchen dicyclomine (BENTYL) 10 MG capsule Take 1 capsule (10 mg total) by mouth 3 (three) times daily as needed for spasms. 60 capsule 1  . enalapril (VASOTEC) 20 MG tablet Take 20 mg by mouth 2 (two) times daily.     Marland Kitchen gabapentin (NEURONTIN) 300 MG capsule TAKE 2 CAPSULES (600 MG TOTAL) BY MOUTH 3 (THREE) TIMES DAILY. 540 capsule 0  . hydrALAZINE (APRESOLINE) 25 MG tablet Take 25 mg by mouth 2 (two) times daily.    . insulin degludec (TRESIBA FLEXTOUCH) 100 UNIT/ML SOPN FlexTouch Pen Inject 0.5 mLs (50 Units total) into the skin daily at 10 pm. (Patient taking differently: Inject 40 Units into the skin daily at 10 pm.) 5 pen 2  . Insulin Pen Needle (B-D ULTRAFINE III SHORT PEN) 31G X 8 MM MISC 1 each by Does not apply route as directed. 100 each 3  . metFORMIN (GLUCOPHAGE) 1000 MG tablet Take 1,000 mg by mouth 2 (two) times daily.     . metoprolol succinate (TOPROL-XL) 50 MG 24 hr tablet Take 50 mg by mouth daily. Take with or immediately following a meal.    . Omega-3 Fatty Acids (FISH OIL) 1000 MG CPDR Take 1,000 mg by mouth daily. Omega 3 300 mg    . potassium chloride (MICRO-K) 10 MEQ CR capsule Take 10 mEq by mouth daily.    Marland Kitchen REVLIMID 5 MG capsule TAKE 1 CAPSULE (5MG) BY  MOUTH DAILY FOR 21 DAYS ON, THEN 7 DAYS OFF 21 capsule 0  . Vitamin D, Ergocalciferol, (DRISDOL) 50000 units CAPS capsule Take 50,000 Units by mouth every Monday.      No current facility-administered medications for this visit.    REVIEW OF SYSTEMS:   10 Point review of Systems was done is negative except as noted above.    PHYSICAL EXAMINATION: ECOG PERFORMANCE STATUS: 1 - Symptomatic but  completely ambulatory   Vitals:   10/19/20 1006  BP: (!) 143/66  Pulse: 76  Resp: 18  Temp:  98 F (36.7 C)  SpO2: 100%   Filed Weights   10/19/20 1006  Weight: 223 lb 4.8 oz (101.3 kg)   Body mass index is 32.98 kg/m.    Exam was given in a chair.   GENERAL:alert, in no acute distress and comfortable; mass-like lesion in right, upper posterior shoulder SKIN: no acute rashes, no significant lesions EYES: conjunctiva are pink and non-injected, sclera anicteric OROPHARYNX: MMM, no exudates, no oropharyngeal erythema or ulceration NECK: supple, no JVD LYMPH:  no palpable lymphadenopathy in the cervical, axillary or inguinal regions LUNGS: clear to auscultation b/l with normal respiratory effort HEART: regular rate & rhythm ABDOMEN:  normoactive bowel sounds , non tender, not distended. Extremity: no pedal edema PSYCH: alert & oriented x 3 with fluent speech NEURO: no focal motor/sensory deficit    LABORATORY DATA:  I have reviewed the data as listed  . CBC Latest Ref Rng & Units 10/19/2020 07/06/2020 03/16/2020  WBC 4.0 - 10.5 K/uL 5.9 5.5 6.6  Hemoglobin 12.0 - 15.0 g/dL 12.1 12.0 11.1(L)  Hematocrit 36.0 - 46.0 % 37.0 37.2 34.0(L)  Platelets 150 - 400 K/uL 240 233 240    . CMP Latest Ref Rng & Units 10/19/2020 07/06/2020 03/16/2020  Glucose 70 - 99 mg/dL 81 102(H) 476(H)  BUN 8 - 23 mg/dL '9 13 8  ' Creatinine 0.44 - 1.00 mg/dL 1.03(H) 1.03(H) 1.19(H)  Sodium 135 - 145 mmol/L 145 143 134(L)  Potassium 3.5 - 5.1 mmol/L 4.0 4.2 4.0  Chloride 98 - 111 mmol/L 110 112(H) 101  CO2 22 - 32 mmol/L 25 21(L) 26  Calcium 8.9 - 10.3 mg/dL 9.9 10.1 10.0  Total Protein 6.5 - 8.1 g/dL 7.7 7.9 7.3  Total Bilirubin 0.3 - 1.2 mg/dL 0.5 0.4 0.4  Alkaline Phos 38 - 126 U/L 72 89 113  AST 15 - 41 U/L '24 25 15  ' ALT 0 - 44 U/L '19 24 15   ' Component     Latest Ref Rng & Units 02/19/2019  IgG (Immunoglobin G), Serum     586 - 1,602 mg/dL 1,246  IgA     87 - 352 mg/dL 267  IgM  (Immunoglobulin M), Srm     26 - 217 mg/dL 45  Total Protein ELP     6.0 - 8.5 g/dL 6.9  Albumin SerPl Elph-Mcnc     2.9 - 4.4 g/dL 3.6  Alpha 1     0.0 - 0.4 g/dL 0.2  Alpha2 Glob SerPl Elph-Mcnc     0.4 - 1.0 g/dL 0.8  B-Globulin SerPl Elph-Mcnc     0.7 - 1.3 g/dL 1.1  Gamma Glob SerPl Elph-Mcnc     0.4 - 1.8 g/dL 1.3  M Protein SerPl Elph-Mcnc     Not Observed g/dL Not Observed  Globulin, Total     2.2 - 3.9 g/dL 3.3  Albumin/Glob SerPl     0.7 - 1.7 1.1  IFE 1      Comment  Please Note (HCV):      Comment   03/25/18 SPEP and SFLC:     04/21/17 Cytogenetics:    01/17/17 Cytogenetics:    RADIOGRAPHIC STUDIES: I have personally reviewed the radiological images as listed and agreed with the findings in the report. No results found.  10/13/17 PET/CT     ASSESSMENT & PLAN:   65 y.o. female with  1. Multiple myeloma Currently in remission Autologous PBSCT on 07/01/17. IgG kappa multiple myeloma. Cytogenetics studies demonstrated normal karyotype, FISH studies are  positive for 13q34 & D13S319 loss and no loss of p53. Based on initial information, patient was staged at [facility].  Completed 6 cycles of induction with Revlimid, Velcade and dexamethasone  12/09/2018 lumbar spine xray with results revealing "No acute osseous abnormality. Extensive multilevel degenerative changes are noted throughout the lumbar spine."  12/09/2018 sacrum and coccyx xray with results revealing "No acute osseous abnormality. Degenerative changes are noted of both hips."  2. Grade 2 neuropathy -controlled  02/26/18 NCV with EMG which revealed The electrophysiologic findings are most consistent with a chronic, length-dependent sensorimotor axonal and demyelinating polyneuropathy affecting the right side. Overall, these findings are moderate to severe in degree electrically. Incidentally, there is a right Martin-Gruber anastomosis, a normal variant.   3. Rt LE calf veins - possible clot  ?chronic.  01/29/18 VAS [place] BLE which revealed Right: Ultrasound is unable to distinguish whether obstruction in the Peroneal veins, and great saphenous vein, and superficial veins/varciosities is acute or chronic. No cystic structure found in the popliteal fossa. Left: no evidence of DVT.    PLAN: -Discussed pt labwork today, 10/19/2020; blood counts and chemistries stable. MMP sent out. -Recommended pt receive the second COVID booster shot as recently approved. The pt notes that she does not desire this at this time. -Discussed [place] and pt's eligibility. The pt does not desire this. -Recommended pt go to Cancer PT to help build her stamina and strength. The pt is agreeable to this.  -Advised pt that better controlling the diabetes will most likely help with her neuropathy. -Recommended pt have the repeat CT as scheduled. The pt is agreeable and is unsure why it was not scheduled. -Continue to f/u w PCP for diabetes management. -Continue to f/u w Neurology for neuropathy. -No clinical or laboratory evidence of Multiple Myeloma progression at this time. -Will continue Zometa q12 weeks. -Recommended pt continue to stay up to date with all age related cancer screenings. -Will get PET CT in 1-2 weeks. -Will refer to Cancer PT. -Start Vitamin B-Complex daily. Continue Multivitamin daily if pt is taking this. -Will see back in 3 months with labs.   FOLLOW UP: CT chest in 1 week RTC with Dr Irene Limbo with portflush, labs and next dose of Zometa in 3 months Referral to cancer rehab     The total time spent in the appointment was 30 minutes and more than 50% was on counseling and direct patient cares.  All of the patient's questions were answered with apparent satisfaction. The patient knows to call the clinic with any problems, questions or concerns.   Sullivan Lone MD [place] [facility]AHIVMS [facility] Hematology/Oncology Physician [facility]  (Office):       [phone] (Work cell):   [phone] (Fax):           [phone]  10/19/2020 10:56 AM  I, Reinaldo Raddle, am acting as scribe for Dr. Sullivan Lone, MD.   .I have reviewed the above documentation for accuracy and completeness, and I agree with the above. Brunetta Genera MD

## 2020-10-19 ENCOUNTER — Other Ambulatory Visit: Payer: Self-pay

## 2020-10-19 ENCOUNTER — Inpatient Hospital Stay: Payer: Medicare HMO | Attending: Hematology

## 2020-10-19 ENCOUNTER — Inpatient Hospital Stay (HOSPITAL_BASED_OUTPATIENT_CLINIC_OR_DEPARTMENT_OTHER): Payer: Medicare HMO | Admitting: Hematology

## 2020-10-19 ENCOUNTER — Inpatient Hospital Stay: Payer: Medicare HMO

## 2020-10-19 VITALS — BP 143/66 | HR 76 | Temp 98.0°F | Resp 18 | Ht 69.0 in | Wt 223.3 lb

## 2020-10-19 DIAGNOSIS — C9001 Multiple myeloma in remission: Secondary | ICD-10-CM

## 2020-10-19 DIAGNOSIS — Z95828 Presence of other vascular implants and grafts: Secondary | ICD-10-CM

## 2020-10-19 DIAGNOSIS — R918 Other nonspecific abnormal finding of lung field: Secondary | ICD-10-CM

## 2020-10-19 LAB — CBC WITH DIFFERENTIAL/PLATELET
Abs Immature Granulocytes: 0.01 10*3/uL (ref 0.00–0.07)
Basophils Absolute: 0 10*3/uL (ref 0.0–0.1)
Basophils Relative: 1 %
Eosinophils Absolute: 0.1 10*3/uL (ref 0.0–0.5)
Eosinophils Relative: 2 %
HCT: 37 % (ref 36.0–46.0)
Hemoglobin: 12.1 g/dL (ref 12.0–15.0)
Immature Granulocytes: 0 %
Lymphocytes Relative: 37 %
Lymphs Abs: 2.2 10*3/uL (ref 0.7–4.0)
MCH: 32 pg (ref 26.0–34.0)
MCHC: 32.7 g/dL (ref 30.0–36.0)
MCV: 97.9 fL (ref 80.0–100.0)
Monocytes Absolute: 0.4 10*3/uL (ref 0.1–1.0)
Monocytes Relative: 8 %
Neutro Abs: 3.1 10*3/uL (ref 1.7–7.7)
Neutrophils Relative %: 52 %
Platelets: 240 10*3/uL (ref 150–400)
RBC: 3.78 MIL/uL — ABNORMAL LOW (ref 3.87–5.11)
RDW: 14.1 % (ref 11.5–15.5)
WBC: 5.9 10*3/uL (ref 4.0–10.5)
nRBC: 0 % (ref 0.0–0.2)

## 2020-10-19 LAB — CMP (CANCER CENTER ONLY)
ALT: 19 U/L (ref 0–44)
AST: 24 U/L (ref 15–41)
Albumin: 3.9 g/dL (ref 3.5–5.0)
Alkaline Phosphatase: 72 U/L (ref 38–126)
Anion gap: 10 (ref 5–15)
BUN: 9 mg/dL (ref 8–23)
CO2: 25 mmol/L (ref 22–32)
Calcium: 9.9 mg/dL (ref 8.9–10.3)
Chloride: 110 mmol/L (ref 98–111)
Creatinine: 1.03 mg/dL — ABNORMAL HIGH (ref 0.44–1.00)
GFR, Estimated: >60 mL/min (ref >60)
Glucose, Bld: 81 mg/dL (ref 70–99)
Potassium: 4 mmol/L (ref 3.5–5.1)
Sodium: 145 mmol/L (ref 135–145)
Total Bilirubin: 0.5 mg/dL (ref 0.3–1.2)
Total Protein: 7.7 g/dL (ref 6.5–8.1)

## 2020-10-19 MED ORDER — SODIUM CHLORIDE 0.9 % IV SOLN
INTRAVENOUS | Status: DC
  Filled 2020-10-19: qty 250

## 2020-10-19 MED ORDER — HEPARIN SOD (PORK) LOCK FLUSH 100 UNIT/ML IV SOLN
500.0000 [IU] | Freq: Once | INTRAVENOUS | Status: AC
  Administered 2020-10-19: 500 [IU]
  Filled 2020-10-19: qty 5

## 2020-10-19 MED ORDER — SODIUM CHLORIDE 0.9% FLUSH
10.0000 mL | Freq: Once | INTRAVENOUS | Status: AC
  Administered 2020-10-19: 10 mL
  Filled 2020-10-19: qty 10

## 2020-10-19 MED ORDER — ZOLEDRONIC ACID 4 MG/100ML IV SOLN
4.0000 mg | Freq: Once | INTRAVENOUS | Status: AC
  Administered 2020-10-19: 4 mg via INTRAVENOUS

## 2020-10-19 MED ORDER — ZOLEDRONIC ACID 4 MG/100ML IV SOLN
INTRAVENOUS | Status: AC
  Filled 2020-10-19: qty 100

## 2020-10-19 NOTE — Patient Instructions (Signed)
Zoledronic Acid Injection (Hypercalcemia, Oncology) What is this medicine? ZOLEDRONIC ACID (ZOE le dron ik AS id) slows calcium loss from bones. It high calcium levels in the blood from some kinds of cancer. It may be used in other people at risk for bone loss. This medicine may be used for other purposes; ask your health care provider or pharmacist if you have questions. COMMON BRAND NAME(S): Zometa What should I tell my health care provider before I take this medicine? They need to know if you have any of these conditions:  cancer  dehydration  dental disease  kidney disease  liver disease  low levels of calcium in the blood  lung or breathing disease (asthma)  receiving steroids like dexamethasone or prednisone  an unusual or allergic reaction to zoledronic acid, other medicines, foods, dyes, or preservatives  pregnant or trying to get pregnant  breast-feeding How should I use this medicine? This drug is injected into a vein. It is given by a health care provider in a hospital or clinic setting. Talk to your health care provider about the use of this drug in children. Special care may be needed. Overdosage: If you think you have taken too much of this medicine contact a poison control center or emergency room at once. NOTE: This medicine is only for you. Do not share this medicine with others. What if I miss a dose? Keep appointments for follow-up doses. It is important not to miss your dose. Call your health care provider if you are unable to keep an appointment. What may interact with this medicine?  certain antibiotics given by injection  NSAIDs, medicines for pain and inflammation, like ibuprofen or naproxen  some diuretics like bumetanide, furosemide  teriparatide  thalidomide This list may not describe all possible interactions. Give your health care provider a list of all the medicines, herbs, non-prescription drugs, or dietary supplements you use. Also tell  them if you smoke, drink alcohol, or use illegal drugs. Some items may interact with your medicine. What should I watch for while using this medicine? Visit your health care provider for regular checks on your progress. It may be some time before you see the benefit from this drug. Some people who take this drug have severe bone, joint, or muscle pain. This drug may also increase your risk for jaw problems or a broken thigh bone. Tell your health care provider right away if you have severe pain in your jaw, bones, joints, or muscles. Tell you health care provider if you have any pain that does not go away or that gets worse. Tell your dentist and dental surgeon that you are taking this drug. You should not have major dental surgery while on this drug. See your dentist to have a dental exam and fix any dental problems before starting this drug. Take good care of your teeth while on this drug. Make sure you see your dentist for regular follow-up appointments. You should make sure you get enough calcium and vitamin D while you are taking this drug. Discuss the foods you eat and the vitamins you take with your health care provider. Check with your health care provider if you have severe diarrhea, nausea, and vomiting, or if you sweat a lot. The loss of too much body fluid may make it dangerous for you to take this drug. You may need blood work done while you are taking this drug. Do not become pregnant while taking this drug. Women should inform their health care provider   if they wish to become pregnant or think they might be pregnant. There is potential for serious harm to an unborn child. Talk to your health care provider for more information. What side effects may I notice from receiving this medicine? Side effects that you should report to your doctor or health care provider as soon as possible:  allergic reactions (skin rash, itching or hives; swelling of the face, lips, or tongue)  bone  pain  infection (fever, chills, cough, sore throat, pain or trouble passing urine)  jaw pain, especially after dental work  joint pain  kidney injury (trouble passing urine or change in the amount of urine)  low blood pressure (dizziness; feeling faint or lightheaded, falls; unusually weak or tired)  low calcium levels (fast heartbeat; muscle cramps or pain; pain, tingling, or numbness in the hands or feet; seizures)  low magnesium levels (fast, irregular heartbeat; muscle cramp or pain; muscle weakness; tremors; seizures)  low red blood cell counts (trouble breathing; feeling faint; lightheaded, falls; unusually weak or tired)  muscle pain  redness, blistering, peeling, or loosening of the skin, including inside the mouth  severe diarrhea  swelling of the ankles, feet, hands  trouble breathing Side effects that usually do not require medical attention (report to your doctor or health care provider if they continue or are bothersome):  anxious  constipation  coughing  depressed mood  eye irritation, itching, or pain  fever  general ill feeling or flu-like symptoms  nausea  pain, redness, or irritation at site where injected  trouble sleeping This list may not describe all possible side effects. Call your doctor for medical advice about side effects. You may report side effects to FDA at 1-800-FDA-1088. Where should I keep my medicine? This drug is given in a hospital or clinic. It will not be stored at home. NOTE: This sheet is a summary. It may not cover all possible information. If you have questions about this medicine, talk to your doctor, pharmacist, or health care provider.  2021 Elsevier/Gold Standard (2019-03-18 09:13:00)  

## 2020-10-23 LAB — MULTIPLE MYELOMA PANEL, SERUM
Albumin SerPl Elph-Mcnc: 3.9 g/dL (ref 2.9–4.4)
Albumin/Glob SerPl: 1.3 (ref 0.7–1.7)
Alpha 1: 0.3 g/dL (ref 0.0–0.4)
Alpha2 Glob SerPl Elph-Mcnc: 0.8 g/dL (ref 0.4–1.0)
B-Globulin SerPl Elph-Mcnc: 1.1 g/dL (ref 0.7–1.3)
Gamma Glob SerPl Elph-Mcnc: 1.2 g/dL (ref 0.4–1.8)
Globulin, Total: 3.2 g/dL (ref 2.2–3.9)
IgA: 255 mg/dL (ref 87–352)
IgG (Immunoglobin G), Serum: 1241 mg/dL (ref 586–1602)
IgM (Immunoglobulin M), Srm: 34 mg/dL (ref 26–217)
Total Protein ELP: 7.1 g/dL (ref 6.0–8.5)

## 2020-10-25 ENCOUNTER — Ambulatory Visit: Payer: Medicare HMO | Admitting: Physical Therapy

## 2020-10-31 DIAGNOSIS — D492 Neoplasm of unspecified behavior of bone, soft tissue, and skin: Secondary | ICD-10-CM | POA: Diagnosis not present

## 2020-10-31 DIAGNOSIS — M4316 Spondylolisthesis, lumbar region: Secondary | ICD-10-CM | POA: Diagnosis not present

## 2020-10-31 DIAGNOSIS — I10 Essential (primary) hypertension: Secondary | ICD-10-CM | POA: Diagnosis not present

## 2020-10-31 DIAGNOSIS — Z6834 Body mass index (BMI) 34.0-34.9, adult: Secondary | ICD-10-CM | POA: Diagnosis not present

## 2020-11-02 ENCOUNTER — Ambulatory Visit (HOSPITAL_COMMUNITY)
Admission: RE | Admit: 2020-11-02 | Discharge: 2020-11-02 | Disposition: A | Discharge status: Home / Self Care | Payer: Medicare HMO | Source: Ambulatory Visit | Attending: Hematology | Admitting: Hematology

## 2020-11-02 ENCOUNTER — Other Ambulatory Visit: Payer: Self-pay

## 2020-11-02 DIAGNOSIS — J984 Other disorders of lung: Secondary | ICD-10-CM | POA: Diagnosis not present

## 2020-11-02 DIAGNOSIS — I700 Atherosclerosis of aorta: Secondary | ICD-10-CM | POA: Diagnosis not present

## 2020-11-02 DIAGNOSIS — I712 Thoracic aortic aneurysm, without rupture: Secondary | ICD-10-CM | POA: Diagnosis not present

## 2020-11-02 DIAGNOSIS — R918 Other nonspecific abnormal finding of lung field: Secondary | ICD-10-CM | POA: Diagnosis not present

## 2020-11-02 DIAGNOSIS — I2510 Atherosclerotic heart disease of native coronary artery without angina pectoris: Secondary | ICD-10-CM | POA: Diagnosis not present

## 2020-11-07 ENCOUNTER — Ambulatory Visit: Payer: Medicare HMO | Attending: Hematology | Admitting: Rehabilitation

## 2020-11-07 ENCOUNTER — Other Ambulatory Visit: Payer: Self-pay

## 2020-11-07 ENCOUNTER — Encounter: Payer: Self-pay | Admitting: Rehabilitation

## 2020-11-07 DIAGNOSIS — R2689 Other abnormalities of gait and mobility: Secondary | ICD-10-CM | POA: Insufficient documentation

## 2020-11-07 DIAGNOSIS — Z7409 Other reduced mobility: Secondary | ICD-10-CM | POA: Insufficient documentation

## 2020-11-07 DIAGNOSIS — C9001 Multiple myeloma in remission: Secondary | ICD-10-CM | POA: Insufficient documentation

## 2020-11-07 NOTE — Therapy (Signed)
[place], [place], [address] Phone: [phone]   Fax:  [phone]  Physical Therapy Evaluation  Patient Details  Name: Christine Garrett MRN: [id number] Date of Birth: 1956-06-17 Referring Provider (PT): Dr. Irene Limbo   Encounter Date: 11/07/2020   PT End of Session - 11/07/20 1714    Visit Number 1    Number of Visits 13    Date for PT Re-Evaluation 12/26/20    Authorization Type MCR/MCD    Progress Note Due on Visit 10    PT Start Time 3557    PT Stop Time 1455    PT Time Calculation (min) 50 min    Activity Tolerance Patient tolerated treatment well    Behavior During Therapy [facility] for tasks assessed/performed           Past Medical History:  Diagnosis Date  . Arthritis    knees  . Cancer ([place])   . Carpal tunnel syndrome   . CHF (congestive heart failure) ([place])   . Diabetes mellitus    type 2  . GERD (gastroesophageal reflux disease)   . Heart murmur    "Little, No concerns" per Dr  Dannielle Burn pt reported.  . Hypertension    controlled using a guided approch with plasma renin activity  . Multiple myeloma not having achieved remission ([place]) 01/06/2017  . Neuropathy   . Peripheral vascular disease ([place])   . Sleep apnea    01/03/2020: per patient, hasn't used CPAP machine in two years due to inability to breathe well with it on    Past Surgical History:  Procedure Laterality Date  . BIOPSY  11/28/2016   Procedure: BIOPSY;  Surgeon: Rogene Houston, MD;  [facility];  Service: Endoscopy;;  gastric  . BIOPSY  07/02/2019   Procedure: BIOPSY;  Surgeon: Rogene Houston, MD;  [facility];  Service: Endoscopy;;  . [place]  . CERVICAL CONE BIOPSY     cervical lesion  . COLONOSCOPY WITH PROPOFOL N/A 07/02/2019   Procedure: COLONOSCOPY WITH PROPOFOL;  Surgeon: Rogene Houston, MD;  [facility];  Service: Endoscopy;  Laterality: N/A;  12:40 - pt can't come  earlier due to transportation  . ESOPHAGOGASTRODUODENOSCOPY (EGD) WITH PROPOFOL N/A 11/28/2016   Procedure: ESOPHAGOGASTRODUODENOSCOPY (EGD) WITH PROPOFOL;  Surgeon: Rogene Houston, MD;  [facility];  Service: Endoscopy;  Laterality: N/A;  9:25  . IR FLUORO GUIDE PORT INSERTION RIGHT  01/29/2017  . IR US GUIDE VASC ACCESS RIGHT  01/29/2017  . lipoma removal     right shoulder 2001  . LUMBAR FUSION  01/05/2020    Lumbar Two-Three Lumbar Three-Four Lumbar Four-Five Posterior lumbar interbody fusion with decompression (N/A Spine Lumbar)  . MULTIPLE EXTRACTIONS WITH ALVEOLOPLASTY Bilateral 08/24/2012   Procedure: MULTIPLE EXTRACTION WITH ALVEOLOPLASTY BIOPSY OF RIGHT AND LEFT MANDIBLE ;  Surgeon: Gae Bon, DDS;  Location: [place];  Service: Oral Surgery;  Laterality: Bilateral;  . POLYPECTOMY  07/02/2019   Procedure: POLYPECTOMY;  Surgeon: Rogene Houston, MD;  [facility];  Service: Endoscopy;;  . POSTERIOR LUMBAR FUSION 4 LEVEL N/A 12/26/2016   Procedure: THORACIC EIGHT TUMOR RESECTION, THORACIC SIX- THORACIC TEN POSTERIOR SPINAL FUSION;  Surgeon: Ashok Pall, MD;  Location: [place];  Service: Neurosurgery;  Laterality: N/A;  THORACIC 8 TUMOR RESECTION, THORACIC 6- THORACIC 10 POSTERIOR SPINAL FUSION  . ROTATOR CUFF REPAIR     right shoulder  .  TUBAL LIGATION      There were no vitals filed for this visit.    Subjective Assessment - 11/07/20 1406    Subjective I am having problems standing and walking, I am tripping over my feet or drifting to the side or fall down.    Pertinent History Multiple myeloma in remission - completed chemotherapy SCT on 07/01/17 currently on lenalidomide / revlimid, other history includes: CHF, lumbar fusion L2-5 x2, CIPN    Limitations Standing;Walking    How long can you walk comfortably? I get rides, I use the buggy    Patient Stated Goals I want to not fall    Currently in Pain? Yes   the lower back   Pain Score 6     Pain Location  Back    Pain Orientation Lower    Pain Descriptors / Indicators Aching;Dull    Pain Type Chronic pain    Pain Onset More than a month ago    Pain Frequency Constant    Aggravating Factors  it just always hurts    Multiple Pain Sites Yes    Pain Score 10    Pain Location Foot    Pain Orientation Right;Left    Pain Descriptors / Indicators Aching;Burning    Pain Type Neuropathic pain    Pain Onset More than a month ago    Pain Frequency Constant    Aggravating Factors  wearing shoes,    Pain Relieving Factors no shoes - lowest of 5/10              [facility] PT Assessment - 11/07/20 0001      Assessment   Medical Diagnosis multple myeloma    Referring Provider (PT) Dr. Irene Limbo    Onset Date/Surgical Date 07/01/17    Hand Dominance Right    Prior Therapy no      Precautions   Precautions Fall      Restrictions   Weight Bearing Restrictions No      Balance Screen   Has the patient fallen in the past 6 months Yes    How many times? 10   mostly trying to get to the bathroom, I stubble walking around   Has the patient had a decrease in activity level because of a fear of falling?  Yes    Is the patient reluctant to leave their home because of a fear of falling?  Yes      [place];Other relatives    Available Help at Discharge Family    Type of [place] Access Level entry    [place] to live on main level with bedroom/bathroom    Home Equipment Bedside commode;Cane - single point;Shower seat    Additional Comments ind with self care      Prior Function   Level of Independence Independent with household mobility with device    Vocation On disability    Leisure I just walk to the [employer]   Overall Cognitive Status Within Functional Limits for tasks assessed      Sensation   Additional Comments no prick felt top or bottom of foot bil with darco foot       Ambulation/Gait   Gait Comments with SPC small steps      6 Minute Walk- Baseline   6 Minute Walk- Baseline yes      6 Minute  walk- Post Test   6 Minute Walk Post Test yes    Modified Borg Scale for Dyspnea 2- Mild shortness of breath    Perceived Rate of Exertion (Borg) 13- Somewhat hard      6 minute walk test results    Aerobic Endurance Distance Walked 160    Endurance additional comments using SPC and gait belt in lobby area stopping due to fear of falling      Balance   Balance Assessed Yes      Standardized Balance Assessment   Standardized Balance Assessment Five Times Sit to Stand;Berg Balance Test;Timed Up and Go Test    Five times sit to stand comments  40.2 seconds demonstrating recurrent falls      Timed Up and Go Test   Normal TUG (seconds) 25.11   showing high fall risk   TUG Comments gait speed 0.37ms needing intervention for fall risk                      Objective measurements completed on examination: See above findings.       [facility] Treatment/Exercise - 11/07/20 0001      Self-Care   Self-Care Other Self-Care Comments    Other Self-Care Comments  discussed falling in regards to [facility]fers, using rollator walker vs cane for more confidence, balance education                  PT Education - 11/07/20 1500    Education Details POC, Assistive devices, balance and proprioception    Person(s) Educated Patient    Methods Explanation    Comprehension Verbalized understanding               PT Long Term Goals - 11/07/20 1722      PT LONG TERM GOAL #1   Title Pt will improve TUG to less than 20 seconds    Baseline 25.11    Time 6    Period Weeks    Status New      PT LONG TERM GOAL #2   Title Pt will improve 5 times sit to stand to at least less than 30 seconds    Baseline 40.2    Time 6    Period Weeks    Status New      PT LONG TERM GOAL #3   Title Pt will perform full 665m walk test without needing to  stop    Baseline 1:40    Time 6    Period Weeks    Status New      PT LONG TERM GOAL #4   Title Pt will be ind with final HEP    Time 6    Period Weeks    Status New                  Plan - 11/07/20 1715    Clinical Impression Statement Pt presents to physical therapy 3 years post stem cell transplant and chemotherapy due to multiple myeloma which is now is remission.  Pt has severe neuropathy in bil feet with no feeling during light touch filament pricks of the whole foot.  Pt also has low endurance tolerating only small amounts of activity and a fear of falling and activity which limits mobility even further.  Pt was able to walk 1:40 of a 45m31mwalk test having to stop mainly due to fear of going further.  Pt demonstrates 100% fall risk with 5x sit  to stand as well as gait speed.  pt will benefit from PT at this time to improve moblity and confidence in mobility as well as LE general strength    Personal Factors and Comorbidities Age;Comorbidity 2    Comorbidities neuropathy, chemotherapy history    Examination-Activity Limitations Stand;Locomotion Level;Stairs    Examination-Participation Restrictions Community Activity    Stability/Clinical Decision Making Stable/Uncomplicated    Clinical Decision Making Low    Rehab Potential Good    PT Frequency 2x / week    PT Duration 6 weeks    PT Treatment/Interventions ADLs/Self Care Home Management;Therapeutic exercise;Patient/family education;Gait training;Neuromuscular re-education;Balance training;DME Instruction    PT Next Visit Plan *use belt*  get a walker?, start with gait in clinic, activities in parallel bars for balance and general seated strength (eventually berg)    Consulted and Agree with Plan of Care Patient           Patient will benefit from skilled therapeutic intervention in order to improve the following deficits and impairments:  Decreased strength,Abnormal gait,Decreased activity tolerance  Visit  Diagnosis: Multiple myeloma in remission ([place])  Other abnormalities of gait and mobility  Decreased functional mobility and endurance     Problem List Patient Active Problem List   Diagnosis Date Noted  . Spondylolisthesis of lumbar region 01/05/2020  . Generalized abdominal pain 06/03/2019  . Port-A-Cath in place 08/21/2018  . H/O autologous stem cell transplant ([place]) 07/01/2017  . Autologous donor of stem cells 05/20/2017  . Phlebitis and thrombophlebitis of superficial vessels of right lower extremity 01/24/2017  . Multiple myeloma in remission ([place]) 01/06/2017  . Neuropathy 01/06/2017  . Hypercholesteremia 11/04/2016  . Class 1 obesity due to excess calories with serious comorbidity and body mass index (BMI) of 31.0 to 31.9 in adult 11/04/2016  . Gastroesophageal reflux disease without esophagitis 10/30/2016  . Abdominal pain, chronic, epigastric 10/30/2016  . OBSTRUCTIVE SLEEP APNEA 08/15/2009  . BEN HTN HEART DISEASE WITHOUT HEART FAIL 08/15/2009  . Uncontrolled type 2 diabetes mellitus with complication, without long-term current use of insulin ([place]) 07/27/2009  . TOBACCO ABUSE 07/27/2009  . Essential hypertension, benign 07/27/2009    Stark Bray 11/07/2020, 5:24 PM  [place], [place], [address] Phone: [phone]   Fax:  [phone]  Name: Christine Garrett MRN: [id number] Date of Birth: 29-Sep-1955

## 2020-11-23 ENCOUNTER — Encounter: Payer: Self-pay | Admitting: Rehabilitation

## 2020-11-28 ENCOUNTER — Other Ambulatory Visit: Payer: Self-pay

## 2020-11-28 ENCOUNTER — Encounter: Payer: Self-pay | Admitting: Rehabilitation

## 2020-11-28 ENCOUNTER — Ambulatory Visit: Payer: Medicare HMO | Attending: Hematology | Admitting: Rehabilitation

## 2020-11-28 DIAGNOSIS — R2689 Other abnormalities of gait and mobility: Secondary | ICD-10-CM | POA: Insufficient documentation

## 2020-11-28 DIAGNOSIS — C9001 Multiple myeloma in remission: Secondary | ICD-10-CM | POA: Diagnosis not present

## 2020-11-28 DIAGNOSIS — Z7409 Other reduced mobility: Secondary | ICD-10-CM | POA: Insufficient documentation

## 2020-11-28 NOTE — Therapy (Signed)
[place], [place], [address] Phone: [phone]   Fax:  [phone]  Physical Therapy Treatment  Patient Details  Name: Christine Garrett MRN: [id number] Date of Birth: [birth year] Referring Provider (PT): Dr. Irene Limbo   Encounter Date: 11/28/2020   PT End of Session - 11/28/20 1448     Visit Number 2    Number of Visits 13    Date for PT Re-Evaluation 12/26/20    PT Start Time 1406    PT Stop Time 1445    PT Time Calculation (min) 39 min    Activity Tolerance Patient tolerated treatment well    Behavior During Therapy [facility] for tasks assessed/performed             Past Medical History:  Diagnosis Date   Arthritis    knees   Cancer ([place])    Carpal tunnel syndrome    CHF (congestive heart failure) ([place])    Diabetes mellitus    type 2   GERD (gastroesophageal reflux disease)    Heart murmur    "Little, No concerns" per Dr  Dannielle Burn pt reported.   Hypertension    controlled using a guided approch with plasma renin activity   Multiple myeloma not having achieved remission ([place]) 01/06/2017   Neuropathy    Peripheral vascular disease ([place])    Sleep apnea    01/03/2020: per patient, hasn't used CPAP machine in two years due to inability to breathe well with it on    Past Surgical History:  Procedure Laterality Date   BIOPSY  11/28/2016   Procedure: BIOPSY;  Surgeon: Rogene Houston, MD;  [facility];  Service: Endoscopy;;  gastric   BIOPSY  07/02/2019   Procedure: BIOPSY;  Surgeon: Rogene Houston, MD;  [facility];  Service: Endoscopy;;   BTL     1982   CERVICAL CONE BIOPSY     cervical lesion   COLONOSCOPY WITH PROPOFOL N/A 07/02/2019   Procedure: COLONOSCOPY WITH PROPOFOL;  Surgeon: Rogene Houston, MD;  [facility];  Service: Endoscopy;  Laterality: N/A;  12:40 - pt can't come earlier due to transportation   ESOPHAGOGASTRODUODENOSCOPY (EGD) WITH PROPOFOL  N/A 11/28/2016   Procedure: ESOPHAGOGASTRODUODENOSCOPY (EGD) WITH PROPOFOL;  Surgeon: Rogene Houston, MD;  [facility];  Service: Endoscopy;  Laterality: N/A;  9:25   IR FLUORO GUIDE PORT INSERTION RIGHT  01/29/2017   IR US GUIDE VASC ACCESS RIGHT  01/29/2017   lipoma removal     right shoulder 2001   LUMBAR FUSION  01/05/2020    Lumbar Two-Three Lumbar Three-Four Lumbar Four-Five Posterior lumbar interbody fusion with decompression (N/A Spine Lumbar)   MULTIPLE EXTRACTIONS WITH ALVEOLOPLASTY Bilateral 08/24/2012   Procedure: MULTIPLE EXTRACTION WITH ALVEOLOPLASTY BIOPSY OF RIGHT AND LEFT MANDIBLE ;  Surgeon: Gae Bon, DDS;  Location: [place];  Service: Oral Surgery;  Laterality: Bilateral;   POLYPECTOMY  07/02/2019   Procedure: POLYPECTOMY;  Surgeon: Rogene Houston, MD;  [facility];  Service: Endoscopy;;   POSTERIOR LUMBAR FUSION 4 LEVEL N/A 12/26/2016   Procedure: THORACIC EIGHT TUMOR RESECTION, THORACIC SIX- THORACIC TEN POSTERIOR SPINAL FUSION;  Surgeon: Ashok Pall, MD;  Location: [place];  Service: Neurosurgery;  Laterality: N/A;  THORACIC 8 TUMOR RESECTION, THORACIC 6- THORACIC 10 POSTERIOR SPINAL FUSION   ROTATOR CUFF REPAIR     right shoulder   TUBAL LIGATION      There were no  vitals filed for this visit.   Subjective Assessment - 11/28/20 1406     Subjective No falls in the last 3 weeks    Pertinent History Multiple myeloma in remission - completed chemotherapy SCT on 07/01/17 currently on lenalidomide / revlimid, other history includes: CHF, lumbar fusion L2-5 x2, CIPN    Currently in Pain? Yes    Pain Score 6     Pain Location Back    Pain Orientation Lower    Pain Descriptors / Indicators Aching;Dull    Pain Relieving Factors just when I lay down                               [facility] Adult PT Treatment/Exercise - 11/28/20 0001       Ambulation/Gait   Gait Comments ambulated 122f x 2 to parallel bars with SPC and gait  belt with vcs to stand more upright.  Pt feeling like she is already standing upright so this was addressed more in front of th emirror at the parallel bars so pt could see a difference.  Adjusted cane lower but pt prefers it higher although it leads to some forward lean on the cane.  Pt called about getting a RW and a nurse will start to work on this      High Level Balance   High Level Balance Comments at parallel bars: standing working on upright posture with mirror cueing, standing upright with alternating lift of hands off of parallel bars, standing on airex 3x10" without hand hold CGA, side steps with work on upright posture.      Exercises   Exercises Knee/Hip;Ankle      Knee/Hip Exercises: Seated   Long Arc Quad Both;10 reps    BCardinal Healthbil x 10 with vcs not to use hands    Other Seated Knee/Hip Exercises bil ankle pumps seated x 5, seated heel raises x 10 bil,    Marching Both;5 sets                         PT Long Term Goals - 11/07/20 1722       PT LONG TERM GOAL #1   Title Pt will improve TUG to less than 20 seconds    Baseline 25.11    Time 6    Period Weeks    Status New      PT LONG TERM GOAL #2   Title Pt will improve 5 times sit to stand to at least less than 30 seconds    Baseline 40.2    Time 6    Period Weeks    Status New      PT LONG TERM GOAL #3   Title Pt will perform full 622m walk test without needing to stop    Baseline 1:40    Time 6    Period Weeks    Status New      PT LONG TERM GOAL #4   Title Pt will be ind with final HEP    Time 6    Period Weeks    Status New                   Plan - 11/28/20 1448     Clinical Impression Statement Pt tolerated treatment well today and surprised herself with what she could do overall.  No LOB or falls in the past 3 weeks or in  treatment but more fear of movement and of falling.  Worked alot of upright posture which pt is able to correct but not for long due to pain and  guarding in the low back.  Pt has started the process of getting a RW with her MD office.  Did short session to test tolerance to exercise but pt feels good upon leaving.    PT Frequency 2x / week    PT Duration 6 weeks    PT Treatment/Interventions ADLs/Self Care Home Management;Therapeutic exercise;Patient/family education;Gait training;Neuromuscular re-education;Balance training;DME Instruction    PT Next Visit Plan *use belt*  get a walker?, try supine low back stretches or seated for low back pain (pt fell after getting home from fusion and it has hurt ever since with all hardware stable per xray), STM?  gait in clinic, activities in parallel bars for balance and general seated strength (eventually berg)    Consulted and Agree with Plan of Care Patient             Patient will benefit from skilled therapeutic intervention in order to improve the following deficits and impairments:     Visit Diagnosis: Multiple myeloma in remission ([place])  Other abnormalities of gait and mobility  Decreased functional mobility and endurance     Problem List Patient Active Problem List   Diagnosis Date Noted   Spondylolisthesis of lumbar region 01/05/2020   Generalized abdominal pain 06/03/2019   Port-A-Cath in place 08/21/2018   H/O autologous stem cell transplant ([place]) 07/01/2017   Autologous donor of stem cells 05/20/2017   Phlebitis and thrombophlebitis of superficial vessels of right lower extremity 01/24/2017   Multiple myeloma in remission ([place]) 01/06/2017   Neuropathy 01/06/2017   Hypercholesteremia 11/04/2016   Class 1 obesity due to excess calories with serious comorbidity and body mass index (BMI) of 31.0 to 31.9 in adult 11/04/2016   Gastroesophageal reflux disease without esophagitis 10/30/2016   Abdominal pain, chronic, epigastric 10/30/2016   OBSTRUCTIVE SLEEP APNEA 08/15/2009   BEN HTN HEART DISEASE WITHOUT HEART FAIL 08/15/2009   Uncontrolled type 2 diabetes mellitus with  complication, without long-term current use of insulin ([place]) 07/27/2009   TOBACCO ABUSE 07/27/2009   Essential hypertension, benign 07/27/2009    Stark Bray 11/28/2020, 2:53 PM  [place], [place], [address] Phone: [phone]   Fax:  [phone]  Name: Christine Garrett MRN: [id number] Date of Birth: March 31, 1956

## 2020-11-30 ENCOUNTER — Ambulatory Visit: Payer: Medicare HMO | Admitting: Rehabilitation

## 2020-11-30 ENCOUNTER — Other Ambulatory Visit: Payer: Self-pay

## 2020-11-30 ENCOUNTER — Encounter: Payer: Self-pay | Admitting: Rehabilitation

## 2020-11-30 DIAGNOSIS — R2689 Other abnormalities of gait and mobility: Secondary | ICD-10-CM | POA: Diagnosis not present

## 2020-11-30 DIAGNOSIS — Z7409 Other reduced mobility: Secondary | ICD-10-CM | POA: Diagnosis not present

## 2020-11-30 DIAGNOSIS — C9001 Multiple myeloma in remission: Secondary | ICD-10-CM

## 2020-11-30 NOTE — Therapy (Signed)
[place], [place], [address] Phone: [phone]   Fax:  [phone]  Physical Therapy Treatment  Patient Details  Name: Christine Garrett MRN: [id number] Date of Birth: Jun 27, 1955 Referring Provider (PT): Dr. Irene Limbo   Encounter Date: 11/30/2020   PT End of Session - 11/30/20 1454     Visit Number 3    Number of Visits 13    Date for PT Re-Evaluation 12/26/20    PT Start Time 1407    PT Stop Time 1450    PT Time Calculation (min) 43 min    Activity Tolerance Patient tolerated treatment well    Behavior During Therapy [facility] for tasks assessed/performed             Past Medical History:  Diagnosis Date   Arthritis    knees   Cancer ([employer])    Carpal tunnel syndrome    CHF (congestive heart failure) ([place])    Diabetes mellitus    type 2   GERD (gastroesophageal reflux disease)    Heart murmur    "Little, No concerns" per Dr  Dannielle Burn pt reported.   Hypertension    controlled using a guided approch with plasma renin activity   Multiple myeloma not having achieved remission ([place]) 01/06/2017   Neuropathy    Peripheral vascular disease ([place])    Sleep apnea    01/03/2020: per patient, hasn't used CPAP machine in two years due to inability to breathe well with it on    Past Surgical History:  Procedure Laterality Date   BIOPSY  11/28/2016   Procedure: BIOPSY;  Surgeon: Rogene Houston, MD;  [facility];  Service: Endoscopy;;  gastric   BIOPSY  07/02/2019   Procedure: BIOPSY;  Surgeon: Rogene Houston, MD;  [facility];  Service: Endoscopy;;   BTL     1982   CERVICAL CONE BIOPSY     cervical lesion   COLONOSCOPY WITH PROPOFOL N/A 07/02/2019   Procedure: COLONOSCOPY WITH PROPOFOL;  Surgeon: Rogene Houston, MD;  [facility];  Service: Endoscopy;  Laterality: N/A;  12:40 - pt can't come earlier due to transportation   ESOPHAGOGASTRODUODENOSCOPY (EGD) WITH PROPOFOL  N/A 11/28/2016   Procedure: ESOPHAGOGASTRODUODENOSCOPY (EGD) WITH PROPOFOL;  Surgeon: Rogene Houston, MD;  [facility];  Service: Endoscopy;  Laterality: N/A;  9:25   IR FLUORO GUIDE PORT INSERTION RIGHT  01/29/2017   IR US GUIDE VASC ACCESS RIGHT  01/29/2017   lipoma removal     right shoulder 2001   LUMBAR FUSION  01/05/2020    Lumbar Two-Three Lumbar Three-Four Lumbar Four-Five Posterior lumbar interbody fusion with decompression (N/A Spine Lumbar)   MULTIPLE EXTRACTIONS WITH ALVEOLOPLASTY Bilateral 08/24/2012   Procedure: MULTIPLE EXTRACTION WITH ALVEOLOPLASTY BIOPSY OF RIGHT AND LEFT MANDIBLE ;  Surgeon: Gae Bon, DDS;  Location: [place];  Service: Oral Surgery;  Laterality: Bilateral;   POLYPECTOMY  07/02/2019   Procedure: POLYPECTOMY;  Surgeon: Rogene Houston, MD;  [facility];  Service: Endoscopy;;   POSTERIOR LUMBAR FUSION 4 LEVEL N/A 12/26/2016   Procedure: THORACIC EIGHT TUMOR RESECTION, THORACIC SIX- THORACIC TEN POSTERIOR SPINAL FUSION;  Surgeon: Ashok Pall, MD;  Location: [place];  Service: Neurosurgery;  Laterality: N/A;  THORACIC 8 TUMOR RESECTION, THORACIC 6- THORACIC 10 POSTERIOR SPINAL FUSION   ROTATOR CUFF REPAIR     right shoulder   TUBAL LIGATION      There were no  vitals filed for this visit.   Subjective Assessment - 11/30/20 1407     Subjective I felt okay after last time    Pertinent History Multiple myeloma in remission - completed chemotherapy SCT on 07/01/17 currently on lenalidomide / revlimid, other history includes: CHF, lumbar fusion L2-5 x2, CIPN    Currently in Pain? Yes    Pain Score 4     Pain Location Back    Pain Orientation Lower    Pain Descriptors / Indicators Aching;Dull    Pain Type Chronic pain    Pain Onset More than a month ago    Pain Frequency Constant                               [facility] Adult PT Treatment/Exercise - 11/30/20 0001       Ambulation/Gait   Gait Comments ambulated 167f  x 2 to parallel bars with SPC and gait belt SBA.  Gait in parallel bars with cueing on looking up and larger steps.  Pt fearful of unassisted gait at first but got more comforable with each length      Neuro Re-ed    Neuro Re-ed Details  In parallel bars: standing without support with head turns, tandem strance bil 2x15", side steps x 2 lengths Rt/Lt, standing on foam without hand hold all CGA with belt.      Exercises   Exercises Lumbar      Lumbar Exercises: Stretches   Single Knee to Chest Stretch 10 seconds    Single Knee to Chest Stretch Limitations both; using towel for Rt leg    Double Knee to Chest Stretch 1 rep;10 seconds    Lower Trunk Rotation 10 seconds    Lower Trunk Rotation Limitations movement as able      Lumbar Exercises: Supine   Clam 10 reps    Clam Limitations yellow band    Basic Lumbar Stabilization 10 reps    Basic Lumbar Stabilization Limitations 5" hold    Other Supine Lumbar Exercises ball squeeze 5" x 5                         PT Long Term Goals - 11/07/20 1722       PT LONG TERM GOAL #1   Title Pt will improve TUG to less than 20 seconds    Baseline 25.11    Time 6    Period Weeks    Status New      PT LONG TERM GOAL #2   Title Pt will improve 5 times sit to stand to at least less than 30 seconds    Baseline 40.2    Time 6    Period Weeks    Status New      PT LONG TERM GOAL #3   Title Pt will perform full 667m walk test without needing to stop    Baseline 1:40    Time 6    Period Weeks    Status New      PT LONG TERM GOAL #4   Title Pt will be ind with final HEP    Time 6    Period Weeks    Status New                   Plan - 11/30/20 1454     Clinical Impression Statement Pt did not have increased pain and soreness  after initial visit so treatment was continued with addition of supine core strengthening as well as back stretching.  Pt with more ease of movement in clinic today compared to tuewday without  as much hesitation.    PT Frequency 2x / week    PT Duration 6 weeks    PT Treatment/Interventions ADLs/Self Care Home Management;Therapeutic exercise;Patient/family education;Gait training;Neuromuscular re-education;Balance training;DME Instruction    PT Next Visit Plan *use belt*  get a walker?, try berg, supine low back stretches and strength (pt fell after getting home from fusion and it has hurt ever since with all hardware stable per xray), STM?  gait in clinic, activities in parallel bars for balance and general seated strength (eventually berg)    Consulted and Agree with Plan of Care Patient             Patient will benefit from skilled therapeutic intervention in order to improve the following deficits and impairments:     Visit Diagnosis: Multiple myeloma in remission ([place])  Other abnormalities of gait and mobility     Problem List Patient Active Problem List   Diagnosis Date Noted   Spondylolisthesis of lumbar region 01/05/2020   Generalized abdominal pain 06/03/2019   Port-A-Cath in place 08/21/2018   H/O autologous stem cell transplant ([place]) 07/01/2017   Autologous donor of stem cells 05/20/2017   Phlebitis and thrombophlebitis of superficial vessels of right lower extremity 01/24/2017   Multiple myeloma in remission ([place]) 01/06/2017   Neuropathy 01/06/2017   Hypercholesteremia 11/04/2016   Class 1 obesity due to excess calories with serious comorbidity and body mass index (BMI) of 31.0 to 31.9 in adult 11/04/2016   Gastroesophageal reflux disease without esophagitis 10/30/2016   Abdominal pain, chronic, epigastric 10/30/2016   OBSTRUCTIVE SLEEP APNEA 08/15/2009   BEN HTN HEART DISEASE WITHOUT HEART FAIL 08/15/2009   Uncontrolled type 2 diabetes mellitus with complication, without long-term current use of insulin ([place]) 07/27/2009   TOBACCO ABUSE 07/27/2009   Essential hypertension, benign 07/27/2009    Stark Bray 11/30/2020, 2:57 PM  [place], [place], [address] Phone: [phone]   Fax:  [phone]  Name: Carlisia Geno MRN: [id number] Date of Birth: Oct 10, 1955

## 2020-12-06 ENCOUNTER — Other Ambulatory Visit: Payer: Self-pay

## 2020-12-06 ENCOUNTER — Ambulatory Visit: Payer: Medicare HMO | Admitting: Rehabilitation

## 2020-12-06 DIAGNOSIS — Z7409 Other reduced mobility: Secondary | ICD-10-CM

## 2020-12-06 DIAGNOSIS — C9001 Multiple myeloma in remission: Secondary | ICD-10-CM

## 2020-12-06 DIAGNOSIS — R2689 Other abnormalities of gait and mobility: Secondary | ICD-10-CM

## 2020-12-06 NOTE — Therapy (Signed)
[place], [place], [address] Phone: [phone]   Fax:  [phone]  Physical Therapy Treatment  Patient Details  Name: Christine Garrett MRN: [id number] Date of Birth: 1956/04/04 Referring Provider (PT): Dr. Irene Limbo   Encounter Date: 12/06/2020   PT End of Session - 12/06/20 1157     Visit Number 4    Number of Visits 13    Date for PT Re-Evaluation 12/26/20    Progress Note Due on Visit 10    PT Start Time 1104    PT Stop Time 1153    PT Time Calculation (min) 49 min    Activity Tolerance Patient tolerated treatment well    Behavior During Therapy [facility] for tasks assessed/performed             Past Medical History:  Diagnosis Date   Arthritis    knees   Cancer ([place])    Carpal tunnel syndrome    CHF (congestive heart failure) ([place])    Diabetes mellitus    type 2   GERD (gastroesophageal reflux disease)    Heart murmur    "Little, No concerns" per Dr  Dannielle Burn pt reported.   Hypertension    controlled using a guided approch with plasma renin activity   Multiple myeloma not having achieved remission ([place]) 01/06/2017   Neuropathy    Peripheral vascular disease ([place])    Sleep apnea    01/03/2020: per patient, hasn't used CPAP machine in two years due to inability to breathe well with it on    Past Surgical History:  Procedure Laterality Date   BIOPSY  11/28/2016   Procedure: BIOPSY;  Surgeon: Rogene Houston, MD;  [facility];  Service: Endoscopy;;  gastric   BIOPSY  07/02/2019   Procedure: BIOPSY;  Surgeon: Rogene Houston, MD;  [facility];  Service: Endoscopy;;   BTL     1982   CERVICAL CONE BIOPSY     cervical lesion   COLONOSCOPY WITH PROPOFOL N/A 07/02/2019   Procedure: COLONOSCOPY WITH PROPOFOL;  Surgeon: Rogene Houston, MD;  [facility];  Service: Endoscopy;  Laterality: N/A;  12:40 - pt can't come earlier due to transportation    ESOPHAGOGASTRODUODENOSCOPY (EGD) WITH PROPOFOL N/A 11/28/2016   Procedure: ESOPHAGOGASTRODUODENOSCOPY (EGD) WITH PROPOFOL;  Surgeon: Rogene Houston, MD;  [facility];  Service: Endoscopy;  Laterality: N/A;  9:25   IR FLUORO GUIDE PORT INSERTION RIGHT  01/29/2017   IR US GUIDE VASC ACCESS RIGHT  01/29/2017   lipoma removal     right shoulder 2001   LUMBAR FUSION  01/05/2020    Lumbar Two-Three Lumbar Three-Four Lumbar Four-Five Posterior lumbar interbody fusion with decompression (N/A Spine Lumbar)   MULTIPLE EXTRACTIONS WITH ALVEOLOPLASTY Bilateral 08/24/2012   Procedure: MULTIPLE EXTRACTION WITH ALVEOLOPLASTY BIOPSY OF RIGHT AND LEFT MANDIBLE ;  Surgeon: Gae Bon, DDS;  Location: [place];  Service: Oral Surgery;  Laterality: Bilateral;   POLYPECTOMY  07/02/2019   Procedure: POLYPECTOMY;  Surgeon: Rogene Houston, MD;  [facility];  Service: Endoscopy;;   POSTERIOR LUMBAR FUSION 4 LEVEL N/A 12/26/2016   Procedure: THORACIC EIGHT TUMOR RESECTION, THORACIC SIX- THORACIC TEN POSTERIOR SPINAL FUSION;  Surgeon: Ashok Pall, MD;  Location: [place];  Service: Neurosurgery;  Laterality: N/A;  THORACIC 8 TUMOR RESECTION, THORACIC 6- THORACIC 10 POSTERIOR SPINAL FUSION   ROTATOR CUFF REPAIR     right shoulder   TUBAL  LIGATION      There were no vitals filed for this visit.   Subjective Assessment - 12/06/20 1105     Subjective I am doing okay    Pertinent History Multiple myeloma in remission - completed chemotherapy SCT on 07/01/17 currently on lenalidomide / revlimid, other history includes: CHF, lumbar fusion L2-5 x2, CIPN    Currently in Pain? Yes    Pain Score 5     Pain Location Back    Pain Orientation Lower    Pain Descriptors / Indicators Aching;Dull    Pain Type Chronic pain    Pain Onset More than a month ago    Pain Frequency Constant                [facility] PT Assessment - 12/06/20 0001       Berg Balance Test   Sit to Stand Needs minimal aid to  stand or to stabilize (P)    x 2 tries   Standing Unsupported Able to stand safely 2 minutes (P)     Sitting with Back Unsupported but Feet Supported on Floor or Stool Able to sit safely and securely 2 minutes (P)     Stand to Sit Sits safely with minimal use of hands (P)     Transfers Able to transfer safely, minor use of hands (P)     Standing Unsupported with Eyes Closed Able to stand 10 seconds with supervision (P)     Standing Unsupported with Feet Together Able to place feet together independently and stand for 1 minute with supervision (P)     From Standing, Reach Forward with Outstretched Arm Can reach confidently >25 cm (10") (P)     From Standing Position, Pick up Object from Floor Unable to try/needs assist to keep balance (P)     From Standing Position, Turn to Look Behind Over each Shoulder Turn sideways only but maintains balance (P)    back   Turn 360 Degrees Needs assistance while turning (P)    taking 16" and small shuffling steps   Standing Unsupported, Alternately Place Feet on Step/Stool Able to stand independently and complete 8 steps >20 seconds (P)    16"   Standing Unsupported, One Foot in [employer] balance while stepping or standing (P)    R: 5",  L: 13"   Standing on One Leg Able to lift leg independently and hold 5-10 seconds (P)    bil 7" with gait belt   Total Score 35 (P)     Berg comment: high risk of fall (P)                            [facility] Adult PT Treatment/Exercise - 12/06/20 0001       Ambulation/Gait   Gait Comments ambulated 134f x 2 to parallel bars with SPC and gait belt SBA.  x1 with vcs for fast/slow gait      Neuro Re-ed    Neuro Re-ed Details  In parallel bars: tandem strance bil 2x15",tandem gait x 2 lenghts      Lumbar Exercises: Stretches   Single Knee to Chest Stretch 10 seconds;2 reps    Single Knee to Chest Stretch Limitations both; using towel for Rt leg    Double Knee to Chest Stretch 2 reps;10 seconds    Double  Knee to Chest Stretch Limitations both; using towel    Lower Trunk Rotation 5 reps    Lower  Trunk Rotation Limitations movement as able      Lumbar Exercises: Supine   Clam 10 reps    Clam Limitations yellow band   TrA included   Other Supine Lumbar Exercises ball squeeze 5" x 5      Knee/Hip Exercises: Stretches   Passive Hamstring Stretch Both;2 reps;10 seconds    Passive Hamstring Stretch Limitations with stretch strap    Piriformis Stretch 1 rep;10 seconds    Piriformis Stretch Limitations bil                         PT Long Term Goals - 12/06/20 1158       PT LONG TERM GOAL #1   Title Pt will improve TUG to less than 20 seconds    Status On-going      PT LONG TERM GOAL #2   Title Pt will improve 5 times sit to stand to at least less than 30 seconds    Status On-going      PT LONG TERM GOAL #3   Title Pt will perform full 41mn walk test without needing to stop    Status On-going      PT LONG TERM GOAL #4   Title Pt will be ind with final HEP    Status On-going      PT LONG TERM GOAL #5   Title Pt will improve BERG to at least 40/56    Baseline 35    Status New                   Plan - 12/06/20 1157     Clinical Impression Statement Was able to perform full berg today with score of 35 ; high fall risk of almost 100%.  Pt struggled mainly with speed of movement and narrow BOS activities.    PT Duration 6 weeks    PT Treatment/Interventions ADLs/Self Care Home Management;Therapeutic exercise;Patient/family education;Gait training;Neuromuscular re-education;Balance training;DME Instruction    PT Next Visit Plan *use belt*  berg, supine low back stretches and strength (pt fell after getting home from fusion and it has hurt ever since with all hardware stable per xray), STM?  gait in clinic, activities in parallel bars for balance and general seated strength             Patient will benefit from skilled therapeutic intervention in order to  improve the following deficits and impairments:     Visit Diagnosis: [place]oma in remission [place]  Other abnormalities of gait and mobility  Decreased functional mobility and endurance     Problem List Patient Active Problem List   Diagnosis Date Noted   Spondylolisthesis of lumbar region 01/05/2020   Generalized abdominal pain 06/03/2019   Port-A-Cath in place 08/21/2018   H[place] 07/01/2017   Autologous donor of stem cells 05/20/2017   Phlebitis and thrombophlebitis of superficial vessels of right lower extremity 01/24/2017   [place] 01/06/2017   Neuropathy 01/06/2017   Hypercholesteremia 11/04/2016   Class 1 obesity due to excess calories with serious comorbidity and body mass index (BMI) of 31.0 to 31.9 in adult 11/04/2016   Gastroesophageal reflux disease without esophagitis 10/30/2016   Abdominal pain, chronic, epigastric 10/30/2016   OBSTRUCTIVE SLEEP APNEA 08/15/2009   BEN HTN HEART DISEASE WITHOUT HEART FAIL 08/15/2009   Uncontrolled type 2 diabetes mellitus with complication, without long-term current use of insulin ([place] 07/27/2009   TOBACCO ABUSE  07/27/2009   Essential hypertension, benign 07/27/2009    Stark Bray 12/06/2020, 12:00 PM  [place], [place], [address] Phone: [phone]   Fax:  [phone]  Name: Christine Garrett MRN: [id number] Date of Birth: 09/19/1955

## 2020-12-07 ENCOUNTER — Telehealth: Payer: Self-pay | Admitting: Hematology

## 2020-12-07 NOTE — Telephone Encounter (Signed)
Scheduled appointment per 06/23 sch msg. Patient is aware. 

## 2020-12-11 DIAGNOSIS — Z0000 Encounter for general adult medical examination without abnormal findings: Secondary | ICD-10-CM | POA: Diagnosis not present

## 2020-12-11 DIAGNOSIS — I5042 Chronic combined systolic (congestive) and diastolic (congestive) heart failure: Secondary | ICD-10-CM | POA: Diagnosis not present

## 2020-12-11 DIAGNOSIS — M4807 Spinal stenosis, lumbosacral region: Secondary | ICD-10-CM | POA: Diagnosis not present

## 2020-12-11 DIAGNOSIS — Z86711 Personal history of pulmonary embolism: Secondary | ICD-10-CM | POA: Diagnosis not present

## 2020-12-11 DIAGNOSIS — E1121 Type 2 diabetes mellitus with diabetic nephropathy: Secondary | ICD-10-CM | POA: Diagnosis not present

## 2020-12-11 DIAGNOSIS — I10 Essential (primary) hypertension: Secondary | ICD-10-CM | POA: Diagnosis not present

## 2020-12-12 ENCOUNTER — Other Ambulatory Visit: Payer: Self-pay

## 2020-12-12 ENCOUNTER — Ambulatory Visit: Payer: Medicare HMO

## 2020-12-12 DIAGNOSIS — Z7409 Other reduced mobility: Secondary | ICD-10-CM | POA: Diagnosis not present

## 2020-12-12 DIAGNOSIS — R2689 Other abnormalities of gait and mobility: Secondary | ICD-10-CM

## 2020-12-12 DIAGNOSIS — C9001 Multiple myeloma in remission: Secondary | ICD-10-CM

## 2020-12-12 NOTE — Therapy (Signed)
[place], [place], [address] Phone: [phone]   Fax:  [phone]  Physical Therapy Treatment  Patient Details  Name: Christine Garrett MRN: [id number] Date of Birth: 25-Feb-1956 Referring Provider (PT): Dr. Irene Limbo   Encounter Date: 12/12/2020   PT End of Session - 12/12/20 1106     Visit Number 5    Number of Visits 13    Date for PT Re-Evaluation 12/26/20    Progress Note Due on Visit 10    PT Start Time 1006    PT Stop Time 1104    PT Time Calculation (min) 58 min    Activity Tolerance Patient tolerated treatment well    Behavior During Therapy [facility] for tasks assessed/performed             Past Medical History:  Diagnosis Date   Arthritis    knees   Cancer ([place])    Carpal tunnel syndrome    CHF (congestive heart failure) ([place])    Diabetes mellitus    type 2   GERD (gastroesophageal reflux disease)    Heart murmur    "Little, No concerns" per Dr  Dannielle Burn pt reported.   Hypertension    controlled using a guided approch with plasma renin activity   Multiple myeloma not having achieved remission ([place]) 01/06/2017   Neuropathy    Peripheral vascular disease ([place])    Sleep apnea    01/03/2020: per patient, hasn't used CPAP machine in two years due to inability to breathe well with it on    Past Surgical History:  Procedure Laterality Date   BIOPSY  11/28/2016   Procedure: BIOPSY;  Surgeon: Rogene Houston, MD;  [facility];  Service: Endoscopy;;  gastric   BIOPSY  07/02/2019   Procedure: BIOPSY;  Surgeon: Rogene Houston, MD;  [facility];  Service: Endoscopy;;   BTL     1982   CERVICAL CONE BIOPSY     cervical lesion   COLONOSCOPY WITH PROPOFOL N/A 07/02/2019   Procedure: COLONOSCOPY WITH PROPOFOL;  Surgeon: Rogene Houston, MD;  [facility];  Service: Endoscopy;  Laterality: N/A;  12:40 - pt can't come earlier due to transportation    ESOPHAGOGASTRODUODENOSCOPY (EGD) WITH PROPOFOL N/A 11/28/2016   Procedure: ESOPHAGOGASTRODUODENOSCOPY (EGD) WITH PROPOFOL;  Surgeon: Rogene Houston, MD;  [facility];  Service: Endoscopy;  Laterality: N/A;  9:25   IR FLUORO GUIDE PORT INSERTION RIGHT  01/29/2017   IR US GUIDE VASC ACCESS RIGHT  01/29/2017   lipoma removal     right shoulder 2001   LUMBAR FUSION  01/05/2020    Lumbar Two-Three Lumbar Three-Four Lumbar Four-Five Posterior lumbar interbody fusion with decompression (N/A Spine Lumbar)   MULTIPLE EXTRACTIONS WITH ALVEOLOPLASTY Bilateral 08/24/2012   Procedure: MULTIPLE EXTRACTION WITH ALVEOLOPLASTY BIOPSY OF RIGHT AND LEFT MANDIBLE ;  Surgeon: Gae Bon, DDS;  Location: [place];  Service: Oral Surgery;  Laterality: Bilateral;   POLYPECTOMY  07/02/2019   Procedure: POLYPECTOMY;  Surgeon: Rogene Houston, MD;  [facility];  Service: Endoscopy;;   POSTERIOR LUMBAR FUSION 4 LEVEL N/A 12/26/2016   Procedure: THORACIC EIGHT TUMOR RESECTION, THORACIC SIX- THORACIC TEN POSTERIOR SPINAL FUSION;  Surgeon: Ashok Pall, MD;  Location: [place];  Service: Neurosurgery;  Laterality: N/A;  THORACIC 8 TUMOR RESECTION, THORACIC 6- THORACIC 10 POSTERIOR SPINAL FUSION   ROTATOR CUFF REPAIR     right shoulder   TUBAL  LIGATION      There were no vitals filed for this visit.                      [place] Adult PT Treatment/Exercise - 12/12/20 0001       Neuro Re-ed    Neuro Re-ed Details  In parallel bars: Bil tandem walk front and retro 2x each VCs for erect posture throughout; slow and controlled high knee marching 2x each; toe walking 2x with 3 seated rest breaks throughout      Lumbar Exercises: Supine   Pelvic Tilt 10 reps    Clam 20 reps   2 x 10 reps with pelvic tilt   Bent Knee Raise 10 reps   holding pelvic tilt   Bridge 5 reps    Straight Leg Raise 10 reps   Rt and Lt LE   Straight Leg Raises Limitations Pt returned therapist demo      Knee/Hip  Exercises: Aerobic   Nustep Level 5, x3 mins wiht therapist monitoring      Knee/Hip Exercises: Standing   Hip Flexion AROM;Right;Left;10 reps    Hip Flexion Limitations VCs for slow pace    Hip Abduction AROM;Right;Left;10 reps    Abduction Limitations VCs for no hip er    Hip Extension AROM;Right;Left;2 sets;5 reps    Extension Limitations Pt returned therapis demo and VCs for no forward lean                         PT Long Term Goals - 12/06/20 1158       PT LONG TERM GOAL #1   Title Pt will improve TUG to less than 20 seconds    Status On-going      PT LONG TERM GOAL #2   Title Pt will improve 5 times sit to stand to at least less than 30 seconds    Status On-going      PT LONG TERM GOAL #3   Title Pt will perform full 18mn walk test without needing to stop    Status On-going      PT LONG TERM GOAL #4   Title Pt will be ind with final HEP    Status On-going      PT LONG TERM GOAL #5   Title Pt will improve BERG to at least 40/56    Baseline 35    Status New                   Plan - 12/12/20 1107     Clinical Impression Statement Added NuStep today which pt tolerated for about 3 mins before reporting increased weakness in Rt LE and needed to stop. Then continued with work in the // bars focusing more on dynamic balance activities which pt did well with but did require multiple seated rest breaks due to fatigue. Encouraged her to work on being compliant with HEP as hse reports she doesn't do the standing activities very often.    Personal Factors and Comorbidities Age;Comorbidity 2    Comorbidities neuropathy, chemotherapy history    Examination-Activity Limitations Stand;Locomotion Level;Stairs    Examination-Participation Restrictions Community Activity    Stability/Clinical Decision Making Stable/Uncomplicated    Rehab Potential Good    PT Frequency 2x / week    PT Duration 6 weeks    PT Treatment/Interventions ADLs/Self Care Home  Management;Therapeutic exercise;Patient/family education;Gait training;Neuromuscular re-education;Balance training;DME Instruction    PT Next Visit Plan *  use belt*  berg, supine low back stretches and strength (pt fell after getting home from fusion and it has hurt ever since with all hardware stable per xray), STM?  gait in clinic, activities in parallel bars for balance and general seated strength    Consulted and Agree with Plan of Care Patient             Patient will benefit from skilled therapeutic intervention in order to improve the following deficits and impairments:  Decreased strength, Abnormal gait, Decreased activity tolerance  Visit Diagnosis: Multiple myeloma in remission ([place])  Other abnormalities of gait and mobility  Decreased functional mobility and endurance     Problem List Patient Active Problem List   Diagnosis Date Noted   Spondylolisthesis of lumbar region 01/05/2020   Generalized abdominal pain 06/03/2019   Port-A-Cath in place 08/21/2018   H/O autologous stem cell transplant ([place]) 07/01/2017   Autologous donor of stem cells 05/20/2017   Phlebitis and thrombophlebitis of superficial vessels of right lower extremity 01/24/2017   Multiple myeloma in remission ([place]) 01/06/2017   Neuropathy 01/06/2017   Hypercholesteremia 11/04/2016   Class 1 obesity due to excess calories with serious comorbidity and body mass index (BMI) of 31.0 to 31.9 in adult 11/04/2016   Gastroesophageal reflux disease without esophagitis 10/30/2016   Abdominal pain, chronic, epigastric 10/30/2016   OBSTRUCTIVE SLEEP APNEA 08/15/2009   BEN HTN HEART DISEASE WITHOUT HEART FAIL 08/15/2009   Uncontrolled type 2 diabetes mellitus with complication, without long-term current use of insulin ([place]) 07/27/2009   TOBACCO ABUSE 07/27/2009   Essential hypertension, benign 07/27/2009    Otelia Limes, PTA 12/12/2020, 12:04 PM  [place], [place], [address] Phone: [phone]   Fax:  [phone]  Name: Christine Garrett MRN: [id number] Date of Birth: [birth year]

## 2020-12-19 ENCOUNTER — Ambulatory Visit: Payer: Medicare HMO | Attending: Hematology

## 2020-12-19 ENCOUNTER — Other Ambulatory Visit: Payer: Self-pay

## 2020-12-19 DIAGNOSIS — R2689 Other abnormalities of gait and mobility: Secondary | ICD-10-CM | POA: Insufficient documentation

## 2020-12-19 DIAGNOSIS — C9001 Multiple myeloma in remission: Secondary | ICD-10-CM | POA: Diagnosis not present

## 2020-12-19 DIAGNOSIS — Z7409 Other reduced mobility: Secondary | ICD-10-CM | POA: Insufficient documentation

## 2020-12-19 NOTE — Therapy (Signed)
[place], [place], [address] Phone: [phone]   Fax:  [phone]  Physical Therapy Treatment  Patient Details  Name: Christine Garrett MRN: [id number] Date of Birth: 1955-12-22 Referring Provider (PT): Dr. Irene Limbo   Encounter Date: 12/19/2020   PT End of Session - 12/19/20 1227     Visit Number 6    Number of Visits 13    Authorization Type MCR/MCD    Progress Note Due on Visit 10    PT Start Time 1006    PT Stop Time 1055    PT Time Calculation (min) 49 min    Activity Tolerance Patient tolerated treatment well    Behavior During Therapy [facility] for tasks assessed/performed             Past Medical History:  Diagnosis Date   Arthritis    knees   Cancer ([place])    Carpal tunnel syndrome    CHF (congestive heart failure) ([place])    Diabetes mellitus    type 2   GERD (gastroesophageal reflux disease)    Heart murmur    "Little, No concerns" per Dr  Dannielle Burn pt reported.   Hypertension    controlled using a guided approch with plasma renin activity   Multiple myeloma not having achieved remission ([place]) 01/06/2017   Neuropathy    Peripheral vascular disease ([place])    Sleep apnea    01/03/2020: per patient, hasn't used CPAP machine in two years due to inability to breathe well with it on    Past Surgical History:  Procedure Laterality Date   BIOPSY  11/28/2016   Procedure: BIOPSY;  Surgeon: Rogene Houston, MD;  [facility];  Service: Endoscopy;;  gastric   BIOPSY  07/02/2019   Procedure: BIOPSY;  Surgeon: Rogene Houston, MD;  [facility];  Service: Endoscopy;;   BTL     1982   CERVICAL CONE BIOPSY     cervical lesion   COLONOSCOPY WITH PROPOFOL N/A 07/02/2019   Procedure: COLONOSCOPY WITH PROPOFOL;  Surgeon: Rogene Houston, MD;  [facility];  Service: Endoscopy;  Laterality: N/A;  12:40 - pt can't come earlier due to transportation    ESOPHAGOGASTRODUODENOSCOPY (EGD) WITH PROPOFOL N/A 11/28/2016   Procedure: ESOPHAGOGASTRODUODENOSCOPY (EGD) WITH PROPOFOL;  Surgeon: Rogene Houston, MD;  [facility];  Service: Endoscopy;  Laterality: N/A;  9:25   IR FLUORO GUIDE PORT INSERTION RIGHT  01/29/2017   IR US GUIDE VASC ACCESS RIGHT  01/29/2017   lipoma removal     right shoulder 2001   LUMBAR FUSION  01/05/2020    Lumbar Two-Three Lumbar Three-Four Lumbar Four-Five Posterior lumbar interbody fusion with decompression (N/A Spine Lumbar)   MULTIPLE EXTRACTIONS WITH ALVEOLOPLASTY Bilateral 08/24/2012   Procedure: MULTIPLE EXTRACTION WITH ALVEOLOPLASTY BIOPSY OF RIGHT AND LEFT MANDIBLE ;  Surgeon: Gae Bon, DDS;  Location: [place];  Service: Oral Surgery;  Laterality: Bilateral;   POLYPECTOMY  07/02/2019   Procedure: POLYPECTOMY;  Surgeon: Rogene Houston, MD;  [facility];  Service: Endoscopy;;   POSTERIOR LUMBAR FUSION 4 LEVEL N/A 12/26/2016   Procedure: THORACIC EIGHT TUMOR RESECTION, THORACIC SIX- THORACIC TEN POSTERIOR SPINAL FUSION;  Surgeon: Ashok Pall, MD;  Location: [place];  Service: Neurosurgery;  Laterality: N/A;  THORACIC 8 TUMOR RESECTION, THORACIC 6- THORACIC 10 POSTERIOR SPINAL FUSION   ROTATOR CUFF REPAIR     right shoulder   TUBAL LIGATION  There were no vitals filed for this visit.   Subjective Assessment - 12/19/20 1001     Subjective Did well after last visit. Got a little sore but I go home and shower and the soreness in my back gets better.    Pertinent History Multiple myeloma in remission - completed chemotherapy SCT on 07/01/17 currently on lenalidomide / revlimid, other history includes: CHF, lumbar fusion L2-5 x2, CIPN    Limitations Standing;Walking    How long can you walk comfortably? I get rides, I use the buggy    Currently in Pain? Yes    Pain Score 5     Pain Location Back    Pain Orientation Right;Left    Pain Descriptors / Indicators Aching;Dull    Pain Type  Chronic pain    Pain Onset More than a month ago    Pain Frequency Constant    Multiple Pain Sites No                               [facility] Adult PT Treatment/Exercise - 12/19/20 0001       Ambulation/Gait   Gait Comments ambulated 125f x 2 to parallel bars with SPC and gait belt SBA.      Neuro Re-ed    Neuro Re-ed Details  In parallel bars: Bil tandem walk front and retro 2x each, and sidestepping VCs for erect posture throughout; slow and controlled high knee marching 2x each; toe walking 2x with 3 seated rest breaks throughout      Knee/Hip Exercises: Standing   Hip Flexion AROM;Right;Left;10 reps    Hip Abduction AROM;Right;Left;10 reps    Abduction Limitations VCs for no hip er    Hip Extension AROM;Right;Left;2 sets;5 reps    Forward Step Up Right;Left;2 sets;5 reps   foot placement without hand hold.  CGA of PT     Knee/Hip Exercises: Seated   Clamshell with TheraBand Red   15 reps   Sit to Sand 2 sets;5 reps   high table, VC's to get head over feet     Shoulder Exercises: Standing   Extension Strengthening;Both;12 reps   done in standingwith table behind;PT holding band   Theraband Level (Shoulder Extension) Level 2 (Red)    Retraction Strengthening;Both;15 reps   done in standing with table behind   Theraband Level (Shoulder Retraction) Level 2 (Red)                         PT Long Term Goals - 12/06/20 1158       PT LONG TERM GOAL #1   Title Pt will improve TUG to less than 20 seconds    Status On-going      PT LONG TERM GOAL #2   Title Pt will improve 5 times sit to stand to at least less than 30 seconds    Status On-going      PT LONG TERM GOAL #3   Title Pt will perform full 681m walk test without needing to stop    Status On-going      PT LONG TERM GOAL #4   Title Pt will be ind with final HEP    Status On-going      PT LONG TERM GOAL #5   Title Pt will improve BERG to at least 40/56    Baseline 35    Status New  Plan - 12/19/20 1227     Clinical Impression Statement Continued emphasis on gait and balance, and added some standing postural exercises, and practice with sit to stand.  Pt continues to need intermittent rest breaks and cueing to maintain erect posture with exercises.  She had several small LOB which she was able to correct herself when doing foot placement on step.  Treatment was performed in gait belt with Supervision to CGA but did not require PT assist.    Personal Factors and Comorbidities Age;Comorbidity 2    Comorbidities neuropathy, chemotherapy history    Examination-Activity Limitations Stand;Locomotion Level;Stairs    Examination-Participation Restrictions Community Activity    Stability/Clinical Decision Making Stable/Uncomplicated    Rehab Potential Good    PT Frequency 2x / week    PT Duration 6 weeks    PT Treatment/Interventions ADLs/Self Care Home Management;Therapeutic exercise;Patient/family education;Gait training;Neuromuscular re-education;Balance training;DME Instruction    PT Next Visit Plan *use belt*  berg, supine low back stretches and strength (pt fell after getting home from fusion and it has hurt ever since with all hardware stable per xray), STM?  gait in clinic, activities in parallel bars for balance and general seated strength    Consulted and Agree with Plan of Care Patient             Patient will benefit from skilled therapeutic intervention in order to improve the following deficits and impairments:  Decreased strength, Abnormal gait, Decreased activity tolerance  Visit Diagnosis: Multiple myeloma in remission ([place])  Other abnormalities of gait and mobility  Decreased functional mobility and endurance     Problem List Patient Active Problem List   Diagnosis Date Noted   Spondylolisthesis of lumbar region 01/05/2020   Generalized abdominal pain 06/03/2019   Port-A-Cath in place 08/21/2018   H/O autologous stem  cell transplant ([place]) 07/01/2017   Autologous donor of stem cells 05/20/2017   Phlebitis and thrombophlebitis of superficial vessels of right lower extremity 01/24/2017   Multiple myeloma in remission ([place]) 01/06/2017   Neuropathy 01/06/2017   Hypercholesteremia 11/04/2016   Class 1 obesity due to excess calories with serious comorbidity and body mass index (BMI) of 31.0 to 31.9 in adult 11/04/2016   Gastroesophageal reflux disease without esophagitis 10/30/2016   Abdominal pain, chronic, epigastric 10/30/2016   OBSTRUCTIVE SLEEP APNEA 08/15/2009   BEN HTN HEART DISEASE WITHOUT HEART FAIL 08/15/2009   Uncontrolled type 2 diabetes mellitus with complication, without long-term current use of insulin ([place]) 07/27/2009   TOBACCO ABUSE 07/27/2009   Essential hypertension, benign 07/27/2009    Elsie Ra Michele Kerlin 12/19/2020, 12:32 PM  [place], [place], [address] Phone: [phone]   Fax:  [phone]  Name: Iyah Laguna MRN: [id number] Date of Birth: [birth year] Cheral Almas, PT 12/19/20 12:33 PM

## 2020-12-28 ENCOUNTER — Other Ambulatory Visit: Payer: Self-pay

## 2020-12-28 ENCOUNTER — Encounter: Payer: Self-pay | Admitting: Rehabilitation

## 2020-12-28 ENCOUNTER — Ambulatory Visit: Payer: Medicare HMO | Admitting: Rehabilitation

## 2020-12-28 ENCOUNTER — Inpatient Hospital Stay: Payer: Medicare HMO | Attending: Hematology

## 2020-12-28 DIAGNOSIS — Z95828 Presence of other vascular implants and grafts: Secondary | ICD-10-CM

## 2020-12-28 DIAGNOSIS — Z452 Encounter for adjustment and management of vascular access device: Secondary | ICD-10-CM | POA: Diagnosis not present

## 2020-12-28 DIAGNOSIS — C9001 Multiple myeloma in remission: Secondary | ICD-10-CM | POA: Diagnosis not present

## 2020-12-28 DIAGNOSIS — R2689 Other abnormalities of gait and mobility: Secondary | ICD-10-CM | POA: Diagnosis not present

## 2020-12-28 DIAGNOSIS — Z7409 Other reduced mobility: Secondary | ICD-10-CM | POA: Diagnosis not present

## 2020-12-28 MED ORDER — HEPARIN SOD (PORK) LOCK FLUSH 100 UNIT/ML IV SOLN
500.0000 [IU] | Freq: Once | INTRAVENOUS | Status: AC
Start: 2020-12-28 — End: 2020-12-28
  Administered 2020-12-28: 500 [IU]
  Filled 2020-12-28: qty 5

## 2020-12-28 MED ORDER — SODIUM CHLORIDE 0.9% FLUSH
10.0000 mL | Freq: Once | INTRAVENOUS | Status: AC
Start: 2020-12-28 — End: 2020-12-28
  Administered 2020-12-28: 10 mL
  Filled 2020-12-28: qty 10

## 2020-12-28 NOTE — Therapy (Signed)
[place], [place], [address] Phone: [phone]   Fax:  [phone]  Physical Therapy Treatment  Patient Details  Name: Christine Garrett MRN: [id number] Date of Birth: Feb 03, 1956 Referring Provider (PT): Dr. Irene Limbo   Encounter Date: 12/28/2020   PT End of Session - 12/28/20 1214     Visit Number 8    Number of Visits 13    Date for PT Re-Evaluation 01/18/21    PT Start Time 1055    PT Stop Time 1142    PT Time Calculation (min) 47 min    Activity Tolerance Patient tolerated treatment well    Behavior During Therapy [facility] for tasks assessed/performed             Past Medical History:  Diagnosis Date   Arthritis    knees   Cancer ([place])    Carpal tunnel syndrome    CHF (congestive heart failure) ([place])    Diabetes mellitus    type 2   GERD (gastroesophageal reflux disease)    Heart murmur    "Little, No concerns" per Dr  Dannielle Burn pt reported.   Hypertension    controlled using a guided approch with plasma renin activity   Multiple myeloma not having achieved remission ([place]) 01/06/2017   Neuropathy    Peripheral vascular disease ([place])    Sleep apnea    01/03/2020: per patient, hasn't used CPAP machine in two years due to inability to breathe well with it on    Past Surgical History:  Procedure Laterality Date   BIOPSY  11/28/2016   Procedure: BIOPSY;  Surgeon: Rogene Houston, MD;  [facility];  Service: Endoscopy;;  gastric   BIOPSY  07/02/2019   Procedure: BIOPSY;  Surgeon: Rogene Houston, MD;  [facility];  Service: Endoscopy;;   BTL     1982   CERVICAL CONE BIOPSY     cervical lesion   COLONOSCOPY WITH PROPOFOL N/A 07/02/2019   Procedure: COLONOSCOPY WITH PROPOFOL;  Surgeon: Rogene Houston, MD;  [facility];  Service: Endoscopy;  Laterality: N/A;  12:40 - pt can't come earlier due to transportation   ESOPHAGOGASTRODUODENOSCOPY (EGD) WITH PROPOFOL  N/A 11/28/2016   Procedure: ESOPHAGOGASTRODUODENOSCOPY (EGD) WITH PROPOFOL;  Surgeon: Rogene Houston, MD;  [facility];  Service: Endoscopy;  Laterality: N/A;  9:25   IR FLUORO GUIDE PORT INSERTION RIGHT  01/29/2017   IR US GUIDE VASC ACCESS RIGHT  01/29/2017   lipoma removal     right shoulder 2001   LUMBAR FUSION  01/05/2020    Lumbar Two-Three Lumbar Three-Four Lumbar Four-Five Posterior lumbar interbody fusion with decompression (N/A Spine Lumbar)   MULTIPLE EXTRACTIONS WITH ALVEOLOPLASTY Bilateral 08/24/2012   Procedure: MULTIPLE EXTRACTION WITH ALVEOLOPLASTY BIOPSY OF RIGHT AND LEFT MANDIBLE ;  Surgeon: Gae Bon, DDS;  Location: [facility];  Service: Oral Surgery;  Laterality: Bilateral;   POLYPECTOMY  07/02/2019   Procedure: POLYPECTOMY;  Surgeon: Rogene Houston, MD;  [facility];  Service: Endoscopy;;   POSTERIOR LUMBAR FUSION 4 LEVEL N/A 12/26/2016   Procedure: THORACIC EIGHT TUMOR RESECTION, THORACIC SIX- THORACIC TEN POSTERIOR SPINAL FUSION;  Surgeon: Ashok Pall, MD;  Location: [facility];  Service: Neurosurgery;  Laterality: N/A;  THORACIC 8 TUMOR RESECTION, THORACIC 6- THORACIC 10 POSTERIOR SPINAL FUSION   ROTATOR CUFF REPAIR     right shoulder   TUBAL LIGATION      There were no  vitals filed for this visit.   Subjective Assessment - 12/28/20 1053     Subjective My arms are sore I was weeding yesterday    Pertinent History Multiple myeloma in remission - completed chemotherapy SCT on 07/01/17 currently on lenalidomide / revlimid, other history includes: CHF, lumbar fusion L2-5 x2, CIPN    Currently in Pain? Yes    Pain Score 5     Pain Location Back    Pain Orientation Mid;Lower    Pain Descriptors / Indicators Aching;Dull    Pain Type Chronic pain    Pain Onset More than a month ago    Pain Frequency Constant                               [facility] Adult PT Treatment/Exercise - 12/28/20 0001       Neuro Re-ed    Neuro Re-ed  Details  In parallel bars: on blue foam: no hold feet together and tandem stance 3xbil 15" each with CGA as needed.  Pt nervous about feeling shaky during foam stance but was encouraged to see that even PT is shaky when doing the same.      Exercises   Exercises Other Exercises    Other Exercises  performed OTAGO with instruction handout for patient 2# weights used for LEs; omitted trunk extension.  Performed balance exercises in parallel bars.                         PT Long Term Goals - 12/06/20 1158       PT LONG TERM GOAL #1   Title Pt will improve TUG to less than 20 seconds    Status On-going      PT LONG TERM GOAL #2   Title Pt will improve 5 times sit to stand to at least less than 30 seconds    Status On-going      PT LONG TERM GOAL #3   Title Pt will perform full 31mn walk test without needing to stop    Status On-going      PT LONG TERM GOAL #4   Title Pt will be ind with final HEP    Status On-going      PT LONG TERM GOAL #5   Title Pt will improve BERG to at least 40/56    Baseline 35    Status New                   Plan - 12/28/20 1214     Clinical Impression Statement Extended POC x 3 weeks to finish up visit numbers.  Added more unstable surface today which pt was fearful of being shaky with but did very well.  Education on appropriateness of shakiness and how we challenge balance.    PT Treatment/Interventions ADLs/Self Care Home Management;Therapeutic exercise;Patient/family education;Gait training;Neuromuscular re-education;Balance training;DME Instruction    PT Next Visit Plan *use belt*  supine low back stretches and strength (pt fell after getting home from fusion and it has hurt ever since with all hardware stable per xray), gait in clinic, activities in parallel bars for balance and general seated strength    PT [place] LBP exercises    Consulted and Agree with Plan of Care Patient              Patient will benefit from skilled therapeutic intervention in order to improve  the following deficits and impairments:     Visit Diagnosis: Multiple myeloma in remission ([place])  Other abnormalities of gait and mobility  Decreased functional mobility and endurance     Problem List Patient Active Problem List   Diagnosis Date Noted   Spondylolisthesis of lumbar region 01/05/2020   Generalized abdominal pain 06/03/2019   Port-A-Cath in place 08/21/2018   H/O autologous stem cell transplant ([place]) 07/01/2017   Autologous donor of stem cells 05/20/2017   Phlebitis and thrombophlebitis of superficial vessels of right lower extremity 01/24/2017   Multiple myeloma in remission ([place]) 01/06/2017   Neuropathy 01/06/2017   Hypercholesteremia 11/04/2016   Class 1 obesity due to excess calories with serious comorbidity and body mass index (BMI) of 31.0 to 31.9 in adult 11/04/2016   Gastroesophageal reflux disease without esophagitis 10/30/2016   Abdominal pain, chronic, epigastric 10/30/2016   OBSTRUCTIVE SLEEP APNEA 08/15/2009   BEN HTN HEART DISEASE WITHOUT HEART FAIL 08/15/2009   Uncontrolled type 2 diabetes mellitus with complication, without long-term current use of insulin ([place]) 07/27/2009   TOBACCO ABUSE 07/27/2009   Essential hypertension, benign 07/27/2009    Stark Bray 12/28/2020, 12:17 PM  [place], [place], [address] Phone: [phone]   Fax:  [phone]  Name: Christine Garrett MRN: [id number] Date of Birth: 1956-06-15

## 2021-01-02 ENCOUNTER — Other Ambulatory Visit: Payer: Self-pay

## 2021-01-02 ENCOUNTER — Ambulatory Visit: Payer: Medicare HMO | Admitting: Rehabilitation

## 2021-01-02 ENCOUNTER — Encounter: Payer: Self-pay | Admitting: Rehabilitation

## 2021-01-02 DIAGNOSIS — C9001 Multiple myeloma in remission: Secondary | ICD-10-CM | POA: Diagnosis not present

## 2021-01-02 DIAGNOSIS — Z7409 Other reduced mobility: Secondary | ICD-10-CM | POA: Diagnosis not present

## 2021-01-02 DIAGNOSIS — R2689 Other abnormalities of gait and mobility: Secondary | ICD-10-CM

## 2021-01-02 NOTE — Therapy (Signed)
[place], [place], [address] Phone: [phone]   Fax:  [phone]  Physical Therapy Treatment  Patient Details  Name: Christine Garrett MRN: [id number] Date of Birth: 1956-02-06 Referring Provider (PT): Dr. Irene Limbo   Encounter Date: 01/02/2021   PT End of Session - 01/02/21 1147     Visit Number 9    Number of Visits 13    Date for PT Re-Evaluation 01/18/21    PT Start Time 1100    PT Stop Time 1147    PT Time Calculation (min) 47 min    Activity Tolerance Patient tolerated treatment well    Behavior During Therapy [facility] for tasks assessed/performed             Past Medical History:  Diagnosis Date   Arthritis    knees   Cancer ([place])    Carpal tunnel syndrome    CHF (congestive heart failure) ([place])    Diabetes mellitus    type 2   GERD (gastroesophageal reflux disease)    Heart murmur    "Little, No concerns" per Dr  Dannielle Burn pt reported.   Hypertension    controlled using a guided approch with plasma renin activity   Multiple myeloma not having achieved remission ([place]) 01/06/2017   Neuropathy    Peripheral vascular disease ([place])    Sleep apnea    01/03/2020: per patient, hasn't used CPAP machine in two years due to inability to breathe well with it on    Past Surgical History:  Procedure Laterality Date   BIOPSY  11/28/2016   Procedure: BIOPSY;  Surgeon: Rogene Houston, MD;  [facility];  Service: Endoscopy;;  gastric   BIOPSY  07/02/2019   Procedure: BIOPSY;  Surgeon: Rogene Houston, MD;  [facility];  Service: Endoscopy;;   BTL     1982   CERVICAL CONE BIOPSY     cervical lesion   COLONOSCOPY WITH PROPOFOL N/A 07/02/2019   Procedure: COLONOSCOPY WITH PROPOFOL;  Surgeon: Rogene Houston, MD;  [facility];  Service: Endoscopy;  Laterality: N/A;  12:40 - pt can't come earlier due to transportation   ESOPHAGOGASTRODUODENOSCOPY (EGD) WITH PROPOFOL  N/A 11/28/2016   Procedure: ESOPHAGOGASTRODUODENOSCOPY (EGD) WITH PROPOFOL;  Surgeon: Rogene Houston, MD;  [facility];  Service: Endoscopy;  Laterality: N/A;  9:25   IR FLUORO GUIDE PORT INSERTION RIGHT  01/29/2017   IR US GUIDE VASC ACCESS RIGHT  01/29/2017   lipoma removal     right shoulder 2001   LUMBAR FUSION  01/05/2020    Lumbar Two-Three Lumbar Three-Four Lumbar Four-Five Posterior lumbar interbody fusion with decompression (N/A Spine Lumbar)   MULTIPLE EXTRACTIONS WITH ALVEOLOPLASTY Bilateral 08/24/2012   Procedure: MULTIPLE EXTRACTION WITH ALVEOLOPLASTY BIOPSY OF RIGHT AND LEFT MANDIBLE ;  Surgeon: Gae Bon, DDS;  Location: [place];  Service: Oral Surgery;  Laterality: Bilateral;   POLYPECTOMY  07/02/2019   Procedure: POLYPECTOMY;  Surgeon: Rogene Houston, MD;  [facility];  Service: Endoscopy;;   POSTERIOR LUMBAR FUSION 4 LEVEL N/A 12/26/2016   Procedure: THORACIC EIGHT TUMOR RESECTION, THORACIC SIX- THORACIC TEN POSTERIOR SPINAL FUSION;  Surgeon: Ashok Pall, MD;  Location: [place];  Service: Neurosurgery;  Laterality: N/A;  THORACIC 8 TUMOR RESECTION, THORACIC 6- THORACIC 10 POSTERIOR SPINAL FUSION   ROTATOR CUFF REPAIR     right shoulder   TUBAL LIGATION      There were no  vitals filed for this visit.   Subjective Assessment - 01/02/21 1058     Pertinent History Multiple myeloma in remission - completed chemotherapy SCT on 07/01/17 currently on lenalidomide / revlimid, other history includes: CHF, lumbar fusion L2-5 x2, CIPN                [facility] PT Assessment - 01/02/21 0001       6 Minute walk- Post Test   Modified Borg Scale for Dyspnea 2- Mild shortness of breath    Perceived Rate of Exertion (Borg) 13- Somewhat hard      6 minute walk test results    Aerobic Endurance Distance Walked 380    Endurance additional comments using [facility]      Standardized Balance Assessment   Standardized Balance Assessment Five Times Sit to Stand     Five times sit to stand comments  34 seconds                           [facility] Adult PT Treatment/Exercise - 01/02/21 0001       High Level Balance   High Level Balance Comments at parallel bars: standing working on upright posture with mirror cueing, standing upright with alternating lift of hands off of parallel bars on blue foam, standing on airex 3x10" without hand hold CGA, Eyes closed 10" x 3 CGA      Exercises   Other Exercises  performed OTAGO  2# weights used for LEs each x 5 each ; omitted trunk extension and cervical exerciese today.  Performed balance exercises in parallel bars without gait belt due to increased stability      Shoulder Exercises: Standing   Extension Strengthening;Both;12 reps    Theraband Level (Shoulder Extension) Level 2 (Red)    Retraction Strengthening;Both;15 reps    Theraband Level (Shoulder Retraction) Level 2 (Red)                         PT Long Term Goals - 12/06/20 1158       PT LONG TERM GOAL #1   Title Pt will improve TUG to less than 20 seconds    Status On-going      PT LONG TERM GOAL #2   Title Pt will improve 5 times sit to stand to at least less than 30 seconds    Status On-going      PT LONG TERM GOAL #3   Title Pt will perform full 50mn walk test without needing to stop    Status On-going      PT LONG TERM GOAL #4   Title Pt will be ind with final HEP    Status On-going      PT LONG TERM GOAL #5   Title Pt will improve BERG to at least 40/56    Baseline 35    Status New                   Plan - 01/02/21 1151     Clinical Impression Statement Pt has improved 648m walk time to more than twice the original value and has improved 5x sit to stand by almost 8 seconds. Pt continues to do well with PT    PT Frequency 2x / week    PT Duration 6 weeks    PT Treatment/Interventions ADLs/Self Care Home Management;Therapeutic exercise;Patient/family education;Gait training;Neuromuscular  re-education;Balance training;DME Instruction    PT Next Visit Plan  supine low back stretches and strength (pt fell after getting home from fusion and it has hurt ever since with all hardware stable per xray), gait in clinic, activities in parallel bars for balance and general seated strength    Consulted and Agree with Plan of Care Patient             Patient will benefit from skilled therapeutic intervention in order to improve the following deficits and impairments:     Visit Diagnosis: Multiple myeloma in remission ([place])  Decreased functional mobility and endurance  Other abnormalities of gait and mobility     Problem List Patient Active Problem List   Diagnosis Date Noted   Spondylolisthesis of lumbar region 01/05/2020   Generalized abdominal pain 06/03/2019   Port-A-Cath in place 08/21/2018   H/O autologous stem cell transplant ([place]) 07/01/2017   Autologous donor of stem cells 05/20/2017   Phlebitis and thrombophlebitis of superficial vessels of right lower extremity 01/24/2017   Multiple myeloma in remission ([place]) 01/06/2017   Neuropathy 01/06/2017   Hypercholesteremia 11/04/2016   Class 1 obesity due to excess calories with serious comorbidity and body mass index (BMI) of 31.0 to 31.9 in adult 11/04/2016   Gastroesophageal reflux disease without esophagitis 10/30/2016   Abdominal pain, chronic, epigastric 10/30/2016   OBSTRUCTIVE SLEEP APNEA 08/15/2009   BEN HTN HEART DISEASE WITHOUT HEART FAIL 08/15/2009   Uncontrolled type 2 diabetes mellitus with complication, without long-term current use of insulin ([place]) 07/27/2009   TOBACCO ABUSE 07/27/2009   Essential hypertension, benign 07/27/2009    Christine Garrett 01/02/2021, 11:57 AM  [place], [place], [address] Phone: [phone]   Fax:  [phone]  Name: Christine Garrett MRN: [id number] Date of Birth: 14-Apr-1956

## 2021-01-09 ENCOUNTER — Ambulatory Visit: Payer: Medicare HMO | Admitting: Rehabilitation

## 2021-01-09 ENCOUNTER — Other Ambulatory Visit: Payer: Self-pay

## 2021-01-09 DIAGNOSIS — C9001 Multiple myeloma in remission: Secondary | ICD-10-CM

## 2021-01-09 DIAGNOSIS — R2689 Other abnormalities of gait and mobility: Secondary | ICD-10-CM | POA: Diagnosis not present

## 2021-01-09 DIAGNOSIS — Z7409 Other reduced mobility: Secondary | ICD-10-CM

## 2021-01-09 NOTE — Therapy (Signed)
[place], [place], [address] Phone: [phone]   Fax:  [phone]  Physical Therapy Treatment  Patient Details  Name: Christine Garrett MRN: [id number] Date of Birth: January 20, 1956 Referring Provider (PT): Dr. Irene Limbo   Encounter Date: 01/09/2021   PT End of Session - 01/09/21 1438     Visit Number 10    Number of Visits 13    PT Start Time 1400    PT Stop Time 1432    PT Time Calculation (min) 32 min    Activity Tolerance Patient tolerated treatment well    Behavior During Therapy [facility] for tasks assessed/performed             Past Medical History:  Diagnosis Date   Arthritis    knees   Cancer ([place])    Carpal tunnel syndrome    CHF (congestive heart failure) ([place])    Diabetes mellitus    type 2   GERD (gastroesophageal reflux disease)    Heart murmur    "Little, No concerns" per Dr  Dannielle Burn pt reported.   Hypertension    controlled using a guided approch with plasma renin activity   Multiple myeloma not having achieved remission ([place]) 01/06/2017   Neuropathy    Peripheral vascular disease ([place])    Sleep apnea    01/03/2020: per patient, hasn't used CPAP machine in two years due to inability to breathe well with it on    Past Surgical History:  Procedure Laterality Date   BIOPSY  11/28/2016   Procedure: BIOPSY;  Surgeon: Rogene Houston, MD;  [facility];  Service: Endoscopy;;  gastric   BIOPSY  07/02/2019   Procedure: BIOPSY;  Surgeon: Rogene Houston, MD;  [facility];  Service: Endoscopy;;   BTL     1982   CERVICAL CONE BIOPSY     cervical lesion   COLONOSCOPY WITH PROPOFOL N/A 07/02/2019   Procedure: COLONOSCOPY WITH PROPOFOL;  Surgeon: Rogene Houston, MD;  [facility];  Service: Endoscopy;  Laterality: N/A;  12:40 - pt can't come earlier due to transportation   ESOPHAGOGASTRODUODENOSCOPY (EGD) WITH PROPOFOL N/A 11/28/2016   Procedure:  ESOPHAGOGASTRODUODENOSCOPY (EGD) WITH PROPOFOL;  Surgeon: Rogene Houston, MD;  [facility];  Service: Endoscopy;  Laterality: N/A;  9:25   IR FLUORO GUIDE PORT INSERTION RIGHT  01/29/2017   IR US GUIDE VASC ACCESS RIGHT  01/29/2017   lipoma removal     right shoulder 2001   LUMBAR FUSION  01/05/2020    Lumbar Two-Three Lumbar Three-Four Lumbar Four-Five Posterior lumbar interbody fusion with decompression (N/A Spine Lumbar)   MULTIPLE EXTRACTIONS WITH ALVEOLOPLASTY Bilateral 08/24/2012   Procedure: MULTIPLE EXTRACTION WITH ALVEOLOPLASTY BIOPSY OF RIGHT AND LEFT MANDIBLE ;  Surgeon: Gae Bon, DDS;  Location: [place];  Service: Oral Surgery;  Laterality: Bilateral;   POLYPECTOMY  07/02/2019   Procedure: POLYPECTOMY;  Surgeon: Rogene Houston, MD;  [facility];  Service: Endoscopy;;   POSTERIOR LUMBAR FUSION 4 LEVEL N/A 12/26/2016   Procedure: THORACIC EIGHT TUMOR RESECTION, THORACIC SIX- THORACIC TEN POSTERIOR SPINAL FUSION;  Surgeon: Ashok Pall, MD;  Location: [place];  Service: Neurosurgery;  Laterality: N/A;  THORACIC 8 TUMOR RESECTION, THORACIC 6- THORACIC 10 POSTERIOR SPINAL FUSION   ROTATOR CUFF REPAIR     right shoulder   TUBAL LIGATION      There were no vitals filed for this visit.   Subjective  Assessment - 01/09/21 1400     Subjective Nothing new    Pertinent History Multiple myeloma in remission - completed chemotherapy SCT on 07/01/17 currently on lenalidomide / revlimid, other history includes: CHF, lumbar fusion L2-5 x2, CIPN    Currently in Pain? Yes    Pain Score 4     Pain Location Back    Pain Descriptors / Indicators Aching;Dull                               [facility] Adult PT Treatment/Exercise - 01/09/21 0001       Ambulation/Gait   Gait Comments ambulated a total of 749ft over 17min with rest break x 1      Neuro Re-ed    Neuro Re-ed Details  In parallel bars: on blue foam: no hold feet together and tandem stance 3xbil  15" , EC 3x10", single leg stance 3x max each with CGA as needed.                         PT Long Term Goals - 01/09/21 1439       PT LONG TERM GOAL #1   Title Pt will improve TUG to less than 20 seconds    Status Achieved      PT LONG TERM GOAL #2   Title Pt will improve 5 times sit to stand to at least less than 30 seconds    Status Achieved      PT LONG TERM GOAL #3   Title Pt will perform full 104min walk test without needing to stop    Status Partially Met      PT LONG TERM GOAL #4   Title Pt will be ind with final HEP    Status Achieved      PT LONG TERM GOAL #5   Title Pt will improve BERG to at least 40/56    Status Achieved                   Plan - 01/09/21 1438     Clinical Impression Statement Pt is ready for DC today with all goals met and performing all TE and balance work with great stability and no fear of falling in clinic.  Pt has been taking dog on more walks and overall feels more stability    PT Frequency 2x / week    PT Duration 6 weeks    PT Treatment/Interventions ADLs/Self Care Home Management;Therapeutic exercise;Patient/family education;Gait training;Neuromuscular re-education;Balance training;DME Instruction             Patient will benefit from skilled therapeutic intervention in order to improve the following deficits and impairments:     Visit Diagnosis: Multiple myeloma in remission ([place])  Decreased functional mobility and endurance  Other abnormalities of gait and mobility     Problem List Patient Active Problem List   Diagnosis Date Noted   Spondylolisthesis of lumbar region 01/05/2020   Generalized abdominal pain 06/03/2019   Port-A-Cath in place 08/21/2018   H/O autologous stem cell transplant ([place]) 07/01/2017   Autologous donor of stem cells 05/20/2017   Phlebitis and thrombophlebitis of superficial vessels of right lower extremity 01/24/2017   Multiple myeloma in remission ([place]) 01/06/2017    Neuropathy 01/06/2017   Hypercholesteremia 11/04/2016   Class 1 obesity due to excess calories with serious comorbidity and body mass index (BMI) of 31.0 to 31.9 in adult  11/04/2016   Gastroesophageal reflux disease without esophagitis 10/30/2016   Abdominal pain, chronic, epigastric 10/30/2016   OBSTRUCTIVE SLEEP APNEA 08/15/2009   BEN HTN HEART DISEASE WITHOUT HEART FAIL 08/15/2009   Uncontrolled type 2 diabetes mellitus with complication, without long-term current use of insulin ([place]) 07/27/2009   TOBACCO ABUSE 07/27/2009   Essential hypertension, benign 07/27/2009    Stark Bray 01/09/2021, 2:40 PM  [place], [place], [address] Phone: [phone]   Fax:  [phone]  Name: Bryonna Sundby MRN: [id number] Date of Birth: [birth year]  PHYSICAL THERAPY DISCHARGE SUMMARY  Visits from Start of Care: 10  Current functional level related to goals / functional outcomes: See above   Remaining deficits:chronic neuropathy, weakness and decreased endurance   Education / Equipment: HEP Plan: Patient agrees to discharge.  Patient goals were partially met

## 2021-01-22 DIAGNOSIS — I5032 Chronic diastolic (congestive) heart failure: Secondary | ICD-10-CM | POA: Diagnosis not present

## 2021-01-22 DIAGNOSIS — I2510 Atherosclerotic heart disease of native coronary artery without angina pectoris: Secondary | ICD-10-CM | POA: Diagnosis not present

## 2021-01-22 DIAGNOSIS — E7800 Pure hypercholesterolemia, unspecified: Secondary | ICD-10-CM | POA: Diagnosis not present

## 2021-01-22 DIAGNOSIS — I10 Essential (primary) hypertension: Secondary | ICD-10-CM | POA: Diagnosis not present

## 2021-02-01 DIAGNOSIS — I2510 Atherosclerotic heart disease of native coronary artery without angina pectoris: Secondary | ICD-10-CM | POA: Diagnosis not present

## 2021-02-01 DIAGNOSIS — E1142 Type 2 diabetes mellitus with diabetic polyneuropathy: Secondary | ICD-10-CM | POA: Diagnosis not present

## 2021-02-01 DIAGNOSIS — Z794 Long term (current) use of insulin: Secondary | ICD-10-CM | POA: Diagnosis not present

## 2021-02-01 DIAGNOSIS — E785 Hyperlipidemia, unspecified: Secondary | ICD-10-CM | POA: Diagnosis not present

## 2021-02-01 DIAGNOSIS — K219 Gastro-esophageal reflux disease without esophagitis: Secondary | ICD-10-CM | POA: Diagnosis not present

## 2021-02-01 DIAGNOSIS — G8929 Other chronic pain: Secondary | ICD-10-CM | POA: Diagnosis not present

## 2021-02-01 DIAGNOSIS — I5032 Chronic diastolic (congestive) heart failure: Secondary | ICD-10-CM | POA: Diagnosis not present

## 2021-02-01 DIAGNOSIS — I080 Rheumatic disorders of both mitral and aortic valves: Secondary | ICD-10-CM | POA: Diagnosis not present

## 2021-02-01 DIAGNOSIS — I10 Essential (primary) hypertension: Secondary | ICD-10-CM | POA: Diagnosis not present

## 2021-02-01 DIAGNOSIS — R32 Unspecified urinary incontinence: Secondary | ICD-10-CM | POA: Diagnosis not present

## 2021-02-01 DIAGNOSIS — C9001 Multiple myeloma in remission: Secondary | ICD-10-CM | POA: Diagnosis not present

## 2021-02-08 ENCOUNTER — Inpatient Hospital Stay: Payer: Medicare HMO | Attending: Hematology

## 2021-02-08 ENCOUNTER — Other Ambulatory Visit: Payer: Medicare HMO

## 2021-02-08 ENCOUNTER — Other Ambulatory Visit: Payer: Self-pay

## 2021-02-08 ENCOUNTER — Inpatient Hospital Stay (HOSPITAL_BASED_OUTPATIENT_CLINIC_OR_DEPARTMENT_OTHER): Payer: Medicare HMO | Admitting: Hematology

## 2021-02-08 ENCOUNTER — Inpatient Hospital Stay: Payer: Medicare HMO

## 2021-02-08 VITALS — BP 195/99 | HR 81 | Temp 98.7°F | Resp 20 | Wt 229.5 lb

## 2021-02-08 DIAGNOSIS — Z87891 Personal history of nicotine dependence: Secondary | ICD-10-CM | POA: Insufficient documentation

## 2021-02-08 DIAGNOSIS — Z818 Family history of other mental and behavioral disorders: Secondary | ICD-10-CM | POA: Diagnosis not present

## 2021-02-08 DIAGNOSIS — C9001 Multiple myeloma in remission: Secondary | ICD-10-CM

## 2021-02-08 DIAGNOSIS — Z95828 Presence of other vascular implants and grafts: Secondary | ICD-10-CM

## 2021-02-08 DIAGNOSIS — R69 Illness, unspecified: Secondary | ICD-10-CM | POA: Diagnosis not present

## 2021-02-08 DIAGNOSIS — R519 Headache, unspecified: Secondary | ICD-10-CM | POA: Diagnosis not present

## 2021-02-08 DIAGNOSIS — R1011 Right upper quadrant pain: Secondary | ICD-10-CM

## 2021-02-08 DIAGNOSIS — Z79899 Other long term (current) drug therapy: Secondary | ICD-10-CM | POA: Diagnosis not present

## 2021-02-08 DIAGNOSIS — E119 Type 2 diabetes mellitus without complications: Secondary | ICD-10-CM | POA: Insufficient documentation

## 2021-02-08 DIAGNOSIS — R109 Unspecified abdominal pain: Secondary | ICD-10-CM | POA: Diagnosis not present

## 2021-02-08 DIAGNOSIS — Z833 Family history of diabetes mellitus: Secondary | ICD-10-CM | POA: Diagnosis not present

## 2021-02-08 DIAGNOSIS — R222 Localized swelling, mass and lump, trunk: Secondary | ICD-10-CM

## 2021-02-08 DIAGNOSIS — Z8249 Family history of ischemic heart disease and other diseases of the circulatory system: Secondary | ICD-10-CM | POA: Insufficient documentation

## 2021-02-08 DIAGNOSIS — G629 Polyneuropathy, unspecified: Secondary | ICD-10-CM | POA: Diagnosis not present

## 2021-02-08 LAB — CBC WITH DIFFERENTIAL/PLATELET
Abs Immature Granulocytes: 0.01 10*3/uL (ref 0.00–0.07)
Basophils Absolute: 0.1 10*3/uL (ref 0.0–0.1)
Basophils Relative: 1 %
Eosinophils Absolute: 0.2 10*3/uL (ref 0.0–0.5)
Eosinophils Relative: 2 %
HCT: 38.1 % (ref 36.0–46.0)
Hemoglobin: 12.5 g/dL (ref 12.0–15.0)
Immature Granulocytes: 0 %
Lymphocytes Relative: 40 %
Lymphs Abs: 2.6 10*3/uL (ref 0.7–4.0)
MCH: 32.6 pg (ref 26.0–34.0)
MCHC: 32.8 g/dL (ref 30.0–36.0)
MCV: 99.5 fL (ref 80.0–100.0)
Monocytes Absolute: 0.5 10*3/uL (ref 0.1–1.0)
Monocytes Relative: 7 %
Neutro Abs: 3.3 10*3/uL (ref 1.7–7.7)
Neutrophils Relative %: 50 %
Platelets: 235 10*3/uL (ref 150–400)
RBC: 3.83 MIL/uL — ABNORMAL LOW (ref 3.87–5.11)
RDW: 13.8 % (ref 11.5–15.5)
WBC: 6.6 10*3/uL (ref 4.0–10.5)
nRBC: 0 % (ref 0.0–0.2)

## 2021-02-08 LAB — CMP (CANCER CENTER ONLY)
ALT: 19 U/L (ref 0–44)
AST: 23 U/L (ref 15–41)
Albumin: 3.8 g/dL (ref 3.5–5.0)
Alkaline Phosphatase: 65 U/L (ref 38–126)
Anion gap: 8 (ref 5–15)
BUN: 11 mg/dL (ref 8–23)
CO2: 24 mmol/L (ref 22–32)
Calcium: 9.9 mg/dL (ref 8.9–10.3)
Chloride: 110 mmol/L (ref 98–111)
Creatinine: 1.15 mg/dL — ABNORMAL HIGH (ref 0.44–1.00)
GFR, Estimated: 54 mL/min — ABNORMAL LOW (ref >60)
Glucose, Bld: 112 mg/dL — ABNORMAL HIGH (ref 70–99)
Potassium: 4.3 mmol/L (ref 3.5–5.1)
Sodium: 142 mmol/L (ref 135–145)
Total Bilirubin: 0.5 mg/dL (ref 0.3–1.2)
Total Protein: 7.3 g/dL (ref 6.5–8.1)

## 2021-02-08 MED ORDER — HEPARIN SOD (PORK) LOCK FLUSH 100 UNIT/ML IV SOLN
500.0000 [IU] | Freq: Once | INTRAVENOUS | Status: AC
  Administered 2021-02-08: 500 [IU]

## 2021-02-08 MED ORDER — SODIUM CHLORIDE 0.9 % IV SOLN: INTRAVENOUS | Status: DC

## 2021-02-08 MED ORDER — SODIUM CHLORIDE 0.9% FLUSH
10.0000 mL | Freq: Once | INTRAVENOUS | Status: AC
  Administered 2021-02-08: 10 mL

## 2021-02-08 MED ORDER — ZOLEDRONIC ACID 4 MG/100ML IV SOLN
4.0000 mg | Freq: Once | INTRAVENOUS | Status: AC
  Administered 2021-02-08: 4 mg via INTRAVENOUS
  Filled 2021-02-08: qty 100

## 2021-02-08 MED ORDER — SODIUM CHLORIDE 0.9% FLUSH
10.0000 mL | Freq: Once | INTRAVENOUS | Status: AC
  Administered 2021-02-08: 10 mL via INTRAVENOUS

## 2021-02-08 NOTE — Patient Instructions (Signed)
[facility]  Discharge Instructions: Thank you for choosing [facility] to provide your oncology and hematology care.   If you have a lab appointment with the Cancer Center, please go directly to the Cancer Center and check in at the registration area.   Wear comfortable clothing and clothing appropriate for easy access to any Portacath or PICC line.   We strive to give you quality time with your provider. You may need to reschedule your appointment if you arrive late (15 or more minutes).  Arriving late affects you and other patients whose appointments are after yours.  Also, if you miss three or more appointments without notifying the office, you may be dismissed from the clinic at the provider's discretion.      For prescription refill requests, have your pharmacy contact our office and allow 72 hours for refills to be completed.    Today you received the following chemotherapy and/or immunotherapy agents: Zometa      To help prevent nausea and vomiting after your treatment, we encourage you to take your nausea medication as directed.  BELOW ARE SYMPTOMS THAT SHOULD BE REPORTED IMMEDIATELY: *FEVER GREATER THAN 100.4 F (38 C) OR HIGHER *CHILLS OR SWEATING *NAUSEA AND VOMITING THAT IS NOT CONTROLLED WITH YOUR NAUSEA MEDICATION *UNUSUAL SHORTNESS OF BREATH *UNUSUAL BRUISING OR BLEEDING *URINARY PROBLEMS (pain or burning when urinating, or frequent urination) *BOWEL PROBLEMS (unusual diarrhea, constipation, pain near the anus) TENDERNESS IN MOUTH AND THROAT WITH OR WITHOUT PRESENCE OF ULCERS (sore throat, sores in mouth, or a toothache) UNUSUAL RASH, SWELLING OR PAIN  UNUSUAL VAGINAL DISCHARGE OR ITCHING   Items with * indicate a potential emergency and should be followed up as soon as possible or go to the Emergency Department if any problems should occur.  Please show the CHEMOTHERAPY ALERT CARD or IMMUNOTHERAPY ALERT CARD at check-in to the  Emergency Department and triage nurse.  Should you have questions after your visit or need to cancel or reschedule your appointment, please contact [facility]  Dept: [phone]  and follow the prompts.  Office hours are 8:00 a.m. to 4:30 p.m. Monday - Friday. Please note that voicemails left after 4:00 p.m. may not be returned until the following business day.  We are closed weekends and major holidays. You have access to a nurse at all times for urgent questions. Please call the main number to the clinic Dept: [phone] and follow the prompts.   For any non-urgent questions, you may also contact your provider using MyChart. We now offer e-Visits for anyone 18 and older to request care online for non-urgent symptoms. For details visit [email or web address].   Also download the MyChart app! Go to the app store, search "MyChart", open the app, select [facility], and log in with your MyChart username and password.  Due to Covid, a mask is required upon entering the hospital/clinic. If you do not have a mask, one will be given to you upon arrival. For doctor visits, patients may have 1 support person aged 18 or older with them. For treatment visits, patients cannot have anyone with them due to current Covid guidelines and our immunocompromised population.  

## 2021-02-08 NOTE — Addendum Note (Signed)
Addended by: Sherron Flemings on: 02/08/2021 11:47 AM   Modules accepted: Orders

## 2021-02-12 ENCOUNTER — Telehealth: Payer: Self-pay | Admitting: Hematology

## 2021-02-12 NOTE — Telephone Encounter (Signed)
Scheduled follow-up appointments per 8/25 los. Patient is aware. 

## 2021-02-13 LAB — MULTIPLE MYELOMA PANEL, SERUM
Albumin SerPl Elph-Mcnc: 3.8 g/dL (ref 2.9–4.4)
Albumin/Glob SerPl: 1.2 (ref 0.7–1.7)
Alpha 1: 0.2 g/dL (ref 0.0–0.4)
Alpha2 Glob SerPl Elph-Mcnc: 0.8 g/dL (ref 0.4–1.0)
B-Globulin SerPl Elph-Mcnc: 1.2 g/dL (ref 0.7–1.3)
Gamma Glob SerPl Elph-Mcnc: 1 g/dL (ref 0.4–1.8)
Globulin, Total: 3.2 g/dL (ref 2.2–3.9)
IgA: 238 mg/dL (ref 87–352)
IgG (Immunoglobin G), Serum: 1129 mg/dL (ref 586–1602)
IgM (Immunoglobulin M), Srm: 37 mg/dL (ref 26–217)
Total Protein ELP: 7 g/dL (ref 6.0–8.5)

## 2021-02-15 ENCOUNTER — Encounter: Payer: Self-pay | Admitting: Hematology

## 2021-02-15 NOTE — Progress Notes (Signed)
HEMATOLOGY/ONCOLOGY CLINIC NOTE  Date of Service: .02/08/2021   Patient Care Team: Neale Burly, MD as PCP - General (Internal Medicine) Alda Berthold, DO as Consulting Physician (Neurology)  CHIEF COMPLAINTS/PURPOSE OF CONSULTATION:  F/u for continued mx of Multiple myeloma  Oncologic History:  64 y.o. female who initially presented with significant weight loss in the context of H. pylori-associated gastritis and alcohol abuse. At the same time, an incidental discovery of a lower thoracic vertebral mass in the context of chronic numbness and weakness in the lower extremities. MRI of the thoracic spine demonstrated a destructive lesion at T8 level and patient underwent surgical treatment. 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 Staging evaluation conducted by our clinic confirmed presence of IgG kappa multiple myeloma. Cytogenetics studies demonstrated normal karyotype, FISH studies are positive for 13q34 & D13S319 loss and no loss of p53. Based on initial information, patient was staged at [facility].    Patient has completed 6 cycles of induction systemic chemotherapy with lenalidomide, bortezomib, and low-dose dexamethasone.  Repeat bone marrow biopsy demonstrated excellent response to treatment and patient subsequently underwent consolidation therapy including melphalan conditioning and autologous stem cell rescue.  Repeat assessment with PET/CT and bone marrow biopsy on 10/13/2017 demonstrates stringent complete response maintenance.  HISTORY OF PRESENTING ILLNESS:   Christine Garrett is a wonderful 65 y.o. female who has been referred to [place] by my colleague Dr Grace Isaac for evaluation and management of Multiple myeloma. The pt reports that she is doing well overall.   The pt has previously been followed by my colleague Dr Grace Isaac and completed 6 cycles of induction with Revlimid, Velcade and dexamethasone. She had a autologous PBSCT on  07/01/17. She is on lenalidomide maintenance currently.   The pt reports that she has had a headache for the past week, noting a constant pounding in the front of her forehead. She denies fevers and is always cold. She is having clear, nasal discharge and seasonal allergies. She notes aspirin has alleviated her headache some.   She is continuing to take Revlimid on 21 day cycles.  Most recent lab results (11/21/17) of CBC and CMP is as follows: all values are WNL.  MMP 11/21/17 is shows no M spike   On review of systems, pt reports headache, moving her bowels well, increasing strength, and denies mouth sores, fevers, night sweats, and any other symptoms.   INTERVAL HISTORY:   Christine Garrett returns today for management and evaluation of her Multiple Myeloma currently in remission. The patient's last visit with [place] was on 10/19/2020. The pt reports that she is doing well overall.  The pt reports no new concerns or symptoms since the last visit.  Still has significant neuropathy.  Has been working with her primary care physician to try to optimize her diabetes management.. Also follows with neurology Dr. Narda Amber.  Patient notes no new fevers chills night sweats or focal bone pains.  Lab results today 02/08/2021 of CBC w/diff -within normal limits  CMP stable and unremarkable. MMP -- no M spike.  On review of systems, pt reports no other acute new symptoms.  MEDICAL HISTORY:  Past Medical History:  Diagnosis Date   Arthritis    knees   Cancer ([place])    Carpal tunnel syndrome    CHF (congestive heart failure) (H[facility])    Diabetes mellitus    type 2   GERD (gastroesophageal reflux disease)    Heart murmur    "  Little, No concerns" per Dr  Dannielle Burn pt reported.   Hypertension    controlled using a guided approch with plasma renin activity   Multiple myeloma not having achieved remission ([place]) 01/06/2017   Neuropathy    Peripheral vascular disease ([place])    Sleep apnea    01/03/2020: per  patient, hasn't used CPAP machine in two years due to inability to breathe well with it on    SURGICAL HISTORY: Past Surgical History:  Procedure Laterality Date   BIOPSY  11/28/2016   Procedure: BIOPSY;  Surgeon: Rogene Houston, MD;  [facility];  Service: Endoscopy;;  gastric   BIOPSY  07/02/2019   Procedure: BIOPSY;  Surgeon: Rogene Houston, MD;  [facility];  Service: Endoscopy;;   BTL     1982   CERVICAL CONE BIOPSY     cervical lesion   COLONOSCOPY WITH PROPOFOL N/A 07/02/2019   Procedure: COLONOSCOPY WITH PROPOFOL;  Surgeon: Rogene Houston, MD;  [facility];  Service: Endoscopy;  Laterality: N/A;  12:40 - pt can't come earlier due to transportation   ESOPHAGOGASTRODUODENOSCOPY (EGD) WITH PROPOFOL N/A 11/28/2016   Procedure: ESOPHAGOGASTRODUODENOSCOPY (EGD) WITH PROPOFOL;  Surgeon: Rogene Houston, MD;  [facility];  Service: Endoscopy;  Laterality: N/A;  9:25   IR FLUORO GUIDE PORT INSERTION RIGHT  01/29/2017   IR US GUIDE VASC ACCESS RIGHT  01/29/2017   lipoma removal     right shoulder 2001   LUMBAR FUSION  01/05/2020    Lumbar Two-Three Lumbar Three-Four Lumbar Four-Five Posterior lumbar interbody fusion with decompression (N/A Spine Lumbar)   MULTIPLE EXTRACTIONS WITH ALVEOLOPLASTY Bilateral 08/24/2012   Procedure: MULTIPLE EXTRACTION WITH ALVEOLOPLASTY BIOPSY OF RIGHT AND LEFT MANDIBLE ;  Surgeon: Gae Bon, DDS;  Location: [facility];  Service: Oral Surgery;  Laterality: Bilateral;   POLYPECTOMY  07/02/2019   Procedure: POLYPECTOMY;  Surgeon: Rogene Houston, MD;  [facility];  Service: Endoscopy;;   POSTERIOR LUMBAR FUSION 4 LEVEL N/A 12/26/2016   Procedure: THORACIC EIGHT TUMOR RESECTION, THORACIC SIX- THORACIC TEN POSTERIOR SPINAL FUSION;  Surgeon: Ashok Pall, MD;  Location: [facility];  Service: Neurosurgery;  Laterality: N/A;  THORACIC 8 TUMOR RESECTION, THORACIC 6- THORACIC 10 POSTERIOR SPINAL FUSION   ROTATOR  CUFF REPAIR     right shoulder   TUBAL LIGATION      SOCIAL HISTORY: Social History   Socioeconomic History   Marital status: Single    Spouse name: Not on file   Number of children: 3   Years of education: Not on file   Highest education level: Not on file  Occupational History   Occupation: on disability   Occupation: former CNA  Tobacco Use   Smoking status: Former    Packs/day: 0.50    Years: 6.00    Pack years: 3.00    Types: Cigarettes    Quit date: 2019    Years since quitting: 3.6   Smokeless tobacco: Never  Vaping Use   Vaping Use: Never used  Substance and Sexual Activity   Alcohol use: No    Alcohol/week: 6.0 standard drinks    Types: 6 Cans of beer per week   Drug use: No   Sexual activity: Not Currently    Birth control/protection: Post-menopausal  Other Topics Concern   Not on file  Social History Narrative   Lives with son, daughter and grandkids in a 2 story home but stays on the first  floor.  Has 3 children.  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      Right handed   Social Determinants of Health   Financial Resource Strain: Not on file  Food Insecurity: Not on file  Transportation Needs: Not on file  Physical Activity: Not on file  Stress: Not on file  Social Connections: Not on file  Intimate Partner Violence: Not on file    FAMILY HISTORY: Family History  Problem Relation Age of Onset   Diabetes Mother    Hypertension Mother    Heart disease Mother    Alzheimer's disease Mother    Diabetes Father    Heart disease Sister    Diabetes Sister     ALLERGIES:  is allergic to hydrocodone.  MEDICATIONS:  Current Outpatient Medications  Medication Sig Dispense Refill   amLODipine (NORVASC) 5 MG tablet Take 5 mg by mouth daily.     apixaban (ELIQUIS) 5 MG TABS tablet Take 1 tablet (5 mg total) by mouth 2 (two) times daily.     atorvastatin (LIPITOR) 40 MG tablet Take 40 mg by mouth daily.     dicyclomine  (BENTYL) 10 MG capsule Take 1 capsule (10 mg total) by mouth 3 (three) times daily as needed for spasms. 60 capsule 1   enalapril (VASOTEC) 20 MG tablet Take 20 mg by mouth 2 (two) times daily.      gabapentin (NEURONTIN) 300 MG capsule TAKE 2 CAPSULES (600 MG TOTAL) BY MOUTH 3 (THREE) TIMES DAILY. 540 capsule 0   hydrALAZINE (APRESOLINE) 25 MG tablet Take 25 mg by mouth 2 (two) times daily.     insulin degludec (TRESIBA FLEXTOUCH) 100 UNIT/ML SOPN FlexTouch Pen Inject 0.5 mLs (50 Units total) into the skin daily at 10 pm. (Patient taking differently: Inject 40 Units into the skin daily at 10 pm.) 5 pen 2   Insulin Pen Needle (B-D ULTRAFINE III SHORT PEN) 31G X 8 MM MISC 1 each by Does not apply route as directed. 100 each 3   metFORMIN (GLUCOPHAGE) 1000 MG tablet Take 1,000 mg by mouth 2 (two) times daily.      metoprolol succinate (TOPROL-XL) 50 MG 24 hr tablet Take 50 mg by mouth daily. Take with or immediately following a meal.     Omega-3 Fatty Acids (FISH OIL) 1000 MG CPDR Take 1,000 mg by mouth daily. Omega 3 300 mg     potassium chloride (MICRO-K) 10 MEQ CR capsule Take 10 mEq by mouth daily.     Vitamin D, Ergocalciferol, (DRISDOL) 50000 units CAPS capsule Take 50,000 Units by mouth every Monday.      REVLIMID 5 MG capsule TAKE 1 CAPSULE (5MG) BY  MOUTH DAILY FOR 21 DAYS ON, THEN 7 DAYS OFF 21 capsule 0   No current facility-administered medications for this visit.    REVIEW OF SYSTEMS:   .10 Point review of Systems was done is negative except as noted above.   PHYSICAL EXAMINATION: ECOG PERFORMANCE STATUS: 1 - Symptomatic but completely ambulatory   Vitals:   02/08/21 1058  BP: (!) 195/99  Pulse: 81  Resp: 20  Temp: 98.7 F (37.1 C)  SpO2: 100%   Filed Weights   02/08/21 1058  Weight: 229 lb 8 oz (104.1 kg)   Body mass index is 33.89 kg/m.  Marland Kitchen GENERAL:alert, in no acute distress and comfortable SKIN: no acute rashes, no significant lesions EYES: conjunctiva are pink  and non-injected, sclera anicteric OROPHARYNX:  MMM, no exudates, no oropharyngeal erythema or ulceration NECK: supple, no JVD LYMPH:  no palpable lymphadenopathy in the cervical, axillary or inguinal regions LUNGS: clear to auscultation b/l with normal respiratory effort HEART: regular rate & rhythm ABDOMEN:  normoactive bowel sounds , non tender, not distended. Extremity: no pedal edema PSYCH: alert & oriented x 3 with fluent speech NEURO: no focal motor/sensory deficits  LABORATORY DATA:  I have reviewed the data as listed  . CBC Latest Ref Rng & Units 02/08/2021 10/19/2020 07/06/2020  WBC 4.0 - 10.5 K/uL 6.6 5.9 5.5  Hemoglobin 12.0 - 15.0 g/dL 12.5 12.1 12.0  Hematocrit 36.0 - 46.0 % 38.1 37.0 37.2  Platelets 150 - 400 K/uL 235 240 233    . CMP Latest Ref Rng & Units 02/08/2021 10/19/2020 07/06/2020  Glucose 70 - 99 mg/dL 112(H) 81 102(H)  BUN 8 - 23 mg/dL '11 9 13  ' Creatinine 0.44 - 1.00 mg/dL 1.15(H) 1.03(H) 1.03(H)  Sodium 135 - 145 mmol/L 142 145 143  Potassium 3.5 - 5.1 mmol/L 4.3 4.0 4.2  Chloride 98 - 111 mmol/L 110 110 112(H)  CO2 22 - 32 mmol/L 24 25 21(L)  Calcium 8.9 - 10.3 mg/dL 9.9 9.9 10.1  Total Protein 6.5 - 8.1 g/dL 7.3 7.7 7.9  Total Bilirubin 0.3 - 1.2 mg/dL 0.5 0.5 0.4  Alkaline Phos 38 - 126 U/L 65 72 89  AST 15 - 41 U/L '23 24 25  ' ALT 0 - 44 U/L '19 19 24   ' Component     Latest Ref Rng & Units 02/19/2019  IgG (Immunoglobin G), Serum     586 - 1,602 mg/dL 1,246  IgA     87 - 352 mg/dL 267  IgM (Immunoglobulin M), Srm     26 - 217 mg/dL 45  Total Protein ELP     6.0 - 8.5 g/dL 6.9  Albumin SerPl Elph-Mcnc     2.9 - 4.4 g/dL 3.6  Alpha 1     0.0 - 0.4 g/dL 0.2  Alpha2 Glob SerPl Elph-Mcnc     0.4 - 1.0 g/dL 0.8  B-Globulin SerPl Elph-Mcnc     0.7 - 1.3 g/dL 1.1  Gamma Glob SerPl Elph-Mcnc     0.4 - 1.8 g/dL 1.3  M Protein SerPl Elph-Mcnc     Not Observed g/dL Not Observed  Globulin, Total     2.2 - 3.9 g/dL 3.3  Albumin/Glob SerPl     0.7 - 1.7  1.1  IFE 1      Comment  Please Note (HCV):      Comment   03/25/18 SPEP and SFLC:     04/21/17 Cytogenetics:    01/17/17 Cytogenetics:    RADIOGRAPHIC STUDIES: I have personally reviewed the radiological images as listed and agreed with the findings in the report. No results found.  10/13/17 PET/CT     ASSESSMENT & PLAN:   65 y.o. female with  1. Multiple myeloma Currently in remission Autologous PBSCT on 07/01/17. IgG kappa multiple myeloma. Cytogenetics studies demonstrated normal karyotype, FISH studies are positive for 13q34 & D13S319 loss and no loss of p53. Based on initial information, patient was staged at [facility].  Completed 6 cycles of induction with Revlimid, Velcade and dexamethasone  12/09/2018 lumbar spine xray with results revealing "No acute osseous abnormality. Extensive multilevel degenerative changes are noted throughout the lumbar spine."  12/09/2018 sacrum and coccyx xray with results revealing "No acute osseous abnormality. Degenerative changes are noted of both hips."  2. Grade 2 neuropathy -controlled  02/26/18 NCV with EMG which revealed The electrophysiologic findings are most consistent with a chronic, length-dependent sensorimotor axonal and demyelinating polyneuropathy affecting the right side. Overall, these findings are moderate to severe in degree electrically. Incidentally, there is a right Martin-Gruber anastomosis, a normal variant.   3. Rt LE calf veins - possible clot ?chronic.  01/29/18 VAS [place] BLE which revealed Right: Ultrasound is unable to distinguish whether obstruction in the Peroneal veins, and great saphenous vein, and superficial veins/varciosities is acute or chronic. No cystic structure found in the popliteal fossa. Left: no evidence of DVT.    PLAN: -Discussed pt labwork today, 02/08/2021; blood counts and chemistries stable. MMP shows no M spike. -pt completed cancer PT to help build her stamina and strength -Continue to  f/u w PCP for diabetes management. -Continue to f/u w Neurology for neuropathy. -No clinical or laboratory evidence of Multiple Myeloma progression at this time. -Will continue Zometa q12 weeks. -Recommended pt continue to stay up to date with all age related cancer screenings. --Start Vitamin B-Complex daily. Continue Multivitamin daily if pt is taking this. -Will see back in 3 months with labs.   FOLLOW UP: CT abd/pelvis in 1 week for flank pain Phone visit with Dr Irene Limbo in 2 weeks Return to clinic with Dr. Irene Limbo with port flush and labs and for next dose of Zometa in 3 months  . The total time spent in the appointment was 30 minutes and more than 50% was on counseling and direct patient cares.   All of the patient's questions were answered with apparent satisfaction. The patient knows to call the clinic with any problems, questions or concerns.   Sullivan Lone MD [place] [facility] [employer] Hematology/Oncology Physician [facility]  (Office):       [phone] (Work cell):  [phone] (Fax):           [phone]

## 2021-02-20 ENCOUNTER — Ambulatory Visit (HOSPITAL_COMMUNITY)
Admission: RE | Admit: 2021-02-20 | Discharge: 2021-02-20 | Disposition: A | Discharge status: Home / Self Care | Payer: Medicare HMO | Source: Ambulatory Visit | Attending: Hematology | Admitting: Hematology

## 2021-02-20 ENCOUNTER — Other Ambulatory Visit: Payer: Self-pay

## 2021-02-20 DIAGNOSIS — R109 Unspecified abdominal pain: Secondary | ICD-10-CM | POA: Diagnosis not present

## 2021-02-20 DIAGNOSIS — R1011 Right upper quadrant pain: Secondary | ICD-10-CM | POA: Insufficient documentation

## 2021-02-20 DIAGNOSIS — Z794 Long term (current) use of insulin: Secondary | ICD-10-CM | POA: Diagnosis not present

## 2021-02-20 DIAGNOSIS — E119 Type 2 diabetes mellitus without complications: Secondary | ICD-10-CM | POA: Diagnosis not present

## 2021-02-20 DIAGNOSIS — Z7984 Long term (current) use of oral hypoglycemic drugs: Secondary | ICD-10-CM | POA: Diagnosis not present

## 2021-02-20 DIAGNOSIS — H5213 Myopia, bilateral: Secondary | ICD-10-CM | POA: Diagnosis not present

## 2021-02-20 DIAGNOSIS — H2513 Age-related nuclear cataract, bilateral: Secondary | ICD-10-CM | POA: Diagnosis not present

## 2021-02-20 DIAGNOSIS — K760 Fatty (change of) liver, not elsewhere classified: Secondary | ICD-10-CM | POA: Diagnosis not present

## 2021-02-20 MED ORDER — IOHEXOL 350 MG/ML SOLN
100.0000 mL | Freq: Once | INTRAVENOUS | Status: AC | PRN
  Administered 2021-02-20: 80 mL via INTRAVENOUS

## 2021-02-21 DIAGNOSIS — H5213 Myopia, bilateral: Secondary | ICD-10-CM | POA: Diagnosis not present

## 2021-02-22 ENCOUNTER — Inpatient Hospital Stay: Payer: Medicare HMO | Attending: Hematology | Admitting: Hematology

## 2021-02-22 DIAGNOSIS — C9001 Multiple myeloma in remission: Secondary | ICD-10-CM | POA: Insufficient documentation

## 2021-02-22 DIAGNOSIS — R1011 Right upper quadrant pain: Secondary | ICD-10-CM

## 2021-02-28 ENCOUNTER — Encounter: Payer: Self-pay | Admitting: Hematology

## 2021-02-28 NOTE — Progress Notes (Signed)
HEMATOLOGY/ONCOLOGY CLINIC NOTE  Date of Service: .02/22/2021   Patient Care Team: Neale Burly, MD as PCP - General (Internal Medicine) Alda Berthold, DO as Consulting Physician (Neurology)  CHIEF COMPLAINTS/PURPOSE OF CONSULTATION:  F/u for continued mx of Multiple myeloma  Oncologic History:  65 y.o. female who initially presented with significant weight loss in the context of H. pylori-associated gastritis and alcohol abuse. At the same time, an incidental discovery of a lower thoracic vertebral mass in the context of chronic numbness and weakness in the lower extremities. MRI of the thoracic spine demonstrated a destructive lesion at T8 level and patient underwent surgical treatment. 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 Staging evaluation conducted by our clinic confirmed presence of IgG kappa multiple myeloma. Cytogenetics studies demonstrated normal karyotype, FISH studies are positive for 13q34 & D13S319 loss and no loss of p53. Based on initial information, patient was staged at R-[facility] II.    Patient has completed 6 cycles of induction systemic chemotherapy with lenalidomide, bortezomib, and low-dose dexamethasone.  Repeat bone marrow biopsy demonstrated excellent response to treatment and patient subsequently underwent consolidation therapy including melphalan conditioning and autologous stem cell rescue.  Repeat assessment with PET/CT and bone marrow biopsy on 10/13/2017 demonstrates stringent complete response maintenance.  HISTORY OF PRESENTING ILLNESS:   Christine Garrett is a wonderful 65 y.o. female who has been referred to [place] by my colleague Dr Grace Isaac for evaluation and management of Multiple myeloma. The pt reports that she is doing well overall.   The pt has previously been followed by my colleague Dr Grace Isaac and completed 6 cycles of induction with Revlimid, Velcade and dexamethasone. She had a autologous PBSCT on  07/01/17. She is on lenalidomide maintenance currently.   The pt reports that she has had a headache for the past week, noting a constant pounding in the front of her forehead. She denies fevers and is always cold. She is having clear, nasal discharge and seasonal allergies. She notes aspirin has alleviated her headache some.   She is continuing to take Revlimid on 21 day cycles.  Most recent lab results (11/21/17) of CBC and CMP is as follows: all values are WNL.  MMP 11/21/17 is shows no M spike   On review of systems, pt reports headache, moving her bowels well, increasing strength, and denies mouth sores, fevers, night sweats, and any other symptoms.   INTERVAL HISTORY:   I connected with Christine Garrett on 02/28/21 at 10:00 AM EDT by telephone visit and verified that I am speaking with the correct person using two identifiers.   I discussed the limitations, risks, security and privacy concerns of performing an evaluation and management service by telemedicine and the availability of in-person appointments. I also discussed with the patient that there may be a patient responsible charge related to this service. The patient expressed understanding and agreed to proceed.   Christine Garrett was called to discuss her CT results.  She had a CT abd/pelvis for abdominal pain which showed-  1. No acute findings in the abdomen or pelvis. Specifically, no findings to explain the patient's history of right upper quadrant pain. 2. Stable 3 mm perifissural nodule left lung base consistent with benign etiology. 3. Cholelithiasis. 4. Hepatic steatosis. 5. Stable 13 mm left adrenal nodule, likely adenoma. 6. Aortic Atherosclerosis (ICD10-I70.0).  On review of systems, pt reports no other acute new symptoms.  MEDICAL HISTORY:  Past Medical History:  Diagnosis Date  Arthritis    knees   Cancer (HCC)    Carpal tunnel syndrome    CHF (congestive heart failure) (HCC)    Diabetes mellitus     type 2   GERD (gastroesophageal reflux disease)    Heart murmur    "Little, No concerns" per Dr  Dannielle Burn pt reported.   Hypertension    controlled using a guided approch with plasma renin activity   Multiple myeloma not having achieved remission ([place]) 01/06/2017   Neuropathy    Peripheral vascular disease ([place])    Sleep apnea    01/03/2020: per patient, hasn't used CPAP machine in two years due to inability to breathe well with it on    SURGICAL HISTORY: Past Surgical History:  Procedure Laterality Date   BIOPSY  11/28/2016   Procedure: BIOPSY;  Surgeon: Rogene Houston, MD;  [facility];  Service: Endoscopy;;  gastric   BIOPSY  07/02/2019   Procedure: BIOPSY;  Surgeon: Rogene Houston, MD;  [facility];  Service: Endoscopy;;   BTL     1982   CERVICAL CONE BIOPSY     cervical lesion   COLONOSCOPY WITH PROPOFOL N/A 07/02/2019   Procedure: COLONOSCOPY WITH PROPOFOL;  Surgeon: Rogene Houston, MD;  [facility];  Service: Endoscopy;  Laterality: N/A;  12:40 - pt can't come earlier due to transportation   ESOPHAGOGASTRODUODENOSCOPY (EGD) WITH PROPOFOL N/A 11/28/2016   Procedure: ESOPHAGOGASTRODUODENOSCOPY (EGD) WITH PROPOFOL;  Surgeon: Rogene Houston, MD;  [facility];  Service: Endoscopy;  Laterality: N/A;  9:25   IR FLUORO GUIDE PORT INSERTION RIGHT  01/29/2017   IR US GUIDE VASC ACCESS RIGHT  01/29/2017   lipoma removal     right shoulder 2001   LUMBAR FUSION  01/05/2020    Lumbar Two-Three Lumbar Three-Four Lumbar Four-Five Posterior lumbar interbody fusion with decompression (N/A Spine Lumbar)   MULTIPLE EXTRACTIONS WITH ALVEOLOPLASTY Bilateral 08/24/2012   Procedure: MULTIPLE EXTRACTION WITH ALVEOLOPLASTY BIOPSY OF RIGHT AND LEFT MANDIBLE ;  Surgeon: Gae Bon, DDS;  Location: [place];  Service: Oral Surgery;  Laterality: Bilateral;   POLYPECTOMY  07/02/2019   Procedure: POLYPECTOMY;  Surgeon: Rogene Houston, MD;  [facility];  Service: Endoscopy;;   POSTERIOR LUMBAR FUSION 4 LEVEL N/A 12/26/2016   Procedure: THORACIC EIGHT TUMOR RESECTION, THORACIC SIX- THORACIC TEN POSTERIOR SPINAL FUSION;  Surgeon: Ashok Pall, MD;  Location: [facility];  Service: Neurosurgery;  Laterality: N/A;  THORACIC 8 TUMOR RESECTION, THORACIC 6- THORACIC 10 POSTERIOR SPINAL FUSION   ROTATOR CUFF REPAIR     right shoulder   TUBAL LIGATION      SOCIAL HISTORY: Social History   Socioeconomic History   Marital status: Single    Spouse name: Not on file   Number of children: 3   Years of education: Not on file   Highest education level: Not on file  Occupational History   Occupation: on disability   Occupation: former CNA  Tobacco Use   Smoking status: Former    Packs/day: 0.50    Years: 6.00    Pack years: 3.00    Types: Cigarettes    Quit date: 2019    Years since quitting: 3.7   Smokeless tobacco: Never  Vaping Use   Vaping Use: Never used  Substance and Sexual Activity   Alcohol use: No    Alcohol/week: 6.0 standard drinks    Types: 6 Cans of beer per week  Drug use: No   Sexual activity: Not Currently    Birth control/protection: Post-menopausal  Other Topics Concern   Not on file  Social History Narrative   Lives with son, daughter and grandkids in a 2 story home but stays on the first floor.  Has 3 children.  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      Right handed   Social Determinants of Health   Financial Resource Strain: Not on file  Food Insecurity: Not on file  Transportation Needs: Not on file  Physical Activity: Not on file  Stress: Not on file  Social Connections: Not on file  Intimate Partner Violence: Not on file    FAMILY HISTORY: Family History  Problem Relation Age of Onset   Diabetes Mother    Hypertension Mother    Heart disease Mother    Alzheimer's disease Mother    Diabetes Father    Heart disease Sister    Diabetes Sister     ALLERGIES:  is  allergic to hydrocodone.  MEDICATIONS:  Current Outpatient Medications  Medication Sig Dispense Refill   amLODipine (NORVASC) 5 MG tablet Take 5 mg by mouth daily.     apixaban (ELIQUIS) 5 MG TABS tablet Take 1 tablet (5 mg total) by mouth 2 (two) times daily.     atorvastatin (LIPITOR) 40 MG tablet Take 40 mg by mouth daily.     dicyclomine (BENTYL) 10 MG capsule Take 1 capsule (10 mg total) by mouth 3 (three) times daily as needed for spasms. 60 capsule 1   enalapril (VASOTEC) 20 MG tablet Take 20 mg by mouth 2 (two) times daily.      gabapentin (NEURONTIN) 300 MG capsule TAKE 2 CAPSULES (600 MG TOTAL) BY MOUTH 3 (THREE) TIMES DAILY. 540 capsule 0   hydrALAZINE (APRESOLINE) 25 MG tablet Take 25 mg by mouth 2 (two) times daily.     insulin degludec (TRESIBA FLEXTOUCH) 100 UNIT/ML SOPN FlexTouch Pen Inject 0.5 mLs (50 Units total) into the skin daily at 10 pm. (Patient taking differently: Inject 40 Units into the skin daily at 10 pm.) 5 pen 2   Insulin Pen Needle (B-D ULTRAFINE III SHORT PEN) 31G X 8 MM MISC 1 each by Does not apply route as directed. 100 each 3   metFORMIN (GLUCOPHAGE) 1000 MG tablet Take 1,000 mg by mouth 2 (two) times daily.      metoprolol succinate (TOPROL-XL) 50 MG 24 hr tablet Take 50 mg by mouth daily. Take with or immediately following a meal.     Omega-3 Fatty Acids (FISH OIL) 1000 MG CPDR Take 1,000 mg by mouth daily. Omega 3 300 mg     potassium chloride (MICRO-K) 10 MEQ CR capsule Take 10 mEq by mouth daily.     REVLIMID 5 MG capsule TAKE 1 CAPSULE (5MG) BY  MOUTH DAILY FOR 21 DAYS ON, THEN 7 DAYS OFF 21 capsule 0   Vitamin D, Ergocalciferol, (DRISDOL) 50000 units CAPS capsule Take 50,000 Units by mouth every Monday.      No current facility-administered medications for this visit.    REVIEW OF SYSTEMS:   .10 Point review of Systems was done is negative except as noted above.   PHYSICAL EXAMINATION: telemedicine LABORATORY DATA:  I have reviewed the data  as listed  . CBC Latest Ref Rng & Units 02/08/2021 10/19/2020 07/06/2020  WBC 4.0 - 10.5 K/uL 6.6 5.9 5.5  Hemoglobin 12.0 - 15.0 g/dL 12.5  12.1 12.0  Hematocrit 36.0 - 46.0 % 38.1 37.0 37.2  Platelets 150 - 400 K/uL 235 240 233    . CMP Latest Ref Rng & Units 02/08/2021 10/19/2020 07/06/2020  Glucose 70 - 99 mg/dL 112(H) 81 102(H)  BUN 8 - 23 mg/dL '11 9 13  ' Creatinine 0.44 - 1.00 mg/dL 1.15(H) 1.03(H) 1.03(H)  Sodium 135 - 145 mmol/L 142 145 143  Potassium 3.5 - 5.1 mmol/L 4.3 4.0 4.2  Chloride 98 - 111 mmol/L 110 110 112(H)  CO2 22 - 32 mmol/L 24 25 21(L)  Calcium 8.9 - 10.3 mg/dL 9.9 9.9 10.1  Total Protein 6.5 - 8.1 g/dL 7.3 7.7 7.9  Total Bilirubin 0.3 - 1.2 mg/dL 0.5 0.5 0.4  Alkaline Phos 38 - 126 U/L 65 72 89  AST 15 - 41 U/L '23 24 25  ' ALT 0 - 44 U/L '19 19 24   ' Component     Latest Ref Rng & Units 02/19/2019  IgG (Immunoglobin G), Serum     586 - 1,602 mg/dL 1,246  IgA     87 - 352 mg/dL 267  IgM (Immunoglobulin M), Srm     26 - 217 mg/dL 45  Total Protein ELP     6.0 - 8.5 g/dL 6.9  Albumin SerPl Elph-Mcnc     2.9 - 4.4 g/dL 3.6  Alpha 1     0.0 - 0.4 g/dL 0.2  Alpha2 Glob SerPl Elph-Mcnc     0.4 - 1.0 g/dL 0.8  B-Globulin SerPl Elph-Mcnc     0.7 - 1.3 g/dL 1.1  Gamma Glob SerPl Elph-Mcnc     0.4 - 1.8 g/dL 1.3  M Protein SerPl Elph-Mcnc     Not Observed g/dL Not Observed  Globulin, Total     2.2 - 3.9 g/dL 3.3  Albumin/Glob SerPl     0.7 - 1.7 1.1  IFE 1      Comment  Please Note (HCV):      Comment   03/25/18 SPEP and SFLC:     04/21/17 Cytogenetics:    01/17/17 Cytogenetics:    RADIOGRAPHIC STUDIES: I have personally reviewed the radiological images as listed and agreed with the findings in the report. CT Abdomen Pelvis W Contrast  Result Date: 02/20/2021 CLINICAL DATA:  Right upper quadrant and flank pain. History of multiple myeloma. EXAM: CT ABDOMEN AND PELVIS WITH CONTRAST TECHNIQUE: Multidetector CT imaging of the abdomen and pelvis was  performed using the standard protocol following bolus administration of intravenous contrast. CONTRAST:  16m OMNIPAQUE IOHEXOL 350 MG/ML SOLN COMPARISON:  06/25/2019 FINDINGS: Lower chest: Unremarkable. Tiny perifissural nodule identified previously in the inferior left lung is stable on 09/06 today. Hepatobiliary: The liver shows diffusely decreased attenuation suggesting fat deposition. Tiny hypodensities in the left liver and anterior right liver are stable compatible with benign etiology. Tiny layering gallstones evident. No intrahepatic or extrahepatic biliary dilation. Pancreas: No focal mass lesion. No dilatation of the main duct. No intraparenchymal cyst. No peripancreatic edema. Spleen: No splenomegaly. No focal mass lesion. Adrenals/Urinary Tract: Right adrenal gland unremarkable. 13 mm left adrenal nodule is stable in the interval. Kidneys unremarkable. No evidence for hydroureter. The urinary bladder appears normal for the degree of distention. Stomach/Bowel: Stomach is unremarkable. No gastric wall thickening. No evidence of outlet obstruction. Duodenum is normally positioned as is the ligament of Treitz. No small bowel wall thickening. No small bowel dilatation. The terminal ileum is normal. The appendix is normal. No gross colonic mass. No colonic wall  thickening. Vascular/Lymphatic: There is mild atherosclerotic calcification of the abdominal aorta without aneurysm. There is no gastrohepatic or hepatoduodenal ligament lymphadenopathy. No retroperitoneal or mesenteric lymphadenopathy. No pelvic sidewall lymphadenopathy. Reproductive: The uterus is unremarkable.  There is no adnexal mass. Other: No intraperitoneal free fluid. Musculoskeletal: Degenerative changes noted right hip. Lumbosacral fusion hardware evident. Incomplete visualization lower thoracic fusion hardware. No suspicious lytic or sclerotic osseous abnormality. IMPRESSION: 1. No acute findings in the abdomen or pelvis. Specifically, no  findings to explain the patient's history of right upper quadrant pain. 2. Stable 3 mm perifissural nodule left lung base consistent with benign etiology. 3. Cholelithiasis. 4. Hepatic steatosis. 5. Stable 13 mm left adrenal nodule, likely adenoma. 6. Aortic Atherosclerosis (ICD10-I70.0). Electronically Signed   By: Misty Stanley M.D.   On: 02/20/2021 11:31    10/13/17 PET/CT     ASSESSMENT & PLAN:   65 y.o. female with  1. Multiple myeloma Currently in remission Autologous PBSCT on 07/01/17. IgG kappa multiple myeloma. Cytogenetics studies demonstrated normal karyotype, FISH studies are positive for 13q34 & D13S319 loss and no loss of p53. Based on initial information, patient was staged at R-ISS II.  Completed 6 cycles of induction with Revlimid, Velcade and dexamethasone  12/09/2018 lumbar spine xray with results revealing "No acute osseous abnormality. Extensive multilevel degenerative changes are noted throughout the lumbar spine."  12/09/2018 sacrum and coccyx xray with results revealing "No acute osseous abnormality. Degenerative changes are noted of both hips."  2. Grade 2 neuropathy -controlled  02/26/18 NCV with EMG which revealed The electrophysiologic findings are most consistent with a chronic, length-dependent sensorimotor axonal and demyelinating polyneuropathy affecting the right side. Overall, these findings are moderate to severe in degree electrically. Incidentally, there is a right Martin-Gruber anastomosis, a normal variant.   3. Rt LE calf veins - possible clot ?chronic.  01/29/18 VAS [place] BLE which revealed Right: Ultrasound is unable to distinguish whether obstruction in the Peroneal veins, and great saphenous vein, and superficial veins/varciosities is acute or chronic. No cystic structure found in the popliteal fossa. Left: no evidence of DVT.   4. Abdominal pain PLAN: -Discussed pt CT abd/pelvis showing no overt explanation for her abdominal pain -- likely MSK 1.  No acute findings in the abdomen or pelvis. Specifically, no findings to explain the patient's history of right upper quadrant pain. 2. Stable 3 mm perifissural nodule left lung base consistent with benign etiology. 3. Cholelithiasis. 4. Hepatic steatosis. 5. Stable 13 mm left adrenal nodule, likely adenoma. 6. Aortic Atherosclerosis (ICD10-I70.0).   FOLLOW UP:  Return to clinic with Dr. Irene Limbo with port flush and labs and for next dose of Zometa in 3 months  . The total time spent in the appointment was 10 minutes and more than 50% was on counseling and direct patient cares.  All of the patient's questions were answered with apparent satisfaction. The patient knows to call the clinic with any problems, questions or concerns.   Sullivan Lone MD [place] AAHIVMS [facility] Hematology/Oncology Physician [facility]  (Office):       [phone] (Work cell):  [phone] (Fax):           [phone]

## 2021-03-13 DIAGNOSIS — I700 Atherosclerosis of aorta: Secondary | ICD-10-CM | POA: Diagnosis not present

## 2021-03-13 DIAGNOSIS — E1121 Type 2 diabetes mellitus with diabetic nephropathy: Secondary | ICD-10-CM | POA: Diagnosis not present

## 2021-03-13 DIAGNOSIS — M4807 Spinal stenosis, lumbosacral region: Secondary | ICD-10-CM | POA: Diagnosis not present

## 2021-03-13 DIAGNOSIS — Z86711 Personal history of pulmonary embolism: Secondary | ICD-10-CM | POA: Diagnosis not present

## 2021-03-13 DIAGNOSIS — Z6833 Body mass index (BMI) 33.0-33.9, adult: Secondary | ICD-10-CM | POA: Diagnosis not present

## 2021-03-13 DIAGNOSIS — I10 Essential (primary) hypertension: Secondary | ICD-10-CM | POA: Diagnosis not present

## 2021-03-13 DIAGNOSIS — I5042 Chronic combined systolic (congestive) and diastolic (congestive) heart failure: Secondary | ICD-10-CM | POA: Diagnosis not present

## 2021-03-30 DIAGNOSIS — Z1231 Encounter for screening mammogram for malignant neoplasm of breast: Secondary | ICD-10-CM | POA: Diagnosis not present

## 2021-05-04 ENCOUNTER — Inpatient Hospital Stay: Payer: Medicare HMO

## 2021-05-04 ENCOUNTER — Inpatient Hospital Stay: Payer: Medicare HMO | Attending: Hematology | Admitting: Hematology

## 2021-05-04 ENCOUNTER — Other Ambulatory Visit: Payer: Self-pay

## 2021-05-04 ENCOUNTER — Other Ambulatory Visit: Payer: Self-pay | Admitting: *Deleted

## 2021-05-04 VITALS — BP 183/88 | HR 65 | Temp 97.9°F | Resp 18 | Ht 69.0 in | Wt 217.9 lb

## 2021-05-04 DIAGNOSIS — C9001 Multiple myeloma in remission: Secondary | ICD-10-CM

## 2021-05-04 DIAGNOSIS — Z95828 Presence of other vascular implants and grafts: Secondary | ICD-10-CM

## 2021-05-04 LAB — CBC WITH DIFFERENTIAL (CANCER CENTER ONLY)
Abs Immature Granulocytes: 0.01 10*3/uL (ref 0.00–0.07)
Basophils Absolute: 0.1 10*3/uL (ref 0.0–0.1)
Basophils Relative: 1 %
Eosinophils Absolute: 0.2 10*3/uL (ref 0.0–0.5)
Eosinophils Relative: 2 %
HCT: 37.4 % (ref 36.0–46.0)
Hemoglobin: 12.3 g/dL (ref 12.0–15.0)
Immature Granulocytes: 0 %
Lymphocytes Relative: 40 %
Lymphs Abs: 2.9 10*3/uL (ref 0.7–4.0)
MCH: 31.5 pg (ref 26.0–34.0)
MCHC: 32.9 g/dL (ref 30.0–36.0)
MCV: 95.7 fL (ref 80.0–100.0)
Monocytes Absolute: 0.4 10*3/uL (ref 0.1–1.0)
Monocytes Relative: 6 %
Neutro Abs: 3.7 10*3/uL (ref 1.7–7.7)
Neutrophils Relative %: 51 %
Platelet Count: 228 10*3/uL (ref 150–400)
RBC: 3.91 MIL/uL (ref 3.87–5.11)
RDW: 14.1 % (ref 11.5–15.5)
WBC Count: 7.2 10*3/uL (ref 4.0–10.5)
nRBC: 0 % (ref 0.0–0.2)

## 2021-05-04 LAB — CMP (CANCER CENTER ONLY)
ALT: 15 U/L (ref 0–44)
AST: 18 U/L (ref 15–41)
Albumin: 3.9 g/dL (ref 3.5–5.0)
Alkaline Phosphatase: 78 U/L (ref 38–126)
Anion gap: 9 (ref 5–15)
BUN: 16 mg/dL (ref 8–23)
CO2: 23 mmol/L (ref 22–32)
Calcium: 9.9 mg/dL (ref 8.9–10.3)
Chloride: 111 mmol/L (ref 98–111)
Creatinine: 1.47 mg/dL — ABNORMAL HIGH (ref 0.44–1.00)
GFR, Estimated: 40 mL/min — ABNORMAL LOW (ref >60)
Glucose, Bld: 94 mg/dL (ref 70–99)
Potassium: 4 mmol/L (ref 3.5–5.1)
Sodium: 143 mmol/L (ref 135–145)
Total Bilirubin: 0.5 mg/dL (ref 0.3–1.2)
Total Protein: 7.4 g/dL (ref 6.5–8.1)

## 2021-05-04 MED ORDER — ZOLEDRONIC ACID 4 MG/100ML IV SOLN
4.0000 mg | Freq: Once | INTRAVENOUS | Status: AC
  Administered 2021-05-04: 4 mg via INTRAVENOUS
  Filled 2021-05-04: qty 100

## 2021-05-04 MED ORDER — SODIUM CHLORIDE 0.9% FLUSH
10.0000 mL | Freq: Once | INTRAVENOUS | Status: AC
  Administered 2021-05-04: 10 mL

## 2021-05-08 ENCOUNTER — Telehealth: Payer: Self-pay | Admitting: Hematology

## 2021-05-08 LAB — MULTIPLE MYELOMA PANEL, SERUM
Albumin SerPl Elph-Mcnc: 3.8 g/dL (ref 2.9–4.4)
Albumin/Glob SerPl: 1.2 (ref 0.7–1.7)
Alpha 1: 0.2 g/dL (ref 0.0–0.4)
Alpha2 Glob SerPl Elph-Mcnc: 0.9 g/dL (ref 0.4–1.0)
B-Globulin SerPl Elph-Mcnc: 1 g/dL (ref 0.7–1.3)
Gamma Glob SerPl Elph-Mcnc: 1.2 g/dL (ref 0.4–1.8)
Globulin, Total: 3.4 g/dL (ref 2.2–3.9)
IgA: 239 mg/dL (ref 87–352)
IgG (Immunoglobin G), Serum: 1177 mg/dL (ref 586–1602)
IgM (Immunoglobulin M), Srm: 36 mg/dL (ref 26–217)
Total Protein ELP: 7.2 g/dL (ref 6.0–8.5)

## 2021-05-08 NOTE — Telephone Encounter (Signed)
Unable to leave message with follow-up appointment per 11/18 los. Mailed calendar.

## 2021-05-11 ENCOUNTER — Encounter: Payer: Self-pay | Admitting: Hematology

## 2021-05-14 DIAGNOSIS — I10 Essential (primary) hypertension: Secondary | ICD-10-CM | POA: Diagnosis not present

## 2021-05-14 DIAGNOSIS — Z6833 Body mass index (BMI) 33.0-33.9, adult: Secondary | ICD-10-CM | POA: Diagnosis not present

## 2021-05-14 DIAGNOSIS — M25551 Pain in right hip: Secondary | ICD-10-CM | POA: Diagnosis not present

## 2021-05-14 DIAGNOSIS — M4316 Spondylolisthesis, lumbar region: Secondary | ICD-10-CM | POA: Diagnosis not present

## 2021-05-14 NOTE — Progress Notes (Signed)
HEMATOLOGY/ONCOLOGY CLINIC NOTE  Date of Service: .05/04/2021   Patient Care Team: Neale Burly, MD as PCP - General (Internal Medicine) Alda Berthold, DO as Consulting Physician (Neurology)  CHIEF COMPLAINTS/PURPOSE OF CONSULTATION:  F/u for continued mx of Multiple myeloma  Oncologic History:  65 y.o. female who initially presented with significant weight loss in the context of H. pylori-associated gastritis and alcohol abuse. At the same time, an incidental discovery of a lower thoracic vertebral mass in the context of chronic numbness and weakness in the lower extremities. MRI of the thoracic spine demonstrated a destructive lesion at T8 level and patient underwent surgical treatment. 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 Staging evaluation conducted by our clinic confirmed presence of IgG kappa multiple myeloma. Cytogenetics studies demonstrated normal karyotype, FISH studies are positive for 13q34 & D13S319 loss and no loss of p53. Based on initial information, patient was staged at R-ISS II.    Patient has completed 6 cycles of induction systemic chemotherapy with lenalidomide, bortezomib, and low-dose dexamethasone.  Repeat bone marrow biopsy demonstrated excellent response to treatment and patient subsequently underwent consolidation therapy including melphalan conditioning and autologous stem cell rescue.  Repeat assessment with PET/CT and bone marrow biopsy on 10/13/2017 demonstrates stringent complete response maintenance.  HISTORY OF PRESENTING ILLNESS:   Christine Garrett is a wonderful 65 y.o. female who has been referred to [place] by my colleague Dr Grace Isaac for evaluation and management of Multiple myeloma. The pt reports that she is doing well overall.   The pt has previously been followed by my colleague Dr Grace Isaac and completed 6 cycles of induction with Revlimid, Velcade and dexamethasone. She had a autologous PBSCT on  07/01/17. She is on lenalidomide maintenance currently.   The pt reports that she has had a headache for the past week, noting a constant pounding in the front of her forehead. She denies fevers and is always cold. She is having clear, nasal discharge and seasonal allergies. She notes aspirin has alleviated her headache some.   She is continuing to take Revlimid on 21 day cycles.  Most recent lab results (11/21/17) of CBC and CMP is as follows: all values are WNL.  MMP 11/21/17 is shows no M spike   On review of systems, pt reports headache, moving her bowels well, increasing strength, and denies mouth sores, fevers, night sweats, and any other symptoms.   INTERVAL HISTORY:   Christine Garrett is here for continued evaluation and management of her multiple myeloma.  She notes continued issues with neuropathy which are chronic. Patient notes no new bone pains.  No fevers no chills no night sweats. Labs done today 05/04/2021 show normal CBC with no anemia normal WBC count and platelets CMP stable creatinine 1.47 Myeloma panel shows no M protein spike and negative IFE.  On review of systems, pt reports no other acute new symptoms.  MEDICAL HISTORY:  Past Medical History:  Diagnosis Date   Arthritis    knees   Cancer (HCC)    Carpal tunnel syndrome    CHF (congestive heart failure) (HCC)    Diabetes mellitus    type 2   GERD (gastroesophageal reflux disease)    Heart murmur    "Little, No concerns" per Dr  Dannielle Burn pt reported.   Hypertension    controlled using a guided approch with plasma renin activity   Multiple myeloma not having achieved remission ([place]) 01/06/2017   Neuropathy    Peripheral vascular  disease ([place])    Sleep apnea    01/03/2020: per patient, hasn't used CPAP machine in two years due to inability to breathe well with it on    SURGICAL HISTORY: Past Surgical History:  Procedure Laterality Date   BIOPSY  11/28/2016   Procedure: BIOPSY;  Surgeon: Rogene Houston,  MD;  [facility];  Service: Endoscopy;;  gastric   BIOPSY  07/02/2019   Procedure: BIOPSY;  Surgeon: Rogene Houston, MD;  [facility];  Service: Endoscopy;;   BTL     1982   CERVICAL CONE BIOPSY     cervical lesion   COLONOSCOPY WITH PROPOFOL N/A 07/02/2019   Procedure: COLONOSCOPY WITH PROPOFOL;  Surgeon: Rogene Houston, MD;  [facility];  Service: Endoscopy;  Laterality: N/A;  12:40 - pt can't come earlier due to transportation   ESOPHAGOGASTRODUODENOSCOPY (EGD) WITH PROPOFOL N/A 11/28/2016   Procedure: ESOPHAGOGASTRODUODENOSCOPY (EGD) WITH PROPOFOL;  Surgeon: Rogene Houston, MD;  [facility];  Service: Endoscopy;  Laterality: N/A;  9:25   IR FLUORO GUIDE PORT INSERTION RIGHT  01/29/2017   IR US GUIDE VASC ACCESS RIGHT  01/29/2017   lipoma removal     right shoulder 2001   LUMBAR FUSION  01/05/2020    Lumbar Two-Three Lumbar Three-Four Lumbar Four-Five Posterior lumbar interbody fusion with decompression (N/A Spine Lumbar)   MULTIPLE EXTRACTIONS WITH ALVEOLOPLASTY Bilateral 08/24/2012   Procedure: MULTIPLE EXTRACTION WITH ALVEOLOPLASTY BIOPSY OF RIGHT AND LEFT MANDIBLE ;  Surgeon: Gae Bon, DDS;  Location: [place];  Service: Oral Surgery;  Laterality: Bilateral;   POLYPECTOMY  07/02/2019   Procedure: POLYPECTOMY;  Surgeon: Rogene Houston, MD;  [facility];  Service: Endoscopy;;   POSTERIOR LUMBAR FUSION 4 LEVEL N/A 12/26/2016   Procedure: THORACIC EIGHT TUMOR RESECTION, THORACIC SIX- THORACIC TEN POSTERIOR SPINAL FUSION;  Surgeon: Ashok Pall, MD;  Location: [place];  Service: Neurosurgery;  Laterality: N/A;  THORACIC 8 TUMOR RESECTION, THORACIC 6- THORACIC 10 POSTERIOR SPINAL FUSION   ROTATOR CUFF REPAIR     right shoulder   TUBAL LIGATION      SOCIAL HISTORY: Social History   Socioeconomic History   Marital status: Single    Spouse name: Not on file   Number of children: 3   Years of education: Not on file    Highest education level: Not on file  Occupational History   Occupation: on disability   Occupation: former CNA  Tobacco Use   Smoking status: Former    Packs/day: 0.50    Years: 6.00    Pack years: 3.00    Types: Cigarettes    Quit date: 2019    Years since quitting: 3.9   Smokeless tobacco: Never  Vaping Use   Vaping Use: Never used  Substance and Sexual Activity   Alcohol use: No    Alcohol/week: 6.0 standard drinks    Types: 6 Cans of beer per week   Drug use: No   Sexual activity: Not Currently    Birth control/protection: Post-menopausal  Other Topics Concern   Not on file  Social History Narrative   Lives with son, daughter and grandkids in a 2 story home but stays on the first floor.  Has 3 children.  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      Right handed   Social Determinants of Health   Financial Resource Strain: Not on file  Food Insecurity: Not on file  Transportation Needs: Not on file  Physical Activity: Not on file  Stress: Not on file  Social Connections: Not on file  Intimate Partner Violence: Not on file    FAMILY HISTORY: Family History  Problem Relation Age of Onset   Diabetes Mother    Hypertension Mother    Heart disease Mother    Alzheimer's disease Mother    Diabetes Father    Heart disease Sister    Diabetes Sister     ALLERGIES:  is allergic to hydrocodone.  MEDICATIONS:  Current Outpatient Medications  Medication Sig Dispense Refill   amLODipine (NORVASC) 5 MG tablet Take 5 mg by mouth daily.     apixaban (ELIQUIS) 5 MG TABS tablet Take 1 tablet (5 mg total) by mouth 2 (two) times daily.     atorvastatin (LIPITOR) 40 MG tablet Take 40 mg by mouth daily.     dicyclomine (BENTYL) 10 MG capsule Take 1 capsule (10 mg total) by mouth 3 (three) times daily as needed for spasms. 60 capsule 1   enalapril (VASOTEC) 20 MG tablet Take 20 mg by mouth 2 (two) times daily.      gabapentin (NEURONTIN) 300 MG  capsule TAKE 2 CAPSULES (600 MG TOTAL) BY MOUTH 3 (THREE) TIMES DAILY. 540 capsule 0   hydrALAZINE (APRESOLINE) 25 MG tablet Take 25 mg by mouth 2 (two) times daily.     insulin degludec (TRESIBA FLEXTOUCH) 100 UNIT/ML SOPN FlexTouch Pen Inject 0.5 mLs (50 Units total) into the skin daily at 10 pm. (Patient taking differently: Inject 40 Units into the skin daily at 10 pm.) 5 pen 2   Insulin Pen Needle (B-D ULTRAFINE III SHORT PEN) 31G X 8 MM MISC 1 each by Does not apply route as directed. 100 each 3   metFORMIN (GLUCOPHAGE) 1000 MG tablet Take 1,000 mg by mouth 2 (two) times daily.      metoprolol succinate (TOPROL-XL) 50 MG 24 hr tablet Take 50 mg by mouth daily. Take with or immediately following a meal.     Omega-3 Fatty Acids (FISH OIL) 1000 MG CPDR Take 1,000 mg by mouth daily. Omega 3 300 mg     potassium chloride (MICRO-K) 10 MEQ CR capsule Take 10 mEq by mouth daily.     REVLIMID 5 MG capsule TAKE 1 CAPSULE (5MG) BY  MOUTH DAILY FOR 21 DAYS ON, THEN 7 DAYS OFF 21 capsule 0   Vitamin D, Ergocalciferol, (DRISDOL) 50000 units CAPS capsule Take 50,000 Units by mouth every Monday.      No current facility-administered medications for this visit.    REVIEW OF SYSTEMS:   .10 Point review of Systems was done is negative except as noted above.  PHYSICAL EXAMINATION: .BP (!) 183/88 (BP Location: Left Arm, Patient Position: Sitting) Comment: nurse notified  Pulse 65   Temp 97.9 F (36.6 C) (Temporal)   Resp 18   Ht '5\' 9"'  (1.753 m)   Wt 217 lb 14.4 oz (98.8 kg)   SpO2 100%   BMI 32.18 kg/m  . GENERAL:alert, in no acute distress and comfortable SKIN: no acute rashes, no significant lesions EYES: conjunctiva are pink and non-injected, sclera anicteric OROPHARYNX: MMM, no exudates, no oropharyngeal erythema or ulceration NECK: supple, no JVD LYMPH:  no palpable lymphadenopathy in the cervical, axillary or inguinal regions LUNGS: clear to auscultation b/l with normal respiratory  effort HEART: regular rate & rhythm ABDOMEN:  normoactive bowel sounds , non tender, not distended. Extremity:  no pedal edema PSYCH: alert & oriented x 3 with fluent speech NEURO: no focal motor/sensory deficits  LABORATORY DATA:  I have reviewed the data as listed  . CBC Latest Ref Rng & Units 05/04/2021 02/08/2021 10/19/2020  WBC 4.0 - 10.5 K/uL 7.2 6.6 5.9  Hemoglobin 12.0 - 15.0 g/dL 12.3 12.5 12.1  Hematocrit 36.0 - 46.0 % 37.4 38.1 37.0  Platelets 150 - 400 K/uL 228 235 240    . CMP Latest Ref Rng & Units 05/04/2021 02/08/2021 10/19/2020  Glucose 70 - 99 mg/dL 94 112(H) 81  BUN 8 - 23 mg/dL '16 11 9  ' Creatinine 0.44 - 1.00 mg/dL 1.47(H) 1.15(H) 1.03(H)  Sodium 135 - 145 mmol/L 143 142 145  Potassium 3.5 - 5.1 mmol/L 4.0 4.3 4.0  Chloride 98 - 111 mmol/L 111 110 110  CO2 22 - 32 mmol/L '23 24 25  ' Calcium 8.9 - 10.3 mg/dL 9.9 9.9 9.9  Total Protein 6.5 - 8.1 g/dL 7.4 7.3 7.7  Total Bilirubin 0.3 - 1.2 mg/dL 0.5 0.5 0.5  Alkaline Phos 38 - 126 U/L 78 65 72  AST 15 - 41 U/L '18 23 24  ' ALT 0 - 44 U/L '15 19 19   ' Component     Latest Ref Rng & Units 02/19/2019  IgG (Immunoglobin G), Serum     586 - 1,602 mg/dL 1,246  IgA     87 - 352 mg/dL 267  IgM (Immunoglobulin M), Srm     26 - 217 mg/dL 45  Total Protein ELP     6.0 - 8.5 g/dL 6.9  Albumin SerPl Elph-Mcnc     2.9 - 4.4 g/dL 3.6  Alpha 1     0.0 - 0.4 g/dL 0.2  Alpha2 Glob SerPl Elph-Mcnc     0.4 - 1.0 g/dL 0.8  B-Globulin SerPl Elph-Mcnc     0.7 - 1.3 g/dL 1.1  Gamma Glob SerPl Elph-Mcnc     0.4 - 1.8 g/dL 1.3  M Protein SerPl Elph-Mcnc     Not Observed g/dL Not Observed  Globulin, Total     2.2 - 3.9 g/dL 3.3  Albumin/Glob SerPl     0.7 - 1.7 1.1  IFE 1      Comment  Please Note (HCV):      Comment   03/25/18 SPEP and SFLC:     04/21/17 Cytogenetics:    01/17/17 Cytogenetics:    RADIOGRAPHIC STUDIES: I have personally reviewed the radiological images as listed and agreed with the findings in the  report. No results found.  10/13/17 PET/CT     ASSESSMENT & PLAN:   65 y.o. female with  1. Multiple myeloma Currently in remission Autologous PBSCT on 07/01/17. IgG kappa multiple myeloma. Cytogenetics studies demonstrated normal karyotype, FISH studies are positive for 13q34 & D13S319 loss and no loss of p53. Based on initial information, patient was staged at R-ISS II.  Completed 6 cycles of induction with Revlimid, Velcade and dexamethasone  12/09/2018 lumbar spine xray with results revealing "No acute osseous abnormality. Extensive multilevel degenerative changes are noted throughout the lumbar spine."  12/09/2018 sacrum and coccyx xray with results revealing "No acute osseous abnormality. Degenerative changes are noted of both hips."  2. Grade 2 neuropathy -controlled  02/26/18 NCV with EMG which revealed The electrophysiologic findings are most consistent with a chronic, length-dependent sensorimotor axonal and demyelinating polyneuropathy affecting the right side. Overall, these findings are moderate to severe in degree electrically. Incidentally, there is a right Martin-Gruber anastomosis,  a normal variant.   3. Rt LE calf veins - possible clot ?chronic.  01/29/18 VAS [place] BLE which revealed Right: Ultrasound is unable to distinguish whether obstruction in the Peroneal veins, and great saphenous vein, and superficial veins/varciosities is acute or chronic. No cystic structure found in the popliteal fossa. Left: no evidence of DVT.   PLAN: -Labs done today 05/04/2021 show normal CBC with no anemia normal WBC count and platelets CMP stable creatinine 1.47 Myeloma panel shows no M protein spike and negative IFE. -Patient has no lab or clinical evidence of myeloma progression at this time.  FOLLOW UP:  Return to clinic with Dr. Irene Limbo with port flush and labs and for next dose of Zometa in 3 months  The total time spent in the appointment was 20 minutes and more than 50% was on  counseling and direct patient cares.   All of the patient's questions were answered with apparent satisfaction. The patient knows to call the clinic with any problems, questions or concerns.   Sullivan Lone MD [place] AAHIVMS [facility] Hematology/Oncology Physician [facility]

## 2021-05-14 NOTE — Addendum Note (Signed)
Addended by: Sullivan Lone on: 05/14/2021 01:02 AM   Modules accepted: Orders

## 2021-05-29 ENCOUNTER — Ambulatory Visit: Payer: Medicare HMO | Admitting: Orthopaedic Surgery

## 2021-06-06 ENCOUNTER — Other Ambulatory Visit: Payer: Self-pay

## 2021-06-06 ENCOUNTER — Ambulatory Visit (INDEPENDENT_AMBULATORY_CARE_PROVIDER_SITE_OTHER): Payer: Medicare HMO | Admitting: Orthopaedic Surgery

## 2021-06-06 ENCOUNTER — Ambulatory Visit (INDEPENDENT_AMBULATORY_CARE_PROVIDER_SITE_OTHER): Payer: Medicare HMO

## 2021-06-06 DIAGNOSIS — E119 Type 2 diabetes mellitus without complications: Secondary | ICD-10-CM | POA: Diagnosis not present

## 2021-06-06 DIAGNOSIS — M1611 Unilateral primary osteoarthritis, right hip: Secondary | ICD-10-CM

## 2021-06-06 NOTE — Addendum Note (Signed)
Addended by: Precious Bard on: 06/06/2021 01:55 PM   Modules accepted: Orders

## 2021-06-06 NOTE — Progress Notes (Signed)
Office Visit Note   Patient: Christine Garrett           Date of Birth: [birth year]           MRN: [id number] Visit Date: 06/06/2021              Requested by: Christine Burly, MD [place],  [place] [id number] PCP: Christine Burly, MD   Assessment & Plan: Visit Diagnoses:  1. Primary osteoarthritis of right hip     Plan: Based on findings the patient is quite symptomatic from right hip DJD.  She is currently not sure if she is ready to undergo another surgery because she feels like she is still dealing with her back although I do feel that the hips are likely exacerbating the back symptoms.  We did talk about trying a cortisone injection in the hip joint but she declined.  We spoke at length about hip replacement surgery and the associated risk benefits rehab recovery.  She will think about her options and have a discussion with her children and let us know.  We will obtain an A1c level today.  If she decides to go through with surgery we will need the necessary preoperative cardiac and medical clearances.  Follow-Up Instructions: No follow-ups on file.   Orders:  Orders Placed This Encounter  Procedures   XR HIP UNILAT W OR W/O PELVIS 2-3 VIEWS RIGHT   No orders of the defined types were placed in this encounter.     Procedures: No procedures performed   Clinical Data: No additional findings.   Subjective: Chief Complaint  Patient presents with   Right Hip - New Patient (Initial Visit)    Christine Garrett is a very pleasant 65 year old female referral from Christine Garrett for chronic right hip pain.  She underwent back surgery last year by Christine Garrett and she had an uneventful recovery.  She is continue to have groin and hip pain worse on the right which is caused her to walk with a quad cane.  She walks with a limp and her children noticed that she has an antalgic gait.   Review of Systems  Constitutional: Negative.   HENT: Negative.    Eyes: Negative.    Respiratory: Negative.    Cardiovascular: Negative.   Endocrine: Negative.   Musculoskeletal: Negative.   Neurological: Negative.   Hematological: Negative.   Psychiatric/Behavioral: Negative.    All other systems reviewed and are negative.   Objective: Vital Signs: There were no vitals taken for this visit.  Physical Exam Vitals and nursing note reviewed.  Constitutional:      Appearance: She is well-developed.  Pulmonary:     Effort: Pulmonary effort is normal.  Skin:    General: Skin is warm.     Capillary Refill: Capillary refill takes less than 2 seconds.  Neurological:     Mental Status: She is alert and oriented to person, place, and time.  Psychiatric:        Behavior: Behavior normal.        Thought Content: Thought content normal.        Judgment: Judgment normal.    Ortho Exam  Right hip examination shows positive logroll and Stinchfield sign.  FADIR at 15 degrees.  Pain with hip flexion past 70 degrees.  Lateral hip is nontender.  No sciatic tension signs.  Specialty Comments:  No specialty comments available.  Imaging: XR HIP UNILAT W OR W/O PELVIS 2-3 VIEWS RIGHT  Result  Date: 06/06/2021 Advanced degenerative changes to bilateral hips worse on the right.    PMFS History: Patient Active Problem List   Diagnosis Date Noted   Spondylolisthesis of lumbar region 01/05/2020   Generalized abdominal pain 06/03/2019   Port-A-Cath in place 08/21/2018   H/O autologous stem cell transplant ([place]) 07/01/2017   Autologous donor of stem cells 05/20/2017   Phlebitis and thrombophlebitis of superficial vessels of right lower extremity 01/24/2017   Multiple myeloma in remission ([place]) 01/06/2017   Neuropathy 01/06/2017   Hypercholesteremia 11/04/2016   Class 1 obesity due to excess calories with serious comorbidity and body mass index (BMI) of 31.0 to 31.9 in adult 11/04/2016   Gastroesophageal reflux disease without esophagitis 10/30/2016   Abdominal pain,  chronic, epigastric 10/30/2016   OBSTRUCTIVE SLEEP APNEA 08/15/2009   BEN HTN HEART DISEASE WITHOUT HEART FAIL 08/15/2009   Uncontrolled type 2 diabetes mellitus with complication, without long-term current use of insulin 07/27/2009   TOBACCO ABUSE 07/27/2009   Essential hypertension, benign 07/27/2009   Past Medical History:  Diagnosis Date   Arthritis    knees   Cancer (HCC)    Carpal tunnel syndrome    CHF (congestive heart failure) (HCC)    Diabetes mellitus    type 2   GERD (gastroesophageal reflux disease)    Heart murmur    "Little, No concerns" per Dr  Dannielle Burn pt reported.   Hypertension    controlled using a guided approch with plasma renin activity   Multiple myeloma not having achieved remission ([place]) 01/06/2017   Neuropathy    Peripheral vascular disease ([place])    Sleep apnea    01/03/2020: per patient, hasn't used CPAP machine in two years due to inability to breathe well with it on    Family History  Problem Relation Age of Onset   Diabetes Mother    Hypertension Mother    Heart disease Mother    Alzheimer's disease Mother    Diabetes Father    Heart disease Sister    Diabetes Sister     Past Surgical History:  Procedure Laterality Date   BIOPSY  11/28/2016   Procedure: BIOPSY;  Surgeon: Rogene Houston, MD;  [facility];  Service: Endoscopy;;  gastric   BIOPSY  07/02/2019   Procedure: BIOPSY;  Surgeon: Rogene Houston, MD;  [facility];  Service: Endoscopy;;   BTL     1982   CERVICAL CONE BIOPSY     cervical lesion   COLONOSCOPY WITH PROPOFOL N/A 07/02/2019   Procedure: COLONOSCOPY WITH PROPOFOL;  Surgeon: Rogene Houston, MD;  [facility];  Service: Endoscopy;  Laterality: N/A;  12:40 - pt can't come earlier due to transportation   ESOPHAGOGASTRODUODENOSCOPY (EGD) WITH PROPOFOL N/A 11/28/2016   Procedure: ESOPHAGOGASTRODUODENOSCOPY (EGD) WITH PROPOFOL;  Surgeon: Rogene Houston, MD;  [facility];  Service:  Endoscopy;  Laterality: N/A;  9:25   IR FLUORO GUIDE PORT INSERTION RIGHT  01/29/2017   IR US GUIDE VASC ACCESS RIGHT  01/29/2017   lipoma removal     right shoulder 2001   LUMBAR FUSION  01/05/2020    Lumbar Two-Three Lumbar Three-Four Lumbar Four-Five Posterior lumbar interbody fusion with decompression (N/A Spine Lumbar)   MULTIPLE EXTRACTIONS WITH ALVEOLOPLASTY Bilateral 08/24/2012   Procedure: MULTIPLE EXTRACTION WITH ALVEOLOPLASTY BIOPSY OF RIGHT AND LEFT MANDIBLE ;  Surgeon: Gae Bon, DDS;  Location: [place];  Service: Oral Surgery;  Laterality: Bilateral;   POLYPECTOMY  07/02/2019   Procedure: POLYPECTOMY;  Surgeon: Rogene Houston, MD;  [facility];  Service: Endoscopy;;   POSTERIOR LUMBAR FUSION 4 LEVEL N/A 12/26/2016   Procedure: THORACIC EIGHT TUMOR RESECTION, THORACIC SIX- THORACIC TEN POSTERIOR SPINAL FUSION;  Surgeon: Ashok Pall, MD;  Location: [place];  Service: Neurosurgery;  Laterality: N/A;  THORACIC 8 TUMOR RESECTION, THORACIC 6- THORACIC 10 POSTERIOR SPINAL FUSION   ROTATOR CUFF REPAIR     right shoulder   TUBAL LIGATION     Social History   Occupational History   Occupation: on disability   Occupation: former CNA  Tobacco Use   Smoking status: Former    Packs/day: 0.50    Years: 6.00    Pack years: 3.00    Types: Cigarettes    Quit date: 2019    Years since quitting: 3.9   Smokeless tobacco: Never  Vaping Use   Vaping Use: Never used  Substance and Sexual Activity   Alcohol use: No    Alcohol/week: 6.0 standard drinks    Types: 6 Cans of beer per week   Drug use: No   Sexual activity: Not Currently    Birth control/protection: Post-menopausal

## 2021-06-07 LAB — HEMOGLOBIN A1C
Hgb A1c MFr Bld: 5.9 % of total Hgb — ABNORMAL HIGH (ref <5.7)
Mean Plasma Glucose: 123 mg/dL
eAG (mmol/L): 6.8 mmol/L

## 2021-06-07 LAB — EXTRA SPECIMEN

## 2021-06-19 DIAGNOSIS — I5042 Chronic combined systolic (congestive) and diastolic (congestive) heart failure: Secondary | ICD-10-CM | POA: Diagnosis not present

## 2021-06-19 DIAGNOSIS — Z86711 Personal history of pulmonary embolism: Secondary | ICD-10-CM | POA: Diagnosis not present

## 2021-06-19 DIAGNOSIS — M4807 Spinal stenosis, lumbosacral region: Secondary | ICD-10-CM | POA: Diagnosis not present

## 2021-06-19 DIAGNOSIS — I700 Atherosclerosis of aorta: Secondary | ICD-10-CM | POA: Diagnosis not present

## 2021-06-19 DIAGNOSIS — Z6833 Body mass index (BMI) 33.0-33.9, adult: Secondary | ICD-10-CM | POA: Diagnosis not present

## 2021-06-19 DIAGNOSIS — I10 Essential (primary) hypertension: Secondary | ICD-10-CM | POA: Diagnosis not present

## 2021-06-19 DIAGNOSIS — E1121 Type 2 diabetes mellitus with diabetic nephropathy: Secondary | ICD-10-CM | POA: Diagnosis not present

## 2021-06-22 NOTE — Progress Notes (Deleted)
Follow-up Visit   Date: 06/22/21   Christine Garrett MRN: [id number] DOB: 07/21/1955   Interim History: Christine Garrett is a 65 y.o. right-handed African American female with multiple myeloma (2018), hypertension, GERD, diabetes mellitus, and alcohol use returning to the clinic for follow-up of neuropathy.  The patient was accompanied to the clinic by self.  History of present illness: In August 2018, she was diagnosed with IgG kappa multiple myeloma after presenting with weight loss, GI upset, and imaging showing left T8 lytic lesion with cord compression. She was treated with chemotherapy from August 2018 - November 2018 with bortezomib, lenalidomide, and dexamethasone.  She underwent autologous stem cell transplantation in January 2019.  Lenalidomide was discontinued when right DVT was diagnosed.   Starting in late 2018, she began having imbalance, falls, and achy pain involving the legs which transitioned into more of a stabbing pain and numbness which involves her feet and worse on the right.  She also has some tingling involving her lower legs and rarely in the fingertips.  Prior to chemotherapy, she did have some numbness/tingling of the hands and feet.    UPDATE 06/26/2020:  She is here for follow-up of neuropathy.  She continues to have numbness in the feet and hands, nothing which has progressed.  Her pain is controlled on gabapentin 687m TID.  She underwent lumbar decompression in August 2021, no improvement in her leg paresthesias following surgery.  Her diabetes has been uncontrolled lately, last HbA1c (per patient) is 12, but she denies any new changes to lifestyle, diet, or medications.  She continues to drink 2 beers nightly, and expresses wishes to drink more, but her children do not allow her.  No falls.  She uses a cane for assistance.   UPDATE 06/22/2021: She is here for 1 year follow-up. *** her diabetes is much better controlled withlast HbA1c 5.9.   Medications:   Current Outpatient Medications on File Prior to Visit  Medication Sig Dispense Refill   amLODipine (NORVASC) 5 MG tablet Take 5 mg by mouth daily.     apixaban (ELIQUIS) 5 MG TABS tablet Take 1 tablet (5 mg total) by mouth 2 (two) times daily.     atorvastatin (LIPITOR) 40 MG tablet Take 40 mg by mouth daily.     dicyclomine (BENTYL) 10 MG capsule Take 1 capsule (10 mg total) by mouth 3 (three) times daily as needed for spasms. 60 capsule 1   enalapril (VASOTEC) 20 MG tablet Take 20 mg by mouth 2 (two) times daily.      gabapentin (NEURONTIN) 300 MG capsule TAKE 2 CAPSULES (600 MG TOTAL) BY MOUTH 3 (THREE) TIMES DAILY. 540 capsule 0   hydrALAZINE (APRESOLINE) 25 MG tablet Take 25 mg by mouth 2 (two) times daily.     insulin degludec (TRESIBA FLEXTOUCH) 100 UNIT/ML SOPN FlexTouch Pen Inject 0.5 mLs (50 Units total) into the skin daily at 10 pm. (Patient taking differently: Inject 40 Units into the skin daily at 10 pm.) 5 pen 2   Insulin Pen Needle (B-D ULTRAFINE III SHORT PEN) 31G X 8 MM MISC 1 each by Does not apply route as directed. 100 each 3   metFORMIN (GLUCOPHAGE) 1000 MG tablet Take 1,000 mg by mouth 2 (two) times daily.      metoprolol succinate (TOPROL-XL) 50 MG 24 hr tablet Take 50 mg by mouth daily. Take with or immediately following a meal.     Omega-3 Fatty Acids (FISH OIL) 1000 MG CPDR Take 1,000 mg  by mouth daily. Omega 3 300 mg     potassium chloride (MICRO-K) 10 MEQ CR capsule Take 10 mEq by mouth daily.     REVLIMID 5 MG capsule TAKE 1 CAPSULE (5MG) BY  MOUTH DAILY FOR 21 DAYS ON, THEN 7 DAYS OFF 21 capsule 0   Vitamin D, Ergocalciferol, (DRISDOL) 50000 units CAPS capsule Take 50,000 Units by mouth every Monday.      No current facility-administered medications on file prior to visit.    Allergies:  Allergies  Allergen Reactions   Hydrocodone Other (See Comments)    MENTAL STATUS CHANGE  MENTAL STATUS CHANGE   (Pt states she is not allergic to Hydrocodone, she was  recently taking it) MENTAL STATUS CHANGE      Vital Signs:  There were no vitals taken for this visit.    Neurological Exam: MENTAL STATUS including orientation to time, place, person, recent and remote memory, attention span and concentration, language, and fund of knowledge is normal.  Speech is not dysarthric.  CRANIAL NERVES:   Pupils equal round and reactive to light.  Normal conjugate, extra-ocular eye movements in all directions of gaze.  No ptosis.   MOTOR:  Motor strength is 5/5 in all extremities, including distally.  No atrophy, fasciculations or abnormal movements.  No pronator drift.  Tone is normal.    MSRs:  Reflexes are 2+/4 throughout, except absent at the ankles.  SENSORY:  Absent vibration below the ankles, reduced at the MCP, intact at the knees. Rhomberg positive.   COORDINATION/GAIT:  Gait is wide-based, slow, assisted with a cane.   Data:  NCS/EMG of the right side 02/26/2018: The electrophysiologic findings are most consistent with a chronic, length-dependent sensorimotor axonal and demyelinating polyneuropathy affecting the right side. Overall, these findings are moderate to severe in degree electrically.   Incidentally, there is a right Martin-Gruber anastomosis, a normal variant.  Lab Results  Component Value Date   HGBA1C 5.9 (H) 06/06/2021     IMPRESSION/PLAN: Length-dependent peripheral neuropathy secondary to chemotherapy (bortezimb),diabetes, and prior alcohol use, clinically stable.   *** Continue gabapentin 652m TID - refills sent Patient educated on daily foot inspection, fall prevention, and safety precautions around the home.  Return to clinic in 1 year.    Thank you for allowing me to participate in patient's care.  If I can answer any additional questions, I would be pleased to do so.    Sincerely,    Lilli Dewald K. PPosey Pronto DO

## 2021-06-25 ENCOUNTER — Encounter: Payer: Self-pay | Admitting: Neurology

## 2021-06-25 ENCOUNTER — Ambulatory Visit: Payer: Medicare HMO | Admitting: Neurology

## 2021-06-25 DIAGNOSIS — Z029 Encounter for administrative examinations, unspecified: Secondary | ICD-10-CM

## 2021-06-28 ENCOUNTER — Ambulatory Visit: Payer: Medicare Other | Admitting: Neurology

## 2021-07-09 DIAGNOSIS — I340 Nonrheumatic mitral (valve) insufficiency: Secondary | ICD-10-CM | POA: Insufficient documentation

## 2021-07-09 DIAGNOSIS — I10 Essential (primary) hypertension: Secondary | ICD-10-CM | POA: Insufficient documentation

## 2021-07-09 DIAGNOSIS — I2510 Atherosclerotic heart disease of native coronary artery without angina pectoris: Secondary | ICD-10-CM | POA: Insufficient documentation

## 2021-07-23 DIAGNOSIS — I10 Essential (primary) hypertension: Secondary | ICD-10-CM | POA: Diagnosis not present

## 2021-07-23 DIAGNOSIS — I5032 Chronic diastolic (congestive) heart failure: Secondary | ICD-10-CM | POA: Diagnosis not present

## 2021-07-23 DIAGNOSIS — E7800 Pure hypercholesterolemia, unspecified: Secondary | ICD-10-CM | POA: Diagnosis not present

## 2021-07-23 DIAGNOSIS — I2510 Atherosclerotic heart disease of native coronary artery without angina pectoris: Secondary | ICD-10-CM | POA: Diagnosis not present

## 2021-07-24 DIAGNOSIS — Z780 Asymptomatic menopausal state: Secondary | ICD-10-CM | POA: Diagnosis not present

## 2021-07-24 DIAGNOSIS — M810 Age-related osteoporosis without current pathological fracture: Secondary | ICD-10-CM | POA: Diagnosis not present

## 2021-07-27 ENCOUNTER — Other Ambulatory Visit: Payer: Self-pay

## 2021-07-27 ENCOUNTER — Inpatient Hospital Stay: Payer: Medicare HMO

## 2021-07-27 ENCOUNTER — Inpatient Hospital Stay: Payer: Medicare HMO | Attending: Hematology

## 2021-07-27 ENCOUNTER — Inpatient Hospital Stay (HOSPITAL_BASED_OUTPATIENT_CLINIC_OR_DEPARTMENT_OTHER): Payer: Medicare HMO | Admitting: Hematology

## 2021-07-27 VITALS — BP 173/87 | HR 77 | Temp 98.5°F | Resp 18

## 2021-07-27 DIAGNOSIS — C9001 Multiple myeloma in remission: Secondary | ICD-10-CM

## 2021-07-27 DIAGNOSIS — Z95828 Presence of other vascular implants and grafts: Secondary | ICD-10-CM

## 2021-07-27 LAB — CMP (CANCER CENTER ONLY)
ALT: 19 U/L (ref 0–44)
AST: 18 U/L (ref 15–41)
Albumin: 4 g/dL (ref 3.5–5.0)
Alkaline Phosphatase: 72 U/L (ref 38–126)
Anion gap: 6 (ref 5–15)
BUN: 11 mg/dL (ref 8–23)
CO2: 27 mmol/L (ref 22–32)
Calcium: 9.8 mg/dL (ref 8.9–10.3)
Chloride: 110 mmol/L (ref 98–111)
Creatinine: 1.11 mg/dL — ABNORMAL HIGH (ref 0.44–1.00)
GFR, Estimated: 56 mL/min — ABNORMAL LOW (ref >60)
Glucose, Bld: 114 mg/dL — ABNORMAL HIGH (ref 70–99)
Potassium: 4.2 mmol/L (ref 3.5–5.1)
Sodium: 143 mmol/L (ref 135–145)
Total Bilirubin: 0.4 mg/dL (ref 0.3–1.2)
Total Protein: 7 g/dL (ref 6.5–8.1)

## 2021-07-27 LAB — CBC WITH DIFFERENTIAL/PLATELET
Abs Immature Granulocytes: 0.02 10*3/uL (ref 0.00–0.07)
Basophils Absolute: 0.1 10*3/uL (ref 0.0–0.1)
Basophils Relative: 1 %
Eosinophils Absolute: 0.2 10*3/uL (ref 0.0–0.5)
Eosinophils Relative: 2 %
HCT: 35.3 % — ABNORMAL LOW (ref 36.0–46.0)
Hemoglobin: 11.9 g/dL — ABNORMAL LOW (ref 12.0–15.0)
Immature Granulocytes: 0 %
Lymphocytes Relative: 39 %
Lymphs Abs: 2.5 10*3/uL (ref 0.7–4.0)
MCH: 32.7 pg (ref 26.0–34.0)
MCHC: 33.7 g/dL (ref 30.0–36.0)
MCV: 97 fL (ref 80.0–100.0)
Monocytes Absolute: 0.4 10*3/uL (ref 0.1–1.0)
Monocytes Relative: 6 %
Neutro Abs: 3.3 10*3/uL (ref 1.7–7.7)
Neutrophils Relative %: 52 %
Platelets: 228 10*3/uL (ref 150–400)
RBC: 3.64 MIL/uL — ABNORMAL LOW (ref 3.87–5.11)
RDW: 13.5 % (ref 11.5–15.5)
WBC: 6.4 10*3/uL (ref 4.0–10.5)
nRBC: 0 % (ref 0.0–0.2)

## 2021-07-27 MED ORDER — ZOLEDRONIC ACID 4 MG/100ML IV SOLN
4.0000 mg | Freq: Once | INTRAVENOUS | Status: AC
  Administered 2021-07-27: 4 mg via INTRAVENOUS
  Filled 2021-07-27: qty 100

## 2021-07-27 MED ORDER — HEPARIN SOD (PORK) LOCK FLUSH 100 UNIT/ML IV SOLN
500.0000 [IU] | Freq: Once | INTRAVENOUS | Status: AC
Start: 2021-07-27 — End: 2021-07-27
  Administered 2021-07-27: 500 [IU]

## 2021-07-27 MED ORDER — SODIUM CHLORIDE 0.9% FLUSH
10.0000 mL | Freq: Once | INTRAVENOUS | Status: AC
  Administered 2021-07-27: 10 mL

## 2021-07-27 MED ORDER — SODIUM CHLORIDE 0.9 % IV SOLN: INTRAVENOUS | Status: DC

## 2021-07-27 MED ORDER — SODIUM CHLORIDE 0.9% FLUSH
10.0000 mL | Freq: Once | INTRAVENOUS | Status: AC
Start: 2021-07-27 — End: 2021-07-27
  Administered 2021-07-27: 10 mL

## 2021-07-27 NOTE — Patient Instructions (Signed)

## 2021-07-30 ENCOUNTER — Telehealth: Payer: Self-pay | Admitting: Hematology

## 2021-07-30 NOTE — Telephone Encounter (Signed)
Scheduled follow-up appointment per 2/10 los. Patient is aware.

## 2021-07-31 LAB — MULTIPLE MYELOMA PANEL, SERUM
Albumin SerPl Elph-Mcnc: 3.7 g/dL (ref 2.9–4.4)
Albumin/Glob SerPl: 1.2 (ref 0.7–1.7)
Alpha 1: 0.2 g/dL (ref 0.0–0.4)
Alpha2 Glob SerPl Elph-Mcnc: 0.9 g/dL (ref 0.4–1.0)
B-Globulin SerPl Elph-Mcnc: 1 g/dL (ref 0.7–1.3)
Gamma Glob SerPl Elph-Mcnc: 1 g/dL (ref 0.4–1.8)
Globulin, Total: 3.1 g/dL (ref 2.2–3.9)
IgA: 214 mg/dL (ref 87–352)
IgG (Immunoglobin G), Serum: 1156 mg/dL (ref 586–1602)
IgM (Immunoglobulin M), Srm: 34 mg/dL (ref 26–217)
Total Protein ELP: 6.8 g/dL (ref 6.0–8.5)

## 2021-08-03 ENCOUNTER — Encounter: Payer: Self-pay | Admitting: Hematology

## 2021-08-03 NOTE — Progress Notes (Addendum)
HEMATOLOGY/ONCOLOGY CLINIC NOTE  Date of Service: .07/27/2021   Patient Care Team: Neale Burly, MD as PCP - General (Internal Medicine) Alda Berthold, DO as Consulting Physician (Neurology)  CHIEF COMPLAINTS/PURPOSE OF CONSULTATION:  Follow-up for continued evaluation and management of multiple myeloma.  Oncologic History:  66 y.o. female who initially presented with significant weight loss in the context of H. pylori-associated gastritis and alcohol abuse. At the same time, an incidental discovery of a lower thoracic vertebral mass in the context of chronic numbness and weakness in the lower extremities. MRI of the thoracic spine demonstrated a destructive lesion at T8 level and patient underwent surgical treatment. 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 Staging evaluation conducted by our clinic confirmed presence of IgG kappa multiple myeloma. Cytogenetics studies demonstrated normal karyotype, FISH studies are positive for 13q34 & D13S319 loss and no loss of p53. Based on initial information, patient was staged at R-ISS II.    Patient has completed 6 cycles of induction systemic chemotherapy with lenalidomide, bortezomib, and low-dose dexamethasone.  Repeat bone marrow biopsy demonstrated excellent response to treatment and patient subsequently underwent consolidation therapy including melphalan conditioning and autologous stem cell rescue.  Repeat assessment with PET/CT and bone marrow biopsy on 10/13/2017 demonstrates stringent complete response maintenance.  HISTORY OF PRESENTING ILLNESS:  Please see previous note for details on initial presentation  INTERVAL HISTORY:   Christine Garrett is here for continued valuation management of her multiple myeloma.  This is a 24-monthfollow-up since her last clinic visit. She notes symptoms from her chronic neuropathy but no other acute new symptoms. No new focal bone pains. No infection issues. No  significant medication changes.  Labs done today were reviewed in detail.  MEDICAL HISTORY:  Past Medical History:  Diagnosis Date   Arthritis    knees   Cancer (HCC)    Carpal tunnel syndrome    CHF (congestive heart failure) (HCC)    Diabetes mellitus    type 2   GERD (gastroesophageal reflux disease)    Heart murmur    "Little, No concerns" per Dr  DDannielle Burnpt reported.   Hypertension    controlled using a guided approch with plasma renin activity   [place] 01/06[place]   Neuropathy    [facility]ipheral vascular [facility] [place]    Sleep apnea    01/03/2020: per patient, hasn't used CPAP machine in two years due to inability to breathe well with it on    SURGICAL HISTORY: Past Surgical History:  Procedure Laterality Date   BIOPSY  11/28/2016   Procedure: BIOPSY;  Surgeon: RRogene Houston MD;  [facility];  Service: Endoscopy;;  gastric   BIOPSY  07/02/2019   Procedure: BIOPSY;  Surgeon: RRogene Houston MD;  [facility];  Service: Endoscopy;;   BTL     1982   CERVICAL CONE BIOPSY     cervical lesion   COLONOSCOPY WITH PROPOFOL N/A 07/02/2019   Procedure: COLONOSCOPY WITH PROPOFOL;  Surgeon: RRogene Houston MD;  [facility];  Service: Endoscopy;  Laterality: N/A;  12:40 - pt can't come earlier due to transportation   ESOPHAGOGASTRODUODENOSCOPY (EGD) WITH PROPOFOL N/A 11/28/2016   Procedure: ESOPHAGOGASTRODUODENOSCOPY (EGD) WITH PROPOFOL;  Surgeon: RRogene Houston MD;  [facility];  Service: Endoscopy;  Laterality: N/A;  9:25   IR FLUORO GUIDE PORT INSERTION RIGHT  01/29/2017   IR [place] V[place] ACCESS  RIGHT  01/29/2017   lipoma removal     right shoulder 2001   LUMBAR FUSION  01/05/2020    Lumbar Two-Three Lumbar Three-Four Lumbar Four-Five Posterior lumbar interbody fusion with decompression (N/A Spine Lumbar)   MULTIPLE EXTRACTIONS WITH ALVEOLOPLASTY Bilateral 08/24/2012   Procedure:  MULTIPLE EXTRACTION WITH ALVEOLOPLASTY BIOPSY OF RIGHT AND LEFT MANDIBLE ;  Surgeon: Gae Bon, DDS;  Location: [place];  Service: Oral Surgery;  Laterality: Bilateral;   POLYPECTOMY  07/02/2019   Procedure: POLYPECTOMY;  Surgeon: Rogene Houston, MD;  [facility];  Service: Endoscopy;;   POSTERIOR LUMBAR FUSION 4 LEVEL N/A 12/26/2016   Procedure: THORACIC EIGHT TUMOR RESECTION, THORACIC SIX- THORACIC TEN POSTERIOR SPINAL FUSION;  Surgeon: Ashok Pall, MD;  Location: [place];  Service: Neurosurgery;  Laterality: N/A;  THORACIC 8 TUMOR RESECTION, THORACIC 6- THORACIC 10 POSTERIOR SPINAL FUSION   ROTATOR CUFF REPAIR     right shoulder   TUBAL LIGATION      SOCIAL HISTORY: Social History   Socioeconomic History   Marital status: Single    Spouse name: Not on file   Number of children: 3   Years of education: Not on file   Highest education level: Not on file  Occupational History   Occupation: on disability   Occupation: former CNA  Tobacco Use   Smoking status: Former    Packs/day: 0.50    Years: 6.00    Pack years: 3.00    Types: Cigarettes    Quit date: 2019    Years since quitting: 4.1   Smokeless tobacco: Never  Vaping Use   Vaping Use: Never used  Substance and Sexual Activity   Alcohol use: No    Alcohol/week: 6.0 standard drinks    Types: 6 Cans of beer per week   Drug use: No   Sexual activity: Not Currently    Birth control/protection: Post-menopausal  Other Topics Concern   Not on file  Social History Narrative   Lives with son, daughter and grandkids in a 2 story home but stays on the first floor.  Has 3 children.  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      Right handed   Social Determinants of Health   Financial Resource Strain: Not on file  Food Insecurity: Not on file  Transportation Needs: Not on file  Physical Activity: Not on file  Stress: Not on file  Social Connections: Not on file  Intimate Partner  Violence: Not on file    FAMILY HISTORY: Family History  Problem Relation Age of Onset   Diabetes Mother    Hypertension Mother    Heart disease Mother    Alzheimer's disease Mother    Diabetes Father    Heart disease Sister    Diabetes Sister     ALLERGIES:  is allergic to hydrocodone.  MEDICATIONS:  Current Outpatient Medications  Medication Sig Dispense Refill   amLODipine (NORVASC) 5 MG tablet Take 5 mg by mouth daily.     apixaban (ELIQUIS) 5 MG TABS tablet Take 1 tablet (5 mg total) by mouth 2 (two) times daily.     atorvastatin (LIPITOR) 40 MG tablet Take 40 mg by mouth daily.     enalapril (VASOTEC) 20 MG tablet Take 20 mg by mouth 2 (two) times daily.      gabapentin (NEURONTIN) 300 MG capsule TAKE 2 CAPSULES (600 MG TOTAL) BY MOUTH 3 (THREE) TIMES DAILY. 540 capsule 0  hydrALAZINE (APRESOLINE) 25 MG tablet Take 25 mg by mouth 2 (two) times daily.     insulin degludec (TRESIBA FLEXTOUCH) 100 UNIT/ML SOPN FlexTouch Pen Inject 0.5 mLs (50 Units total) into the skin daily at 10 pm. (Patient taking differently: Inject 40 Units into the skin daily at 10 pm.) 5 pen 2   Insulin Pen Needle (B-D ULTRAFINE III SHORT PEN) 31G X 8 MM MISC 1 each by Does not apply route as directed. 100 each 3   metFORMIN (GLUCOPHAGE) 1000 MG tablet Take 1,000 mg by mouth 2 (two) times daily.      metoprolol succinate (TOPROL-XL) 50 MG 24 hr tablet Take 50 mg by mouth daily. Take with or immediately following a meal.     Omega-3 Fatty Acids (FISH OIL) 1000 MG CPDR Take 1,000 mg by mouth daily. Omega 3 300 mg     potassium chloride (MICRO-K) 10 MEQ CR capsule Take 10 mEq by mouth daily.     Vitamin D, Ergocalciferol, (DRISDOL) 50000 units CAPS capsule Take 50,000 Units by mouth every Monday.      dicyclomine (BENTYL) 10 MG capsule Take 1 capsule (10 mg total) by mouth 3 (three) times daily as needed for spasms. (Patient not taking: Reported on 07/27/2021) 60 capsule 1   REVLIMID 5 MG capsule TAKE 1  CAPSULE (5MG) BY  MOUTH DAILY FOR 21 DAYS ON, THEN 7 DAYS OFF 21 capsule 0   No current facility-administered medications for this visit.    REVIEW OF SYSTEMS:   10 Point review of Systems was done is negative except as noted above.  PHYSICAL EXAMINATION: Vital signs reviewed stable NAD GENERAL:alert, in no acute distress and comfortable SKIN: no acute rashes, no significant lesions EYES: conjunctiva are pink and non-injected, sclera anicteric OROPHARYNX: MMM, no exudates, no oropharyngeal erythema or ulceration NECK: supple, no JVD LYMPH:  no palpable lymphadenopathy in the cervical, axillary or inguinal regions LUNGS: clear to auscultation b/l with normal respiratory effort HEART: regular rate & rhythm ABDOMEN:  normoactive bowel sounds , non tender, not distended. Extremity: no pedal edema PSYCH: alert & oriented x 3 with fluent speech NEURO: no focal motor/sensory deficits   LABORATORY DATA:  I have reviewed the data as listed  . CBC Latest Ref Rng & Units 07/27/2021 05/04/2021 02/08/2021  WBC 4.0 - 10.5 K/uL 6.4 7.2 6.6  Hemoglobin 12.0 - 15.0 g/dL 11.9(L) 12.3 12.5  Hematocrit 36.0 - 46.0 % 35.3(L) 37.4 38.1  Platelets 150 - 400 K/uL 228 228 235    . CMP Latest Ref Rng & Units 07/27/2021 05/04/2021 02/08/2021  Glucose 70 - 99 mg/dL 114(H) 94 112(H)  BUN 8 - 23 mg/dL '11 16 11  ' Creatinine 0.44 - 1.00 mg/dL 1.11(H) 1.47(H) 1.15(H)  Sodium 135 - 145 mmol/L 143 143 142  Potassium 3.5 - 5.1 mmol/L 4.2 4.0 4.3  Chloride 98 - 111 mmol/L 110 111 110  CO2 22 - 32 mmol/L '27 23 24  ' Calcium 8.9 - 10.3 mg/dL 9.8 9.9 9.9  Total Protein 6.5 - 8.1 g/dL 7.0 7.4 7.3  Total Bilirubin 0.3 - 1.2 mg/dL 0.4 0.5 0.5  Alkaline Phos 38 - 126 U/L 72 78 65  AST 15 - 41 U/L '18 18 23  ' ALT 0 - 44 U/L '19 15 19      ' 03/25/18 SPEP and SFLC:     04/21/17 Cytogenetics:    01/17/17 Cytogenetics:    RADIOGRAPHIC STUDIES: I have personally reviewed the radiological images as listed and  agreed  with the findings in the report. No results found.  10/13/17 PET/CT     ASSESSMENT & PLAN:   66 y.o. female with  1. Multiple myeloma Currently in remission Autologous PBSCT on 07/01/17. IgG kappa multiple myeloma. Cytogenetics studies demonstrated normal karyotype, FISH studies are positive for 13q34 & D13S319 loss and no loss of p53. Based on initial information, patient was staged at R-ISS II.  Completed 6 cycles of induction with Revlimid, Velcade and dexamethasone  12/09/2018 lumbar spine xray with results revealing "No acute osseous abnormality. Extensive multilevel degenerative changes are noted throughout the lumbar spine."  12/09/2018 sacrum and coccyx xray with results revealing "No acute osseous abnormality. Degenerative changes are noted of both hips."  2. Grade 2 neuropathy -controlled  02/26/18 NCV with EMG which revealed The electrophysiologic findings are most consistent with a chronic, length-dependent sensorimotor axonal and demyelinating polyneuropathy affecting the right side. Overall, these findings are moderate to severe in degree electrically. Incidentally, there is a right Martin-Gruber anastomosis, a normal variant.   3. Rt LE calf veins - possible clot ?chronic.  01/29/18 VAS [place] BLE which revealed Right: Ultrasound is unable to distinguish whether obstruction in the Peroneal veins, and great saphenous vein, and superficial veins/varciosities is acute or chronic. No cystic structure found in the popliteal fossa. Left: no evidence of DVT.   PLAN: -Patient notes no new focal symptoms suggestive of multiple myeloma progression at this time. -Labs done show stable CBC, stable CMP with no hypercalcemia or renal failure. -Myeloma panel shows no M spike -Patient with no evidence of clinical or lab progression of multiple myeloma at this time. -No indication for additional treatment of multiple myeloma at this time.  Patient prefers to stay off maintenance  treatment at this time.  FOLLOW UP:  Return to clinic with Dr. Irene Limbo with port flush and labs and for next dose of Zometa in 3 months   Sullivan Lone MD [facility] Hematology/Oncology Physician [facility]

## 2021-08-12 NOTE — Addendum Note (Signed)
Addended by: Sullivan Lone on: 08/12/2021 04:26 PM   Modules accepted: Orders

## 2021-08-15 DIAGNOSIS — G8929 Other chronic pain: Secondary | ICD-10-CM | POA: Diagnosis not present

## 2021-08-15 DIAGNOSIS — Z794 Long term (current) use of insulin: Secondary | ICD-10-CM | POA: Diagnosis not present

## 2021-08-15 DIAGNOSIS — Z6836 Body mass index (BMI) 36.0-36.9, adult: Secondary | ICD-10-CM | POA: Diagnosis not present

## 2021-08-15 DIAGNOSIS — E1142 Type 2 diabetes mellitus with diabetic polyneuropathy: Secondary | ICD-10-CM | POA: Diagnosis not present

## 2021-08-15 DIAGNOSIS — C9000 Multiple myeloma not having achieved remission: Secondary | ICD-10-CM | POA: Diagnosis not present

## 2021-08-15 DIAGNOSIS — E785 Hyperlipidemia, unspecified: Secondary | ICD-10-CM | POA: Diagnosis not present

## 2021-08-15 DIAGNOSIS — Z7901 Long term (current) use of anticoagulants: Secondary | ICD-10-CM | POA: Diagnosis not present

## 2021-08-15 DIAGNOSIS — I82509 Chronic embolism and thrombosis of unspecified deep veins of unspecified lower extremity: Secondary | ICD-10-CM | POA: Diagnosis not present

## 2021-08-15 DIAGNOSIS — I10 Essential (primary) hypertension: Secondary | ICD-10-CM | POA: Diagnosis not present

## 2021-08-15 DIAGNOSIS — M1990 Unspecified osteoarthritis, unspecified site: Secondary | ICD-10-CM | POA: Diagnosis not present

## 2021-08-15 DIAGNOSIS — Z7984 Long term (current) use of oral hypoglycemic drugs: Secondary | ICD-10-CM | POA: Diagnosis not present

## 2021-09-18 DIAGNOSIS — I10 Essential (primary) hypertension: Secondary | ICD-10-CM | POA: Diagnosis not present

## 2021-09-18 DIAGNOSIS — N1832 Chronic kidney disease, stage 3b: Secondary | ICD-10-CM | POA: Diagnosis not present

## 2021-09-18 DIAGNOSIS — E1121 Type 2 diabetes mellitus with diabetic nephropathy: Secondary | ICD-10-CM | POA: Diagnosis not present

## 2021-09-18 DIAGNOSIS — I5042 Chronic combined systolic (congestive) and diastolic (congestive) heart failure: Secondary | ICD-10-CM | POA: Diagnosis not present

## 2021-09-18 DIAGNOSIS — Z6833 Body mass index (BMI) 33.0-33.9, adult: Secondary | ICD-10-CM | POA: Diagnosis not present

## 2021-09-18 DIAGNOSIS — M4807 Spinal stenosis, lumbosacral region: Secondary | ICD-10-CM | POA: Diagnosis not present

## 2021-09-18 DIAGNOSIS — Z86711 Personal history of pulmonary embolism: Secondary | ICD-10-CM | POA: Diagnosis not present

## 2021-09-18 DIAGNOSIS — I700 Atherosclerosis of aorta: Secondary | ICD-10-CM | POA: Diagnosis not present

## 2021-10-04 ENCOUNTER — Telehealth: Payer: Self-pay | Admitting: Hematology

## 2021-10-04 NOTE — Telephone Encounter (Signed)
Unable to leave message with rescheduled upcoming appointment due to provider's template change. Mailed calendar. ?

## 2021-10-18 ENCOUNTER — Other Ambulatory Visit: Payer: Self-pay

## 2021-10-18 DIAGNOSIS — C9001 Multiple myeloma in remission: Secondary | ICD-10-CM

## 2021-10-19 ENCOUNTER — Inpatient Hospital Stay: Payer: Medicare HMO

## 2021-10-19 ENCOUNTER — Inpatient Hospital Stay (HOSPITAL_BASED_OUTPATIENT_CLINIC_OR_DEPARTMENT_OTHER): Payer: Medicare HMO | Admitting: Hematology

## 2021-10-19 ENCOUNTER — Other Ambulatory Visit: Payer: Self-pay

## 2021-10-19 ENCOUNTER — Inpatient Hospital Stay: Payer: Medicare HMO | Attending: Hematology

## 2021-10-19 ENCOUNTER — Inpatient Hospital Stay: Payer: Medicare HMO | Admitting: Hematology

## 2021-10-19 VITALS — BP 161/80 | HR 85 | Temp 97.3°F | Resp 18 | Wt 228.0 lb

## 2021-10-19 DIAGNOSIS — C9001 Multiple myeloma in remission: Secondary | ICD-10-CM

## 2021-10-19 DIAGNOSIS — Z95828 Presence of other vascular implants and grafts: Secondary | ICD-10-CM

## 2021-10-19 LAB — CBC WITH DIFFERENTIAL (CANCER CENTER ONLY)
Abs Immature Granulocytes: 0.02 10*3/uL (ref 0.00–0.07)
Basophils Absolute: 0.1 10*3/uL (ref 0.0–0.1)
Basophils Relative: 1 %
Eosinophils Absolute: 0.2 10*3/uL (ref 0.0–0.5)
Eosinophils Relative: 3 %
HCT: 35.9 % — ABNORMAL LOW (ref 36.0–46.0)
Hemoglobin: 12.1 g/dL (ref 12.0–15.0)
Immature Granulocytes: 0 %
Lymphocytes Relative: 37 %
Lymphs Abs: 2.3 10*3/uL (ref 0.7–4.0)
MCH: 33.1 pg (ref 26.0–34.0)
MCHC: 33.7 g/dL (ref 30.0–36.0)
MCV: 98.1 fL (ref 80.0–100.0)
Monocytes Absolute: 0.5 10*3/uL (ref 0.1–1.0)
Monocytes Relative: 7 %
Neutro Abs: 3.2 10*3/uL (ref 1.7–7.7)
Neutrophils Relative %: 52 %
Platelet Count: 255 10*3/uL (ref 150–400)
RBC: 3.66 MIL/uL — ABNORMAL LOW (ref 3.87–5.11)
RDW: 13.5 % (ref 11.5–15.5)
WBC Count: 6.2 10*3/uL (ref 4.0–10.5)
nRBC: 0 % (ref 0.0–0.2)

## 2021-10-19 LAB — CMP (CANCER CENTER ONLY)
ALT: 23 U/L (ref 0–44)
AST: 23 U/L (ref 15–41)
Albumin: 4.2 g/dL (ref 3.5–5.0)
Alkaline Phosphatase: 74 U/L (ref 38–126)
Anion gap: 7 (ref 5–15)
BUN: 10 mg/dL (ref 8–23)
CO2: 26 mmol/L (ref 22–32)
Calcium: 10 mg/dL (ref 8.9–10.3)
Chloride: 109 mmol/L (ref 98–111)
Creatinine: 1.05 mg/dL — ABNORMAL HIGH (ref 0.44–1.00)
GFR, Estimated: 60 mL/min — ABNORMAL LOW (ref >60)
Glucose, Bld: 109 mg/dL — ABNORMAL HIGH (ref 70–99)
Potassium: 4.4 mmol/L (ref 3.5–5.1)
Sodium: 142 mmol/L (ref 135–145)
Total Bilirubin: 0.4 mg/dL (ref 0.3–1.2)
Total Protein: 7.3 g/dL (ref 6.5–8.1)

## 2021-10-19 LAB — CEA (IN HOUSE-CHCC): CEA (CHCC-In House): 5.01 ng/mL — ABNORMAL HIGH (ref 0.00–5.00)

## 2021-10-19 MED ORDER — SODIUM CHLORIDE 0.9% FLUSH
10.0000 mL | Freq: Once | INTRAVENOUS | Status: AC
  Administered 2021-10-19: 10 mL

## 2021-10-19 MED ORDER — HEPARIN SOD (PORK) LOCK FLUSH 100 UNIT/ML IV SOLN
500.0000 [IU] | Freq: Once | INTRAVENOUS | Status: AC
  Administered 2021-10-19: 500 [IU]

## 2021-10-19 MED ORDER — ZOLEDRONIC ACID 4 MG/100ML IV SOLN
4.0000 mg | Freq: Once | INTRAVENOUS | Status: AC
  Administered 2021-10-19: 4 mg via INTRAVENOUS
  Filled 2021-10-19: qty 100

## 2021-10-19 NOTE — Patient Instructions (Signed)

## 2021-10-22 ENCOUNTER — Telehealth: Payer: Self-pay | Admitting: Hematology

## 2021-10-22 ENCOUNTER — Encounter: Payer: Self-pay | Admitting: Hematology

## 2021-10-22 NOTE — Progress Notes (Signed)
? ? ?HEMATOLOGY/ONCOLOGY CLINIC NOTE ? ?Date of Service: .10/19/2021 ? ? ?Patient Care Team: ?Neale Burly, MD as PCP - General (Internal Medicine) ?Alda Berthold, DO as Consulting Physician (Neurology) ? ?CHIEF COMPLAINTS/PURPOSE OF CONSULTATION:  ?Follow-up for continued evaluation and management of multiple myeloma ? ?Oncologic History:  ?66 y.o. female who initially presented with significant weight loss in the context of H. pylori-associated gastritis and alcohol abuse. At the same time, an incidental discovery of a lower thoracic vertebral mass in the context of chronic numbness and weakness in the lower extremities. MRI of the thoracic spine demonstrated a destructive lesion at T8 level and patient underwent surgical treatment. 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 Staging evaluation conducted by our clinic confirmed presence of IgG kappa multiple myeloma. Cytogenetics studies demonstrated normal karyotype, FISH studies are positive for 13q34 & D13S319 loss and no loss of p53. Based on initial information, patient was staged at [facility].  ?  ?Patient has completed 6 cycles of induction systemic chemotherapy with lenalidomide, bortezomib, and low-dose dexamethasone.  Repeat bone marrow biopsy demonstrated excellent response to treatment and patient subsequently underwent consolidation therapy including melphalan conditioning and autologous stem cell rescue.  Repeat assessment with PET/CT and bone marrow biopsy on 10/13/2017 demonstrates stringent complete response maintenance. ? ?HISTORY OF PRESENTING ILLNESS:  ?Please see previous note for details on initial presentation ? ?INTERVAL HISTORY:  ? ?Christine Garrett is here for continued evaluation and management of her multiple myeloma . ?Last clinic visit with [place] was about 3 months ago.  She notes no acute new symptoms since her last clinic visit.  No fevers no chills no night sweats.  No new lumps or bumps.  No new  focal bone pains. ?Good p.o. intake.  Weight has been stable. ?No new dental issues. ?Labs done today were discussed in details. ? ?MEDICAL HISTORY:  ?Past Medical History:  ?Diagnosis Date  ? Arthritis   ? knees  ? Cancer [facility])   ? Carpal tunnel syndrome   ? CHF (congestive heart failure) ([place])   ? Diabetes mellitus   ? type 2  ? GERD (gastroesophageal reflux disease)   ? Heart murmur   ? "Little, No concerns" per Dr  Dannielle Burn pt reported.  ? Hypertension   ? controlled using a guided approch with plasma renin activity  ? Multiple myeloma not having achieved remission ([place]) 01/06/2017  ? Neuropathy   ? Peripheral vascular disease ([place])   ? Sleep apnea   ? 01/03/2020: per patient, hasn't used CPAP machine in two years due to inability to breathe well with it on  ? ? ?SURGICAL HISTORY: ?Past Surgical History:  ?Procedure Laterality Date  ? BIOPSY  11/28/2016  ? Procedure: BIOPSY;  Surgeon: Rogene Houston, MD;  [facility];  Service: Endoscopy;;  gastric  ? BIOPSY  07/02/2019  ? Procedure: BIOPSY;  Surgeon: Rogene Houston, MD;  [facility];  Service: Endoscopy;;  ? BTL    ? 1982  ? CERVICAL CONE BIOPSY    ? cervical lesion  ? COLONOSCOPY WITH PROPOFOL N/A 07/02/2019  ? Procedure: COLONOSCOPY WITH PROPOFOL;  Surgeon: Rogene Houston, MD;  [facility];  Service: Endoscopy;  Laterality: N/A;  12:40 - pt can't come earlier due to transportation  ? ESOPHAGOGASTRODUODENOSCOPY (EGD) WITH PROPOFOL N/A 11/28/2016  ? Procedure: ESOPHAGOGASTRODUODENOSCOPY (EGD) WITH PROPOFOL;  Surgeon: Rogene Houston, MD;  [facility];  Service: Endoscopy;  Laterality: N/A;  9:25  ? IR FLUORO GUIDE PORT INSERTION RIGHT  01/29/2017  ? IR US GUIDE VASC ACCESS RIGHT  01/29/2017  ? lipoma removal    ? right shoulder 2001  ? LUMBAR FUSION  01/05/2020  ?  Lumbar Two-Three Lumbar Three-Four Lumbar Four-Five Posterior lumbar interbody fusion with decompression (N/A Spine Lumbar)  ? MULTIPLE EXTRACTIONS WITH  ALVEOLOPLASTY Bilateral 08/24/2012  ? Procedure: MULTIPLE EXTRACTION WITH ALVEOLOPLASTY BIOPSY OF RIGHT AND LEFT MANDIBLE ;  Surgeon: Gae Bon, DDS;  Location: [place];  Service: Oral Surgery;  Laterality: Bilateral;  ? POLYPECTOMY  07/02/2019  ? Procedure: POLYPECTOMY;  Surgeon: Rogene Houston, MD;  [facility];  Service: Endoscopy;;  ? POSTERIOR LUMBAR FUSION 4 LEVEL N/A 12/26/2016  ? Procedure: THORACIC EIGHT TUMOR RESECTION, THORACIC SIX- THORACIC TEN POSTERIOR SPINAL FUSION;  Surgeon: Ashok Pall, MD;  Location: [place];  Service: Neurosurgery;  Laterality: N/A;  THORACIC 8 TUMOR RESECTION, THORACIC 6- THORACIC 10 POSTERIOR SPINAL FUSION  ? ROTATOR CUFF REPAIR    ? right shoulder  ? TUBAL LIGATION    ? ? ?SOCIAL HISTORY: ?Social History  ? ?Socioeconomic History  ? Marital status: Single  ?  Spouse name: Not on file  ? Number of children: 3  ? Years of education: Not on file  ? Highest education level: Not on file  ?Occupational History  ? Occupation: on disability  ? Occupation: former CNA  ?Tobacco Use  ? Smoking status: Former  ?  Packs/day: 0.50  ?  Years: 6.00  ?  Pack years: 3.00  ?  Types: Cigarettes  ?  Quit date: 2019  ?  Years since quitting: 4.3  ? Smokeless tobacco: Never  ?Vaping Use  ? Vaping Use: Never used  ?Substance and Sexual Activity  ? Alcohol use: No  ?  Alcohol/week: 6.0 standard drinks  ?  Types: 6 Cans of beer per week  ? Drug use: No  ? Sexual activity: Not Currently  ?  Birth control/protection: Post-menopausal  ?Other Topics Concern  ? Not on file  ?Social History Narrative  ? Lives with son, daughter and grandkids in a 2 story home but stays on the first floor.  Has 3 children.  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  ?   ? Right handed  ? ?Social Determinants of Health  ? ?Financial Resource Strain: Not on file  ?Food Insecurity: Not on file  ?Transportation Needs: Not on file  ?Physical Activity: Not on file  ?Stress: Not on file  ?Social  Connections: Not on file  ?Intimate Partner Violence: Not on file  ? ? ?FAMILY HISTORY: ?Family History  ?Problem Relation Age of Onset  ? Diabetes Mother   ? Hypertension Mother   ? Heart disease Mother   ? Alzheimer's disease Mother   ? Diabetes Father   ? Heart disease Sister   ? Diabetes Sister   ? ? ?ALLERGIES:  is allergic to hydrocodone. ? ?MEDICATIONS:  ?Current Outpatient Medications  ?Medication Sig Dispense Refill  ? amLODipine (NORVASC) 5 MG tablet Take 5 mg by mouth daily.    ? apixaban (ELIQUIS) 5 MG TABS tablet Take 1 tablet (5 mg total) by mouth 2 (two) times daily.    ? atorvastatin (LIPITOR) 40 MG tablet Take 40 mg by mouth daily.    ? enalapril (VASOTEC) 20 MG tablet Take 20 mg by mouth 2 (two) times daily.     ? gabapentin (NEURONTIN)  300 MG capsule TAKE 2 CAPSULES (600 MG TOTAL) BY MOUTH 3 (THREE) TIMES DAILY. 540 capsule 0  ? hydrALAZINE (APRESOLINE) 25 MG tablet Take 25 mg by mouth 2 (two) times daily.    ? insulin degludec (TRESIBA FLEXTOUCH) 100 UNIT/ML SOPN FlexTouch Pen Inject 0.5 mLs (50 Units total) into the skin daily at 10 pm. (Patient taking differently: Inject 40 Units into the skin daily at 10 pm.) 5 pen 2  ? Insulin Pen Needle (B-D ULTRAFINE III SHORT PEN) 31G X 8 MM MISC 1 each by Does not apply route as directed. 100 each 3  ? metFORMIN (GLUCOPHAGE) 1000 MG tablet Take 1,000 mg by mouth 2 (two) times daily.     ? metoprolol succinate (TOPROL-XL) 50 MG 24 hr tablet Take 50 mg by mouth daily. Take with or immediately following a meal.    ? Omega-3 Fatty Acids (FISH OIL) 1000 MG CPDR Take 1,000 mg by mouth daily. Omega 3 300 mg    ? potassium chloride (MICRO-K) 10 MEQ CR capsule Take 10 mEq by mouth daily.    ? Vitamin D, Ergocalciferol, (DRISDOL) 50000 units CAPS capsule Take 50,000 Units by mouth every Monday.     ? ?No current facility-administered medications for this visit.  ? ? ?REVIEW OF SYSTEMS:   ?10 Point review of Systems was done is negative except as noted  above. ? ?PHYSICAL EXAMINATION: ?.BP (!) 161/80   Pulse 85   Temp (!) 97.3 ?F (36.3 ?C)   Resp 18   Wt 228 lb (103.4 kg)   SpO2 94%   BMI 33.67 kg/m?  ? ?. ?GENERAL:alert, in no acute distress and comfortable ?SKIN: n

## 2021-10-22 NOTE — Telephone Encounter (Signed)
Scheduled follow-up appointment per 5/5 los. Patient is aware. ?

## 2021-10-23 LAB — MULTIPLE MYELOMA PANEL, SERUM
Albumin SerPl Elph-Mcnc: 3.9 g/dL (ref 2.9–4.4)
Albumin/Glob SerPl: 1.3 (ref 0.7–1.7)
Alpha 1: 0.2 g/dL (ref 0.0–0.4)
Alpha2 Glob SerPl Elph-Mcnc: 0.9 g/dL (ref 0.4–1.0)
B-Globulin SerPl Elph-Mcnc: 1 g/dL (ref 0.7–1.3)
Gamma Glob SerPl Elph-Mcnc: 1.1 g/dL (ref 0.4–1.8)
Globulin, Total: 3.2 g/dL (ref 2.2–3.9)
IgA: 241 mg/dL (ref 87–352)
IgG (Immunoglobin G), Serum: 1176 mg/dL (ref 586–1602)
IgM (Immunoglobulin M), Srm: 35 mg/dL (ref 26–217)
Total Protein ELP: 7.1 g/dL (ref 6.0–8.5)

## 2021-11-26 NOTE — Addendum Note (Signed)
Encounter addended by: Annie Paras on: 11/26/2021 4:21 PM  Actions taken: Letter saved

## 2021-12-25 DIAGNOSIS — I10 Essential (primary) hypertension: Secondary | ICD-10-CM | POA: Diagnosis not present

## 2021-12-25 DIAGNOSIS — I700 Atherosclerosis of aorta: Secondary | ICD-10-CM | POA: Diagnosis not present

## 2021-12-25 DIAGNOSIS — N1832 Chronic kidney disease, stage 3b: Secondary | ICD-10-CM | POA: Diagnosis not present

## 2021-12-25 DIAGNOSIS — J301 Allergic rhinitis due to pollen: Secondary | ICD-10-CM | POA: Diagnosis not present

## 2021-12-25 DIAGNOSIS — E1121 Type 2 diabetes mellitus with diabetic nephropathy: Secondary | ICD-10-CM | POA: Diagnosis not present

## 2021-12-25 DIAGNOSIS — Z0000 Encounter for general adult medical examination without abnormal findings: Secondary | ICD-10-CM | POA: Diagnosis not present

## 2021-12-25 DIAGNOSIS — Z86711 Personal history of pulmonary embolism: Secondary | ICD-10-CM | POA: Diagnosis not present

## 2021-12-25 DIAGNOSIS — I5042 Chronic combined systolic (congestive) and diastolic (congestive) heart failure: Secondary | ICD-10-CM | POA: Diagnosis not present

## 2021-12-25 DIAGNOSIS — Z6834 Body mass index (BMI) 34.0-34.9, adult: Secondary | ICD-10-CM | POA: Diagnosis not present

## 2021-12-25 DIAGNOSIS — M4807 Spinal stenosis, lumbosacral region: Secondary | ICD-10-CM | POA: Diagnosis not present

## 2022-01-21 ENCOUNTER — Other Ambulatory Visit: Payer: Self-pay

## 2022-01-21 DIAGNOSIS — I10 Essential (primary) hypertension: Secondary | ICD-10-CM | POA: Diagnosis not present

## 2022-01-21 DIAGNOSIS — E7800 Pure hypercholesterolemia, unspecified: Secondary | ICD-10-CM | POA: Diagnosis not present

## 2022-01-21 DIAGNOSIS — I5020 Unspecified systolic (congestive) heart failure: Secondary | ICD-10-CM | POA: Diagnosis not present

## 2022-01-21 DIAGNOSIS — C9001 Multiple myeloma in remission: Secondary | ICD-10-CM

## 2022-01-21 DIAGNOSIS — I2510 Atherosclerotic heart disease of native coronary artery without angina pectoris: Secondary | ICD-10-CM | POA: Diagnosis not present

## 2022-01-22 ENCOUNTER — Other Ambulatory Visit: Payer: Self-pay

## 2022-01-22 ENCOUNTER — Inpatient Hospital Stay (HOSPITAL_BASED_OUTPATIENT_CLINIC_OR_DEPARTMENT_OTHER): Payer: Medicare HMO | Admitting: Hematology

## 2022-01-22 ENCOUNTER — Inpatient Hospital Stay: Payer: Medicare HMO | Attending: Hematology

## 2022-01-22 ENCOUNTER — Inpatient Hospital Stay: Payer: Medicare HMO

## 2022-01-22 VITALS — BP 177/78 | HR 80 | Resp 18

## 2022-01-22 VITALS — BP 197/104 | HR 88 | Temp 98.1°F | Resp 20 | Wt 227.5 lb

## 2022-01-22 DIAGNOSIS — C9001 Multiple myeloma in remission: Secondary | ICD-10-CM | POA: Diagnosis not present

## 2022-01-22 DIAGNOSIS — Z95828 Presence of other vascular implants and grafts: Secondary | ICD-10-CM

## 2022-01-22 DIAGNOSIS — R970 Elevated carcinoembryonic antigen [CEA]: Secondary | ICD-10-CM | POA: Insufficient documentation

## 2022-01-22 LAB — CEA (IN HOUSE-CHCC): CEA (CHCC-In House): 4.56 ng/mL (ref 0.00–5.00)

## 2022-01-22 LAB — CBC WITH DIFFERENTIAL (CANCER CENTER ONLY)
Abs Immature Granulocytes: 0.01 10*3/uL (ref 0.00–0.07)
Basophils Absolute: 0.1 10*3/uL (ref 0.0–0.1)
Basophils Relative: 1 %
Eosinophils Absolute: 0.1 10*3/uL (ref 0.0–0.5)
Eosinophils Relative: 2 %
HCT: 37.5 % (ref 36.0–46.0)
Hemoglobin: 12.7 g/dL (ref 12.0–15.0)
Immature Granulocytes: 0 %
Lymphocytes Relative: 37 %
Lymphs Abs: 2.4 10*3/uL (ref 0.7–4.0)
MCH: 32.3 pg (ref 26.0–34.0)
MCHC: 33.9 g/dL (ref 30.0–36.0)
MCV: 95.4 fL (ref 80.0–100.0)
Monocytes Absolute: 0.4 10*3/uL (ref 0.1–1.0)
Monocytes Relative: 6 %
Neutro Abs: 3.5 10*3/uL (ref 1.7–7.7)
Neutrophils Relative %: 54 %
Platelet Count: 258 10*3/uL (ref 150–400)
RBC: 3.93 MIL/uL (ref 3.87–5.11)
RDW: 13.1 % (ref 11.5–15.5)
WBC Count: 6.4 10*3/uL (ref 4.0–10.5)
nRBC: 0 % (ref 0.0–0.2)

## 2022-01-22 LAB — CMP (CANCER CENTER ONLY)
ALT: 19 U/L (ref 0–44)
AST: 21 U/L (ref 15–41)
Albumin: 4.3 g/dL (ref 3.5–5.0)
Alkaline Phosphatase: 71 U/L (ref 38–126)
Anion gap: 4 — ABNORMAL LOW (ref 5–15)
BUN: 14 mg/dL (ref 8–23)
CO2: 26 mmol/L (ref 22–32)
Calcium: 9.7 mg/dL (ref 8.9–10.3)
Chloride: 111 mmol/L (ref 98–111)
Creatinine: 1.03 mg/dL — ABNORMAL HIGH (ref 0.44–1.00)
GFR, Estimated: >60 mL/min (ref >60)
Glucose, Bld: 127 mg/dL — ABNORMAL HIGH (ref 70–99)
Potassium: 4.1 mmol/L (ref 3.5–5.1)
Sodium: 141 mmol/L (ref 135–145)
Total Bilirubin: 0.6 mg/dL (ref 0.3–1.2)
Total Protein: 7.5 g/dL (ref 6.5–8.1)

## 2022-01-22 MED ORDER — HEPARIN SOD (PORK) LOCK FLUSH 100 UNIT/ML IV SOLN
500.0000 [IU] | Freq: Once | INTRAVENOUS | Status: AC
  Administered 2022-01-22: 500 [IU]

## 2022-01-22 MED ORDER — ALTEPLASE 2 MG IJ SOLR: 2.0000 mg | Freq: Once | INTRAMUSCULAR | Status: DC | PRN

## 2022-01-22 MED ORDER — SODIUM CHLORIDE 0.9 % IV SOLN: INTRAVENOUS | Status: DC

## 2022-01-22 MED ORDER — SODIUM CHLORIDE 0.9% FLUSH: 3.0000 mL | Freq: Once | INTRAVENOUS | Status: DC | PRN

## 2022-01-22 MED ORDER — SODIUM CHLORIDE 0.9% FLUSH
10.0000 mL | Freq: Once | INTRAVENOUS | Status: AC
  Administered 2022-01-22: 10 mL

## 2022-01-22 MED ORDER — HEPARIN SOD (PORK) LOCK FLUSH 100 UNIT/ML IV SOLN: 250.0000 [IU] | Freq: Once | INTRAVENOUS | Status: DC | PRN

## 2022-01-22 MED ORDER — ZOLEDRONIC ACID 4 MG/100ML IV SOLN: 4.0000 mg | Freq: Once | INTRAVENOUS | Status: DC

## 2022-01-22 MED ORDER — ZOLEDRONIC ACID 4 MG/100ML IV SOLN
4.0000 mg | Freq: Once | INTRAVENOUS | Status: AC
  Administered 2022-01-22: 4 mg via INTRAVENOUS
  Filled 2022-01-22: qty 100

## 2022-01-23 ENCOUNTER — Telehealth: Payer: Self-pay | Admitting: Hematology

## 2022-01-23 NOTE — Progress Notes (Signed)
HEMATOLOGY/ONCOLOGY CLINIC NOTE  Date of Service: 01/22/2022   Patient Care Team: Neale Burly, MD as PCP - General (Internal Medicine) Alda Berthold, DO as Consulting Physician (Neurology)  CHIEF COMPLAINTS/PURPOSE OF CONSULTATION:  Follow-up for continued evaluation and management of multiple myeloma  Oncologic History:  66 y.o. female who initially presented with significant weight loss in the context of H. pylori-associated gastritis and alcohol abuse. At the same time, an incidental discovery of a lower thoracic vertebral mass in the context of chronic numbness and weakness in the lower extremities. MRI of the thoracic spine demonstrated a destructive lesion at T8 level and patient underwent surgical treatment. 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 Staging evaluation conducted by our clinic confirmed presence of IgG kappa multiple myeloma. Cytogenetics studies demonstrated normal karyotype, FISH studies are positive for 13q34 & D13S319 loss and no loss of p53. Based on initial information, patient was staged at [facility].    Patient has completed 6 cycles of induction systemic chemotherapy with lenalidomide, bortezomib, and low-dose dexamethasone.  Repeat bone marrow biopsy demonstrated excellent response to treatment and patient subsequently underwent consolidation therapy including melphalan conditioning and autologous stem cell rescue.  Repeat assessment with PET/CT and bone marrow biopsy on 10/13/2017 demonstrates stringent complete response maintenance.  HISTORY OF PRESENTING ILLNESS:  Please see previous note for details on initial presentation  INTERVAL HISTORY:   Christine Garrett is a 66 y.o. female here for continued evaluation and management of her multiple myeloma. Last clinic visit with [place] was about 3 months ago. She reports She is doing well with no new symptoms or concerns. Good p.o intake. No new dental issues. No new focal  bone pains. No fever, chills, night sweats. No new lumps, bumps, or lesions/rashes. No new or unexpected weight loss. No other new or acute focal symptoms. Continues to have symptoms related to her chronic neuropathy. Labs done today were discussed in details.  MEDICAL HISTORY:  Past Medical History:  Diagnosis Date   Arthritis    knees   Cancer ([facility])    Carpal tunnel syndrome    CHF (congestive heart failure) ([facility])    Diabetes mellitus    type 2   GERD (gastroesophageal reflux disease)    Heart murmur    "Little, No concerns" per Dr  Dannielle Burn pt reported.   Hypertension    controlled using a guided approch with plasma renin activity   Multiple myeloma not having achieved remission ([place]) 01/06/2017   Neuropathy    Peripheral vascular disease ([place])    Sleep apnea    01/03/2020: per patient, hasn't used CPAP machine in two years due to inability to breathe well with it on    SURGICAL HISTORY: Past Surgical History:  Procedure Laterality Date   BIOPSY  11/28/2016   Procedure: BIOPSY;  Surgeon: Rogene Houston, MD;  [facility];  Service: Endoscopy;;  gastric   BIOPSY  07/02/2019   Procedure: BIOPSY;  Surgeon: Rogene Houston, MD;  [facility];  Service: Endoscopy;;   BTL     1982   CERVICAL CONE BIOPSY     cervical lesion   COLONOSCOPY WITH PROPOFOL N/A 07/02/2019   Procedure: COLONOSCOPY WITH PROPOFOL;  Surgeon: Rogene Houston, MD;  [facility];  Service: Endoscopy;  Laterality: N/A;  12:40 - pt can't come earlier due to transportation   ESOPHAGOGASTRODUODENOSCOPY (EGD) WITH PROPOFOL N/A 11/28/2016   Procedure: ESOPHAGOGASTRODUODENOSCOPY (EGD) WITH PROPOFOL;  Surgeon: Rogene Houston, MD;  [facility];  Service: Endoscopy;  Laterality: N/A;  9:25   IR FLUORO GUIDE PORT INSERTION RIGHT  01/29/2017   IR US GUIDE VASC ACCESS RIGHT  01/29/2017   lipoma removal     right shoulder 2001   LUMBAR FUSION  01/05/2020    Lumbar Two-Three  Lumbar Three-Four Lumbar Four-Five Posterior lumbar interbody fusion with decompression (N/A Spine Lumbar)   MULTIPLE EXTRACTIONS WITH ALVEOLOPLASTY Bilateral 08/24/2012   Procedure: MULTIPLE EXTRACTION WITH ALVEOLOPLASTY BIOPSY OF RIGHT AND LEFT MANDIBLE ;  Surgeon: Gae Bon, DDS;  Location: [place];  Service: Oral Surgery;  Laterality: Bilateral;   POLYPECTOMY  07/02/2019   Procedure: POLYPECTOMY;  Surgeon: Rogene Houston, MD;  [facility];  Service: Endoscopy;;   POSTERIOR LUMBAR FUSION 4 LEVEL N/A 12/26/2016   Procedure: THORACIC EIGHT TUMOR RESECTION, THORACIC SIX- THORACIC TEN POSTERIOR SPINAL FUSION;  Surgeon: Ashok Pall, MD;  Location: [place];  Service: Neurosurgery;  Laterality: N/A;  THORACIC 8 TUMOR RESECTION, THORACIC 6- THORACIC 10 POSTERIOR SPINAL FUSION   ROTATOR CUFF REPAIR     right shoulder   TUBAL LIGATION      SOCIAL HISTORY: Social History   Socioeconomic History   Marital status: Single    Spouse name: Not on file   Number of children: 3   Years of education: Not on file   Highest education level: Not on file  Occupational History   Occupation: on disability   Occupation: former CNA  Tobacco Use   Smoking status: Former    Packs/day: 0.50    Years: 6.00    Total pack years: 3.00    Types: Cigarettes    Quit date: 2019    Years since quitting: 4.6   Smokeless tobacco: Never  Vaping Use   Vaping Use: Never used  Substance and Sexual Activity   Alcohol use: No    Alcohol/week: 6.0 standard drinks of alcohol    Types: 6 Cans of beer per week   Drug use: No   Sexual activity: Not Currently    Birth control/protection: Post-menopausal  Other Topics Concern   Not on file  Social History Narrative   Lives with son, daughter and grandkids in a 2 story home but stays on the first floor.  Has 3 children.  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      Right handed   Social Determinants of Health   Financial  Resource Strain: Not on file  Food Insecurity: Not on file  Transportation Needs: Not on file  Physical Activity: Not on file  Stress: Not on file  Social Connections: Not on file  Intimate Partner Violence: Not on file    FAMILY HISTORY: Family History  Problem Relation Age of Onset   Diabetes Mother    Hypertension Mother    Heart disease Mother    Alzheimer's disease Mother    Diabetes Father    Heart disease Sister    Diabetes Sister     ALLERGIES:  is allergic to hydrocodone.  MEDICATIONS:  Current Outpatient Medications  Medication Sig Dispense Refill   amLODipine (NORVASC) 5 MG tablet Take 5 mg by mouth daily.     apixaban (ELIQUIS) 5 MG TABS tablet Take 1 tablet (5 mg total) by mouth 2 (two) times daily.     atorvastatin (LIPITOR) 40 MG tablet Take 40 mg by mouth daily.     enalapril (  VASOTEC) 20 MG tablet Take 20 mg by mouth 2 (two) times daily.      gabapentin (NEURONTIN) 300 MG capsule TAKE 2 CAPSULES (600 MG TOTAL) BY MOUTH 3 (THREE) TIMES DAILY. 540 capsule 0   hydrALAZINE (APRESOLINE) 25 MG tablet Take 25 mg by mouth 2 (two) times daily.     insulin degludec (TRESIBA FLEXTOUCH) 100 UNIT/ML SOPN FlexTouch Pen Inject 0.5 mLs (50 Units total) into the skin daily at 10 pm. (Patient taking differently: Inject 40 Units into the skin daily at 10 pm.) 5 pen 2   Insulin Pen Needle (B-D ULTRAFINE III SHORT PEN) 31G X 8 MM MISC 1 each by Does not apply route as directed. 100 each 3   metFORMIN (GLUCOPHAGE) 1000 MG tablet Take 1,000 mg by mouth 2 (two) times daily.      metoprolol succinate (TOPROL-XL) 50 MG 24 hr tablet Take 50 mg by mouth daily. Take with or immediately following a meal.     Omega-3 Fatty Acids (FISH OIL) 1000 MG CPDR Take 1,000 mg by mouth daily. Omega 3 300 mg     potassium chloride (MICRO-K) 10 MEQ CR capsule Take 10 mEq by mouth daily.     Vitamin D, Ergocalciferol, (DRISDOL) 50000 units CAPS capsule Take 50,000 Units by mouth every Monday.      No  current facility-administered medications for this visit.    REVIEW OF SYSTEMS:   10 Point review of Systems was done is negative except as noted above.  PHYSICAL EXAMINATION: .BP (!) 197/104 Comment: RN Beth W aware  Pulse 88   Temp 98.1 F (36.7 C)   Resp 20   Wt 227 lb 8 oz (103.2 kg)   SpO2 100%   BMI 33.60 kg/m  NAD GENERAL:alert, in no acute distress and comfortable SKIN: no acute rashes, no significant lesions EYES: conjunctiva are pink and non-injected, sclera anicteric NECK: supple, no JVD LYMPH:  no palpable lymphadenopathy in the cervical, axillary or inguinal regions LUNGS: clear to auscultation b/l with normal respiratory effort HEART: regular rate & rhythm ABDOMEN:  normoactive bowel sounds , non tender, not distended. Extremity: no pedal edema PSYCH: alert & oriented x 3 with fluent speech NEURO: no focal motor/sensory deficits  LABORATORY DATA:  I have reviewed the data as listed  .    Latest Ref Rng & Units 01/22/2022   10:26 AM 10/19/2021   11:35 AM 07/27/2021    9:40 AM  CBC  WBC 4.0 - 10.5 K/uL 6.4  6.2  6.4   Hemoglobin 12.0 - 15.0 g/dL 12.7  12.1  11.9   Hematocrit 36.0 - 46.0 % 37.5  35.9  35.3   Platelets 150 - 400 K/uL 258  255  228     .    Latest Ref Rng & Units 01/22/2022   10:26 AM 10/19/2021   11:40 AM 07/27/2021    9:40 AM  CMP  Glucose 70 - 99 mg/dL 127  109  114   BUN 8 - 23 mg/dL '14  10  11   ' Creatinine 0.44 - 1.00 mg/dL 1.03  1.05  1.11   Sodium 135 - 145 mmol/L 141  142  143   Potassium 3.5 - 5.1 mmol/L 4.1  4.4  4.2   Chloride 98 - 111 mmol/L 111  109  110   CO2 22 - 32 mmol/L '26  26  27   ' Calcium 8.9 - 10.3 mg/dL 9.7  10.0  9.8   Total Protein 6.5 -  8.1 g/dL 7.5  7.3  7.0   Total Bilirubin 0.3 - 1.2 mg/dL 0.6  0.4  0.4   Alkaline Phos 38 - 126 U/L 71  74  72   AST 15 - 41 U/L '21  23  18   ' ALT 0 - 44 U/L '19  23  19       ' 03/25/18 SPEP and SFLC:     04/21/17 Cytogenetics:    01/17/17 Cytogenetics:    RADIOGRAPHIC  STUDIES: I have personally reviewed the radiological images as listed and agreed with the findings in the report. No results found.  10/13/17 PET/CT     ASSESSMENT & PLAN:   66 y.o. female with  1. Multiple myeloma Currently in remission Autologous PBSCT on 07/01/17. IgG kappa multiple myeloma. Cytogenetics studies demonstrated normal karyotype, FISH studies are positive for 13q34 & D13S319 loss and no loss of p53. Based on initial information, patient was staged at [facility].  Completed 6 cycles of induction with Revlimid, Velcade and dexamethasone  12/09/2018 lumbar spine xray with results revealing "No acute osseous abnormality. Extensive multilevel degenerative changes are noted throughout the lumbar spine."  12/09/2018 sacrum and coccyx xray with results revealing "No acute osseous abnormality. Degenerative changes are noted of both hips."  2. Grade 2 neuropathy -controlled  02/26/18 NCV with EMG which revealed The electrophysiologic findings are most consistent with a chronic, length-dependent sensorimotor axonal and demyelinating polyneuropathy affecting the right side. Overall, these findings are moderate to severe in degree electrically. Incidentally, there is a right Martin-Gruber anastomosis, a normal variant.   3. Rt LE calf veins - possible clot ?chronic.  01/29/18 VAS [place] BLE which revealed Right: Ultrasound is unable to distinguish whether obstruction in the Peroneal veins, and great saphenous vein, and superficial veins/varciosities is acute or chronic. No cystic structure found in the popliteal fossa. Left: no evidence of DVT.   PLAN: -Patient has no symptoms suggestive of myeloma progression at this time. -Labs done today show stable CBC and CMP -Myeloma panel shows no M spike suggesting patient did need to be in remission. -CEA is 4.56 -Patient will continue her maintenance Zometa every 3 months.  Patient notes no new dental issues. -No indication for additional  myeloma treatment at this time. -Zometa orders placed and signed  FOLLOW UP:  Return to clinic with Dr. Irene Limbo with labs and next dose of Zometa in 3 months  The total time spent in the appointment was 20 minutes*.  All of the patient's questions were answered with apparent satisfaction. The patient knows to call the clinic with any problems, questions or concerns.   Sullivan Lone MD MS AAHIVMS [facility] Hematology/Oncology Physician [facility]  .*Total Encounter Time as defined by the Centers for Medicare and Medicaid Services includes, in addition to the face-to-face time of a patient visit (documented in the note above) non-face-to-face time: obtaining and reviewing outside history, ordering and reviewing medications, tests or procedures, care coordination (communications with other health care professionals or caregivers) and documentation in the medical record.  I, Melene Muller, am acting as scribe for Dr. Sullivan Lone, MD.  .I have reviewed the above documentation for accuracy and completeness, and I agree with the above.Brunetta Genera MD

## 2022-01-23 NOTE — Telephone Encounter (Signed)
Scheduled follow-up appointment per 8/8 los. Patient is aware. 

## 2022-01-25 LAB — MULTIPLE MYELOMA PANEL, SERUM
Albumin SerPl Elph-Mcnc: 3.7 g/dL (ref 2.9–4.4)
Albumin/Glob SerPl: 1.2 (ref 0.7–1.7)
Alpha 1: 0.2 g/dL (ref 0.0–0.4)
Alpha2 Glob SerPl Elph-Mcnc: 0.9 g/dL (ref 0.4–1.0)
B-Globulin SerPl Elph-Mcnc: 1 g/dL (ref 0.7–1.3)
Gamma Glob SerPl Elph-Mcnc: 1 g/dL (ref 0.4–1.8)
Globulin, Total: 3.1 g/dL (ref 2.2–3.9)
IgA: 223 mg/dL (ref 87–352)
IgG (Immunoglobin G), Serum: 1110 mg/dL (ref 586–1602)
IgM (Immunoglobulin M), Srm: 36 mg/dL (ref 26–217)
Total Protein ELP: 6.8 g/dL (ref 6.0–8.5)

## 2022-01-27 DIAGNOSIS — W260XXA Contact with knife, initial encounter: Secondary | ICD-10-CM | POA: Diagnosis not present

## 2022-01-27 DIAGNOSIS — E119 Type 2 diabetes mellitus without complications: Secondary | ICD-10-CM | POA: Diagnosis not present

## 2022-01-27 DIAGNOSIS — Z7984 Long term (current) use of oral hypoglycemic drugs: Secondary | ICD-10-CM | POA: Diagnosis not present

## 2022-01-27 DIAGNOSIS — Z87891 Personal history of nicotine dependence: Secondary | ICD-10-CM | POA: Diagnosis not present

## 2022-01-27 DIAGNOSIS — S91312A Laceration without foreign body, left foot, initial encounter: Secondary | ICD-10-CM | POA: Diagnosis not present

## 2022-01-27 DIAGNOSIS — R0689 Other abnormalities of breathing: Secondary | ICD-10-CM | POA: Diagnosis not present

## 2022-01-27 DIAGNOSIS — I110 Hypertensive heart disease with heart failure: Secondary | ICD-10-CM | POA: Diagnosis not present

## 2022-01-27 DIAGNOSIS — Z743 Need for continuous supervision: Secondary | ICD-10-CM | POA: Diagnosis not present

## 2022-01-27 DIAGNOSIS — I509 Heart failure, unspecified: Secondary | ICD-10-CM | POA: Diagnosis not present

## 2022-01-27 DIAGNOSIS — I10 Essential (primary) hypertension: Secondary | ICD-10-CM | POA: Diagnosis not present

## 2022-01-28 ENCOUNTER — Other Ambulatory Visit: Payer: Self-pay | Admitting: *Deleted

## 2022-01-28 ENCOUNTER — Encounter: Payer: Self-pay | Admitting: Hematology

## 2022-01-28 NOTE — Patient Outreach (Signed)
  Care Coordination   01/28/2022 Name: Christine Garrett MRN: [id number] DOB: [birth year]   Care Coordination Outreach Attempts:  An unsuccessful telephone outreach was attempted today to offer the patient information about available care coordination services as a benefit of their health plan.   Follow Up Plan:  Additional outreach attempts will be made to offer the patient care coordination information and services.   Encounter Outcome:  No Answer  Care Coordination Interventions Activated:  No   Care Coordination Interventions:  No, not indicated    Valente David, RN, MSN, [facility] Care Coordinator [phone]

## 2022-01-30 ENCOUNTER — Encounter: Payer: Self-pay | Admitting: *Deleted

## 2022-01-30 ENCOUNTER — Telehealth: Payer: Self-pay

## 2022-01-30 ENCOUNTER — Other Ambulatory Visit: Payer: Self-pay | Admitting: *Deleted

## 2022-01-30 DIAGNOSIS — I10 Essential (primary) hypertension: Secondary | ICD-10-CM

## 2022-01-30 DIAGNOSIS — Z6834 Body mass index (BMI) 34.0-34.9, adult: Secondary | ICD-10-CM | POA: Diagnosis not present

## 2022-01-30 NOTE — Patient Instructions (Signed)
Visit Information  Thank you for taking time to visit with me today. Please don't hesitate to contact me if I can be of assistance to you before our next scheduled telephone appointment.  Following are the goals we discussed today:  Obtain blood pressure machine and monitor daily, recording readings to share with PCP.  Our next appointment is by telephone on 9/22  Patient verbalizes understanding of instructions and care plan provided today and agrees to view in [place]. Active MyChart status and patient understanding of how to access instructions and care plan via MyChart confirmed with patient.     The patient has been provided with contact information for the care management team and has been advised to call with any health related questions or concerns.   Valente David, RN, MSN, [facility] Care Coordinator [phone]

## 2022-01-30 NOTE — Progress Notes (Signed)
Contacted pt per Dr Irene Limbo: to let the patient know that her myeloma panel shows no M spike and she appears to remain in remission from her myeloma at this time.  Pt acknowledged information and verbalized understanding.

## 2022-01-30 NOTE — Patient Outreach (Addendum)
  Care Coordination   Initial Visit Note   01/30/2022 Name: Christine Garrett MRN: [id number] DOB: December 18, 1955  Christine Garrett is a 66 y.o. year old female who sees Hasanaj, Samul Dada, MD for primary care. I spoke with  Christine Garrett by phone today  What matters to the patients health and wellness today?  Was seen in the ED on 8/13, had kitchen accident where she dropped knife on foot.  Blood pressure was elevated during visit,  followed up with PCP today where medications were changed.  She does not have monitor to check numbers daily, state she is unable to afford at this time as she is currently having trouble with paying bills.  Agrees to [name] care guide, also will place referral to pharmacy team as she is interested in medication delivery (sometimes have trouble getting to pharmacy for med pickup).  Unsure of when her last AWV was done.    Goals Addressed               This Visit's Progress     Control BP (pt-stated)        Care Coordination Interventions: Evaluation of current treatment plan related to hypertension self management and patient's adherence to plan as established by provider Provided education to patient re: stroke prevention, s/s of heart attack and stroke Provided assistance with obtaining home blood pressure monitor via Encouraged to purchase machine and monitor daily, recording readings; Reviewed scheduled/upcoming provider appointments including:  Discussed complications of poorly controlled blood pressure such as heart disease, stroke, circulatory complications, vision complications, kidney impairment, sexual dysfunction Assessed social determinant of health barriers        SDOH assessments and interventions completed:  Yes  SDOH Interventions Today    Flowsheet Row Most Recent Value  SDOH Interventions   Food Insecurity Interventions Intervention Not Indicated  Housing Interventions Other (Comment)  [Community Resource care guide  referral]  Transportation Interventions Intervention Not Indicated  [Uses [facility]]        Care Coordination Interventions Activated:  Yes  Care Coordination Interventions:  Yes, provided   Follow up plan: Referral made to Pharmacy team and Community resource care guide Follow up call scheduled for 9/22   Will have administration assistant send member BP monitor.  Encounter Outcome:  Pt. Visit Completed   Christine David, RN, MSN, [facility] Care Coordinator [phone]

## 2022-01-30 NOTE — Telephone Encounter (Signed)
   Telephone encounter was:  Successful.  01/30/2022 Name: Christine Garrett MRN: [id number] DOB: [birth year]  Christine Garrett is a 66 y.o. year old female who is a primary care patient of Hasanaj, Samul Dada, MD . The community resource team was consulted for assistance with  [place] guide performed the following interventions: Patient provided with information about care guide support team and interviewed to confirm resource needs.PT wanted resources for activities and community resources, community resources and free cell information  Follow Up Plan:  No further follow up planned at this time. The patient has been provided with needed resources.    [place], Care Management  [phone] [address], [place], [place] [address] Phone: [phone] Email: [email or web address]

## 2022-02-14 DIAGNOSIS — E1121 Type 2 diabetes mellitus with diabetic nephropathy: Secondary | ICD-10-CM | POA: Diagnosis not present

## 2022-02-14 DIAGNOSIS — I10 Essential (primary) hypertension: Secondary | ICD-10-CM | POA: Diagnosis not present

## 2022-03-08 ENCOUNTER — Ambulatory Visit: Payer: Self-pay | Admitting: *Deleted

## 2022-03-08 NOTE — Patient Outreach (Signed)
  Care Coordination   03/08/2022 Name: Kimie Pidcock MRN: [id number] DOB: Oct 14, 1955   Care Coordination Outreach Attempts:  An unsuccessful telephone outreach was attempted for a scheduled appointment today.  Follow Up Plan:  Additional outreach attempts will be made to offer the patient care coordination information and services.   Encounter Outcome:  No Answer  Care Coordination Interventions Activated:  No   Care Coordination Interventions:  No, not indicated    Valente David, RN, MSN, [facility] Care Management Care Management Coordinator [phone]

## 2022-03-13 ENCOUNTER — Telehealth: Payer: Self-pay | Admitting: *Deleted

## 2022-03-13 NOTE — Chronic Care Management (AMB) (Signed)
  Care Coordination   Note   03/13/2022 Name: Lilly Gasser MRN: [id number] DOB: [birth year]  Tennyson Kallen is a 66 y.o. year old female who sees Hasanaj, Samul Dada, MD for primary care. I reached out to [name] by phone today to reschedule follow up with [facility]  care coordination services.   Follow up plan:  Unsuccessful telephone outreach attempt made. A HIPAA compliant phone message was left for the patient providing contact information and requesting a return call.  Encounter Outcome:  No Answer  Minor  Direct Dial: [phone]

## 2022-03-26 ENCOUNTER — Telehealth: Payer: Self-pay | Admitting: *Deleted

## 2022-03-26 NOTE — Chronic Care Management (AMB) (Signed)
  Care Coordination   Note   03/26/2022 Name: Christine Garrett MRN: [id number] DOB: 05/14/1956  Christine Garrett is a 66 y.o. year old female who sees Hasanaj, Samul Dada, MD for primary care. I reached out to [name] by phone today to reschedule care coordination services.   Follow up plan:  Unsuccessful telephone outreach attempt made. A HIPAA compliant phone message was left for the patient providing contact information and requesting a return call.  Encounter Outcome:  No Answer  [place]  Direct Dial: [phone]

## 2022-04-01 NOTE — Chronic Care Management (AMB) (Signed)
  Care Coordination  Outreach Note  04/01/2022 Name: Christine Garrett MRN: [id number] DOB: 1955-08-20   Care Coordination Outreach Attempts: A third unsuccessful outreach was attempted today to offer the patient with information about available care coordination services as a benefit of their health plan.   Follow Up Plan:  No further outreach attempts will be made at this time. We have been unable to contact the patient to offer or enroll patient in care coordination services  Encounter Outcome:  No Answer  [place]: [phone]

## 2022-04-16 ENCOUNTER — Inpatient Hospital Stay: Payer: Medicare HMO

## 2022-04-16 ENCOUNTER — Inpatient Hospital Stay (HOSPITAL_BASED_OUTPATIENT_CLINIC_OR_DEPARTMENT_OTHER): Payer: Medicare HMO | Admitting: Hematology

## 2022-04-16 ENCOUNTER — Other Ambulatory Visit: Payer: Self-pay

## 2022-04-16 ENCOUNTER — Inpatient Hospital Stay: Payer: Medicare HMO | Attending: Hematology

## 2022-04-16 VITALS — BP 180/78 | HR 87 | Temp 97.7°F | Resp 18 | Ht 69.0 in | Wt 225.0 lb

## 2022-04-16 DIAGNOSIS — Z5112 Encounter for antineoplastic immunotherapy: Secondary | ICD-10-CM | POA: Diagnosis present

## 2022-04-16 DIAGNOSIS — Z86718 Personal history of other venous thrombosis and embolism: Secondary | ICD-10-CM | POA: Diagnosis not present

## 2022-04-16 DIAGNOSIS — Z95828 Presence of other vascular implants and grafts: Secondary | ICD-10-CM

## 2022-04-16 DIAGNOSIS — G629 Polyneuropathy, unspecified: Secondary | ICD-10-CM | POA: Insufficient documentation

## 2022-04-16 DIAGNOSIS — C9001 Multiple myeloma in remission: Secondary | ICD-10-CM

## 2022-04-16 LAB — CBC WITH DIFFERENTIAL (CANCER CENTER ONLY)
Abs Immature Granulocytes: 0.02 10*3/uL (ref 0.00–0.07)
Basophils Absolute: 0.1 10*3/uL (ref 0.0–0.1)
Basophils Relative: 1 %
Eosinophils Absolute: 0.2 10*3/uL (ref 0.0–0.5)
Eosinophils Relative: 3 %
HCT: 36.4 % (ref 36.0–46.0)
Hemoglobin: 12.6 g/dL (ref 12.0–15.0)
Immature Granulocytes: 0 %
Lymphocytes Relative: 33 %
Lymphs Abs: 2.2 10*3/uL (ref 0.7–4.0)
MCH: 33.1 pg (ref 26.0–34.0)
MCHC: 34.6 g/dL (ref 30.0–36.0)
MCV: 95.5 fL (ref 80.0–100.0)
Monocytes Absolute: 0.4 10*3/uL (ref 0.1–1.0)
Monocytes Relative: 6 %
Neutro Abs: 3.8 10*3/uL (ref 1.7–7.7)
Neutrophils Relative %: 57 %
Platelet Count: 260 10*3/uL (ref 150–400)
RBC: 3.81 MIL/uL — ABNORMAL LOW (ref 3.87–5.11)
RDW: 13.4 % (ref 11.5–15.5)
WBC Count: 6.7 10*3/uL (ref 4.0–10.5)
nRBC: 0 % (ref 0.0–0.2)

## 2022-04-16 LAB — CMP (CANCER CENTER ONLY)
ALT: 21 U/L (ref 0–44)
AST: 27 U/L (ref 15–41)
Albumin: 4.2 g/dL (ref 3.5–5.0)
Alkaline Phosphatase: 74 U/L (ref 38–126)
Anion gap: 9 (ref 5–15)
BUN: 11 mg/dL (ref 8–23)
CO2: 23 mmol/L (ref 22–32)
Calcium: 9.8 mg/dL (ref 8.9–10.3)
Chloride: 110 mmol/L (ref 98–111)
Creatinine: 1.16 mg/dL — ABNORMAL HIGH (ref 0.44–1.00)
GFR, Estimated: 53 mL/min — ABNORMAL LOW (ref >60)
Glucose, Bld: 111 mg/dL — ABNORMAL HIGH (ref 70–99)
Potassium: 4 mmol/L (ref 3.5–5.1)
Sodium: 142 mmol/L (ref 135–145)
Total Bilirubin: 0.5 mg/dL (ref 0.3–1.2)
Total Protein: 7.8 g/dL (ref 6.5–8.1)

## 2022-04-16 LAB — VITAMIN D 25 HYDROXY (VIT D DEFICIENCY, FRACTURES): Vit D, 25-Hydroxy: 54.02 ng/mL (ref 30–100)

## 2022-04-16 MED ORDER — SODIUM CHLORIDE 0.9 % IV SOLN: Freq: Once | INTRAVENOUS | Status: AC

## 2022-04-16 MED ORDER — SODIUM CHLORIDE 0.9% FLUSH
10.0000 mL | Freq: Once | INTRAVENOUS | Status: AC
  Administered 2022-04-16: 10 mL

## 2022-04-16 MED ORDER — ZOLEDRONIC ACID 4 MG/100ML IV SOLN
4.0000 mg | Freq: Once | INTRAVENOUS | Status: AC
  Administered 2022-04-16: 4 mg via INTRAVENOUS
  Filled 2022-04-16: qty 100

## 2022-04-16 MED ORDER — HEPARIN SOD (PORK) LOCK FLUSH 100 UNIT/ML IV SOLN
500.0000 [IU] | Freq: Once | INTRAVENOUS | Status: AC
  Administered 2022-04-16: 500 [IU]

## 2022-04-16 MED ORDER — SODIUM CHLORIDE 0.9% FLUSH: 10.0000 mL | Freq: Once | INTRAVENOUS | Status: DC

## 2022-04-16 NOTE — Progress Notes (Signed)
HEMATOLOGY/ONCOLOGY CLINIC NOTE  Date of Service: 04/16/2022    Patient Care Team: Neale Burly, MD as PCP - General (Internal Medicine) Alda Berthold, DO as Consulting Physician (Neurology) Valente David, RN as [facility]  CHIEF COMPLAINTS/PURPOSE OF CONSULTATION:   Follow-up for continued evaluation and management of multiple myeloma  Oncologic History:  66 y.o. female who initially presented with significant weight loss in the context of H. pylori-associated gastritis and alcohol abuse. At the same time, an incidental discovery of a lower thoracic vertebral mass in the context of chronic numbness and weakness in the lower extremities. MRI of the thoracic spine demonstrated a destructive lesion at T8 level and patient underwent surgical treatment. 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 Staging evaluation conducted by our clinic confirmed presence of IgG kappa multiple myeloma. Cytogenetics studies demonstrated normal karyotype, FISH studies are positive for 13q34 & D13S319 loss and no loss of p53. Based on initial information, patient was staged at R-ISS II.    Patient has completed 6 cycles of induction systemic chemotherapy with lenalidomide, bortezomib, and low-dose dexamethasone.  Repeat bone marrow biopsy demonstrated excellent response to treatment and patient subsequently underwent consolidation therapy including melphalan conditioning and autologous stem cell rescue.  Repeat assessment with PET/CT and bone marrow biopsy on 10/13/2017 demonstrates stringent complete response maintenance.  HISTORY OF PRESENTING ILLNESS:  Please see previous note for details on initial presentation  INTERVAL HISTORY:   Christine Garrett is a 66 y.o. female here for continued evaluation and management of her multiple myeloma. Last clinic visit with [place] was about 3 months ago. She reports She is doing well with no new symptoms or  concerns.  Continues to have symptoms related to her chronic neuropathy. She notes her neuropathy has worsened somewhat.  No new dental issues. No new focal bone pains. No fever, chills, night sweats. No new lumps, bumps, or lesions/rashes. No new or unexpected weight loss. No new leg swelling. No other new or acute focal symptoms.  Labs done today were discussed in details.  MEDICAL HISTORY:  Past Medical History:  Diagnosis Date   Arthritis    knees   Cancer (HCC)    Carpal tunnel syndrome    CHF (congestive heart failure) (HCC)    Diabetes mellitus    type 2   GERD (gastroesophageal reflux disease)    Heart murmur    "Little, No concerns" per Dr  Dannielle Burn pt reported.   Hypertension    controlled using a guided approch with plasma renin activity   Multiple myeloma not having achieved remission ([place]) 01/06/2017   Neuropathy    Peripheral vascular disease ([place])    Sleep apnea    01/03/2020: per patient, hasn't used CPAP machine in two years due to inability to breathe well with it on    SURGICAL HISTORY: Past Surgical History:  Procedure Laterality Date   BIOPSY  11/28/2016   Procedure: BIOPSY;  Surgeon: Rogene Houston, MD;  [facility];  Service: Endoscopy;;  gastric   BIOPSY  07/02/2019   Procedure: BIOPSY;  Surgeon: Rogene Houston, MD;  [facility];  Service: Endoscopy;;   BTL     1982   CERVICAL CONE BIOPSY     cervical lesion   COLONOSCOPY WITH PROPOFOL N/A 07/02/2019   Procedure: COLONOSCOPY WITH PROPOFOL;  Surgeon: Rogene Houston, MD;  [facility];  Service: Endoscopy;  Laterality: N/A;  12:40 - pt  can't come earlier due to transportation   ESOPHAGOGASTRODUODENOSCOPY (EGD) WITH PROPOFOL N/A 11/28/2016   Procedure: ESOPHAGOGASTRODUODENOSCOPY (EGD) WITH PROPOFOL;  Surgeon: Rogene Houston, MD;  [facility];  Service: Endoscopy;  Laterality: N/A;  9:25   IR FLUORO GUIDE PORT INSERTION RIGHT  01/29/2017   IR US  GUIDE VASC ACCESS RIGHT  01/29/2017   lipoma removal     right shoulder 2001   LUMBAR FUSION  01/05/2020    Lumbar Two-Three Lumbar Three-Four Lumbar Four-Five Posterior lumbar interbody fusion with decompression (N/A Spine Lumbar)   MULTIPLE EXTRACTIONS WITH ALVEOLOPLASTY Bilateral 08/24/2012   Procedure: MULTIPLE EXTRACTION WITH ALVEOLOPLASTY BIOPSY OF RIGHT AND LEFT MANDIBLE ;  Surgeon: Gae Bon, DDS;  Location: [facility];  Service: Oral Surgery;  Laterality: Bilateral;   POLYPECTOMY  07/02/2019   Procedure: POLYPECTOMY;  Surgeon: Rogene Houston, MD;  [facility];  Service: Endoscopy;;   POSTERIOR LUMBAR FUSION 4 LEVEL N/A 12/26/2016   Procedure: THORACIC EIGHT TUMOR RESECTION, THORACIC SIX- THORACIC TEN POSTERIOR SPINAL FUSION;  Surgeon: Ashok Pall, MD;  Location: [facility];  Service: Neurosurgery;  Laterality: N/A;  THORACIC 8 TUMOR RESECTION, THORACIC 6- THORACIC 10 POSTERIOR SPINAL FUSION   ROTATOR CUFF REPAIR     right shoulder   TUBAL LIGATION      SOCIAL HISTORY: Social History   Socioeconomic History   Marital status: Single    Spouse name: Not on file   Number of children: 3   Years of education: Not on file   Highest education level: Not on file  Occupational History   Occupation: on disability   Occupation: former CNA  Tobacco Use   Smoking status: Former    Packs/day: 0.50    Years: 6.00    Total pack years: 3.00    Types: Cigarettes    Quit date: 2019    Years since quitting: 4.8   Smokeless tobacco: Never  Vaping Use   Vaping Use: Never used  Substance and Sexual Activity   Alcohol use: No    Alcohol/week: 6.0 standard drinks of alcohol    Types: 6 Cans of beer per week   Drug use: No   Sexual activity: Not Currently    Birth control/protection: Post-menopausal  Other Topics Concern   Not on file  Social History Narrative   Lives with son, daughter and grandkids in a 2 story home but stays on the first floor.  Has 3 children.  On disability  since ~ 2006 for back issues but did work as a [employer]ar[employer] manager.      Right handed   Social Determinants of Health   Financial Resource Strain: Not on file  Food Insecurity: No Food Insecurity (01/30/2022)   Hunger Vital Sign    Worried About Running Out of Food in the Last Year: Never true    Ran Out of Food in the Last Year: Never true  Transportation Needs: No Transportation Needs (01/30/2022)   PRAPARE - Hydrologist (Medical): No    Lack of Transportation (Non-Medical): No  Physical Activity: Not on file  Stress: Not on file  Social Connections: Not on file  Intimate Partner Violence: Not on file    FAMILY HISTORY: Family History  Problem Relation Age of Onset   Diabetes Mother    Hypertension Mother    Heart disease Mother    Alzheimer's disease Mother    Diabetes Father    Heart disease Sister  Diabetes Sister     ALLERGIES:  is allergic to hydrocodone.  MEDICATIONS:  Current Outpatient Medications  Medication Sig Dispense Refill   amLODipine (NORVASC) 5 MG tablet Take 5 mg by mouth daily.     apixaban (ELIQUIS) 5 MG TABS tablet Take 1 tablet (5 mg total) by mouth 2 (two) times daily.     atorvastatin (LIPITOR) 40 MG tablet Take 40 mg by mouth daily.     enalapril (VASOTEC) 20 MG tablet Take 20 mg by mouth 2 (two) times daily.      gabapentin (NEURONTIN) 300 MG capsule TAKE 2 CAPSULES (600 MG TOTAL) BY MOUTH 3 (THREE) TIMES DAILY. 540 capsule 0   hydrALAZINE (APRESOLINE) 25 MG tablet Take 25 mg by mouth 2 (two) times daily.     insulin degludec (TRESIBA FLEXTOUCH) 100 UNIT/ML SOPN FlexTouch Pen Inject 0.5 mLs (50 Units total) into the skin daily at 10 pm. (Patient taking differently: Inject 40 Units into the skin daily at 10 pm.) 5 pen 2   Insulin Pen Needle (B-D ULTRAFINE III SHORT PEN) 31G X 8 MM MISC 1 each by Does not apply route as directed. 100 each 3   metFORMIN (GLUCOPHAGE) 1000 MG tablet Take 1,000 mg by mouth 2 (two) times daily.       metoprolol succinate (TOPROL-XL) 50 MG 24 hr tablet Take 50 mg by mouth daily. Take with or immediately following a meal.     Omega-3 Fatty Acids (FISH OIL) 1000 MG CPDR Take 1,000 mg by mouth daily. Omega 3 300 mg     potassium chloride (MICRO-K) 10 MEQ CR capsule Take 10 mEq by mouth daily.     Vitamin D, Ergocalciferol, (DRISDOL) 50000 units CAPS capsule Take 50,000 Units by mouth every Monday.      No current facility-administered medications for this visit.    REVIEW OF SYSTEMS:   10 Point review of Systems was done is negative except as noted above.  PHYSICAL EXAMINATION: .BP (!) 180/78 (BP Location: Left Arm, Patient Position: Sitting) Comment: nurse notified  Pulse 87   Temp 97.7 F (36.5 C) (Temporal)   Resp 18   Ht _0  (1.753 m)   Wt 225 lb (102.1 kg)   SpO2 100%   BMI 33.23 kg/m  NAD GENERAL:alert, in no acute distress and comfortable SKIN: no acute rashes, no significant lesions EYES: conjunctiva are pink and non-injected, sclera anicteric NECK: supple, no JVD LYMPH:  no palpable lymphadenopathy in the cervical, axillary or inguinal regions LUNGS: clear to auscultation b/l with normal respiratory effort HEART: regular rate & rhythm ABDOMEN:  normoactive bowel sounds , non tender, not distended. Extremity: no pedal edema PSYCH: alert & oriented x 3 with fluent speech NEURO: no focal motor/sensory deficits  LABORATORY DATA:  I have reviewed the data as listed  .    Latest Ref Rng & Units 04/16/2022    1:02 PM 01/22/2022   10:26 AM 10/19/2021   11:35 AM  CBC  WBC 4.0 - 10.5 K/uL 6.7  6.4  6.2   Hemoglobin 12.0 - 15.0 g/dL 12.6  12.7  12.1   Hematocrit 36.0 - 46.0 % 36.4  37.5  35.9   Platelets 150 - 400 K/uL 260  258  255     .    Latest Ref Rng & Units 04/16/2022    1:02 PM 01/22/2022   10:26 AM 10/19/2021   11:40 AM  CMP  Glucose 70 - 99 mg/dL 111  127  109  BUN 8 - 23 mg/dL _0 Creatinine 0.44 - 1.00 mg/dL 1.16  1.03  1.05   Sodium 135 -  145 mmol/L 142  141  142   Potassium 3.5 - 5.1 mmol/L 4.0  4.1  4.4   Chloride 98 - 111 mmol/L 110  111  109   CO2 22 - 32 mmol/L _1 Calcium 8.9 - 10.3 mg/dL 9.8  9.7  10.0   Total Protein 6.5 - 8.1 g/dL 7.8  7.5  7.3   Total Bilirubin 0.3 - 1.2 mg/dL 0.5  0.6  0.4   Alkaline Phos 38 - 126 U/L 74  71  74   AST 15 - 41 U/L _2 ALT 0 - 44 U/L _3 03/25/18 SPEP and SFLC:     04/21/17 Cytogenetics:    01/17/17 Cytogenetics:    RADIOGRAPHIC STUDIES: I have personally reviewed the radiological images as listed and agreed with the findings in the report. No results found.  10/13/17 PET/CT     ASSESSMENT & PLAN:   66 y.o. female with  1. Multiple myeloma Currently in remission Autologous PBSCT on 07/01/17. IgG kappa multiple myeloma. Cytogenetics studies demonstrated normal karyotype, FISH studies are positive for 13q34 & D13S319 loss and no loss of p53. Based on initial information, patient was staged at R-ISS II.  Completed 6 cycles of induction with Revlimid, Velcade and dexamethasone  12/09/2018 lumbar spine xray with results revealing "No acute osseous abnormality. Extensive multilevel degenerative changes are noted throughout the lumbar spine."  12/09/2018 sacrum and coccyx xray with results revealing "No acute osseous abnormality. Degenerative changes are noted of both hips."  2. Grade 2 neuropathy -controlled  02/26/18 NCV with EMG which revealed The electrophysiologic findings are most consistent with a chronic, length-dependent sensorimotor axonal and demyelinating polyneuropathy affecting the right side. Overall, these findings are moderate to severe in degree electrically. Incidentally, there is a right Martin-Gruber anastomosis, a normal variant.   3. Rt LE calf veins - possible clot ?chronic.  01/29/18 VAS [place] BLE which revealed Right: Ultrasound is unable to distinguish whether obstruction in the Peroneal veins, and great saphenous  vein, and superficial veins/varciosities is acute or chronic. No cystic structure found in the popliteal fossa. Left: no evidence of DVT.   PLAN: -Patient has no symptoms suggestive of myeloma progression at this time. -Labs done today show stable CBC and CMP -Myeloma panel no M spike -Patient will continue her maintenance Zometa every 3 months.  Patient notes no new dental issues. -No indication for additional myeloma treatment at this time. -Zometa orders placed and signed  FOLLOW UP:  Return to clinic with Dr. Irene Limbo with labs and next dose of Zometa in 3 months  The total time spent in the appointment was 21 minutes*.  All of the patient's questions were answered with apparent satisfaction. The patient knows to call the clinic with any problems, questions or concerns.   Sullivan Lone MD MS AAHIVMS [facility] Hematology/Oncology Physician [facility]  .*Total Encounter Time as defined by the Centers for Medicare and Medicaid Services includes, in addition to the face-to-face time of a patient visit (documented in the note above) non-face-to-face time: obtaining and reviewing outside history, ordering and reviewing medications, tests or procedures, care coordination (communications with other health care professionals or caregivers) and documentation in the medical record.  I, Melene Muller, am acting as scribe for Dr. Sullivan Lone, MD.  .I have reviewed the above documentation for accuracy and completeness, and I agree with the above. Brunetta Genera MD

## 2022-04-16 NOTE — Patient Instructions (Signed)

## 2022-04-19 LAB — MULTIPLE MYELOMA PANEL, SERUM
Albumin SerPl Elph-Mcnc: 3.9 g/dL (ref 2.9–4.4)
Albumin/Glob SerPl: 1.2 (ref 0.7–1.7)
Alpha 1: 0.2 g/dL (ref 0.0–0.4)
Alpha2 Glob SerPl Elph-Mcnc: 1 g/dL (ref 0.4–1.0)
B-Globulin SerPl Elph-Mcnc: 1 g/dL (ref 0.7–1.3)
Gamma Glob SerPl Elph-Mcnc: 1.2 g/dL (ref 0.4–1.8)
Globulin, Total: 3.3 g/dL (ref 2.2–3.9)
IgA: 247 mg/dL (ref 87–352)
IgG (Immunoglobin G), Serum: 1184 mg/dL (ref 586–1602)
IgM (Immunoglobulin M), Srm: 44 mg/dL (ref 26–217)
Total Protein ELP: 7.2 g/dL (ref 6.0–8.5)

## 2022-04-23 ENCOUNTER — Encounter: Payer: Self-pay | Admitting: Hematology

## 2022-04-23 ENCOUNTER — Telehealth: Payer: Self-pay | Admitting: Hematology

## 2022-04-23 NOTE — Telephone Encounter (Signed)
Called patient per 11/6 in basket. Scheduled 3 month follow up/labs/infusion. Patient notified.

## 2022-04-29 DIAGNOSIS — N1832 Chronic kidney disease, stage 3b: Secondary | ICD-10-CM | POA: Diagnosis not present

## 2022-04-29 DIAGNOSIS — Z0000 Encounter for general adult medical examination without abnormal findings: Secondary | ICD-10-CM | POA: Diagnosis not present

## 2022-04-29 DIAGNOSIS — M549 Dorsalgia, unspecified: Secondary | ICD-10-CM | POA: Diagnosis not present

## 2022-04-29 DIAGNOSIS — E7849 Other hyperlipidemia: Secondary | ICD-10-CM | POA: Diagnosis not present

## 2022-04-29 DIAGNOSIS — I10 Essential (primary) hypertension: Secondary | ICD-10-CM | POA: Diagnosis not present

## 2022-04-29 DIAGNOSIS — J449 Chronic obstructive pulmonary disease, unspecified: Secondary | ICD-10-CM | POA: Diagnosis not present

## 2022-04-29 DIAGNOSIS — I5042 Chronic combined systolic (congestive) and diastolic (congestive) heart failure: Secondary | ICD-10-CM | POA: Diagnosis not present

## 2022-04-29 DIAGNOSIS — M545 Low back pain, unspecified: Secondary | ICD-10-CM | POA: Diagnosis not present

## 2022-04-29 DIAGNOSIS — M4317 Spondylolisthesis, lumbosacral region: Secondary | ICD-10-CM | POA: Diagnosis not present

## 2022-04-29 DIAGNOSIS — Z9889 Other specified postprocedural states: Secondary | ICD-10-CM | POA: Diagnosis not present

## 2022-04-29 DIAGNOSIS — M5134 Other intervertebral disc degeneration, thoracic region: Secondary | ICD-10-CM | POA: Diagnosis not present

## 2022-04-29 DIAGNOSIS — J301 Allergic rhinitis due to pollen: Secondary | ICD-10-CM | POA: Diagnosis not present

## 2022-04-29 DIAGNOSIS — Z6834 Body mass index (BMI) 34.0-34.9, adult: Secondary | ICD-10-CM | POA: Diagnosis not present

## 2022-04-29 DIAGNOSIS — E1121 Type 2 diabetes mellitus with diabetic nephropathy: Secondary | ICD-10-CM | POA: Diagnosis not present

## 2022-05-21 DIAGNOSIS — M25551 Pain in right hip: Secondary | ICD-10-CM | POA: Diagnosis not present

## 2022-05-21 DIAGNOSIS — I2699 Other pulmonary embolism without acute cor pulmonale: Secondary | ICD-10-CM | POA: Diagnosis not present

## 2022-05-21 DIAGNOSIS — M4316 Spondylolisthesis, lumbar region: Secondary | ICD-10-CM | POA: Diagnosis not present

## 2022-05-21 DIAGNOSIS — Z6834 Body mass index (BMI) 34.0-34.9, adult: Secondary | ICD-10-CM | POA: Diagnosis not present

## 2022-05-29 ENCOUNTER — Other Ambulatory Visit (HOSPITAL_COMMUNITY): Payer: Self-pay | Admitting: Neurosurgery

## 2022-05-29 DIAGNOSIS — I2699 Other pulmonary embolism without acute cor pulmonale: Secondary | ICD-10-CM | POA: Diagnosis not present

## 2022-05-29 DIAGNOSIS — M4316 Spondylolisthesis, lumbar region: Secondary | ICD-10-CM

## 2022-06-05 ENCOUNTER — Encounter (INDEPENDENT_AMBULATORY_CARE_PROVIDER_SITE_OTHER): Payer: Self-pay | Admitting: *Deleted

## 2022-06-20 ENCOUNTER — Ambulatory Visit (HOSPITAL_COMMUNITY)
Admission: RE | Admit: 2022-06-20 | Discharge: 2022-06-20 | Disposition: A | Discharge status: Home / Self Care | Payer: Medicare HMO | Source: Ambulatory Visit | Attending: Neurosurgery | Admitting: Neurosurgery

## 2022-06-20 DIAGNOSIS — M4316 Spondylolisthesis, lumbar region: Secondary | ICD-10-CM | POA: Diagnosis not present

## 2022-06-20 DIAGNOSIS — M545 Low back pain, unspecified: Secondary | ICD-10-CM | POA: Diagnosis not present

## 2022-06-25 DIAGNOSIS — Z6834 Body mass index (BMI) 34.0-34.9, adult: Secondary | ICD-10-CM | POA: Diagnosis not present

## 2022-06-25 DIAGNOSIS — D492 Neoplasm of unspecified behavior of bone, soft tissue, and skin: Secondary | ICD-10-CM | POA: Diagnosis not present

## 2022-06-28 ENCOUNTER — Other Ambulatory Visit (HOSPITAL_COMMUNITY): Payer: Self-pay | Admitting: Neurosurgery

## 2022-06-28 DIAGNOSIS — D492 Neoplasm of unspecified behavior of bone, soft tissue, and skin: Secondary | ICD-10-CM

## 2022-07-17 ENCOUNTER — Ambulatory Visit (HOSPITAL_COMMUNITY): Admission: RE | Admit: 2022-07-17 | Payer: Medicare HMO | Source: Ambulatory Visit

## 2022-07-18 ENCOUNTER — Ambulatory Visit (HOSPITAL_COMMUNITY)
Admission: RE | Admit: 2022-07-18 | Discharge: 2022-07-18 | Disposition: A | Discharge status: Home / Self Care | Payer: Medicare HMO | Source: Ambulatory Visit | Attending: Neurosurgery | Admitting: Neurosurgery

## 2022-07-18 DIAGNOSIS — M4319 Spondylolisthesis, multiple sites in spine: Secondary | ICD-10-CM | POA: Diagnosis not present

## 2022-07-18 DIAGNOSIS — D492 Neoplasm of unspecified behavior of bone, soft tissue, and skin: Secondary | ICD-10-CM | POA: Insufficient documentation

## 2022-07-18 DIAGNOSIS — M4804 Spinal stenosis, thoracic region: Secondary | ICD-10-CM | POA: Diagnosis not present

## 2022-07-18 DIAGNOSIS — M47814 Spondylosis without myelopathy or radiculopathy, thoracic region: Secondary | ICD-10-CM | POA: Diagnosis not present

## 2022-07-18 MED ORDER — GADOBUTROL 1 MMOL/ML IV SOLN
10.0000 mL | Freq: Once | INTRAVENOUS | Status: AC | PRN
  Administered 2022-07-18: 10 mL via INTRAVENOUS

## 2022-07-22 ENCOUNTER — Other Ambulatory Visit: Payer: Self-pay

## 2022-07-22 DIAGNOSIS — C9001 Multiple myeloma in remission: Secondary | ICD-10-CM

## 2022-07-23 DIAGNOSIS — I2510 Atherosclerotic heart disease of native coronary artery without angina pectoris: Secondary | ICD-10-CM | POA: Diagnosis not present

## 2022-07-23 DIAGNOSIS — E7800 Pure hypercholesterolemia, unspecified: Secondary | ICD-10-CM | POA: Diagnosis not present

## 2022-07-23 DIAGNOSIS — I5020 Unspecified systolic (congestive) heart failure: Secondary | ICD-10-CM | POA: Diagnosis not present

## 2022-07-23 DIAGNOSIS — I10 Essential (primary) hypertension: Secondary | ICD-10-CM | POA: Diagnosis not present

## 2022-07-24 ENCOUNTER — Inpatient Hospital Stay (HOSPITAL_BASED_OUTPATIENT_CLINIC_OR_DEPARTMENT_OTHER): Payer: Medicare HMO | Admitting: Hematology

## 2022-07-24 ENCOUNTER — Inpatient Hospital Stay: Payer: Medicare HMO

## 2022-07-24 ENCOUNTER — Inpatient Hospital Stay: Payer: Medicare HMO | Attending: Hematology

## 2022-07-24 ENCOUNTER — Other Ambulatory Visit: Payer: Self-pay

## 2022-07-24 VITALS — BP 178/77 | HR 75 | Temp 97.3°F | Resp 20 | Wt 228.5 lb

## 2022-07-24 DIAGNOSIS — Z95828 Presence of other vascular implants and grafts: Secondary | ICD-10-CM

## 2022-07-24 DIAGNOSIS — C9001 Multiple myeloma in remission: Secondary | ICD-10-CM

## 2022-07-24 DIAGNOSIS — Z7983 Long term (current) use of bisphosphonates: Secondary | ICD-10-CM | POA: Diagnosis not present

## 2022-07-24 LAB — CBC WITH DIFFERENTIAL (CANCER CENTER ONLY)
Abs Immature Granulocytes: 0.02 10*3/uL (ref 0.00–0.07)
Basophils Absolute: 0.1 10*3/uL (ref 0.0–0.1)
Basophils Relative: 1 %
Eosinophils Absolute: 0.1 10*3/uL (ref 0.0–0.5)
Eosinophils Relative: 2 %
HCT: 34.6 % — ABNORMAL LOW (ref 36.0–46.0)
Hemoglobin: 11.8 g/dL — ABNORMAL LOW (ref 12.0–15.0)
Immature Granulocytes: 0 %
Lymphocytes Relative: 34 %
Lymphs Abs: 2 10*3/uL (ref 0.7–4.0)
MCH: 33.3 pg (ref 26.0–34.0)
MCHC: 34.1 g/dL (ref 30.0–36.0)
MCV: 97.7 fL (ref 80.0–100.0)
Monocytes Absolute: 0.4 10*3/uL (ref 0.1–1.0)
Monocytes Relative: 7 %
Neutro Abs: 3.3 10*3/uL (ref 1.7–7.7)
Neutrophils Relative %: 56 %
Platelet Count: 240 10*3/uL (ref 150–400)
RBC: 3.54 MIL/uL — ABNORMAL LOW (ref 3.87–5.11)
RDW: 13.6 % (ref 11.5–15.5)
WBC Count: 5.9 10*3/uL (ref 4.0–10.5)
nRBC: 0 % (ref 0.0–0.2)

## 2022-07-24 LAB — CMP (CANCER CENTER ONLY)
ALT: 17 U/L (ref 0–44)
AST: 19 U/L (ref 15–41)
Albumin: 4 g/dL (ref 3.5–5.0)
Alkaline Phosphatase: 52 U/L (ref 38–126)
Anion gap: 7 (ref 5–15)
BUN: 11 mg/dL (ref 8–23)
CO2: 24 mmol/L (ref 22–32)
Calcium: 10.1 mg/dL (ref 8.9–10.3)
Chloride: 109 mmol/L (ref 98–111)
Creatinine: 0.95 mg/dL (ref 0.44–1.00)
GFR, Estimated: >60 mL/min (ref >60)
Glucose, Bld: 103 mg/dL — ABNORMAL HIGH (ref 70–99)
Potassium: 4.2 mmol/L (ref 3.5–5.1)
Sodium: 140 mmol/L (ref 135–145)
Total Bilirubin: 0.5 mg/dL (ref 0.3–1.2)
Total Protein: 6.7 g/dL (ref 6.5–8.1)

## 2022-07-24 LAB — CEA (ACCESS): CEA (CHCC): 4.25 ng/mL (ref 0.00–5.00)

## 2022-07-24 MED ORDER — SODIUM CHLORIDE 0.9% FLUSH
10.0000 mL | Freq: Once | INTRAVENOUS | Status: AC
  Administered 2022-07-24: 10 mL

## 2022-07-24 MED ORDER — ZOLEDRONIC ACID 4 MG/100ML IV SOLN
4.0000 mg | Freq: Once | INTRAVENOUS | Status: AC
  Administered 2022-07-24: 4 mg via INTRAVENOUS
  Filled 2022-07-24: qty 100

## 2022-07-24 MED ORDER — SODIUM CHLORIDE 0.9 % IV SOLN: Freq: Once | INTRAVENOUS | Status: AC

## 2022-07-24 MED ORDER — HEPARIN SOD (PORK) LOCK FLUSH 100 UNIT/ML IV SOLN
500.0000 [IU] | Freq: Once | INTRAVENOUS | Status: AC
  Administered 2022-07-24: 500 [IU]

## 2022-07-24 NOTE — Progress Notes (Signed)
HEMATOLOGY/ONCOLOGY CLINIC NOTE  Date of Service: 07/24/22    Patient Care Team: Neale Burly, MD as PCP - General (Internal Medicine) Alda Berthold, DO as Consulting Physician (Neurology) Valente David, RN as [facility]  CHIEF COMPLAINTS/PURPOSE OF CONSULTATION:   Follow-up for continued evaluation and management of multiple myeloma  Oncologic History:  67 y.o. female who initially presented with significant weight loss in the context of H. pylori-associated gastritis and alcohol abuse. At the same time, an incidental discovery of a lower thoracic vertebral mass in the context of chronic numbness and weakness in the lower extremities. MRI of the thoracic spine demonstrated a destructive lesion at T8 level and patient underwent surgical treatment. 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 Staging evaluation conducted by our clinic confirmed presence of IgG kappa multiple myeloma. Cytogenetics studies demonstrated normal karyotype, FISH studies are positive for 13q34 & D13S319 loss and no loss of p53. Based on initial information, patient was staged at [facility].    Patient has completed 6 cycles of induction systemic chemotherapy with lenalidomide, bortezomib, and low-dose dexamethasone.  Repeat bone marrow biopsy demonstrated excellent response to treatment and patient subsequently underwent consolidation therapy including melphalan conditioning and autologous stem cell rescue.  Repeat assessment with PET/CT and bone marrow biopsy on 10/13/2017 demonstrates stringent complete response maintenance.  HISTORY OF PRESENTING ILLNESS:  Please see previous note for details on initial presentation  INTERVAL HISTORY:   Christine Garrett is a 67 y.o. female here for continued evaluation and management of her multiple myeloma.   Patient was last seen by me on 04/16/2022 and she was doing well overall.   Patient reports she has  been doing well overall without any new medical concerns since our last visit. She does complains of consistent chronic neuropathy. She also complains of back pain, which she is getting evaluated with orthopedic physician. Patient notes she has an upcoming appointment with Gastrologist regarding excessive diarrhea.   Patient notes his diabetes is well-controlled with Metformin. She notes her blood pressure is not well-controlled, so her cardiologist changed her medication. She is unsure of the new blood pressure medication name.   She has been tolerating her Zometa infusion well without any toxicities. She regularly takes Vitamin-D supplement.   She denies fever, chills, night sweats, fatigue, abdominal pain, chest pain, bone pain, or leg swelling.   MEDICAL HISTORY:  Past Medical History:  Diagnosis Date   Arthritis    knees   Cancer (HCC)    Carpal tunnel syndrome    CHF (congestive heart failure) (HCC)    Diabetes mellitus    type 2   GERD (gastroesophageal reflux disease)    Heart murmur    "Little, No concerns" per Dr  Dannielle Burn pt reported.   Hypertension    controlled using a guided approch with plasma renin activity   Multiple myeloma not having achieved remission ([place]) 01/06/2017   Neuropathy    Peripheral vascular disease ([place])    Sleep apnea    01/03/2020: per patient, hasn't used CPAP machine in two years due to inability to breathe well with it on    SURGICAL HISTORY: Past Surgical History:  Procedure Laterality Date   BIOPSY  11/28/2016   Procedure: BIOPSY;  Surgeon: Rogene Houston, MD;  [facility];  Service: Endoscopy;;  gastric   BIOPSY  07/02/2019   Procedure: BIOPSY;  Surgeon: Rogene Houston, MD;  [facility];  Service:  Endoscopy;;   BTL     1982   CERVICAL CONE BIOPSY     cervical lesion   COLONOSCOPY WITH PROPOFOL N/A 07/02/2019   Procedure: COLONOSCOPY WITH PROPOFOL;  Surgeon: Rogene Houston, MD;  [facility];  Service:  Endoscopy;  Laterality: N/A;  12:40 - pt can't come earlier due to transportation   ESOPHAGOGASTRODUODENOSCOPY (EGD) WITH PROPOFOL N/A 11/28/2016   Procedure: ESOPHAGOGASTRODUODENOSCOPY (EGD) WITH PROPOFOL;  Surgeon: Rogene Houston, MD;  [facility];  Service: Endoscopy;  Laterality: N/A;  9:25   IR FLUORO GUIDE PORT INSERTION RIGHT  01/29/2017   IR US GUIDE VASC ACCESS RIGHT  01/29/2017   lipoma removal     right shoulder 2001   LUMBAR FUSION  01/05/2020    Lumbar Two-Three Lumbar Three-Four Lumbar Four-Five Posterior lumbar interbody fusion with decompression (N/A Spine Lumbar)   MULTIPLE EXTRACTIONS WITH ALVEOLOPLASTY Bilateral 08/24/2012   Procedure: MULTIPLE EXTRACTION WITH ALVEOLOPLASTY BIOPSY OF RIGHT AND LEFT MANDIBLE ;  Surgeon: Gae Bon, DDS;  Location: [facility];  Service: Oral Surgery;  Laterality: Bilateral;   POLYPECTOMY  07/02/2019   Procedure: POLYPECTOMY;  Surgeon: Rogene Houston, MD;  [facility];  Service: Endoscopy;;   POSTERIOR LUMBAR FUSION 4 LEVEL N/A 12/26/2016   Procedure: THORACIC EIGHT TUMOR RESECTION, THORACIC SIX- THORACIC TEN POSTERIOR SPINAL FUSION;  Surgeon: Ashok Pall, MD;  Location: [facility];  Service: Neurosurgery;  Laterality: N/A;  THORACIC 8 TUMOR RESECTION, THORACIC 6- THORACIC 10 POSTERIOR SPINAL FUSION   ROTATOR CUFF REPAIR     right shoulder   TUBAL LIGATION      SOCIAL HISTORY: Social History   Socioeconomic History   Marital status: Single    Spouse name: Not on file   Number of children: 3   Years of education: Not on file   Highest education level: Not on file  Occupational History   Occupation: on disability   Occupation: former CNA  Tobacco Use   Smoking status: Former    Packs/day: 0.50    Years: 6.00    Total pack years: 3.00    Types: Cigarettes    Quit date: 2019    Years since quitting: 5.1   Smokeless tobacco: Never  Vaping Use   Vaping Use: Never used  Substance and Sexual Activity   Alcohol  use: No    Alcohol/week: 6.0 standard drinks of alcohol    Types: 6 Cans of beer per week   Drug use: No   Sexual activity: Not Currently    Birth control/protection: Post-menopausal  Other Topics Concern   Not on file  Social History Narrative   Lives with son, daughter and grandkids in a 2 story home but stays on the first floor.  Has 3 children.  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      Right handed   Social Determinants of Health   Financial Resource Strain: Not on file  Food Insecurity: No Food Insecurity (01/30/2022)   Hunger Vital Sign    Worried About Running Out of Food in the Last Year: Never true    Ran Out of Food in the Last Year: Never true  Transportation Needs: No Transportation Needs (01/30/2022)   PRAPARE - Hydrologist (Medical): No    Lack of Transportation (Non-Medical): No  Physical Activity: Not on file  Stress: Not on file  Social Connections: Not on file  Intimate Partner Violence: Not on file    FAMILY HISTORY: Family History  Problem Relation Age of Onset   Diabetes Mother    Hypertension Mother    Heart disease Mother    Alzheimer's disease Mother    Diabetes Father    Heart disease Sister    Diabetes Sister     ALLERGIES:  is allergic to hydrocodone.  MEDICATIONS:  Current Outpatient Medications  Medication Sig Dispense Refill   amLODipine (NORVASC) 5 MG tablet Take 5 mg by mouth daily.     apixaban (ELIQUIS) 5 MG TABS tablet Take 1 tablet (5 mg total) by mouth 2 (two) times daily.     atorvastatin (LIPITOR) 40 MG tablet Take 40 mg by mouth daily.     enalapril (VASOTEC) 20 MG tablet Take 20 mg by mouth 2 (two) times daily.      gabapentin (NEURONTIN) 300 MG capsule TAKE 2 CAPSULES (600 MG TOTAL) BY MOUTH 3 (THREE) TIMES DAILY. 540 capsule 0   hydrALAZINE (APRESOLINE) 25 MG tablet Take 25 mg by mouth 2 (two) times daily.     insulin degludec (TRESIBA FLEXTOUCH) 100 UNIT/ML SOPN  FlexTouch Pen Inject 0.5 mLs (50 Units total) into the skin daily at 10 pm. (Patient taking differently: Inject 40 Units into the skin daily at 10 pm.) 5 pen 2   Insulin Pen Needle (B-D ULTRAFINE III SHORT PEN) 31G X 8 MM MISC 1 each by Does not apply route as directed. 100 each 3   metFORMIN (GLUCOPHAGE) 1000 MG tablet Take 1,000 mg by mouth 2 (two) times daily.      metoprolol succinate (TOPROL-XL) 50 MG 24 hr tablet Take 50 mg by mouth daily. Take with or immediately following a meal.     Omega-3 Fatty Acids (FISH OIL) 1000 MG CPDR Take 1,000 mg by mouth daily. Omega 3 300 mg     potassium chloride (MICRO-K) 10 MEQ CR capsule Take 10 mEq by mouth daily.     Vitamin D, Ergocalciferol, (DRISDOL) 50000 units CAPS capsule Take 50,000 Units by mouth every Monday.      No current facility-administered medications for this visit.    REVIEW OF SYSTEMS:   10 Point review of Systems was done is negative except as noted above.  PHYSICAL EXAMINATION: .BP (!) 178/77 Comment: RN Beth W made aware  Pulse 75   Temp (!) 97.3 F (36.3 C)   Resp 20   Wt 228 lb 8 oz (103.6 kg)   SpO2 100%   BMI 33.74 kg/m  NAD GENERAL:alert, in no acute distress and comfortable SKIN: no acute rashes, no significant lesions EYES: conjunctiva are pink and non-injected, sclera anicteric NECK: supple, no JVD LYMPH:  no palpable lymphadenopathy in the cervical, axillary or inguinal regions LUNGS: clear to auscultation b/l with normal respiratory effort HEART: regular rate & rhythm ABDOMEN:  normoactive bowel sounds , non tender, not distended. Extremity: no pedal edema PSYCH: alert & oriented x 3 with fluent speech NEURO: no focal motor/sensory deficits  LABORATORY DATA:  I have reviewed the data as listed  .    Latest Ref Rng & Units 07/24/2022   11:21 AM 04/16/2022    1:02 PM 01/22/2022   10:26 AM  CBC  WBC 4.0 - 10.5 K/uL 5.9  6.7  6.4   Hemoglobin 12.0 - 15.0 g/dL 11.8  12.6  12.7   Hematocrit 36.0 - 46.0  % 34.6  36.4  37.5   Platelets 150 - 400 K/uL 240  260  258     .    Latest Ref Rng & Units 07/24/2022   11:21 AM 04/16/2022    1:02 PM 01/22/2022   10:26 AM  CMP  Glucose 70 - 99 mg/dL 103  111  127   BUN 8 - 23 mg/dL 11  11  14   $ Creatinine 0.44 - 1.00 mg/dL 0.95  1.16  1.03   Sodium 135 - 145 mmol/L 140  142  141   Potassium 3.5 - 5.1 mmol/L 4.2  4.0  4.1   Chloride 98 - 111 mmol/L 109  110  111   CO2 22 - 32 mmol/L 24  23  26   $ Calcium 8.9 - 10.3 mg/dL 10.1  9.8  9.7   Total Protein 6.5 - 8.1 g/dL 6.7  7.8  7.5   Total Bilirubin 0.3 - 1.2 mg/dL 0.5  0.5  0.6   Alkaline Phos 38 - 126 U/L 52  74  71   AST 15 - 41 U/L 19  27  21   $ ALT 0 - 44 U/L 17  21  19       $ 03/25/18 SPEP and SFLC:     04/21/17 Cytogenetics:    01/17/17 Cytogenetics:    RADIOGRAPHIC STUDIES: I have personally reviewed the radiological images as listed and agreed with the findings in the report. MR THORACIC SPINE W WO CONTRAST  Result Date: 07/18/2022 CLINICAL DATA:  Neoplasm of unspecified behavior of bone, soft tissue, and skin D49.2 (ICD-10-CM). EXAM: MRI THORACIC WITHOUT AND WITH CONTRAST TECHNIQUE: Multiplanar and multiecho pulse sequences of the thoracic spine were obtained without and with intravenous contrast. CONTRAST:  59m GADAVIST GADOBUTROL 1 MMOL/ML IV SOLN COMPARISON:  MRI thoracic spine December 17, 2016. FINDINGS: Alignment: Anterolisthesis at C7-T1. Small retrolisthesis at T7-8, T10-11 and T11-12. Vertebrae: Postsurgical changes from T9 left posterior elements tumor resection and posterior transpedicular fixation from T7 through T11 with pedicle screws at T7, T8, T10 and T11. No evidence of recurrent tumor identified. Cord: Mass effect on the cord at the T6-7 level without cord signal abnormality. Paraspinal and other soft tissues: Small incidental thyroid nodules measuring up to 1.2 cm. No follow-up imaging is recommended. Reference: J Am Coll Radiol. 2015 Feb;12(2): 143-50 Disc levels: C7-T1:  Small posterior disc protrusion without significant spinal canal stenosis. Facet degenerative changes resulting in mild bilateral neural foraminal narrowing. T1-2: Posterior disc osteophyte complex resulting in mild spinal canal stenosis and mild bilateral neural foraminal narrowing. T2-3:Small posterior disc protrusion and facet degenerative changes without significant spinal canal or neural foraminal stenosis. T3-4:Posterior disc osteophyte complex resulting in mild spinal canal stenosis. No significant neural foraminal narrowing. T4-5:Posterior disc osteophyte complex asymmetric to the right and mild facet degenerative changes resulting mild spinal canal stenosis. No neural foraminal narrowing. T5-6:Facet degenerative changes. No spinal canal or neural foraminal stenosis. T6-7:Calcified right central disc protrusion causing mass effect on the cord and resulting in moderate spinal canal stenosis. Facet degenerative changes. No significant neural foraminal narrowing. T7-8:Small posterior disc protrusion and facet degenerative changes resulting in mild left neural foraminal narrowing. T8-9:T2 hyperintense, T1 intermediate signal tissue is seen in the left lateral and posterior aspect of the thecal sac with mild contrast enhancement, suggestive of postsurgical changes. No spinal canal or neural foraminal stenosis. T9-10:Shallow disc bulge and facet degenerative changes. No spinal canal or neural foraminal stenosis. T10-11:Right posterolateral calcified disc protrusion and facet degenerative changes resulting in mild spinal canal stenose and moderate right neural foraminal narrowing.  T11-12:Disc bulge and facet degenerative changes resulting in mild spinal canal stenosis, severe right and mild left neural foraminal narrowing. T12-L1:Facet degenerative changes. No spinal canal or neural foraminal stenosis. IMPRESSION: 1. Postsurgical changes from T9 left posterior element tumor resection and posterior transpedicular  fixation from T7 through T11. No evidence of recurrent tumor identified. 2. Degenerative changes of the thoracic spine with moderate spinal canal stenosis at T6-7 and mild at T10-11 and T11-12. 3. Severe right neural foraminal narrowing at T11-12. 4. Moderate right neural foraminal narrowing at T10-11. Electronically Signed   By: Pedro Earls M.D.   On: 07/18/2022 17:33    10/13/17 PET/CT     ASSESSMENT & PLAN:   67 y.o. female with  1. Multiple myeloma Currently in remission Autologous PBSCT on 07/01/17. IgG kappa multiple myeloma. Cytogenetics studies demonstrated normal karyotype, FISH studies are positive for 13q34 & D13S319 loss and no loss of p53. Based on initial information, patient was staged at [facility].  Completed 6 cycles of induction with Revlimid, Velcade and dexamethasone  12/09/2018 lumbar spine xray with results revealing "No acute osseous abnormality. Extensive multilevel degenerative changes are noted throughout the lumbar spine."  12/09/2018 sacrum and coccyx xray with results revealing "No acute osseous abnormality. Degenerative changes are noted of both hips."  2. Grade 2 neuropathy -controlled  02/26/18 NCV with EMG which revealed The electrophysiologic findings are most consistent with a chronic, length-dependent sensorimotor axonal and demyelinating polyneuropathy affecting the right side. Overall, these findings are moderate to severe in degree electrically. Incidentally, there is a right Martin-Gruber anastomosis, a normal variant.   3. Rt LE calf veins - possible clot ?chronic.  01/29/18 VAS [place] BLE which revealed Right: Ultrasound is unable to distinguish whether obstruction in the Peroneal veins, and great saphenous vein, and superficial veins/varciosities is acute or chronic. No cystic structure found in the popliteal fossa. Left: no evidence of DVT.   PLAN: -Discussed lab results from today, 07/24/2022, with the patient. CBC shows hemoglobin  of 11.8 and hematocrit of 34.6. CMP is stable.  -Recommended to continue following up with PCP. -Patient is tolerating her Zometa infusion well without any toxicities. -Can proceed with Zometa infusion.  - no lab or clinical evidence of myeloma progression at this tme Continue vit D replacement  FOLLOW-UP: Return to clinic with Dr. Irene Limbo with labs and next dose of Zometa in 3 months    The total time spent in the appointment was 20 minutes* .  All of the patient's questions were answered with apparent satisfaction. The patient knows to call the clinic with any problems, questions or concerns.   Sullivan Lone MD MS AAHIVMS [facility] Hematology/Oncology Physician [facility]  .*Total Encounter Time as defined by the Centers for Medicare and Medicaid Services includes, in addition to the face-to-face time of a patient visit (documented in the note above) non-face-to-face time: obtaining and reviewing outside history, ordering and reviewing medications, tests or procedures, care coordination (communications with other health care professionals or caregivers) and documentation in the medical record.   Zettie Cooley, am acting as a Education administrator for Sullivan Lone, MD.

## 2022-07-24 NOTE — Patient Instructions (Signed)

## 2022-07-24 NOTE — Progress Notes (Signed)
Patient seen by MD today  Vitals are within treatment parameters.BP 178/77 Dr Irene Limbo aware  Labs reviewed: and are within treatment parameters."  Per physician team, patient is ready for treatment and there are NO modifications to the treatment plan.

## 2022-07-26 LAB — VITAMIN D 25 HYDROXY (VIT D DEFICIENCY, FRACTURES)

## 2022-07-29 DIAGNOSIS — Z6834 Body mass index (BMI) 34.0-34.9, adult: Secondary | ICD-10-CM | POA: Diagnosis not present

## 2022-07-29 DIAGNOSIS — D492 Neoplasm of unspecified behavior of bone, soft tissue, and skin: Secondary | ICD-10-CM | POA: Diagnosis not present

## 2022-07-30 ENCOUNTER — Encounter: Payer: Self-pay | Admitting: Hematology

## 2022-08-07 DIAGNOSIS — I10 Essential (primary) hypertension: Secondary | ICD-10-CM | POA: Diagnosis not present

## 2022-08-07 DIAGNOSIS — Z0000 Encounter for general adult medical examination without abnormal findings: Secondary | ICD-10-CM | POA: Diagnosis not present

## 2022-08-07 DIAGNOSIS — J301 Allergic rhinitis due to pollen: Secondary | ICD-10-CM | POA: Diagnosis not present

## 2022-08-07 DIAGNOSIS — Z6834 Body mass index (BMI) 34.0-34.9, adult: Secondary | ICD-10-CM | POA: Diagnosis not present

## 2022-08-07 DIAGNOSIS — E0843 Diabetes mellitus due to underlying condition with diabetic autonomic (poly)neuropathy: Secondary | ICD-10-CM | POA: Diagnosis not present

## 2022-08-07 DIAGNOSIS — E1143 Type 2 diabetes mellitus with diabetic autonomic (poly)neuropathy: Secondary | ICD-10-CM | POA: Diagnosis not present

## 2022-08-07 DIAGNOSIS — J449 Chronic obstructive pulmonary disease, unspecified: Secondary | ICD-10-CM | POA: Diagnosis not present

## 2022-08-07 DIAGNOSIS — E7849 Other hyperlipidemia: Secondary | ICD-10-CM | POA: Diagnosis not present

## 2022-08-07 DIAGNOSIS — I5042 Chronic combined systolic (congestive) and diastolic (congestive) heart failure: Secondary | ICD-10-CM | POA: Diagnosis not present

## 2022-08-07 DIAGNOSIS — N1832 Chronic kidney disease, stage 3b: Secondary | ICD-10-CM | POA: Diagnosis not present

## 2022-08-07 DIAGNOSIS — E1121 Type 2 diabetes mellitus with diabetic nephropathy: Secondary | ICD-10-CM | POA: Diagnosis not present

## 2022-08-29 ENCOUNTER — Ambulatory Visit (INDEPENDENT_AMBULATORY_CARE_PROVIDER_SITE_OTHER): Payer: Medicare HMO | Admitting: Gastroenterology

## 2022-10-28 ENCOUNTER — Telehealth: Payer: Self-pay | Admitting: Hematology

## 2022-10-30 ENCOUNTER — Inpatient Hospital Stay: Payer: Medicare HMO

## 2022-10-30 ENCOUNTER — Inpatient Hospital Stay: Payer: Medicare HMO | Admitting: Hematology

## 2022-12-02 ENCOUNTER — Other Ambulatory Visit: Payer: Self-pay

## 2022-12-02 DIAGNOSIS — C9001 Multiple myeloma in remission: Secondary | ICD-10-CM

## 2022-12-04 ENCOUNTER — Inpatient Hospital Stay: Payer: Medicare HMO

## 2022-12-04 ENCOUNTER — Other Ambulatory Visit: Payer: Self-pay

## 2022-12-04 ENCOUNTER — Inpatient Hospital Stay: Payer: Medicare HMO | Attending: Hematology

## 2022-12-04 ENCOUNTER — Inpatient Hospital Stay (HOSPITAL_BASED_OUTPATIENT_CLINIC_OR_DEPARTMENT_OTHER): Payer: Medicare HMO | Admitting: Hematology

## 2022-12-04 VITALS — BP 178/78 | HR 86 | Temp 100.0°F | Resp 17 | Wt 221.4 lb

## 2022-12-04 DIAGNOSIS — Z5112 Encounter for antineoplastic immunotherapy: Secondary | ICD-10-CM | POA: Diagnosis not present

## 2022-12-04 DIAGNOSIS — C9001 Multiple myeloma in remission: Secondary | ICD-10-CM

## 2022-12-04 DIAGNOSIS — Z95828 Presence of other vascular implants and grafts: Secondary | ICD-10-CM

## 2022-12-04 DIAGNOSIS — Z79899 Other long term (current) drug therapy: Secondary | ICD-10-CM | POA: Diagnosis not present

## 2022-12-04 LAB — CMP (CANCER CENTER ONLY)
ALT: 22 U/L (ref 0–44)
AST: 22 U/L (ref 15–41)
Albumin: 3.9 g/dL (ref 3.5–5.0)
Alkaline Phosphatase: 71 U/L (ref 38–126)
Anion gap: 7 (ref 5–15)
BUN: 10 mg/dL (ref 8–23)
CO2: 24 mmol/L (ref 22–32)
Calcium: 9.9 mg/dL (ref 8.9–10.3)
Chloride: 109 mmol/L (ref 98–111)
Creatinine: 1.14 mg/dL — ABNORMAL HIGH (ref 0.44–1.00)
GFR, Estimated: 54 mL/min — ABNORMAL LOW (ref >60)
Glucose, Bld: 107 mg/dL — ABNORMAL HIGH (ref 70–99)
Potassium: 4.1 mmol/L (ref 3.5–5.1)
Sodium: 140 mmol/L (ref 135–145)
Total Bilirubin: 0.4 mg/dL (ref 0.3–1.2)
Total Protein: 7.2 g/dL (ref 6.5–8.1)

## 2022-12-04 LAB — CBC WITH DIFFERENTIAL (CANCER CENTER ONLY)
Abs Immature Granulocytes: 0.01 10*3/uL (ref 0.00–0.07)
Basophils Absolute: 0.1 10*3/uL (ref 0.0–0.1)
Basophils Relative: 1 %
Eosinophils Absolute: 0.1 10*3/uL (ref 0.0–0.5)
Eosinophils Relative: 2 %
HCT: 35.8 % — ABNORMAL LOW (ref 36.0–46.0)
Hemoglobin: 12 g/dL (ref 12.0–15.0)
Immature Granulocytes: 0 %
Lymphocytes Relative: 34 %
Lymphs Abs: 2.2 10*3/uL (ref 0.7–4.0)
MCH: 31.9 pg (ref 26.0–34.0)
MCHC: 33.5 g/dL (ref 30.0–36.0)
MCV: 95.2 fL (ref 80.0–100.0)
Monocytes Absolute: 0.4 10*3/uL (ref 0.1–1.0)
Monocytes Relative: 7 %
Neutro Abs: 3.6 10*3/uL (ref 1.7–7.7)
Neutrophils Relative %: 56 %
Platelet Count: 249 10*3/uL (ref 150–400)
RBC: 3.76 MIL/uL — ABNORMAL LOW (ref 3.87–5.11)
RDW: 14.6 % (ref 11.5–15.5)
WBC Count: 6.4 10*3/uL (ref 4.0–10.5)
nRBC: 0 % (ref 0.0–0.2)

## 2022-12-04 MED ORDER — ZOLEDRONIC ACID 4 MG/100ML IV SOLN
4.0000 mg | Freq: Once | INTRAVENOUS | Status: AC
  Administered 2022-12-04: 4 mg via INTRAVENOUS

## 2022-12-04 MED ORDER — SODIUM CHLORIDE 0.9% FLUSH
10.0000 mL | Freq: Once | INTRAVENOUS | Status: AC
  Administered 2022-12-04: 10 mL

## 2022-12-04 NOTE — Patient Instructions (Signed)

## 2022-12-04 NOTE — Progress Notes (Signed)
HEMATOLOGY/ONCOLOGY CLINIC NOTE  Date of Service: 12/04/22    Patient Care Team: Toma Deiters, MD as PCP - General (Internal Medicine) Glendale Chard, DO as Consulting Physician (Neurology) Kemper Durie, RN as [facility] Care Management  CHIEF COMPLAINTS/PURPOSE OF CONSULTATION:   Follow-up for continued evaluation and management of multiple myeloma  Oncologic History:  67 y.o. female who initially presented with significant weight loss in the context of H. pylori-associated gastritis and alcohol abuse. At the same time, an incidental discovery of a lower thoracic vertebral mass in the context of chronic numbness and weakness in the lower extremities. MRI of the thoracic spine demonstrated a destructive lesion at T8 level and patient underwent surgical treatment. 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 Staging evaluation conducted by our clinic confirmed presence of IgG kappa multiple myeloma. Cytogenetics studies demonstrated normal karyotype, FISH studies are positive for 13q34 & D13S319 loss and no loss of p53. Based on initial information, patient was staged at [facility].    Patient has completed 6 cycles of induction systemic chemotherapy with lenalidomide, bortezomib, and low-dose dexamethasone.  Repeat bone marrow biopsy demonstrated excellent response to treatment and patient subsequently underwent consolidation therapy including melphalan conditioning and autologous stem cell rescue.  Repeat assessment with PET/CT and bone marrow biopsy on 10/13/2017 demonstrates stringent complete response maintenance.  HISTORY OF PRESENTING ILLNESS:  Please see previous note for details on initial presentation  INTERVAL HISTORY:   Marcedes Tech is a 67 y.o. female here for continued evaluation and management of her multiple myeloma. Patient was last seen by me on 07/24/2022 and complained of consistent chronic neuropathy, excessive diarrhea,  and back pain.  Today, she reports that she has been feeling well overall since her last visit. She denies any new medication changes, new dental issues, abdominal pain or infection issues. She denies any falls and continues to use a cane regularly. Patient denies any issues with Zometa infusions.  Patient complains of stable back pain, and neuropathy-related numbness. She endorses LE edema in left lower extremity when standing for extensive periods.   She reports that she was unable to proceed with colonoscopy screening with GI due to her daughter requiring coronary artery bypass graft surgery.   MEDICAL HISTORY:  Past Medical History:  Diagnosis Date   Arthritis    knees   Cancer (HCC)    Carpal tunnel syndrome    CHF (congestive heart failure) (HCC)    Diabetes mellitus    type 2   GERD (gastroesophageal reflux disease)    Heart murmur    "Little, No concerns" per Dr  Andee Lineman pt reported.   Hypertension    controlled using a guided approch with plasma renin activity   Multiple myeloma not having achieved remission (HCC) 01/06/2017   Neuropathy    Peripheral vascular disease (HCC)    Sleep apnea    01/03/2020: per patient, hasn't used CPAP machine in two years due to inability to breathe well with it on    SURGICAL HISTORY: Past Surgical History:  Procedure Laterality Date   BIOPSY  11/28/2016   Procedure: BIOPSY;  Surgeon: Malissa Hippo, MD;  [facility];  Service: Endoscopy;;  gastric   BIOPSY  07/02/2019   Procedure: BIOPSY;  Surgeon: Malissa Hippo, MD;  [facility]: [facility];  Service: Endoscopy;;   BTL     1982   CERVICAL CONE BIOPSY     cervical lesion   COLONOSCOPY WITH  PROPOFOL N/A 07/02/2019   Procedure: COLONOSCOPY WITH PROPOFOL;  Surgeon: Malissa Hippo, MD;  [facility]: [facility];  Service: Endoscopy;  Laterality: N/A;  12:40 - pt can't come earlier due to transportation   ESOPHAGOGASTRODUODENOSCOPY (EGD) WITH PROPOFOL N/A 11/28/2016    Procedure: ESOPHAGOGASTRODUODENOSCOPY (EGD) WITH PROPOFOL;  Surgeon: Malissa Hippo, MD;  [facility];  Service: Endoscopy;  Laterality: N/A;  9:25   IR FLUORO GUIDE PORT INSERTION RIGHT  01/29/2017   IR US GUIDE VASC ACCESS RIGHT  01/29/2017   lipoma removal     right shoulder 2001   LUMBAR FUSION  01/05/2020    Lumbar Two-Three Lumbar Three-Four Lumbar Four-Five Posterior lumbar interbody fusion with decompression (N/A Spine Lumbar)   MULTIPLE EXTRACTIONS WITH ALVEOLOPLASTY Bilateral 08/24/2012   Procedure: MULTIPLE EXTRACTION WITH ALVEOLOPLASTY BIOPSY OF RIGHT AND LEFT MANDIBLE ;  Surgeon: Georgia Lopes, DDS;  Location: [facility];  Service: Oral Surgery;  Laterality: Bilateral;   POLYPECTOMY  07/02/2019   Procedure: POLYPECTOMY;  Surgeon: Malissa Hippo, MD;  [facility];  Service: Endoscopy;;   POSTERIOR LUMBAR FUSION 4 LEVEL N/A 12/26/2016   Procedure: THORACIC EIGHT TUMOR RESECTION, THORACIC SIX- THORACIC TEN POSTERIOR SPINAL FUSION;  Surgeon: Coletta Memos, MD;  Location: [facility];  Service: Neurosurgery;  Laterality: N/A;  THORACIC 8 TUMOR RESECTION, THORACIC 6- THORACIC 10 POSTERIOR SPINAL FUSION   ROTATOR CUFF REPAIR     right shoulder   TUBAL LIGATION      SOCIAL HISTORY: Social History   Socioeconomic History   Marital status: Single    Spouse name: Not on file   Number of children: 3   Years of education: Not on file   Highest education level: Not on file  Occupational History   Occupation: on disability   Occupation: former CNA  Tobacco Use   Smoking status: Former    Packs/day: 0.50    Years: 6.00    Additional pack years: 0.00    Total pack years: 3.00    Types: Cigarettes    Quit date: 2019    Years since quitting: 5.4   Smokeless tobacco: Never  Vaping Use   Vaping Use: Never used  Substance and Sexual Activity   Alcohol use: No    Alcohol/week: 6.0 standard drinks of alcohol    Types: 6 Cans of beer per week   Drug use: No   Sexual  activity: Not Currently    Birth control/protection: Post-menopausal  Other Topics Concern   Not on file  Social History Narrative   Lives with son, daughter and grandkids in a 2 story home but stays on the first floor.  Has 3 children.  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      Right handed   Social Determinants of Health   Financial Resource Strain: Not on file  Food Insecurity: No Food Insecurity (01/30/2022)   Hunger Vital Sign    Worried About Running Out of Food in the Last Year: Never true    Ran Out of Food in the Last Year: Never true  Transportation Needs: No Transportation Needs (01/30/2022)   PRAPARE - Administrator, Civil Service (Medical): No    Lack of Transportation (Non-Medical): No  Physical Activity: Not on file  Stress: Not on file  Social Connections: Not on file  Intimate Partner Violence: Not on file    FAMILY HISTORY: Family History  Problem Relation Age  of Onset   Diabetes Mother    Hypertension Mother    Heart disease Mother    Alzheimer's disease Mother    Diabetes Father    Heart disease Sister    Diabetes Sister     ALLERGIES:  is allergic to hydrocodone.  MEDICATIONS:  Current Outpatient Medications  Medication Sig Dispense Refill   amLODipine (NORVASC) 5 MG tablet Take 5 mg by mouth daily.     apixaban (ELIQUIS) 5 MG TABS tablet Take 1 tablet (5 mg total) by mouth 2 (two) times daily.     atorvastatin (LIPITOR) 40 MG tablet Take 40 mg by mouth daily.     enalapril (VASOTEC) 20 MG tablet Take 20 mg by mouth 2 (two) times daily.      gabapentin (NEURONTIN) 300 MG capsule TAKE 2 CAPSULES (600 MG TOTAL) BY MOUTH 3 (THREE) TIMES DAILY. 540 capsule 0   hydrALAZINE (APRESOLINE) 25 MG tablet Take 25 mg by mouth 2 (two) times daily.     insulin degludec (TRESIBA FLEXTOUCH) 100 UNIT/ML SOPN FlexTouch Pen Inject 0.5 mLs (50 Units total) into the skin daily at 10 pm. (Patient taking differently: Inject 40 Units  into the skin daily at 10 pm.) 5 pen 2   Insulin Pen Needle (B-D ULTRAFINE III SHORT PEN) 31G X 8 MM MISC 1 each by Does not apply route as directed. 100 each 3   metFORMIN (GLUCOPHAGE) 1000 MG tablet Take 1,000 mg by mouth 2 (two) times daily.      metoprolol succinate (TOPROL-XL) 50 MG 24 hr tablet Take 50 mg by mouth daily. Take with or immediately following a meal.     Omega-3 Fatty Acids (FISH OIL) 1000 MG CPDR Take 1,000 mg by mouth daily. Omega 3 300 mg     potassium chloride (MICRO-K) 10 MEQ CR capsule Take 10 mEq by mouth daily.     Vitamin D, Ergocalciferol, (DRISDOL) 50000 units CAPS capsule Take 50,000 Units by mouth every Monday.      No current facility-administered medications for this visit.    REVIEW OF SYSTEMS:    10 Point review of Systems was done is negative except as noted above.   PHYSICAL EXAMINATION: .BP (!) 178/78 (BP Location: Left Arm, Patient Position: Sitting) Comment: nurse notified  Pulse 86   Temp 100 F (37.8 C) (Oral)   Resp 17   Wt 221 lb 6.4 oz (100.4 kg)   SpO2 100%   BMI 32.70 kg/m    GENERAL:alert, in no acute distress and comfortable SKIN: no acute rashes, no significant lesions EYES: conjunctiva are pink and non-injected, sclera anicteric OROPHARYNX: MMM, no exudates, no oropharyngeal erythema or ulceration NECK: supple, no JVD LYMPH:  no palpable lymphadenopathy in the cervical, axillary or inguinal regions LUNGS: clear to auscultation b/l with normal respiratory effort HEART: regular rate & rhythm ABDOMEN:  normoactive bowel sounds , non tender, not distended. Extremity: no pedal edema PSYCH: alert & oriented x 3 with fluent speech NEURO: no focal motor/sensory deficits   LABORATORY DATA:  I have reviewed the data as listed  .    Latest Ref Rng & Units 12/04/2022   12:12 PM 07/24/2022   11:21 AM 04/16/2022    1:02 PM  CBC  WBC 4.0 - 10.5 K/uL 6.4  5.9  6.7   Hemoglobin 12.0 - 15.0 g/dL 31.5  17.6  16.0   Hematocrit 36.0 -  46.0 % 35.8  34.6  36.4   Platelets 150 - 400 K/uL 249  240  260     .    Latest Ref Rng & Units 12/04/2022   12:12 PM 07/24/2022   11:21 AM 04/16/2022    1:02 PM  CMP  Glucose 70 - 99 mg/dL 366  440  347   BUN 8 - 23 mg/dL 10  11  11    Creatinine 0.44 - 1.00 mg/dL 4.25  9.56  3.87   Sodium 135 - 145 mmol/L 140  140  142   Potassium 3.5 - 5.1 mmol/L 4.1  4.2  4.0   Chloride 98 - 111 mmol/L 109  109  110   CO2 22 - 32 mmol/L 24  24  23    Calcium 8.9 - 10.3 mg/dL 9.9  56.4  9.8   Total Protein 6.5 - 8.1 g/dL 7.2  6.7  7.8   Total Bilirubin 0.3 - 1.2 mg/dL 0.4  0.5  0.5   Alkaline Phos 38 - 126 U/L 71  52  74   AST 15 - 41 U/L 22  19  27    ALT 0 - 44 U/L 22  17  21        03/25/18 SPEP and SFLC:     04/21/17 Cytogenetics:    01/17/17 Cytogenetics:    RADIOGRAPHIC STUDIES: I have personally reviewed the radiological images as listed and agreed with the findings in the report. No results found.  10/13/17 PET/CT     ASSESSMENT & PLAN:   67 y.o. female with  1. Multiple myeloma Currently in remission Autologous PBSCT on 07/01/17. IgG kappa multiple myeloma. Cytogenetics studies demonstrated normal karyotype, FISH studies are positive for 13q34 & D13S319 loss and no loss of p53. Based on initial information, patient was staged at [facility].  Completed 6 cycles of induction with Revlimid, Velcade and dexamethasone  12/09/2018 lumbar spine xray with results revealing "No acute osseous abnormality. Extensive multilevel degenerative changes are noted throughout the lumbar spine."  12/09/2018 sacrum and coccyx xray with results revealing "No acute osseous abnormality. Degenerative changes are noted of both hips."  2. Grade 2 neuropathy -controlled  02/26/18 NCV with EMG which revealed The electrophysiologic findings are most consistent with a chronic, length-dependent sensorimotor axonal and demyelinating polyneuropathy affecting the right side. Overall, these findings are  moderate to severe in degree electrically. Incidentally, there is a right Martin-Gruber anastomosis, a normal variant.   3. Rt LE calf veins - possible clot ?chronic.  01/29/18 VAS [place] BLE which revealed Right: Ultrasound is unable to distinguish whether obstruction in the Peroneal veins, and great saphenous vein, and superficial veins/varciosities is acute or chronic. No cystic structure found in the popliteal fossa. Left: no evidence of DVT.   PLAN: -Discussed lab results on 12/04/2022 in detail with patient. CBC normal, showed WBC of 6.4K, hemoglobin of 12.0, and platelets of 249K. -no anemia -CMP normal -myeloma panel M spike of 0.2g/dl IgG Kappa. -Patient is tolerating her Zometa infusion well without any toxicities. -Can proceed with Zometa infusion.  -no clinical evidence of myeloma progression at this tme -Continue vitamin D replacement -advised patient to continue to stay active regularly  FOLLOW-UP:  next dose of Zometa in 3 months Labs in 4 weeks Phone visit with Dr Candise Che in 6 weeks  The total time spent in the appointment was 20 minutes* .  All of the patient's questions were answered with apparent satisfaction. The patient knows to call the clinic with any problems, questions or concerns.   Wyvonnia Lora MD MS AAHIVMS [facility] Hematology/Oncology  Physician [facility]  .*Total Encounter Time as defined by the Centers for Medicare and Medicaid Services includes, in addition to the face-to-face time of a patient visit (documented in the note above) non-face-to-face time: obtaining and reviewing outside history, ordering and reviewing medications, tests or procedures, care coordination (communications with other health care professionals or caregivers) and documentation in the medical record.    I,Mitra Faeizi,acting as a Neurosurgeon for Wyvonnia Lora, MD.,have documented all relevant documentation on the behalf of Wyvonnia Lora, MD,as directed by  Wyvonnia Lora, MD while in  the presence of Wyvonnia Lora, MD.  .I have reviewed the above documentation for accuracy and completeness, and I agree with the above. [name] MD

## 2022-12-05 LAB — KAPPA/LAMBDA LIGHT CHAINS
Kappa free light chain: 37.2 mg/L — ABNORMAL HIGH (ref 3.3–19.4)
Kappa, lambda light chain ratio: 1.63 (ref 0.26–1.65)
Lambda free light chains: 22.8 mg/L (ref 5.7–26.3)

## 2022-12-09 LAB — MULTIPLE MYELOMA PANEL, SERUM
Albumin SerPl Elph-Mcnc: 3.9 g/dL (ref 2.9–4.4)
Albumin/Glob SerPl: 1.2 (ref 0.7–1.7)
Alpha 1: 0.2 g/dL (ref 0.0–0.4)
Alpha2 Glob SerPl Elph-Mcnc: 0.8 g/dL (ref 0.4–1.0)
B-Globulin SerPl Elph-Mcnc: 0.9 g/dL (ref 0.7–1.3)
Gamma Glob SerPl Elph-Mcnc: 1.3 g/dL (ref 0.4–1.8)
Globulin, Total: 3.3 g/dL (ref 2.2–3.9)
IgA: 216 mg/dL (ref 87–352)
IgG (Immunoglobin G), Serum: 1363 mg/dL (ref 586–1602)
IgM (Immunoglobulin M), Srm: 39 mg/dL (ref 26–217)
M Protein SerPl Elph-Mcnc: 0.2 g/dL — ABNORMAL HIGH
Total Protein ELP: 7.2 g/dL (ref 6.0–8.5)

## 2022-12-10 ENCOUNTER — Encounter: Payer: Self-pay | Admitting: Hematology

## 2022-12-11 ENCOUNTER — Telehealth: Payer: Self-pay | Admitting: Hematology

## 2022-12-20 ENCOUNTER — Telehealth: Payer: Self-pay

## 2022-12-20 NOTE — Telephone Encounter (Signed)
Contacted pt per Dr Candise Che: to let patient know M spike detectable at 0.2g/dl --   Will need  Labs in 4 weeks  Phone visit with Dr Candise Che in 6 weeks   Pt acknowledged information and verbalized understanding.

## 2022-12-31 DIAGNOSIS — J301 Allergic rhinitis due to pollen: Secondary | ICD-10-CM | POA: Diagnosis not present

## 2022-12-31 DIAGNOSIS — Z0000 Encounter for general adult medical examination without abnormal findings: Secondary | ICD-10-CM | POA: Diagnosis not present

## 2022-12-31 DIAGNOSIS — I10 Essential (primary) hypertension: Secondary | ICD-10-CM | POA: Diagnosis not present

## 2022-12-31 DIAGNOSIS — N1831 Chronic kidney disease, stage 3a: Secondary | ICD-10-CM | POA: Diagnosis not present

## 2022-12-31 DIAGNOSIS — E7849 Other hyperlipidemia: Secondary | ICD-10-CM | POA: Diagnosis not present

## 2022-12-31 DIAGNOSIS — Z6833 Body mass index (BMI) 33.0-33.9, adult: Secondary | ICD-10-CM | POA: Diagnosis not present

## 2022-12-31 DIAGNOSIS — I5042 Chronic combined systolic (congestive) and diastolic (congestive) heart failure: Secondary | ICD-10-CM | POA: Diagnosis not present

## 2022-12-31 DIAGNOSIS — N1832 Chronic kidney disease, stage 3b: Secondary | ICD-10-CM | POA: Diagnosis not present

## 2022-12-31 DIAGNOSIS — E1121 Type 2 diabetes mellitus with diabetic nephropathy: Secondary | ICD-10-CM | POA: Diagnosis not present

## 2022-12-31 DIAGNOSIS — E1143 Type 2 diabetes mellitus with diabetic autonomic (poly)neuropathy: Secondary | ICD-10-CM | POA: Diagnosis not present

## 2022-12-31 DIAGNOSIS — J449 Chronic obstructive pulmonary disease, unspecified: Secondary | ICD-10-CM | POA: Diagnosis not present

## 2023-01-06 ENCOUNTER — Other Ambulatory Visit: Payer: Self-pay | Admitting: *Deleted

## 2023-01-06 DIAGNOSIS — C9001 Multiple myeloma in remission: Secondary | ICD-10-CM

## 2023-01-08 ENCOUNTER — Other Ambulatory Visit: Payer: Self-pay

## 2023-01-08 ENCOUNTER — Inpatient Hospital Stay: Payer: Medicare HMO | Attending: Hematology

## 2023-01-08 DIAGNOSIS — C9001 Multiple myeloma in remission: Secondary | ICD-10-CM | POA: Insufficient documentation

## 2023-01-08 DIAGNOSIS — Z95828 Presence of other vascular implants and grafts: Secondary | ICD-10-CM

## 2023-01-08 LAB — CBC WITH DIFFERENTIAL (CANCER CENTER ONLY)
Abs Immature Granulocytes: 0.02 10*3/uL (ref 0.00–0.07)
Basophils Absolute: 0.1 10*3/uL (ref 0.0–0.1)
Basophils Relative: 1 %
Eosinophils Absolute: 0.1 10*3/uL (ref 0.0–0.5)
Eosinophils Relative: 2 %
HCT: 35.7 % — ABNORMAL LOW (ref 36.0–46.0)
Hemoglobin: 11.9 g/dL — ABNORMAL LOW (ref 12.0–15.0)
Immature Granulocytes: 0 %
Lymphocytes Relative: 36 %
Lymphs Abs: 2.3 10*3/uL (ref 0.7–4.0)
MCH: 32.1 pg (ref 26.0–34.0)
MCHC: 33.3 g/dL (ref 30.0–36.0)
MCV: 96.2 fL (ref 80.0–100.0)
Monocytes Absolute: 0.4 10*3/uL (ref 0.1–1.0)
Monocytes Relative: 6 %
Neutro Abs: 3.5 10*3/uL (ref 1.7–7.7)
Neutrophils Relative %: 55 %
Platelet Count: 241 10*3/uL (ref 150–400)
RBC: 3.71 MIL/uL — ABNORMAL LOW (ref 3.87–5.11)
RDW: 14.6 % (ref 11.5–15.5)
WBC Count: 6.5 10*3/uL (ref 4.0–10.5)
nRBC: 0 % (ref 0.0–0.2)

## 2023-01-08 LAB — CMP (CANCER CENTER ONLY)
ALT: 18 U/L (ref 0–44)
AST: 20 U/L (ref 15–41)
Albumin: 4.2 g/dL (ref 3.5–5.0)
Alkaline Phosphatase: 77 U/L (ref 38–126)
Anion gap: 7 (ref 5–15)
BUN: 14 mg/dL (ref 8–23)
CO2: 23 mmol/L (ref 22–32)
Calcium: 9.8 mg/dL (ref 8.9–10.3)
Chloride: 111 mmol/L (ref 98–111)
Creatinine: 1.15 mg/dL — ABNORMAL HIGH (ref 0.44–1.00)
GFR, Estimated: 53 mL/min — ABNORMAL LOW (ref >60)
Glucose, Bld: 77 mg/dL (ref 70–99)
Potassium: 3.9 mmol/L (ref 3.5–5.1)
Sodium: 141 mmol/L (ref 135–145)
Total Bilirubin: 0.4 mg/dL (ref 0.3–1.2)
Total Protein: 7.3 g/dL (ref 6.5–8.1)

## 2023-01-08 MED ORDER — SODIUM CHLORIDE 0.9% FLUSH
10.0000 mL | Freq: Once | INTRAVENOUS | Status: AC
  Administered 2023-01-08: 10 mL

## 2023-01-08 MED ORDER — HEPARIN SOD (PORK) LOCK FLUSH 100 UNIT/ML IV SOLN
500.0000 [IU] | Freq: Once | INTRAVENOUS | Status: AC
  Administered 2023-01-08: 500 [IU]

## 2023-01-09 LAB — KAPPA/LAMBDA LIGHT CHAINS
Kappa free light chain: 45.4 mg/L — ABNORMAL HIGH (ref 3.3–19.4)
Lambda free light chains: 24.8 mg/L (ref 5.7–26.3)

## 2023-01-14 DIAGNOSIS — E119 Type 2 diabetes mellitus without complications: Secondary | ICD-10-CM | POA: Diagnosis not present

## 2023-01-14 DIAGNOSIS — H2513 Age-related nuclear cataract, bilateral: Secondary | ICD-10-CM | POA: Diagnosis not present

## 2023-01-14 DIAGNOSIS — H35373 Puckering of macula, bilateral: Secondary | ICD-10-CM | POA: Diagnosis not present

## 2023-01-14 DIAGNOSIS — Z7984 Long term (current) use of oral hypoglycemic drugs: Secondary | ICD-10-CM | POA: Diagnosis not present

## 2023-01-21 DIAGNOSIS — I2510 Atherosclerotic heart disease of native coronary artery without angina pectoris: Secondary | ICD-10-CM | POA: Diagnosis not present

## 2023-01-21 DIAGNOSIS — I5020 Unspecified systolic (congestive) heart failure: Secondary | ICD-10-CM | POA: Diagnosis not present

## 2023-01-21 DIAGNOSIS — I10 Essential (primary) hypertension: Secondary | ICD-10-CM | POA: Diagnosis not present

## 2023-01-21 DIAGNOSIS — E7800 Pure hypercholesterolemia, unspecified: Secondary | ICD-10-CM | POA: Diagnosis not present

## 2023-01-21 DIAGNOSIS — I340 Nonrheumatic mitral (valve) insufficiency: Secondary | ICD-10-CM | POA: Diagnosis not present

## 2023-01-23 ENCOUNTER — Inpatient Hospital Stay: Payer: Medicare HMO | Attending: Hematology | Admitting: Hematology

## 2023-01-23 DIAGNOSIS — C9002 Multiple myeloma in relapse: Secondary | ICD-10-CM | POA: Diagnosis not present

## 2023-01-23 NOTE — Progress Notes (Signed)
HEMATOLOGY/ONCOLOGY PHONE VISIT NOTE  Date of Service: 01/23/23    Patient Care Team: Toma Deiters, MD as PCP - General (Internal Medicine) Glendale Chard, DO as Consulting Physician (Neurology) Kemper Durie, RN as [facility] Care Management  CHIEF COMPLAINTS/PURPOSE OF CONSULTATION:   Follow-up for continued evaluation and management of multiple myeloma  Oncologic History:  67 y.o. female who initially presented with significant weight loss in the context of H. pylori-associated gastritis and alcohol abuse. At the same time, an incidental discovery of a lower thoracic vertebral mass in the context of chronic numbness and weakness in the lower extremities. MRI of the thoracic spine demonstrated a destructive lesion at T8 level and patient underwent surgical treatment. 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 Staging evaluation conducted by our clinic confirmed presence of IgG kappa multiple myeloma. Cytogenetics studies demonstrated normal karyotype, FISH studies are positive for 13q34 & D13S319 loss and no loss of p53. Based on initial information, patient was staged at [facility].    Patient has completed 6 cycles of induction systemic chemotherapy with lenalidomide, bortezomib, and low-dose dexamethasone.  Repeat bone marrow biopsy demonstrated excellent response to treatment and patient subsequently underwent consolidation therapy including melphalan conditioning and autologous stem cell rescue.  Repeat assessment with PET/CT and bone marrow biopsy on 10/13/2017 demonstrates stringent complete response maintenance.  HISTORY OF PRESENTING ILLNESS:  Please see previous note for details on initial presentation  INTERVAL HISTORY:   Christine Garrett is a 67 y.o. female here for continued evaluation and management of her multiple myeloma. Patient was last seen by me on 12/04/2022 and reported stable back pain, neuropathy-related numbness,  and left lower extremity edema with extensive periods of standing.  I connected with [name] on 01/23/23 at  3:30 PM EDT by telephone visit and verified that I am speaking with the correct person using two identifiers.   I discussed the limitations, risks, security and privacy concerns of performing an evaluation and management service by telemedicine and the availability of in-person appointments. I also discussed with the patient that there may be a patient responsible charge related to this service. The patient expressed understanding and agreed to proceed.   Other persons participating in the visit and their role in the encounter: none   Patient's location: home  Provider's location: [facility]   Chief Complaint: evaluation and management of her multiple myeloma    Today, she reports that she has been doing well overall since her last clinical visit. She continues to endorse loose stools which are bothersome. Patient regularly takes Metformin twice daily.  MEDICAL HISTORY:  Past Medical History:  Diagnosis Date   Arthritis    knees   Cancer (HCC)    Carpal tunnel syndrome    CHF (congestive heart failure) (HCC)    Diabetes mellitus    type 2   GERD (gastroesophageal reflux disease)    Heart murmur    "Little, No concerns" per Dr  Andee Lineman pt reported.   Hypertension    controlled using a guided approch with plasma renin activity   Multiple myeloma not having achieved remission (HCC) 01/06/2017   Neuropathy    Peripheral vascular disease (HCC)    Sleep apnea    01/03/2020: per patient, hasn't used CPAP machine in two years due to inability to breathe well with it on    SURGICAL HISTORY: Past Surgical History:  Procedure Laterality Date   BIOPSY  11/28/2016   Procedure: BIOPSY;  Surgeon:  Malissa Hippo, MD;  [facility];  Service: Endoscopy;;  gastric   BIOPSY  07/02/2019   Procedure: BIOPSY;  Surgeon: Malissa Hippo, MD;  [facility];  Service:  Endoscopy;;   BTL     1982   CERVICAL CONE BIOPSY     cervical lesion   COLONOSCOPY WITH PROPOFOL N/A 07/02/2019   Procedure: COLONOSCOPY WITH PROPOFOL;  Surgeon: Malissa Hippo, MD;  [facility];  Service: Endoscopy;  Laterality: N/A;  12:40 - pt can't come earlier due to transportation   ESOPHAGOGASTRODUODENOSCOPY (EGD) WITH PROPOFOL N/A 11/28/2016   Procedure: ESOPHAGOGASTRODUODENOSCOPY (EGD) WITH PROPOFOL;  Surgeon: Malissa Hippo, MD;  [facility];  Service: Endoscopy;  Laterality: N/A;  9:25   IR FLUORO GUIDE PORT INSERTION RIGHT  01/29/2017   IR US GUIDE VASC ACCESS RIGHT  01/29/2017   lipoma removal     right shoulder 2001   LUMBAR FUSION  01/05/2020    Lumbar Two-Three Lumbar Three-Four Lumbar Four-Five Posterior lumbar interbody fusion with decompression (N/A Spine Lumbar)   MULTIPLE EXTRACTIONS WITH ALVEOLOPLASTY Bilateral 08/24/2012   Procedure: MULTIPLE EXTRACTION WITH ALVEOLOPLASTY BIOPSY OF RIGHT AND LEFT MANDIBLE ;  Surgeon: Georgia Lopes, DDS;  Location: [facility];  Service: Oral Surgery;  Laterality: Bilateral;   POLYPECTOMY  07/02/2019   Procedure: POLYPECTOMY;  Surgeon: Malissa Hippo, MD;  [facility];  Service: Endoscopy;;   POSTERIOR LUMBAR FUSION 4 LEVEL N/A 12/26/2016   Procedure: THORACIC EIGHT TUMOR RESECTION, THORACIC SIX- THORACIC TEN POSTERIOR SPINAL FUSION;  Surgeon: Coletta Memos, MD;  Location: [facility];  Service: Neurosurgery;  Laterality: N/A;  THORACIC 8 TUMOR RESECTION, THORACIC 6- THORACIC 10 POSTERIOR SPINAL FUSION   ROTATOR CUFF REPAIR     right shoulder   TUBAL LIGATION      SOCIAL HISTORY: Social History   Socioeconomic History   Marital status: Single    Spouse name: Not on file   Number of children: 3   Years of education: Not on file   Highest education level: Not on file  Occupational History   Occupation: on disability   Occupation: former CNA  Tobacco Use   Smoking status: Former    Current packs/day:  0.00    Average packs/day: 0.5 packs/day for 6.0 years (3.0 ttl pk-yrs)    Types: Cigarettes    Start date: 2013    Quit date: 2019    Years since quitting: 5.6   Smokeless tobacco: Never  Vaping Use   Vaping status: Never Used  Substance and Sexual Activity   Alcohol use: No    Alcohol/week: 6.0 standard drinks of alcohol    Types: 6 Cans of beer per week   Drug use: No   Sexual activity: Not Currently    Birth control/protection: Post-menopausal  Other Topics Concern   Not on file  Social History Narrative   Lives with son, daughter and grandkids in a 2 story home but stays on the first floor.  Has 3 children.  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      Right handed   Social Determinants of Health   Financial Resource Strain: Not on file  Food Insecurity: No Food Insecurity (01/30/2022)   Hunger Vital Sign    Worried About Running Out of Food in the Last Year: Never true    Ran Out of Food in the Last Year: Never true  Transportation  Needs: No Transportation Needs (01/30/2022)   PRAPARE - Administrator, Civil Service (Medical): No    Lack of Transportation (Non-Medical): No  Physical Activity: Not on file  Stress: Not on file  Social Connections: Unknown (05/21/2022)   Received from [facility]   Social Network    Social Network: Not on file  Intimate Partner Violence: Unknown (05/21/2022)   Received from [facility]   HITS    Physically Hurt: Not on file    Insult or Talk Down To: Not on file    Threaten Physical Harm: Not on file    Scream or Curse: Not on file    FAMILY HISTORY: Family History  Problem Relation Age of Onset   Diabetes Mother    Hypertension Mother    Heart disease Mother    Alzheimer's disease Mother    Diabetes Father    Heart disease Sister    Diabetes Sister     ALLERGIES:  is allergic to hydrocodone.  MEDICATIONS:  Current Outpatient Medications  Medication Sig Dispense Refill    amLODipine (NORVASC) 5 MG tablet Take 5 mg by mouth daily.     apixaban (ELIQUIS) 5 MG TABS tablet Take 1 tablet (5 mg total) by mouth 2 (two) times daily.     atorvastatin (LIPITOR) 40 MG tablet Take 40 mg by mouth daily.     enalapril (VASOTEC) 20 MG tablet Take 20 mg by mouth 2 (two) times daily.      gabapentin (NEURONTIN) 300 MG capsule TAKE 2 CAPSULES (600 MG TOTAL) BY MOUTH 3 (THREE) TIMES DAILY. 540 capsule 0   hydrALAZINE (APRESOLINE) 25 MG tablet Take 25 mg by mouth 2 (two) times daily.     insulin degludec (TRESIBA FLEXTOUCH) 100 UNIT/ML SOPN FlexTouch Pen Inject 0.5 mLs (50 Units total) into the skin daily at 10 pm. (Patient taking differently: Inject 40 Units into the skin daily at 10 pm.) 5 pen 2   Insulin Pen Needle (B-D ULTRAFINE III SHORT PEN) 31G X 8 MM MISC 1 each by Does not apply route as directed. 100 each 3   metFORMIN (GLUCOPHAGE) 1000 MG tablet Take 1,000 mg by mouth 2 (two) times daily.      metoprolol succinate (TOPROL-XL) 50 MG 24 hr tablet Take 50 mg by mouth daily. Take with or immediately following a meal.     Omega-3 Fatty Acids (FISH OIL) 1000 MG CPDR Take 1,000 mg by mouth daily. Omega 3 300 mg     potassium chloride (MICRO-K) 10 MEQ CR capsule Take 10 mEq by mouth daily.     Vitamin D, Ergocalciferol, (DRISDOL) 50000 units CAPS capsule Take 50,000 Units by mouth every Monday.      No current facility-administered medications for this visit.    REVIEW OF SYSTEMS:    10 Point review of Systems was done is negative except as noted above.   PHYSICAL EXAMINATION: TELEMEDICINE VISIT   LABORATORY DATA:  I have reviewed the data as listed      Latest Ref Rng & Units 01/08/2023   12:17 PM 12/04/2022   12:12 PM 07/24/2022   11:21 AM  CBC  WBC 4.0 - 10.5 K/uL 6.5  6.4  5.9   Hemoglobin 12.0 - 15.0 g/dL 62.1  30.8  65.7   Hematocrit 36.0 - 46.0 % 35.7  35.8  34.6   Platelets 150 - 400 K/uL 241  249  240     .    Latest Ref Rng &  Units 01/08/2023   12:17 PM  12/04/2022   12:12 PM 07/24/2022   11:21 AM  CMP  Glucose 70 - 99 mg/dL 77  629  528   BUN 8 - 23 mg/dL 14  10  11    Creatinine 0.44 - 1.00 mg/dL 4.13  2.44  0.10   Sodium 135 - 145 mmol/L 141  140  140   Potassium 3.5 - 5.1 mmol/L 3.9  4.1  4.2   Chloride 98 - 111 mmol/L 111  109  109   CO2 22 - 32 mmol/L 23  24  24    Calcium 8.9 - 10.3 mg/dL 9.8  9.9  27.2   Total Protein 6.5 - 8.1 g/dL 7.3  7.2  6.7   Total Bilirubin 0.3 - 1.2 mg/dL 0.4  0.4  0.5   Alkaline Phos 38 - 126 U/L 77  71  52   AST 15 - 41 U/L 20  22  19    ALT 0 - 44 U/L 18  22  17        03/25/18 SPEP and SFLC:     04/21/17 Cytogenetics:    01/17/17 Cytogenetics:    RADIOGRAPHIC STUDIES: I have personally reviewed the radiological images as listed and agreed with the findings in the report.  No results found.  10/13/17 PET/CT     ASSESSMENT & PLAN:   68 y.o. female with  1. Multiple myeloma Currently in remission Autologous PBSCT on 07/01/17. IgG kappa multiple myeloma. Cytogenetics studies demonstrated normal karyotype, FISH studies are positive for 13q34 & D13S319 loss and no loss of p53. Based on initial information, patient was staged at [facility].  Completed 6 cycles of induction with Revlimid, Velcade and dexamethasone  12/09/2018 lumbar spine xray with results revealing "No acute osseous abnormality. Extensive multilevel degenerative changes are noted throughout the lumbar spine."  12/09/2018 sacrum and coccyx xray with results revealing "No acute osseous abnormality. Degenerative changes are noted of both hips."  2. Grade 2 neuropathy -controlled  02/26/18 NCV with EMG which revealed The electrophysiologic findings are most consistent with a chronic, length-dependent sensorimotor axonal and demyelinating polyneuropathy affecting the right side. Overall, these findings are moderate to severe in degree electrically. Incidentally, there is a right Martin-Gruber anastomosis, a normal variant.   3. Rt  LE calf veins - possible clot ?chronic.  01/29/18 VAS [place] BLE which revealed Right: Ultrasound is unable to distinguish whether obstruction in the Peroneal veins, and great saphenous vein, and superficial veins/varciosities is acute or chronic. No cystic structure found in the popliteal fossa. Left: no evidence of DVT.   PLAN:  -Discussed lab results from 01/08/2023 in detail with patient. CBC stable, showed WBC of 6.5K, hemoglobin of 11.9, and platelets of 241K. -CMP stable -Discussed that her M protein which had previously remained undetectable for a while, gradually increased to 0.3 and her light chains are returning. Discussed that these findings are concerning for return of myeloma with igG kappa protein. At these levels, patient is not symptomatically affected at this time.  -will order PET scan and bone marrow biopsy for further evaluation -Continue Eliquis to address concern for repeat blood clots -Patient has tolerated her Zometa infusion well without any toxicities. -Continue vitamin D replacement -advised patient to connect with PCP to consider adjusting dose of Metformin to improve diarrhea  FOLLOW-UP: Pet/ct in 1 week CT bone marrow aspiration/Biopsy in 1 week RTC with Dr Candise Che in 3 weeks after PET and BM Bx  The total time spent  in the appointment was 20 minutes* .  All of the patient's questions were answered with apparent satisfaction. The patient knows to call the clinic with any problems, questions or concerns.   Wyvonnia Lora MD MS AAHIVMS [facility] Hematology/Oncology Physician [facility]  .*Total Encounter Time as defined by the Centers for Medicare and Medicaid Services includes, in addition to the face-to-face time of a patient visit (documented in the note above) non-face-to-face time: obtaining and reviewing outside history, ordering and reviewing medications, tests or procedures, care coordination (communications with other health care professionals or  caregivers) and documentation in the medical record.    I,Mitra Faeizi,acting as a Neurosurgeon for Wyvonnia Lora, MD.,have documented all relevant documentation on the behalf of Wyvonnia Lora, MD,as directed by  Wyvonnia Lora, MD while in the presence of Wyvonnia Lora, MD.  .I have reviewed the above documentation for accuracy and completeness, and I agree with the above. [name] MD

## 2023-01-27 ENCOUNTER — Encounter: Payer: Self-pay | Admitting: Hematology

## 2023-02-10 ENCOUNTER — Ambulatory Visit (HOSPITAL_COMMUNITY)
Admission: RE | Admit: 2023-02-10 | Discharge: 2023-02-10 | Disposition: A | Discharge status: Home / Self Care | Payer: Medicare HMO | Source: Ambulatory Visit | Attending: Hematology | Admitting: Hematology

## 2023-02-10 DIAGNOSIS — C9002 Multiple myeloma in relapse: Secondary | ICD-10-CM | POA: Insufficient documentation

## 2023-02-10 DIAGNOSIS — C9000 Multiple myeloma not having achieved remission: Secondary | ICD-10-CM | POA: Diagnosis not present

## 2023-02-10 LAB — GLUCOSE, CAPILLARY: Glucose-Capillary: 142 mg/dL — ABNORMAL HIGH (ref 70–99)

## 2023-02-10 MED ORDER — FLUDEOXYGLUCOSE F - 18 (FDG) INJECTION
11.0000 | Freq: Once | INTRAVENOUS | Status: AC
  Administered 2023-02-10: 11 via INTRAVENOUS

## 2023-02-14 NOTE — H&P (Signed)
Chief Complaint: Patient was seen in consultation today for multiple myeloma.  Referring Physician(s): [name]  Supervising Physician: Richarda Overlie  Patient Status: [facility] - Out-pt  History of Present Illness: Christine Garrett is a 67 y.o. female with a past medical history significant for ETOH abuse, GERD, PVD, HTN, CHF, DM, multiple myeloma who presents today for a bone marrow aspiration/biopsy. Christine Garrett was diagnosed with multiple myeloma in 2018 and has undergone several rounds of systemic therapy as well as autologous peripheral blood stem cell transplant (2019). She was last seen by hematology/oncology 01/23/23 where she was noted on recent labs to have an increase in M protein as well as light chains leading to a concern for multiple myeloma progression. She has been referred to IR for bone marrow aspiration/biopsy for further evaluation. Patient previously underwent bone marrow biopsy in IR 01/17/17, 04/21/17.   Past Medical History:  Diagnosis Date   Arthritis    knees   Cancer (HCC)    Carpal tunnel syndrome    CHF (congestive heart failure) (HCC)    Diabetes mellitus    type 2   GERD (gastroesophageal reflux disease)    Heart murmur    "Little, No concerns" per Dr  Andee Lineman pt reported.   Hypertension    controlled using a guided approch with plasma renin activity   Multiple myeloma not having achieved remission (HCC) 01/06/2017   Neuropathy    Peripheral vascular disease (HCC)    Sleep apnea    01/03/2020: per patient, hasn't used CPAP machine in two years due to inability to breathe well with it on    Past Surgical History:  Procedure Laterality Date   BIOPSY  11/28/2016   Procedure: BIOPSY;  Surgeon: Malissa Hippo, MD;  [facility];  Service: Endoscopy;;  gastric   BIOPSY  07/02/2019   Procedure: BIOPSY;  Surgeon: Malissa Hippo, MD;  [facility];  Service: Endoscopy;;   BTL     1982   CERVICAL CONE BIOPSY     cervical  lesion   COLONOSCOPY WITH PROPOFOL N/A 07/02/2019   Procedure: COLONOSCOPY WITH PROPOFOL;  Surgeon: Malissa Hippo, MD;  [facility];  Service: Endoscopy;  Laterality: N/A;  12:40 - pt can't come earlier due to transportation   ESOPHAGOGASTRODUODENOSCOPY (EGD) WITH PROPOFOL N/A 11/28/2016   Procedure: ESOPHAGOGASTRODUODENOSCOPY (EGD) WITH PROPOFOL;  Surgeon: Malissa Hippo, MD;  [facility];  Service: Endoscopy;  Laterality: N/A;  9:25   IR FLUORO GUIDE PORT INSERTION RIGHT  01/29/2017   IR US GUIDE VASC ACCESS RIGHT  01/29/2017   lipoma removal     right shoulder 2001   LUMBAR FUSION  01/05/2020    Lumbar Two-Three Lumbar Three-Four Lumbar Four-Five Posterior lumbar interbody fusion with decompression (N/A Spine Lumbar)   MULTIPLE EXTRACTIONS WITH ALVEOLOPLASTY Bilateral 08/24/2012   Procedure: MULTIPLE EXTRACTION WITH ALVEOLOPLASTY BIOPSY OF RIGHT AND LEFT MANDIBLE ;  Surgeon: Georgia Lopes, DDS;  Location: [facility];  Service: Oral Surgery;  Laterality: Bilateral;   POLYPECTOMY  07/02/2019   Procedure: POLYPECTOMY;  Surgeon: Malissa Hippo, MD;  [facility];  Service: Endoscopy;;   POSTERIOR LUMBAR FUSION 4 LEVEL N/A 12/26/2016   Procedure: THORACIC EIGHT TUMOR RESECTION, THORACIC SIX- THORACIC TEN POSTERIOR SPINAL FUSION;  Surgeon: Coletta Memos, MD;  Location: [facility];  Service: Neurosurgery;  Laterality: N/A;  THORACIC 8 TUMOR RESECTION, THORACIC 6- THORACIC 10 POSTERIOR SPINAL FUSION   ROTATOR CUFF REPAIR  right shoulder   TUBAL LIGATION      Allergies: Hydrocodone  Medications: Prior to Admission medications   Medication Sig Start Date End Date Taking? Authorizing Provider  amLODipine (NORVASC) 5 MG tablet Take 5 mg by mouth daily. 09/25/19   [provider]  apixaban (ELIQUIS) 5 MG TABS tablet Take 1 tablet (5 mg total) by mouth 2 (two) times daily. 07/03/19   Rehman, Joline Maxcy, MD  atorvastatin (LIPITOR) 40 MG tablet Take 40 mg by mouth  daily.    [provider]  enalapril (VASOTEC) 20 MG tablet Take 20 mg by mouth 2 (two) times daily.     [provider]  gabapentin (NEURONTIN) 300 MG capsule TAKE 2 CAPSULES (600 MG TOTAL) BY MOUTH 3 (THREE) TIMES DAILY. 08/30/20   Nita Sickle K, DO  hydrALAZINE (APRESOLINE) 25 MG tablet Take 25 mg by mouth 2 (two) times daily. 09/29/19   [provider]  insulin degludec (TRESIBA FLEXTOUCH) 100 UNIT/ML SOPN FlexTouch Pen Inject 0.5 mLs (50 Units total) into the skin daily at 10 pm. Patient taking differently: Inject 40 Units into the skin daily at 10 pm. 11/20/16   Nida, Denman George, MD  Insulin Pen Needle (B-D ULTRAFINE III SHORT PEN) 31G X 8 MM MISC 1 each by Does not apply route as directed. 11/13/16   Roma Kayser, MD  metFORMIN (GLUCOPHAGE) 1000 MG tablet Take 1,000 mg by mouth 2 (two) times daily.     [provider]  metoprolol succinate (TOPROL-XL) 50 MG 24 hr tablet Take 50 mg by mouth daily. Take with or immediately following a meal.    [provider]  Omega-3 Fatty Acids (FISH OIL) 1000 MG CPDR Take 1,000 mg by mouth daily. Omega 3 300 mg    [provider]  potassium chloride (MICRO-K) 10 MEQ CR capsule Take 10 mEq by mouth daily.    [provider]  Vitamin D, Ergocalciferol, (DRISDOL) 50000 units CAPS capsule Take 50,000 Units by mouth every Monday.  07/14/17   [provider]     Family History  Problem Relation Age of Onset   Diabetes Mother    Hypertension Mother    Heart disease Mother    Alzheimer's disease Mother    Diabetes Father    Heart disease Sister    Diabetes Sister     Social History   Socioeconomic History   Marital status: Single    Spouse name: Not on file   Number of children: 3   Years of education: Not on file   Highest education level: Not on file  Occupational History   Occupation: on disability   Occupation: former CNA  Tobacco Use   Smoking status: Former     Current packs/day: 0.00    Average packs/day: 0.5 packs/day for 6.0 years (3.0 ttl pk-yrs)    Types: Cigarettes    Start date: 2013    Quit date: 2019    Years since quitting: 5.6   Smokeless tobacco: Never  Vaping Use   Vaping status: Never Used  Substance and Sexual Activity   Alcohol use: No    Alcohol/week: 6.0 standard drinks of alcohol    Types: 6 Cans of beer per week   Drug use: No   Sexual activity: Not Currently    Birth control/protection: Post-menopausal  Other Topics Concern   Not on file  Social History Narrative   Lives with son, daughter and grandkids in a 2 story home but stays on  the first floor.  Has 3 children.  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   Social Network    Social Network: Not on file     Review of Systems: A 12 point ROS discussed and pertinent positives are indicated in the HPI above.  All other systems are negative.  Review of Systems  Vital Signs: There were no vitals taken for this visit.  Physical Exam       Imaging: No results found.  Labs:  CBC: Recent Labs    04/16/22 1302 07/24/22 1121 12/04/22 1212 01/08/23 1217  WBC 6.7 5.9 6.4 6.5  HGB 12.6 11.8* 12.0 11.9*  HCT 36.4 34.6* 35.8* 35.7*  PLT 260 240 249 241    COAGS: No results for input(s): "INR", "APTT" in the last 8760 hours.  BMP: Recent Labs     04/16/22 1302 07/24/22 1121 12/04/22 1212 01/08/23 1217  NA 142 140 140 141  K 4.0 4.2 4.1 3.9  CL 110 109 109 111  CO2 23 24 24 23   GLUCOSE 111* 103* 107* 77  BUN 11 11 10 14   CALCIUM 9.8 10.1 9.9 9.8  CREATININE 1.16* 0.95 1.14* 1.15*  GFRNONAA 53* >60 54* 53*    LIVER FUNCTION TESTS: Recent Labs    04/16/22 1302 07/24/22 1121 12/04/22 1212 01/08/23 1217  BILITOT 0.5 0.5 0.4 0.4  AST 27 19 22 20   ALT 21 17 22 18   ALKPHOS 74 52 71 77  PROT 7.8 6.7 7.2 7.3  ALBUMIN 4.2 4.0 3.9 4.2    TUMOR MARKERS: Recent Labs    07/24/22 1121  CEA 4.25    Assessment and Plan:  67 y/o F with history of multiple myeloma noted to have increase in M protein and light chains on recent labs concerning for disease progression who presents today for a bone marrow aspiration/biopsy for further evaluation.  Risks and benefits of bone marrow aspiration/biopsy was discussed with the patient and/or patient's family including, but not limited to bleeding, infection, damage to adjacent structures or low yield requiring additional tests.  All of the questions were answered and there is agreement to proceed.  Consent signed and in chart.  Thank you for this interesting consult.  I greatly enjoyed meeting Christine Garrett and look forward to participating in their care.  A copy of this report was sent to the requesting provider on this date.  Electronically Signed: Villa Herb, PA-C 02/14/2023, 11:36 AM   I spent a total of 30 Minutes in face to face in clinical consultation, greater than 50% of which was counseling/coordinating care for multiple myeloma.

## 2023-02-15 ENCOUNTER — Other Ambulatory Visit: Payer: Self-pay | Admitting: Student

## 2023-02-15 DIAGNOSIS — Z01818 Encounter for other preprocedural examination: Secondary | ICD-10-CM

## 2023-02-18 ENCOUNTER — Ambulatory Visit (HOSPITAL_COMMUNITY)
Admission: RE | Admit: 2023-02-18 | Discharge: 2023-02-18 | Disposition: A | Discharge status: Home / Self Care | Payer: Medicare HMO | Source: Ambulatory Visit | Attending: Hematology | Admitting: Hematology

## 2023-02-18 ENCOUNTER — Other Ambulatory Visit: Payer: Self-pay

## 2023-02-18 ENCOUNTER — Encounter (HOSPITAL_COMMUNITY): Payer: Self-pay

## 2023-02-18 DIAGNOSIS — I110 Hypertensive heart disease with heart failure: Secondary | ICD-10-CM | POA: Insufficient documentation

## 2023-02-18 DIAGNOSIS — Z794 Long term (current) use of insulin: Secondary | ICD-10-CM | POA: Diagnosis not present

## 2023-02-18 DIAGNOSIS — Z9484 Stem cells transplant status: Secondary | ICD-10-CM | POA: Insufficient documentation

## 2023-02-18 DIAGNOSIS — Z01812 Encounter for preprocedural laboratory examination: Secondary | ICD-10-CM | POA: Diagnosis present

## 2023-02-18 DIAGNOSIS — Z7984 Long term (current) use of oral hypoglycemic drugs: Secondary | ICD-10-CM | POA: Diagnosis not present

## 2023-02-18 DIAGNOSIS — E1151 Type 2 diabetes mellitus with diabetic peripheral angiopathy without gangrene: Secondary | ICD-10-CM | POA: Diagnosis not present

## 2023-02-18 DIAGNOSIS — C9002 Multiple myeloma in relapse: Secondary | ICD-10-CM | POA: Insufficient documentation

## 2023-02-18 DIAGNOSIS — D72822 Plasmacytosis: Secondary | ICD-10-CM | POA: Diagnosis not present

## 2023-02-18 DIAGNOSIS — I509 Heart failure, unspecified: Secondary | ICD-10-CM | POA: Diagnosis not present

## 2023-02-18 DIAGNOSIS — Z01818 Encounter for other preprocedural examination: Secondary | ICD-10-CM

## 2023-02-18 DIAGNOSIS — C9000 Multiple myeloma not having achieved remission: Secondary | ICD-10-CM | POA: Diagnosis not present

## 2023-02-18 LAB — CBC WITH DIFFERENTIAL/PLATELET
Abs Immature Granulocytes: 0.02 10*3/uL (ref 0.00–0.07)
Basophils Absolute: 0.1 10*3/uL (ref 0.0–0.1)
Basophils Relative: 1 %
Eosinophils Absolute: 0.1 10*3/uL (ref 0.0–0.5)
Eosinophils Relative: 3 %
HCT: 36 % (ref 36.0–46.0)
Hemoglobin: 11.8 g/dL — ABNORMAL LOW (ref 12.0–15.0)
Immature Granulocytes: 0 %
Lymphocytes Relative: 39 %
Lymphs Abs: 1.8 10*3/uL (ref 0.7–4.0)
MCH: 32.2 pg (ref 26.0–34.0)
MCHC: 32.8 g/dL (ref 30.0–36.0)
MCV: 98.1 fL (ref 80.0–100.0)
Monocytes Absolute: 0.4 10*3/uL (ref 0.1–1.0)
Monocytes Relative: 8 %
Neutro Abs: 2.3 10*3/uL (ref 1.7–7.7)
Neutrophils Relative %: 49 %
Platelets: 251 10*3/uL (ref 150–400)
RBC: 3.67 MIL/uL — ABNORMAL LOW (ref 3.87–5.11)
RDW: 14.3 % (ref 11.5–15.5)
WBC: 4.7 10*3/uL (ref 4.0–10.5)
nRBC: 0 % (ref 0.0–0.2)

## 2023-02-18 LAB — GLUCOSE, CAPILLARY: Glucose-Capillary: 144 mg/dL — ABNORMAL HIGH (ref 70–99)

## 2023-02-18 MED ORDER — HEPARIN SOD (PORK) LOCK FLUSH 100 UNIT/ML IV SOLN
500.0000 [IU] | INTRAVENOUS | Status: AC | PRN
  Administered 2023-02-18: 500 [IU]

## 2023-02-18 MED ORDER — MIDAZOLAM HCL 2 MG/2ML IJ SOLN
INTRAMUSCULAR | Status: AC | PRN
Start: 2023-02-18 — End: 2023-02-18
  Administered 2023-02-18: 1 mg via INTRAVENOUS

## 2023-02-18 MED ORDER — FENTANYL CITRATE (PF) 100 MCG/2ML IJ SOLN
INTRAMUSCULAR | Status: AC | PRN
Start: 2023-02-18 — End: 2023-02-18
  Administered 2023-02-18: 50 ug via INTRAVENOUS

## 2023-02-18 MED ORDER — MIDAZOLAM HCL 2 MG/2ML IJ SOLN
INTRAMUSCULAR | Status: AC
  Filled 2023-02-18: qty 2

## 2023-02-18 MED ORDER — SODIUM CHLORIDE 0.9 % IV SOLN: INTRAVENOUS | Status: DC

## 2023-02-18 MED ORDER — HEPARIN SOD (PORK) LOCK FLUSH 100 UNIT/ML IV SOLN
INTRAVENOUS | Status: AC
  Filled 2023-02-18: qty 5

## 2023-02-18 MED ORDER — FENTANYL CITRATE (PF) 100 MCG/2ML IJ SOLN
INTRAMUSCULAR | Status: AC
  Filled 2023-02-18: qty 4

## 2023-02-18 MED ORDER — MIDAZOLAM HCL 2 MG/2ML IJ SOLN
INTRAMUSCULAR | Status: AC | PRN
  Administered 2023-02-18: 1 mg via INTRAVENOUS

## 2023-02-18 MED ORDER — NALOXONE HCL 0.4 MG/ML IJ SOLN
INTRAMUSCULAR | Status: AC
  Filled 2023-02-18: qty 1

## 2023-02-18 MED ORDER — FLUMAZENIL 0.5 MG/5ML IV SOLN
INTRAVENOUS | Status: AC
  Filled 2023-02-18: qty 5

## 2023-02-18 MED ORDER — FENTANYL CITRATE (PF) 100 MCG/2ML IJ SOLN
INTRAMUSCULAR | Status: AC | PRN
  Administered 2023-02-18: 50 ug via INTRAVENOUS

## 2023-02-18 NOTE — Discharge Instructions (Signed)
Discharge Instructions:   Please call Interventional Radiology clinic [phone] with any questions or concerns.  You may remove your dressing and shower tomorrow.    Bone Marrow Aspiration and Bone Marrow Biopsy, Adult, Care After This sheet gives you information about how to care for yourself after your procedure. Your health care provider may also give you more specific instructions. If you have problems or questions, contact your health care provider. What can I expect after the procedure? After the procedure, it is common to have: Mild pain and tenderness. Swelling. Bruising. Follow these instructions at home: Puncture site care  Follow instructions from your health care provider about how to take care of the puncture site. Make sure you: Wash your hands with soap and water before and after you change your bandage (dressing). If soap and water are not available, use hand sanitizer. Change your dressing as told by your health care provider. Check your puncture site every day for signs of infection. Check for: More redness, swelling, or pain. Fluid or blood. Warmth. Pus or a bad smell. Activity Return to your normal activities as told by your health care provider. Ask your health care provider what activities are safe for you. Do not lift anything that is heavier than 10 lb (4.5 kg), or the limit that you are told, until your health care provider says that it is safe. Do not drive for 24 hours if you were given a sedative during your procedure. General instructions  Take over-the-counter and prescription medicines only as told by your health care provider. Do not take baths, swim, or use a hot tub until your health care provider approves. Ask your health care provider if you may take showers. You may only be allowed to take sponge baths. If directed, put ice on the affected area. To do this: Put ice in a plastic bag. Place a towel between your skin and the bag. Leave the ice  on for 20 minutes, 2-3 times a day. Keep all follow-up visits as told by your health care provider. This is important. Contact a health care provider if: Your pain is not controlled with medicine. You have a fever. You have more redness, swelling, or pain around the puncture site. You have fluid or blood coming from the puncture site. Your puncture site feels warm to the touch. You have pus or a bad smell coming from the puncture site. Summary After the procedure, it is common to have mild pain, tenderness, swelling, and bruising. Follow instructions from your health care provider about how to take care of the puncture site and what activities are safe for you. Take over-the-counter and prescription medicines only as told by your health care provider. Contact a health care provider if you have any signs of infection, such as fluid or blood coming from the puncture site. This information is not intended to replace advice given to you by your health care provider. Make sure you discuss any questions you have with your health care provider. Document Revised: 10/20/2018 Document Reviewed: 10/20/2018 Elsevier Patient Education  2023 [employer]   Moderate Conscious Sedation, Adult, Care After This sheet gives you information about how to care for yourself after your procedure. Your health care provider may also give you more specific instructions. If you have problems or questions, contact your health care provider. What can I expect after the procedure? After the procedure, it is common to have: Sleepiness for several hours. Impaired judgment for several hours. Difficulty with balance. Vomiting if   you eat too soon. Follow these instructions at home: For the time period you were told by your health care provider: Rest. Do not participate in activities where you could fall or become injured. Do not drive or use machinery. Do not drink alcohol. Do not take sleeping pills or medicines that  cause drowsiness. Do not make important decisions or sign legal documents. Do not take care of children on your own. Eating and drinking  Follow the diet recommended by your health care provider. Drink enough fluid to keep your urine pale yellow. If you vomit: Drink water, juice, or soup when you can drink without vomiting. Make sure you have little or no nausea before eating solid foods. General instructions Take over-the-counter and prescription medicines only as told by your health care provider. Have a responsible adult stay with you for the time you are told. It is important to have someone help care for you until you are awake and alert. Do not smoke. Keep all follow-up visits as told by your health care provider. This is important. Contact a health care provider if: You are still sleepy or having trouble with balance after 24 hours. You feel light-headed. You keep feeling nauseous or you keep vomiting. You develop a rash. You have a fever. You have redness or swelling around the IV site. Get help right away if: You have trouble breathing. You have new-onset confusion at home. Summary After the procedure, it is common to feel sleepy, have impaired judgment, or feel nauseous if you eat too soon. Rest after you get home. Know the things you should not do after the procedure. Follow the diet recommended by your health care provider and drink enough fluid to keep your urine pale yellow. Get help right away if you have trouble breathing or new-onset confusion at home. This information is not intended to replace advice given to you by your health care provider. Make sure you discuss any questions you have with your health care provider. Document Revised: 10/01/2019 Document Reviewed: 04/29/2019 Elsevier Patient Education  2023 [employer]  

## 2023-02-18 NOTE — Procedures (Signed)
Interventional Radiology Procedure:   Indications: Multiple myeloma  Procedure: CT guided bone marrow biopsy  Findings: 2 aspirates and 1 core from right ilium  Complications: None     EBL: Minimal, less than 10 ml  Plan: Discharge to home in one hour.   Adam R. Anselm Pancoast, MD  Pager: [phone]

## 2023-02-18 NOTE — Progress Notes (Signed)
Spoke with the patient and daughter at 45 late note

## 2023-02-20 LAB — SURGICAL PATHOLOGY

## 2023-02-21 ENCOUNTER — Ambulatory Visit: Payer: Medicare HMO | Admitting: Hematology

## 2023-02-26 ENCOUNTER — Inpatient Hospital Stay: Payer: Medicare HMO | Attending: Hematology

## 2023-02-26 ENCOUNTER — Telehealth: Payer: Self-pay | Admitting: Pharmacist

## 2023-02-26 ENCOUNTER — Encounter: Payer: Self-pay | Admitting: Hematology

## 2023-02-26 ENCOUNTER — Inpatient Hospital Stay: Payer: Medicare HMO

## 2023-02-26 ENCOUNTER — Inpatient Hospital Stay (HOSPITAL_BASED_OUTPATIENT_CLINIC_OR_DEPARTMENT_OTHER): Payer: Medicare HMO | Admitting: Hematology

## 2023-02-26 ENCOUNTER — Telehealth: Payer: Self-pay | Admitting: Pharmacy Technician

## 2023-02-26 ENCOUNTER — Other Ambulatory Visit (HOSPITAL_COMMUNITY): Payer: Self-pay

## 2023-02-26 VITALS — BP 165/84 | HR 93 | Temp 99.0°F | Resp 18 | Wt 223.3 lb

## 2023-02-26 DIAGNOSIS — C9001 Multiple myeloma in remission: Secondary | ICD-10-CM | POA: Diagnosis present

## 2023-02-26 DIAGNOSIS — C9002 Multiple myeloma in relapse: Secondary | ICD-10-CM

## 2023-02-26 DIAGNOSIS — Z95828 Presence of other vascular implants and grafts: Secondary | ICD-10-CM

## 2023-02-26 LAB — CBC WITH DIFFERENTIAL (CANCER CENTER ONLY)
Abs Immature Granulocytes: 0.03 10*3/uL (ref 0.00–0.07)
Basophils Absolute: 0.1 10*3/uL (ref 0.0–0.1)
Basophils Relative: 1 %
Eosinophils Absolute: 0.2 10*3/uL (ref 0.0–0.5)
Eosinophils Relative: 3 %
HCT: 36.6 % (ref 36.0–46.0)
Hemoglobin: 12.2 g/dL (ref 12.0–15.0)
Immature Granulocytes: 0 %
Lymphocytes Relative: 29 %
Lymphs Abs: 2.2 10*3/uL (ref 0.7–4.0)
MCH: 31.8 pg (ref 26.0–34.0)
MCHC: 33.3 g/dL (ref 30.0–36.0)
MCV: 95.3 fL (ref 80.0–100.0)
Monocytes Absolute: 0.5 10*3/uL (ref 0.1–1.0)
Monocytes Relative: 6 %
Neutro Abs: 4.8 10*3/uL (ref 1.7–7.7)
Neutrophils Relative %: 61 %
Platelet Count: 250 10*3/uL (ref 150–400)
RBC: 3.84 MIL/uL — ABNORMAL LOW (ref 3.87–5.11)
RDW: 13.9 % (ref 11.5–15.5)
WBC Count: 7.7 10*3/uL (ref 4.0–10.5)
nRBC: 0 % (ref 0.0–0.2)

## 2023-02-26 LAB — CMP (CANCER CENTER ONLY)
ALT: 15 U/L (ref 0–44)
AST: 19 U/L (ref 15–41)
Albumin: 4.1 g/dL (ref 3.5–5.0)
Alkaline Phosphatase: 64 U/L (ref 38–126)
Anion gap: 7 (ref 5–15)
BUN: 8 mg/dL (ref 8–23)
CO2: 25 mmol/L (ref 22–32)
Calcium: 9.5 mg/dL (ref 8.9–10.3)
Chloride: 108 mmol/L (ref 98–111)
Creatinine: 1.09 mg/dL — ABNORMAL HIGH (ref 0.44–1.00)
GFR, Estimated: 57 mL/min — ABNORMAL LOW (ref >60)
Glucose, Bld: 138 mg/dL — ABNORMAL HIGH (ref 70–99)
Potassium: 4 mmol/L (ref 3.5–5.1)
Sodium: 140 mmol/L (ref 135–145)
Total Bilirubin: 0.6 mg/dL (ref 0.3–1.2)
Total Protein: 7.5 g/dL (ref 6.5–8.1)

## 2023-02-26 MED ORDER — SODIUM CHLORIDE 0.9% FLUSH
10.0000 mL | Freq: Once | INTRAVENOUS | Status: AC
  Administered 2023-02-26: 10 mL

## 2023-02-26 MED ORDER — ZOLEDRONIC ACID 4 MG/100ML IV SOLN
4.0000 mg | Freq: Once | INTRAVENOUS | Status: AC
  Administered 2023-02-26: 4 mg via INTRAVENOUS
  Filled 2023-02-26: qty 100

## 2023-02-26 MED ORDER — AMOXICILLIN-POT CLAVULANATE 875-125 MG PO TABS: 1.0000 | ORAL_TABLET | Freq: Two times a day (BID) | ORAL | 0 refills | Status: AC

## 2023-02-26 MED ORDER — HEPARIN SOD (PORK) LOCK FLUSH 100 UNIT/ML IV SOLN
500.0000 [IU] | Freq: Once | INTRAVENOUS | Status: AC
  Administered 2023-02-26: 500 [IU]

## 2023-02-26 MED ORDER — SODIUM CHLORIDE 0.9 % IV SOLN: Freq: Once | INTRAVENOUS | Status: AC

## 2023-02-26 MED ORDER — LENALIDOMIDE 10 MG PO CAPS
10.0000 mg | ORAL_CAPSULE | Freq: Every day | ORAL | Status: DC
Start: 2023-02-26 — End: 2023-03-07

## 2023-02-26 NOTE — Patient Instructions (Signed)

## 2023-02-26 NOTE — Telephone Encounter (Signed)
Oral Oncology Patient Advocate Encounter  Prior Authorization for lenalidomide has been approved.    PA# [id number] Effective dates: 02/26/23 through 06/17/23  Patients co-pay is $0.    Jinger Neighbors, CPhT-Adv Oncology Pharmacy Patient Advocate [facility] Direct Number: [phone]  Fax: [phone]

## 2023-02-26 NOTE — Progress Notes (Signed)
HEMATOLOGY/ONCOLOGY CLINIC VISIT NOTE  Date of Service: 02/26/23    Patient Care Team: Toma Deiters, MD as PCP - General (Internal Medicine) Glendale Chard, DO as Consulting Physician (Neurology) Kemper Durie, RN as [facility] Care Management  CHIEF COMPLAINTS/PURPOSE OF CONSULTATION:   Follow-up for continued evaluation and management of multiple myeloma  Oncologic History:  67 y.o. female who initially presented with significant weight loss in the context of H. pylori-associated gastritis and alcohol abuse. At the same time, an incidental discovery of a lower thoracic vertebral mass in the context of chronic numbness and weakness in the lower extremities. MRI of the thoracic spine demonstrated a destructive lesion at T8 level and patient underwent surgical treatment. 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 Staging evaluation conducted by our clinic confirmed presence of IgG kappa multiple myeloma. Cytogenetics studies demonstrated normal karyotype, FISH studies are positive for 13q34 & D13S319 loss and no loss of p53. Based on initial information, patient was staged at R-ISS II.    Patient has completed 6 cycles of induction systemic chemotherapy with lenalidomide, bortezomib, and low-dose dexamethasone.  Repeat bone marrow biopsy demonstrated excellent response to treatment and patient subsequently underwent consolidation therapy including melphalan conditioning and autologous stem cell rescue.  Repeat assessment with PET/CT and bone marrow biopsy on 10/13/2017 demonstrates stringent complete response maintenance.  HISTORY OF PRESENTING ILLNESS:  Please see previous note for details on initial presentation  INTERVAL HISTORY:   Christine Garrett is a 67 y.o. female here for continued evaluation and management of her multiple myeloma. Patient was last connected with via telemedicine visit on 01/23/2023 and reported continued loose  stools. She was otherwise doing well overall.   Today, she complains of worsened hip pain, and denies any back pain or abdominal pain. Patient reports that she developed a cold recently. She endorsed throat soreness yesterday which has since improved. Patient currently has a cough with clear phlegm. She denies any fever or loss of taste.   She reports numbness related to neuropathy which is manageable. She takes Tylenol once a week. She does not take Gabapentin at this time as it does not improve symptoms.  She continue to be on blood thinners. Patient previously took 5 MG Revlimid which she has tolerated well in the past with no severe toxicities. She does note endorsing some nausea with Revlimid in the past.   Patient endorses some mild sinus tenderness in her left sinus. She reports that she previously thought she endorsed a left-sided sinus infection with thick yellow phlegm, which she does not have at this time.   MEDICAL HISTORY:  Past Medical History:  Diagnosis Date   Arthritis    knees   Cancer (HCC)    Carpal tunnel syndrome    CHF (congestive heart failure) (HCC)    Diabetes mellitus    type 2   GERD (gastroesophageal reflux disease)    Heart murmur    "Little, No concerns" per Dr  Andee Lineman pt reported.   Hypertension    controlled using a guided approch with plasma renin activity   Multiple myeloma not having achieved remission (HCC) 01/06/2017   Neuropathy    Peripheral vascular disease (HCC)    Sleep apnea    01/03/2020: per patient, hasn't used CPAP machine in two years due to inability to breathe well with it on    SURGICAL HISTORY: Past Surgical History:  Procedure Laterality Date   BIOPSY  11/28/2016   Procedure: BIOPSY;  Surgeon: Malissa Hippo, MD;  [facility];  Service: Endoscopy;;  gastric   BIOPSY  07/02/2019   Procedure: BIOPSY;  Surgeon: Malissa Hippo, MD;  [facility];  Service: Endoscopy;;   BTL     1982   CERVICAL CONE  BIOPSY     cervical lesion   COLONOSCOPY WITH PROPOFOL N/A 07/02/2019   Procedure: COLONOSCOPY WITH PROPOFOL;  Surgeon: Malissa Hippo, MD;  [facility];  Service: Endoscopy;  Laterality: N/A;  12:40 - pt can't come earlier due to transportation   ESOPHAGOGASTRODUODENOSCOPY (EGD) WITH PROPOFOL N/A 11/28/2016   Procedure: ESOPHAGOGASTRODUODENOSCOPY (EGD) WITH PROPOFOL;  Surgeon: Malissa Hippo, MD;  [facility];  Service: Endoscopy;  Laterality: N/A;  9:25   IR FLUORO GUIDE PORT INSERTION RIGHT  01/29/2017   IR US GUIDE VASC ACCESS RIGHT  01/29/2017   lipoma removal     right shoulder 2001   LUMBAR FUSION  01/05/2020    Lumbar Two-Three Lumbar Three-Four Lumbar Four-Five Posterior lumbar interbody fusion with decompression (N/A Spine Lumbar)   MULTIPLE EXTRACTIONS WITH ALVEOLOPLASTY Bilateral 08/24/2012   Procedure: MULTIPLE EXTRACTION WITH ALVEOLOPLASTY BIOPSY OF RIGHT AND LEFT MANDIBLE ;  Surgeon: Georgia Lopes, DDS;  Location: [facility];  Service: Oral Surgery;  Laterality: Bilateral;   POLYPECTOMY  07/02/2019   Procedure: POLYPECTOMY;  Surgeon: Malissa Hippo, MD;  [facility];  Service: Endoscopy;;   POSTERIOR LUMBAR FUSION 4 LEVEL N/A 12/26/2016   Procedure: THORACIC EIGHT TUMOR RESECTION, THORACIC SIX- THORACIC TEN POSTERIOR SPINAL FUSION;  Surgeon: Coletta Memos, MD;  Location: [facility];  Service: Neurosurgery;  Laterality: N/A;  THORACIC 8 TUMOR RESECTION, THORACIC 6- THORACIC 10 POSTERIOR SPINAL FUSION   ROTATOR CUFF REPAIR     right shoulder   TUBAL LIGATION      SOCIAL HISTORY: Social History   Socioeconomic History   Marital status: Single    Spouse name: Not on file   Number of children: 3   Years of education: Not on file   Highest education level: Not on file  Occupational History   Occupation: on disability   Occupation: former CNA  Tobacco Use   Smoking status: Former    Current packs/day: 0.00    Average packs/day: 0.5 packs/day for  6.0 years (3.0 ttl pk-yrs)    Types: Cigarettes    Start date: 2013    Quit date: 2019    Years since quitting: 5.6   Smokeless tobacco: Never  Vaping Use   Vaping status: Never Used  Substance and Sexual Activity   Alcohol use: No    Alcohol/week: 6.0 standard drinks of alcohol    Types: 6 Cans of beer per week   Drug use: No   Sexual activity: Not Currently    Birth control/protection: Post-menopausal  Other Topics Concern   Not on file  Social History Narrative   Lives with son, daughter and grandkids in a 2 story home but stays on the first floor.  Has 3 children.  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      Right handed   Social Determinants of Health   Financial Resource Strain: Not on file  Food Insecurity: No Food Insecurity (01/30/2022)   Hunger Vital Sign    Worried About Running Out of Food in the Last Year: Never true    Ran Out of Food in the Last Year: Never true  Transportation Needs: No Transportation Needs (01/30/2022)   PRAPARE - Administrator, Civil Service (Medical): No    Lack of Transportation (Non-Medical): No  Physical Activity: Not on file  Stress: Not on file  Social Connections: Unknown (05/21/2022)   Received from [facility]   Social Network    Social Network: Not on file  Intimate Partner Violence: Unknown (05/21/2022)   Received from [facility]   HITS    Physically Hurt: Not on file    Insult or Talk Down To: Not on file    Threaten Physical Harm: Not on file    Scream or Curse: Not on file    FAMILY HISTORY: Family History  Problem Relation Age of Onset   Diabetes Mother    Hypertension Mother    Heart disease Mother    Alzheimer's disease Mother    Diabetes Father    Heart disease Sister    Diabetes Sister     ALLERGIES:  is allergic to hydrocodone.  MEDICATIONS:  Current Outpatient Medications  Medication Sig Dispense Refill   amLODipine (NORVASC) 5  MG tablet Take 5 mg by mouth daily.     apixaban (ELIQUIS) 5 MG TABS tablet Take 1 tablet (5 mg total) by mouth 2 (two) times daily.     atorvastatin (LIPITOR) 40 MG tablet Take 40 mg by mouth daily.     enalapril (VASOTEC) 20 MG tablet Take 20 mg by mouth 2 (two) times daily.      gabapentin (NEURONTIN) 300 MG capsule TAKE 2 CAPSULES (600 MG TOTAL) BY MOUTH 3 (THREE) TIMES DAILY. 540 capsule 0   hydrALAZINE (APRESOLINE) 25 MG tablet Take 25 mg by mouth 2 (two) times daily.     insulin degludec (TRESIBA FLEXTOUCH) 100 UNIT/ML SOPN FlexTouch Pen Inject 0.5 mLs (50 Units total) into the skin daily at 10 pm. (Patient taking differently: Inject 40 Units into the skin daily at 10 pm.) 5 pen 2   Insulin Pen Needle (B-D ULTRAFINE III SHORT PEN) 31G X 8 MM MISC 1 each by Does not apply route as directed. 100 each 3   metFORMIN (GLUCOPHAGE) 1000 MG tablet Take 1,000 mg by mouth 2 (two) times daily.      metoprolol succinate (TOPROL-XL) 50 MG 24 hr tablet Take 50 mg by mouth daily. Take with or immediately following a meal.     Omega-3 Fatty Acids (FISH OIL) 1000 MG CPDR Take 1,000 mg by mouth daily. Omega 3 300 mg     potassium chloride (MICRO-K) 10 MEQ CR capsule Take 10 mEq by mouth daily.     Vitamin D, Ergocalciferol, (DRISDOL) 50000 units CAPS capsule Take 50,000 Units by mouth every Monday.      No current facility-administered medications for this visit.    REVIEW OF SYSTEMS:    10 Point review of Systems was done is negative except as noted above.   PHYSICAL EXAMINATION:  .BP (!) 165/84 (BP Location: Right Arm, Patient Position: Sitting) Comment: Second Blood Pressure Check  Pulse 93   Temp 99 F (37.2 C) (Oral)   Resp 18   Wt 223 lb 4.8 oz (101.3 kg)   SpO2 100%   BMI 32.98 kg/m   GENERAL:alert, in no acute distress and comfortable SKIN: no acute rashes, no significant lesions EYES: conjunctiva are pink and non-injected, sclera anicteric OROPHARYNX: MMM, no exudates, no  oropharyngeal erythema or ulceration NECK: supple, no JVD LYMPH:  no palpable lymphadenopathy in the cervical,  axillary or inguinal regions LUNGS: clear to auscultation b/l with normal respiratory effort HEART: regular rate & rhythm ABDOMEN:  normoactive bowel sounds , non tender, not distended. Extremity: no pedal edema PSYCH: alert & oriented x 3 with fluent speech NEURO: no focal motor/sensory deficits    LABORATORY DATA:  I have reviewed the data as listed      Latest Ref Rng & Units 02/26/2023   12:11 PM 02/18/2023    7:53 AM 01/08/2023   12:17 PM  CBC  WBC 4.0 - 10.5 K/uL 7.7  4.7  6.5   Hemoglobin 12.0 - 15.0 g/dL 65.7  84.6  96.2   Hematocrit 36.0 - 46.0 % 36.6  36.0  35.7   Platelets 150 - 400 K/uL 250  251  241     .    Latest Ref Rng & Units 02/26/2023   12:11 PM 01/08/2023   12:17 PM 12/04/2022   12:12 PM  CMP  Glucose 70 - 99 mg/dL 952  77  841   BUN 8 - 23 mg/dL 8  14  10    Creatinine 0.44 - 1.00 mg/dL 3.24  4.01  0.27   Sodium 135 - 145 mmol/L 140  141  140   Potassium 3.5 - 5.1 mmol/L 4.0  3.9  4.1   Chloride 98 - 111 mmol/L 108  111  109   CO2 22 - 32 mmol/L 25  23  24    Calcium 8.9 - 10.3 mg/dL 9.5  9.8  9.9   Total Protein 6.5 - 8.1 g/dL 7.5  7.3  7.2   Total Bilirubin 0.3 - 1.2 mg/dL 0.6  0.4  0.4   Alkaline Phos 38 - 126 U/L 64  77  71   AST 15 - 41 U/L 19  20  22    ALT 0 - 44 U/L 15  18  22     Bone marrow biopsy 02/18/2023:    03/25/18 SPEP and SFLC:     04/21/17 Cytogenetics:    01/17/17 Cytogenetics:    RADIOGRAPHIC STUDIES: I have personally reviewed the radiological images as listed and agreed with the findings in the report.  CT BONE MARROW BIOPSY & ASPIRATION  Result Date: 02/18/2023 INDICATION: 67 year old with multiple myeloma.  Concern for myeloma relapse. EXAM: CT GUIDED BONE MARROW ASPIRATES AND BIOPSY Physician: Rachelle Hora. Henn, MD MEDICATIONS: Moderate sedation ANESTHESIA/SEDATION: Moderate (conscious) sedation was employed during  this procedure. A total of Versed 2mg  and fentanyl 200 mcg was administered intravenously at the order of the provider performing the procedure. Total intra-service moderate sedation time: 15 minutes. Patient's level of consciousness and vital signs were monitored continuously by radiology nurse throughout the procedure under the supervision of the provider performing the procedure. COMPLICATIONS: None immediate. PROCEDURE: The procedure was explained to the patient. The risks and benefits of the procedure were discussed and the patient's questions were addressed. Informed consent was obtained from the patient. The patient was placed prone on CT table. Images of the pelvis were obtained. The right side of back was prepped and draped in sterile fashion. The skin and right posterior ilium were anesthetized with 1% lidocaine. 11 gauge bone needle was directed into the right ilium with CT guidance. Two aspirates and one core biopsy were obtained. Unable to extract the core biopsy from the 11 gauge needle. Therefore, OnControl needle was directed into the right ilium with CT guidance. Follow up CT images were obtained to confirm placement in the bone. Single core biopsy was obtained  with the electric drill. Bandage placed over the puncture site. RADIATION DOSE REDUCTION: This exam was performed according to the departmental dose-optimization program which includes automated exposure control, adjustment of the mA and/or kV according to patient size and/or use of iterative reconstruction technique. IMPRESSION: CT guided bone marrow aspiration and core biopsy. Electronically Signed   By: Richarda Overlie M.D.   On: 02/18/2023 12:28   NM PET Image Restage (PS) Whole Body  Result Date: 02/15/2023 CLINICAL DATA:  Subsequent treatment strategy for multiple myeloma. EXAM: NUCLEAR MEDICINE PET WHOLE BODY TECHNIQUE: 11.0 mCi F-18 FDG was injected intravenously. Full-ring PET imaging was performed from the head to foot after the  radiotracer. CT data was obtained and used for attenuation correction and anatomic localization. Fasting blood glucose: 142 mg/dl COMPARISON:  CT abdomen/pelvis dated 02/20/2021. CT chest dated 11/02/2020. PET-CT dated 04/15/2017. FINDINGS: Mediastinal blood pool activity: SUV max 3.4 HEAD/NECK: 11 mm lesion along the anterior wall of the right maxillary sinus (series 4/image 36), max SUV 16.2. No metabolic cervical lymphadenopathy. Incidental CT findings: Partial opacification of the bilateral maxillary sinuses, left greater than right. CHEST: No hypermetabolic thoracic lymphadenopathy. No suspicious pulmonary nodules. Right chest port terminates at the cavoatrial junction. Incidental CT findings: Atherosclerotic calcifications of the aortic arch. Moderate three-vessel coronary atherosclerosis. ABDOMEN/PELVIS: No abnormal hypermetabolism in the liver, spleen, pancreas, or adrenal glands. No hypermetabolic abdominopelvic lymphadenopathy. Incidental CT findings: Cholelithiasis, without associated inflammatory changes. Atherosclerotic calcifications of the abdominal aorta and branch vessels. SKELETON: No focal hypermetabolic activity to suggest skeletal metastasis. No lytic lesions on CT. Incidental CT findings: Degenerative changes of the visualized thoracolumbar spine. T7-11 posterior lumbar fixation. L3-S1 posterior lumbar fixation. Degenerative changes of the bilateral hips. EXTREMITIES: No abnormal hypermetabolic activity in the lower extremities. Incidental CT findings: none IMPRESSION: 11 mm hypermetabolic osseous lesion along the anterior wall of the right maxillary sinus, suspicious for active myeloma. Electronically Signed   By: Charline Bills M.D.   On: 02/15/2023 23:49    10/13/17 PET/CT     ASSESSMENT & PLAN:   67 y.o. female with  1. Multiple myeloma Currently in remission Autologous PBSCT on 07/01/17. IgG kappa multiple myeloma. Cytogenetics studies demonstrated normal karyotype, FISH  studies are positive for 13q34 & D13S319 loss and no loss of p53. Based on initial information, patient was staged at R-ISS II.  Completed 6 cycles of induction with Revlimid, Velcade and dexamethasone  12/09/2018 lumbar spine xray with results revealing "No acute osseous abnormality. Extensive multilevel degenerative changes are noted throughout the lumbar spine."  12/09/2018 sacrum and coccyx xray with results revealing "No acute osseous abnormality. Degenerative changes are noted of both hips."  2. Grade 2 neuropathy -controlled  02/26/18 NCV with EMG which revealed The electrophysiologic findings are most consistent with a chronic, length-dependent sensorimotor axonal and demyelinating polyneuropathy affecting the right side. Overall, these findings are moderate to severe in degree electrically. Incidentally, there is a right Martin-Gruber anastomosis, a normal variant.   3. Rt LE calf veins - possible clot ?chronic.  01/29/18 VAS [place] BLE which revealed Right: Ultrasound is unable to distinguish whether obstruction in the Peroneal veins, and great saphenous vein, and superficial veins/varciosities is acute or chronic. No cystic structure found in the popliteal fossa. Left: no evidence of DVT.   PLAN:  -discussed results of PET scan on 02/10/2023 which showed 11 mm hypermetabolic osseous lesion along the anterior wall of the right maxillary sinus, suspicious for active myeloma -Discussed lab results on 02/26/23 in detail with  patient. CBC showed WBC of 7.7K, hemoglobin of 12.2, and platelets of 250K. -myeloma panel on 01/08/2023 showed M protein of 0.3 g/dL -bone marrow biopsy on 02/18/2023 showed early recurrence in the bone marrow. There were findings of  8% plasma cells with a mix of kappa and lambda light chains. This does not appear to be an overtly clonal process and findings are not specific for overt myeloma in the bone marrow.  -discussed option of starting Revlimid then considering  radiation therapy for any persistent/symptomatic bone lesions -patient received a transplant in 2019 was was previously on 5 MG Revlimid for two years. She has since been off of Revlimid for two years.  -Will start patient back on maintenance Revlimid treatment at 10 MG. Patient is already on Eliquis for VTE prophylaxis especially since he has a history of previous DVT. -will order nausea medications -discussed option of cymbalta to manage neuropathy -will order a short course of Augmentin to cover for any bacterial infection due to her URI and sinus symptoms. Would also recommend salt and baking soda gargles  -proceed with Zometa today -Continue Eliquis to address risk for repeat blood clots -hip pain may be related to arthritis or potential back issues. Not likely related to myeloma at this time -will plan for a CT scan in a few months to monitor her maxillary bone lesions -Myeloma FISH panel testing to evaluate for any early myeloma on the bone marrow is currently pending. -will continue to monitor with labs in 6 weeks -advised patient to let us know if she expereinces any worsened sinus pain  FOLLOW-UP: Return to clinic with Dr. Candise Che with labs in 6 weeks  The total time spent in the appointment was 33 minutes* .  All of the patient's questions were answered with apparent satisfaction. The patient knows to call the clinic with any problems, questions or concerns.   Wyvonnia Lora MD MS AAHIVMS [facility] Hematology/Oncology Physician [facility]  .*Total Encounter Time as defined by the Centers for Medicare and Medicaid Services includes, in addition to the face-to-face time of a patient visit (documented in the note above) non-face-to-face time: obtaining and reviewing outside history, ordering and reviewing medications, tests or procedures, care coordination (communications with other health care professionals or caregivers) and documentation in the medical record.      I,Mitra Faeizi,acting as a Neurosurgeon for Wyvonnia Lora, MD.,have documented all relevant documentation on the behalf of Wyvonnia Lora, MD,as directed by  Wyvonnia Lora, MD while in the presence of Wyvonnia Lora, MD.  .I have reviewed the above documentation for accuracy and completeness, and I agree with the above. [name] MD

## 2023-02-26 NOTE — Telephone Encounter (Signed)
Oral Oncology Patient Advocate Encounter   Received notification that prior authorization for lenalidomide is required.   PA submitted on 02/26/23 Key [id number] Status is pending     Jinger Neighbors, CPhT-Adv Oncology Pharmacy Patient Advocate [facility] Direct Number: [phone]  Fax: [phone]

## 2023-02-26 NOTE — Telephone Encounter (Addendum)
Oral Oncology Pharmacist Encounter  Received new prescription for Revlimid (lenalidomide) for the treatment of multiple myeloma, planned duration until disease progression or unacceptable drug toxicity.  CBC w/ Diff and CMP from 02/26/23 assessed, no relevant lab abnormalities requiring baseline dose adjustment required at this time. Prescription dose and frequency assessed for appropriateness.  Current medication list in Epic reviewed, no relevant/significant DDIs with Revlimid identified.  Evaluated chart and no patient barriers to medication adherence noted.   Once [employer] auth # is obtained, prescription will need to be e-scribed to [facility] for dispensing.   Oral Oncology Clinic will continue to follow for insurance authorization, copayment issues, initial counseling and start date.  Lenord Carbo, PharmD, BCPS, [facility] Hematology/Oncology Clinical Pharmacist [facility] Oral Chemotherapy Navigation Clinics [phone] 02/26/2023 2:48 PM

## 2023-02-27 DIAGNOSIS — H25813 Combined forms of age-related cataract, bilateral: Secondary | ICD-10-CM | POA: Diagnosis not present

## 2023-02-27 LAB — KAPPA/LAMBDA LIGHT CHAINS
Kappa free light chain: 44.7 mg/L — ABNORMAL HIGH (ref 3.3–19.4)
Kappa, lambda light chain ratio: 1.97 — ABNORMAL HIGH (ref 0.26–1.65)
Lambda free light chains: 22.7 mg/L (ref 5.7–26.3)

## 2023-03-02 LAB — MULTIPLE MYELOMA PANEL, SERUM
Albumin SerPl Elph-Mcnc: 3.6 g/dL (ref 2.9–4.4)
Albumin/Glob SerPl: 1.1 (ref 0.7–1.7)
Alpha 1: 0.2 g/dL (ref 0.0–0.4)
Alpha2 Glob SerPl Elph-Mcnc: 0.8 g/dL (ref 0.4–1.0)
B-Globulin SerPl Elph-Mcnc: 1.1 g/dL (ref 0.7–1.3)
Gamma Glob SerPl Elph-Mcnc: 1.3 g/dL (ref 0.4–1.8)
Globulin, Total: 3.4 g/dL (ref 2.2–3.9)
IgA: 232 mg/dL (ref 87–352)
IgG (Immunoglobin G), Serum: 1465 mg/dL (ref 586–1602)
IgM (Immunoglobulin M), Srm: 39 mg/dL (ref 26–217)
M Protein SerPl Elph-Mcnc: 0.3 g/dL — ABNORMAL HIGH
Total Protein ELP: 7 g/dL (ref 6.0–8.5)

## 2023-03-03 DIAGNOSIS — H25812 Combined forms of age-related cataract, left eye: Secondary | ICD-10-CM | POA: Diagnosis not present

## 2023-03-04 ENCOUNTER — Encounter: Payer: Self-pay | Admitting: Hematology

## 2023-03-04 ENCOUNTER — Encounter (HOSPITAL_COMMUNITY): Payer: Self-pay

## 2023-03-04 MED ORDER — ONDANSETRON HCL 8 MG PO TABS: 8.0000 mg | ORAL_TABLET | Freq: Three times a day (TID) | ORAL | 0 refills | Status: DC | PRN

## 2023-03-05 ENCOUNTER — Encounter (HOSPITAL_COMMUNITY): Payer: Self-pay

## 2023-03-05 NOTE — H&P (Signed)
Surgical History & Physical  Patient Name: Christine Garrett  DOB: [birth year]  Surgery: Cataract extraction with intraocular lens implant phacoemulsification; Left Eye Surgeon: Fabio Pierce MD Surgery Date: 03/10/2023 Pre-Op Date: 02/27/2023  HPI: A 33 Yr. old female patient present for cataract eval per Dr. Daphine Deutscher. 1.  The patient complains of difficulty when driving, mostly at night due to glare and halos which began a few years ago and has gradually progressed. Both eyes are affected, OS>OD. This is negatively affecting the patient's quality of life and the patient is unable to function adequately in life with the current level of vision. Patient only wears readers currently. HPI Completed by Dr. Fabio Pierce  Medical History: Cataracts ERM OU  Cancer Diabetes High Blood Pressure LDL  Review of Systems Allergic/Immunologic Seasonal Allergies Cardiovascular High Blood Pressure Endocrine diabetic All recorded systems are negative except as noted above.  Social Former smoker  Medication metformin ,  Evaristo Bury FlexTouch U-100 ,  hydralazine ,  gabapentin ,  ergocalciferol (vitamin D2) ,  enalapril maleate ,  atorvastatin ,  amlodipine ,  metoprolol succinate ,  apixaban   Sx/Procedures Back Surgery, Tubal Ligation  Drug Allergies  oxycodone   History & Physical: Heent: cataract NECK: supple without bruits LUNGS: lungs clear to auscultation CV: regular rate and rhythm Abdomen: soft and non-tender  Impression & Plan: Assessment: 1.  COMBINED FORMS AGE RELATED CATARACT; Both Eyes (H25.813) 2.  ASTIGMATISM, REGULAR; Both Eyes (H52.223)  Plan: 1.  Cataract accounts for the patient's decreased vision. This visual impairment is not correctable with a tolerable change in glasses or contact lenses. Cataract surgery with an implantation of a new lens should significantly improve the visual and functional status of the patient. Discussed all risks, benefits, alternatives, and  potential complications. Discussed the procedures and recovery. Patient desires to have surgery. A-scan ordered and performed today for intra-ocular lens calculations. The surgery will be performed in order to improve vision for driving, reading, and for eye examinations. Recommend phacoemulsification with intra-ocular lens. Recommend Dextenza for post-operative pain and inflammation. Left Eye worse - first. Dilates poorly - shugarcaine by protocol. Malyugin Ring. Omidira. recommend Toric Lens. left eye only.  2.  Recommend toric IOL OS only.

## 2023-03-06 ENCOUNTER — Encounter (HOSPITAL_COMMUNITY): Payer: Self-pay

## 2023-03-06 ENCOUNTER — Encounter (HOSPITAL_COMMUNITY)
Admission: RE | Admit: 2023-03-06 | Discharge: 2023-03-06 | Disposition: A | Discharge status: Home / Self Care | Payer: Medicare HMO | Source: Ambulatory Visit | Attending: Ophthalmology | Admitting: Ophthalmology

## 2023-03-06 ENCOUNTER — Other Ambulatory Visit: Payer: Self-pay

## 2023-03-07 ENCOUNTER — Other Ambulatory Visit: Payer: Self-pay

## 2023-03-07 DIAGNOSIS — C9001 Multiple myeloma in remission: Secondary | ICD-10-CM

## 2023-03-07 MED ORDER — LENALIDOMIDE 10 MG PO CAPS: 10.0000 mg | ORAL_CAPSULE | Freq: Every day | ORAL | Status: DC

## 2023-03-10 ENCOUNTER — Ambulatory Visit (HOSPITAL_COMMUNITY)
Admission: RE | Admit: 2023-03-10 | Discharge: 2023-03-10 | Disposition: A | Discharge status: Home / Self Care | Payer: Medicare HMO | Source: Ambulatory Visit | Attending: Ophthalmology | Admitting: Ophthalmology

## 2023-03-10 ENCOUNTER — Encounter (HOSPITAL_COMMUNITY)
Admission: RE | Disposition: A | Discharge status: Home / Self Care | Payer: Self-pay | Source: Ambulatory Visit | Attending: Ophthalmology

## 2023-03-10 ENCOUNTER — Ambulatory Visit (HOSPITAL_COMMUNITY): Payer: Medicare HMO | Admitting: Certified Registered"

## 2023-03-10 ENCOUNTER — Encounter (HOSPITAL_COMMUNITY): Payer: Self-pay | Admitting: Ophthalmology

## 2023-03-10 ENCOUNTER — Ambulatory Visit (HOSPITAL_BASED_OUTPATIENT_CLINIC_OR_DEPARTMENT_OTHER): Payer: Medicare HMO | Admitting: Certified Registered"

## 2023-03-10 DIAGNOSIS — H2512 Age-related nuclear cataract, left eye: Secondary | ICD-10-CM

## 2023-03-10 DIAGNOSIS — G4730 Sleep apnea, unspecified: Secondary | ICD-10-CM | POA: Diagnosis not present

## 2023-03-10 DIAGNOSIS — Z7984 Long term (current) use of oral hypoglycemic drugs: Secondary | ICD-10-CM | POA: Diagnosis not present

## 2023-03-10 DIAGNOSIS — H259 Unspecified age-related cataract: Secondary | ICD-10-CM | POA: Diagnosis not present

## 2023-03-10 DIAGNOSIS — Z794 Long term (current) use of insulin: Secondary | ICD-10-CM | POA: Insufficient documentation

## 2023-03-10 DIAGNOSIS — I739 Peripheral vascular disease, unspecified: Secondary | ICD-10-CM | POA: Diagnosis not present

## 2023-03-10 DIAGNOSIS — I5032 Chronic diastolic (congestive) heart failure: Secondary | ICD-10-CM

## 2023-03-10 DIAGNOSIS — E1136 Type 2 diabetes mellitus with diabetic cataract: Secondary | ICD-10-CM | POA: Diagnosis not present

## 2023-03-10 DIAGNOSIS — I110 Hypertensive heart disease with heart failure: Secondary | ICD-10-CM

## 2023-03-10 DIAGNOSIS — Z87891 Personal history of nicotine dependence: Secondary | ICD-10-CM

## 2023-03-10 DIAGNOSIS — H52223 Regular astigmatism, bilateral: Secondary | ICD-10-CM | POA: Diagnosis not present

## 2023-03-10 DIAGNOSIS — H25813 Combined forms of age-related cataract, bilateral: Secondary | ICD-10-CM | POA: Insufficient documentation

## 2023-03-10 DIAGNOSIS — H2181 Floppy iris syndrome: Secondary | ICD-10-CM | POA: Insufficient documentation

## 2023-03-10 DIAGNOSIS — I509 Heart failure, unspecified: Secondary | ICD-10-CM | POA: Diagnosis not present

## 2023-03-10 DIAGNOSIS — H25812 Combined forms of age-related cataract, left eye: Secondary | ICD-10-CM | POA: Diagnosis not present

## 2023-03-10 DIAGNOSIS — I2510 Atherosclerotic heart disease of native coronary artery without angina pectoris: Secondary | ICD-10-CM | POA: Diagnosis not present

## 2023-03-10 HISTORY — PX: CATARACT EXTRACTION W/PHACO: SHX586

## 2023-03-10 LAB — GLUCOSE, CAPILLARY: Glucose-Capillary: 143 mg/dL — ABNORMAL HIGH (ref 70–99)

## 2023-03-10 SURGERY — PHACOEMULSIFICATION, CATARACT, WITH IOL INSERTION
Anesthesia: Monitor Anesthesia Care | Site: Eye | Laterality: Left

## 2023-03-10 MED ORDER — TROPICAMIDE 1 % OP SOLN
1.0000 [drp] | OPHTHALMIC | Status: AC | PRN
  Administered 2023-03-10 (×3): 1 [drp] via OPHTHALMIC

## 2023-03-10 MED ORDER — EPINEPHRINE PF 1 MG/ML IJ SOLN
INTRAMUSCULAR | Status: AC
  Filled 2023-03-10: qty 1

## 2023-03-10 MED ORDER — PHENYLEPHRINE HCL 2.5 % OP SOLN
1.0000 [drp] | OPHTHALMIC | Status: AC | PRN
  Administered 2023-03-10 (×3): 1 [drp] via OPHTHALMIC

## 2023-03-10 MED ORDER — SODIUM CHLORIDE 0.9% FLUSH
INTRAVENOUS | Status: DC | PRN
Start: 2023-03-10 — End: 2023-03-10
  Administered 2023-03-10: 5 mL via INTRAVENOUS

## 2023-03-10 MED ORDER — MOXIFLOXACIN HCL 5 MG/ML IO SOLN
INTRAOCULAR | Status: DC | PRN
  Administered 2023-03-10: .3 mL via OPHTHALMIC

## 2023-03-10 MED ORDER — POVIDONE-IODINE 5 % OP SOLN
OPHTHALMIC | Status: DC | PRN
  Administered 2023-03-10: 1 via OPHTHALMIC

## 2023-03-10 MED ORDER — MIDAZOLAM HCL 2 MG/2ML IJ SOLN
INTRAMUSCULAR | Status: AC
  Filled 2023-03-10: qty 2

## 2023-03-10 MED ORDER — SODIUM HYALURONATE 23MG/ML IO SOSY
PREFILLED_SYRINGE | INTRAOCULAR | Status: DC | PRN
  Administered 2023-03-10: .6 mL via INTRAOCULAR

## 2023-03-10 MED ORDER — LIDOCAINE HCL (PF) 1 % IJ SOLN
INTRAOCULAR | Status: DC | PRN
  Administered 2023-03-10: 1 mL via OPHTHALMIC

## 2023-03-10 MED ORDER — BSS IO SOLN
INTRAOCULAR | Status: DC | PRN
  Administered 2023-03-10: 15 mL via INTRAOCULAR

## 2023-03-10 MED ORDER — STERILE WATER FOR IRRIGATION IR SOLN
Status: DC | PRN
  Administered 2023-03-10: 25 mL

## 2023-03-10 MED ORDER — MIDAZOLAM HCL 2 MG/2ML IJ SOLN
INTRAMUSCULAR | Status: DC | PRN
  Administered 2023-03-10: 1.5 mg via INTRAVENOUS

## 2023-03-10 MED ORDER — MOXIFLOXACIN HCL 5 MG/ML IO SOLN
INTRAOCULAR | Status: AC
  Filled 2023-03-10: qty 1

## 2023-03-10 MED ORDER — SODIUM HYALURONATE 10 MG/ML IO SOLUTION
PREFILLED_SYRINGE | INTRAOCULAR | Status: DC | PRN
  Administered 2023-03-10: .85 mL via INTRAOCULAR

## 2023-03-10 MED ORDER — PHENYLEPHRINE-KETOROLAC 1-0.3 % IO SOLN
INTRAOCULAR | Status: AC
  Filled 2023-03-10: qty 4

## 2023-03-10 MED ORDER — PHENYLEPHRINE-KETOROLAC 1-0.3 % IO SOLN
INTRAOCULAR | Status: DC | PRN
  Administered 2023-03-10: 500 mL via OPHTHALMIC

## 2023-03-10 MED ORDER — LIDOCAINE HCL 3.5 % OP GEL
1.0000 | Freq: Once | OPHTHALMIC | Status: AC
  Administered 2023-03-10: 1 via OPHTHALMIC

## 2023-03-10 MED ORDER — TETRACAINE HCL 0.5 % OP SOLN
1.0000 [drp] | OPHTHALMIC | Status: AC | PRN
  Administered 2023-03-10 (×3): 1 [drp] via OPHTHALMIC

## 2023-03-10 SURGICAL SUPPLY — 15 items
CATARACT SUITE SIGHTPATH (MISCELLANEOUS) ×1
CLOTH BEACON ORANGE TIMEOUT ST (SAFETY) ×1 IMPLANT
EYE SHIELD UNIVERSAL CLEAR (GAUZE/BANDAGES/DRESSINGS) IMPLANT
FEE CATARACT SUITE SIGHTPATH (MISCELLANEOUS) ×1 IMPLANT
GLOVE BIOGEL PI IND STRL 6.5 (GLOVE) IMPLANT
GLOVE BIOGEL PI IND STRL 7.0 (GLOVE) ×2 IMPLANT
LENS IOL TECNIS EYHANCE 18.0 (Intraocular Lens) IMPLANT
NDL HYPO 18GX1.5 BLUNT FILL (NEEDLE) ×1 IMPLANT
NEEDLE HYPO 18GX1.5 BLUNT FILL (NEEDLE) ×1
PAD ARMBOARD 7.5X6 YLW CONV (MISCELLANEOUS) ×1 IMPLANT
POSITIONER HEAD 8X9X4 ADT (SOFTGOODS) ×1 IMPLANT
RING MALYGIN 7.0 (MISCELLANEOUS) IMPLANT
SYR TB 1ML LL NO SAFETY (SYRINGE) ×1 IMPLANT
TAPE SURG TRANSPORE 1 IN (GAUZE/BANDAGES/DRESSINGS) IMPLANT
WATER STERILE IRR 250ML POUR (IV SOLUTION) ×1 IMPLANT

## 2023-03-10 NOTE — Anesthesia Postprocedure Evaluation (Signed)
Anesthesia Post Note  Patient: Talaiya Pham  Procedure(s) Performed: CATARACT EXTRACTION PHACO AND INTRAOCULAR LENS PLACEMENT (IOC) (Left: Eye)  Patient location during evaluation: Phase II Anesthesia Type: MAC Level of consciousness: awake Pain management: pain level controlled Vital Signs Assessment: post-procedure vital signs reviewed and stable Respiratory status: spontaneous breathing and respiratory function stable Cardiovascular status: blood pressure returned to baseline and stable Postop Assessment: no headache and no apparent nausea or vomiting Anesthetic complications: no Comments: Late entry   No notable events documented.   Last Vitals:  Vitals:   03/10/23 1006 03/10/23 1122  BP: (!) 191/83 (!) 171/98  Pulse: 86 87  Resp: 17 18  Temp: 36.8 C 36.9 C  SpO2: 100% 100%    Last Pain:  Vitals:   03/10/23 1122  TempSrc: Oral  PainSc: 0-No pain                 Windell Norfolk

## 2023-03-10 NOTE — Discharge Instructions (Signed)
Please discharge patient when stable, will follow up today with Dr. Carnella Fryman at the [facility] [place] office immediately following discharge.  Leave shield in place until visit.  All paperwork with discharge instructions will be given at the office.  [facility] Address:  [address]  [place], [place] [address]  

## 2023-03-10 NOTE — Anesthesia Preprocedure Evaluation (Signed)
Anesthesia Evaluation  Patient identified by MRN, date of birth, ID band Patient awake    Reviewed: Allergy & Precautions, H&P , NPO status , Patient's Chart, lab work & pertinent test results, reviewed documented beta blocker date and time   Airway Mallampati: II  TM Distance: >3 FB Neck ROM: full    Dental no notable dental hx.    Pulmonary neg pulmonary ROS, sleep apnea , former smoker   Pulmonary exam normal breath sounds clear to auscultation       Cardiovascular Exercise Tolerance: Good hypertension, + CAD, + Peripheral Vascular Disease and +CHF  negative cardio ROS + Valvular Problems/Murmurs  Rhythm:regular Rate:Normal     Neuro/Psych  Neuromuscular disease negative neurological ROS  negative psych ROS   GI/Hepatic negative GI ROS, Neg liver ROS,GERD  ,,  Endo/Other  negative endocrine ROSdiabetes    Renal/GU negative Renal ROS  negative genitourinary   Musculoskeletal   Abdominal   Peds  Hematology negative hematology ROS (+)   Anesthesia Other Findings   Reproductive/Obstetrics negative OB ROS                             Anesthesia Physical Anesthesia Plan  ASA: 3  Anesthesia Plan: MAC   Post-op Pain Management:    Induction:   PONV Risk Score and Plan:   Airway Management Planned:   Additional Equipment:   Intra-op Plan:   Post-operative Plan:   Informed Consent: I have reviewed the patients History and Physical, chart, labs and discussed the procedure including the risks, benefits and alternatives for the proposed anesthesia with the patient or authorized representative who has indicated his/her understanding and acceptance.     Dental Advisory Given  Plan Discussed with: CRNA  Anesthesia Plan Comments:        Anesthesia Quick Evaluation

## 2023-03-10 NOTE — Transfer of Care (Signed)
Immediate Anesthesia Transfer of Care Note  Patient: Christine Garrett  Procedure(s) Performed: CATARACT EXTRACTION PHACO AND INTRAOCULAR LENS PLACEMENT (IOC) (Left: Eye)  Patient Location: Short Stay  Anesthesia Type:MAC  Level of Consciousness: awake, alert , and oriented  Airway & Oxygen Therapy: Patient Spontanous Breathing  Post-op Assessment: Report given to RN and Post -op Vital signs reviewed and stable  Post vital signs: Reviewed and stable  Last Vitals:  Vitals Value Taken Time  BP    Temp    Pulse    Resp    SpO2      Last Pain:  Vitals:   03/10/23 1006  TempSrc: Oral  PainSc: 0-No pain         Complications: No notable events documented.

## 2023-03-10 NOTE — Anesthesia Procedure Notes (Signed)
Procedure Name: MAC Date/Time: 03/10/2023 11:04 AM  Performed by: Julian Reil, CRNAPre-anesthesia Checklist: Patient identified, Emergency Drugs available, Suction available and Patient being monitored Patient Re-evaluated:Patient Re-evaluated prior to induction Oxygen Delivery Method: Nasal cannula Placement Confirmation: positive ETCO2

## 2023-03-10 NOTE — Op Note (Signed)
Date of procedure: 03/10/23  Pre-operative diagnosis: Visually significant age-related cataract, Left Eye; Poor dilation, Left eye (H25.?2)   Post-operative diagnosis: Visually significant age-related cataract, Left Eye; Intra-operative Floppy Iris Syndrome, Left Eye (H21.81)  Procedure: Complex removal of cataract via phacoemulsification and insertion of intra-ocular lens [employer] DIB00 +18.0D into the capsular bag of the Left Eye (CPT [phone])  Attending surgeon: Rudy Jew. Callen Vancuren, MD, MA  Anesthesia: MAC, Topical Akten  Complications: None  Estimated Blood Loss: <90mL (minimal)  Specimens: None  Implants: As above  Indications:  Visually significant cataract, Left Eye  Procedure:  The patient was seen and identified in the pre-operative area. The operative eye was identified and dilated.  The operative eye was marked.  Topical anesthesia was administered to the operative eye.     The patient was then to the operative suite and placed in the supine position.  A timeout was performed confirming the patient, procedure to be performed, and all other relevant information.   The patient's face was prepped and draped in the usual fashion for intra-ocular surgery.  A lid speculum was placed into the operative eye and the surgical microscope moved into place and focused.  Poor dilation of the iris was confirmed.  An inferotemporal paracentesis was created using a 20 gauge paracentesis blade.  Shugarcaine was injected into the anterior chamber.  Viscoelastic was injected into the anterior chamber.  A temporal clear-corneal main wound incision was created using a 2.57mm microkeratome.  A Malyugin ring was placed.  A continuous curvilinear capsulorrhexis was initiated using an irrigating cystitome and completed using capsulorrhexis forceps.  Hydrodissection and hydrodeliniation were performed.  Viscoelastic was injected into the anterior chamber.  A phacoemulsification handpiece and a chopper  as a second instrument were used to remove the nucleus and epinucleus. The irrigation/aspiration handpiece was used to remove any remaining cortical material.   The capsular bag was reinflated with viscoelastic, checked, and found to be intact.  The intraocular lens was inserted into the capsular bag and dialed into place using a Customer service manager. The Malyugin ring was removed.  The irrigation/aspiration handpiece was used to remove any remaining viscoelastic.  The clear corneal wound and paracentesis wounds were then hydrated and checked with Weck-Cels to be watertight. 0.22mL of Moxifloxacin was injected into the anterior chamber. The lid-speculum and drape was removed, and the patient's face was cleaned with a wet and dry 4x4. A clear shield was taped over the eye. The patient was taken to the post-operative care unit in good condition, having tolerated the procedure well.  Post-Op Instructions: The patient will follow up at [facility] for a same day post-operative evaluation and will receive all other orders and instructions.

## 2023-03-10 NOTE — Interval H&P Note (Signed)
History and Physical Interval Note:  03/10/2023 10:47 AM  Christine Garrett  has presented today for surgery, with the diagnosis of combined forms age related cataract, left eye.  The various methods of treatment have been discussed with the patient and family. After consideration of risks, benefits and other options for treatment, the patient has consented to  Procedure(s) with comments: CATARACT EXTRACTION PHACO AND INTRAOCULAR LENS PLACEMENT (IOC) (Left) - pt knows to arrive at 8:00 as a surgical intervention.  The patient's history has been reviewed, patient examined, no change in status, stable for surgery.  I have reviewed the patient's chart and labs.  Questions were answered to the patient's satisfaction.     Fabio Pierce

## 2023-03-11 ENCOUNTER — Telehealth: Payer: Self-pay | Admitting: Hematology

## 2023-03-12 ENCOUNTER — Other Ambulatory Visit: Payer: Self-pay

## 2023-03-12 DIAGNOSIS — C9001 Multiple myeloma in remission: Secondary | ICD-10-CM

## 2023-03-12 MED ORDER — LENALIDOMIDE 10 MG PO CAPS: 10.0000 mg | ORAL_CAPSULE | Freq: Every day | ORAL | 0 refills | Status: DC

## 2023-03-13 ENCOUNTER — Encounter (HOSPITAL_COMMUNITY): Payer: Self-pay | Admitting: Ophthalmology

## 2023-03-17 ENCOUNTER — Other Ambulatory Visit: Payer: Self-pay

## 2023-03-17 ENCOUNTER — Telehealth: Payer: Self-pay

## 2023-03-17 NOTE — Telephone Encounter (Signed)
Oral Chemotherapy Pharmacist Encounter  I spoke with patient for overview of: Revlimid for the treatment of multiple myeloma, planned duration until disease progression or unacceptable toxicity.   Counseled patient on administration, dosing, side effects, monitoring, drug-food interactions, safe handling, storage, and disposal.  Patient will take Revlimid 10mg  capsules, 1 capsule by mouth once daily, without regard to food, with a full glass of water.  Revlimid will be given 21 days on, 7 days off, repeat every 28 days.  Revlimid start date: ***  Adverse effects of Revlimid include but are not limited to: nausea, constipation, diarrhea, abdominal pain, rash, fatigue, drug fever, and decreased blood counts.    Reviewed with patient importance of keeping a medication schedule and plan for any missed doses. No barriers to medication adherence identified.  Medication reconciliation performed and medication/allergy list updated.  Patient is currently on apixaban which will be used as the VTE prophylaxis.   Insurance authorization for Revlimid has been obtained.  Revlimid prescription is being dispensed from [facility] as it is a limited distribution medication.  All questions answered.  Patient voiced understanding and appreciation.   Medication education handout placed in mail for patient. Patient knows to call the office with questions or concerns. Oral Chemotherapy Clinic phone number provided to patient.   Bethel Born, PharmD Hematology/Oncology Clinical Pharmacist [facility] Oral Chemotherapy Navigation Clinic [phone] 03/17/2023    8:34 AM

## 2023-03-25 ENCOUNTER — Encounter (HOSPITAL_COMMUNITY): Payer: Medicare HMO

## 2023-03-31 ENCOUNTER — Ambulatory Visit (HOSPITAL_COMMUNITY): Admit: 2023-03-31 | Payer: Medicare HMO | Admitting: Ophthalmology

## 2023-03-31 SURGERY — CATARACT EXTRACTION PHACO AND INTRAOCULAR LENS PLACEMENT (IOC)
Anesthesia: Monitor Anesthesia Care | Laterality: Right

## 2023-04-01 ENCOUNTER — Other Ambulatory Visit: Payer: Self-pay

## 2023-04-01 DIAGNOSIS — N1831 Chronic kidney disease, stage 3a: Secondary | ICD-10-CM | POA: Diagnosis not present

## 2023-04-01 DIAGNOSIS — Z9484 Stem cells transplant status: Secondary | ICD-10-CM | POA: Diagnosis not present

## 2023-04-01 DIAGNOSIS — E7849 Other hyperlipidemia: Secondary | ICD-10-CM | POA: Diagnosis not present

## 2023-04-01 DIAGNOSIS — C9001 Multiple myeloma in remission: Secondary | ICD-10-CM

## 2023-04-01 DIAGNOSIS — J449 Chronic obstructive pulmonary disease, unspecified: Secondary | ICD-10-CM | POA: Diagnosis not present

## 2023-04-01 DIAGNOSIS — E1143 Type 2 diabetes mellitus with diabetic autonomic (poly)neuropathy: Secondary | ICD-10-CM | POA: Diagnosis not present

## 2023-04-01 DIAGNOSIS — I10 Essential (primary) hypertension: Secondary | ICD-10-CM | POA: Diagnosis not present

## 2023-04-01 DIAGNOSIS — Z6833 Body mass index (BMI) 33.0-33.9, adult: Secondary | ICD-10-CM | POA: Diagnosis not present

## 2023-04-01 DIAGNOSIS — I5042 Chronic combined systolic (congestive) and diastolic (congestive) heart failure: Secondary | ICD-10-CM | POA: Diagnosis not present

## 2023-04-01 DIAGNOSIS — E1121 Type 2 diabetes mellitus with diabetic nephropathy: Secondary | ICD-10-CM | POA: Diagnosis not present

## 2023-04-01 DIAGNOSIS — I82509 Chronic embolism and thrombosis of unspecified deep veins of unspecified lower extremity: Secondary | ICD-10-CM | POA: Diagnosis not present

## 2023-04-01 DIAGNOSIS — J301 Allergic rhinitis due to pollen: Secondary | ICD-10-CM | POA: Diagnosis not present

## 2023-04-02 ENCOUNTER — Inpatient Hospital Stay (HOSPITAL_BASED_OUTPATIENT_CLINIC_OR_DEPARTMENT_OTHER): Payer: Medicare HMO | Admitting: Hematology

## 2023-04-02 ENCOUNTER — Inpatient Hospital Stay: Payer: Medicare HMO | Attending: Hematology

## 2023-04-02 VITALS — BP 160/92 | HR 86 | Temp 97.2°F | Resp 18 | Wt 223.9 lb

## 2023-04-02 DIAGNOSIS — Z95828 Presence of other vascular implants and grafts: Secondary | ICD-10-CM

## 2023-04-02 DIAGNOSIS — Z79899 Other long term (current) drug therapy: Secondary | ICD-10-CM | POA: Insufficient documentation

## 2023-04-02 DIAGNOSIS — Z87891 Personal history of nicotine dependence: Secondary | ICD-10-CM | POA: Diagnosis not present

## 2023-04-02 DIAGNOSIS — C9002 Multiple myeloma in relapse: Secondary | ICD-10-CM | POA: Diagnosis not present

## 2023-04-02 DIAGNOSIS — C9001 Multiple myeloma in remission: Secondary | ICD-10-CM | POA: Insufficient documentation

## 2023-04-02 DIAGNOSIS — G629 Polyneuropathy, unspecified: Secondary | ICD-10-CM | POA: Diagnosis not present

## 2023-04-02 DIAGNOSIS — J3489 Other specified disorders of nose and nasal sinuses: Secondary | ICD-10-CM | POA: Diagnosis not present

## 2023-04-02 LAB — CBC WITH DIFFERENTIAL (CANCER CENTER ONLY)
Abs Immature Granulocytes: 0.03 10*3/uL (ref 0.00–0.07)
Basophils Absolute: 0.1 10*3/uL (ref 0.0–0.1)
Basophils Relative: 1 %
Eosinophils Absolute: 0.3 10*3/uL (ref 0.0–0.5)
Eosinophils Relative: 5 %
HCT: 36.9 % (ref 36.0–46.0)
Hemoglobin: 12.2 g/dL (ref 12.0–15.0)
Immature Granulocytes: 1 %
Lymphocytes Relative: 28 %
Lymphs Abs: 1.6 10*3/uL (ref 0.7–4.0)
MCH: 32.3 pg (ref 26.0–34.0)
MCHC: 33.1 g/dL (ref 30.0–36.0)
MCV: 97.6 fL (ref 80.0–100.0)
Monocytes Absolute: 0.5 10*3/uL (ref 0.1–1.0)
Monocytes Relative: 9 %
Neutro Abs: 3.3 10*3/uL (ref 1.7–7.7)
Neutrophils Relative %: 56 %
Platelet Count: 269 10*3/uL (ref 150–400)
RBC: 3.78 MIL/uL — ABNORMAL LOW (ref 3.87–5.11)
RDW: 13.1 % (ref 11.5–15.5)
WBC Count: 5.8 10*3/uL (ref 4.0–10.5)
nRBC: 0 % (ref 0.0–0.2)

## 2023-04-02 LAB — CMP (CANCER CENTER ONLY)
ALT: 25 U/L (ref 0–44)
AST: 27 U/L (ref 15–41)
Albumin: 3.8 g/dL (ref 3.5–5.0)
Alkaline Phosphatase: 62 U/L (ref 38–126)
Anion gap: 11 (ref 5–15)
BUN: 8 mg/dL (ref 8–23)
CO2: 21 mmol/L — ABNORMAL LOW (ref 22–32)
Calcium: 9.3 mg/dL (ref 8.9–10.3)
Chloride: 108 mmol/L (ref 98–111)
Creatinine: 1.22 mg/dL — ABNORMAL HIGH (ref 0.44–1.00)
GFR, Estimated: 50 mL/min — ABNORMAL LOW (ref >60)
Glucose, Bld: 109 mg/dL — ABNORMAL HIGH (ref 70–99)
Potassium: 3.7 mmol/L (ref 3.5–5.1)
Sodium: 140 mmol/L (ref 135–145)
Total Bilirubin: 0.6 mg/dL (ref 0.3–1.2)
Total Protein: 7.5 g/dL (ref 6.5–8.1)

## 2023-04-02 MED ORDER — SODIUM CHLORIDE 0.9% FLUSH
10.0000 mL | Freq: Once | INTRAVENOUS | Status: AC
  Administered 2023-04-02: 10 mL

## 2023-04-02 MED ORDER — HEPARIN SOD (PORK) LOCK FLUSH 100 UNIT/ML IV SOLN
500.0000 [IU] | Freq: Once | INTRAVENOUS | Status: AC
  Administered 2023-04-02: 500 [IU]

## 2023-04-02 NOTE — Progress Notes (Signed)
HEMATOLOGY/ONCOLOGY CLINIC VISIT NOTE  Date of Service: 04/02/23    Patient Care Team: Toma Deiters, MD as PCP - General (Internal Medicine) Glendale Chard, DO as Consulting Physician (Neurology) Kemper Durie, RN as [facility] Care Management  CHIEF COMPLAINTS/PURPOSE OF CONSULTATION:   Follow-up for continued evaluation and management of multiple myeloma  Oncologic History:  67 y.o. female who initially presented with significant weight loss in the context of H. pylori-associated gastritis and alcohol abuse. At the same time, an incidental discovery of a lower thoracic vertebral mass in the context of chronic numbness and weakness in the lower extremities. MRI of the thoracic spine demonstrated a destructive lesion at T8 level and patient underwent surgical treatment. 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 Staging evaluation conducted by our clinic confirmed presence of IgG kappa multiple myeloma. Cytogenetics studies demonstrated normal karyotype, FISH studies are positive for 13q34 & D13S319 loss and no loss of p53. Based on initial information, patient was staged at [facility].    Patient has completed 6 cycles of induction systemic chemotherapy with lenalidomide, bortezomib, and low-dose dexamethasone.  Repeat bone marrow biopsy demonstrated excellent response to treatment and patient subsequently underwent consolidation therapy including melphalan conditioning and autologous stem cell rescue.  Repeat assessment with PET/CT and bone marrow biopsy on 10/13/2017 demonstrates stringent complete response maintenance.  HISTORY OF PRESENTING ILLNESS:  Please see previous note for details on initial presentation  INTERVAL HISTORY:   Christine Garrett is a 67 y.o. female here for continued evaluation and management of her multiple myeloma. Patient was last seen by me on 02/26/2023 and complained of worsened hip pain, manageable numbness  related to neuropathy, and mild sinus tenderness in left sinus. She reported that she developed a cold and complained of cough with clear phlegm and noted improved throat soreness  Today, she is accompanied by a family member. Patient recently started Revlimid 10 MG on Friday. She complains of endorsing nausea sometimes and takes anti-nausea medication in the mornings. Patient has no other issues with tolerating Revlimid.   She complains of new right sinus pain and reports that she cannot smell. Patient notes persistent sniffles.   Her bone pain has been stable. Patient denies any new leg swelling, abdominal pain, skin rashes, cramping, diarrhea, abdominal pain, or other new medications. Her weight has been stable at 223 pounds.   She reports that her neuropathy has worsened. Patient reports that Gabapentin does not improve her numbness/tingling. She denies any burning sensation or pain with her neuropathy.   Patient received the flu, pneumonia, covid-19 vaccines last year.   MEDICAL HISTORY:  Past Medical History:  Diagnosis Date   Arthritis    knees   Cancer (HCC)    Carpal tunnel syndrome    CHF (congestive heart failure) (HCC)    Diabetes mellitus    type 2   GERD (gastroesophageal reflux disease)    Heart murmur    "Little, No concerns" per Dr  Andee Lineman pt reported.   Hypertension    controlled using a guided approch with plasma renin activity   Multiple myeloma not having achieved remission (HCC) 01/06/2017   Neuropathy    Peripheral vascular disease (HCC)    Sleep apnea    01/03/2020: per patient, hasn't used CPAP machine in two years due to inability to breathe well with it on    SURGICAL HISTORY: Past Surgical History:  Procedure Laterality Date   BIOPSY  11/28/2016   Procedure: BIOPSY;  Surgeon: Malissa Hippo, MD;  [facility]: [facility];  Service: Endoscopy;;  gastric   BIOPSY  07/02/2019   Procedure: BIOPSY;  Surgeon: Malissa Hippo, MD;  Location: [facility];   Service: Endoscopy;;   BTL     1982   CATARACT EXTRACTION W/PHACO Left 03/10/2023   Procedure: CATARACT EXTRACTION PHACO AND INTRAOCULAR LENS PLACEMENT (IOC);  Surgeon: Fabio Pierce, MD;  Location: [facility];  Service: Ophthalmology;  Laterality: Left;  CDE: 9.43   CERVICAL CONE BIOPSY     cervical lesion   COLONOSCOPY WITH PROPOFOL N/A 07/02/2019   Procedure: COLONOSCOPY WITH PROPOFOL;  Surgeon: Malissa Hippo, MD;  [facility]: [facility];  Service: Endoscopy;  Laterality: N/A;  12:40 - pt can't come earlier due to transportation   ESOPHAGOGASTRODUODENOSCOPY (EGD) WITH PROPOFOL N/A 11/28/2016   Procedure: ESOPHAGOGASTRODUODENOSCOPY (EGD) WITH PROPOFOL;  Surgeon: Malissa Hippo, MD;  [facility];  Service: Endoscopy;  Laterality: N/A;  9:25   IR FLUORO GUIDE PORT INSERTION RIGHT  01/29/2017   IR US GUIDE VASC ACCESS RIGHT  01/29/2017   lipoma removal     right shoulder 2001   LUMBAR FUSION  01/05/2020    Lumbar Two-Three Lumbar Three-Four Lumbar Four-Five Posterior lumbar interbody fusion with decompression (N/A Spine Lumbar)   MULTIPLE EXTRACTIONS WITH ALVEOLOPLASTY Bilateral 08/24/2012   Procedure: MULTIPLE EXTRACTION WITH ALVEOLOPLASTY BIOPSY OF RIGHT AND LEFT MANDIBLE ;  Surgeon: Georgia Lopes, DDS;  Location: [facility];  Service: Oral Surgery;  Laterality: Bilateral;   POLYPECTOMY  07/02/2019   Procedure: POLYPECTOMY;  Surgeon: Malissa Hippo, MD;  [facility];  Service: Endoscopy;;   POSTERIOR LUMBAR FUSION 4 LEVEL N/A 12/26/2016   Procedure: THORACIC EIGHT TUMOR RESECTION, THORACIC SIX- THORACIC TEN POSTERIOR SPINAL FUSION;  Surgeon: Coletta Memos, MD;  Location: [facility];  Service: Neurosurgery;  Laterality: N/A;  THORACIC 8 TUMOR RESECTION, THORACIC 6- THORACIC 10 POSTERIOR SPINAL FUSION   ROTATOR CUFF REPAIR     right shoulder   TUBAL LIGATION      SOCIAL HISTORY: Social History   Socioeconomic History   Marital status: Single    Spouse name: Not on file    Number of children: 3   Years of education: Not on file   Highest education level: Not on file  Occupational History   Occupation: on disability   Occupation: former CNA  Tobacco Use   Smoking status: Former    Current packs/day: 0.00    Average packs/day: 0.5 packs/day for 6.0 years (3.0 ttl pk-yrs)    Types: Cigarettes    Start date: 2013    Quit date: 2019    Years since quitting: 5.7   Smokeless tobacco: Never  Vaping Use   Vaping status: Never Used  Substance and Sexual Activity   Alcohol use: No    Alcohol/week: 6.0 standard drinks of alcohol    Types: 6 Cans of beer per week   Drug use: No   Sexual activity: Not Currently    Birth control/protection: Post-menopausal  Other Topics Concern   Not on file  Social History Narrative   Lives with son, daughter and grandkids in a 2 story home but stays on the first floor.  Has 3 children.  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      Right handed   Social Determinants of Health   Financial Resource Strain: Not on file  Food Insecurity: No Food  Insecurity (01/30/2022)   Hunger Vital Sign    Worried About Running Out of Food in the Last Year: Never true    Ran Out of Food in the Last Year: Never true  Transportation Needs: No Transportation Needs (01/30/2022)   PRAPARE - Administrator, Civil Service (Medical): No    Lack of Transportation (Non-Medical): No  Physical Activity: Not on file  Stress: Not on file  Social Connections: Unknown (05/21/2022)   Received from [facility]   Social Network    Social Network: Not on file  Intimate Partner Violence: Unknown (05/21/2022)   Received from [facility], [facility]   HITS    Physically Hurt: Not on file    Insult or Talk Down To: Not on file    Threaten Physical Harm: Not on file    Scream or Curse: Not on file    FAMILY HISTORY: Family History  Problem Relation Age of Onset   Diabetes Mother    Hypertension  Mother    Heart disease Mother    Alzheimer's disease Mother    Diabetes Father    Heart disease Sister    Diabetes Sister     ALLERGIES:  is allergic to hydrocodone.  MEDICATIONS:  Current Outpatient Medications  Medication Sig Dispense Refill   amLODipine (NORVASC) 5 MG tablet Take 5 mg by mouth daily.     apixaban (ELIQUIS) 5 MG TABS tablet Take 1 tablet (5 mg total) by mouth 2 (two) times daily.     atorvastatin (LIPITOR) 40 MG tablet Take 40 mg by mouth daily.     enalapril (VASOTEC) 20 MG tablet Take 20 mg by mouth 2 (two) times daily.      gabapentin (NEURONTIN) 300 MG capsule TAKE 2 CAPSULES (600 MG TOTAL) BY MOUTH 3 (THREE) TIMES DAILY. 540 capsule 0   hydrALAZINE (APRESOLINE) 25 MG tablet Take 25 mg by mouth 2 (two) times daily.     insulin degludec (TRESIBA FLEXTOUCH) 100 UNIT/ML SOPN FlexTouch Pen Inject 0.5 mLs (50 Units total) into the skin daily at 10 pm. (Patient taking differently: Inject 40 Units into the skin daily at 10 pm.) 5 pen 2   Insulin Pen Needle (B-D ULTRAFINE III SHORT PEN) 31G X 8 MM MISC 1 each by Does not apply route as directed. 100 each 3   lenalidomide (REVLIMID) 10 MG capsule Take 1 capsule (10 mg total) by mouth daily. Take 1 capsule (10 mg) by mouth daily for 21 days and then take 7 days off. 21 capsule 0   metFORMIN (GLUCOPHAGE) 1000 MG tablet Take 1,000 mg by mouth 2 (two) times daily.      metoprolol succinate (TOPROL-XL) 50 MG 24 hr tablet Take 50 mg by mouth daily. Take with or immediately following a meal.     Omega-3 Fatty Acids (FISH OIL) 1000 MG CPDR Take 1,000 mg by mouth daily. Omega 3 300 mg     ondansetron (ZOFRAN) 8 MG tablet Take 1 tablet (8 mg total) by mouth every 8 (eight) hours as needed for nausea or vomiting. 30 tablet 0   potassium chloride (MICRO-K) 10 MEQ CR capsule Take 10 mEq by mouth daily.     Vitamin D, Ergocalciferol, (DRISDOL) 50000 units CAPS capsule Take 50,000 Units by mouth every Monday.      No current  facility-administered medications for this visit.    REVIEW OF SYSTEMS:    10 Point review of Systems was done is negative except as  noted above.   PHYSICAL EXAMINATION:  .BP (!) 160/92   Pulse 86   Temp (!) 97.2 F (36.2 C)   Resp 18   Wt 223 lb 14.4 oz (101.6 kg)   SpO2 100%   BMI 33.06 kg/m  GENERAL:alert, in no acute distress and comfortable SKIN: no acute rashes, no significant lesions EYES: conjunctiva are pink and non-injected, sclera anicteric OROPHARYNX: MMM, no exudates, no oropharyngeal erythema or ulceration NECK: supple, no JVD LYMPH:  no palpable lymphadenopathy in the cervical, axillary or inguinal regions LUNGS: clear to auscultation b/l with normal respiratory effort HEART: regular rate & rhythm ABDOMEN:  normoactive bowel sounds , non tender, not distended. Extremity: no pedal edema PSYCH: alert & oriented x 3 with fluent speech NEURO: no focal motor/sensory deficits    LABORATORY DATA:  I have reviewed the data as listed      Latest Ref Rng & Units 04/02/2023    9:54 AM 02/26/2023   12:11 PM 02/18/2023    7:53 AM  CBC  WBC 4.0 - 10.5 K/uL 5.8  7.7  4.7   Hemoglobin 12.0 - 15.0 g/dL 08.6  57.8  46.9   Hematocrit 36.0 - 46.0 % 36.9  36.6  36.0   Platelets 150 - 400 K/uL 269  250  251     .    Latest Ref Rng & Units 04/02/2023    9:54 AM 02/26/2023   12:11 PM 01/08/2023   12:17 PM  CMP  Glucose 70 - 99 mg/dL 629  528  77   BUN 8 - 23 mg/dL 8  8  14    Creatinine 0.44 - 1.00 mg/dL 4.13  2.44  0.10   Sodium 135 - 145 mmol/L 140  140  141   Potassium 3.5 - 5.1 mmol/L 3.7  4.0  3.9   Chloride 98 - 111 mmol/L 108  108  111   CO2 22 - 32 mmol/L 21  25  23    Calcium 8.9 - 10.3 mg/dL 9.3  9.5  9.8   Total Protein 6.5 - 8.1 g/dL 7.5  7.5  7.3   Total Bilirubin 0.3 - 1.2 mg/dL 0.6  0.6  0.4   Alkaline Phos 38 - 126 U/L 62  64  77   AST 15 - 41 U/L 27  19  20    ALT 0 - 44 U/L 25  15  18     Bone marrow biopsy 02/18/2023:    03/25/18 SPEP and  SFLC:     04/21/17 Cytogenetics:    01/17/17 Cytogenetics:    RADIOGRAPHIC STUDIES: I have personally reviewed the radiological images as listed and agreed with the findings in the report.  No results found.  10/13/17 PET/CT     ASSESSMENT & PLAN:   67 y.o. female with  1. Multiple myeloma Currently in remission Autologous PBSCT on 07/01/17. IgG kappa multiple myeloma. Cytogenetics studies demonstrated normal karyotype, FISH studies are positive for 13q34 & D13S319 loss and no loss of p53. Based on initial information, patient was staged at [facility].  Completed 6 cycles of induction with Revlimid, Velcade and dexamethasone  12/09/2018 lumbar spine xray with results revealing "No acute osseous abnormality. Extensive multilevel degenerative changes are noted throughout the lumbar spine."  12/09/2018 sacrum and coccyx xray with results revealing "No acute osseous abnormality. Degenerative changes are noted of both hips."  2. Grade 2 neuropathy -controlled  02/26/18 NCV with EMG which revealed The electrophysiologic findings are most consistent with a chronic, length-dependent  sensorimotor axonal and demyelinating polyneuropathy affecting the right side. Overall, these findings are moderate to severe in degree electrically. Incidentally, there is a right Martin-Gruber anastomosis, a normal variant.   3. Rt LE calf veins - possible clot ?chronic.  01/29/18 VAS [place] BLE which revealed Right: Ultrasound is unable to distinguish whether obstruction in the Peroneal veins, and great saphenous vein, and superficial veins/varciosities is acute or chronic. No cystic structure found in the popliteal fossa. Left: no evidence of DVT.   PLAN:  -Discussed lab results on 04/02/23 in detail with patient. CBC normal, showed WBC of 5.8K, hemoglobin of 12.2, and platelets of 269K. -CMP stable -last myeloma panel from 1 month ago showed M protein level of 0.3 g/dL -patient has mild toxicity of nausea  with Revlimid. She denies any other major toxicities.  -continue maintenance Revlimid treatment at 10 MG -continue anti-nausea medications as needed -recommend patient to stay UTD with age-appropriate vaccines, including flu, pneumonia, and COVID-19 -will refer patient to a radiation oncologist to consider palliative radiation therapy to worsening right sinus nodule causing pain -continue Zometa every 3 months -will continue to monitor with labs in 6 weeks  FOLLOW-UP: -Referral to radiation oncology for pallaitive RT to myeloma involving the rt maxilla causing pain. -continue Zometa every 12 weeks -- plz schedule next 4 doses -RTC with Dr Candise Che with labs with next dose of Zometa in dec 2024  The total time spent in the appointment was 30 minutes* .  All of the patient's questions were answered with apparent satisfaction. The patient knows to call the clinic with any problems, questions or concerns.   Wyvonnia Lora MD MS AAHIVMS [facility] Hematology/Oncology Physician [facility]  .*Total Encounter Time as defined by the Centers for Medicare and Medicaid Services includes, in addition to the face-to-face time of a patient visit (documented in the note above) non-face-to-face time: obtaining and reviewing outside history, ordering and reviewing medications, tests or procedures, care coordination (communications with other health care professionals or caregivers) and documentation in the medical record.    I,Mitra Faeizi,acting as a Neurosurgeon for Wyvonnia Lora, MD.,have documented all relevant documentation on the behalf of Wyvonnia Lora, MD,as directed by  Wyvonnia Lora, MD while in the presence of Wyvonnia Lora, MD.  .I have reviewed the above documentation for accuracy and completeness, and I agree with the above. [name] MD

## 2023-04-04 LAB — KAPPA/LAMBDA LIGHT CHAINS
Kappa free light chain: 64.2 mg/L — ABNORMAL HIGH (ref 3.3–19.4)
Kappa, lambda light chain ratio: 1.82 — ABNORMAL HIGH (ref 0.26–1.65)
Lambda free light chains: 35.2 mg/L — ABNORMAL HIGH (ref 5.7–26.3)

## 2023-04-07 LAB — MULTIPLE MYELOMA PANEL, SERUM
Albumin SerPl Elph-Mcnc: 3.6 g/dL (ref 2.9–4.4)
Albumin/Glob SerPl: 1.1 (ref 0.7–1.7)
Alpha 1: 0.2 g/dL (ref 0.0–0.4)
Alpha2 Glob SerPl Elph-Mcnc: 0.8 g/dL (ref 0.4–1.0)
B-Globulin SerPl Elph-Mcnc: 1 g/dL (ref 0.7–1.3)
Gamma Glob SerPl Elph-Mcnc: 1.4 g/dL (ref 0.4–1.8)
Globulin, Total: 3.4 g/dL (ref 2.2–3.9)
IgA: 241 mg/dL (ref 87–352)
IgG (Immunoglobin G), Serum: 1336 mg/dL (ref 586–1602)
IgM (Immunoglobulin M), Srm: 47 mg/dL (ref 26–217)
M Protein SerPl Elph-Mcnc: 0.3 g/dL — ABNORMAL HIGH
Total Protein ELP: 7 g/dL (ref 6.0–8.5)

## 2023-04-08 ENCOUNTER — Encounter: Payer: Self-pay | Admitting: Hematology

## 2023-04-09 ENCOUNTER — Other Ambulatory Visit: Payer: Self-pay | Admitting: Hematology

## 2023-04-09 DIAGNOSIS — M1990 Unspecified osteoarthritis, unspecified site: Secondary | ICD-10-CM | POA: Diagnosis not present

## 2023-04-09 DIAGNOSIS — I4891 Unspecified atrial fibrillation: Secondary | ICD-10-CM | POA: Diagnosis not present

## 2023-04-09 DIAGNOSIS — D6869 Other thrombophilia: Secondary | ICD-10-CM | POA: Diagnosis not present

## 2023-04-09 DIAGNOSIS — E1122 Type 2 diabetes mellitus with diabetic chronic kidney disease: Secondary | ICD-10-CM | POA: Diagnosis not present

## 2023-04-09 DIAGNOSIS — E1142 Type 2 diabetes mellitus with diabetic polyneuropathy: Secondary | ICD-10-CM | POA: Diagnosis not present

## 2023-04-09 DIAGNOSIS — I2510 Atherosclerotic heart disease of native coronary artery without angina pectoris: Secondary | ICD-10-CM | POA: Diagnosis not present

## 2023-04-09 DIAGNOSIS — C9001 Multiple myeloma in remission: Secondary | ICD-10-CM

## 2023-04-09 DIAGNOSIS — R269 Unspecified abnormalities of gait and mobility: Secondary | ICD-10-CM | POA: Diagnosis not present

## 2023-04-09 DIAGNOSIS — N1831 Chronic kidney disease, stage 3a: Secondary | ICD-10-CM | POA: Diagnosis not present

## 2023-04-09 DIAGNOSIS — E785 Hyperlipidemia, unspecified: Secondary | ICD-10-CM | POA: Diagnosis not present

## 2023-04-09 DIAGNOSIS — I130 Hypertensive heart and chronic kidney disease with heart failure and stage 1 through stage 4 chronic kidney disease, or unspecified chronic kidney disease: Secondary | ICD-10-CM | POA: Diagnosis not present

## 2023-04-09 DIAGNOSIS — J301 Allergic rhinitis due to pollen: Secondary | ICD-10-CM | POA: Diagnosis not present

## 2023-04-09 DIAGNOSIS — E1151 Type 2 diabetes mellitus with diabetic peripheral angiopathy without gangrene: Secondary | ICD-10-CM | POA: Diagnosis not present

## 2023-04-11 ENCOUNTER — Telehealth: Payer: Self-pay | Admitting: Radiation Oncology

## 2023-04-11 NOTE — Telephone Encounter (Signed)
Left message for patient to call back to schedule consult per 10/22 referral.

## 2023-04-14 NOTE — Progress Notes (Signed)
Radiation Oncology         [phone]) [phone] ________________________________  Name: Christine Garrett        MRN: [id number]  Date of Service: 04/15/2023 DOB: 08-Apr-1956  [id number], [name], MD  [name], MD     REFERRING PHYSICIAN: [name], MD   DIAGNOSIS: The encounter diagnosis was Multiple myeloma in remission [facility]).   HISTORY OF PRESENT ILLNESS: [name] is a 67 y.o. female seen at the request of Dr. Silvestre Gunner that she was originally diagnosed in 2018. She received induction chemotherapy and autologous stem cell rescue. Her most recent PET scan on 02/10/2023 showed an 11 mm hypermetabolic osseous lesion in the anterior wall of the right maxillary sinus, there was no focal lesions otherwise in the skeleton. She did have a bone marrow biopsy on 02/18/2023 that confirmed slightly hypercellular bone marrow. She recently began Revlimid and has been experiencing same difficulty with hip pain and pain in her right cheek.  Given the symptoms, she is seen to discuss palliative radiation.    PREVIOUS RADIATION THERAPY: {EXAM; YES/NO:19492::"No"}   PAST MEDICAL HISTORY:  Past Medical History:  Diagnosis Date   Arthritis    knees   Cancer (HCC)    Carpal tunnel syndrome    CHF (congestive heart failure) (HCC)    Diabetes mellitus    type 2   GERD (gastroesophageal reflux disease)    Heart murmur    "Little, No concerns" per Dr  Andee Lineman pt reported.   Hypertension    controlled using a guided approch with plasma renin activity   Multiple myeloma not having achieved remission (HCC) 01/06/2017   Neuropathy    Peripheral vascular disease (HCC)    Sleep apnea    01/03/2020: per patient, hasn't used CPAP machine in two years due to inability to breathe well with it on       PAST SURGICAL HISTORY: Past Surgical History:  Procedure Laterality Date   BIOPSY  11/28/2016   Procedure: BIOPSY;  Surgeon: Malissa Hippo, MD;  [facility];  Service:  Endoscopy;;  gastric   BIOPSY  07/02/2019   Procedure: BIOPSY;  Surgeon: Malissa Hippo, MD;  [facility];  Service: Endoscopy;;   BTL     1982   CATARACT EXTRACTION W/PHACO Left 03/10/2023   Procedure: CATARACT EXTRACTION PHACO AND INTRAOCULAR LENS PLACEMENT (IOC);  Surgeon: Fabio Pierce, MD;  Location: [facility];  Service: Ophthalmology;  Laterality: Left;  CDE: 9.43   CERVICAL CONE BIOPSY     cervical lesion   COLONOSCOPY WITH PROPOFOL N/A 07/02/2019   Procedure: COLONOSCOPY WITH PROPOFOL;  Surgeon: Malissa Hippo, MD;  [facility];  Service: Endoscopy;  Laterality: N/A;  12:40 - pt can't come earlier due to transportation   ESOPHAGOGASTRODUODENOSCOPY (EGD) WITH PROPOFOL N/A 11/28/2016   Procedure: ESOPHAGOGASTRODUODENOSCOPY (EGD) WITH PROPOFOL;  Surgeon: Malissa Hippo, MD;  [facility];  Service: Endoscopy;  Laterality: N/A;  9:25   IR FLUORO GUIDE PORT INSERTION RIGHT  01/29/2017   IR US GUIDE VASC ACCESS RIGHT  01/29/2017   lipoma removal     right shoulder 2001   LUMBAR FUSION  01/05/2020    Lumbar Two-Three Lumbar Three-Four Lumbar Four-Five Posterior lumbar interbody fusion with decompression (N/A Spine Lumbar)   MULTIPLE EXTRACTIONS WITH ALVEOLOPLASTY Bilateral 08/24/2012   Procedure: MULTIPLE EXTRACTION WITH ALVEOLOPLASTY BIOPSY OF RIGHT AND LEFT MANDIBLE ;  Surgeon: Georgia Lopes, DDS;  Location: [facility];  Service: Oral Surgery;  Laterality: Bilateral;   POLYPECTOMY  07/02/2019   Procedure: POLYPECTOMY;  Surgeon: Malissa Hippo, MD;  [facility];  Service: Endoscopy;;   POSTERIOR LUMBAR FUSION 4 LEVEL N/A 12/26/2016   Procedure: THORACIC EIGHT TUMOR RESECTION, THORACIC SIX- THORACIC TEN POSTERIOR SPINAL FUSION;  Surgeon: Coletta Memos, MD;  Location: [facility];  Service: Neurosurgery;  Laterality: N/A;  THORACIC 8 TUMOR RESECTION, THORACIC 6- THORACIC 10 POSTERIOR SPINAL FUSION   ROTATOR CUFF REPAIR     right shoulder   TUBAL LIGATION        FAMILY HISTORY:  Family History  Problem Relation Age of Onset   Diabetes Mother    Hypertension Mother    Heart disease Mother    Alzheimer's disease Mother    Diabetes Father    Heart disease Sister    Diabetes Sister      SOCIAL HISTORY:  reports that she quit smoking about 5 years ago. Her smoking use included cigarettes. She started smoking about 11 years ago. She has a 3 pack-year smoking history. She has never used smokeless tobacco. She reports that she does not drink alcohol and does not use drugs.   ALLERGIES: Hydrocodone   MEDICATIONS:  Current Outpatient Medications  Medication Sig Dispense Refill   amLODipine (NORVASC) 5 MG tablet Take 5 mg by mouth daily.     apixaban (ELIQUIS) 5 MG TABS tablet Take 1 tablet (5 mg total) by mouth 2 (two) times daily.     atorvastatin (LIPITOR) 40 MG tablet Take 40 mg by mouth daily.     enalapril (VASOTEC) 20 MG tablet Take 20 mg by mouth 2 (two) times daily.      gabapentin (NEURONTIN) 300 MG capsule TAKE 2 CAPSULES (600 MG TOTAL) BY MOUTH 3 (THREE) TIMES DAILY. 540 capsule 0   hydrALAZINE (APRESOLINE) 25 MG tablet Take 25 mg by mouth 2 (two) times daily.     insulin degludec (TRESIBA FLEXTOUCH) 100 UNIT/ML SOPN FlexTouch Pen Inject 0.5 mLs (50 Units total) into the skin daily at 10 pm. (Patient taking differently: Inject 40 Units into the skin daily at 10 pm.) 5 pen 2   Insulin Pen Needle (B-D ULTRAFINE III SHORT PEN) 31G X 8 MM MISC 1 each by Does not apply route as directed. 100 each 3   lenalidomide (REVLIMID) 10 MG capsule TAKE 1 CAPSULE BY MOUTH 1 TIME A DAY FOR 21 DAYS ON THEN 7 DAYS OFF 21 capsule 0   metFORMIN (GLUCOPHAGE) 1000 MG tablet Take 1,000 mg by mouth 2 (two) times daily.      metoprolol succinate (TOPROL-XL) 50 MG 24 hr tablet Take 50 mg by mouth daily. Take with or immediately following a meal.     Omega-3 Fatty Acids (FISH OIL) 1000 MG CPDR Take 1,000 mg by mouth daily. Omega 3 300 mg     ondansetron  (ZOFRAN) 8 MG tablet Take 1 tablet (8 mg total) by mouth every 8 (eight) hours as needed for nausea or vomiting. 30 tablet 0   potassium chloride (MICRO-K) 10 MEQ CR capsule Take 10 mEq by mouth daily.     Vitamin D, Ergocalciferol, (DRISDOL) 50000 units CAPS capsule Take 50,000 Units by mouth every Monday.      No current facility-administered medications for this visit.     REVIEW OF SYSTEMS: On review of systems, the patient reports that *** is doing well overall. *** denies any chest pain, shortness of breath, cough, fevers, chills, night sweats, unintended  weight changes. *** denies any bowel or bladder disturbances, and denies abdominal pain, nausea or vomiting. *** denies any new musculoskeletal or joint aches or pains. A complete review of systems is obtained and is otherwise negative.     PHYSICAL EXAM:  Wt Readings from Last 3 Encounters:  04/02/23 223 lb 14.4 oz (101.6 kg)  03/10/23 223 lb 5.2 oz (101.3 kg)  03/06/23 223 lb 4.8 oz (101.3 kg)   Temp Readings from Last 3 Encounters:  04/02/23 (!) 97.2 F (36.2 C)  03/10/23 98.5 F (36.9 C) (Oral)  02/26/23 99 F (37.2 C) (Oral)   BP Readings from Last 3 Encounters:  04/02/23 (!) 160/92  03/10/23 (!) 171/98  02/26/23 (!) 165/84   Pulse Readings from Last 3 Encounters:  04/02/23 86  03/10/23 87  02/26/23 93    /10  In general this is a well appearing *** in no acute distress. ***'s alert and oriented x4 and appropriate throughout the examination. Cardiopulmonary assessment is negative for acute distress and *** exhibits normal effort.     ECOG = ***  0 - Asymptomatic (Fully active, able to carry on all predisease activities without restriction)  1 - Symptomatic but completely ambulatory (Restricted in physically strenuous activity but ambulatory and able to carry out work of a light or sedentary nature. For example, light housework, office work)  2 - Symptomatic, <50% in bed during the day (Ambulatory and  capable of all self care but unable to carry out any work activities. Up and about more than 50% of waking hours)  3 - Symptomatic, >50% in bed, but not bedbound (Capable of only limited self-care, confined to bed or chair 50% or more of waking hours)  4 - Bedbound (Completely disabled. Cannot carry on any self-care. Totally confined to bed or chair)  5 - Death   Santiago Glad MM, Creech RH, Tormey DC, et al. [phone]). "Toxicity and response criteria of the [facility]". Am. Evlyn Clines. Oncol. 5 (6): 649-55    LABORATORY DATA:  Lab Results  Component Value Date   WBC 5.8 04/02/2023   HGB 12.2 04/02/2023   HCT 36.9 04/02/2023   MCV 97.6 04/02/2023   PLT 269 04/02/2023   Lab Results  Component Value Date   NA 140 04/02/2023   K 3.7 04/02/2023   CL 108 04/02/2023   CO2 21 (L) 04/02/2023   Lab Results  Component Value Date   ALT 25 04/02/2023   AST 27 04/02/2023   ALKPHOS 62 04/02/2023   BILITOT 0.6 04/02/2023      RADIOGRAPHY: No results found.     IMPRESSION/PLAN: 1. Painful bone disease resulting from multiple myeloma that has been reactivated following autologous transplant. Dr. Mitzi Hansen discusses the patient's diagnosis and course to date.  He discusses the role of palliative radiation for purposes of pain relief as she continues the course of Revlimid that she recently began.  We discussed the risks, benefits, short, and long term effects of radiotherapy, as well as the curative intent, and the patient is interested in proceeding. Dr. Mitzi Hansen discusses the delivery and logistics of radiotherapy and anticipates a course of 2 weeks of radiotherapy to the ***  In a visit lasting 60 minutes, greater than 50% of the time was spent face to face discussing the patient's condition, in preparation for the discussion, and coordinating the patient's care.   The above documentation reflects my direct findings during this shared patient visit. Please see the separate note by Dr.  Moody on this date for the remainder of the patient's plan of care.    Osker Mason, [facility]   **Disclaimer: This note was dictated with voice recognition software. Similar sounding words can inadvertently be transcribed and this note may contain transcription errors which may not have been corrected upon publication of note.**

## 2023-04-14 NOTE — Progress Notes (Signed)
Histology and Location of Primary Cancer: Multiple Myeloma involving the right maxilla   Sites of Visceral and Bony Metastatic Disease: Right Maxilla   Past/Anticipated chemotherapy by medical oncology, if any:  -On maintenance Revlimid  Pain on a scale of 0-10 is:  She reports generalized pain.  She states pain in her mid back is worse, 7/10.  She is using a lidocaine patch. She reports pain when she touches or blows her nose.  She reports facial pains under her eyes upwards to the bridge of her nose.   Ambulatory status? Walker? Wheelchair?: Ambulatory with a cane  SAFETY ISSUES: Prior radiation? No Pacemaker/ICD? No Possible current pregnancy? Postmenopausal Is the patient on methotrexate? No  Current Complaints / other details:

## 2023-04-15 ENCOUNTER — Encounter: Payer: Self-pay | Admitting: Radiation Oncology

## 2023-04-15 ENCOUNTER — Ambulatory Visit
Admission: RE | Admit: 2023-04-15 | Discharge: 2023-04-15 | Discharge status: Home / Self Care | Payer: Medicare HMO | Source: Ambulatory Visit | Attending: Radiation Oncology | Admitting: Radiation Oncology

## 2023-04-15 ENCOUNTER — Ambulatory Visit
Admission: RE | Admit: 2023-04-15 | Discharge: 2023-04-15 | Disposition: A | Discharge status: Home / Self Care | Payer: Medicare HMO | Source: Ambulatory Visit | Attending: Radiation Oncology | Admitting: Radiation Oncology

## 2023-04-15 VITALS — BP 175/72 | HR 66 | Temp 97.8°F | Resp 20 | Ht 69.0 in | Wt 221.4 lb

## 2023-04-15 DIAGNOSIS — G629 Polyneuropathy, unspecified: Secondary | ICD-10-CM | POA: Diagnosis not present

## 2023-04-15 DIAGNOSIS — Z7901 Long term (current) use of anticoagulants: Secondary | ICD-10-CM | POA: Insufficient documentation

## 2023-04-15 DIAGNOSIS — M129 Arthropathy, unspecified: Secondary | ICD-10-CM | POA: Diagnosis not present

## 2023-04-15 DIAGNOSIS — Z79899 Other long term (current) drug therapy: Secondary | ICD-10-CM | POA: Insufficient documentation

## 2023-04-15 DIAGNOSIS — E119 Type 2 diabetes mellitus without complications: Secondary | ICD-10-CM | POA: Insufficient documentation

## 2023-04-15 DIAGNOSIS — I10 Essential (primary) hypertension: Secondary | ICD-10-CM | POA: Insufficient documentation

## 2023-04-15 DIAGNOSIS — Z794 Long term (current) use of insulin: Secondary | ICD-10-CM | POA: Diagnosis not present

## 2023-04-15 DIAGNOSIS — I739 Peripheral vascular disease, unspecified: Secondary | ICD-10-CM | POA: Diagnosis not present

## 2023-04-15 DIAGNOSIS — Z923 Personal history of irradiation: Secondary | ICD-10-CM | POA: Insufficient documentation

## 2023-04-15 DIAGNOSIS — I509 Heart failure, unspecified: Secondary | ICD-10-CM | POA: Diagnosis not present

## 2023-04-15 DIAGNOSIS — C9001 Multiple myeloma in remission: Secondary | ICD-10-CM | POA: Diagnosis not present

## 2023-04-15 DIAGNOSIS — Z7961 Long term (current) use of immunomodulator: Secondary | ICD-10-CM | POA: Diagnosis not present

## 2023-04-15 DIAGNOSIS — G4730 Sleep apnea, unspecified: Secondary | ICD-10-CM | POA: Insufficient documentation

## 2023-04-15 DIAGNOSIS — R011 Cardiac murmur, unspecified: Secondary | ICD-10-CM | POA: Diagnosis not present

## 2023-04-15 DIAGNOSIS — Z7984 Long term (current) use of oral hypoglycemic drugs: Secondary | ICD-10-CM | POA: Insufficient documentation

## 2023-04-15 DIAGNOSIS — Z87891 Personal history of nicotine dependence: Secondary | ICD-10-CM | POA: Diagnosis not present

## 2023-04-15 DIAGNOSIS — K219 Gastro-esophageal reflux disease without esophagitis: Secondary | ICD-10-CM | POA: Insufficient documentation

## 2023-04-16 DIAGNOSIS — C9001 Multiple myeloma in remission: Secondary | ICD-10-CM | POA: Diagnosis not present

## 2023-04-17 ENCOUNTER — Ambulatory Visit
Admission: RE | Admit: 2023-04-17 | Discharge: 2023-04-17 | Disposition: A | Discharge status: Home / Self Care | Payer: Medicare HMO | Source: Ambulatory Visit | Attending: Radiation Oncology | Admitting: Radiation Oncology

## 2023-04-17 DIAGNOSIS — C9002 Multiple myeloma in relapse: Secondary | ICD-10-CM | POA: Insufficient documentation

## 2023-04-17 DIAGNOSIS — C9001 Multiple myeloma in remission: Secondary | ICD-10-CM | POA: Diagnosis not present

## 2023-04-21 DIAGNOSIS — Z510 Encounter for antineoplastic radiation therapy: Secondary | ICD-10-CM | POA: Diagnosis not present

## 2023-04-21 DIAGNOSIS — C9001 Multiple myeloma in remission: Secondary | ICD-10-CM | POA: Diagnosis not present

## 2023-04-21 DIAGNOSIS — C9002 Multiple myeloma in relapse: Secondary | ICD-10-CM | POA: Diagnosis not present

## 2023-04-24 ENCOUNTER — Ambulatory Visit: Payer: Medicare HMO | Admitting: Radiation Oncology

## 2023-04-25 ENCOUNTER — Ambulatory Visit: Payer: Medicare HMO

## 2023-04-28 ENCOUNTER — Ambulatory Visit: Payer: Medicare HMO

## 2023-04-28 DIAGNOSIS — H35373 Puckering of macula, bilateral: Secondary | ICD-10-CM | POA: Diagnosis not present

## 2023-04-28 DIAGNOSIS — H524 Presbyopia: Secondary | ICD-10-CM | POA: Diagnosis not present

## 2023-04-28 DIAGNOSIS — H52223 Regular astigmatism, bilateral: Secondary | ICD-10-CM | POA: Diagnosis not present

## 2023-04-29 ENCOUNTER — Other Ambulatory Visit: Payer: Self-pay

## 2023-04-29 ENCOUNTER — Ambulatory Visit: Payer: Medicare HMO

## 2023-04-29 ENCOUNTER — Ambulatory Visit
Admission: RE | Admit: 2023-04-29 | Discharge: 2023-04-29 | Disposition: A | Discharge status: Home / Self Care | Payer: Medicare HMO | Source: Ambulatory Visit | Attending: Radiation Oncology | Admitting: Radiation Oncology

## 2023-04-29 DIAGNOSIS — Z510 Encounter for antineoplastic radiation therapy: Secondary | ICD-10-CM | POA: Diagnosis not present

## 2023-04-29 DIAGNOSIS — C9001 Multiple myeloma in remission: Secondary | ICD-10-CM | POA: Diagnosis not present

## 2023-04-29 DIAGNOSIS — C9002 Multiple myeloma in relapse: Secondary | ICD-10-CM | POA: Diagnosis not present

## 2023-04-29 LAB — RAD ONC ARIA SESSION SUMMARY
Course Elapsed Days: 0
Plan Fractions Treated to Date: 1
Plan Prescribed Dose Per Fraction: 3 Gy
Plan Total Fractions Prescribed: 10
Plan Total Prescribed Dose: 30 Gy
Reference Point Dosage Given to Date: 3 Gy
Reference Point Session Dosage Given: 3 Gy
Session Number: 1

## 2023-04-30 ENCOUNTER — Ambulatory Visit
Admission: RE | Admit: 2023-04-30 | Discharge: 2023-04-30 | Disposition: A | Discharge status: Home / Self Care | Payer: Medicare HMO | Source: Ambulatory Visit | Attending: Radiation Oncology | Admitting: Radiation Oncology

## 2023-04-30 ENCOUNTER — Other Ambulatory Visit: Payer: Self-pay

## 2023-04-30 DIAGNOSIS — Z510 Encounter for antineoplastic radiation therapy: Secondary | ICD-10-CM | POA: Diagnosis not present

## 2023-04-30 DIAGNOSIS — C9001 Multiple myeloma in remission: Secondary | ICD-10-CM | POA: Diagnosis not present

## 2023-04-30 DIAGNOSIS — C9002 Multiple myeloma in relapse: Secondary | ICD-10-CM | POA: Diagnosis not present

## 2023-04-30 LAB — RAD ONC ARIA SESSION SUMMARY
Course Elapsed Days: 1
Plan Fractions Treated to Date: 2
Plan Prescribed Dose Per Fraction: 3 Gy
Plan Total Fractions Prescribed: 10
Plan Total Prescribed Dose: 30 Gy
Reference Point Dosage Given to Date: 6 Gy
Reference Point Session Dosage Given: 3 Gy
Session Number: 2

## 2023-05-01 ENCOUNTER — Other Ambulatory Visit: Payer: Self-pay

## 2023-05-01 ENCOUNTER — Ambulatory Visit
Admission: RE | Admit: 2023-05-01 | Discharge: 2023-05-01 | Disposition: A | Discharge status: Home / Self Care | Payer: Medicare HMO | Source: Ambulatory Visit | Attending: Radiation Oncology | Admitting: Radiation Oncology

## 2023-05-01 DIAGNOSIS — C9002 Multiple myeloma in relapse: Secondary | ICD-10-CM | POA: Diagnosis not present

## 2023-05-01 DIAGNOSIS — C9001 Multiple myeloma in remission: Secondary | ICD-10-CM | POA: Diagnosis not present

## 2023-05-01 DIAGNOSIS — Z510 Encounter for antineoplastic radiation therapy: Secondary | ICD-10-CM | POA: Diagnosis not present

## 2023-05-01 LAB — RAD ONC ARIA SESSION SUMMARY
Course Elapsed Days: 2
Plan Fractions Treated to Date: 3
Plan Prescribed Dose Per Fraction: 3 Gy
Plan Total Fractions Prescribed: 10
Plan Total Prescribed Dose: 30 Gy
Reference Point Dosage Given to Date: 9 Gy
Reference Point Session Dosage Given: 3 Gy
Session Number: 3

## 2023-05-02 ENCOUNTER — Other Ambulatory Visit: Payer: Self-pay | Admitting: Hematology

## 2023-05-02 ENCOUNTER — Ambulatory Visit
Admission: RE | Admit: 2023-05-02 | Discharge: 2023-05-02 | Disposition: A | Discharge status: Home / Self Care | Payer: Medicare HMO | Source: Ambulatory Visit | Attending: Radiation Oncology

## 2023-05-02 ENCOUNTER — Other Ambulatory Visit: Payer: Self-pay

## 2023-05-02 DIAGNOSIS — C9001 Multiple myeloma in remission: Secondary | ICD-10-CM

## 2023-05-02 DIAGNOSIS — C9002 Multiple myeloma in relapse: Secondary | ICD-10-CM | POA: Diagnosis not present

## 2023-05-02 DIAGNOSIS — Z510 Encounter for antineoplastic radiation therapy: Secondary | ICD-10-CM | POA: Diagnosis not present

## 2023-05-02 LAB — RAD ONC ARIA SESSION SUMMARY
Course Elapsed Days: 3
Plan Fractions Treated to Date: 4
Plan Prescribed Dose Per Fraction: 3 Gy
Plan Total Fractions Prescribed: 10
Plan Total Prescribed Dose: 30 Gy
Reference Point Dosage Given to Date: 12 Gy
Reference Point Session Dosage Given: 3 Gy
Session Number: 4

## 2023-05-05 ENCOUNTER — Ambulatory Visit
Admission: RE | Admit: 2023-05-05 | Discharge: 2023-05-05 | Disposition: A | Discharge status: Home / Self Care | Payer: Medicare HMO | Source: Ambulatory Visit | Attending: Radiation Oncology

## 2023-05-05 ENCOUNTER — Other Ambulatory Visit: Payer: Self-pay

## 2023-05-05 DIAGNOSIS — Z510 Encounter for antineoplastic radiation therapy: Secondary | ICD-10-CM | POA: Diagnosis not present

## 2023-05-05 DIAGNOSIS — C9001 Multiple myeloma in remission: Secondary | ICD-10-CM | POA: Diagnosis not present

## 2023-05-05 DIAGNOSIS — C9002 Multiple myeloma in relapse: Secondary | ICD-10-CM | POA: Diagnosis not present

## 2023-05-05 LAB — RAD ONC ARIA SESSION SUMMARY
Course Elapsed Days: 6
Plan Fractions Treated to Date: 5
Plan Prescribed Dose Per Fraction: 3 Gy
Plan Total Fractions Prescribed: 10
Plan Total Prescribed Dose: 30 Gy
Reference Point Dosage Given to Date: 15 Gy
Reference Point Session Dosage Given: 3 Gy
Session Number: 5

## 2023-05-06 ENCOUNTER — Ambulatory Visit
Admission: RE | Admit: 2023-05-06 | Discharge: 2023-05-06 | Disposition: A | Discharge status: Home / Self Care | Payer: Medicare HMO | Source: Ambulatory Visit | Attending: Radiation Oncology

## 2023-05-06 ENCOUNTER — Other Ambulatory Visit: Payer: Self-pay

## 2023-05-06 DIAGNOSIS — Z510 Encounter for antineoplastic radiation therapy: Secondary | ICD-10-CM | POA: Diagnosis not present

## 2023-05-06 DIAGNOSIS — C9002 Multiple myeloma in relapse: Secondary | ICD-10-CM | POA: Diagnosis not present

## 2023-05-06 DIAGNOSIS — C9001 Multiple myeloma in remission: Secondary | ICD-10-CM | POA: Diagnosis not present

## 2023-05-06 LAB — RAD ONC ARIA SESSION SUMMARY
Course Elapsed Days: 7
Plan Fractions Treated to Date: 6
Plan Prescribed Dose Per Fraction: 3 Gy
Plan Total Fractions Prescribed: 10
Plan Total Prescribed Dose: 30 Gy
Reference Point Dosage Given to Date: 18 Gy
Reference Point Session Dosage Given: 3 Gy
Session Number: 6

## 2023-05-07 ENCOUNTER — Ambulatory Visit: Payer: Medicare HMO

## 2023-05-07 ENCOUNTER — Other Ambulatory Visit: Payer: Self-pay

## 2023-05-07 ENCOUNTER — Ambulatory Visit
Admission: RE | Admit: 2023-05-07 | Discharge: 2023-05-07 | Disposition: A | Discharge status: Home / Self Care | Payer: Medicare HMO | Source: Ambulatory Visit | Attending: Radiation Oncology

## 2023-05-07 DIAGNOSIS — Z510 Encounter for antineoplastic radiation therapy: Secondary | ICD-10-CM | POA: Diagnosis not present

## 2023-05-07 DIAGNOSIS — C9002 Multiple myeloma in relapse: Secondary | ICD-10-CM | POA: Diagnosis not present

## 2023-05-07 DIAGNOSIS — C9001 Multiple myeloma in remission: Secondary | ICD-10-CM | POA: Diagnosis not present

## 2023-05-07 LAB — RAD ONC ARIA SESSION SUMMARY
Course Elapsed Days: 8
Plan Fractions Treated to Date: 7
Plan Prescribed Dose Per Fraction: 3 Gy
Plan Total Fractions Prescribed: 10
Plan Total Prescribed Dose: 30 Gy
Reference Point Dosage Given to Date: 21 Gy
Reference Point Session Dosage Given: 3 Gy
Session Number: 7

## 2023-05-08 ENCOUNTER — Other Ambulatory Visit: Payer: Self-pay

## 2023-05-08 ENCOUNTER — Ambulatory Visit
Admission: RE | Admit: 2023-05-08 | Discharge: 2023-05-08 | Disposition: A | Discharge status: Home / Self Care | Payer: Medicare HMO | Source: Ambulatory Visit | Attending: Radiation Oncology | Admitting: Radiation Oncology

## 2023-05-08 DIAGNOSIS — C9001 Multiple myeloma in remission: Secondary | ICD-10-CM | POA: Diagnosis not present

## 2023-05-08 DIAGNOSIS — C9002 Multiple myeloma in relapse: Secondary | ICD-10-CM | POA: Diagnosis not present

## 2023-05-08 DIAGNOSIS — Z510 Encounter for antineoplastic radiation therapy: Secondary | ICD-10-CM | POA: Diagnosis not present

## 2023-05-08 LAB — RAD ONC ARIA SESSION SUMMARY
Course Elapsed Days: 9
Plan Fractions Treated to Date: 8
Plan Prescribed Dose Per Fraction: 3 Gy
Plan Total Fractions Prescribed: 10
Plan Total Prescribed Dose: 30 Gy
Reference Point Dosage Given to Date: 24 Gy
Reference Point Session Dosage Given: 3 Gy
Session Number: 8

## 2023-05-09 ENCOUNTER — Ambulatory Visit
Admission: RE | Admit: 2023-05-09 | Discharge: 2023-05-09 | Disposition: A | Discharge status: Home / Self Care | Payer: Medicare HMO | Source: Ambulatory Visit | Attending: Radiation Oncology

## 2023-05-09 ENCOUNTER — Other Ambulatory Visit: Payer: Self-pay

## 2023-05-09 DIAGNOSIS — Z510 Encounter for antineoplastic radiation therapy: Secondary | ICD-10-CM | POA: Diagnosis not present

## 2023-05-09 DIAGNOSIS — C9001 Multiple myeloma in remission: Secondary | ICD-10-CM | POA: Diagnosis not present

## 2023-05-09 DIAGNOSIS — C9002 Multiple myeloma in relapse: Secondary | ICD-10-CM | POA: Diagnosis not present

## 2023-05-09 LAB — RAD ONC ARIA SESSION SUMMARY
Course Elapsed Days: 10
Plan Fractions Treated to Date: 9
Plan Prescribed Dose Per Fraction: 3 Gy
Plan Total Fractions Prescribed: 10
Plan Total Prescribed Dose: 30 Gy
Reference Point Dosage Given to Date: 27 Gy
Reference Point Session Dosage Given: 3 Gy
Session Number: 9

## 2023-05-12 ENCOUNTER — Other Ambulatory Visit: Payer: Self-pay

## 2023-05-12 ENCOUNTER — Ambulatory Visit
Admission: RE | Admit: 2023-05-12 | Discharge: 2023-05-12 | Disposition: A | Discharge status: Home / Self Care | Payer: Medicare HMO | Source: Ambulatory Visit | Attending: Radiation Oncology | Admitting: Radiation Oncology

## 2023-05-12 DIAGNOSIS — C9002 Multiple myeloma in relapse: Secondary | ICD-10-CM | POA: Diagnosis not present

## 2023-05-12 DIAGNOSIS — Z510 Encounter for antineoplastic radiation therapy: Secondary | ICD-10-CM | POA: Diagnosis not present

## 2023-05-12 DIAGNOSIS — C9001 Multiple myeloma in remission: Secondary | ICD-10-CM | POA: Diagnosis not present

## 2023-05-12 LAB — RAD ONC ARIA SESSION SUMMARY
Course Elapsed Days: 13
Plan Fractions Treated to Date: 10
Plan Prescribed Dose Per Fraction: 3 Gy
Plan Total Fractions Prescribed: 10
Plan Total Prescribed Dose: 30 Gy
Reference Point Dosage Given to Date: 30 Gy
Reference Point Session Dosage Given: 3 Gy
Session Number: 10

## 2023-05-14 NOTE — Radiation Completion Notes (Addendum)
  Radiation Oncology         [phone]) [phone] ________________________________  Name: [name] MRN: [id number]  Date of Service: 05/12/2023  DOB: 1956/04/12  End of Treatment Note    Diagnosis:  Painful bone disease resulting from multiple myeloma that has been reactivated following autologous transplant   Intent: Palliative     ==========DELIVERED PLANS==========  First Treatment Date: 2023-04-29 - Last Treatment Date: 2023-05-12   Plan Name: [id number] Site: Maxilla Technique: 3D Mode: Photon Dose Per Fraction: 3 Gy Prescribed Dose (Delivered / Prescribed): 30 Gy / 30 Gy Prescribed Fxs (Delivered / Prescribed): 10 / 10     ==========ON TREATMENT VISIT DATES========== 2023-05-02, 2023-05-09   See weekly On Treatment Notes in Epic for details. The patient tolerated radiation. She developed fatigue and some maxillary discomfort was still noted at the conclusion of treatment.   The patient will receive a call in about one month from the radiation oncology department. She will continue follow up with Dr. Candise Che as well.      Osker Mason, PAC

## 2023-05-29 ENCOUNTER — Other Ambulatory Visit: Payer: Self-pay | Admitting: Hematology

## 2023-05-29 DIAGNOSIS — C9001 Multiple myeloma in remission: Secondary | ICD-10-CM

## 2023-05-30 ENCOUNTER — Encounter: Payer: Self-pay | Admitting: Hematology

## 2023-06-03 ENCOUNTER — Other Ambulatory Visit: Payer: Self-pay

## 2023-06-03 DIAGNOSIS — C9002 Multiple myeloma in relapse: Secondary | ICD-10-CM

## 2023-06-04 ENCOUNTER — Inpatient Hospital Stay (HOSPITAL_BASED_OUTPATIENT_CLINIC_OR_DEPARTMENT_OTHER): Payer: Medicare HMO | Attending: Hematology | Admitting: Hematology

## 2023-06-04 ENCOUNTER — Inpatient Hospital Stay: Payer: Medicare HMO

## 2023-06-04 ENCOUNTER — Inpatient Hospital Stay: Payer: Medicare HMO | Attending: Hematology

## 2023-06-04 VITALS — BP 152/94 | HR 54 | Temp 97.2°F | Resp 20 | Wt 220.0 lb

## 2023-06-04 DIAGNOSIS — C9002 Multiple myeloma in relapse: Secondary | ICD-10-CM

## 2023-06-04 DIAGNOSIS — Z95828 Presence of other vascular implants and grafts: Secondary | ICD-10-CM

## 2023-06-04 DIAGNOSIS — C9001 Multiple myeloma in remission: Secondary | ICD-10-CM | POA: Diagnosis present

## 2023-06-04 DIAGNOSIS — Z79899 Other long term (current) drug therapy: Secondary | ICD-10-CM | POA: Insufficient documentation

## 2023-06-04 LAB — CBC WITH DIFFERENTIAL (CANCER CENTER ONLY)
Abs Immature Granulocytes: 0.01 10*3/uL (ref 0.00–0.07)
Basophils Absolute: 0.1 10*3/uL (ref 0.0–0.1)
Basophils Relative: 1 %
Eosinophils Absolute: 0.5 10*3/uL (ref 0.0–0.5)
Eosinophils Relative: 9 %
HCT: 33.4 % — ABNORMAL LOW (ref 36.0–46.0)
Hemoglobin: 11.4 g/dL — ABNORMAL LOW (ref 12.0–15.0)
Immature Granulocytes: 0 %
Lymphocytes Relative: 34 %
Lymphs Abs: 1.7 10*3/uL (ref 0.7–4.0)
MCH: 32.3 pg (ref 26.0–34.0)
MCHC: 34.1 g/dL (ref 30.0–36.0)
MCV: 94.6 fL (ref 80.0–100.0)
Monocytes Absolute: 0.4 10*3/uL (ref 0.1–1.0)
Monocytes Relative: 8 %
Neutro Abs: 2.3 10*3/uL (ref 1.7–7.7)
Neutrophils Relative %: 48 %
Platelet Count: 182 10*3/uL (ref 150–400)
RBC: 3.53 MIL/uL — ABNORMAL LOW (ref 3.87–5.11)
RDW: 13.6 % (ref 11.5–15.5)
WBC Count: 4.9 10*3/uL (ref 4.0–10.5)
nRBC: 0 % (ref 0.0–0.2)

## 2023-06-04 LAB — CMP (CANCER CENTER ONLY)
ALT: 33 U/L (ref 0–44)
AST: 32 U/L (ref 15–41)
Albumin: 3.9 g/dL (ref 3.5–5.0)
Alkaline Phosphatase: 80 U/L (ref 38–126)
Anion gap: 7 (ref 5–15)
BUN: 6 mg/dL — ABNORMAL LOW (ref 8–23)
CO2: 24 mmol/L (ref 22–32)
Calcium: 9.4 mg/dL (ref 8.9–10.3)
Chloride: 111 mmol/L (ref 98–111)
Creatinine: 1.22 mg/dL — ABNORMAL HIGH (ref 0.44–1.00)
GFR, Estimated: 49 mL/min — ABNORMAL LOW (ref >60)
Glucose, Bld: 125 mg/dL — ABNORMAL HIGH (ref 70–99)
Potassium: 4 mmol/L (ref 3.5–5.1)
Sodium: 142 mmol/L (ref 135–145)
Total Bilirubin: 0.7 mg/dL (ref <1.2)
Total Protein: 6.8 g/dL (ref 6.5–8.1)

## 2023-06-04 MED ORDER — HEPARIN SOD (PORK) LOCK FLUSH 100 UNIT/ML IV SOLN
250.0000 [IU] | Freq: Once | INTRAVENOUS | Status: AC | PRN
  Administered 2023-06-04: 500 [IU]

## 2023-06-04 MED ORDER — SODIUM CHLORIDE 0.9% FLUSH
10.0000 mL | Freq: Once | INTRAVENOUS | Status: AC
  Administered 2023-06-04: 10 mL

## 2023-06-04 MED ORDER — SODIUM CHLORIDE 0.9 % IV SOLN: INTRAVENOUS | Status: DC

## 2023-06-04 MED ORDER — ZOLEDRONIC ACID 4 MG/100ML IV SOLN
4.0000 mg | Freq: Once | INTRAVENOUS | Status: AC
  Administered 2023-06-04: 4 mg via INTRAVENOUS
  Filled 2023-06-04: qty 100

## 2023-06-04 MED ORDER — SODIUM CHLORIDE 0.9% FLUSH
3.0000 mL | Freq: Once | INTRAVENOUS | Status: AC | PRN
  Administered 2023-06-04: 10 mL

## 2023-06-04 MED ORDER — PANCRELIPASE (LIP-PROT-AMYL) 36000-114000 UNITS PO CPEP: 36000.0000 [IU] | ORAL_CAPSULE | Freq: Three times a day (TID) | ORAL | 2 refills | Status: AC

## 2023-06-04 NOTE — Patient Instructions (Signed)

## 2023-06-04 NOTE — Progress Notes (Signed)
HEMATOLOGY/ONCOLOGY CLINIC VISIT NOTE  Date of Service: 06/04/23    Patient Care Team: Toma Deiters, MD as PCP - General (Internal Medicine) Glendale Chard, DO as Consulting Physician (Neurology) Rodney Langton, RN as [facility] Care Management  CHIEF COMPLAINTS/PURPOSE OF CONSULTATION:   Follow-up for continued evaluation and management of multiple myeloma  Oncologic History:  67 y.o. female who initially presented with significant weight loss in the context of H. pylori-associated gastritis and alcohol abuse. At the same time, an incidental discovery of a lower thoracic vertebral mass in the context of chronic numbness and weakness in the lower extremities. MRI of the thoracic spine demonstrated a destructive lesion at T8 level and patient underwent surgical treatment. 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 Staging evaluation conducted by our clinic confirmed presence of IgG kappa multiple myeloma. Cytogenetics studies demonstrated normal karyotype, FISH studies are positive for 13q34 & D13S319 loss and no loss of p53. Based on initial information, patient was staged at [facility].    Patient has completed 6 cycles of induction systemic chemotherapy with lenalidomide, bortezomib, and low-dose dexamethasone.  Repeat bone marrow biopsy demonstrated excellent response to treatment and patient subsequently underwent consolidation therapy including melphalan conditioning and autologous stem cell rescue.  Repeat assessment with PET/CT and bone marrow biopsy on 10/13/2017 demonstrates stringent complete response maintenance.  HISTORY OF PRESENTING ILLNESS:  Please see previous note for details on initial presentation  INTERVAL HISTORY:   Christine Garrett is a 67 y.o. female here for continued evaluation and management of her multiple myeloma. Patient was last seen by me on 04/02/2023 and complained of nausea sometimes, right sinus pain,  lack of sense of smell, persistent sniffles, stable bone pain, and worsened neuropathy.  Today, she reports that she completed radiation therapy on 11/25, and her rt maxilla swelling has mildly improved.Patient reports mild blood when blowing her nose.  She reports increased nasal congestion at night and in the early mornings. Patient notes endorsing clots of mucus, which she manages with washing with water.   Patient has been tolerating Revlimid well without any new or major toxicity issues. She denies any major muscle cramping. She reports that her energy levels are normal.   Patient complains of lack of appetite due to concerns of diarrhea. Patient reports having 3-4 bowel movements a day and notes that she has an urge for a bowel movement a couple hours after eating. She notes endorsing constant bowel movements on thansgiving. She reports that during the week off of Revlimid, her diarrhea is unchanged. She notes that she typically consumes two meals a day. She has lost 3 pounds since 04/02/2023 and currently weighs 220 pounds. She uses 2-3 Ensure/Boost nutritional supplements to help optimize her nutrition.   After eating, she sometimes has a sense of feeling bloated. She notes endorsing some nausea after consuming heavy meals. Patient denies acid reflux. She takes nausea medication.   She reports that her last colonoscopy 3 years ago showed findings of polyps. Patient notes that her gastroenterologist is with [facility]. Patient reports wanting a repeat colonoscopy.   Patient notes that she is not taking Metformin. She continues to take vitamin D regularly.   Patient denies any issues with her port or new dental issues.   MEDICAL HISTORY:  Past Medical History:  Diagnosis Date   Arthritis    knees   Cancer (HCC)    Carpal tunnel syndrome    CHF (congestive heart failure) (HCC)  Diabetes mellitus    type 2   GERD (gastroesophageal reflux disease)    Heart murmur    "Little, No  concerns" per Dr  Andee Lineman pt reported.   Hypertension    controlled using a guided approch with plasma renin activity   Multiple myeloma not having achieved remission (HCC) 01/06/2017   Neuropathy    Peripheral vascular disease (HCC)    Sleep apnea    01/03/2020: per patient, hasn't used CPAP machine in two years due to inability to breathe well with it on    SURGICAL HISTORY: Past Surgical History:  Procedure Laterality Date   BIOPSY  11/28/2016   Procedure: BIOPSY;  Surgeon: Malissa Hippo, MD;  [facility]: [facility];  Service: Endoscopy;;  gastric   BIOPSY  07/02/2019   Procedure: BIOPSY;  Surgeon: Malissa Hippo, MD;  [facility]: [facility];  Service: Endoscopy;;   BTL     1982   CATARACT EXTRACTION W/PHACO Left 03/10/2023   Procedure: CATARACT EXTRACTION PHACO AND INTRAOCULAR LENS PLACEMENT (IOC);  Surgeon: Fabio Pierce, MD;  Location: [facility];  Service: Ophthalmology;  Laterality: Left;  CDE: 9.43   CERVICAL CONE BIOPSY     cervical lesion   COLONOSCOPY WITH PROPOFOL N/A 07/02/2019   Procedure: COLONOSCOPY WITH PROPOFOL;  Surgeon: Malissa Hippo, MD;  [facility]: [facility];  Service: Endoscopy;  Laterality: N/A;  12:40 - pt can't come earlier due to transportation   ESOPHAGOGASTRODUODENOSCOPY (EGD) WITH PROPOFOL N/A 11/28/2016   Procedure: ESOPHAGOGASTRODUODENOSCOPY (EGD) WITH PROPOFOL;  Surgeon: Malissa Hippo, MD;  [facility];  Service: Endoscopy;  Laterality: N/A;  9:25   IR FLUORO GUIDE PORT INSERTION RIGHT  01/29/2017   IR US GUIDE VASC ACCESS RIGHT  01/29/2017   lipoma removal     right shoulder 2001   LUMBAR FUSION  01/05/2020    Lumbar Two-Three Lumbar Three-Four Lumbar Four-Five Posterior lumbar interbody fusion with decompression (N/A Spine Lumbar)   MULTIPLE EXTRACTIONS WITH ALVEOLOPLASTY Bilateral 08/24/2012   Procedure: MULTIPLE EXTRACTION WITH ALVEOLOPLASTY BIOPSY OF RIGHT AND LEFT MANDIBLE ;  Surgeon: Georgia Lopes, DDS;  Location: [facility];   Service: Oral Surgery;  Laterality: Bilateral;   POLYPECTOMY  07/02/2019   Procedure: POLYPECTOMY;  Surgeon: Malissa Hippo, MD;  [facility];  Service: Endoscopy;;   POSTERIOR LUMBAR FUSION 4 LEVEL N/A 12/26/2016   Procedure: THORACIC EIGHT TUMOR RESECTION, THORACIC SIX- THORACIC TEN POSTERIOR SPINAL FUSION;  Surgeon: Coletta Memos, MD;  Location: [facility];  Service: Neurosurgery;  Laterality: N/A;  THORACIC 8 TUMOR RESECTION, THORACIC 6- THORACIC 10 POSTERIOR SPINAL FUSION   ROTATOR CUFF REPAIR     right shoulder   TUBAL LIGATION      SOCIAL HISTORY: Social History   Socioeconomic History   Marital status: Single    Spouse name: Not on file   Number of children: 3   Years of education: Not on file   Highest education level: Not on file  Occupational History   Occupation: on disability   Occupation: former CNA  Tobacco Use   Smoking status: Former    Current packs/day: 0.00    Average packs/day: 0.5 packs/day for 6.0 years (3.0 ttl pk-yrs)    Types: Cigarettes    Start date: 2013    Quit date: 2019    Years since quitting: 5.9   Smokeless tobacco: Never  Vaping Use   Vaping status: Never Used  Substance and Sexual Activity   Alcohol use:  Yes    Alcohol/week: 6.0 standard drinks of alcohol    Types: 6 Cans of beer per week   Drug use: No   Sexual activity: Not Currently    Birth control/protection: Post-menopausal  Other Topics Concern   Not on file  Social History Narrative   Lives with son, daughter and grandkids in a 2 story home but stays on the first floor.  Has 3 children.  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      Right handed   Social Drivers of Health   Financial Resource Strain: Not on file  Food Insecurity: Food Insecurity Present (04/15/2023)   Hunger Vital Sign    Worried About Running Out of Food in the Last Year: Never true    Ran Out of Food in the Last Year: Sometimes true  Transportation Needs: No  Transportation Needs (04/15/2023)   PRAPARE - Administrator, Civil Service (Medical): No    Lack of Transportation (Non-Medical): No  Physical Activity: Not on file  Stress: Not on file  Social Connections: Unknown (05/21/2022)   Received [facility]   Social Network    Social Network: Not on file  Intimate Partner Violence: Not At Risk (04/15/2023)   Humiliation, Afraid, Rape, and Kick questionnaire    Fear of Current or Ex-Partner: No    Emotionally Abused: No    Physically Abused: No    Sexually Abused: No    FAMILY HISTORY: Family History  Problem Relation Age of Onset   Diabetes Mother    Hypertension Mother    Heart disease Mother    Alzheimer's disease Mother    Diabetes Father    Heart disease Sister    Diabetes Sister     ALLERGIES:  is allergic to hydrocodone.  MEDICATIONS:  Current Outpatient Medications  Medication Sig Dispense Refill   amLODipine (NORVASC) 5 MG tablet Take 5 mg by mouth daily.     apixaban (ELIQUIS) 5 MG TABS tablet Take 1 tablet (5 mg total) by mouth 2 (two) times daily.     atorvastatin (LIPITOR) 40 MG tablet Take 40 mg by mouth daily. (Patient not taking: Reported on 04/15/2023)     enalapril (VASOTEC) 20 MG tablet Take 20 mg by mouth 2 (two) times daily.      gabapentin (NEURONTIN) 300 MG capsule TAKE 2 CAPSULES (600 MG TOTAL) BY MOUTH 3 (THREE) TIMES DAILY. (Patient not taking: Reported on 04/15/2023) 540 capsule 0   hydrALAZINE (APRESOLINE) 25 MG tablet Take 25 mg by mouth 2 (two) times daily.     insulin degludec (TRESIBA FLEXTOUCH) 100 UNIT/ML SOPN FlexTouch Pen Inject 0.5 mLs (50 Units total) into the skin daily at 10 pm. (Patient taking differently: Inject 40 Units into the skin daily at 10 pm.) 5 pen 2   Insulin Pen Needle (B-D ULTRAFINE III SHORT PEN) 31G X 8 MM MISC 1 each by Does not apply route as directed. 100 each 3   lenalidomide (REVLIMID) 10 MG capsule TAKE 1 CAPSULE BY MOUTH 1 TIME A DAY FOR  21 DAYS ON THEN 7 DAYS OFF 21 capsule 0   metFORMIN (GLUCOPHAGE) 1000 MG tablet Take 1,000 mg by mouth 2 (two) times daily.      metoprolol succinate (TOPROL-XL) 50 MG 24 hr tablet Take 50 mg by mouth daily. Take with or immediately following a meal.     Omega-3 Fatty Acids (FISH OIL) 1000 MG CPDR  Take 1,000 mg by mouth daily. Omega 3 300 mg     ondansetron (ZOFRAN) 8 MG tablet Take 1 tablet (8 mg total) by mouth every 8 (eight) hours as needed for nausea or vomiting. 30 tablet 0   potassium chloride (MICRO-K) 10 MEQ CR capsule Take 10 mEq by mouth daily. (Patient not taking: Reported on 04/15/2023)     Vitamin D, Ergocalciferol, (DRISDOL) 50000 units CAPS capsule Take 50,000 Units by mouth every Monday.      No current facility-administered medications for this visit.    REVIEW OF SYSTEMS:    10 Point review of Systems was done is negative except as noted above.   PHYSICAL EXAMINATION:  .BP (!) 152/94   Pulse (!) 54   Temp (!) 97.2 F (36.2 C)   Resp 20   Wt 220 lb (99.8 kg)   SpO2 100%   BMI 32.49 kg/m    GENERAL:alert, in no acute distress and comfortable SKIN: no acute rashes, no significant lesions EYES: conjunctiva are pink and non-injected, sclera anicteric OROPHARYNX: MMM, no exudates, no oropharyngeal erythema or ulceration NECK: supple, no JVD LYMPH:  no palpable lymphadenopathy in the cervical, axillary or inguinal regions LUNGS: clear to auscultation b/l with normal respiratory effort HEART: regular rate & rhythm ABDOMEN:  normoactive bowel sounds , non tender, not distended. Extremity: no pedal edema PSYCH: alert & oriented x 3 with fluent speech NEURO: no focal motor/sensory deficits    LABORATORY DATA:  I have reviewed the data as listed      Latest Ref Rng & Units 06/04/2023   10:06 AM 04/02/2023    9:54 AM 02/26/2023   12:11 PM  CBC  WBC 4.0 - 10.5 K/uL 4.9  5.8  7.7   Hemoglobin 12.0 - 15.0 g/dL 29.5  62.1  30.8   Hematocrit 36.0 - 46.0 % 33.4   36.9  36.6   Platelets 150 - 400 K/uL 182  269  250     .    Latest Ref Rng & Units 06/04/2023   10:06 AM 04/02/2023    9:54 AM 02/26/2023   12:11 PM  CMP  Glucose 70 - 99 mg/dL 657  846  962   BUN 8 - 23 mg/dL 6  8  8    Creatinine 0.44 - 1.00 mg/dL 9.52  8.41  3.24   Sodium 135 - 145 mmol/L 142  140  140   Potassium 3.5 - 5.1 mmol/L 4.0  3.7  4.0   Chloride 98 - 111 mmol/L 111  108  108   CO2 22 - 32 mmol/L 24  21  25    Calcium 8.9 - 10.3 mg/dL 9.4  9.3  9.5   Total Protein 6.5 - 8.1 g/dL 6.8  7.5  7.5   Total Bilirubin <1.2 mg/dL 0.7  0.6  0.6   Alkaline Phos 38 - 126 U/L 80  62  64   AST 15 - 41 U/L 32  27  19   ALT 0 - 44 U/L 33  25  15    Bone marrow biopsy 02/18/2023:    03/25/18 SPEP and SFLC:     04/21/17 Cytogenetics:    01/17/17 Cytogenetics:    RADIOGRAPHIC STUDIES: I have personally reviewed the radiological images as listed and agreed with the findings in the report.  No results found.  10/13/17 PET/CT     ASSESSMENT & PLAN:   67 y.o. female with  1. Multiple myeloma Currently in remission Autologous PBSCT on  07/01/17. IgG kappa multiple myeloma. Cytogenetics studies demonstrated normal karyotype, FISH studies are positive for 13q34 & D13S319 loss and no loss of p53. Based on initial information, patient was staged at [facility].  Completed 6 cycles of induction with Revlimid, Velcade and dexamethasone  12/09/2018 lumbar spine xray with results revealing "No acute osseous abnormality. Extensive multilevel degenerative changes are noted throughout the lumbar spine."  12/09/2018 sacrum and coccyx xray with results revealing "No acute osseous abnormality. Degenerative changes are noted of both hips."  2. Grade 2 neuropathy -controlled  02/26/18 NCV with EMG which revealed The electrophysiologic findings are most consistent with a chronic, length-dependent sensorimotor axonal and demyelinating polyneuropathy affecting the right side. Overall, these  findings are moderate to severe in degree electrically. Incidentally, there is a right Martin-Gruber anastomosis, a normal variant.   3. Rt LE calf veins - possible clot ?chronic.  01/29/18 VAS [place] BLE which revealed Right: Ultrasound is unable to distinguish whether obstruction in the Peroneal veins, and great saphenous vein, and superficial veins/varciosities is acute or chronic. No cystic structure found in the popliteal fossa. Left: no evidence of DVT.   PLAN:  -Discussed lab results on 06/04/23 in detail with patient. CBC stable, showed WBC of 4.9K, hemoglobin of 11.4, and platelets of 182K. -CMP stable -myeloma panel from today is pending at this time -myeloma panel from two months ago showed stable M protein of 0.3 g/dL -patient has no new or major toxicities from Revlimid. Diarrhea is likely not related to Revlimid.  -continue maintenance Revlimid treatment at 10 MG -continue anti-nausea medications as needed -will try low-dose pancreatic enzyme supplement to take with main meals for the next month to help with food digestion -diarrhea may be from Steatorrhea in which fat malabsorption which can occur from DM  -there may be a role for referral to gastroenterology if her diarrhea is persistent -patient shall connect with gastroenterologist at [facility] to schedule colonoscopy -recommend using an OTC Deep Sea Saline Nasal Spray 3-4 times a day for the next couple of weeks to decongest and reduce the risk of infections -her right maxilla appeared to be less swollen during physical examination -rt maxilla swelling should continue to shrink over three months after radiation therapy -continue Zometa every 12 weeks -will plan for PET scan in two months prior to her next visit  FOLLOW-UP: -PET/CT in 8 weeks -continue Zometa every 12 weeks -- plz schedule next 4 doses -RTC with Dr Candise Che with labs with next dose of Zometa in 12 weeks  The total time spent in the appointment was 30 minutes*  .  All of the patient's questions were answered with apparent satisfaction. The patient knows to call the clinic with any problems, questions or concerns.   Wyvonnia Lora MD MS AAHIVMS [facility] Hematology/Oncology Physician [facility]  .*Total Encounter Time as defined by the Centers for Medicare and Medicaid Services includes, in addition to the face-to-face time of a patient visit (documented in the note above) non-face-to-face time: obtaining and reviewing outside history, ordering and reviewing medications, tests or procedures, care coordination (communications with other health care professionals or caregivers) and documentation in the medical record.    I,Mitra Faeizi,acting as a Neurosurgeon for Wyvonnia Lora, MD.,have documented all relevant documentation on the behalf of Wyvonnia Lora, MD,as directed by  Wyvonnia Lora, MD while in the presence of Wyvonnia Lora, MD.  .I have reviewed the above documentation for accuracy and completeness, and I agree with the above. Rance Muir  Kermit Balo MD

## 2023-06-04 NOTE — Progress Notes (Signed)
Patient seen by Dr. Kale  Vitals are within treatment parameters.  Labs reviewed: and are within treatment parameters.  Per physician team, patient is ready for treatment and there are NO modifications to the treatment plan.  

## 2023-06-05 LAB — KAPPA/LAMBDA LIGHT CHAINS
Kappa free light chain: 56.6 mg/L — ABNORMAL HIGH (ref 3.3–19.4)
Kappa, lambda light chain ratio: 1.64 (ref 0.26–1.65)
Lambda free light chains: 34.5 mg/L — ABNORMAL HIGH (ref 5.7–26.3)

## 2023-06-06 ENCOUNTER — Telehealth: Payer: Self-pay | Admitting: Hematology

## 2023-06-06 NOTE — Telephone Encounter (Signed)
Spoke with patient confirming upcoming appointment  

## 2023-06-09 ENCOUNTER — Ambulatory Visit
Admission: RE | Admit: 2023-06-09 | Discharge: 2023-06-09 | Disposition: A | Discharge status: Home / Self Care | Payer: Medicare HMO | Source: Ambulatory Visit | Attending: Hematology | Admitting: Hematology

## 2023-06-09 NOTE — Progress Notes (Signed)
  Radiation Oncology         [phone]) [phone] ________________________________  Name: Christine Garrett MRN: [id number]  Date of Service: 06/09/2023  DOB: 05-17-1956  Post Treatment Telephone Note  Diagnosis:  Painful bone disease resulting from multiple myeloma that has been reactivated following autologous transplant (as documented in provider EOT note)  The patient was available for call today.  Patient reports fatigue and a reduced appetite but is otherwise doing well. The patient did not note skin changes in the field of radiation during therapy. The patient has noticed improvement in pain in the area(s) treated with radiation. The patient is not taking dexamethasone. The patient does not have symptoms of  weakness or loss of control of the extremities. The patient does not have symptoms of headache. The patient does not have symptoms of seizure or uncontrolled movement. The patient does not have symptoms of changes in vision. The patient does not have changes in speech. The patient does not have confusion.   The patient is scheduled for ongoing care with Dr. Candise Che  in medical oncology. The patient was encouraged to call if she develops concerns or questions regarding radiation.   This concludes the interaction.  Ruel Favors, LPN

## 2023-06-10 ENCOUNTER — Encounter: Payer: Self-pay | Admitting: Hematology

## 2023-06-17 LAB — MULTIPLE MYELOMA PANEL, SERUM
Albumin SerPl Elph-Mcnc: 3.5 g/dL (ref 2.9–4.4)
Albumin/Glob SerPl: 1.1 (ref 0.7–1.7)
Alpha 1: 0.3 g/dL (ref 0.0–0.4)
Alpha2 Glob SerPl Elph-Mcnc: 0.8 g/dL (ref 0.4–1.0)
B-Globulin SerPl Elph-Mcnc: 1 g/dL (ref 0.7–1.3)
Gamma Glob SerPl Elph-Mcnc: 1.2 g/dL (ref 0.4–1.8)
Globulin, Total: 3.2 g/dL (ref 2.2–3.9)
IgA: 247 mg/dL (ref 87–352)
IgG (Immunoglobin G), Serum: 1243 mg/dL (ref 586–1602)
IgM (Immunoglobulin M), Srm: 36 mg/dL (ref 26–217)
M Protein SerPl Elph-Mcnc: 0.2 g/dL — ABNORMAL HIGH
Total Protein ELP: 6.7 g/dL (ref 6.0–8.5)

## 2023-06-25 ENCOUNTER — Other Ambulatory Visit: Payer: Self-pay

## 2023-06-25 DIAGNOSIS — C9001 Multiple myeloma in remission: Secondary | ICD-10-CM

## 2023-06-26 ENCOUNTER — Other Ambulatory Visit: Payer: Self-pay | Admitting: Hematology

## 2023-06-26 DIAGNOSIS — C9001 Multiple myeloma in remission: Secondary | ICD-10-CM

## 2023-07-08 DIAGNOSIS — E1143 Type 2 diabetes mellitus with diabetic autonomic (poly)neuropathy: Secondary | ICD-10-CM | POA: Diagnosis not present

## 2023-07-08 DIAGNOSIS — N1831 Chronic kidney disease, stage 3a: Secondary | ICD-10-CM | POA: Diagnosis not present

## 2023-07-08 DIAGNOSIS — I5042 Chronic combined systolic (congestive) and diastolic (congestive) heart failure: Secondary | ICD-10-CM | POA: Diagnosis not present

## 2023-07-08 DIAGNOSIS — I10 Essential (primary) hypertension: Secondary | ICD-10-CM | POA: Diagnosis not present

## 2023-07-08 DIAGNOSIS — Z0000 Encounter for general adult medical examination without abnormal findings: Secondary | ICD-10-CM | POA: Diagnosis not present

## 2023-07-08 DIAGNOSIS — I82509 Chronic embolism and thrombosis of unspecified deep veins of unspecified lower extremity: Secondary | ICD-10-CM | POA: Diagnosis not present

## 2023-07-08 DIAGNOSIS — E7849 Other hyperlipidemia: Secondary | ICD-10-CM | POA: Diagnosis not present

## 2023-07-08 DIAGNOSIS — Z9484 Stem cells transplant status: Secondary | ICD-10-CM | POA: Diagnosis not present

## 2023-07-08 DIAGNOSIS — J449 Chronic obstructive pulmonary disease, unspecified: Secondary | ICD-10-CM | POA: Diagnosis not present

## 2023-07-15 ENCOUNTER — Encounter: Payer: Self-pay | Admitting: Hematology

## 2023-07-16 ENCOUNTER — Encounter (INDEPENDENT_AMBULATORY_CARE_PROVIDER_SITE_OTHER): Payer: Self-pay | Admitting: *Deleted

## 2023-07-23 ENCOUNTER — Encounter: Payer: Self-pay | Admitting: Hematology

## 2023-07-23 DIAGNOSIS — Z1231 Encounter for screening mammogram for malignant neoplasm of breast: Secondary | ICD-10-CM | POA: Diagnosis not present

## 2023-07-24 ENCOUNTER — Other Ambulatory Visit: Payer: Self-pay | Admitting: Hematology

## 2023-07-24 DIAGNOSIS — C9001 Multiple myeloma in remission: Secondary | ICD-10-CM

## 2023-07-30 ENCOUNTER — Encounter (HOSPITAL_COMMUNITY)
Admission: RE | Admit: 2023-07-30 | Discharge: 2023-07-30 | Disposition: A | Discharge status: Home / Self Care | Payer: Medicare HMO | Source: Ambulatory Visit | Attending: Hematology | Admitting: Hematology

## 2023-07-30 DIAGNOSIS — C9001 Multiple myeloma in remission: Secondary | ICD-10-CM | POA: Diagnosis not present

## 2023-07-30 LAB — GLUCOSE, CAPILLARY: Glucose-Capillary: 208 mg/dL — ABNORMAL HIGH (ref 70–99)

## 2023-07-30 MED ORDER — FLUDEOXYGLUCOSE F - 18 (FDG) INJECTION
11.0000 | Freq: Once | INTRAVENOUS | Status: AC | PRN
  Administered 2023-07-30: 11 via INTRAVENOUS

## 2023-08-12 DIAGNOSIS — J449 Chronic obstructive pulmonary disease, unspecified: Secondary | ICD-10-CM | POA: Diagnosis not present

## 2023-08-12 DIAGNOSIS — I700 Atherosclerosis of aorta: Secondary | ICD-10-CM | POA: Diagnosis not present

## 2023-08-12 DIAGNOSIS — N1831 Chronic kidney disease, stage 3a: Secondary | ICD-10-CM | POA: Diagnosis not present

## 2023-08-12 DIAGNOSIS — I872 Venous insufficiency (chronic) (peripheral): Secondary | ICD-10-CM | POA: Diagnosis not present

## 2023-08-12 DIAGNOSIS — I10 Essential (primary) hypertension: Secondary | ICD-10-CM | POA: Diagnosis not present

## 2023-08-12 DIAGNOSIS — E7849 Other hyperlipidemia: Secondary | ICD-10-CM | POA: Diagnosis not present

## 2023-08-12 DIAGNOSIS — Z6833 Body mass index (BMI) 33.0-33.9, adult: Secondary | ICD-10-CM | POA: Diagnosis not present

## 2023-08-12 DIAGNOSIS — Z0000 Encounter for general adult medical examination without abnormal findings: Secondary | ICD-10-CM | POA: Diagnosis not present

## 2023-08-12 DIAGNOSIS — J301 Allergic rhinitis due to pollen: Secondary | ICD-10-CM | POA: Diagnosis not present

## 2023-08-12 DIAGNOSIS — I82509 Chronic embolism and thrombosis of unspecified deep veins of unspecified lower extremity: Secondary | ICD-10-CM | POA: Diagnosis not present

## 2023-08-12 DIAGNOSIS — Z9484 Stem cells transplant status: Secondary | ICD-10-CM | POA: Diagnosis not present

## 2023-08-12 DIAGNOSIS — E1122 Type 2 diabetes mellitus with diabetic chronic kidney disease: Secondary | ICD-10-CM | POA: Diagnosis not present

## 2023-08-21 ENCOUNTER — Other Ambulatory Visit: Payer: Self-pay | Admitting: Hematology

## 2023-08-21 DIAGNOSIS — C9001 Multiple myeloma in remission: Secondary | ICD-10-CM

## 2023-08-22 ENCOUNTER — Encounter: Payer: Self-pay | Admitting: Hematology

## 2023-08-26 ENCOUNTER — Other Ambulatory Visit: Payer: Self-pay

## 2023-08-26 DIAGNOSIS — C9001 Multiple myeloma in remission: Secondary | ICD-10-CM

## 2023-08-27 ENCOUNTER — Inpatient Hospital Stay

## 2023-08-27 ENCOUNTER — Inpatient Hospital Stay (HOSPITAL_BASED_OUTPATIENT_CLINIC_OR_DEPARTMENT_OTHER): Payer: Medicare HMO | Admitting: Hematology

## 2023-08-27 ENCOUNTER — Inpatient Hospital Stay: Payer: Medicare HMO | Attending: Hematology

## 2023-08-27 ENCOUNTER — Inpatient Hospital Stay: Payer: Medicare HMO

## 2023-08-27 VITALS — BP 192/64 | HR 99 | Temp 97.6°F | Resp 17 | Wt 216.4 lb

## 2023-08-27 DIAGNOSIS — C9001 Multiple myeloma in remission: Secondary | ICD-10-CM | POA: Insufficient documentation

## 2023-08-27 DIAGNOSIS — Z79899 Other long term (current) drug therapy: Secondary | ICD-10-CM | POA: Diagnosis not present

## 2023-08-27 DIAGNOSIS — C9002 Multiple myeloma in relapse: Secondary | ICD-10-CM

## 2023-08-27 DIAGNOSIS — Z95828 Presence of other vascular implants and grafts: Secondary | ICD-10-CM

## 2023-08-27 LAB — CMP (CANCER CENTER ONLY)
ALT: 21 U/L (ref 0–44)
AST: 22 U/L (ref 15–41)
Albumin: 3.9 g/dL (ref 3.5–5.0)
Alkaline Phosphatase: 83 U/L (ref 38–126)
Anion gap: 6 (ref 5–15)
BUN: 7 mg/dL — ABNORMAL LOW (ref 8–23)
CO2: 25 mmol/L (ref 22–32)
Calcium: 8.9 mg/dL (ref 8.9–10.3)
Chloride: 109 mmol/L (ref 98–111)
Creatinine: 1.24 mg/dL — ABNORMAL HIGH (ref 0.44–1.00)
GFR, Estimated: 48 mL/min — ABNORMAL LOW (ref >60)
Glucose, Bld: 211 mg/dL — ABNORMAL HIGH (ref 70–99)
Potassium: 3.6 mmol/L (ref 3.5–5.1)
Sodium: 140 mmol/L (ref 135–145)
Total Bilirubin: 0.5 mg/dL (ref 0.0–1.2)
Total Protein: 7 g/dL (ref 6.5–8.1)

## 2023-08-27 LAB — CBC WITH DIFFERENTIAL (CANCER CENTER ONLY)
Abs Immature Granulocytes: 0.01 10*3/uL (ref 0.00–0.07)
Basophils Absolute: 0.1 10*3/uL (ref 0.0–0.1)
Basophils Relative: 1 %
Eosinophils Absolute: 0.4 10*3/uL (ref 0.0–0.5)
Eosinophils Relative: 8 %
HCT: 33.8 % — ABNORMAL LOW (ref 36.0–46.0)
Hemoglobin: 11.4 g/dL — ABNORMAL LOW (ref 12.0–15.0)
Immature Granulocytes: 0 %
Lymphocytes Relative: 36 %
Lymphs Abs: 1.7 10*3/uL (ref 0.7–4.0)
MCH: 31.8 pg (ref 26.0–34.0)
MCHC: 33.7 g/dL (ref 30.0–36.0)
MCV: 94.4 fL (ref 80.0–100.0)
Monocytes Absolute: 0.4 10*3/uL (ref 0.1–1.0)
Monocytes Relative: 9 %
Neutro Abs: 2.2 10*3/uL (ref 1.7–7.7)
Neutrophils Relative %: 46 %
Platelet Count: 238 10*3/uL (ref 150–400)
RBC: 3.58 MIL/uL — ABNORMAL LOW (ref 3.87–5.11)
RDW: 14.2 % (ref 11.5–15.5)
WBC Count: 4.8 10*3/uL (ref 4.0–10.5)
nRBC: 0 % (ref 0.0–0.2)

## 2023-08-27 MED ORDER — SODIUM CHLORIDE 0.9% FLUSH
10.0000 mL | Freq: Once | INTRAVENOUS | Status: AC
  Administered 2023-08-27: 10 mL

## 2023-08-27 MED ORDER — ZOLEDRONIC ACID 4 MG/100ML IV SOLN
4.0000 mg | Freq: Once | INTRAVENOUS | Status: AC
  Administered 2023-08-27: 4 mg via INTRAVENOUS

## 2023-08-27 NOTE — Progress Notes (Signed)
 HEMATOLOGY/ONCOLOGY CLINIC VISIT NOTE  Date of Service: 08/27/23    Patient Care Team: Toma Deiters, MD as PCP - General (Internal Medicine) Glendale Chard, DO as Consulting Physician (Neurology)  CHIEF COMPLAINTS/PURPOSE OF CONSULTATION:   Follow-up for continued evaluation and management of multiple myeloma  Oncologic History:  68 y.o. female who initially presented with significant weight loss in the context of H. pylori-associated gastritis and alcohol abuse. At the same time, an incidental discovery of a lower thoracic vertebral mass in the context of chronic numbness and weakness in the lower extremities. MRI of the thoracic spine demonstrated a destructive lesion at T8 level and patient underwent surgical treatment. 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 Staging evaluation conducted by our clinic confirmed presence of IgG kappa multiple myeloma. Cytogenetics studies demonstrated normal karyotype, FISH studies are positive for 13q34 & D13S319 loss and no loss of p53. Based on initial information, patient was staged at R-ISS II.    Patient has completed 6 cycles of induction systemic chemotherapy with lenalidomide, bortezomib, and low-dose dexamethasone.  Repeat bone marrow biopsy demonstrated excellent response to treatment and patient subsequently underwent consolidation therapy including melphalan conditioning and autologous stem cell rescue.  Repeat assessment with PET/CT and bone marrow biopsy on 10/13/2017 demonstrates stringent complete response maintenance.  HISTORY OF PRESENTING ILLNESS:  Please see previous note for details on initial presentation  INTERVAL HISTORY:   Christine Garrett is a 68 y.o. female here for continued evaluation and management of her multiple myeloma.  Patient was last seen by me on 06/04/2023 and she complained of nasal congestion in the morning, clots of mucus, lack of appetite due to diarrhea, abdominal  bloating, and mild nausea.  Patient notes she has been doing well overall since our last visit. She notes she tolerated her radiation treatment well without any new or severe toxicities. She denies difficulty breathing or pain near nose. She does report mild difficulty smelling.   She denies any new infection issues, fever, chills, night sweats, unexpected weight loss, back pain, chest pain, abdominal pain. She does complain of mild bilateral leg swelling, worsens in the evening/night. She has been using compression socks.   Patient has been tolerating her current Revlimid 10 mg well without any new or severe toxicities.  Patient has recently started taking Metformin 1000 mg daily to control her diabetes.   She is currently in the process of scheduling her Colonoscopy.    MEDICAL HISTORY:  Past Medical History:  Diagnosis Date   Arthritis    knees   Cancer (HCC)    Carpal tunnel syndrome    CHF (congestive heart failure) (HCC)    Diabetes mellitus    type 2   GERD (gastroesophageal reflux disease)    Heart murmur    "Little, No concerns" per Dr  Andee Lineman pt reported.   Hypertension    controlled using a guided approch with plasma renin activity   Multiple myeloma not having achieved remission (HCC) 01/06/2017   Neuropathy    Peripheral vascular disease (HCC)    Sleep apnea    01/03/2020: per patient, hasn't used CPAP machine in two years due to inability to breathe well with it on    SURGICAL HISTORY: Past Surgical History:  Procedure Laterality Date   BIOPSY  11/28/2016   Procedure: BIOPSY;  Surgeon: Malissa Hippo, MD;  [facility];  Service: Endoscopy;;  gastric   BIOPSY  07/02/2019   Procedure: BIOPSY;  Surgeon:  Malissa Hippo, MD;  [facility];  Service: Endoscopy;;   BTL     1982   CATARACT EXTRACTION W/PHACO Left 03/10/2023   Procedure: CATARACT EXTRACTION PHACO AND INTRAOCULAR LENS PLACEMENT (IOC);  Surgeon: Fabio Pierce, MD;  Location: [facility];  Service: Ophthalmology;  Laterality: Left;  CDE: 9.43   CERVICAL CONE BIOPSY     cervical lesion   COLONOSCOPY WITH PROPOFOL N/A 07/02/2019   Procedure: COLONOSCOPY WITH PROPOFOL;  Surgeon: Malissa Hippo, MD;  [facility]: [facility];  Service: Endoscopy;  Laterality: N/A;  12:40 - pt can't come earlier due to transportation   ESOPHAGOGASTRODUODENOSCOPY (EGD) WITH PROPOFOL N/A 11/28/2016   Procedure: ESOPHAGOGASTRODUODENOSCOPY (EGD) WITH PROPOFOL;  Surgeon: Malissa Hippo, MD;  [facility];  Service: Endoscopy;  Laterality: N/A;  9:25   IR FLUORO GUIDE PORT INSERTION RIGHT  01/29/2017   IR US GUIDE VASC ACCESS RIGHT  01/29/2017   lipoma removal     right shoulder 2001   LUMBAR FUSION  01/05/2020    Lumbar Two-Three Lumbar Three-Four Lumbar Four-Five Posterior lumbar interbody fusion with decompression (N/A Spine Lumbar)   MULTIPLE EXTRACTIONS WITH ALVEOLOPLASTY Bilateral 08/24/2012   Procedure: MULTIPLE EXTRACTION WITH ALVEOLOPLASTY BIOPSY OF RIGHT AND LEFT MANDIBLE ;  Surgeon: Georgia Lopes, DDS;  Location: [facility];  Service: Oral Surgery;  Laterality: Bilateral;   POLYPECTOMY  07/02/2019   Procedure: POLYPECTOMY;  Surgeon: Malissa Hippo, MD;  [facility];  Service: Endoscopy;;   POSTERIOR LUMBAR FUSION 4 LEVEL N/A 12/26/2016   Procedure: THORACIC EIGHT TUMOR RESECTION, THORACIC SIX- THORACIC TEN POSTERIOR SPINAL FUSION;  Surgeon: Coletta Memos, MD;  Location: [facility];  Service: Neurosurgery;  Laterality: N/A;  THORACIC 8 TUMOR RESECTION, THORACIC 6- THORACIC 10 POSTERIOR SPINAL FUSION   ROTATOR CUFF REPAIR     right shoulder   TUBAL LIGATION      SOCIAL HISTORY: Social History   Socioeconomic History   Marital status: Single    Spouse name: Not on file   Number of children: 3   Years of education: Not on file   Highest education level: Not on file  Occupational History   Occupation: on disability   Occupation: former CNA  Tobacco Use   Smoking  status: Former    Current packs/day: 0.00    Average packs/day: 0.5 packs/day for 6.0 years (3.0 ttl pk-yrs)    Types: Cigarettes    Start date: 2013    Quit date: 2019    Years since quitting: 6.1   Smokeless tobacco: Never  Vaping Use   Vaping status: Never Used  Substance and Sexual Activity   Alcohol use: Yes    Alcohol/week: 6.0 standard drinks of alcohol    Types: 6 Cans of beer per week   Drug use: No   Sexual activity: Not Currently    Birth control/protection: Post-menopausal  Other Topics Concern   Not on file  Social History Narrative   Lives with son, daughter and grandkids in a 2 story home but stays on the first floor.  Has 3 children.  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      Right handed   Social Drivers of Health   Financial Resource Strain: Not on file  Food Insecurity: Food Insecurity Present (04/15/2023)   Hunger Vital Sign    Worried About Running Out of Food in the Last Year: Never true    Ran  Out of Food in the Last Year: Sometimes true  Transportation Needs: No Transportation Needs (04/15/2023)   PRAPARE - Administrator, Civil Service (Medical): No    Lack of Transportation (Non-Medical): No  Physical Activity: Not on file  Stress: Not on file  Social Connections: Unknown (05/21/2022)   [facility]   Social Network    Social Network: Not on file  Intimate Partner Violence: Not At Risk (04/15/2023)   Humiliation, Afraid, Rape, and Kick questionnaire    Fear of Current or Ex-Partner: No    Emotionally Abused: No    Physically Abused: No    Sexually Abused: No    FAMILY HISTORY: Family History  Problem Relation Age of Onset   Diabetes Mother    Hypertension Mother    Heart disease Mother    Alzheimer's disease Mother    Diabetes Father    Heart disease Sister    Diabetes Sister     ALLERGIES:  is allergic to hydrocodone.  MEDICATIONS:  Current Outpatient  Medications  Medication Sig Dispense Refill   amLODipine (NORVASC) 5 MG tablet Take 5 mg by mouth daily.     apixaban (ELIQUIS) 5 MG TABS tablet Take 1 tablet (5 mg total) by mouth 2 (two) times daily.     atorvastatin (LIPITOR) 40 MG tablet Take 40 mg by mouth daily. (Patient not taking: Reported on 04/15/2023)     enalapril (VASOTEC) 20 MG tablet Take 20 mg by mouth 2 (two) times daily.      gabapentin (NEURONTIN) 300 MG capsule TAKE 2 CAPSULES (600 MG TOTAL) BY MOUTH 3 (THREE) TIMES DAILY. (Patient not taking: Reported on 04/15/2023) 540 capsule 0   hydrALAZINE (APRESOLINE) 25 MG tablet Take 25 mg by mouth 2 (two) times daily.     insulin degludec (TRESIBA FLEXTOUCH) 100 UNIT/ML SOPN FlexTouch Pen Inject 0.5 mLs (50 Units total) into the skin daily at 10 pm. (Patient taking differently: Inject 40 Units into the skin daily at 10 pm.) 5 pen 2   Insulin Pen Needle (B-D ULTRAFINE III SHORT PEN) 31G X 8 MM MISC 1 each by Does not apply route as directed. 100 each 3   lenalidomide (REVLIMID) 10 MG capsule TAKE 1 CAPSULE BY MOUTH 1 TIME A DAY FOR 21 DAYS ON THEN 7 DAYS OFF 21 capsule 0   lipase/protease/amylase (CREON) 36000 UNITS CPEP capsule Take 1 capsule (36,000 Units total) by mouth 3 (three) times daily with meals. 180 capsule 2   metFORMIN (GLUCOPHAGE) 1000 MG tablet Take 1,000 mg by mouth 2 (two) times daily.      metoprolol succinate (TOPROL-XL) 50 MG 24 hr tablet Take 50 mg by mouth daily. Take with or immediately following a meal.     Omega-3 Fatty Acids (FISH OIL) 1000 MG CPDR Take 1,000 mg by mouth daily. Omega 3 300 mg     ondansetron (ZOFRAN) 8 MG tablet Take 1 tablet (8 mg total) by mouth every 8 (eight) hours as needed for nausea or vomiting. 30 tablet 0   potassium chloride (MICRO-K) 10 MEQ CR capsule Take 10 mEq by mouth daily. (Patient not taking: Reported on 04/15/2023)     Vitamin D, Ergocalciferol, (DRISDOL) 50000 units CAPS capsule Take 50,000 Units by mouth every Monday.       No current facility-administered medications for this visit.    REVIEW OF SYSTEMS:    10 Point review of Systems was done is negative except as noted above.  PHYSICAL EXAMINATION:  .BP (!) 192/64 Comment: 2nd BP check  Pulse 99   Temp 97.6 F (36.4 C) (Temporal)   Resp 17   Wt 216 lb 6.4 oz (98.2 kg)   SpO2 100%   BMI 31.96 kg/m    GENERAL:alert, in no acute distress and comfortable SKIN: no acute rashes, no significant lesions EYES: conjunctiva are pink and non-injected, sclera anicteric OROPHARYNX: MMM, no exudates, no oropharyngeal erythema or ulceration NECK: supple, no JVD LYMPH:  no palpable lymphadenopathy in the cervical, axillary or inguinal regions LUNGS: clear to auscultation b/l with normal respiratory effort HEART: regular rate & rhythm ABDOMEN:  normoactive bowel sounds , non tender, not distended. Extremity: no pedal edema PSYCH: alert & oriented x 3 with fluent speech NEURO: no focal motor/sensory deficits    LABORATORY DATA:  I have reviewed the data as listed      Latest Ref Rng & Units 08/27/2023    8:22 AM 06/04/2023   10:06 AM 04/02/2023    9:54 AM  CBC  WBC 4.0 - 10.5 K/uL 4.8  4.9  5.8   Hemoglobin 12.0 - 15.0 g/dL 84.6  96.2  95.2   Hematocrit 36.0 - 46.0 % 33.8  33.4  36.9   Platelets 150 - 400 K/uL 238  182  269     .    Latest Ref Rng & Units 08/27/2023    8:22 AM 06/04/2023   10:06 AM 04/02/2023    9:54 AM  CMP  Glucose 70 - 99 mg/dL 841  324  401   BUN 8 - 23 mg/dL 7  6  8    Creatinine 0.44 - 1.00 mg/dL 0.27  2.53  6.64   Sodium 135 - 145 mmol/L 140  142  140   Potassium 3.5 - 5.1 mmol/L 3.6  4.0  3.7   Chloride 98 - 111 mmol/L 109  111  108   CO2 22 - 32 mmol/L 25  24  21    Calcium 8.9 - 10.3 mg/dL 8.9  9.4  9.3   Total Protein 6.5 - 8.1 g/dL 7.0  6.8  7.5   Total Bilirubin 0.0 - 1.2 mg/dL 0.5  0.7  0.6   Alkaline Phos 38 - 126 U/L 83  80  62   AST 15 - 41 U/L 22  32  27   ALT 0 - 44 U/L 21  33  25    Bone marrow  biopsy 02/18/2023:    03/25/18 SPEP and SFLC:     04/21/17 Cytogenetics:    01/17/17 Cytogenetics:    RADIOGRAPHIC STUDIES: I have personally reviewed the radiological images as listed and agreed with the findings in the report.  NM PET Image Restage (PS) Whole Body Result Date: 08/15/2023 CLINICAL DATA:  Subsequent treatment strategy for multiple myeloma/in remission. EXAM: NUCLEAR MEDICINE PET WHOLE BODY TECHNIQUE: 10.9 mCi F-18 FDG was injected intravenously. Full-ring PET imaging was performed from the head to foot after the radiotracer. CT data was obtained and used for attenuation correction and anatomic localization. Fasting blood glucose: 208 mg/dl COMPARISON:  40/34/7425 FINDINGS: Mediastinal blood pool activity: SUV max 4.0 HEAD/NECK: The hypermetabolism involving the anteromedial wall of the right maxillary sinus measures a S.U.V. max of 5.2 today versus a S.U.V. max of 16.2 on the prior. Less distinct on CT at 7 mm today versus 11 mm on the prior (42/4 today). No cervical nodal hypermetabolism. Incidental CT findings: No cervical adenopathy. Right maxillary sinus mucosal thickening and fluid. Ethmoid  air cell mucosal thickening. Bilateral carotid atherosclerosis. CHEST: No pulmonary parenchymal or thoracic nodal hypermetabolism. Incidental CT findings: Right Port-A-Cath tip high right atrium. Mild cardiomegaly. Aortic and coronary artery calcification. Mild centrilobular emphysema. ABDOMEN/PELVIS: No abdominopelvic parenchymal or nodal hypermetabolism. Incidental CT findings: Left adrenal thickening and mild nodularity are unchanged. Moderate hepatic steatosis. Dependent gallstones. Retroverted uterus. Pelvic floor laxity. SKELETON: No abnormal marrow activity. Incidental CT findings: Right greater than left hip osteoarthritis. Thoracic and lumbar spine fixation. EXTREMITIES: No abnormal marrow activity. Incidental CT findings: none IMPRESSION: 1. Decreased size and hypermetabolism of an  isolated anterior right maxillary sinus wall lesion, favoring response to therapy of myeloma. 2. No new or progressive disease. 3. Incidental findings, including: Coronary artery atherosclerosis. Aortic Atherosclerosis (ICD10-I70.0). Hepatic steatosis. Cholelithiasis. Sinus disease. Emphysema (ICD10-J43.9). Electronically Signed   By: Jeronimo Greaves M.D.   On: 08/15/2023 15:28    10/13/17 PET/CT     ASSESSMENT & PLAN:   68 y.o. female with  1. Multiple myeloma Currently in remission Autologous PBSCT on 07/01/17. IgG kappa multiple myeloma. Cytogenetics studies demonstrated normal karyotype, FISH studies are positive for 13q34 & D13S319 loss and no loss of p53. Based on initial information, patient was staged at R-ISS II.  Completed 6 cycles of induction with Revlimid, Velcade and dexamethasone  12/09/2018 lumbar spine xray with results revealing "No acute osseous abnormality. Extensive multilevel degenerative changes are noted throughout the lumbar spine."  12/09/2018 sacrum and coccyx xray with results revealing "No acute osseous abnormality. Degenerative changes are noted of both hips."  2. Grade 2 neuropathy -controlled  02/26/18 NCV with EMG which revealed The electrophysiologic findings are most consistent with a chronic, length-dependent sensorimotor axonal and demyelinating polyneuropathy affecting the right side. Overall, these findings are moderate to severe in degree electrically. Incidentally, there is a right Martin-Gruber anastomosis, a normal variant.   3. Rt LE calf veins - possible clot ?chronic.  01/29/18 VAS [place] BLE which revealed Right: Ultrasound is unable to distinguish whether obstruction in the Peroneal veins, and great saphenous vein, and superficial veins/varciosities is acute or chronic. No cystic structure found in the popliteal fossa. Left: no evidence of DVT.   PLAN: -Discussed lab results from today, 08/27/2023, in detail with the patient. CBC is stable with  slightly low Hgb of 11.4 g/dL with Hct of 16.1%.  CMP stable Myeloma panel shows stable M spike of 0.2g/dl SFLC shows normal K/L ratio -Discussed PET scan results from 07/30/2023 in detail with the patient. Showed 1. Decreased size and hypermetabolism of an isolated anterior right maxillary sinus wall lesion, favoring response to therapy of myeloma. 2. No new or progressive disease. 3. Incidental findings, including: Coronary artery atherosclerosis. -Patient has been tolerating her current dose of Revlimid well without any new or severe toxicities.  -continue maintenance Revlimid treatment at 10 MG -continue Zometa every 12 weeks -Answered all of patient's questions.   FOLLOW-UP: RTC with Dr Candise Che with labs with next dose of Zometa in 12 weeks  The total time spent in the appointment was 20 minutes* .  All of the patient's questions were answered with apparent satisfaction. The patient knows to call the clinic with any problems, questions or concerns.   Wyvonnia Lora MD MS AAHIVMS [facility] Hematology/Oncology Physician [facility]  .*Total Encounter Time as defined by the Centers for Medicare and Medicaid Services includes, in addition to the face-to-face time of a patient visit (documented in the note above) non-face-to-face time: obtaining and reviewing outside history, ordering  and reviewing medications, tests or procedures, care coordination (communications with other health care professionals or caregivers) and documentation in the medical record.  I,Param Shah,acting as a Neurosurgeon for Wyvonnia Lora, MD.,have documented all relevant documentation on the behalf of Wyvonnia Lora, MD,as directed by  Wyvonnia Lora, MD while in the presence of Wyvonnia Lora, MD.  .I have reviewed the above documentation for accuracy and completeness, and I agree with the above. [name] MD

## 2023-08-27 NOTE — Progress Notes (Signed)
 Patient seen by Dr. Addison Naegeli are within treatment parameters.  Labs reviewed: and are within treatment parameters.  Per physician team, patient is ready for treatment and there are NO modifications to the treatment plan.

## 2023-08-28 LAB — KAPPA/LAMBDA LIGHT CHAINS
Kappa free light chain: 61.5 mg/L — ABNORMAL HIGH (ref 3.3–19.4)
Kappa, lambda light chain ratio: 1.61 (ref 0.26–1.65)
Lambda free light chains: 38.2 mg/L — ABNORMAL HIGH (ref 5.7–26.3)

## 2023-08-31 ENCOUNTER — Encounter: Payer: Self-pay | Admitting: Hematology

## 2023-08-31 LAB — MULTIPLE MYELOMA PANEL, SERUM
Albumin SerPl Elph-Mcnc: 3.4 g/dL (ref 2.9–4.4)
Albumin/Glob SerPl: 1.1 (ref 0.7–1.7)
Alpha 1: 0.2 g/dL (ref 0.0–0.4)
Alpha2 Glob SerPl Elph-Mcnc: 0.7 g/dL (ref 0.4–1.0)
B-Globulin SerPl Elph-Mcnc: 0.9 g/dL (ref 0.7–1.3)
Gamma Glob SerPl Elph-Mcnc: 1.2 g/dL (ref 0.4–1.8)
Globulin, Total: 3.2 g/dL (ref 2.2–3.9)
IgA: 284 mg/dL (ref 87–352)
IgG (Immunoglobin G), Serum: 1210 mg/dL (ref 586–1602)
IgM (Immunoglobulin M), Srm: 35 mg/dL (ref 26–217)
M Protein SerPl Elph-Mcnc: 0.2 g/dL — ABNORMAL HIGH
Total Protein ELP: 6.6 g/dL (ref 6.0–8.5)

## 2023-09-18 ENCOUNTER — Other Ambulatory Visit: Payer: Self-pay | Admitting: Hematology

## 2023-09-18 DIAGNOSIS — C9001 Multiple myeloma in remission: Secondary | ICD-10-CM

## 2023-09-19 ENCOUNTER — Encounter: Payer: Self-pay | Admitting: Hematology

## 2023-10-14 DIAGNOSIS — E1122 Type 2 diabetes mellitus with diabetic chronic kidney disease: Secondary | ICD-10-CM | POA: Diagnosis not present

## 2023-10-14 DIAGNOSIS — I509 Heart failure, unspecified: Secondary | ICD-10-CM | POA: Diagnosis not present

## 2023-10-14 DIAGNOSIS — I719 Aortic aneurysm of unspecified site, without rupture: Secondary | ICD-10-CM | POA: Diagnosis not present

## 2023-10-14 DIAGNOSIS — D6869 Other thrombophilia: Secondary | ICD-10-CM | POA: Diagnosis not present

## 2023-10-14 DIAGNOSIS — Z008 Encounter for other general examination: Secondary | ICD-10-CM | POA: Diagnosis not present

## 2023-10-14 DIAGNOSIS — E1142 Type 2 diabetes mellitus with diabetic polyneuropathy: Secondary | ICD-10-CM | POA: Diagnosis not present

## 2023-10-14 DIAGNOSIS — I700 Atherosclerosis of aorta: Secondary | ICD-10-CM | POA: Diagnosis not present

## 2023-10-14 DIAGNOSIS — E1151 Type 2 diabetes mellitus with diabetic peripheral angiopathy without gangrene: Secondary | ICD-10-CM | POA: Diagnosis not present

## 2023-10-14 DIAGNOSIS — I130 Hypertensive heart and chronic kidney disease with heart failure and stage 1 through stage 4 chronic kidney disease, or unspecified chronic kidney disease: Secondary | ICD-10-CM | POA: Diagnosis not present

## 2023-10-14 DIAGNOSIS — I4891 Unspecified atrial fibrillation: Secondary | ICD-10-CM | POA: Diagnosis not present

## 2023-10-14 DIAGNOSIS — N1831 Chronic kidney disease, stage 3a: Secondary | ICD-10-CM | POA: Diagnosis not present

## 2023-10-14 DIAGNOSIS — I82509 Chronic embolism and thrombosis of unspecified deep veins of unspecified lower extremity: Secondary | ICD-10-CM | POA: Diagnosis not present

## 2023-10-14 DIAGNOSIS — J449 Chronic obstructive pulmonary disease, unspecified: Secondary | ICD-10-CM | POA: Diagnosis not present

## 2023-10-16 ENCOUNTER — Other Ambulatory Visit: Payer: Self-pay | Admitting: Hematology

## 2023-10-16 DIAGNOSIS — C9001 Multiple myeloma in remission: Secondary | ICD-10-CM

## 2023-10-29 DIAGNOSIS — Z981 Arthrodesis status: Secondary | ICD-10-CM | POA: Diagnosis not present

## 2023-10-29 DIAGNOSIS — J9811 Atelectasis: Secondary | ICD-10-CM | POA: Diagnosis not present

## 2023-10-29 DIAGNOSIS — R42 Dizziness and giddiness: Secondary | ICD-10-CM | POA: Diagnosis not present

## 2023-10-29 DIAGNOSIS — K8020 Calculus of gallbladder without cholecystitis without obstruction: Secondary | ICD-10-CM | POA: Diagnosis not present

## 2023-10-29 DIAGNOSIS — R112 Nausea with vomiting, unspecified: Secondary | ICD-10-CM | POA: Diagnosis not present

## 2023-10-29 DIAGNOSIS — K429 Umbilical hernia without obstruction or gangrene: Secondary | ICD-10-CM | POA: Diagnosis not present

## 2023-10-29 DIAGNOSIS — R079 Chest pain, unspecified: Secondary | ICD-10-CM | POA: Diagnosis not present

## 2023-10-29 DIAGNOSIS — Z452 Encounter for adjustment and management of vascular access device: Secondary | ICD-10-CM | POA: Diagnosis not present

## 2023-10-30 DIAGNOSIS — Z7984 Long term (current) use of oral hypoglycemic drugs: Secondary | ICD-10-CM | POA: Diagnosis not present

## 2023-10-30 DIAGNOSIS — I5022 Chronic systolic (congestive) heart failure: Secondary | ICD-10-CM | POA: Diagnosis not present

## 2023-10-30 DIAGNOSIS — J9811 Atelectasis: Secondary | ICD-10-CM | POA: Diagnosis not present

## 2023-10-30 DIAGNOSIS — I2489 Other forms of acute ischemic heart disease: Secondary | ICD-10-CM | POA: Diagnosis not present

## 2023-10-30 DIAGNOSIS — I130 Hypertensive heart and chronic kidney disease with heart failure and stage 1 through stage 4 chronic kidney disease, or unspecified chronic kidney disease: Secondary | ICD-10-CM | POA: Diagnosis not present

## 2023-10-30 DIAGNOSIS — K8020 Calculus of gallbladder without cholecystitis without obstruction: Secondary | ICD-10-CM | POA: Diagnosis not present

## 2023-10-30 DIAGNOSIS — Z9221 Personal history of antineoplastic chemotherapy: Secondary | ICD-10-CM | POA: Diagnosis not present

## 2023-10-30 DIAGNOSIS — R079 Chest pain, unspecified: Secondary | ICD-10-CM | POA: Diagnosis not present

## 2023-10-30 DIAGNOSIS — Z87891 Personal history of nicotine dependence: Secondary | ICD-10-CM | POA: Diagnosis not present

## 2023-10-30 DIAGNOSIS — Z794 Long term (current) use of insulin: Secondary | ICD-10-CM | POA: Diagnosis not present

## 2023-10-30 DIAGNOSIS — Z885 Allergy status to narcotic agent status: Secondary | ICD-10-CM | POA: Diagnosis not present

## 2023-10-30 DIAGNOSIS — I2510 Atherosclerotic heart disease of native coronary artery without angina pectoris: Secondary | ICD-10-CM | POA: Diagnosis not present

## 2023-10-30 DIAGNOSIS — R7989 Other specified abnormal findings of blood chemistry: Secondary | ICD-10-CM | POA: Diagnosis not present

## 2023-10-30 DIAGNOSIS — R112 Nausea with vomiting, unspecified: Secondary | ICD-10-CM | POA: Diagnosis not present

## 2023-10-30 DIAGNOSIS — I951 Orthostatic hypotension: Secondary | ICD-10-CM | POA: Diagnosis not present

## 2023-10-30 DIAGNOSIS — K429 Umbilical hernia without obstruction or gangrene: Secondary | ICD-10-CM | POA: Diagnosis not present

## 2023-10-30 DIAGNOSIS — G4733 Obstructive sleep apnea (adult) (pediatric): Secondary | ICD-10-CM | POA: Diagnosis not present

## 2023-10-30 DIAGNOSIS — Z452 Encounter for adjustment and management of vascular access device: Secondary | ICD-10-CM | POA: Diagnosis not present

## 2023-10-30 DIAGNOSIS — Z981 Arthrodesis status: Secondary | ICD-10-CM | POA: Diagnosis not present

## 2023-10-30 DIAGNOSIS — I5020 Unspecified systolic (congestive) heart failure: Secondary | ICD-10-CM | POA: Diagnosis not present

## 2023-10-30 DIAGNOSIS — Z7901 Long term (current) use of anticoagulants: Secondary | ICD-10-CM | POA: Diagnosis not present

## 2023-10-30 DIAGNOSIS — I110 Hypertensive heart disease with heart failure: Secondary | ICD-10-CM | POA: Diagnosis not present

## 2023-10-30 DIAGNOSIS — E1122 Type 2 diabetes mellitus with diabetic chronic kidney disease: Secondary | ICD-10-CM | POA: Diagnosis not present

## 2023-10-30 DIAGNOSIS — D492 Neoplasm of unspecified behavior of bone, soft tissue, and skin: Secondary | ICD-10-CM | POA: Diagnosis not present

## 2023-10-30 DIAGNOSIS — Z79899 Other long term (current) drug therapy: Secondary | ICD-10-CM | POA: Diagnosis not present

## 2023-10-30 DIAGNOSIS — Z8679 Personal history of other diseases of the circulatory system: Secondary | ICD-10-CM | POA: Diagnosis not present

## 2023-10-30 DIAGNOSIS — E876 Hypokalemia: Secondary | ICD-10-CM | POA: Diagnosis not present

## 2023-10-30 DIAGNOSIS — Z856 Personal history of leukemia: Secondary | ICD-10-CM | POA: Diagnosis not present

## 2023-10-30 DIAGNOSIS — E785 Hyperlipidemia, unspecified: Secondary | ICD-10-CM | POA: Diagnosis not present

## 2023-10-30 DIAGNOSIS — Z86711 Personal history of pulmonary embolism: Secondary | ICD-10-CM | POA: Diagnosis not present

## 2023-10-30 DIAGNOSIS — R42 Dizziness and giddiness: Secondary | ICD-10-CM | POA: Diagnosis not present

## 2023-10-30 DIAGNOSIS — E860 Dehydration: Secondary | ICD-10-CM | POA: Diagnosis not present

## 2023-10-30 DIAGNOSIS — E1151 Type 2 diabetes mellitus with diabetic peripheral angiopathy without gangrene: Secondary | ICD-10-CM | POA: Diagnosis not present

## 2023-10-30 DIAGNOSIS — N189 Chronic kidney disease, unspecified: Secondary | ICD-10-CM | POA: Diagnosis not present

## 2023-10-30 DIAGNOSIS — I10 Essential (primary) hypertension: Secondary | ICD-10-CM | POA: Diagnosis not present

## 2023-11-02 DIAGNOSIS — I428 Other cardiomyopathies: Secondary | ICD-10-CM | POA: Diagnosis not present

## 2023-11-02 DIAGNOSIS — E119 Type 2 diabetes mellitus without complications: Secondary | ICD-10-CM | POA: Diagnosis not present

## 2023-11-02 DIAGNOSIS — Z7901 Long term (current) use of anticoagulants: Secondary | ICD-10-CM | POA: Diagnosis not present

## 2023-11-02 DIAGNOSIS — Z9221 Personal history of antineoplastic chemotherapy: Secondary | ICD-10-CM | POA: Diagnosis not present

## 2023-11-02 DIAGNOSIS — G4733 Obstructive sleep apnea (adult) (pediatric): Secondary | ICD-10-CM | POA: Diagnosis not present

## 2023-11-02 DIAGNOSIS — R42 Dizziness and giddiness: Secondary | ICD-10-CM | POA: Diagnosis not present

## 2023-11-02 DIAGNOSIS — Z87891 Personal history of nicotine dependence: Secondary | ICD-10-CM | POA: Diagnosis not present

## 2023-11-02 DIAGNOSIS — R55 Syncope and collapse: Secondary | ICD-10-CM | POA: Diagnosis not present

## 2023-11-02 DIAGNOSIS — Z794 Long term (current) use of insulin: Secondary | ICD-10-CM | POA: Diagnosis not present

## 2023-11-02 DIAGNOSIS — Z8579 Personal history of other malignant neoplasms of lymphoid, hematopoietic and related tissues: Secondary | ICD-10-CM | POA: Diagnosis not present

## 2023-11-02 DIAGNOSIS — E1122 Type 2 diabetes mellitus with diabetic chronic kidney disease: Secondary | ICD-10-CM | POA: Diagnosis not present

## 2023-11-02 DIAGNOSIS — E785 Hyperlipidemia, unspecified: Secondary | ICD-10-CM | POA: Diagnosis not present

## 2023-11-02 DIAGNOSIS — R079 Chest pain, unspecified: Secondary | ICD-10-CM | POA: Diagnosis not present

## 2023-11-02 DIAGNOSIS — I10 Essential (primary) hypertension: Secondary | ICD-10-CM | POA: Diagnosis not present

## 2023-11-02 DIAGNOSIS — R7989 Other specified abnormal findings of blood chemistry: Secondary | ICD-10-CM | POA: Diagnosis not present

## 2023-11-02 DIAGNOSIS — Z86711 Personal history of pulmonary embolism: Secondary | ICD-10-CM | POA: Diagnosis not present

## 2023-11-02 DIAGNOSIS — I5022 Chronic systolic (congestive) heart failure: Secondary | ICD-10-CM | POA: Diagnosis not present

## 2023-11-02 DIAGNOSIS — I2510 Atherosclerotic heart disease of native coronary artery without angina pectoris: Secondary | ICD-10-CM | POA: Diagnosis not present

## 2023-11-02 DIAGNOSIS — I25118 Atherosclerotic heart disease of native coronary artery with other forms of angina pectoris: Secondary | ICD-10-CM | POA: Diagnosis not present

## 2023-11-02 DIAGNOSIS — I130 Hypertensive heart and chronic kidney disease with heart failure and stage 1 through stage 4 chronic kidney disease, or unspecified chronic kidney disease: Secondary | ICD-10-CM | POA: Diagnosis not present

## 2023-11-02 DIAGNOSIS — I214 Non-ST elevation (NSTEMI) myocardial infarction: Secondary | ICD-10-CM | POA: Diagnosis not present

## 2023-11-02 DIAGNOSIS — Z86718 Personal history of other venous thrombosis and embolism: Secondary | ICD-10-CM | POA: Diagnosis not present

## 2023-11-02 DIAGNOSIS — I5020 Unspecified systolic (congestive) heart failure: Secondary | ICD-10-CM | POA: Diagnosis not present

## 2023-11-02 DIAGNOSIS — I951 Orthostatic hypotension: Secondary | ICD-10-CM | POA: Diagnosis not present

## 2023-11-02 DIAGNOSIS — I110 Hypertensive heart disease with heart failure: Secondary | ICD-10-CM | POA: Diagnosis not present

## 2023-11-02 DIAGNOSIS — M7989 Other specified soft tissue disorders: Secondary | ICD-10-CM | POA: Diagnosis not present

## 2023-11-02 DIAGNOSIS — N189 Chronic kidney disease, unspecified: Secondary | ICD-10-CM | POA: Diagnosis not present

## 2023-11-05 DIAGNOSIS — I25118 Atherosclerotic heart disease of native coronary artery with other forms of angina pectoris: Secondary | ICD-10-CM | POA: Diagnosis not present

## 2023-11-05 NOTE — Progress Notes (Signed)
 Returned call to pt regarding taking her Revlimid . Per Dr Salomon Cree pt to hold Revlimid  since she was just in the hospital until we see her in the office. Pt acknowledged information and verbalized understanding.

## 2023-11-05 NOTE — Transitions of Care (Post Inpatient/ED Visit) (Signed)
 11/05/2023  Patient ID: Christine Garrett, female   DOB: 14-Nov-1955, 68 y.o.   MRN: [id number]  Medication Review for transitions of care.  See [employer] for  documentation.  Delano Scardino J. [name] RN, MSN [facility], [facility] RN Care Manager Direct Dial: [phone]  Fax: [phone] Website: [email or web address]

## 2023-11-13 ENCOUNTER — Telehealth: Payer: Self-pay

## 2023-11-13 ENCOUNTER — Other Ambulatory Visit: Payer: Self-pay | Admitting: Hematology

## 2023-11-13 DIAGNOSIS — C9001 Multiple myeloma in remission: Secondary | ICD-10-CM

## 2023-11-13 NOTE — Transitions of Care (Post Inpatient/ED Visit) (Signed)
 11/13/2023  Patient ID: Christine Garrett, female   DOB: 02-09-1956, 68 y.o.   MRN: [id number]  Medication Review for transitions of care.  See [employer] for  documentation.  Margaurite Salido J. Niva Murren RN, MSN [facility], [facility] RN Care Manager Direct Dial: [phone]  Fax: [phone] Website: [email or web address]

## 2023-11-17 ENCOUNTER — Encounter: Payer: Self-pay | Admitting: Hematology

## 2023-11-18 ENCOUNTER — Telehealth: Payer: Self-pay | Admitting: Hematology

## 2023-11-18 DIAGNOSIS — R42 Dizziness and giddiness: Secondary | ICD-10-CM | POA: Diagnosis not present

## 2023-11-18 DIAGNOSIS — I951 Orthostatic hypotension: Secondary | ICD-10-CM | POA: Diagnosis not present

## 2023-11-18 DIAGNOSIS — I5020 Unspecified systolic (congestive) heart failure: Secondary | ICD-10-CM | POA: Diagnosis not present

## 2023-11-18 NOTE — Telephone Encounter (Signed)
 Was unable to leave the patient a voicemail. Spoke to the patient daughter and confirmed appointments with her. She stated she would make the patient aware of the details.

## 2023-11-19 ENCOUNTER — Inpatient Hospital Stay

## 2023-11-19 ENCOUNTER — Inpatient Hospital Stay: Admitting: Hematology

## 2023-11-19 ENCOUNTER — Inpatient Hospital Stay: Payer: Medicare HMO

## 2023-11-19 DIAGNOSIS — I700 Atherosclerosis of aorta: Secondary | ICD-10-CM | POA: Diagnosis not present

## 2023-11-19 DIAGNOSIS — I82509 Chronic embolism and thrombosis of unspecified deep veins of unspecified lower extremity: Secondary | ICD-10-CM | POA: Diagnosis not present

## 2023-11-19 DIAGNOSIS — I214 Non-ST elevation (NSTEMI) myocardial infarction: Secondary | ICD-10-CM | POA: Diagnosis not present

## 2023-11-19 DIAGNOSIS — I10 Essential (primary) hypertension: Secondary | ICD-10-CM | POA: Diagnosis not present

## 2023-11-19 DIAGNOSIS — E7849 Other hyperlipidemia: Secondary | ICD-10-CM | POA: Diagnosis not present

## 2023-11-19 DIAGNOSIS — Z9484 Stem cells transplant status: Secondary | ICD-10-CM | POA: Diagnosis not present

## 2023-11-19 DIAGNOSIS — J449 Chronic obstructive pulmonary disease, unspecified: Secondary | ICD-10-CM | POA: Diagnosis not present

## 2023-11-19 DIAGNOSIS — E1122 Type 2 diabetes mellitus with diabetic chronic kidney disease: Secondary | ICD-10-CM | POA: Diagnosis not present

## 2023-11-19 DIAGNOSIS — Z6831 Body mass index (BMI) 31.0-31.9, adult: Secondary | ICD-10-CM | POA: Diagnosis not present

## 2023-11-19 DIAGNOSIS — I872 Venous insufficiency (chronic) (peripheral): Secondary | ICD-10-CM | POA: Diagnosis not present

## 2023-11-19 DIAGNOSIS — I5042 Chronic combined systolic (congestive) and diastolic (congestive) heart failure: Secondary | ICD-10-CM | POA: Diagnosis not present

## 2023-11-19 DIAGNOSIS — N1831 Chronic kidney disease, stage 3a: Secondary | ICD-10-CM | POA: Diagnosis not present

## 2023-11-20 ENCOUNTER — Telehealth: Payer: Self-pay

## 2023-11-24 ENCOUNTER — Telehealth: Payer: Self-pay

## 2023-11-24 NOTE — Transitions of Care (Post Inpatient/ED Visit) (Signed)
 11/24/2023  Patient ID: Christine Garrett, female   DOB: 1955/10/10, 68 y.o.   MRN: [id number]   Medication Review for transitions of care.  See [employer] for documentation.  [name] J. Earma Nicolaou RN, MSN [facility], [facility] RN Care Manager Direct Dial: [phone]  Fax: [phone] Website: [email or web address]

## 2023-11-27 ENCOUNTER — Telehealth

## 2023-11-27 ENCOUNTER — Other Ambulatory Visit: Payer: Self-pay | Admitting: Physician Assistant

## 2023-11-27 ENCOUNTER — Other Ambulatory Visit: Payer: Self-pay

## 2023-11-27 DIAGNOSIS — C9001 Multiple myeloma in remission: Secondary | ICD-10-CM

## 2023-11-28 ENCOUNTER — Inpatient Hospital Stay: Attending: Hematology | Admitting: Physician Assistant

## 2023-11-28 ENCOUNTER — Other Ambulatory Visit: Payer: Self-pay

## 2023-11-28 ENCOUNTER — Inpatient Hospital Stay

## 2023-11-28 VITALS — BP 141/63 | HR 92 | Temp 97.7°F | Resp 18 | Wt 218.3 lb

## 2023-11-28 DIAGNOSIS — C9001 Multiple myeloma in remission: Secondary | ICD-10-CM

## 2023-11-28 DIAGNOSIS — Z79899 Other long term (current) drug therapy: Secondary | ICD-10-CM | POA: Diagnosis not present

## 2023-11-28 DIAGNOSIS — C9000 Multiple myeloma not having achieved remission: Secondary | ICD-10-CM | POA: Insufficient documentation

## 2023-11-28 DIAGNOSIS — Z95828 Presence of other vascular implants and grafts: Secondary | ICD-10-CM

## 2023-11-28 DIAGNOSIS — D539 Nutritional anemia, unspecified: Secondary | ICD-10-CM | POA: Diagnosis not present

## 2023-11-28 LAB — IRON AND IRON BINDING CAPACITY (CC-WL,HP ONLY)
Iron: 50 ug/dL (ref 28–170)
Saturation Ratios: 14 % (ref 10.4–31.8)
TIBC: 365 ug/dL (ref 250–450)
UIBC: 315 ug/dL (ref 148–442)

## 2023-11-28 LAB — RETIC PANEL
Immature Retic Fract: 30.3 % — ABNORMAL HIGH (ref 2.3–15.9)
RBC.: 2.55 MIL/uL — ABNORMAL LOW (ref 3.87–5.11)
Retic Count, Absolute: 98.4 10*3/uL (ref 19.0–186.0)
Retic Ct Pct: 3.9 % — ABNORMAL HIGH (ref 0.4–3.1)
Reticulocyte Hemoglobin: 31.6 pg (ref >27.9)

## 2023-11-28 LAB — CBC WITH DIFFERENTIAL (CANCER CENTER ONLY)
Abs Immature Granulocytes: 0.01 10*3/uL (ref 0.00–0.07)
Basophils Absolute: 0 10*3/uL (ref 0.0–0.1)
Basophils Relative: 1 %
Eosinophils Absolute: 0.1 10*3/uL (ref 0.0–0.5)
Eosinophils Relative: 2 %
HCT: 25.4 % — ABNORMAL LOW (ref 36.0–46.0)
Hemoglobin: 8.2 g/dL — ABNORMAL LOW (ref 12.0–15.0)
Immature Granulocytes: 0 %
Lymphocytes Relative: 35 %
Lymphs Abs: 1.8 10*3/uL (ref 0.7–4.0)
MCH: 31.3 pg (ref 26.0–34.0)
MCHC: 32.3 g/dL (ref 30.0–36.0)
MCV: 96.9 fL (ref 80.0–100.0)
Monocytes Absolute: 0.4 10*3/uL (ref 0.1–1.0)
Monocytes Relative: 7 %
Neutro Abs: 2.9 10*3/uL (ref 1.7–7.7)
Neutrophils Relative %: 55 %
Platelet Count: 268 10*3/uL (ref 150–400)
RBC: 2.62 MIL/uL — ABNORMAL LOW (ref 3.87–5.11)
RDW: 14.9 % (ref 11.5–15.5)
WBC Count: 5.2 10*3/uL (ref 4.0–10.5)
nRBC: 0 % (ref 0.0–0.2)

## 2023-11-28 LAB — CMP (CANCER CENTER ONLY)
ALT: 39 U/L (ref 0–44)
AST: 33 U/L (ref 15–41)
Albumin: 4 g/dL (ref 3.5–5.0)
Alkaline Phosphatase: 91 U/L (ref 38–126)
Anion gap: 6 (ref 5–15)
BUN: 8 mg/dL (ref 8–23)
CO2: 22 mmol/L (ref 22–32)
Calcium: 9.1 mg/dL (ref 8.9–10.3)
Chloride: 113 mmol/L — ABNORMAL HIGH (ref 98–111)
Creatinine: 1.23 mg/dL — ABNORMAL HIGH (ref 0.44–1.00)
GFR, Estimated: 49 mL/min — ABNORMAL LOW (ref >60)
Glucose, Bld: 136 mg/dL — ABNORMAL HIGH (ref 70–99)
Potassium: 4.3 mmol/L (ref 3.5–5.1)
Sodium: 141 mmol/L (ref 135–145)
Total Bilirubin: 0.5 mg/dL (ref 0.0–1.2)
Total Protein: 7.1 g/dL (ref 6.5–8.1)

## 2023-11-28 LAB — FOLATE: Folate: 5.7 ng/mL — ABNORMAL LOW (ref >5.9)

## 2023-11-28 LAB — VITAMIN B12: Vitamin B-12: 276 pg/mL (ref 180–914)

## 2023-11-28 LAB — FERRITIN: Ferritin: 39 ng/mL (ref 11–307)

## 2023-11-28 MED ORDER — HEPARIN SOD (PORK) LOCK FLUSH 100 UNIT/ML IV SOLN
500.0000 [IU] | Freq: Once | INTRAVENOUS | Status: AC
  Administered 2023-11-28: 500 [IU]

## 2023-11-28 MED ORDER — SODIUM CHLORIDE 0.9% FLUSH
10.0000 mL | Freq: Once | INTRAVENOUS | Status: AC
  Administered 2023-11-28: 10 mL

## 2023-11-28 MED ORDER — ZOLEDRONIC ACID 4 MG/100ML IV SOLN
4.0000 mg | Freq: Once | INTRAVENOUS | Status: AC
  Administered 2023-11-28: 4 mg via INTRAVENOUS
  Filled 2023-11-28: qty 100

## 2023-12-01 ENCOUNTER — Encounter: Payer: Self-pay | Admitting: Hematology

## 2023-12-01 LAB — KAPPA/LAMBDA LIGHT CHAINS
Kappa free light chain: 51.1 mg/L — ABNORMAL HIGH (ref 3.3–19.4)
Kappa, lambda light chain ratio: 2.18 — ABNORMAL HIGH (ref 0.26–1.65)
Lambda free light chains: 23.4 mg/L (ref 5.7–26.3)

## 2023-12-01 LAB — MULTIPLE MYELOMA PANEL, SERUM
Albumin SerPl Elph-Mcnc: 3.3 g/dL (ref 2.9–4.4)
Albumin/Glob SerPl: 1.1 (ref 0.7–1.7)
Alpha 1: 0.2 g/dL (ref 0.0–0.4)
Alpha2 Glob SerPl Elph-Mcnc: 0.7 g/dL (ref 0.4–1.0)
B-Globulin SerPl Elph-Mcnc: 1 g/dL (ref 0.7–1.3)
Gamma Glob SerPl Elph-Mcnc: 1.2 g/dL (ref 0.4–1.8)
Globulin, Total: 3.2 g/dL (ref 2.2–3.9)
IgA: 232 mg/dL (ref 87–352)
IgG (Immunoglobin G), Serum: 1148 mg/dL (ref 586–1602)
IgM (Immunoglobulin M), Srm: 38 mg/dL (ref 26–217)
M Protein SerPl Elph-Mcnc: 0.3 g/dL — ABNORMAL HIGH
Total Protein ELP: 6.5 g/dL (ref 6.0–8.5)

## 2023-12-01 MED ORDER — FOLIC ACID 1 MG PO TABS: 1.0000 mg | ORAL_TABLET | Freq: Every day | ORAL | 2 refills | Status: AC

## 2023-12-01 MED ORDER — VITAMIN B-12 1000 MCG PO TABS: 1000.0000 ug | ORAL_TABLET | Freq: Every day | ORAL | 3 refills | Status: AC

## 2023-12-01 MED ORDER — FERROUS SULFATE 325 (65 FE) MG PO TBEC: 325.0000 mg | DELAYED_RELEASE_TABLET | Freq: Every day | ORAL | 3 refills | Status: AC

## 2023-12-01 NOTE — Progress Notes (Signed)
 HEMATOLOGY/ONCOLOGY CLINIC VISIT NOTE  Date of Service: 11/28/23   Patient Care Team: Veda Gerald, MD as PCP - General (Internal Medicine) Patel, Donika K, DO as Consulting Physician (Neurology) Leath, Dionne, RN as VBC[facility] Care Management  CHIEF COMPLAINTS/PURPOSE OF CONSULTATION:   Follow-up for continued evaluation and management of multiple myeloma   INTERVAL HISTORY:   Christine Garrett is a 68 y.o. female here for continued evaluation and management of her multiple myeloma. She was last seen on 08/27/2023 by Dr. Salomon Cree. In the interim, she was admitted in [facility] for presenting with chest pain and found to have RCA stenosis requiring stent.   Ms. Syme reports she is slowly recovering from her hospitalization. She is feeling more unsteady than her baseline and requires a walker to help with ambulation. She reports her energy levels are slowly improving. She denies nausea, vomiting or bowel habit changes. Her appetite and weight are overall stable. She denies easy bruising or signs of bleeding. She denies fevers, chills, sweats, shortness of breath, chest pain or cough. She has no other complaints.   MEDICAL HISTORY:  Past Medical History:  Diagnosis Date   Arthritis    knees   Cancer (HCC)    Carpal tunnel syndrome    CHF (congestive heart failure) (HCC)    Diabetes mellitus    type 2   GERD (gastroesophageal reflux disease)    Heart murmur    Little, No concerns per Dr  Valaria Garland pt reported.   Hypertension    controlled using a guided approch with plasma renin activity   Multiple myeloma not having achieved remission (HCC) 01/06/2017   Neuropathy    Peripheral vascular disease (HCC)    Sleep apnea    01/03/2020: per patient, hasn't used CPAP machine in two years due to inability to breathe well with it on    SURGICAL HISTORY: Past Surgical History:  Procedure Laterality Date   BIOPSY  11/28/2016   Procedure: BIOPSY;  Surgeon: Ruby Corporal, MD;  Location: [facility]  END[facility];  Service: Endoscopy;;  gastric   BIOPSY  07/02/2019   Procedure: BIOPSY;  Surgeon: Ruby Corporal, MD;  [facility]: [facility];  Service: Endoscopy;;   BTL     1982   CATARACT EXTRACTION W/PHACO Left 03/10/2023   Procedure: CATARACT EXTRACTION PHACO AND INTRAOCULAR LENS PLACEMENT (IOC);  Surgeon: Tarri Farm, MD;  Location: [facility] ORS;  Service: Ophthalmology;  Laterality: Left;  CDE: 9.43   CERVICAL CONE BIOPSY     cervical lesion   COLONOSCOPY WITH PROPOFOL  N/A 07/02/2019   Procedure: COLONOSCOPY WITH PROPOFOL ;  Surgeon: Ruby Corporal, MD;  Location: [facility];  Service: Endoscopy;  Laterality: N/A;  12:40 - pt can't come earlier due to transportation   ESOPHAGOGASTRODUODENOSCOPY (EGD) WITH PROPOFOL  N/A 11/28/2016   Procedure: ESOPHAGOGASTRODUODENOSCOPY (EGD) WITH PROPOFOL ;  Surgeon: Ruby Corporal, MD;  [facility];  Service: Endoscopy;  Laterality: N/A;  9:25   IR FLUORO GUIDE PORT INSERTION RIGHT  01/29/2017   IR US  GUIDE VASC ACCESS RIGHT  01/29/2017   lipoma removal     right shoulder 2001   LUMBAR FUSION  01/05/2020    Lumbar Two-Three Lumbar Three-Four Lumbar Four-Five Posterior lumbar interbody fusion with decompression (N/A Spine Lumbar)   MULTIPLE EXTRACTIONS WITH ALVEOLOPLASTY Bilateral 08/24/2012   Procedure: MULTIPLE EXTRACTION WITH ALVEOLOPLASTY BIOPSY OF RIGHT AND LEFT MANDIBLE ;  Surgeon: Cornelia Dieter, DDS;  Location: [facility] OR;  Service: Oral Surgery;  Laterality:  Bilateral;   POLYPECTOMY  07/02/2019   Procedure: POLYPECTOMY;  Surgeon: Ruby Corporal, MD;  [facility];  Service: Endoscopy;;   POSTERIOR LUMBAR FUSION 4 LEVEL N/A 12/26/2016   Procedure: THORACIC EIGHT TUMOR RESECTION, THORACIC SIX- THORACIC TEN POSTERIOR SPINAL FUSION;  Surgeon: Audie Bleacher, MD;  Location: [facility] OR;  Service: Neurosurgery;  Laterality: N/A;  THORACIC 8 TUMOR RESECTION, THORACIC 6- THORACIC 10 POSTERIOR SPINAL FUSION   ROTATOR CUFF REPAIR     right  shoulder   TUBAL LIGATION      SOCIAL HISTORY: Social History   Socioeconomic History   Marital status: Single    Spouse name: Not on file   Number of children: 3   Years of education: Not on file   Highest education level: Not on file  Occupational History   Occupation: on disability   Occupation: former CNA  Tobacco Use   Smoking status: Former    Current packs/day: 0.00    Average packs/day: 0.5 packs/day for 6.0 years (3.0 ttl pk-yrs)    Types: Cigarettes    Start date: 2013    Quit date: 2019    Years since quitting: 6.4   Smokeless tobacco: Never  Vaping Use   Vaping status: Never Used  Substance and Sexual Activity   Alcohol use: Yes    Alcohol/week: 6.0 standard drinks of alcohol    Types: 6 Cans of beer per week   Drug use: No   Sexual activity: Not Currently    Birth control/protection: Post-menopausal  Other Topics Concern   Not on file  Social History Narrative   Lives with son, daughter and grandkids in a 2 story home but stays on the first floor.  Has 3 children.  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      Right handed   Social Drivers of Health   Financial Resource Strain: Low Risk  (10/30/2023)   Received [facility]   Overall Financial Resource Strain (CARDIA)    Difficulty of Paying Living Expenses: Not hard at all  Food Insecurity: No Food Insecurity (10/31/2023)   Received from [facility]   Hunger Vital Sign    Within the past 12 months, you worried that your food would run out before you got the money to buy more.: Never true    Within the past 12 months, the food you bought just didn't last and you didn't have money to get more.: Never true  Transportation Needs: No Transportation Needs (10/31/2023)   Received from [facility]    Lack of Transportation (Medical): No    Lack of Transportation (Non-Medical): No  Physical Activity: Inactive (10/31/2023)   Received from [facility]   Exercise Vital Sign    On average, how many days per week do you engage in moderate to strenuous exercise (like a brisk walk)?: 0 days    On average, how many minutes do you engage in exercise at this level?: 0 min  Stress: Not on file  Social Connections: Socially Isolated (10/31/2023)   Received from [facility]   Social Connection and Isolation Panel    In a typical week, how many times do you talk on the phone with family, friends, or neighbors?: Three times a week    How often do you get together with friends or relatives?: Once a week    How often do you attend  church or religious services?: Never    Do you belong to any clubs or organizations such as church groups, unions, fraternal or athletic groups, or school groups?: No    How often do you attend meetings of the clubs or organizations you belong to?: Never    Are you married, widowed, divorced, separated, never married, or living with a partner?: Widowed  Intimate Partner Violence: Not At Risk (10/30/2023)   Received from [facility]   Humiliation, Afraid, Rape, and Kick questionnaire    Within the last year, have you been afraid of your partner or ex-partner?: No    Within the last year, have you been humiliated or emotionally abused in other ways by your partner or ex-partner?: No    Within the last year, have you been kicked, hit, slapped, or otherwise physically hurt by your partner or ex-partner?: No    Within the last year, have you been raped or forced to have any kind of sexual activity by your partner or ex-partner?: No    FAMILY HISTORY: Family History  Problem Relation Age of Onset   Diabetes Mother    Hypertension Mother    Heart disease Mother    Alzheimer's disease Mother    Diabetes Father    Heart disease Sister    Diabetes Sister     ALLERGIES:  is allergic to hydrocodone.  MEDICATIONS:  Current Outpatient Medications  Medication Sig Dispense Refill   amLODipine  (NORVASC ) 5 MG  tablet Take 5 mg by mouth daily.     apixaban  (ELIQUIS ) 5 MG TABS tablet Take 1 tablet (5 mg total) by mouth 2 (two) times daily.     atorvastatin  (LIPITOR) 40 MG tablet Take 40 mg by mouth daily.     clopidogrel (PLAVIX) 75 MG tablet Take 75 mg by mouth.     empagliflozin (JARDIANCE) 10 MG TABS tablet Take 1 tablet by mouth daily.     ezetimibe (ZETIA) 10 MG tablet Take 1 tablet by mouth daily.     hydrALAZINE  (APRESOLINE ) 25 MG tablet Take 25 mg by mouth 2 (two) times daily.     insulin  degludec (TRESIBA  FLEXTOUCH) 100 UNIT/ML SOPN FlexTouch Pen Inject 0.5 mLs (50 Units total) into the skin daily at 10 pm. (Patient taking differently: Inject 40 Units into the skin daily at 10 pm.) 5 pen 2   Insulin  Pen Needle (B-D ULTRAFINE III SHORT PEN) 31G X 8 MM MISC 1 each by Does not apply route as directed. 100 each 3   lenalidomide  (REVLIMID ) 10 MG capsule Take 1 capsule (10 mg total) by mouth daily. Take 1 capsule (10 mg total)  by mouth daily for 21 days. Take 7 days off and then repeat. 21 capsule 0   metFORMIN  (GLUCOPHAGE ) 1000 MG tablet Take 1,000 mg by mouth 2 (two) times daily.      metoprolol  succinate (TOPROL -XL) 50 MG 24 hr tablet Take 50 mg by mouth daily. Take with or immediately following a meal.     Omega-3 Fatty Acids (FISH OIL) 1000 MG CPDR Take 1,000 mg by mouth daily. Omega 3 300 mg     ondansetron  (ZOFRAN ) 8 MG tablet Take 1 tablet (8 mg total) by mouth every 8 (eight) hours as needed for nausea or vomiting. 30 tablet 0   potassium chloride  (MICRO-K ) 10 MEQ CR capsule Take 10 mEq by mouth daily.     Vitamin D , Ergocalciferol , (DRISDOL ) 50000 units CAPS capsule Take 50,000 Units by mouth every Monday.  enalapril  (VASOTEC ) 20 MG tablet Take 20 mg by mouth 2 (two) times daily.  (Patient not taking: Reported on 11/28/2023)     gabapentin  (NEURONTIN ) 300 MG capsule TAKE 2 CAPSULES (600 MG TOTAL) BY MOUTH 3 (THREE) TIMES DAILY. (Patient not taking: Reported on 11/28/2023) 540 capsule 0    lipase/protease/amylase (CREON ) 36000 UNITS CPEP capsule Take 1 capsule (36,000 Units total) by mouth 3 (three) times daily with meals. (Patient not taking: Reported on 11/28/2023) 180 capsule 2   No current facility-administered medications for this visit.    REVIEW OF SYSTEMS:    10 Point review of Systems was done is negative except as noted above.   PHYSICAL EXAMINATION:  .BP (!) 141/63 (BP Location: Left Arm, Patient Position: Sitting) Comment: Nurse notified  Pulse 92   Temp 97.7 F (36.5 C) (Temporal)   Resp 18   Wt 218 lb 4.8 oz (99 kg)   SpO2 100%   BMI 32.24 kg/m    GENERAL:alert, in no acute distress and comfortable SKIN: no acute rashes, no significant lesions EYES: conjunctiva are pink and non-injected, sclera anicteric LUNGS: clear to auscultation b/l with normal respiratory effort HEART: regular rate & rhythm Extremity: no pedal edema PSYCH: alert & oriented x 3 with fluent speech NEURO: no focal motor/sensory deficits    LABORATORY DATA:  I have reviewed the data as listed      Latest Ref Rng & Units 11/28/2023   10:22 AM 08/27/2023    8:22 AM 06/04/2023   10:06 AM  CBC  WBC 4.0 - 10.5 K/uL 5.2  4.8  4.9   Hemoglobin 12.0 - 15.0 g/dL 8.2  40.9  81.1   Hematocrit 36.0 - 46.0 % 25.4  33.8  33.4   Platelets 150 - 400 K/uL 268  238  182     .    Latest Ref Rng & Units 11/28/2023   10:22 AM 08/27/2023    8:22 AM 06/04/2023   10:06 AM  CMP  Glucose 70 - 99 mg/dL 914  782  956   BUN 8 - 23 mg/dL 8  7  6    Creatinine 0.44 - 1.00 mg/dL 2.13  0.86  5.78   Sodium 135 - 145 mmol/L 141  140  142   Potassium 3.5 - 5.1 mmol/L 4.3  3.6  4.0   Chloride 98 - 111 mmol/L 113  109  111   CO2 22 - 32 mmol/L 22  25  24    Calcium  8.9 - 10.3 mg/dL 9.1  8.9  9.4   Total Protein 6.5 - 8.1 g/dL 7.1  7.0  6.8   Total Bilirubin 0.0 - 1.2 mg/dL 0.5  0.5  0.7   Alkaline Phos 38 - 126 U/L 91  83  80   AST 15 - 41 U/L 33  22  32   ALT 0 - 44 U/L 39  21  33    Bone marrow  biopsy 02/18/2023:    03/25/18 SPEP and SFLC:     04/21/17 Cytogenetics:    01/17/17 Cytogenetics:    RADIOGRAPHIC STUDIES: I have personally reviewed the radiological images as listed and agreed with the findings in the report.  No results found.   10/13/17 PET/CT     ASSESSMENT & PLAN:  Christine Garrett is a 68 y.o. female who presents for a follow up for continued management of multiple myeloma.     1. Multiple myeloma -IgG kappa multiple myeloma. Cytogenetics studies demonstrated normal karyotype, FISH studies  are positive for 13q34 & D13S319 loss and no loss of p53. Based on initial information, patient was staged at R-ISS II.  -Completed 6 cycles of induction with Revlimid , Velcade  and dexamethasone  --Underwent Autologous PBSCT on 07/01/17.  PLAN: -Recently hospitalized last month at [facility] for chest pain and found to have 70% stenosis of RCA requiring drug eluting stent on 11/03/2023. -Holding Revlimid  since hospitalization last month.  -Labs from today were reviewed. WBC 5.2, Hgb 8.2 (baseline 11-12), Plt 268, creatinine stable at 1.23, LFTs normal. Myeloma labs pending -Due to worsening anemia, recommend to continue to hold Revlimid  and check iron/b12/folate levels -continue Zometa  today   FOLLOW-UP: --4 weeks: labs and follow up --12 weeks: Labs, follow up and next dose of Zometa    All of the patient's questions were answered with apparent satisfaction. The patient knows to call the clinic with any problems, questions or concerns.   I have spent a total of 30 minutes minutes of face-to-face and non-face-to-face time, preparing to see the patient, performing a medically appropriate examination, counseling and educating the patient, ordering medications/tests/procedures, documenting clinical information in the electronic health record, independently interpreting results and communicating results to the patient, and care coordination.   Wyline Hearing PA-C Dept of  Hematology and Oncology [facility] Phone: [phone]

## 2023-12-02 ENCOUNTER — Ambulatory Visit: Payer: Self-pay

## 2023-12-02 ENCOUNTER — Telehealth: Payer: Self-pay

## 2023-12-02 ENCOUNTER — Telehealth: Payer: Self-pay | Admitting: Hematology

## 2023-12-02 NOTE — Telephone Encounter (Signed)
 Pt advised of lab results and new prescriptions sent to her pharmacy.  Pt with VU

## 2023-12-02 NOTE — Transitions of Care (Post Inpatient/ED Visit) (Signed)
 12/02/2023  Patient ID: Christine Garrett, female   DOB: 1955-12-13, 68 y.o.   MRN: [id number]   Medication Review for transitions of care.  See [employer] for  documentation.

## 2023-12-02 NOTE — Progress Notes (Signed)
 This encounter was created in error - please disregard.

## 2023-12-02 NOTE — Telephone Encounter (Signed)
 Scheduled appointments per 6/13 los. Talked with the patient and she is aware of the made appointments.

## 2023-12-02 NOTE — Telephone Encounter (Signed)
-----   Message from Darilyn Edin sent at 12/01/2023 10:23 PM EDT ----- Can you notify patient that labs show iron, b12 and folate are either deficient or low end of normal. I sent supplements to her pharmacy on file. ----- Message ----- From: Dannis Dy, Lab In [place] Sent: 11/28/2023  12:12 PM EDT To: Darilyn Edin, PA-C

## 2023-12-09 ENCOUNTER — Other Ambulatory Visit: Payer: Self-pay

## 2023-12-09 DIAGNOSIS — I10 Essential (primary) hypertension: Secondary | ICD-10-CM

## 2023-12-09 NOTE — Transitions of Care (Post Inpatient/ED Visit) (Signed)
 12/09/2023  Patient ID: Christine Garrett, female   DOB: 05/16/1956, 68 y.o.   MRN: [id number]  Medication Review for transitions of care.  See [employer] for  documentation.  Patient has completed 30 day transition of care program.  Patient agreeable to CCM.  Referral complete.  Katlin Bortner J. Cecillia Menees RN, MSN [facility], [facility] RN Care Manager Direct Dial: [phone]  Fax: [phone] Website: [email or web address]

## 2023-12-10 ENCOUNTER — Telehealth: Payer: Self-pay

## 2023-12-10 NOTE — Progress Notes (Unsigned)
 Complex Care Management Note Care Guide Note  12/10/2023 Name: Christine Garrett MRN: [id number] DOB: [birth year]   Complex Care Management Outreach Attempts: An unsuccessful telephone outreach was attempted today to offer the patient information about available complex care management services.  Follow Up Plan:  Additional outreach attempts will be made to offer the patient complex care management information and services.   Encounter Outcome:  Patient Refused-Pt asked could she call back to schedule.  Leotis Rase [facility], [facility] Guide  Direct Dial: [phone]  Fax [phone]

## 2023-12-11 NOTE — Progress Notes (Signed)
 Complex Care Management Note  Care Guide Note 12/11/2023 Name: Christine Garrett MRN: [id number] DOB: [birth year]  Christine Garrett is a 68 y.o. year old female who sees Hasanaj, Yancey LABOR, MD for primary care. I reached out to Barnie Hush by phone today to offer complex care management services.  [name] was given information about Complex Care Management services today including:   The Complex Care Management services include support from the care team which includes your Nurse Care Manager, Clinical Social Worker, or Pharmacist.  The Complex Care Management team is here to help remove barriers to the health concerns and goals most important to you. Complex Care Management services are voluntary, and the patient may decline or stop services at any time by request to their care team member.   Complex Care Management Consent Status: Patient agreed to services and verbal consent obtained.   Follow up plan:  Telephone appointment with complex care management team member scheduled for:  12-22-23  Encounter Outcome:  Patient Scheduled  Leotis Rase [facility], [facility] Guide  Direct Dial: [phone]  Fax [phone]

## 2023-12-14 DIAGNOSIS — I25118 Atherosclerotic heart disease of native coronary artery with other forms of angina pectoris: Secondary | ICD-10-CM | POA: Diagnosis not present

## 2023-12-18 ENCOUNTER — Other Ambulatory Visit: Payer: Self-pay

## 2023-12-22 ENCOUNTER — Encounter: Payer: Self-pay | Admitting: *Deleted

## 2023-12-22 ENCOUNTER — Other Ambulatory Visit: Payer: Self-pay | Admitting: *Deleted

## 2023-12-22 VITALS — BP 137/70 | Wt 218.0 lb

## 2023-12-22 DIAGNOSIS — I2510 Atherosclerotic heart disease of native coronary artery without angina pectoris: Secondary | ICD-10-CM

## 2023-12-22 NOTE — Patient Outreach (Signed)
 Complex Care Management   Visit Note  12/22/2023  Name:  Christine Garrett MRN: [id number] DOB: 02-Sep-1955  Situation: Referral received for Complex Care Management related to Heart Failure and CAD. I obtained verbal consent from Patient.  Visit completed with Barnie Hush on the phone  Background:   Past Medical History:  Diagnosis Date   Arthritis    knees   Cancer (HCC)    Carpal tunnel syndrome    CHF (congestive heart failure) (HCC)    Diabetes mellitus    type 2   GERD (gastroesophageal reflux disease)    Heart murmur    Little, No concerns per Dr  Jodine pt reported.   Hypertension    controlled using a guided approch with plasma renin activity   Multiple myeloma not having achieved remission (HCC) 01/06/2017   Neuropathy    Peripheral vascular disease (HCC)    Sleep apnea    01/03/2020: per patient, hasn't used CPAP machine in two years due to inability to breathe well with it on    Assessment: Patient Reported Symptoms:  Cognitive Cognitive Status: No symptoms reported, Alert and oriented to person, place, and time Cognitive/Intellectual Conditions Management [RPT]: None reported or documented in medical history or problem list   Health Maintenance Behaviors: Annual physical exam Healing Pattern: Unsure Health Facilitated by: Rest  Neurological Neurological Review of Symptoms: No symptoms reported    HEENT HEENT Symptoms Reported: No symptoms reported      Cardiovascular Cardiovascular Symptoms Reported: No symptoms reported Does patient have uncontrolled Hypertension?: No Cardiovascular Management Strategies: Medication therapy, Routine screening Weight: 218 lb (98.9 kg) Cardiovascular Self-Management Outcome: 4 (good) Cardiovascular Comment: Cardiologist scheduled for 05/18/24 at 10:40 at [facility] in [place]  Respiratory Respiratory Symptoms Reported: No symptoms reported    Endocrine Endocrine Symptoms Reported: No symptoms reported Is patient  diabetic?: Yes Is patient checking blood sugars at home?: Yes List most recent blood sugar readings, include date and time of day: 124 last night before Lantus  administration Endocrine Self-Management Outcome: 4 (good) Endocrine Comment: Managed by PCP. Last visit in 07/2023. Next visit scheduled for 05/2024. No readings over 200 or less than 70.  Gastrointestinal Gastrointestinal Symptoms Reported: No symptoms reported      Genitourinary Genitourinary Symptoms Reported: No symptoms reported    Integumentary Integumentary Symptoms Reported: No symptoms reported    Musculoskeletal Musculoskelatal Symptoms Reviewed: Difficulty walking, Back pain Additional Musculoskeletal Details: chronic mid back pain. multiple Myeloma in spine. Told to take Tylenol  which dulls pain but doesnt eliminate it. Musculoskeletal Management Strategies: Medication therapy Musculoskeletal Comment: walker for ambulation Falls in the past year?: No Number of falls in past year: 1 or less Was there an injury with Fall?: No Fall Risk Category Calculator: 0 Patient Fall Risk Level: Low Fall Risk Patient at Risk for Falls Due to: Impaired mobility  Psychosocial Psychosocial Symptoms Reported: No symptoms reported Additional Psychological Details: Watches grandchildren, 8 yrs and 49 yrs old, during the summer. Keeps her on her toes. Behavioral Management Strategies: Activity Behavioral Health Self-Management Outcome: 4 (good) Major Change/Loss/Stressor/Fears (CP): Denies Quality of Family Relationships: supportive, helpful, involved Do you feel physically threatened by others?: No      04/15/2023   11:17 AM  Depression screen PHQ 2/9  Decreased Interest 0  Down, Depressed, Hopeless 0  PHQ - 2 Score 0    Vitals:   12/22/23 1057  BP: 137/70    Medications Reviewed Today     Reviewed by Charlsie Josette SAILOR, RN (  Registered Nurse) on 12/22/23 at 1154  Med List Status: <None>   Medication Order Taking? Sig  Documenting Provider Last Dose Status Informant  amLODipine  (NORVASC ) 5 MG tablet [id number] Yes Take 5 mg by mouth daily. [provider]  Active Self  apixaban  (ELIQUIS ) 5 MG TABS tablet [id number] Yes Take 1 tablet (5 mg total) by mouth 2 (two) times daily. Golda Claudis PENNER, MD  Active Self  atorvastatin  (LIPITOR) 40 MG tablet [id number] Yes Take 40 mg by mouth daily. [provider]  Active Self           Med Note LUCIAN, LAVAUN JINNY [place] Nov 05, 2023  2:53 PM) Taking 80mg  daily  clopidogrel (PLAVIX) 75 MG tablet [id number]  Take 75 mg by mouth.  Patient not taking: Reported on 12/22/2023   [provider]  Active   cyanocobalamin  (VITAMIN B12) 1000 MCG tablet [id number] Yes Take 1 tablet (1,000 mcg total) by mouth daily. Thayil, Irene T, PA-C  Active   empagliflozin (JARDIANCE) 10 MG TABS tablet [id number] Yes Take 1 tablet by mouth daily. [provider]  Active   ezetimibe (ZETIA) 10 MG tablet [id number] Yes Take 1 tablet by mouth daily. [provider]  Active   ferrous sulfate  325 (65 FE) MG EC tablet [id number] Yes Take 1 tablet (325 mg total) by mouth daily with breakfast. Neomi Lis T, PA-C  Active   folic acid  (FOLVITE ) 1 MG tablet [id number] Yes Take 1 tablet (1 mg total) by mouth daily. Thayil, Irene T, PA-C  Active   hydrALAZINE  (APRESOLINE ) 25 MG tablet [id number] Yes Take 25 mg by mouth 2 (two) times daily. [provider]  Active Self           Med Note LUCIAN, DIONNE J   Wed Nov 05, 2023  3:06 PM) Taking 50 mg TID  insulin  degludec (TRESIBA  FLEXTOUCH) 100 UNIT/ML SOPN FlexTouch Pen [id number] Yes Inject 0.5 mLs (50 Units total) into the skin daily at 10 pm. Nida, Gebreselassie W, MD  Active            Med Note LUCIAN, DIONNE J   Thu Nov 13, 2023 11:17 AM) Taking 50 units daily  Insulin  Pen Needle (B-D ULTRAFINE III SHORT PEN) 31G X 8 MM MISC [id number] Yes 1 each by Does not apply route as directed. Nida, Gebreselassie W, MD  Active  Self  lenalidomide  (REVLIMID ) 10 MG capsule [id number]  Take 1 capsule (10 mg total) by mouth daily. Take 1 capsule (10 mg total)  by mouth daily for 21 days. Take 7 days off and then repeat.  Patient not taking: Reported on 12/22/2023   Onesimo Emaline Brink, MD  Active   lipase/protease/amylase (CREON ) 36000 UNITS CPEP capsule [id number]  Take 1 capsule (36,000 Units total) by mouth 3 (three) times daily with meals.  Patient not taking: Reported on 12/22/2023   Onesimo Emaline Brink, MD  Active   metFORMIN  (GLUCOPHAGE ) 1000 MG tablet [id number] Yes Take 1,000 mg by mouth 2 (two) times daily.  [provider]  Active Self           Med Note LUCIAN, DIONNE J   Thu Nov 13, 2023 11:18 AM) Taking 1000 mg once daily  metoprolol  succinate (TOPROL -XL) 50 MG 24 hr tablet [id number] Yes Take 50 mg by mouth daily. Take with or immediately following a meal.  Patient taking differently: Take 25 mg by mouth daily. Take with or immediately following a meal.   [provider]  Active Self  Omega-3 Fatty Acids (FISH OIL) 1000 MG CPDR [id number] Yes Take 1,000 mg by mouth daily. Omega 3 300 mg [provider]  Active Self  ondansetron  (ZOFRAN ) 8 MG tablet [id number] Yes Take 1 tablet (8 mg total) by mouth every 8 (eight) hours as needed for nausea or vomiting. Onesimo Emaline Brink, MD  Active   potassium chloride  (MICRO-K ) 10 MEQ CR capsule [id number] Yes Take 10 mEq by mouth daily. [provider]  Active Self  Vitamin D , Ergocalciferol , (DRISDOL ) 50000 units CAPS capsule [id number] Yes Take 50,000 Units by mouth every Monday.  [provider]  Active Self  Zoledronic  Acid (ZOMETA  IV) [id number] Yes Inject 4 mg into the vein every 3 (three) months. Infusion [provider]  Active   Med List Note Carmella Asberry FALCON [facility] 02/26/23 1511): Revlimid  filled through [facility]            Recommendation:   Continue Current Plan of Care  Follow Up Plan:    Telephone follow up appointment date/time:  12/30/23 10:15  Josette Pellet, RN, BSN [place]  [facility] Population Health RN Care Manager Direct Dial: [phone]  Fax: [phone]

## 2023-12-22 NOTE — Patient Instructions (Addendum)
 Visit Information  Thank you for taking time to visit with me today. Please don't hesitate to contact me if I can be of assistance to you before our next scheduled appointment.  Our next appointment is by telephone on 12/30/23 at 10:15  Please call the care guide team at [phone] if you need to cancel or reschedule your appointment.   Following is a copy of your care plan:   Goals Addressed             This Visit's Progress    VBCI RN Care Plan: Cardiac Conditions   On track    Problems:  Chronic Disease Management support and education needs related to CHF and CAD  Goal: Over the next 6 months the Patient will not experience hospital admission as evidenced by review of electronic medical record. Hospital Admissions in last 6 months = 2 Over the next 90 days, patient will keep all medical appointments and will reach out to provider with any new or worsening symptoms Over the next 30 day, patient will take all medications as prescribed  Interventions:   CAD Interventions: Assessed understanding of CAD diagnosis Medications reviewed including medications utilized in CAD treatment plan Provided education on importance of blood pressure control in management of CAD Reviewed Importance of attending all scheduled provider appointments Assessed social determinant of health barriers Reviewed hospitalization notes from 10/2023  Heart Failure Interventions: Basic overview and discussion of pathophysiology of Heart Failure reviewed Provided education on low sodium diet Assessed need for readable accurate scales in home Advised patient to weigh each morning after emptying bladder Referral made to community resources care guide team for assistance with dental resources; Discussed signs/symptoms of heart failure Encouraged to monitor and record blood pressure daily and take log to provider visits for review Advised to call provider with any weight gain of more than 2 lbs overnight or  more than 5 lbs in one week   Patient Self-Care Activities:  Attend all scheduled provider appointments Call provider office for new concerns or questions  call office if I gain more than 2 pounds in one day or 5 pounds in one week use salt in moderation watch for swelling in feet, ankles and legs every day weigh myself daily bring diary to all appointments  Plan:  Telephone follow up appointment with care management team member scheduled for:  12/30/23             Please call the [facility]: [phone] call 911 if you are experiencing a Mental Health or Behavioral Health Crisis or need someone to talk to.  Patient verbalizes understanding of instructions and care plan provided today and agrees to view in MyChart. Active MyChart status and patient understanding of how to access instructions and care plan via MyChart confirmed with patient.     Josette Pellet, RN, BSN [place]  [facility] Health RN Care Manager Direct Dial: [phone]  Fax: [phone]

## 2023-12-24 ENCOUNTER — Telehealth: Payer: Self-pay

## 2023-12-24 NOTE — Progress Notes (Signed)
   Telephone encounter was:  Successful.  Complex Care Management Note Care Guide Note  12/24/2023 Name: Christine Garrett MRN: [id number] DOB: February 15, 1956  [name] is a 68 y.o. year old female who is a primary care patient of Hasanaj, Yancey LABOR, MD . The community resource team was consulted for assistance with Dental Providers  SDOH screenings and interventions completed:  No        Care guide performed the following interventions: Patient provided with information about care guide support team and interviewed to confirm resource needs.Patient requested a list of dental providers for [facility] to be mailed to her   Follow Up Plan:  No further follow up planned at this time. The patient has been provided with needed resources.  Encounter Outcome:  Patient Visit Completed    Jon Colt [facility]  [facility] Guide, Phone: [phone] Fax: [phone] Website: [email or web address]

## 2023-12-30 ENCOUNTER — Telehealth: Payer: Self-pay | Admitting: *Deleted

## 2023-12-30 ENCOUNTER — Telehealth: Payer: Self-pay

## 2023-12-30 NOTE — Progress Notes (Signed)
 Complex Care Management Care Guide Note  12/30/2023 Name: [name] MRN: [id number] DOB: 1955-10-28  Christine Garrett is a 68 y.o. year old female who is a primary care patient of Hasanaj, Yancey LABOR, MD and is actively engaged with the care management team. I reached out to Barnie Hush by phone today to assist with re-scheduling  with the RN Case Manager.  Follow up plan: Telephone appointment with complex care management team member scheduled for:  01/05/24  Leotis Rase [facility], [facility] Guide  Direct Dial: [phone]  Fax [phone]

## 2023-12-30 NOTE — Progress Notes (Signed)
 HEMATOLOGY/ONCOLOGY CLINIC VISIT NOTE  Date of Service: 12/31/2023   Patient Care Team: [name] Yancey LABOR, MD as PCP - General (Internal Medicine) Tobie Tonita POUR, DO as Consulting Physician (Neurology) Charlsie Josette SAILOR, RN as VBCI Care Management  CHIEF COMPLAINTS/PURPOSE OF CONSULTATION:   Follow-up for continued evaluation and management of multiple myeloma  Oncologic History:  68 y.o. female who initially presented with significant weight loss in the context of H. pylori-associated gastritis and alcohol abuse. At the same time, an incidental discovery of a lower thoracic vertebral mass in the context of chronic numbness and weakness in the lower extremities. MRI of the thoracic spine demonstrated a destructive lesion at T8 level and patient underwent surgical treatment. 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 Staging evaluation conducted by our clinic confirmed presence of IgG kappa multiple myeloma. Cytogenetics studies demonstrated normal karyotype, FISH studies are positive for 13q34 & D13S319 loss and no loss of p53. Based on initial information, patient was staged at [facility].    Patient has completed 6 cycles of induction systemic chemotherapy with lenalidomide , bortezomib , and low-dose dexamethasone .  Repeat bone marrow biopsy demonstrated excellent response to treatment and patient subsequently underwent consolidation therapy including melphalan conditioning and autologous stem cell rescue.  Repeat assessment with PET/CT and bone marrow biopsy on 10/13/2017 demonstrates stringent complete response maintenance.  HISTORY OF PRESENTING ILLNESS:  Please see previous note for details on initial presentation  INTERVAL HISTORY:   Christine Garrett is a 68 y.o. female here for continued evaluation and management of her multiple myeloma.  Patient was last seen by me on 08/27/2023 and reported mild difficulty smelling, and mild bilateral leg swelling  worse in evenings.  She was admitted to [facility] hospital mid-May, presenting with chest pain, and was found to have RCA stenosis requiring stent placement.   Patient was most recently see by Thayil PA on 11/28/2023 and reported feeling more unsteady and noted gradual improvement in energy levels. She was started on oral iron and folic acid  by PA Thayil for her iron and folic acid  deficiencies.  Patient presents with a cane during today's visit. Patient has been off of Revlimid  for about 2 months  She reports that she is unsure why she is getting anemic. Patient has not received IV iron in the past. She complains of feeling cold.   Patient complains of feeling very dizzy, sleepy, and having bothersome nausea, which she describes as like a waterfall.  She reports having mild chest pain when she was discharged from the hospital. She notes that she felt better after her stent was placed.   Patient was previously on Aspirin , Plavix, and Eliquis  for four days. She is currently only on Eliquis  and Plavix 75 MG without Aspirin .   She denies any black stools, blood in stools, nose bleeds, gum bleeds, blood in urine, major bruises, new abdominal pain, or groin swelling.   Patient notes that she previously worked with physical therapy every other day and had recently stopped.   She denies abdominal pain. Patient notes constant back pain.   Patient notes that her stent for RVC was placed via radial access. Patient denies any concern for excessive bleeding with his stent placement.   She has been placed dual antiplatelet therapy with blood thinners and plavix.   Patient reports that she started taking folic acid  and ferrous sulphate two days ago. She reports that she is taking vitamin D  and fish oil. She has not been taking vitamin B12  over the last month.   Patient notes that she has emla  cream available at home.   She reports that she has been taken off of metformin .   Patient denies any chest  pain, SOB, or dizziness at this time.   She denies being a Jehovah's witness.   Patient reports that she regularly follows with her PCP every other month and will see her PCP next in November.   MEDICAL HISTORY:  Past Medical History:  Diagnosis Date   Arthritis    knees   Cancer (HCC)    Carpal tunnel syndrome    CHF (congestive heart failure) (HCC)    Diabetes mellitus    type 2   GERD (gastroesophageal reflux disease)    Heart murmur    Little, No concerns per Dr  Jodine pt reported.   Hypertension    controlled using a guided approch with plasma renin activity   Multiple myeloma not having achieved remission (HCC) 01/06/2017   Neuropathy    Peripheral vascular disease (HCC)    Sleep apnea    01/03/2020: per patient, hasn't used CPAP machine in two years due to inability to breathe well with it on    SURGICAL HISTORY: Past Surgical History:  Procedure Laterality Date   BIOPSY  11/28/2016   Procedure: BIOPSY;  Surgeon: Golda Claudis PENNER, MD;  [facility];  Service: Endoscopy;;  gastric   BIOPSY  07/02/2019   Procedure: BIOPSY;  Surgeon: Golda Claudis PENNER, MD;  [facility];  Service: Endoscopy;;   BTL     1982   CATARACT EXTRACTION W/PHACO Left 03/10/2023   Procedure: CATARACT EXTRACTION PHACO AND INTRAOCULAR LENS PLACEMENT (IOC);  Surgeon: Harrie Agent, MD;  Location: [facility];  Service: Ophthalmology;  Laterality: Left;  CDE: 9.43   CERVICAL CONE BIOPSY     cervical lesion   COLONOSCOPY WITH PROPOFOL  N/A 07/02/2019   Procedure: COLONOSCOPY WITH PROPOFOL ;  Surgeon: Golda Claudis PENNER, MD;  [facility];  Service: Endoscopy;  Laterality: N/A;  12:40 - pt can't come earlier due to transportation   ESOPHAGOGASTRODUODENOSCOPY (EGD) WITH PROPOFOL  N/A 11/28/2016   Procedure: ESOPHAGOGASTRODUODENOSCOPY (EGD) WITH PROPOFOL ;  Surgeon: Golda Claudis PENNER, MD;  [facility];  Service: Endoscopy;  Laterality: N/A;  9:25   IR FLUORO GUIDE PORT  INSERTION RIGHT  01/29/2017   IR US  GUIDE VASC ACCESS RIGHT  01/29/2017   lipoma removal     right shoulder 2001   LUMBAR FUSION  01/05/2020    Lumbar Two-Three Lumbar Three-Four Lumbar Four-Five Posterior lumbar interbody fusion with decompression (N/A Spine Lumbar)   MULTIPLE EXTRACTIONS WITH ALVEOLOPLASTY Bilateral 08/24/2012   Procedure: MULTIPLE EXTRACTION WITH ALVEOLOPLASTY BIOPSY OF RIGHT AND LEFT MANDIBLE ;  Surgeon: Glendia CHRISTELLA Primrose, DDS;  Location: [facility];  Service: Oral Surgery;  Laterality: Bilateral;   POLYPECTOMY  07/02/2019   Procedure: POLYPECTOMY;  Surgeon: Golda Claudis PENNER, MD;  [facility];  Service: Endoscopy;;   POSTERIOR LUMBAR FUSION 4 LEVEL N/A 12/26/2016   Procedure: THORACIC EIGHT TUMOR RESECTION, THORACIC SIX- THORACIC TEN POSTERIOR SPINAL FUSION;  Surgeon: Gillie Duncans, MD;  Location: [facility];  Service: Neurosurgery;  Laterality: N/A;  THORACIC 8 TUMOR RESECTION, THORACIC 6- THORACIC 10 POSTERIOR SPINAL FUSION   ROTATOR CUFF REPAIR     right shoulder   TUBAL LIGATION      SOCIAL HISTORY: Social History   Socioeconomic History   Marital status: Single    Spouse name: Not on  file   Number of children: 3   Years of education: Not on file   Highest education level: Not on file  Occupational History   Occupation: on disability   Occupation: former CNA  Tobacco Use   Smoking status: Former    Current packs/day: 0.00    Average packs/day: 0.5 packs/day for 6.0 years (3.0 ttl pk-yrs)    Types: Cigarettes    Start date: 2013    Quit date: 2019    Years since quitting: 6.5   Smokeless tobacco: Never  Vaping Use   Vaping status: Never Used  Substance and Sexual Activity   Alcohol use: Yes    Alcohol/week: 6.0 standard drinks of alcohol    Types: 6 Cans of beer per week   Drug use: No   Sexual activity: Not Currently    Birth control/protection: Post-menopausal  Other Topics Concern   Not on file  Social History Narrative   Lives with son, daughter  and grandkids in a 2 story home but stays on the first floor.  Has 3 children.  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      Right handed   Social Drivers of Health   Financial Resource Strain: Low Risk  (10/30/2023)   Received from [facility]   Overall Financial Resource Strain (CARDIA)    Difficulty of Paying Living Expenses: Not hard at all  Food Insecurity: No Food Insecurity (12/22/2023)   Hunger Vital Sign    Worried About Running Out of Food in the Last Year: Never true    Ran Out of Food in the Last Year: Never true  Transportation Needs: No Transportation Needs (10/31/2023)   [facility] - [facility]    Lack of Transportation (Medical): No    Lack of Transportation (Non-Medical): No  Physical Activity: Inactive (10/31/2023)   Received [facility]   Exercise Vital Sign    On average, how many days per week do you engage in moderate to strenuous exercise (like a brisk walk)?: 0 days    On average, how many minutes do you engage in exercise at this level?: 0 min  Stress: Not on file  Social Connections: Socially Isolated (10/31/2023)   Received [facility]   Social Connection and Isolation Panel    In a typical week, how many times do you talk on the phone with family, friends, or neighbors?: Three times a week    How often do you get together with friends or relatives?: Once a week    How often do you attend church or religious services?: Never    Do you belong to any clubs or organizations such as church groups, unions, fraternal or athletic groups, or school groups?: No    How often do you attend meetings of the clubs or organizations you belong to?: Never    Are you married, widowed, divorced, separated, never married, or living with a partner?: Widowed  Intimate Partner Violence: Not At Risk (10/30/2023)   Received from [facility]   Humiliation, Afraid, Rape, and Kick questionnaire    Within  the last year, have you been afraid of your partner or ex-partner?: No    Within the last year, have you been humiliated or emotionally abused in other ways by your partner or ex-partner?: No    Within the last year, have you been kicked, hit, slapped, or otherwise physically hurt  by your partner or ex-partner?: No    Within the last year, have you been raped or forced to have any kind of sexual activity by your partner or ex-partner?: No    FAMILY HISTORY: Family History  Problem Relation Age of Onset   Diabetes Mother    Hypertension Mother    Heart disease Mother    Alzheimer's disease Mother    Diabetes Father    Heart disease Sister    Diabetes Sister     ALLERGIES:  is allergic to hydrocodone.  MEDICATIONS:  Current Outpatient Medications  Medication Sig Dispense Refill   amLODipine  (NORVASC ) 5 MG tablet Take 5 mg by mouth daily.     apixaban  (ELIQUIS ) 5 MG TABS tablet Take 1 tablet (5 mg total) by mouth 2 (two) times daily.     atorvastatin  (LIPITOR) 40 MG tablet Take 40 mg by mouth daily.     clopidogrel (PLAVIX) 75 MG tablet Take 75 mg by mouth. (Patient not taking: Reported on 12/22/2023)     cyanocobalamin  (VITAMIN B12) 1000 MCG tablet Take 1 tablet (1,000 mcg total) by mouth daily. 30 tablet 3   empagliflozin (JARDIANCE) 10 MG TABS tablet Take 1 tablet by mouth daily.     ezetimibe (ZETIA) 10 MG tablet Take 1 tablet by mouth daily.     ferrous sulfate  325 (65 FE) MG EC tablet Take 1 tablet (325 mg total) by mouth daily with breakfast. 30 tablet 3   folic acid  (FOLVITE ) 1 MG tablet Take 1 tablet (1 mg total) by mouth daily. 30 tablet 2   hydrALAZINE  (APRESOLINE ) 25 MG tablet Take 25 mg by mouth 2 (two) times daily.     insulin  degludec (TRESIBA  FLEXTOUCH) 100 UNIT/ML SOPN FlexTouch Pen Inject 0.5 mLs (50 Units total) into the skin daily at 10 pm. 5 pen 2   Insulin  Pen Needle (B-D ULTRAFINE III SHORT PEN) 31G X 8 MM MISC 1 each by Does not apply route as directed. 100 each 3    lenalidomide  (REVLIMID ) 10 MG capsule Take 1 capsule (10 mg total) by mouth daily. Take 1 capsule (10 mg total)  by mouth daily for 21 days. Take 7 days off and then repeat. (Patient not taking: Reported on 12/22/2023) 21 capsule 0   lipase/protease/amylase (CREON ) 36000 UNITS CPEP capsule Take 1 capsule (36,000 Units total) by mouth 3 (three) times daily with meals. (Patient not taking: Reported on 12/22/2023) 180 capsule 2   metFORMIN  (GLUCOPHAGE ) 1000 MG tablet Take 1,000 mg by mouth 2 (two) times daily.      metoprolol  succinate (TOPROL -XL) 50 MG 24 hr tablet Take 50 mg by mouth daily. Take with or immediately following a meal. (Patient taking differently: Take 25 mg by mouth daily. Take with or immediately following a meal.)     Omega-3 Fatty Acids (FISH OIL) 1000 MG CPDR Take 1,000 mg by mouth daily. Omega 3 300 mg     ondansetron  (ZOFRAN ) 8 MG tablet Take 1 tablet (8 mg total) by mouth every 8 (eight) hours as needed for nausea or vomiting. 30 tablet 0   potassium chloride  (MICRO-K ) 10 MEQ CR capsule Take 10 mEq by mouth daily.     Vitamin D , Ergocalciferol , (DRISDOL ) 50000 units CAPS capsule Take 50,000 Units by mouth every Monday.      Zoledronic  Acid (ZOMETA  IV) Inject 4 mg into the vein every 3 (three) months. Infusion     No current facility-administered medications for this visit.    REVIEW OF SYSTEMS:  10 Point review of Systems was done is negative except as noted above.   PHYSICAL EXAMINATION:  .BP (!) 169/74   Pulse 89   Temp (!) 97.3 F (36.3 C)   Resp 20   Wt 213 lb 4.8 oz (96.8 kg)   SpO2 100%   BMI 31.50 kg/m  GENERAL:alert, in no acute distress and comfortable SKIN: no acute rashes, no significant lesions EYES: conjunctiva are pink and non-injected, sclera anicteric OROPHARYNX: MMM, no exudates, no oropharyngeal erythema or ulceration NECK: supple, no JVD LYMPH:  no palpable lymphadenopathy in the cervical, axillary or inguinal regions LUNGS: clear to  auscultation b/l with normal respiratory effort HEART: regular rate & rhythm ABDOMEN:  normoactive bowel sounds , non tender, not distended. Extremity: no pedal edema PSYCH: alert & oriented x 3 with fluent speech NEURO: no focal motor/sensory deficits   LABORATORY DATA:  I have reviewed the data as listed      Latest Ref Rng & Units 12/31/2023    2:32 PM 11/28/2023   10:22 AM 08/27/2023    8:22 AM  CBC  WBC 4.0 - 10.5 K/uL 6.6  5.2  4.8   Hemoglobin 12.0 - 15.0 g/dL 7.4  8.2  88.5   Hematocrit 36.0 - 46.0 % 24.0  25.4  33.8   Platelets 150 - 400 K/uL 376  268  238     .    Latest Ref Rng & Units 12/31/2023    2:32 PM 11/28/2023   10:22 AM 08/27/2023    8:22 AM  CMP  Glucose 70 - 99 mg/dL 865  863  788   BUN 8 - 23 mg/dL 9  8  7    Creatinine 0.44 - 1.00 mg/dL 8.77  8.76  8.75   Sodium 135 - 145 mmol/L 139  141  140   Potassium 3.5 - 5.1 mmol/L 3.9  4.3  3.6   Chloride 98 - 111 mmol/L 110  113  109   CO2 22 - 32 mmol/L 23  22  25    Calcium  8.9 - 10.3 mg/dL 9.0  9.1  8.9   Total Protein 6.5 - 8.1 g/dL 7.1  7.1  7.0   Total Bilirubin 0.0 - 1.2 mg/dL 0.3  0.5  0.5   Alkaline Phos 38 - 126 U/L 101  91  83   AST 15 - 41 U/L 22  33  22   ALT 0 - 44 U/L 21  39  21    Bone marrow biopsy 02/18/2023:    03/25/18 SPEP and SFLC:     04/21/17 Cytogenetics:    01/17/17 Cytogenetics:    RADIOGRAPHIC STUDIES: I have personally reviewed the radiological images as listed and agreed with the findings in the report.  No results found.   10/13/17 PET/CT     ASSESSMENT & PLAN:   68 y.o. female with  1. Multiple myeloma Autologous PBSCT on 07/01/17. IgG kappa multiple myeloma. Cytogenetics studies demonstrated normal karyotype, FISH studies are positive for 13q34 & D13S319 loss and no loss of p53. Based on initial information, patient was staged at [facility].  Completed 6 cycles of induction with Revlimid , Velcade  and dexamethasone   12/09/2018 lumbar spine xray with results  revealing No acute osseous abnormality. Extensive multilevel degenerative changes are noted throughout the lumbar spine.  12/09/2018 sacrum and coccyx xray with results revealing No acute osseous abnormality. Degenerative changes are noted of both hips.  2. Grade 2 neuropathy -controlled  02/26/18 NCV with EMG which revealed  The electrophysiologic findings are most consistent with a chronic, length-dependent sensorimotor axonal and demyelinating polyneuropathy affecting the right side. Overall, these findings are moderate to severe in degree electrically. Incidentally, there is a right Martin-Gruber anastomosis, a normal variant.   3. Rt LE calf veins - possible clot ?chronic.  01/29/18 VAS US  BLE which revealed Right: Ultrasound is unable to distinguish whether obstruction in the Peroneal veins, and great saphenous vein, and superficial veins/varciosities is acute or chronic. No cystic structure found in the popliteal fossa. Left: no evidence of DVT.   4 CAD recent PCI PLAN:  -Discussed lab results on 12/31/2023 in detail with patient. CBC showed WBC of 6.6K, hemoglobin of 7.4, and platelets of 376K. -patient is becoming rather anemic -hgb decreased from 8.2 to 7.4 over the last month -discussed that some of her anemia could be related to blood loss from her recent stent placement procedure -anemia is not due to myeloma since her last myeloma labs are stable -there is no suggestion of overall bone marrow suppressive given her PLT and WBCs normal  -RDW elevated at 15.9, suggests nutritional deficiency likely iron deficiency -patient is noted to have multiple nutritional deficiencies, including iron deficiency, B12 deficiency, and folic acid  deficiency -discussed that Metformin  can cause some deficiencies in B vitamins -will review her myeloma labs from today, which is currently pending -her last myeloma panel from mid-June showed that her M protein was stable at 0.3 g/dL -we will hold her  revlimid  until her anemia resolves, given that Revlimid  can add to anemia, though it is unlikely that anemia is primarily causing anemia.  -discussed that Eliquis  and Plavix does increase her risk of bleeding  -discussed that if hgb<7, there would be indication for blood transfusion. However, with hgb between 7-8 there may be indication for blood transfusion which would be dependent on if she is symptomatic.  -Patient is not inclined to receive a blood transfusion at this time based on her symptoms  -discussed importance to ensure that her hgb is not too low given she recently had cardiac intervention.  -discussed that her iron deficiency needs to be treated more aggressively, and we would recommend IV iron -patient is agreeable to IV iron infusions  -will try to plan for IV iron starting today to try to avoid requiring a blood transfusion if we can  -recommend stool testing to ensure that there is no blood in the stools -patient is agreeable to providing a stool sample for stool testing -continue B12 supplements -continue folic acid  supplements -discussed that there is a need to monitor her more closely to ensure that her hgb is not actively dropping  -continue to use emla  cream for her port  -will repeat labs in 2 weeks -discussed that if she is still anemic after her deficiencies are corrected, we may need additional testing  FOLLOW-UP: IV venofer 300mg  weekly x 3 doses RTC with Dr Onesimo with labs in 3 weeks  The total time spent in the appointment was 30 minutes* .  All of the patient's questions were answered with apparent satisfaction. The patient knows to call the clinic with any problems, questions or concerns.   Emaline Onesimo MD MS AAHIVMS [facility] Surgical Hematology/Oncology Physician [facility]  .*Total Encounter Time as defined by the Centers for Medicare and Medicaid Services includes, in addition to the face-to-face time of a patient visit (documented in the note above)  non-face-to-face time: obtaining and reviewing outside history, ordering and reviewing medications, tests or procedures,  care coordination (communications with other health care professionals or caregivers) and documentation in the medical record.    I,Mitra Faeizi,acting as a Neurosurgeon for Emaline Saran, MD.,have documented all relevant documentation on the behalf of Emaline Saran, MD,as directed by  Emaline Saran, MD while in the presence of Emaline Saran, MD.  .I have reviewed the above documentation for accuracy and completeness, and I agree with the above. .Emanii Bugbee Kishore Parry Po MD

## 2023-12-31 ENCOUNTER — Inpatient Hospital Stay: Attending: Hematology | Admitting: Hematology

## 2023-12-31 ENCOUNTER — Inpatient Hospital Stay

## 2023-12-31 ENCOUNTER — Other Ambulatory Visit: Payer: Self-pay | Admitting: *Deleted

## 2023-12-31 ENCOUNTER — Other Ambulatory Visit

## 2023-12-31 VITALS — BP 169/74 | HR 89 | Temp 97.3°F | Resp 20 | Wt 213.3 lb

## 2023-12-31 DIAGNOSIS — C9001 Multiple myeloma in remission: Secondary | ICD-10-CM

## 2023-12-31 DIAGNOSIS — C9000 Multiple myeloma not having achieved remission: Secondary | ICD-10-CM | POA: Insufficient documentation

## 2023-12-31 DIAGNOSIS — E611 Iron deficiency: Secondary | ICD-10-CM | POA: Insufficient documentation

## 2023-12-31 DIAGNOSIS — D508 Other iron deficiency anemias: Secondary | ICD-10-CM

## 2023-12-31 DIAGNOSIS — D509 Iron deficiency anemia, unspecified: Secondary | ICD-10-CM | POA: Insufficient documentation

## 2023-12-31 DIAGNOSIS — E538 Deficiency of other specified B group vitamins: Secondary | ICD-10-CM | POA: Insufficient documentation

## 2023-12-31 DIAGNOSIS — D649 Anemia, unspecified: Secondary | ICD-10-CM | POA: Insufficient documentation

## 2023-12-31 DIAGNOSIS — Z95828 Presence of other vascular implants and grafts: Secondary | ICD-10-CM

## 2023-12-31 DIAGNOSIS — Z79899 Other long term (current) drug therapy: Secondary | ICD-10-CM | POA: Diagnosis not present

## 2023-12-31 DIAGNOSIS — Z87891 Personal history of nicotine dependence: Secondary | ICD-10-CM | POA: Insufficient documentation

## 2023-12-31 LAB — CBC WITH DIFFERENTIAL (CANCER CENTER ONLY)
Abs Immature Granulocytes: 0.01 K/uL (ref 0.00–0.07)
Basophils Absolute: 0 K/uL (ref 0.0–0.1)
Basophils Relative: 1 %
Eosinophils Absolute: 0.1 K/uL (ref 0.0–0.5)
Eosinophils Relative: 2 %
HCT: 24 % — ABNORMAL LOW (ref 36.0–46.0)
Hemoglobin: 7.4 g/dL — ABNORMAL LOW (ref 12.0–15.0)
Immature Granulocytes: 0 %
Lymphocytes Relative: 36 %
Lymphs Abs: 2.4 K/uL (ref 0.7–4.0)
MCH: 27.6 pg (ref 26.0–34.0)
MCHC: 30.8 g/dL (ref 30.0–36.0)
MCV: 89.6 fL (ref 80.0–100.0)
Monocytes Absolute: 0.4 K/uL (ref 0.1–1.0)
Monocytes Relative: 6 %
Neutro Abs: 3.6 K/uL (ref 1.7–7.7)
Neutrophils Relative %: 55 %
Platelet Count: 376 K/uL (ref 150–400)
RBC: 2.68 MIL/uL — ABNORMAL LOW (ref 3.87–5.11)
RDW: 15.9 % — ABNORMAL HIGH (ref 11.5–15.5)
WBC Count: 6.6 K/uL (ref 4.0–10.5)
nRBC: 0 % (ref 0.0–0.2)

## 2023-12-31 LAB — CMP (CANCER CENTER ONLY)
ALT: 21 U/L (ref 0–44)
AST: 22 U/L (ref 15–41)
Albumin: 3.8 g/dL (ref 3.5–5.0)
Alkaline Phosphatase: 101 U/L (ref 38–126)
Anion gap: 6 (ref 5–15)
BUN: 9 mg/dL (ref 8–23)
CO2: 23 mmol/L (ref 22–32)
Calcium: 9 mg/dL (ref 8.9–10.3)
Chloride: 110 mmol/L (ref 98–111)
Creatinine: 1.22 mg/dL — ABNORMAL HIGH (ref 0.44–1.00)
GFR, Estimated: 49 mL/min — ABNORMAL LOW (ref >60)
Glucose, Bld: 134 mg/dL — ABNORMAL HIGH (ref 70–99)
Potassium: 3.9 mmol/L (ref 3.5–5.1)
Sodium: 139 mmol/L (ref 135–145)
Total Bilirubin: 0.3 mg/dL (ref 0.0–1.2)
Total Protein: 7.1 g/dL (ref 6.5–8.1)

## 2023-12-31 LAB — RETIC PANEL
Immature Retic Fract: 26.1 % — ABNORMAL HIGH (ref 2.3–15.9)
RBC.: 2.57 MIL/uL — ABNORMAL LOW (ref 3.87–5.11)
Retic Count, Absolute: 63 K/uL (ref 19.0–186.0)
Retic Ct Pct: 2.5 % (ref 0.4–3.1)
Reticulocyte Hemoglobin: 24 pg — ABNORMAL LOW (ref >27.9)

## 2023-12-31 MED ORDER — SODIUM CHLORIDE 0.9% FLUSH
10.0000 mL | Freq: Once | INTRAVENOUS | Status: AC
Start: 2023-12-31 — End: 2023-12-31
  Administered 2023-12-31: 10 mL

## 2023-12-31 MED ORDER — HEPARIN SOD (PORK) LOCK FLUSH 100 UNIT/ML IV SOLN
500.0000 [IU] | Freq: Once | INTRAVENOUS | Status: AC
Start: 2023-12-31 — End: 2023-12-31
  Administered 2023-12-31: 500 [IU]

## 2024-01-01 ENCOUNTER — Other Ambulatory Visit: Payer: Self-pay

## 2024-01-01 DIAGNOSIS — C9001 Multiple myeloma in remission: Secondary | ICD-10-CM

## 2024-01-01 LAB — KAPPA/LAMBDA LIGHT CHAINS
Kappa free light chain: 69.1 mg/L — ABNORMAL HIGH (ref 3.3–19.4)
Kappa, lambda light chain ratio: 2.52 — ABNORMAL HIGH (ref 0.26–1.65)
Lambda free light chains: 27.4 mg/L — ABNORMAL HIGH (ref 5.7–26.3)

## 2024-01-01 MED ORDER — LENALIDOMIDE 10 MG PO CAPS: 10.0000 mg | ORAL_CAPSULE | Freq: Every day | ORAL | 0 refills | Status: DC

## 2024-01-02 ENCOUNTER — Telehealth: Payer: Self-pay | Admitting: Hematology

## 2024-01-02 ENCOUNTER — Inpatient Hospital Stay

## 2024-01-02 LAB — MULTIPLE MYELOMA PANEL, SERUM
Albumin SerPl Elph-Mcnc: 3.4 g/dL (ref 2.9–4.4)
Albumin/Glob SerPl: 1.1 (ref 0.7–1.7)
Alpha 1: 0.2 g/dL (ref 0.0–0.4)
Alpha2 Glob SerPl Elph-Mcnc: 0.8 g/dL (ref 0.4–1.0)
B-Globulin SerPl Elph-Mcnc: 1 g/dL (ref 0.7–1.3)
Gamma Glob SerPl Elph-Mcnc: 1.3 g/dL (ref 0.4–1.8)
Globulin, Total: 3.3 g/dL (ref 2.2–3.9)
IgA: 250 mg/dL (ref 87–352)
IgG (Immunoglobin G), Serum: 1272 mg/dL (ref 586–1602)
IgM (Immunoglobulin M), Srm: 37 mg/dL (ref 26–217)
M Protein SerPl Elph-Mcnc: 0.3 g/dL — ABNORMAL HIGH
Total Protein ELP: 6.7 g/dL (ref 6.0–8.5)

## 2024-01-04 ENCOUNTER — Encounter: Payer: Self-pay | Admitting: Hematology

## 2024-01-05 ENCOUNTER — Other Ambulatory Visit: Payer: Self-pay | Admitting: *Deleted

## 2024-01-05 ENCOUNTER — Encounter: Payer: Self-pay | Admitting: *Deleted

## 2024-01-05 NOTE — Patient Instructions (Signed)
 Visit Information  Thank you for taking time to visit with me today. Please don't hesitate to contact me if I can be of assistance to you before our next scheduled appointment.  Your next care management appointment is by telephone on 02/06/24 at 10:00.  Please call the care guide team at [phone] if you need to cancel, schedule, or reschedule an appointment.   Please call the [facility]: [phone] call [phone] if you are experiencing a Mental Health or Behavioral Health Crisis or need someone to talk to.  Josette Pellet, RN, BSN [place]  [facility] RN Care Manager Direct Dial: [phone]  Fax: [phone]

## 2024-01-05 NOTE — Patient Outreach (Signed)
 Complex Care Management   Visit Note  01/05/2024  Name:  Christine Garrett MRN: [id number] DOB: [birth year]  Situation: Referral received for Complex Care Management related to Heart Failure and CAD. I obtained verbal consent from Patient.  Visit completed with Barnie Hush on the phone  Background:   Past Medical History:  Diagnosis Date   Arthritis    knees   Cancer (HCC)    Carpal tunnel syndrome    CHF (congestive heart failure) (HCC)    Diabetes mellitus    type 2   GERD (gastroesophageal reflux disease)    Heart murmur    Little, No concerns per Dr  Jodine pt reported.   Hypertension    controlled using a guided approch with plasma renin activity   Multiple myeloma not having achieved remission (HCC) 01/06/2017   Neuropathy    Peripheral vascular disease (HCC)    Sleep apnea    01/03/2020: per patient, hasn't used CPAP machine in two years due to inability to breathe well with it on    Assessment: Patient Reported Symptoms:  Cognitive Cognitive Status: Alert and oriented to person, place, and time, Normal speech and language skills, No symptoms reported Cognitive/Intellectual Conditions Management [RPT]: None reported or documented in medical history or problem list   Health Maintenance Behaviors: Annual physical exam Healing Pattern: Unsure Health Facilitated by: Rest  Neurological Neurological Review of Symptoms: No symptoms reported    HEENT HEENT Symptoms Reported: No symptoms reported      Cardiovascular Cardiovascular Symptoms Reported: No symptoms reported Does patient have uncontrolled Hypertension?: No Cardiovascular Management Strategies: Medication therapy, Routine screening Weight: 213 lb (96.6 kg) Cardiovascular Self-Management Outcome: 4 (good) Cardiovascular Comment: Cardiology appointment scheduled for 05/18/24 at 10:40 at [facility] in [place]. Transportation available. Monitoring blood pressure and weight daily.  Respiratory       Endocrine Endocrine Symptoms Reported: No symptoms reported Is patient diabetic?: Yes Is patient checking blood sugars at home?: Yes List most recent blood sugar readings, include date and time of day: 130 last night before administering Lantus  Endocrine Self-Management Outcome: 4 (good) Endocrine Comment: Managed by PCP. Last visit 08/12/23. Next visit schduled for 05/2024. No readings over 200 or less than 70.  Gastrointestinal Gastrointestinal Symptoms Reported: No symptoms reported      Genitourinary Genitourinary Symptoms Reported: No symptoms reported    Integumentary Integumentary Symptoms Reported: No symptoms reported    Musculoskeletal Musculoskelatal Symptoms Reviewed: Difficulty walking, Back pain Additional Musculoskeletal Details: chronic mid back pain due to multiple myeloma in spine. Followed by oncology. Musculoskeletal Management Strategies: Medication therapy Musculoskeletal Self-Management Outcome: 3 (uncertain) Musculoskeletal Comment: walker for ambulation Falls in the past year?: No Number of falls in past year: 1 or less Was there an injury with Fall?: No Fall Risk Category Calculator: 0 Patient Fall Risk Level: Low Fall Risk Patient at Risk for Falls Due to: Impaired mobility  Psychosocial Psychosocial Symptoms Reported: No symptoms reported Additional Psychological Details: Still watching grandchildren, 8 yrs and 24 yrs old, until school starts back in August. Behavioral Management Strategies: Activity Behavioral Health Self-Management Outcome: 4 (good)          04/15/2023   11:17 AM  Depression screen PHQ 2/9  Decreased Interest 0  Down, Depressed, Hopeless 0  PHQ - 2 Score 0    Vitals:   01/05/24 1046  BP: 137/71    Medications Reviewed Today     Reviewed by Charlsie Josette SAILOR, RN (Registered Nurse) on 01/05/24 at 1041  Med  List Status: <None>   Medication Order Taking? Sig Documenting Provider Last Dose Status Informant  amLODipine   (NORVASC ) 5 MG tablet [id number] Yes Take 5 mg by mouth daily. [provider]  Active Self  apixaban  (ELIQUIS ) 5 MG TABS tablet [id number] Yes Take 1 tablet (5 mg total) by mouth 2 (two) times daily. Golda Claudis PENNER, MD  Active Self  atorvastatin  (LIPITOR) 40 MG tablet [id number] Yes Take 40 mg by mouth daily. [provider]  Active Self           Med Note LUCIAN, LAVAUN JINNY [place] Nov 05, 2023  2:53 PM) Taking 80mg  daily  clopidogrel (PLAVIX) 75 MG tablet [id number]  Take 75 mg by mouth.  Patient not taking: Reported on 01/05/2024   [provider]  Active   cyanocobalamin  (VITAMIN B12) 1000 MCG tablet [id number] Yes Take 1 tablet (1,000 mcg total) by mouth daily. Thayil, Irene T, PA-C  Active   empagliflozin (JARDIANCE) 10 MG TABS tablet [id number] Yes Take 1 tablet by mouth daily. [provider]  Active   ezetimibe (ZETIA) 10 MG tablet [id number] Yes Take 1 tablet by mouth daily. [provider]  Active   ferrous sulfate  325 (65 FE) MG EC tablet [id number] Yes Take 1 tablet (325 mg total) by mouth daily with breakfast. Neomi Lis T, PA-C  Active   folic acid  (FOLVITE ) 1 MG tablet [id number] Yes Take 1 tablet (1 mg total) by mouth daily. Thayil, Irene T, PA-C  Active   hydrALAZINE  (APRESOLINE ) 25 MG tablet [id number] Yes Take 25 mg by mouth 2 (two) times daily. [provider]  Active Self           Med Note LUCIAN, DIONNE J   Wed Nov 05, 2023  3:06 PM) Taking 50 mg TID  insulin  degludec (TRESIBA  FLEXTOUCH) 100 UNIT/ML SOPN FlexTouch Pen [id number] Yes Inject 0.5 mLs (50 Units total) into the skin daily at 10 pm. Nida, Gebreselassie W, MD  Active            Med Note LUCIAN, DIONNE J   Thu Nov 13, 2023 11:17 AM) Taking 50 units daily  Insulin  Pen Needle (B-D ULTRAFINE III SHORT PEN) 31G X 8 MM MISC [id number] Yes 1 each by Does not apply route as directed. Nida, Gebreselassie W, MD  Active Self  lenalidomide  (REVLIMID ) 10 MG capsule [id number] Yes Take  1 capsule (10 mg total) by mouth daily. Take 1 capsule (10 mg total)  by mouth daily for 21 days. Take 7 days off and then repeat. Onesimo Emaline Brink, MD  Active   lipase/protease/amylase (CREON ) 36000 UNITS CPEP capsule [id number] Yes Take 1 capsule (36,000 Units total) by mouth 3 (three) times daily with meals. Onesimo Emaline Brink, MD  Active   metFORMIN  (GLUCOPHAGE ) 1000 MG tablet [id number] Yes Take 1,000 mg by mouth 2 (two) times daily.  [provider]  Active Self           Med Note LUCIAN, DIONNE J   Thu Nov 13, 2023 11:18 AM) Taking 1000 mg once daily  metoprolol  succinate (TOPROL -XL) 50 MG 24 hr tablet [id number] Yes Take 50 mg by mouth daily. Take with or immediately following a meal.  Patient taking differently: Take 25 mg by mouth daily. Take with or immediately following a meal.   [provider]  Active Self  Omega-3 Fatty Acids (FISH OIL) 1000 MG CPDR [id number] Yes Take 1,000 mg by mouth daily. Omega 3 300 mg [provider]  Active Self  ondansetron  (ZOFRAN ) 8 MG tablet [id number] Yes Take 1 tablet (8 mg total) by mouth every 8 (eight) hours as needed for nausea or vomiting. Onesimo Emaline Brink, MD  Active   potassium chloride  (MICRO-K ) 10 MEQ CR capsule [id number] Yes Take 10 mEq by mouth daily. [provider]  Active Self  Vitamin D , Ergocalciferol , (DRISDOL ) 50000 units CAPS capsule [id number] Yes Take 50,000 Units by mouth every Monday.  [provider]  Active Self  Zoledronic  Acid (ZOMETA  IV) [id number] Yes Inject 4 mg into the vein every 3 (three) months. Infusion [provider]  Active   Med List Note Carmella Asberry [facility] 02/26/23 1511): Revlimid  filled through [facility]            Recommendation:   Continue Current Plan of Care  Follow Up Plan:   Telephone follow up appointment date/time:  02/06/24 at 10:00  Josette Pellet, RN, BSN [place]  [facility] Population [facility] RN Care Manager Direct Dial:  [phone]  Fax: [phone]

## 2024-01-07 ENCOUNTER — Other Ambulatory Visit

## 2024-01-07 ENCOUNTER — Ambulatory Visit

## 2024-01-09 ENCOUNTER — Other Ambulatory Visit: Payer: Self-pay

## 2024-01-09 DIAGNOSIS — C9001 Multiple myeloma in remission: Secondary | ICD-10-CM

## 2024-01-13 ENCOUNTER — Encounter (INDEPENDENT_AMBULATORY_CARE_PROVIDER_SITE_OTHER): Payer: Self-pay | Admitting: *Deleted

## 2024-01-16 ENCOUNTER — Inpatient Hospital Stay

## 2024-01-16 ENCOUNTER — Inpatient Hospital Stay: Attending: Hematology

## 2024-01-16 ENCOUNTER — Other Ambulatory Visit

## 2024-01-16 VITALS — BP 138/76 | HR 79 | Temp 98.7°F | Resp 18

## 2024-01-16 DIAGNOSIS — C9001 Multiple myeloma in remission: Secondary | ICD-10-CM

## 2024-01-16 DIAGNOSIS — Z95828 Presence of other vascular implants and grafts: Secondary | ICD-10-CM

## 2024-01-16 DIAGNOSIS — D509 Iron deficiency anemia, unspecified: Secondary | ICD-10-CM | POA: Insufficient documentation

## 2024-01-16 LAB — CMP (CANCER CENTER ONLY)
ALT: 19 U/L (ref 0–44)
AST: 28 U/L (ref 15–41)
Albumin: 3.9 g/dL (ref 3.5–5.0)
Alkaline Phosphatase: 82 U/L (ref 38–126)
Anion gap: 6 (ref 5–15)
BUN: 11 mg/dL (ref 8–23)
CO2: 22 mmol/L (ref 22–32)
Calcium: 8.7 mg/dL — ABNORMAL LOW (ref 8.9–10.3)
Chloride: 111 mmol/L (ref 98–111)
Creatinine: 1.12 mg/dL — ABNORMAL HIGH (ref 0.44–1.00)
GFR, Estimated: 55 mL/min — ABNORMAL LOW (ref >60)
Glucose, Bld: 116 mg/dL — ABNORMAL HIGH (ref 70–99)
Potassium: 3.9 mmol/L (ref 3.5–5.1)
Sodium: 139 mmol/L (ref 135–145)
Total Bilirubin: 0.3 mg/dL (ref 0.0–1.2)
Total Protein: 7.1 g/dL (ref 6.5–8.1)

## 2024-01-16 LAB — CBC WITH DIFFERENTIAL (CANCER CENTER ONLY)
Abs Immature Granulocytes: 0.01 K/uL (ref 0.00–0.07)
Basophils Absolute: 0 K/uL (ref 0.0–0.1)
Basophils Relative: 0 %
Eosinophils Absolute: 0 K/uL (ref 0.0–0.5)
Eosinophils Relative: 1 %
HCT: 26.8 % — ABNORMAL LOW (ref 36.0–46.0)
Hemoglobin: 8.3 g/dL — ABNORMAL LOW (ref 12.0–15.0)
Immature Granulocytes: 0 %
Lymphocytes Relative: 25 %
Lymphs Abs: 1.3 K/uL (ref 0.7–4.0)
MCH: 28.1 pg (ref 26.0–34.0)
MCHC: 31 g/dL (ref 30.0–36.0)
MCV: 90.8 fL (ref 80.0–100.0)
Monocytes Absolute: 0.3 K/uL (ref 0.1–1.0)
Monocytes Relative: 5 %
Neutro Abs: 3.8 K/uL (ref 1.7–7.7)
Neutrophils Relative %: 69 %
Platelet Count: 255 K/uL (ref 150–400)
RBC: 2.95 MIL/uL — ABNORMAL LOW (ref 3.87–5.11)
RDW: 18.6 % — ABNORMAL HIGH (ref 11.5–15.5)
WBC Count: 5.4 K/uL (ref 4.0–10.5)
nRBC: 0 % (ref 0.0–0.2)

## 2024-01-16 MED ORDER — IRON SUCROSE 300 MG IVPB - SIMPLE MED
300.0000 mg | Freq: Once | Status: AC
  Administered 2024-01-16: 300 mg via INTRAVENOUS
  Filled 2024-01-16: qty 300

## 2024-01-16 MED ORDER — LORATADINE 10 MG PO TABS
10.0000 mg | ORAL_TABLET | Freq: Once | ORAL | Status: AC
  Administered 2024-01-16: 10 mg via ORAL
  Filled 2024-01-16: qty 1

## 2024-01-16 MED ORDER — SODIUM CHLORIDE 0.9% FLUSH
10.0000 mL | Freq: Once | INTRAVENOUS | Status: AC
  Administered 2024-01-16: 10 mL

## 2024-01-16 MED ORDER — ACETAMINOPHEN 325 MG PO TABS
650.0000 mg | ORAL_TABLET | Freq: Once | ORAL | Status: AC
  Administered 2024-01-16: 650 mg via ORAL
  Filled 2024-01-16: qty 2

## 2024-01-23 ENCOUNTER — Inpatient Hospital Stay

## 2024-01-23 VITALS — BP 156/72 | HR 74 | Temp 98.2°F | Resp 18

## 2024-01-23 DIAGNOSIS — Z95828 Presence of other vascular implants and grafts: Secondary | ICD-10-CM

## 2024-01-23 DIAGNOSIS — C9001 Multiple myeloma in remission: Secondary | ICD-10-CM

## 2024-01-23 DIAGNOSIS — D509 Iron deficiency anemia, unspecified: Secondary | ICD-10-CM | POA: Diagnosis not present

## 2024-01-23 MED ORDER — IRON SUCROSE 300 MG IVPB - SIMPLE MED: 300.0000 mg | Freq: Once | Status: DC

## 2024-01-23 MED ORDER — ACETAMINOPHEN 325 MG PO TABS
650.0000 mg | ORAL_TABLET | Freq: Once | ORAL | Status: AC
  Administered 2024-01-23: 650 mg via ORAL
  Filled 2024-01-23: qty 2

## 2024-01-23 MED ORDER — LORATADINE 10 MG PO TABS
10.0000 mg | ORAL_TABLET | Freq: Once | ORAL | Status: AC
  Administered 2024-01-23: 10 mg via ORAL
  Filled 2024-01-23: qty 1

## 2024-01-23 MED ORDER — IRON SUCROSE 20 MG/ML IV SOLN
300.0000 mg | Freq: Once | INTRAVENOUS | Status: AC
  Administered 2024-01-23: 300 mg via INTRAVENOUS
  Filled 2024-01-23: qty 300

## 2024-01-23 NOTE — Patient Instructions (Signed)

## 2024-01-28 ENCOUNTER — Ambulatory Visit

## 2024-01-30 ENCOUNTER — Inpatient Hospital Stay

## 2024-01-30 VITALS — BP 142/72 | HR 72 | Temp 98.2°F | Resp 18

## 2024-01-30 DIAGNOSIS — Z95828 Presence of other vascular implants and grafts: Secondary | ICD-10-CM

## 2024-01-30 DIAGNOSIS — C9001 Multiple myeloma in remission: Secondary | ICD-10-CM

## 2024-01-30 DIAGNOSIS — D509 Iron deficiency anemia, unspecified: Secondary | ICD-10-CM | POA: Diagnosis not present

## 2024-01-30 MED ORDER — SODIUM CHLORIDE 0.9 % IV SOLN
300.0000 mg | Freq: Once | INTRAVENOUS | Status: AC
  Administered 2024-01-30: 300 mg via INTRAVENOUS
  Filled 2024-01-30: qty 300

## 2024-01-30 MED ORDER — LORATADINE 10 MG PO TABS
10.0000 mg | ORAL_TABLET | Freq: Once | ORAL | Status: AC
  Administered 2024-01-30: 10 mg via ORAL
  Filled 2024-01-30: qty 1

## 2024-01-30 MED ORDER — ACETAMINOPHEN 325 MG PO TABS
650.0000 mg | ORAL_TABLET | Freq: Once | ORAL | Status: AC
  Administered 2024-01-30: 650 mg via ORAL
  Filled 2024-01-30: qty 2

## 2024-01-30 NOTE — Patient Instructions (Signed)

## 2024-02-06 ENCOUNTER — Other Ambulatory Visit: Payer: Self-pay | Admitting: *Deleted

## 2024-02-06 ENCOUNTER — Ambulatory Visit: Admitting: *Deleted

## 2024-02-06 NOTE — Patient Instructions (Signed)
 Visit Information  Thank you for taking time to visit with me today. Please don't hesitate to contact me if I can be of assistance to you before our next scheduled appointment.  Your next care management appointment is by telephone on 03-08-2024 at 10:00 am  Telephone follow-up in 1 month  Please call the care guide team at [phone] if you need to cancel, schedule, or reschedule an appointment.   Please call the Suicide and Crisis Lifeline: 988 call the [place]  National Suicide Prevention Lifeline: (938) 094-0021 or TTY: (514) 333-4800 TTY (501)078-3171) to talk to a trained counselor call 1-800-273-TALK (toll free, 24 hour hotline) call the [facility]: [phone] call 911 if you are experiencing a Mental Health or Behavioral Health Crisis or need someone to talk to.  Rosina Forte, BSN RN [facility], [facility] RN Care Manager Direct Dial: [phone]  Fax: [phone]

## 2024-02-06 NOTE — Patient Outreach (Signed)
 Complex Care Management   Visit Note  02/06/2024  Name:  Christine Garrett MRN: [id number] DOB: 16-Oct-1955  Situation: Referral received for Complex Care Management related to Heart Failure I obtained verbal consent from Patient.  Visit completed with Patient  on the phone  Background:   Past Medical History:  Diagnosis Date   Arthritis    knees   Cancer (HCC)    Carpal tunnel syndrome    CHF (congestive heart failure) (HCC)    Diabetes mellitus    type 2   GERD (gastroesophageal reflux disease)    Heart murmur    Little, No concerns per Dr  Jodine pt reported.   Hypertension    controlled using a guided approch with plasma renin activity   Multiple myeloma not having achieved remission (HCC) 01/06/2017   Neuropathy    Peripheral vascular disease (HCC)    Sleep apnea    01/03/2020: per patient, hasn't used CPAP machine in two years due to inability to breathe well with it on    Assessment: Patient Reported Symptoms:  Cognitive Cognitive Status: No symptoms reported Cognitive/Intellectual Conditions Management [RPT]: None reported or documented in medical history or problem list   Health Maintenance Behaviors: Annual physical exam Healing Pattern: Average Health Facilitated by: Rest  Neurological Neurological Review of Symptoms: No symptoms reported Neurological Self-Management Outcome: 4 (good)  HEENT HEENT Symptoms Reported: Not assessed      Cardiovascular Cardiovascular Symptoms Reported: No symptoms reported Does patient have uncontrolled Hypertension?: No Cardiovascular Management Strategies: Medication therapy, Routine screening Weight: 223 lb (101.2 kg) (patient reported)  Respiratory Respiratory Symptoms Reported: No symptoms reported    Endocrine Endocrine Symptoms Reported: No symptoms reported Is patient diabetic?: Yes Is patient checking blood sugars at home?: No    Gastrointestinal Gastrointestinal Symptoms Reported: No symptoms reported       Genitourinary Genitourinary Symptoms Reported: No symptoms reported    Integumentary Integumentary Symptoms Reported: No symptoms reported    Musculoskeletal Musculoskelatal Symptoms Reviewed: Back pain, Difficulty walking   Falls in the past year?: No Number of falls in past year: 1 or less Was there an injury with Fall?: No Fall Risk Category Calculator: 0 Patient Fall Risk Level: Low Fall Risk Patient at Risk for Falls Due to: Impaired mobility Fall risk Follow up: Falls evaluation completed  Psychosocial Psychosocial Symptoms Reported: No symptoms reported Behavioral Management Strategies: Coping strategies Behavioral Health Self-Management Outcome: 4 (good) Major Change/Loss/Stressor/Fears (CP): Denies Quality of Family Relationships: helpful, involved, supportive Do you feel physically threatened by others?: No    02/06/2024    PHQ2-9 Depression Screening   Little interest or pleasure in doing things Not at all  Feeling down, depressed, or hopeless Not at all  PHQ-2 - Total Score 0  Trouble falling or staying asleep, or sleeping too much    Feeling tired or having little energy    Poor appetite or overeating     Feeling bad about yourself - or that you are a failure or have let yourself or your family down    Trouble concentrating on things, such as reading the newspaper or watching television    Moving or speaking so slowly that other people could have noticed.  Or the opposite - being so fidgety or restless that you have been moving around a lot more than usual    Thoughts that you would be better off dead, or hurting yourself in some way    PHQ2-9 Total Score    If you checked  off any problems, how difficult have these problems made it for you to do your work, take care of things at home, or get along with other people    Depression Interventions/Treatment      There were no vitals filed for this visit.  Medications Reviewed Today     Reviewed by Bertrum Rosina HERO,  RN (Registered Nurse) on 02/06/24 at 1030  Med List Status: <None>   Medication Order Taking? Sig Documenting Provider Last Dose Status Informant  amLODipine  (NORVASC ) 5 MG tablet [id number] Yes Take 5 mg by mouth daily. [provider]  Active Self  apixaban  (ELIQUIS ) 5 MG TABS tablet [id number] Yes Take 1 tablet (5 mg total) by mouth 2 (two) times daily. Golda Claudis PENNER, MD  Active Self  atorvastatin  (LIPITOR) 40 MG tablet [id number] Yes Take 40 mg by mouth daily. [provider]  Active Self           Med Note LUCIAN, LAVAUN JINNY [place] Nov 05, 2023  2:53 PM) Taking 80mg  daily  clopidogrel (PLAVIX) 75 MG tablet [id number]  Take 75 mg by mouth.  Patient not taking: Reported on 02/06/2024   [provider]  Consider Medication Status and Discontinue   cyanocobalamin  (VITAMIN B12) 1000 MCG tablet [id number] Yes Take 1 tablet (1,000 mcg total) by mouth daily. Thayil, Irene T, PA-C  Active   empagliflozin (JARDIANCE) 10 MG TABS tablet [id number] Yes Take 1 tablet by mouth daily. [provider]  Active   ezetimibe (ZETIA) 10 MG tablet [id number] Yes Take 1 tablet by mouth daily. [provider]  Active   ferrous sulfate  325 (65 FE) MG EC tablet [id number] Yes Take 1 tablet (325 mg total) by mouth daily with breakfast. Neomi Lis T, PA-C  Active   folic acid  (FOLVITE ) 1 MG tablet [id number] Yes Take 1 tablet (1 mg total) by mouth daily. Thayil, Irene T, PA-C  Active   hydrALAZINE  (APRESOLINE ) 25 MG tablet [id number] Yes Take 25 mg by mouth 2 (two) times daily. [provider]  Active Self           Med Note GARNET, ROSINA HERO   Fri Feb 06, 2024 10:16 AM)    insulin  degludec (TRESIBA  FLEXTOUCH) 100 UNIT/ML SOPN FlexTouch Pen [id number] Yes Inject 0.5 mLs (50 Units total) into the skin daily at 10 pm. Nida, Gebreselassie W, MD  Active            Med Note LUCIAN, DIONNE J   Thu Nov 13, 2023 11:17 AM) Taking 50 units daily  Insulin  Pen Needle (B-D ULTRAFINE  III SHORT PEN) 31G X 8 MM MISC [id number] Yes 1 each by Does not apply route as directed. Nida, Gebreselassie W, MD  Active Self  lenalidomide  (REVLIMID ) 10 MG capsule [id number] Yes Take 1 capsule (10 mg total) by mouth daily. Take 1 capsule (10 mg total)  by mouth daily for 21 days. Take 7 days off and then repeat. Onesimo Emaline Brink, MD  Active   lipase/protease/amylase (CREON ) 36000 UNITS CPEP capsule [id number] Yes Take 1 capsule (36,000 Units total) by mouth 3 (three) times daily with meals. Onesimo Emaline Brink, MD  Active   metFORMIN  (GLUCOPHAGE ) 1000 MG tablet [id number] Yes Take 1,000 mg by mouth 2 (two) times daily.   Patient taking differently: Take 1,000 mg by mouth daily.   [provider]  Active Self           Med Note GARNET, ROSINA HERO   Fri Feb 06, 2024 10:17 AM)    metoprolol  succinate (TOPROL -XL) 50 MG 24 hr tablet [id number] Yes Take 50 mg by mouth daily. Take with or immediately following a meal. [provider]  Active Self  Omega-3 Fatty Acids (FISH OIL) 1000 MG CPDR [id number] Yes Take 1,000 mg by mouth daily. Omega 3 300 mg [provider]  Active Self  ondansetron  (ZOFRAN ) 8 MG tablet [id number]  Take 1 tablet (8 mg total) by mouth every 8 (eight) hours as needed for nausea or vomiting.  Patient not taking: Reported on 02/06/2024   Onesimo Emaline Brink, MD  Active   potassium chloride  (MICRO-K ) 10 MEQ CR capsule [id number] Yes Take 10 mEq by mouth daily. [provider]  Active Self  Vitamin D , Ergocalciferol , (DRISDOL ) 50000 units CAPS capsule [id number] Yes Take 50,000 Units by mouth every Monday.  [provider]  Active Self  Zoledronic  Acid (ZOMETA  IV) [id number] Yes Inject 4 mg into the vein every 3 (three) months. Infusion [provider]  Active   Med List Note Carmella Asberry [facility] 02/26/23 1511): Revlimid  filled through [facility]            Recommendation:   Continue Current Plan of  Care  Follow Up Plan:   Telephone follow-up in 1 month  Rosina Forte, BSN RN [facility], [facility] RN Care Manager Direct Dial: [phone]  Fax: [phone]

## 2024-02-11 ENCOUNTER — Inpatient Hospital Stay: Payer: Medicare HMO | Admitting: Hematology

## 2024-02-11 ENCOUNTER — Inpatient Hospital Stay: Payer: Medicare HMO

## 2024-02-19 ENCOUNTER — Other Ambulatory Visit: Payer: Self-pay

## 2024-02-19 DIAGNOSIS — C9001 Multiple myeloma in remission: Secondary | ICD-10-CM

## 2024-02-20 ENCOUNTER — Inpatient Hospital Stay: Attending: Hematology | Admitting: Hematology

## 2024-02-20 ENCOUNTER — Other Ambulatory Visit

## 2024-02-20 ENCOUNTER — Inpatient Hospital Stay

## 2024-02-20 VITALS — BP 136/97 | HR 88 | Temp 97.3°F | Resp 20 | Wt 216.0 lb

## 2024-02-20 DIAGNOSIS — D649 Anemia, unspecified: Secondary | ICD-10-CM | POA: Insufficient documentation

## 2024-02-20 DIAGNOSIS — C9002 Multiple myeloma in relapse: Secondary | ICD-10-CM | POA: Diagnosis not present

## 2024-02-20 DIAGNOSIS — Z79899 Other long term (current) drug therapy: Secondary | ICD-10-CM | POA: Diagnosis not present

## 2024-02-20 DIAGNOSIS — C9001 Multiple myeloma in remission: Secondary | ICD-10-CM

## 2024-02-20 DIAGNOSIS — C9000 Multiple myeloma not having achieved remission: Secondary | ICD-10-CM | POA: Diagnosis not present

## 2024-02-20 DIAGNOSIS — Z95828 Presence of other vascular implants and grafts: Secondary | ICD-10-CM

## 2024-02-20 LAB — CBC WITH DIFFERENTIAL (CANCER CENTER ONLY)
Abs Immature Granulocytes: 0.01 K/uL (ref 0.00–0.07)
Basophils Absolute: 0.1 K/uL (ref 0.0–0.1)
Basophils Relative: 1 %
Eosinophils Absolute: 0.1 K/uL (ref 0.0–0.5)
Eosinophils Relative: 2 %
HCT: 32.2 % — ABNORMAL LOW (ref 36.0–46.0)
Hemoglobin: 10.4 g/dL — ABNORMAL LOW (ref 12.0–15.0)
Immature Granulocytes: 0 %
Lymphocytes Relative: 25 %
Lymphs Abs: 1.4 K/uL (ref 0.7–4.0)
MCH: 30.9 pg (ref 26.0–34.0)
MCHC: 32.3 g/dL (ref 30.0–36.0)
MCV: 95.5 fL (ref 80.0–100.0)
Monocytes Absolute: 0.5 K/uL (ref 0.1–1.0)
Monocytes Relative: 8 %
Neutro Abs: 3.6 K/uL (ref 1.7–7.7)
Neutrophils Relative %: 64 %
Platelet Count: 251 K/uL (ref 150–400)
RBC: 3.37 MIL/uL — ABNORMAL LOW (ref 3.87–5.11)
RDW: 20.2 % — ABNORMAL HIGH (ref 11.5–15.5)
WBC Count: 5.7 K/uL (ref 4.0–10.5)
nRBC: 0 % (ref 0.0–0.2)

## 2024-02-20 LAB — CMP (CANCER CENTER ONLY)
ALT: 27 U/L (ref 0–44)
AST: 23 U/L (ref 15–41)
Albumin: 4.2 g/dL (ref 3.5–5.0)
Alkaline Phosphatase: 87 U/L (ref 38–126)
Anion gap: 6 (ref 5–15)
BUN: 16 mg/dL (ref 8–23)
CO2: 23 mmol/L (ref 22–32)
Calcium: 9.3 mg/dL (ref 8.9–10.3)
Chloride: 113 mmol/L — ABNORMAL HIGH (ref 98–111)
Creatinine: 1.29 mg/dL — ABNORMAL HIGH (ref 0.44–1.00)
GFR, Estimated: 46 mL/min — ABNORMAL LOW (ref >60)
Glucose, Bld: 116 mg/dL — ABNORMAL HIGH (ref 70–99)
Potassium: 3.9 mmol/L (ref 3.5–5.1)
Sodium: 142 mmol/L (ref 135–145)
Total Bilirubin: 0.3 mg/dL (ref 0.0–1.2)
Total Protein: 7.3 g/dL (ref 6.5–8.1)

## 2024-02-20 LAB — SAMPLE TO BLOOD BANK

## 2024-02-20 LAB — IRON AND IRON BINDING CAPACITY (CC-WL,HP ONLY)
Iron: 63 ug/dL (ref 28–170)
Saturation Ratios: 21 % (ref 10.4–31.8)
TIBC: 304 ug/dL (ref 250–450)
UIBC: 241 ug/dL (ref 148–442)

## 2024-02-20 LAB — FERRITIN: Ferritin: 155 ng/mL (ref 11–307)

## 2024-02-20 MED ORDER — SODIUM CHLORIDE 0.9 % IV SOLN: INTRAVENOUS | Status: DC

## 2024-02-20 MED ORDER — ACETAMINOPHEN 325 MG PO TABS
650.0000 mg | ORAL_TABLET | Freq: Once | ORAL | Status: AC
  Administered 2024-02-20: 650 mg via ORAL
  Filled 2024-02-20: qty 2

## 2024-02-20 MED ORDER — ZOLEDRONIC ACID 4 MG/100ML IV SOLN
4.0000 mg | Freq: Once | INTRAVENOUS | Status: AC
Start: 2024-02-20 — End: 2024-02-20
  Administered 2024-02-20: 4 mg via INTRAVENOUS
  Filled 2024-02-20: qty 100

## 2024-02-20 MED ORDER — LORATADINE 10 MG PO TABS: 10.0000 mg | ORAL_TABLET | Freq: Once | ORAL | Status: DC

## 2024-02-23 LAB — KAPPA/LAMBDA LIGHT CHAINS
Kappa free light chain: 69.3 mg/L — ABNORMAL HIGH (ref 3.3–19.4)
Kappa, lambda light chain ratio: 2.84 — ABNORMAL HIGH (ref 0.26–1.65)
Lambda free light chains: 24.4 mg/L (ref 5.7–26.3)

## 2024-02-25 LAB — MULTIPLE MYELOMA PANEL, SERUM
Albumin SerPl Elph-Mcnc: 3.7 g/dL (ref 2.9–4.4)
Albumin/Glob SerPl: 1.2 (ref 0.7–1.7)
Alpha 1: 0.2 g/dL (ref 0.0–0.4)
Alpha2 Glob SerPl Elph-Mcnc: 0.8 g/dL (ref 0.4–1.0)
B-Globulin SerPl Elph-Mcnc: 0.9 g/dL (ref 0.7–1.3)
Gamma Glob SerPl Elph-Mcnc: 1.3 g/dL (ref 0.4–1.8)
Globulin, Total: 3.2 g/dL (ref 2.2–3.9)
IgA: 217 mg/dL (ref 87–352)
IgG (Immunoglobin G), Serum: 1341 mg/dL (ref 586–1602)
IgM (Immunoglobulin M), Srm: 40 mg/dL (ref 26–217)
M Protein SerPl Elph-Mcnc: 0.5 g/dL — ABNORMAL HIGH
Total Protein ELP: 6.9 g/dL (ref 6.0–8.5)

## 2024-02-29 ENCOUNTER — Encounter: Payer: Self-pay | Admitting: Hematology

## 2024-02-29 NOTE — Progress Notes (Signed)
 HEMATOLOGY/ONCOLOGY CLINIC VISIT NOTE  Date of Service: .02/20/2024  Patient Care Team: [name] Yancey LABOR, MD as PCP - General (Internal Medicine) Tobie Tonita POUR, DO as Consulting Physician (Neurology) Charlsie Josette SAILOR, RN as VBCI Care Management  CHIEF COMPLAINTS/PURPOSE OF CONSULTATION:   Follow-up for continued evaluation and management of multiple myeloma  Oncologic History:  68 y.o. female who initially presented with significant weight loss in the context of H. pylori-associated gastritis and alcohol abuse. At the same time, an incidental discovery of a lower thoracic vertebral mass in the context of chronic numbness and weakness in the lower extremities. MRI of the thoracic spine demonstrated a destructive lesion at T8 level and patient underwent surgical treatment. 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 Staging evaluation conducted by our clinic confirmed presence of IgG kappa multiple myeloma. Cytogenetics studies demonstrated normal karyotype, FISH studies are positive for 13q34 & D13S319 loss and no loss of p53. Based on initial information, patient was staged at [facility].    Patient has completed 6 cycles of induction systemic chemotherapy with lenalidomide , bortezomib , and low-dose dexamethasone .  Repeat bone marrow biopsy demonstrated excellent response to treatment and patient subsequently underwent consolidation therapy including melphalan conditioning and autologous stem cell rescue.  Repeat assessment with PET/CT and bone marrow biopsy on 10/13/2017 demonstrates stringent complete response maintenance.  HISTORY OF PRESENTING ILLNESS:  Please see previous note for details on initial presentation  INTERVAL HISTORY:   Christine Garrett is a 68 y.o. female who is here for continued evaluation and management of multiple myeloma. She notes no further chest pain or shortness of breath. Tolerated her IV iron  okay. Notes improvement in energy  levels. Patient notes no evidence of GI bleeding.  She is off the aspirin  and continues to be on Plavix plus Eliquis . Continues to stay off the Revlimid  due to her recent cardiac event and anemia. No focal new bone pains.  MEDICAL HISTORY:  Past Medical History:  Diagnosis Date   Arthritis    knees   Cancer (HCC)    Carpal tunnel syndrome    CHF (congestive heart failure) (HCC)    Diabetes mellitus    type 2   GERD (gastroesophageal reflux disease)    Heart murmur    Little, No concerns per Dr  Jodine pt reported.   Hypertension    controlled using a guided approch with plasma renin activity   Multiple myeloma not having achieved remission (HCC) 01/06/2017   Neuropathy    Peripheral vascular disease (HCC)    Sleep apnea    01/03/2020: per patient, hasn't used CPAP machine in two years due to inability to breathe well with it on    SURGICAL HISTORY: Past Surgical History:  Procedure Laterality Date   BIOPSY  11/28/2016   Procedure: BIOPSY;  Surgeon: Golda Claudis PENNER, MD;  [facility];  Service: Endoscopy;;  gastric   BIOPSY  07/02/2019   Procedure: BIOPSY;  Surgeon: Golda Claudis PENNER, MD;  [facility];  Service: Endoscopy;;   BTL     1982   CATARACT EXTRACTION W/PHACO Left 03/10/2023   Procedure: CATARACT EXTRACTION PHACO AND INTRAOCULAR LENS PLACEMENT (IOC);  Surgeon: Harrie Agent, MD;  Location: [facility];  Service: Ophthalmology;  Laterality: Left;  CDE: 9.43   CERVICAL CONE BIOPSY     cervical lesion   COLONOSCOPY WITH PROPOFOL  N/A 07/02/2019   Procedure: COLONOSCOPY WITH PROPOFOL ;  Surgeon: Golda Claudis PENNER, MD;  [facility];  Service: Endoscopy;  Laterality: N/A;  12:40 - pt can't come earlier due to transportation   ESOPHAGOGASTRODUODENOSCOPY (EGD) WITH PROPOFOL  N/A 11/28/2016   Procedure: ESOPHAGOGASTRODUODENOSCOPY (EGD) WITH PROPOFOL ;  Surgeon: Golda Claudis PENNER, MD;  [facility];  Service: Endoscopy;  Laterality: N/A;  9:25    IR FLUORO GUIDE PORT INSERTION RIGHT  01/29/2017   IR US  GUIDE VASC ACCESS RIGHT  01/29/2017   lipoma removal     right shoulder 2001   LUMBAR FUSION  01/05/2020    Lumbar Two-Three Lumbar Three-Four Lumbar Four-Five Posterior lumbar interbody fusion with decompression (N/A Spine Lumbar)   MULTIPLE EXTRACTIONS WITH ALVEOLOPLASTY Bilateral 08/24/2012   Procedure: MULTIPLE EXTRACTION WITH ALVEOLOPLASTY BIOPSY OF RIGHT AND LEFT MANDIBLE ;  Surgeon: Glendia CHRISTELLA Primrose, DDS;  Location: [facility];  Service: Oral Surgery;  Laterality: Bilateral;   POLYPECTOMY  07/02/2019   Procedure: POLYPECTOMY;  Surgeon: Golda Claudis PENNER, MD;  [facility];  Service: Endoscopy;;   POSTERIOR LUMBAR FUSION 4 LEVEL N/A 12/26/2016   Procedure: THORACIC EIGHT TUMOR RESECTION, THORACIC SIX- THORACIC TEN POSTERIOR SPINAL FUSION;  Surgeon: Gillie Duncans, MD;  Location: [facility];  Service: Neurosurgery;  Laterality: N/A;  THORACIC 8 TUMOR RESECTION, THORACIC 6- THORACIC 10 POSTERIOR SPINAL FUSION   ROTATOR CUFF REPAIR     right shoulder   TUBAL LIGATION      SOCIAL HISTORY: Social History   Socioeconomic History   Marital status: Single    Spouse name: Not on file   Number of children: 3   Years of education: Not on file   Highest education level: Not on file  Occupational History   Occupation: on disability   Occupation: former CNA  Tobacco Use   Smoking status: Former    Current packs/day: 0.00    Average packs/day: 0.5 packs/day for 6.0 years (3.0 ttl pk-yrs)    Types: Cigarettes    Start date: 2013    Quit date: 2019    Years since quitting: 6.7   Smokeless tobacco: Never  Vaping Use   Vaping status: Never Used  Substance and Sexual Activity   Alcohol use: Yes    Alcohol/week: 6.0 standard drinks of alcohol    Types: 6 Cans of beer per week   Drug use: No   Sexual activity: Not Currently    Birth control/protection: Post-menopausal  Other Topics Concern   Not on file  Social History Narrative    Lives with son, daughter and grandkids in a 2 story home but stays on the first floor.  Has 3 children.  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      Right handed   Social Drivers of Health   Financial Resource Strain: Low Risk  (10/30/2023)   Received from [facility]   Overall Financial Resource Strain (CARDIA)    Difficulty of Paying Living Expenses: Not hard at all  Food Insecurity: No Food Insecurity (02/06/2024)   Hunger Vital Sign    Worried About Running Out of Food in the Last Year: Never true    Ran Out of Food in the Last Year: Never true  Transportation Needs: No Transportation Needs (02/06/2024)   PRAPARE - Administrator, Civil Service (Medical): No    Lack of Transportation (Non-Medical): No  Physical Activity: Inactive (10/31/2023)   Received from [facility]   Exercise Vital Sign    On average, how many days per week do  you engage in moderate to strenuous exercise (like a brisk walk)?: 0 days    On average, how many minutes do you engage in exercise at this level?: 0 min  Stress: Not on file  Social Connections: Socially Isolated (10/31/2023)   Received from [facility]   Social Connection and Isolation Panel    In a typical week, how many times do you talk on the phone with family, friends, or neighbors?: Three times a week    How often do you get together with friends or relatives?: Once a week    How often do you attend church or religious services?: Never    Do you belong to any clubs or organizations such as church groups, unions, fraternal or athletic groups, or school groups?: No    How often do you attend meetings of the clubs or organizations you belong to?: Never    Are you married, widowed, divorced, separated, never married, or living with a partner?: Widowed  Intimate Partner Violence: Not At Risk (02/06/2024)   Humiliation, Afraid, Rape, and Kick questionnaire    Fear of Current or Ex-Partner: No     Emotionally Abused: No    Physically Abused: No    Sexually Abused: No    FAMILY HISTORY: Family History  Problem Relation Age of Onset   Diabetes Mother    Hypertension Mother    Heart disease Mother    Alzheimer's disease Mother    Diabetes Father    Heart disease Sister    Diabetes Sister     ALLERGIES:  is allergic to hydrocodone.  MEDICATIONS:  Current Outpatient Medications  Medication Sig Dispense Refill   amLODipine  (NORVASC ) 5 MG tablet Take 5 mg by mouth daily.     apixaban  (ELIQUIS ) 5 MG TABS tablet Take 1 tablet (5 mg total) by mouth 2 (two) times daily.     atorvastatin  (LIPITOR) 40 MG tablet Take 40 mg by mouth daily.     clopidogrel (PLAVIX) 75 MG tablet Take 75 mg by mouth.     cyanocobalamin  (VITAMIN B12) 1000 MCG tablet Take 1 tablet (1,000 mcg total) by mouth daily. 30 tablet 3   empagliflozin (JARDIANCE) 10 MG TABS tablet Take 1 tablet by mouth daily.     ezetimibe (ZETIA) 10 MG tablet Take 1 tablet by mouth daily.     ferrous sulfate  325 (65 FE) MG EC tablet Take 1 tablet (325 mg total) by mouth daily with breakfast. 30 tablet 3   folic acid  (FOLVITE ) 1 MG tablet Take 1 tablet (1 mg total) by mouth daily. 30 tablet 2   hydrALAZINE  (APRESOLINE ) 25 MG tablet Take 25 mg by mouth 2 (two) times daily.     insulin  degludec (TRESIBA  FLEXTOUCH) 100 UNIT/ML SOPN FlexTouch Pen Inject 0.5 mLs (50 Units total) into the skin daily at 10 pm. 5 pen 2   Insulin  Pen Needle (B-D ULTRAFINE III SHORT PEN) 31G X 8 MM MISC 1 each by Does not apply route as directed. 100 each 3   lenalidomide  (REVLIMID ) 10 MG capsule Take 1 capsule (10 mg total) by mouth daily. Take 1 capsule (10 mg total)  by mouth daily for 21 days. Take 7 days off and then repeat. 21 capsule 0   lipase/protease/amylase (CREON ) 36000 UNITS CPEP capsule Take 1 capsule (36,000 Units total) by mouth 3 (three) times daily with meals. 180 capsule 2   metFORMIN  (GLUCOPHAGE ) 1000 MG tablet Take 1,000 mg by mouth 2 (two)  times daily.  (Patient  taking differently: Take 1,000 mg by mouth daily.)     metoprolol  succinate (TOPROL -XL) 50 MG 24 hr tablet Take 50 mg by mouth daily. Take with or immediately following a meal.     Omega-3 Fatty Acids (FISH OIL) 1000 MG CPDR Take 1,000 mg by mouth daily. Omega 3 300 mg     potassium chloride  (MICRO-K ) 10 MEQ CR capsule Take 10 mEq by mouth daily.     Vitamin D , Ergocalciferol , (DRISDOL ) 50000 units CAPS capsule Take 50,000 Units by mouth every Monday.      Zoledronic  Acid (ZOMETA  IV) Inject 4 mg into the vein every 3 (three) months. Infusion     ondansetron  (ZOFRAN ) 8 MG tablet Take 1 tablet (8 mg total) by mouth every 8 (eight) hours as needed for nausea or vomiting. (Patient not taking: Reported on 02/20/2024) 30 tablet 0   No current facility-administered medications for this visit.    REVIEW OF SYSTEMS:    .10 Point review of Systems was done is negative except as noted above.  PHYSICAL EXAMINATION:  .BP (!) 136/97   Pulse 88   Temp (!) 97.3 F (36.3 C)   Resp 20   Wt 216 lb (98 kg)   SpO2 100%   BMI 31.90 kg/m  . GENERAL:alert, in no acute distress and comfortable SKIN: no acute rashes, no significant lesions EYES: conjunctiva are pink and non-injected, sclera anicteric OROPHARYNX: MMM, no exudates, no oropharyngeal erythema or ulceration NECK: supple, no JVD LYMPH:  no palpable lymphadenopathy in the cervical, axillary or inguinal regions LUNGS: clear to auscultation b/l with normal respiratory effort HEART: regular rate & rhythm ABDOMEN:  normoactive bowel sounds , non tender, not distended. Extremity: no pedal edema PSYCH: alert & oriented x 3 with fluent speech NEURO: no focal motor/sensory deficits  LABORATORY DATA:  I have reviewed the data as listed      Latest Ref Rng & Units 02/20/2024   10:07 AM 01/16/2024   12:57 PM 12/31/2023    2:32 PM  CBC  WBC 4.0 - 10.5 K/uL 5.7  5.4  6.6   Hemoglobin 12.0 - 15.0 g/dL 89.5  8.3  7.4   Hematocrit  36.0 - 46.0 % 32.2  26.8  24.0   Platelets 150 - 400 K/uL 251  255  376     .    Latest Ref Rng & Units 02/20/2024   10:07 AM 01/16/2024   12:57 PM 12/31/2023    2:32 PM  CMP  Glucose 70 - 99 mg/dL 883  883  865   BUN 8 - 23 mg/dL 16  11  9    Creatinine 0.44 - 1.00 mg/dL 8.70  8.87  8.77   Sodium 135 - 145 mmol/L 142  139  139   Potassium 3.5 - 5.1 mmol/L 3.9  3.9  3.9   Chloride 98 - 111 mmol/L 113  111  110   CO2 22 - 32 mmol/L 23  22  23    Calcium  8.9 - 10.3 mg/dL 9.3  8.7  9.0   Total Protein 6.5 - 8.1 g/dL 7.3  7.1  7.1   Total Bilirubin 0.0 - 1.2 mg/dL 0.3  0.3  0.3   Alkaline Phos 38 - 126 U/L 87  82  101   AST 15 - 41 U/L 23  28  22    ALT 0 - 44 U/L 27  19  21      Bone marrow biopsy 02/18/2023:    03/25/18 SPEP and SFLC:  04/21/17 Cytogenetics:    01/17/17 Cytogenetics:    RADIOGRAPHIC STUDIES: I have personally reviewed the radiological images as listed and agreed with the findings in the report.  No results found.   10/13/17 PET/CT     ASSESSMENT & PLAN:   68 y.o. female with  1. Multiple myeloma Autologous PBSCT on 07/01/17. IgG kappa multiple myeloma. Cytogenetics studies demonstrated normal karyotype, FISH studies are positive for 13q34 & D13S319 loss and no loss of p53. Based on initial information, patient was staged at [facility].  Completed 6 cycles of induction with Revlimid , Velcade  and dexamethasone   12/09/2018 lumbar spine xray with results revealing No acute osseous abnormality. Extensive multilevel degenerative changes are noted throughout the lumbar spine.  12/09/2018 sacrum and coccyx xray with results revealing No acute osseous abnormality. Degenerative changes are noted of both hips.  2. Grade 2 neuropathy -controlled  02/26/18 NCV with EMG which revealed The electrophysiologic findings are most consistent with a chronic, length-dependent sensorimotor axonal and demyelinating polyneuropathy affecting the right side. Overall, these  findings are moderate to severe in degree electrically. Incidentally, there is a right Martin-Gruber anastomosis, a normal variant.   3. Rt LE calf veins - possible clot ?chronic.  01/29/18 VAS US  BLE which revealed Right: Ultrasound is unable to distinguish whether obstruction in the Peroneal veins, and great saphenous vein, and superficial veins/varciosities is acute or chronic. No cystic structure found in the popliteal fossa. Left: no evidence of DVT.   4 CAD recent PCI PLAN: Patient notes no acute new symptoms since her last clinic visit. No overt GI bleeding issues.  Continues to be on Plavix plus Eliquis  and is on aspirin . CBC today shows improvement in her anemia with a hemoglobin of 10.4 up from a previous hemoglobin of 7.4.  WBC count is within normal limits at 5.7 and platelets are 251k CMP stable creatinine of 1.29 Ferritin is improved to 155 with iron  saturation of 21% Myeloma panel shows a gradual bump in her M protein to 0.5 g/dL Elevated kappa light chains of 69 with a kappa lambda ratio of 2.84. Will need to hold off on Revlimid  since this is likely not a good choice for her given her cytopenias and history of previous DVT as well as recent coronary disease requiring PCI. Will monitor labs in 6 weeks and discussed with patient's next treatment options.  FOLLOW-UP: Continue Zometa  every 12 weeks Labs in 6 weeks Phone visit with Dr Onesimo in 7 weeks   .The total time spent in the appointment was 30 minutes* .  All of the patient's questions were answered with apparent satisfaction. The patient knows to call the clinic with any problems, questions or concerns.   Emaline Onesimo MD MS AAHIVMS [facility] Hematology/Oncology Physician [facility]  .*Total Encounter Time as defined by the Centers for Medicare and Medicaid Services includes, in addition to the face-to-face time of a patient visit (documented in the note above) non-face-to-face time: obtaining and  reviewing outside history, ordering and reviewing medications, tests or procedures, care coordination (communications with other health care professionals or caregivers) and documentation in the medical record.

## 2024-03-04 DIAGNOSIS — J449 Chronic obstructive pulmonary disease, unspecified: Secondary | ICD-10-CM | POA: Diagnosis not present

## 2024-03-04 DIAGNOSIS — Z6832 Body mass index (BMI) 32.0-32.9, adult: Secondary | ICD-10-CM | POA: Diagnosis not present

## 2024-03-04 DIAGNOSIS — E1122 Type 2 diabetes mellitus with diabetic chronic kidney disease: Secondary | ICD-10-CM | POA: Diagnosis not present

## 2024-03-04 DIAGNOSIS — I872 Venous insufficiency (chronic) (peripheral): Secondary | ICD-10-CM | POA: Diagnosis not present

## 2024-03-04 DIAGNOSIS — I214 Non-ST elevation (NSTEMI) myocardial infarction: Secondary | ICD-10-CM | POA: Diagnosis not present

## 2024-03-04 DIAGNOSIS — I10 Essential (primary) hypertension: Secondary | ICD-10-CM | POA: Diagnosis not present

## 2024-03-04 DIAGNOSIS — I700 Atherosclerosis of aorta: Secondary | ICD-10-CM | POA: Diagnosis not present

## 2024-03-04 DIAGNOSIS — J301 Allergic rhinitis due to pollen: Secondary | ICD-10-CM | POA: Diagnosis not present

## 2024-03-04 DIAGNOSIS — I5042 Chronic combined systolic (congestive) and diastolic (congestive) heart failure: Secondary | ICD-10-CM | POA: Diagnosis not present

## 2024-03-04 DIAGNOSIS — I82509 Chronic embolism and thrombosis of unspecified deep veins of unspecified lower extremity: Secondary | ICD-10-CM | POA: Diagnosis not present

## 2024-03-04 DIAGNOSIS — E7849 Other hyperlipidemia: Secondary | ICD-10-CM | POA: Diagnosis not present

## 2024-03-04 DIAGNOSIS — N1831 Chronic kidney disease, stage 3a: Secondary | ICD-10-CM | POA: Diagnosis not present

## 2024-03-08 ENCOUNTER — Other Ambulatory Visit: Payer: Self-pay

## 2024-03-08 NOTE — Patient Instructions (Signed)
 Visit Information  Thank you for taking time to visit with me today. Please don't hesitate to contact me if I can be of assistance to you before our next scheduled appointment.  Your next care management appointment is by telephone on 04/07/2024 at 1:00 PM   Telephone follow-up in 1 month  Please call the care guide team at [phone] if you need to cancel, schedule, or reschedule an appointment.   Please call the Suicide and Crisis Lifeline: 988 call the [place]  National Suicide Prevention Lifeline: (404) 203-5379 or TTY: 403-361-7784 TTY 805-204-7434) to talk to a trained counselor call 1-800-273-TALK (toll free, 24 hour hotline) call the [facility]: 2484002442 call 911 if you are experiencing a Mental Health or Behavioral Health Crisis or need someone to talk to.  Hendricks Her RN, BSN  [place] VBCI-Population Health RN Case Information systems manager [phone]

## 2024-03-31 ENCOUNTER — Other Ambulatory Visit: Payer: Self-pay

## 2024-03-31 DIAGNOSIS — C9001 Multiple myeloma in remission: Secondary | ICD-10-CM

## 2024-04-02 ENCOUNTER — Inpatient Hospital Stay: Attending: Hematology

## 2024-04-02 DIAGNOSIS — D649 Anemia, unspecified: Secondary | ICD-10-CM | POA: Insufficient documentation

## 2024-04-02 DIAGNOSIS — C9000 Multiple myeloma not having achieved remission: Secondary | ICD-10-CM | POA: Diagnosis not present

## 2024-04-02 DIAGNOSIS — C9001 Multiple myeloma in remission: Secondary | ICD-10-CM

## 2024-04-02 LAB — CMP (CANCER CENTER ONLY)
ALT: 30 U/L (ref 0–44)
AST: 29 U/L (ref 15–41)
Albumin: 3.9 g/dL (ref 3.5–5.0)
Alkaline Phosphatase: 82 U/L (ref 38–126)
Anion gap: 4 — ABNORMAL LOW (ref 5–15)
BUN: 12 mg/dL (ref 8–23)
CO2: 25 mmol/L (ref 22–32)
Calcium: 9.5 mg/dL (ref 8.9–10.3)
Chloride: 112 mmol/L — ABNORMAL HIGH (ref 98–111)
Creatinine: 1.29 mg/dL — ABNORMAL HIGH (ref 0.44–1.00)
GFR, Estimated: 46 mL/min — ABNORMAL LOW (ref >60)
Glucose, Bld: 118 mg/dL — ABNORMAL HIGH (ref 70–99)
Potassium: 4.1 mmol/L (ref 3.5–5.1)
Sodium: 141 mmol/L (ref 135–145)
Total Bilirubin: 0.3 mg/dL (ref 0.0–1.2)
Total Protein: 7.2 g/dL (ref 6.5–8.1)

## 2024-04-02 LAB — CBC WITH DIFFERENTIAL (CANCER CENTER ONLY)
Abs Immature Granulocytes: 0.02 K/uL (ref 0.00–0.07)
Basophils Absolute: 0.1 K/uL (ref 0.0–0.1)
Basophils Relative: 1 %
Eosinophils Absolute: 0.1 K/uL (ref 0.0–0.5)
Eosinophils Relative: 2 %
HCT: 31 % — ABNORMAL LOW (ref 36.0–46.0)
Hemoglobin: 10.3 g/dL — ABNORMAL LOW (ref 12.0–15.0)
Immature Granulocytes: 0 %
Lymphocytes Relative: 29 %
Lymphs Abs: 1.8 K/uL (ref 0.7–4.0)
MCH: 32.7 pg (ref 26.0–34.0)
MCHC: 33.2 g/dL (ref 30.0–36.0)
MCV: 98.4 fL (ref 80.0–100.0)
Monocytes Absolute: 0.4 K/uL (ref 0.1–1.0)
Monocytes Relative: 7 %
Neutro Abs: 3.9 K/uL (ref 1.7–7.7)
Neutrophils Relative %: 61 %
Platelet Count: 268 K/uL (ref 150–400)
RBC: 3.15 MIL/uL — ABNORMAL LOW (ref 3.87–5.11)
RDW: 15.9 % — ABNORMAL HIGH (ref 11.5–15.5)
WBC Count: 6.3 K/uL (ref 4.0–10.5)
nRBC: 0 % (ref 0.0–0.2)

## 2024-04-02 LAB — FERRITIN: Ferritin: 65 ng/mL (ref 11–307)

## 2024-04-02 LAB — IRON AND IRON BINDING CAPACITY (CC-WL,HP ONLY)
Iron: 56 ug/dL (ref 28–170)
Saturation Ratios: 18 % (ref 10.4–31.8)
TIBC: 304 ug/dL (ref 250–450)
UIBC: 248 ug/dL (ref 148–442)

## 2024-04-05 LAB — KAPPA/LAMBDA LIGHT CHAINS
Kappa free light chain: 86.3 mg/L — ABNORMAL HIGH (ref 3.3–19.4)
Kappa, lambda light chain ratio: 3.69 — ABNORMAL HIGH (ref 0.26–1.65)
Lambda free light chains: 23.4 mg/L (ref 5.7–26.3)

## 2024-04-07 ENCOUNTER — Telehealth: Payer: Self-pay

## 2024-04-08 LAB — MULTIPLE MYELOMA PANEL, SERUM
Albumin SerPl Elph-Mcnc: 3.4 g/dL (ref 2.9–4.4)
Albumin/Glob SerPl: 1.1 (ref 0.7–1.7)
Alpha 1: 0.2 g/dL (ref 0.0–0.4)
Alpha2 Glob SerPl Elph-Mcnc: 0.8 g/dL (ref 0.4–1.0)
B-Globulin SerPl Elph-Mcnc: 0.8 g/dL (ref 0.7–1.3)
Gamma Glob SerPl Elph-Mcnc: 1.3 g/dL (ref 0.4–1.8)
Globulin, Total: 3.1 g/dL (ref 2.2–3.9)
IgA: 213 mg/dL (ref 87–352)
IgG (Immunoglobin G), Serum: 1385 mg/dL (ref 586–1602)
IgM (Immunoglobulin M), Srm: 40 mg/dL (ref 26–217)
M Protein SerPl Elph-Mcnc: 0.4 g/dL — ABNORMAL HIGH
Total Protein ELP: 6.5 g/dL (ref 6.0–8.5)

## 2024-04-09 ENCOUNTER — Inpatient Hospital Stay: Admitting: Hematology

## 2024-04-09 NOTE — Progress Notes (Signed)
 Patient could not be reached for this phone visit and will be rescheduled to discuss her recent labs which appear stable.

## 2024-04-23 ENCOUNTER — Encounter: Payer: Self-pay | Admitting: Hematology

## 2024-04-23 ENCOUNTER — Other Ambulatory Visit: Payer: Self-pay

## 2024-04-23 NOTE — Progress Notes (Signed)
 This encounter was created in error - please disregard.

## 2024-04-26 NOTE — Patient Instructions (Signed)
 Visit Information  Thank you for taking time to visit with me today. Please don't hesitate to contact me if I can be of assistance to you before our next scheduled appointment.  Your next care management appointment is by telephone on Friday, November 14th at 10:00am to follow up on left hip pain.    Please call the care guide team at [phone] if you need to cancel, schedule, or reschedule an appointment.   Please call the [place]  National Suicide Prevention Lifeline: 252-617-1884 or TTY: 812-413-2790 TTY (270)183-9476) to talk to a trained counselor if you are experiencing a Mental Health or Behavioral Health Crisis or need someone to talk to.  Santana Stamp BSN, CCM [place]  VBCI Population Health RN Care Manager Direct Dial: [phone]  Fax: [phone]

## 2024-04-26 NOTE — Patient Outreach (Signed)
 Complex Care Management   Visit Note  04/26/2024  Name:  Christine Garrett MRN: [id number] DOB: January 12, 1956  Situation: Referral received for Complex Care Management related to Health Maintenance. I obtained verbal consent from Patient.  Visit completed with [name]  on the phone. Today's call focused on left hip pain, rating at 9/10, flared up two days ago.    Background:   Past Medical History:  Diagnosis Date   Arthritis    knees   Cancer (HCC)    Carpal tunnel syndrome    CHF (congestive heart failure) (HCC)    Diabetes mellitus    type 2   GERD (gastroesophageal reflux disease)    Heart murmur    Little, No concerns per Dr  Jodine pt reported.   Hypertension    controlled using a guided approch with plasma renin activity   Multiple myeloma not having achieved remission (HCC) 01/06/2017   Neuropathy    Peripheral vascular disease    Sleep apnea    01/03/2020: per patient, hasn't used CPAP machine in two years due to inability to breathe well with it on    Assessment: Patient Reported Symptoms:  Cognitive Cognitive Status: No symptoms reported, Alert and oriented to person, place, and time, Insightful and able to interpret abstract concepts, Normal speech and language skills      Neurological Neurological Review of Symptoms: No symptoms reported    HEENT HEENT Symptoms Reported: Not assessed      Cardiovascular Cardiovascular Symptoms Reported: No symptoms reported    Respiratory Respiratory Symptoms Reported: No symptoms reported    Endocrine Endocrine Symptoms Reported: No symptoms reported Is patient diabetic?: Yes Is patient checking blood sugars at home?: Yes    Gastrointestinal Gastrointestinal Symptoms Reported: No symptoms reported      Genitourinary Genitourinary Symptoms Reported: No symptoms reported    Integumentary Integumentary Symptoms Reported: No symptoms reported    Musculoskeletal Musculoskelatal Symptoms Reviewed: Joint  pain Additional Musculoskeletal Details: Left hip pain has flared up, rates pain of 9/10, going on for two days, using Tylenol  to treat along with hot shower - this decreased the pain a little bit.  Uses cane and walker.  This RNCM warm transferred patient to PCP office for appointment to be seen for hip pain.   Falls in the past year?: No    Psychosocial Psychosocial Symptoms Reported: Not assessed          04/26/2024    PHQ2-9 Depression Screening   Little interest or pleasure in doing things    Feeling down, depressed, or hopeless    PHQ-2 - Total Score    Trouble falling or staying asleep, or sleeping too much    Feeling tired or having little energy    Poor appetite or overeating     Feeling bad about yourself - or that you are a failure or have let yourself or your family down    Trouble concentrating on things, such as reading the newspaper or watching television    Moving or speaking so slowly that other people could have noticed.  Or the opposite - being so fidgety or restless that you have been moving around a lot more than usual    Thoughts that you would be better off dead, or hurting yourself in some way    PHQ2-9 Total Score    If you checked off any problems, how difficult have these problems made it for you to do your work, take care of things at home, or  get along with other people    Depression Interventions/Treatment      There were no vitals filed for this visit.  MEDICATIONS: Using Tylenol  for pain, no other concerns with medications, no problems with getting refills.      Recommendation:   This RNCM warm transferred patient to PCP office for acute appointment to address hip pain  Follow Up Plan:   Telephone follow-up in 1 week  Santana Stamp BSN, CCM [place]  [facility] Population Health RN Care Manager Direct Dial: [phone]  Fax: [phone]

## 2024-04-27 DIAGNOSIS — Z6832 Body mass index (BMI) 32.0-32.9, adult: Secondary | ICD-10-CM | POA: Diagnosis not present

## 2024-04-27 DIAGNOSIS — M5430 Sciatica, unspecified side: Secondary | ICD-10-CM | POA: Diagnosis not present

## 2024-04-30 ENCOUNTER — Other Ambulatory Visit: Payer: Self-pay

## 2024-04-30 NOTE — Patient Outreach (Signed)
 Complex Care Management   Visit Note  04/30/2024  Name:  Christine Garrett MRN: [id number] DOB: 07/01/1955  Situation: Referral received for Complex Care Management related to Dental. I obtained verbal consent from Patient.  Visit completed with Christine Garrett  on the phone  Background:   Past Medical History:  Diagnosis Date   Arthritis    knees   Cancer (HCC)    Carpal tunnel syndrome    CHF (congestive heart failure) (HCC)    Diabetes mellitus    type 2   GERD (gastroesophageal reflux disease)    Heart murmur    Little, No concerns per Dr  Jodine pt reported.   Hypertension    controlled using a guided approch with plasma renin activity   Multiple myeloma not having achieved remission (HCC) 01/06/2017   Neuropathy    Peripheral vascular disease    Sleep apnea    01/03/2020: per patient, hasn't used CPAP machine in two years due to inability to breathe well with it on    Assessment: Patient Reported Symptoms:  Cognitive Cognitive Status: No symptoms reported      Neurological Neurological Review of Symptoms: Not assessed    HEENT HEENT Symptoms Reported: Not assessed      Cardiovascular Cardiovascular Symptoms Reported: Not assessed    Respiratory Respiratory Symptoms Reported: Not assesed    Endocrine Endocrine Symptoms Reported: Not assessed    Gastrointestinal Gastrointestinal Symptoms Reported: Not assessed      Genitourinary Genitourinary Symptoms Reported: Not assessed    Integumentary Integumentary Symptoms Reported: Not assessed    Musculoskeletal Musculoskelatal Symptoms Reviewed: Joint pain Additional Musculoskeletal Details: she was able to see Dr. [name] on 04/27/24 for hip pain, states he said the pain was actually coming from my back.  Prescribed methylprednisolone 4mg  6-day supply (21 pills), she has only been able to take one dose.        Psychosocial Psychosocial Symptoms Reported: Not assessed          04/30/2024    PHQ2-9  Depression Screening   Little interest or pleasure in doing things    Feeling down, depressed, or hopeless    PHQ-2 - Total Score    Trouble falling or staying asleep, or sleeping too much    Feeling tired or having little energy    Poor appetite or overeating     Feeling bad about yourself - or that you are a failure or have let yourself or your family down    Trouble concentrating on things, such as reading the newspaper or watching television    Moving or speaking so slowly that other people could have noticed.  Or the opposite - being so fidgety or restless that you have been moving around a lot more than usual    Thoughts that you would be better off dead, or hurting yourself in some way    PHQ2-9 Total Score    If you checked off any problems, how difficult have these problems made it for you to do your work, take care of things at home, or get along with other people    Depression Interventions/Treatment      There were no vitals filed for this visit. Pain Scale: 0-10 Pain Score: 9  Pain Type: Chronic pain Pain Location: Hip Pain Orientation: Left Pain Descriptors / Indicators: Aching Pain Onset: On-going Pain Intervention(s): Medication (See eMAR)  MEDICATIONS: discussed newly prescribed methylprednisolone, taking as prescribed, just started first dose.    Recommendation:   Continue Current  Plan of Care Follow up with Dr. [name] if back pain does not improve after taking Methyprednisolone  Follow Up Plan:   Telephone follow-up two weeks.  Santana Stamp BSN, CCM [place]  [facility] Population [facility] RN Care Manager Direct Dial: [phone]  Fax: [phone]

## 2024-05-01 NOTE — Patient Instructions (Signed)
 Visit Information  Thank you for taking time to visit with me today. Please don't hesitate to contact me if I can be of assistance to you before our next scheduled appointment.  Your next care management appointment is by telephone on Friday, December 5th at 1::00pm.   Please call the care guide team at [phone] if you need to cancel, schedule, or reschedule an appointment.   Please call the [place]  National Suicide Prevention Lifeline: 507-416-7407 or TTY: 8058376919 TTY (207)631-5974) to talk to a trained counselor if you are experiencing a Mental Health or Behavioral Health Crisis or need someone to talk to.  Santana Stamp BSN, CCM [place]  [facility] Population Health RN Care Manager Direct Dial: [phone]  Fax: [phone]

## 2024-05-01 NOTE — Patient Outreach (Signed)
 Complex Care Management   Visit Note  05/01/2024  Name:  Christine Garrett MRN: [id number] DOB: August 05, 1955  Situation: Referral received for Complex Care Management related to dental. I obtained verbal consent from Patient.  Visit completed with patient  on the phone,   Background:   Past Medical History:  Diagnosis Date   Arthritis    knees   Cancer (HCC)    Carpal tunnel syndrome    CHF (congestive heart failure) (HCC)    Diabetes mellitus    type 2   GERD (gastroesophageal reflux disease)    Heart murmur    Little, No concerns per Dr  Jodine pt reported.   Hypertension    controlled using a guided approch with plasma renin activity   Multiple myeloma not having achieved remission (HCC) 01/06/2017   Neuropathy    Peripheral vascular disease    Sleep apnea    01/03/2020: per patient, hasn't used CPAP machine in two years due to inability to breathe well with it on    Assessment: Patient Reported Symptoms:  Cognitive Cognitive Status: No symptoms reported      Neurological Neurological Review of Symptoms: Not assessed    HEENT HEENT Symptoms Reported: Not assessed      Cardiovascular Cardiovascular Symptoms Reported: Not assessed    Respiratory Respiratory Symptoms Reported: Not assesed    Endocrine Endocrine Symptoms Reported: No symptoms reported Endocrine Comment: Discussed watching blood sugars due to taking methylprednisolone, report FBG trending towards 150-180mg /dL  Gastrointestinal Gastrointestinal Symptoms Reported: Not assessed      Genitourinary Genitourinary Symptoms Reported: Not assessed    Integumentary Integumentary Symptoms Reported: Not assessed    Musculoskeletal Musculoskelatal Symptoms Reviewed: Joint pain Additional Musculoskeletal Details: she was able to see Dr. [name] on 04/27/24 for hip pain, states he said the pain was actually coming from my back.  Prescribed methylprednisolone 4mg  6-day supply (21 pills), she has only been able  to take one dose.        Psychosocial Psychosocial Symptoms Reported: Not assessed          05/01/2024    PHQ2-9 Depression Screening   Little interest or pleasure in doing things    Feeling down, depressed, or hopeless    PHQ-2 - Total Score    Trouble falling or staying asleep, or sleeping too much    Feeling tired or having little energy    Poor appetite or overeating     Feeling bad about yourself - or that you are a failure or have let yourself or your family down    Trouble concentrating on things, such as reading the newspaper or watching television    Moving or speaking so slowly that other people could have noticed.  Or the opposite - being so fidgety or restless that you have been moving around a lot more than usual    Thoughts that you would be better off dead, or hurting yourself in some way    PHQ2-9 Total Score    If you checked off any problems, how difficult have these problems made it for you to do your work, take care of things at home, or get along with other people    Depression Interventions/Treatment      There were no vitals filed for this visit. Pain Scale: 0-10 Pain Score: 9  Pain Type: Chronic pain Pain Location: Hip Pain Orientation: Left Pain Descriptors / Indicators: Aching Pain Onset: On-going Pain Intervention(s): Medication (See eMAR)  Medications Reviewed Today   Medications  were not reviewed in this encounter     Recommendation:   Continue Current Plan of Care Patient will complete doses of Methylprednisolone as ordered. Will follow up with PCP on 04/27/24 to evaluate back/hip pain.  Follow Up Plan:   Telephone follow-up two weeks.  Santana Stamp BSN, CCM [place]  [facility] Population [facility] RN Care Manager Direct Dial: [phone]  Fax: [phone]

## 2024-05-01 NOTE — Patient Outreach (Addendum)
 Complex Care Management   Visit Note  05/01/2024  Name:  Christine Garrett MRN: [id number] DOB: August 05, 1955  Situation: Referral received for Complex Care Management related to dental. I obtained verbal consent from Patient.  Visit completed with patient  on the phone,   Background:   Past Medical History:  Diagnosis Date   Arthritis    knees   Cancer (HCC)    Carpal tunnel syndrome    CHF (congestive heart failure) (HCC)    Diabetes mellitus    type 2   GERD (gastroesophageal reflux disease)    Heart murmur    Little, No concerns per Dr  Jodine pt reported.   Hypertension    controlled using a guided approch with plasma renin activity   Multiple myeloma not having achieved remission (HCC) 01/06/2017   Neuropathy    Peripheral vascular disease    Sleep apnea    01/03/2020: per patient, hasn't used CPAP machine in two years due to inability to breathe well with it on    Assessment: Patient Reported Symptoms:  Cognitive Cognitive Status: No symptoms reported      Neurological Neurological Review of Symptoms: Not assessed    HEENT HEENT Symptoms Reported: Not assessed      Cardiovascular Cardiovascular Symptoms Reported: Not assessed    Respiratory Respiratory Symptoms Reported: Not assesed    Endocrine Endocrine Symptoms Reported: No symptoms reported Endocrine Comment: Discussed watching blood sugars due to taking methylprednisolone, report FBG trending towards 150-180mg /dL  Gastrointestinal Gastrointestinal Symptoms Reported: Not assessed      Genitourinary Genitourinary Symptoms Reported: Not assessed    Integumentary Integumentary Symptoms Reported: Not assessed    Musculoskeletal Musculoskelatal Symptoms Reviewed: Joint pain Additional Musculoskeletal Details: she was able to see Dr. [name] on 04/27/24 for hip pain, states he said the pain was actually coming from my back.  Prescribed methylprednisolone 4mg  6-day supply (21 pills), she has only been able  to take one dose.        Psychosocial Psychosocial Symptoms Reported: Not assessed          05/01/2024    PHQ2-9 Depression Screening   Little interest or pleasure in doing things    Feeling down, depressed, or hopeless    PHQ-2 - Total Score    Trouble falling or staying asleep, or sleeping too much    Feeling tired or having little energy    Poor appetite or overeating     Feeling bad about yourself - or that you are a failure or have let yourself or your family down    Trouble concentrating on things, such as reading the newspaper or watching television    Moving or speaking so slowly that other people could have noticed.  Or the opposite - being so fidgety or restless that you have been moving around a lot more than usual    Thoughts that you would be better off dead, or hurting yourself in some way    PHQ2-9 Total Score    If you checked off any problems, how difficult have these problems made it for you to do your work, take care of things at home, or get along with other people    Depression Interventions/Treatment      There were no vitals filed for this visit. Pain Scale: 0-10 Pain Score: 9  Pain Type: Chronic pain Pain Location: Hip Pain Orientation: Left Pain Descriptors / Indicators: Aching Pain Onset: On-going Pain Intervention(s): Medication (See eMAR)  Medications Reviewed Today   Medications  were not reviewed in this encounter     Recommendation:   Continue Current Plan of Care Patient will complete doses of Methylprednisolone as ordered. Will follow up with PCP on 04/27/24 to evaluate back/hip pain.  Follow Up Plan:   Telephone follow-up two weeks.  Santana Stamp BSN, CCM [place]  [facility] Population [facility] RN Care Manager Direct Dial: [phone]  Fax: [phone]

## 2024-05-05 ENCOUNTER — Inpatient Hospital Stay: Payer: Medicare HMO

## 2024-05-11 ENCOUNTER — Other Ambulatory Visit: Payer: Self-pay

## 2024-05-11 DIAGNOSIS — C9001 Multiple myeloma in remission: Secondary | ICD-10-CM

## 2024-05-13 NOTE — Progress Notes (Signed)
 HEMATOLOGY/ONCOLOGY CLINIC VISIT NOTE  Date of Service: 05/14/24   Patient Care Team: [name] Yancey LABOR, MD as PCP - General (Internal Medicine) Patel, Donika K, DO as Consulting Physician (Neurology) Charlsie Josette SAILOR, RN as VBCI Care Management  CHIEF COMPLAINTS/PURPOSE OF CONSULTATION:   Follow-up for continued evaluation and management of multiple myeloma   INTERVAL HISTORY:   Christine Garrett is a 68 y.o. female here for continued evaluation and management of her multiple myeloma. She was last seen on 02/20/2024 by Dr. Onesimo. In the interim, she denies any changes to her health.   Christine Garrett reports her energy levels are fairly stable.  She does have fatigue but she continues to complete all her daily activities on her own including doing all her chores, getting meals ready and trying to walk daily.  She does rest in between which improves her fatigue.  She has noticed her appetite has decreased mainly due to taste changes.  She tries to supplement her diet with 1-2 protein smoothies per day.  She denies nausea, vomiting or abdominal pain.  She does have occasional loose stools based on the food she eats.  She denies easy bruising or overt signs of bleeding.  Patient has shortness of breath mainly with heavy exertion.  She denies fevers, chills, night sweats, chest pain, cough, headaches, dizziness or neuropathy.  She has no other complaints.  MEDICAL HISTORY:  Past Medical History:  Diagnosis Date   Arthritis    knees   Cancer (HCC)    Carpal tunnel syndrome    CHF (congestive heart failure) (HCC)    Diabetes mellitus    type 2   GERD (gastroesophageal reflux disease)    Heart murmur    Little, No concerns per Dr  Jodine pt reported.   Hypertension    controlled using a guided approch with plasma renin activity   Multiple myeloma not having achieved remission (HCC) 01/06/2017   Neuropathy    Peripheral vascular disease    Sleep apnea    01/03/2020: per patient,  hasn't used CPAP machine in two years due to inability to breathe well with it on    SURGICAL HISTORY: Past Surgical History:  Procedure Laterality Date   BIOPSY  11/28/2016   Procedure: BIOPSY;  Surgeon: Golda Claudis PENNER, MD;  [facility]: [facility];  Service: Endoscopy;;  gastric   BIOPSY  07/02/2019   Procedure: BIOPSY;  Surgeon: Golda Claudis PENNER, MD;  [facility]: [facility];  Service: Endoscopy;;   BTL     1982   CATARACT EXTRACTION W/PHACO Left 03/10/2023   Procedure: CATARACT EXTRACTION PHACO AND INTRAOCULAR LENS PLACEMENT (IOC);  Surgeon: Harrie Agent, MD;  Location: [facility];  Service: Ophthalmology;  Laterality: Left;  CDE: 9.43   CERVICAL CONE BIOPSY     cervical lesion   COLONOSCOPY WITH PROPOFOL  N/A 07/02/2019   Procedure: COLONOSCOPY WITH PROPOFOL ;  Surgeon: Golda Claudis PENNER, MD;  [facility]: [facility];  Service: Endoscopy;  Laterality: N/A;  12:40 - pt can't come earlier due to transportation   ESOPHAGOGASTRODUODENOSCOPY (EGD) WITH PROPOFOL  N/A 11/28/2016   Procedure: ESOPHAGOGASTRODUODENOSCOPY (EGD) WITH PROPOFOL ;  Surgeon: Golda Claudis PENNER, MD;  [facility]: [facility];  Service: Endoscopy;  Laterality: N/A;  9:25   IR FLUORO GUIDE PORT INSERTION RIGHT  01/29/2017   IR US  GUIDE VASC ACCESS RIGHT  01/29/2017   lipoma removal     right shoulder 2001   LUMBAR FUSION  01/05/2020    Lumbar Two-Three Lumbar  Three-Four Lumbar Four-Five Posterior lumbar interbody fusion with decompression (N/A Spine Lumbar)   MULTIPLE EXTRACTIONS WITH ALVEOLOPLASTY Bilateral 08/24/2012   Procedure: MULTIPLE EXTRACTION WITH ALVEOLOPLASTY BIOPSY OF RIGHT AND LEFT MANDIBLE ;  Surgeon: Glendia CHRISTELLA Primrose, DDS;  Location: [facility];  Service: Oral Surgery;  Laterality: Bilateral;   POLYPECTOMY  07/02/2019   Procedure: POLYPECTOMY;  Surgeon: Golda Claudis PENNER, MD;  [facility]: [facility];  Service: Endoscopy;;   POSTERIOR LUMBAR FUSION 4 LEVEL N/A 12/26/2016   Procedure: THORACIC EIGHT TUMOR RESECTION, THORACIC  SIX- THORACIC TEN POSTERIOR SPINAL FUSION;  Surgeon: Gillie Duncans, MD;  Location: [facility];  Service: Neurosurgery;  Laterality: N/A;  THORACIC 8 TUMOR RESECTION, THORACIC 6- THORACIC 10 POSTERIOR SPINAL FUSION   ROTATOR CUFF REPAIR     right shoulder   TUBAL LIGATION      SOCIAL HISTORY: Social History   Socioeconomic History   Marital status: Single    Spouse name: Not on file   Number of children: 3   Years of education: Not on file   Highest education level: Not on file  Occupational History   Occupation: on disability   Occupation: former CNA  Tobacco Use   Smoking status: Former    Current packs/day: 0.00    Average packs/day: 0.5 packs/day for 6.0 years (3.0 ttl pk-yrs)    Types: Cigarettes    Start date: 2013    Quit date: 2019    Years since quitting: 6.9   Smokeless tobacco: Never  Vaping Use   Vaping status: Never Used  Substance and Sexual Activity   Alcohol use: Yes    Alcohol/week: 6.0 standard drinks of alcohol    Types: 6 Cans of beer per week   Drug use: No   Sexual activity: Not Currently    Birth control/protection: Post-menopausal  Other Topics Concern   Not on file  Social History Narrative   Lives with son, daughter and grandkids in a 2 story home but stays on the first floor.  Has 3 children.  On disability since ~ 2006 for back issues but did work as a LAWYER.      Right handed   Social Drivers of Health   Financial Resource Strain: Low Risk (10/30/2023)   [facility]   Overall Financial Resource Strain (CARDIA)    Difficulty of Paying Living Expenses: Not hard at all  Food Insecurity: No Food Insecurity (03/08/2024)   Hunger Vital Sign    Worried About Running Out of Food in the Last Year: Never true    Ran Out of Food in the Last Year: Never true  Transportation Needs: No Transportation Needs (03/08/2024)   PRAPARE - Administrator, Civil Service (Medical): No    Lack of Transportation (Non-Medical): No  Physical  Activity: Inactive (10/31/2023)   Received from [facility]   Exercise Vital Sign    On average, how many days per week do you engage in moderate to strenuous exercise (like a brisk walk)?: 0 days    On average, how many minutes do you engage in exercise at this level?: 0 min  Stress: Not on file  Social Connections: Socially Isolated (10/31/2023)   Received from [facility]   Social Connection and Isolation Panel    In a typical week, how many times do you talk on the phone with family, friends, or neighbors?: Three times a week    How often do you get together with friends or  relatives?: Once a week    How often do you attend church or religious services?: Never    Do you belong to any clubs or organizations such as church groups, unions, fraternal or athletic groups, or school groups?: No    How often do you attend meetings of the clubs or organizations you belong to?: Never    Are you married, widowed, divorced, separated, never married, or living with a partner?: Widowed  Intimate Partner Violence: Not At Risk (03/08/2024)   Humiliation, Afraid, Rape, and Kick questionnaire    Fear of Current or Ex-Partner: No    Emotionally Abused: No    Physically Abused: No    Sexually Abused: No    FAMILY HISTORY: Family History  Problem Relation Age of Onset   Diabetes Mother    Hypertension Mother    Heart disease Mother    Alzheimer's disease Mother    Diabetes Father    Heart disease Sister    Diabetes Sister     ALLERGIES:  is allergic to hydrocodone.  MEDICATIONS:  Current Outpatient Medications  Medication Sig Dispense Refill   amLODipine  (NORVASC ) 5 MG tablet Take 5 mg by mouth daily.     apixaban  (ELIQUIS ) 5 MG TABS tablet Take 1 tablet (5 mg total) by mouth 2 (two) times daily.     atorvastatin  (LIPITOR) 40 MG tablet Take 40 mg by mouth daily.     clopidogrel (PLAVIX) 75 MG tablet Take 75 mg by mouth.     cyanocobalamin  (VITAMIN B12) 1000 MCG tablet Take 1 tablet  (1,000 mcg total) by mouth daily. 30 tablet 3   empagliflozin (JARDIANCE) 10 MG TABS tablet Take 1 tablet by mouth daily.     ezetimibe (ZETIA) 10 MG tablet Take 1 tablet by mouth daily.     ferrous sulfate  325 (65 FE) MG EC tablet Take 1 tablet (325 mg total) by mouth daily with breakfast. 30 tablet 3   folic acid  (FOLVITE ) 1 MG tablet Take 1 tablet (1 mg total) by mouth daily. (Patient not taking: Reported on 03/08/2024) 30 tablet 2   hydrALAZINE  (APRESOLINE ) 25 MG tablet Take 25 mg by mouth 2 (two) times daily.     insulin  degludec (TRESIBA  FLEXTOUCH) 100 UNIT/ML SOPN FlexTouch Pen Inject 0.5 mLs (50 Units total) into the skin daily at 10 pm. 5 pen 2   Insulin  Pen Needle (B-D ULTRAFINE III SHORT PEN) 31G X 8 MM MISC 1 each by Does not apply route as directed. 100 each 3   lipase/protease/amylase (CREON ) 36000 UNITS CPEP capsule Take 1 capsule (36,000 Units total) by mouth 3 (three) times daily with meals. 180 capsule 2   metFORMIN  (GLUCOPHAGE ) 1000 MG tablet Take 1,000 mg by mouth 2 (two) times daily.  (Patient taking differently: Take 1,000 mg by mouth daily.)     metoprolol  succinate (TOPROL -XL) 50 MG 24 hr tablet Take 50 mg by mouth daily. Take with or immediately following a meal.     Omega-3 Fatty Acids (FISH OIL) 1000 MG CPDR Take 1,000 mg by mouth daily. Omega 3 300 mg     ondansetron  (ZOFRAN ) 8 MG tablet Take 1 tablet (8 mg total) by mouth every 8 (eight) hours as needed for nausea or vomiting. (Patient not taking: Reported on 03/08/2024) 30 tablet 0   potassium chloride  (MICRO-K ) 10 MEQ CR capsule Take 10 mEq by mouth daily.     Vitamin D , Ergocalciferol , (DRISDOL ) 50000 units CAPS capsule Take 50,000 Units by mouth every Monday.  Zoledronic  Acid (ZOMETA  IV) Inject 4 mg into the vein every 3 (three) months. Infusion     No current facility-administered medications for this visit.   Facility-Administered Medications Ordered in Other Visits  Medication Dose Route Frequency Provider  Last Rate Last Admin   acetaminophen  (TYLENOL ) tablet 650 mg  650 mg Oral Once Kale, Gautam Kishore, MD       loratadine  (CLARITIN ) tablet 10 mg  10 mg Oral Once Kale, Gautam Kishore, MD       sodium chloride  flush (NS) 0.9 % injection 10 mL  10 mL Intracatheter Once Kale, Gautam Kishore, MD       Zoledronic  Acid (ZOMETA ) IVPB 4 mg  4 mg Intravenous Once Onesimo Emaline Brink, MD        REVIEW OF SYSTEMS:    10 Point review of Systems was done is negative except as noted above.   PHYSICAL EXAMINATION:  .BP (!) 153/75 (BP Location: Left Arm, Patient Position: Sitting, Cuff Size: Normal)   Pulse 95   Temp 97.7 F (36.5 C) (Temporal)   Resp 17   Ht 5' 9 (1.753 m)   Wt 217 lb (98.4 kg)   SpO2 100%   BMI 32.05 kg/m    GENERAL:alert, in no acute distress and comfortable SKIN: no acute rashes, no significant lesions EYES: conjunctiva are pink and non-injected, sclera anicteric LUNGS: clear to auscultation b/l with normal respiratory effort HEART: regular rate & rhythm Extremity: no pedal edema PSYCH: alert & oriented x 3 with fluent speech NEURO: no focal motor/sensory deficits    LABORATORY DATA:  I have reviewed the data as listed      Latest Ref Rng & Units 05/14/2024    9:35 AM 04/02/2024    1:03 PM 02/20/2024   10:07 AM  CBC  WBC 4.0 - 10.5 K/uL 6.0  6.3  5.7   Hemoglobin 12.0 - 15.0 g/dL 88.6  89.6  89.5   Hematocrit 36.0 - 46.0 % 33.9  31.0  32.2   Platelets 150 - 400 K/uL 245  268  251     .    Latest Ref Rng & Units 05/14/2024    9:35 AM 04/02/2024    1:03 PM 02/20/2024   10:07 AM  CMP  Glucose 70 - 99 mg/dL 885  881  883   BUN 8 - 23 mg/dL 10  12  16    Creatinine 0.44 - 1.00 mg/dL 8.89  8.70  8.70   Sodium 135 - 145 mmol/L 142  141  142   Potassium 3.5 - 5.1 mmol/L 4.0  4.1  3.9   Chloride 98 - 111 mmol/L 111  112  113   CO2 22 - 32 mmol/L 20  25  23    Calcium  8.9 - 10.3 mg/dL 9.6  9.5  9.3   Total Protein 6.5 - 8.1 g/dL 7.6  7.2  7.3   Total  Bilirubin 0.0 - 1.2 mg/dL 0.6  0.3  0.3   Alkaline Phos 38 - 126 U/L 88  82  87   AST 15 - 41 U/L 41  29  23   ALT 0 - 44 U/L 43  30  27    Bone marrow biopsy 02/18/2023:    03/25/18 SPEP and SFLC:     04/21/17 Cytogenetics:    01/17/17 Cytogenetics:    RADIOGRAPHIC STUDIES: I have personally reviewed the radiological images as listed and agreed with the findings in the report.  No results found.   10/13/17  PET/CT     ASSESSMENT & PLAN:  Christine Garrett is a 68 y.o. female who presents for a follow up for continued management of multiple myeloma.     1. Multiple myeloma -IgG kappa multiple myeloma. Cytogenetics studies demonstrated normal karyotype, FISH studies are positive for 13q34 & D13S319 loss and no loss of p53. Based on initial information, patient was staged at [facility].  -Completed 6 cycles of induction with Revlimid , Velcade  and dexamethasone  --Underwent Autologous PBSCT on 07/01/17.  PLAN: -Labs from today were reviewed. WBC 6.0, hemoglobin 11.3, platelets 245, creatinine has improved to 1.10, calcium  normal, LFTs normal.  Iron  panel is pending.  Myeloma panel is pending. -Most recent myeloma panel from 04/02/2024 showed M protein is overall stable at 0.4 g/dL.  Kappa light chain did not increase from 69.3 to 86.3. -Revlimid  on hold due to cytopenias, history of DVT and recent CAD requiring PCI.  -Continue Zometa  today  FOLLOW-UP: --Labs in 6 weeks and phone visit with Dr. Onesimo in 7 weeks --12 weeks: Labs, follow up and next dose of Zometa    All of the patient's questions were answered with apparent satisfaction. The patient knows to call the clinic with any problems, questions or concerns.   I have spent a total of 30 minutes minutes of face-to-face and non-face-to-face time, preparing to see the patient, performing a medically appropriate examination, counseling and educating the patient, documenting clinical information in the electronic health record,  independently interpreting results and communicating results to the patient, and care coordination.   Johnston Police PA-C Dept of Hematology and Oncology [facility] Phone: [phone]

## 2024-05-14 ENCOUNTER — Inpatient Hospital Stay

## 2024-05-14 ENCOUNTER — Inpatient Hospital Stay: Attending: Hematology | Admitting: Physician Assistant

## 2024-05-14 VITALS — BP 153/75 | HR 95 | Temp 97.7°F | Resp 17 | Ht 69.0 in | Wt 217.0 lb

## 2024-05-14 VITALS — BP 121/44 | HR 89 | Temp 98.5°F | Resp 16

## 2024-05-14 DIAGNOSIS — C9001 Multiple myeloma in remission: Secondary | ICD-10-CM

## 2024-05-14 DIAGNOSIS — Z79899 Other long term (current) drug therapy: Secondary | ICD-10-CM | POA: Diagnosis not present

## 2024-05-14 DIAGNOSIS — C9000 Multiple myeloma not having achieved remission: Secondary | ICD-10-CM | POA: Diagnosis not present

## 2024-05-14 DIAGNOSIS — Z95828 Presence of other vascular implants and grafts: Secondary | ICD-10-CM

## 2024-05-14 LAB — CBC WITH DIFFERENTIAL (CANCER CENTER ONLY)
Abs Immature Granulocytes: 0.01 K/uL (ref 0.00–0.07)
Basophils Absolute: 0.1 K/uL (ref 0.0–0.1)
Basophils Relative: 1 %
Eosinophils Absolute: 0.1 K/uL (ref 0.0–0.5)
Eosinophils Relative: 2 %
HCT: 33.9 % — ABNORMAL LOW (ref 36.0–46.0)
Hemoglobin: 11.3 g/dL — ABNORMAL LOW (ref 12.0–15.0)
Immature Granulocytes: 0 %
Lymphocytes Relative: 30 %
Lymphs Abs: 1.8 K/uL (ref 0.7–4.0)
MCH: 33 pg (ref 26.0–34.0)
MCHC: 33.3 g/dL (ref 30.0–36.0)
MCV: 99.1 fL (ref 80.0–100.0)
Monocytes Absolute: 0.4 K/uL (ref 0.1–1.0)
Monocytes Relative: 7 %
Neutro Abs: 3.6 K/uL (ref 1.7–7.7)
Neutrophils Relative %: 60 %
Platelet Count: 245 K/uL (ref 150–400)
RBC: 3.42 MIL/uL — ABNORMAL LOW (ref 3.87–5.11)
RDW: 14.7 % (ref 11.5–15.5)
WBC Count: 6 K/uL (ref 4.0–10.5)
nRBC: 0 % (ref 0.0–0.2)

## 2024-05-14 LAB — CMP (CANCER CENTER ONLY)
ALT: 43 U/L (ref 0–44)
AST: 41 U/L (ref 15–41)
Albumin: 4.2 g/dL (ref 3.5–5.0)
Alkaline Phosphatase: 88 U/L (ref 38–126)
Anion gap: 11 (ref 5–15)
BUN: 10 mg/dL (ref 8–23)
CO2: 20 mmol/L — ABNORMAL LOW (ref 22–32)
Calcium: 9.6 mg/dL (ref 8.9–10.3)
Chloride: 111 mmol/L (ref 98–111)
Creatinine: 1.1 mg/dL — ABNORMAL HIGH (ref 0.44–1.00)
GFR, Estimated: 55 mL/min — ABNORMAL LOW (ref >60)
Glucose, Bld: 114 mg/dL — ABNORMAL HIGH (ref 70–99)
Potassium: 4 mmol/L (ref 3.5–5.1)
Sodium: 142 mmol/L (ref 135–145)
Total Bilirubin: 0.6 mg/dL (ref 0.0–1.2)
Total Protein: 7.6 g/dL (ref 6.5–8.1)

## 2024-05-14 LAB — SAMPLE TO BLOOD BANK

## 2024-05-14 LAB — IRON AND IRON BINDING CAPACITY (CC-WL,HP ONLY)
Iron: 70 ug/dL (ref 28–170)
Saturation Ratios: 20 % (ref 10.4–31.8)
TIBC: 349 ug/dL (ref 250–450)
UIBC: 279 ug/dL

## 2024-05-14 LAB — FERRITIN: Ferritin: 60 ng/mL (ref 11–307)

## 2024-05-14 MED ORDER — ACETAMINOPHEN 325 MG PO TABS: 650.0000 mg | ORAL_TABLET | Freq: Once | ORAL | Status: DC

## 2024-05-14 MED ORDER — SODIUM CHLORIDE 0.9 % IV SOLN
Freq: Once | INTRAVENOUS | Status: AC
  Administered 2024-05-14: 100 mL via INTRAVENOUS

## 2024-05-14 MED ORDER — LORATADINE 10 MG PO TABS: 10.0000 mg | ORAL_TABLET | Freq: Once | ORAL | Status: DC

## 2024-05-14 MED ORDER — SODIUM CHLORIDE 0.9% FLUSH
10.0000 mL | Freq: Once | INTRAVENOUS | Status: AC
  Administered 2024-05-14: 10 mL

## 2024-05-14 MED ORDER — ZOLEDRONIC ACID 4 MG/100ML IV SOLN
4.0000 mg | Freq: Once | INTRAVENOUS | Status: AC
  Administered 2024-05-14: 4 mg via INTRAVENOUS
  Filled 2024-05-14: qty 100

## 2024-05-14 NOTE — Patient Instructions (Signed)

## 2024-05-17 LAB — MULTIPLE MYELOMA PANEL, SERUM
Albumin SerPl Elph-Mcnc: 3.5 g/dL (ref 2.9–4.4)
Albumin/Glob SerPl: 1.1 (ref 0.7–1.7)
Alpha 1: 0.2 g/dL (ref 0.0–0.4)
Alpha2 Glob SerPl Elph-Mcnc: 0.8 g/dL (ref 0.4–1.0)
B-Globulin SerPl Elph-Mcnc: 0.9 g/dL (ref 0.7–1.3)
Gamma Glob SerPl Elph-Mcnc: 1.4 g/dL (ref 0.4–1.8)
Globulin, Total: 3.3 g/dL (ref 2.2–3.9)
IgA: 214 mg/dL (ref 87–352)
IgG (Immunoglobin G), Serum: 1519 mg/dL (ref 586–1602)
IgM (Immunoglobulin M), Srm: 39 mg/dL (ref 26–217)
M Protein SerPl Elph-Mcnc: 0.6 g/dL — ABNORMAL HIGH
Total Protein ELP: 6.8 g/dL (ref 6.0–8.5)

## 2024-05-17 LAB — KAPPA/LAMBDA LIGHT CHAINS
Kappa free light chain: 95.1 mg/L — ABNORMAL HIGH (ref 3.3–19.4)
Kappa, lambda light chain ratio: 4.13 — ABNORMAL HIGH (ref 0.26–1.65)
Lambda free light chains: 23 mg/L (ref 5.7–26.3)

## 2024-05-18 DIAGNOSIS — I10 Essential (primary) hypertension: Secondary | ICD-10-CM | POA: Diagnosis not present

## 2024-05-21 ENCOUNTER — Telehealth: Payer: Self-pay

## 2024-05-31 ENCOUNTER — Other Ambulatory Visit: Payer: Self-pay

## 2024-05-31 NOTE — Patient Outreach (Signed)
 Complex Care Management   Visit Note  05/31/2024  Name:  Christine Garrett MRN: [id number] DOB: 1956/03/04  Situation: Referral received for Complex Care Management related to Health Maintenance. I obtained verbal consent from Patient.  Visit completed with [name]  on the phone.  Needs blood pressure cuff at home, needs multiple health maintenance items addressed.    Background:   Past Medical History:  Diagnosis Date   Arthritis    knees   Cancer (HCC)    Carpal tunnel syndrome    CHF (congestive heart failure) (HCC)    Diabetes mellitus    type 2   GERD (gastroesophageal reflux disease)    Heart murmur    Little, No concerns per Dr  Jodine pt reported.   Hypertension    controlled using a guided approch with plasma renin activity   Multiple myeloma not having achieved remission (HCC) 01/06/2017   Neuropathy    Peripheral vascular disease    Sleep apnea    01/03/2020: per patient, hasn't used CPAP machine in two years due to inability to breathe well with it on    Assessment: Patient Reported Symptoms:  Cognitive Cognitive Status: Alert and oriented to person, place, and time, Insightful and able to interpret abstract concepts, Normal speech and language skills      Neurological Neurological Review of Symptoms: No symptoms reported    HEENT HEENT Symptoms Reported: No symptoms reported      Cardiovascular Cardiovascular Symptoms Reported: No symptoms reported    Respiratory Respiratory Symptoms Reported: No symptoms reported    Endocrine Is patient diabetic?: Yes Is patient checking blood sugars at home?: Yes List most recent blood sugar readings, include date and time of day: FBG 120mg /dL    Gastrointestinal Gastrointestinal Symptoms Reported: No symptoms reported      Genitourinary Genitourinary Symptoms Reported: No symptoms reported    Integumentary Integumentary Symptoms Reported: No symptoms reported Additional Integumentary Details: Checks feet  daily, PCP checks at visits.    Musculoskeletal Musculoskelatal Symptoms Reviewed: No symptoms reported        Psychosocial Psychosocial Symptoms Reported: Not assessed          05/31/2024    PHQ2-9 Depression Screening   Little interest or pleasure in doing things    Feeling down, depressed, or hopeless    PHQ-2 - Total Score    Trouble falling or staying asleep, or sleeping too much    Feeling tired or having little energy    Poor appetite or overeating     Feeling bad about yourself - or that you are a failure or have let yourself or your family down    Trouble concentrating on things, such as reading the newspaper or watching television    Moving or speaking so slowly that other people could have noticed.  Or the opposite - being so fidgety or restless that you have been moving around a lot more than usual    Thoughts that you would be better off dead, or hurting yourself in some way    PHQ2-9 Total Score    If you checked off any problems, how difficult have these problems made it for you to do your work, take care of things at home, or get along with other people    Depression Interventions/Treatment      There were no vitals filed for this visit. Pain Scale: 0-10 Pain Score: 5  Pain Location: Back Pain Orientation: Mid Pain Descriptors / Indicators: Aching  Medications Reviewed Today  Medications were not reviewed in this encounter     Recommendation:   Specialty provider follow-up Oncology 06/25/24 and 07/02/24 This RNCM contacted PCP office, staff will place referral for Colonoscopy.   Follow Up Plan:   Telephone follow-up in 1 month  Santana Stamp BSN, CCM [place]  VBCI Population Health RN Care Manager Direct Dial: [phone]  Fax: [phone]

## 2024-05-31 NOTE — Patient Instructions (Signed)
 Visit Information  Thank you for taking time to visit with me today. Please don't hesitate to contact me if I can be of assistance to you before our next scheduled appointment.  Your next care management appointment is by telephone on 06/28/24 at 2:00pm  Please call the care guide team at [phone] if you need to cancel, schedule, or reschedule an appointment.   Please call the [place]  National Suicide Prevention Lifeline: (817)355-6329 or TTY: 850-830-4628 TTY 513-532-0048) to talk to a trained counselor if you are experiencing a Mental Health or Behavioral Health Crisis or need someone to talk to.  Santana Stamp BSN, CCM [place]  VBCI Population Health RN Care Manager Direct Dial: [phone]  Fax: [phone]

## 2024-06-02 ENCOUNTER — Encounter: Payer: Self-pay | Admitting: Hematology

## 2024-06-02 ENCOUNTER — Encounter (INDEPENDENT_AMBULATORY_CARE_PROVIDER_SITE_OTHER): Payer: Self-pay | Admitting: *Deleted

## 2024-06-24 ENCOUNTER — Other Ambulatory Visit: Payer: Self-pay

## 2024-06-24 DIAGNOSIS — C9001 Multiple myeloma in remission: Secondary | ICD-10-CM

## 2024-06-25 ENCOUNTER — Inpatient Hospital Stay: Attending: Hematology

## 2024-06-25 DIAGNOSIS — Z79899 Other long term (current) drug therapy: Secondary | ICD-10-CM | POA: Diagnosis not present

## 2024-06-25 DIAGNOSIS — C9001 Multiple myeloma in remission: Secondary | ICD-10-CM | POA: Insufficient documentation

## 2024-06-25 LAB — CBC WITH DIFFERENTIAL (CANCER CENTER ONLY)
Abs Immature Granulocytes: 0.01 K/uL (ref 0.00–0.07)
Basophils Absolute: 0.1 K/uL (ref 0.0–0.1)
Basophils Relative: 1 %
Eosinophils Absolute: 0.1 K/uL (ref 0.0–0.5)
Eosinophils Relative: 2 %
HCT: 33.5 % — ABNORMAL LOW (ref 36.0–46.0)
Hemoglobin: 11.4 g/dL — ABNORMAL LOW (ref 12.0–15.0)
Immature Granulocytes: 0 %
Lymphocytes Relative: 30 %
Lymphs Abs: 2 K/uL (ref 0.7–4.0)
MCH: 33.1 pg (ref 26.0–34.0)
MCHC: 34 g/dL (ref 30.0–36.0)
MCV: 97.4 fL (ref 80.0–100.0)
Monocytes Absolute: 0.5 K/uL (ref 0.1–1.0)
Monocytes Relative: 7 %
Neutro Abs: 4 K/uL (ref 1.7–7.7)
Neutrophils Relative %: 60 %
Platelet Count: 252 K/uL (ref 150–400)
RBC: 3.44 MIL/uL — ABNORMAL LOW (ref 3.87–5.11)
RDW: 14.6 % (ref 11.5–15.5)
WBC Count: 6.7 K/uL (ref 4.0–10.5)
nRBC: 0 % (ref 0.0–0.2)

## 2024-06-25 LAB — SAMPLE TO BLOOD BANK

## 2024-06-25 LAB — CMP (CANCER CENTER ONLY)
ALT: 35 U/L (ref 0–44)
AST: 36 U/L (ref 15–41)
Albumin: 3.9 g/dL (ref 3.5–5.0)
Alkaline Phosphatase: 93 U/L (ref 38–126)
Anion gap: 10 (ref 5–15)
BUN: 9 mg/dL (ref 8–23)
CO2: 23 mmol/L (ref 22–32)
Calcium: 9.5 mg/dL (ref 8.9–10.3)
Chloride: 109 mmol/L (ref 98–111)
Creatinine: 1.1 mg/dL — ABNORMAL HIGH (ref 0.44–1.00)
GFR, Estimated: 55 mL/min — ABNORMAL LOW
Glucose, Bld: 114 mg/dL — ABNORMAL HIGH (ref 70–99)
Potassium: 4.1 mmol/L (ref 3.5–5.1)
Sodium: 141 mmol/L (ref 135–145)
Total Bilirubin: 0.3 mg/dL (ref 0.0–1.2)
Total Protein: 7.5 g/dL (ref 6.5–8.1)

## 2024-06-25 LAB — IRON AND IRON BINDING CAPACITY (CC-WL,HP ONLY)
Iron: 41 ug/dL (ref 28–170)
Saturation Ratios: 13 % (ref 10.4–31.8)
TIBC: 323 ug/dL (ref 250–450)
UIBC: 282 ug/dL

## 2024-06-25 LAB — FERRITIN: Ferritin: 74 ng/mL (ref 11–307)

## 2024-06-28 ENCOUNTER — Telehealth: Payer: Self-pay

## 2024-06-28 LAB — KAPPA/LAMBDA LIGHT CHAINS
Kappa free light chain: 97.6 mg/L — ABNORMAL HIGH (ref 3.3–19.4)
Kappa, lambda light chain ratio: 5.58 — ABNORMAL HIGH (ref 0.26–1.65)
Lambda free light chains: 17.5 mg/L (ref 5.7–26.3)

## 2024-06-30 LAB — MULTIPLE MYELOMA PANEL, SERUM
Albumin SerPl Elph-Mcnc: 3.4 g/dL (ref 2.9–4.4)
Albumin/Glob SerPl: 1 (ref 0.7–1.7)
Alpha 1: 0.2 g/dL (ref 0.0–0.4)
Alpha2 Glob SerPl Elph-Mcnc: 0.8 g/dL (ref 0.4–1.0)
B-Globulin SerPl Elph-Mcnc: 0.9 g/dL (ref 0.7–1.3)
Gamma Glob SerPl Elph-Mcnc: 1.5 g/dL (ref 0.4–1.8)
Globulin, Total: 3.5 g/dL (ref 2.2–3.9)
IgA: 209 mg/dL (ref 87–352)
IgG (Immunoglobin G), Serum: 1504 mg/dL (ref 586–1602)
IgM (Immunoglobulin M), Srm: 35 mg/dL (ref 26–217)
M Protein SerPl Elph-Mcnc: 0.6 g/dL — ABNORMAL HIGH
Total Protein ELP: 6.9 g/dL (ref 6.0–8.5)

## 2024-07-02 ENCOUNTER — Inpatient Hospital Stay (HOSPITAL_BASED_OUTPATIENT_CLINIC_OR_DEPARTMENT_OTHER): Admitting: Hematology

## 2024-07-02 ENCOUNTER — Other Ambulatory Visit (HOSPITAL_COMMUNITY): Payer: Self-pay

## 2024-07-02 DIAGNOSIS — Z95828 Presence of other vascular implants and grafts: Secondary | ICD-10-CM

## 2024-07-02 DIAGNOSIS — C9001 Multiple myeloma in remission: Secondary | ICD-10-CM

## 2024-07-02 MED ORDER — ACYCLOVIR 400 MG PO TABS
400.0000 mg | ORAL_TABLET | Freq: Two times a day (BID) | ORAL | 11 refills | Status: AC
  Filled 2024-07-02: qty 60, 30d supply, fill #0

## 2024-07-02 MED ORDER — ONDANSETRON HCL 8 MG PO TABS
8.0000 mg | ORAL_TABLET | Freq: Three times a day (TID) | ORAL | 0 refills | Status: AC | PRN
  Filled 2024-07-02: qty 30, 10d supply, fill #0

## 2024-07-02 MED ORDER — IXAZOMIB CITRATE 3 MG PO CAPS
ORAL_CAPSULE | ORAL | 2 refills | Status: AC
  Filled 2024-07-06: qty 3, 28d supply, fill #0

## 2024-07-02 MED ORDER — DEXAMETHASONE 4 MG PO TABS
4.0000 mg | ORAL_TABLET | ORAL | 1 refills | Status: AC
  Filled 2024-07-02: qty 10, 70d supply, fill #0

## 2024-07-02 NOTE — Progress Notes (Signed)
 " HEMATOLOGY ONCOLOGY PROGRESS NOTE  Date of service: 07/02/2024  Patient Care Team: [name] Yancey LABOR, MD as PCP - General (Internal Medicine) Tobie Tonita POUR, DO as Consulting Physician (Neurology) Charlsie Josette SAILOR, RN as [facility] Management Slusher, Santana LABOR, RN as Registered Nurse  CHIEF COMPLAINT/PURPOSE OF CONSULTATION: Follow-up for continued evaluation and management of multiple myeloma.  HISTORY OF PRESENTING ILLNESS: (11/21/2017) Christine Garrett is a wonderful 69 y.o. female who has been referred to us  by my colleague Dr Jeannett Limber for evaluation and management of Multiple myeloma. The pt reports that she is doing well overall.    The pt has previously been followed by my colleague Dr Jeannett Limber and completed 6 cycles of induction with Revlimid , Velcade  and dexamethasone . She had a autologous PBSCT on 07/01/17. She is on lenalidomide  maintenance currently.    The pt reports that she has had a headache for the past week, noting a constant pounding in the front of her forehead. She denies fevers and is always cold. She is having clear, nasal discharge and seasonal allergies. She notes aspirin  has alleviated her headache some.    She is continuing to take Revlimid  on 21 day cycles.   Most recent lab results (11/21/17) of CBC and CMP is as follows: all values are WNL.  MMP 11/21/17 is shows no M spike    On review of systems, pt reports headache, moving her bowels well, increasing strength, and denies mouth sores, fevers, night sweats, and any other symptoms.    SUMMARY OF ONCOLOGIC HISTORY: Oncology History  Multiple myeloma in remission (HCC)  12/26/2016 Initial Diagnosis   Multiple myeloma not having achieved remission [facility]): Originally presented with a mass resulting in compression of the spinal cord. Underwent surgery on 12/26/16 for decompression. --Initial Surgical Pathology: Plasma cell neoplasm involvement, IHC -- positive for CD138, CD79a, CD56, LCA, CD43, kappa light  chains & negative for CD20, lambda light chains; --Baseline labs, 12/30/16: tProt 7.4, Alb 2.9, Ca 9.1, Cr 0.96, AP 77; SPEP/SIFE -- M-Spike 1.0g/dL, monoclonal IgG kappa present; IgA 51, IgG 1477, IgM 17; kappa 77.6, lambda 7.4, KLR 10.35; LDH 161, beta-2  microglobulin 2.3; WBC 10.8, Hgb 11.2, Plt 298;   01/16/2017 Imaging   PET-CT: Positive pathological uptake in T9 and proximal right tibia. No other hypermetabolic disease   01/17/2017 Pathology Results   Bone Marrow Biopsy: Hypercellular bone marrow with 16% involvement by plasma cell process FISH: positive for [id number] loss, 13q34 loss, negative for del p53   01/30/2017 - 05/22/2017 Chemotherapy   RVd: Lenalidomide  25mg  PO QDay, d1-14 + bortezomib  1.3mg /m2 d1,8,15 + low-dose dexamethasone  40mg  PO QWk Q21d --Cycle #1, 01/30/17: Complete located by grade 1 nausea --Cycle #2, 02/20/17: complicated by grade 2 nausea, grade 1 fatigue --Cycle #3, 03/13/17: Complicated by progressive LE weakness --Cycle #4, 04/03/17: Bortezomib  dose reduced to 1mg /kg --Cycle #5, 04/24/17: Bortezomib  dose reduced to 1mg /kg; complicated by watery diarrhea and nausea, grade 2 --Cycle #6, 05/22/17: delayed due to complications of the previous cycle    04/15/2017 Imaging   PET-CT: No residual hypermetabolic activity in the skeletal structures.   04/21/2017 Pathology Results   BM Bx: --Mildly hypercellular bone marrow with 2% plasma cells with polyclonal appearance. --Flow Cyto: No evidence of monoclonal plasma cell or lymphocyte populations. --CytoGen: 46,XX, no clonal chromosomal abnormalities   07/01/2017 Bone Marrow Transplant   Autologous peripheral blood stem cell transplant: --Mobilization: G-CSF/MOzobil  --> [id number] CD34+ cells collected --Conditioning: Melphalan 200mg /m2, 06/30/17 --Recommended post-Tx f/u:    --Day 30,  07/30/17: done    --Day 100: 10/20/17: done, lenalidomide  maintenance recommended    --Day 180: 12/29/17 -- ?BM Bx     --Day 270:  03/23/18 -- ?BM Bx    --Day 360: 06/29/18 -- ?BM Bx  --Recommended Treatments   --Aspirin  165 mg daily.   - Bisphosphonate therapy with zoledronic  acid every 3 months.   - Monthly labs for the first year of therapy.   - Continue Bactrim, acyclovir . --Recommended Immunization schedules:    -- : HepB, PCV13, DTaP, HIB, IPV    -- : HepB, PCV13, DTaP, HIB, IPV    -- : PCV13, DTaP, HIB, IPV    -- : HepB, PPSV23    --30+ Mo: MMR    10/13/2017 Tumor Marker   tProt 6.9, Alb 4.2, Ca 9.5, Cr 0.9, AP 45; Beta-2  microglobulin 2.22;  SPEP -- no monoclonal spike; IgG 562, IgA 65, IgM 32; kappa 8.29, lambda 6.75, KLR 1.23 WBC 4.7, Hgb 12.3, Plt 283   10/13/2017 PET scan   No hypermetabolic activity in the skeletal structures.   10/13/2017 Bone Marrow Biopsy   Normocellular bone marrow with 30% cellularity, trilineage hematopoiesis, and no increase in plasma cells.   10/20/2017 Remission   [facility]: Patient has achieved sCR   10/22/2017 -  Chemotherapy   Lenalidomide  5mg  PO Qday, d1-21 Q28d maintenance      INTERVAL HISTORY: I connected with Christine Garrett on 07/02/2024 at  3:30 PM EST by telephone and verified that I am speaking with the correct person using two identifiers.   I discussed the limitations, risks, security and privacy concerns of performing an evaluation and management service by telemedicine and the availability of in-person appointments. I also discussed with the patient that there may be a patient responsible charge related to this service. The patient expressed understanding and agreed to proceed.   Other persons participating in the visit and their role in the encounter: Medical Scribe, Alan Blowers.  Patients location: Home Providers location: [facility]   Chief Complaint: continued evaluation and management of multiple myeloma.  she was last seen by me on 04/09/2024.  Today, she denies any new symtpoms. Her neuropathy is still persistent. Every now and then she  experiences some numbness up to her knee. She is not taking gapabentin anymore.   She reports her daughter died in the past month, and she has began taking care of her three grand children.   She denies infection concerns.   REVIEW OF SYSTEMS:   10 Point review of systems of done and is negative except as noted above.  MEDICAL HISTORY Past Medical History:  Diagnosis Date   Arthritis    knees   Cancer (HCC)    Carpal tunnel syndrome    CHF (congestive heart failure) (HCC)    Diabetes mellitus    type 2   GERD (gastroesophageal reflux disease)    Heart murmur    Little, No concerns per Dr  Jodine pt reported.   Hypertension    controlled using a guided approch with plasma renin activity   Multiple myeloma not having achieved remission (HCC) 01/06/2017   Neuropathy    Peripheral vascular disease    Sleep apnea    01/03/2020: per patient, hasn't used CPAP machine in two years due to inability to breathe well with it on    IMMUNIZATION HISTORY Immunization History  Administered Date(s) Administered   DTaP / IPV 12/29/2017, 03/25/2018, 06/29/2018   HIB (PRP-T) 12/29/2017, 03/25/2018, 06/29/2018   Hepatitis B, PED/ADOLESCENT 12/29/2017, 03/25/2018, 12/28/2018  Influenza Split 03/11/2019   Influenza,inj,Quad PF,6+ Mos 03/11/2019   Moderna Sars-Covid-2 Vaccination 02/10/2020, 03/10/2020   PFIZER(Purple Top)SARS-COV-2 Vaccination 07/06/2020   Pneumococcal Conjugate-13 12/29/2017, 03/25/2018, 06/29/2018   Pneumococcal Polysaccharide-23 12/28/2018   Tdap 01/27/2022    SURGICAL HISTORY Past Surgical History:  Procedure Laterality Date   BIOPSY  11/28/2016   Procedure: BIOPSY;  Surgeon: Golda Claudis PENNER, MD;  [facility];  Service: Endoscopy;;  gastric   BIOPSY  07/02/2019   Procedure: BIOPSY;  Surgeon: Golda Claudis PENNER, MD;  [facility]: [facility];  Service: Endoscopy;;   BTL     1982   CATARACT EXTRACTION W/PHACO Left 03/10/2023   Procedure: CATARACT  EXTRACTION PHACO AND INTRAOCULAR LENS PLACEMENT (IOC);  Surgeon: Harrie Agent, MD;  Location: [facility];  Service: Ophthalmology;  Laterality: Left;  CDE: 9.43   CERVICAL CONE BIOPSY     cervical lesion   COLONOSCOPY WITH PROPOFOL  N/A 07/02/2019   Procedure: COLONOSCOPY WITH PROPOFOL ;  Surgeon: Golda Claudis PENNER, MD;  [facility]: [facility];  Service: Endoscopy;  Laterality: N/A;  12:40 - pt can't come earlier due to transportation   ESOPHAGOGASTRODUODENOSCOPY (EGD) WITH PROPOFOL  N/A 11/28/2016   Procedure: ESOPHAGOGASTRODUODENOSCOPY (EGD) WITH PROPOFOL ;  Surgeon: Golda Claudis PENNER, MD;  [facility];  Service: Endoscopy;  Laterality: N/A;  9:25   IR FLUORO GUIDE PORT INSERTION RIGHT  01/29/2017   IR US  GUIDE VASC ACCESS RIGHT  01/29/2017   lipoma removal     right shoulder 2001   LUMBAR FUSION  01/05/2020    Lumbar Two-Three Lumbar Three-Four Lumbar Four-Five Posterior lumbar interbody fusion with decompression (N/A Spine Lumbar)   MULTIPLE EXTRACTIONS WITH ALVEOLOPLASTY Bilateral 08/24/2012   Procedure: MULTIPLE EXTRACTION WITH ALVEOLOPLASTY BIOPSY OF RIGHT AND LEFT MANDIBLE ;  Surgeon: Glendia CHRISTELLA Primrose, DDS;  Location: [facility] OR;  Service: Oral Surgery;  Laterality: Bilateral;   POLYPECTOMY  07/02/2019   Procedure: POLYPECTOMY;  Surgeon: Golda Claudis PENNER, MD;  [facility];  Service: Endoscopy;;   POSTERIOR LUMBAR FUSION 4 LEVEL N/A 12/26/2016   Procedure: THORACIC EIGHT TUMOR RESECTION, THORACIC SIX- THORACIC TEN POSTERIOR SPINAL FUSION;  Surgeon: Gillie Duncans, MD;  Location: [facility] OR;  Service: Neurosurgery;  Laterality: N/A;  THORACIC 8 TUMOR RESECTION, THORACIC 6- THORACIC 10 POSTERIOR SPINAL FUSION   ROTATOR CUFF REPAIR     right shoulder   TUBAL LIGATION      SOCIAL HISTORY Social History[1]  Social History   Social History Narrative   Lives with son, daughter and grandkids in a 2 story home but stays on the first floor.  Has 3 children.  On disability since ~ 2006 for  back issues but did work as a LAWYER.      Right handed    SOCIAL DRIVERS OF HEALTH SDOH Screenings   Food Insecurity: No Food Insecurity (03/08/2024)  Housing: Unknown (03/08/2024)  Transportation Needs: No Transportation Needs (03/08/2024)  Utilities: Not At Risk (03/08/2024)  Depression (PHQ2-9): Low Risk (03/08/2024)  Financial Resource Strain: Low Risk (10/30/2023)   Received from [facility]  Physical Activity: Inactive (10/31/2023)   Received from [facility]  Social Connections: Socially Isolated (10/31/2023)   Received from [facility]  Tobacco Use: Medium Risk (05/18/2024)   Received from [facility]  Health Literacy: Medium Risk (10/31/2023)   Received from [facility]     FAMILY HISTORY Family History  Problem Relation Age of Onset   Diabetes Mother    Hypertension  Mother    Heart disease Mother    Alzheimer's disease Mother    Diabetes Father    Heart disease Sister    Diabetes Sister      ALLERGIES: is allergic to hydrocodone.  MEDICATIONS  Current Outpatient Medications  Medication Sig Dispense Refill   acyclovir  (ZOVIRAX ) 400 MG tablet Take 1 tablet (400 mg total) by mouth 2 (two) times daily. 60 tablet 11   amLODipine  (NORVASC ) 5 MG tablet Take 5 mg by mouth daily.     apixaban  (ELIQUIS ) 5 MG TABS tablet Take 1 tablet (5 mg total) by mouth 2 (two) times daily.     atorvastatin  (LIPITOR) 40 MG tablet Take 40 mg by mouth daily.     clopidogrel (PLAVIX) 75 MG tablet Take 75 mg by mouth.     cyanocobalamin  (VITAMIN B12) 1000 MCG tablet Take 1 tablet (1,000 mcg total) by mouth daily. 30 tablet 3   dexamethasone  (DECADRON ) 4 MG tablet Take 1 tablet (4 mg total) by mouth once a week. 30 mins prior to each dose of Ninlaro  10 tablet 1   empagliflozin (JARDIANCE) 10 MG TABS tablet Take 1 tablet by mouth daily.     ezetimibe (ZETIA) 10 MG tablet Take 1 tablet by mouth daily.     ferrous sulfate  325 (65 FE) MG EC tablet Take 1 tablet (325 mg total) by  mouth daily with breakfast. 30 tablet 3   folic acid  (FOLVITE ) 1 MG tablet Take 1 tablet (1 mg total) by mouth daily. 30 tablet 2   hydrALAZINE  (APRESOLINE ) 25 MG tablet Take 25 mg by mouth 2 (two) times daily.     insulin  degludec (TRESIBA  FLEXTOUCH) 100 UNIT/ML SOPN FlexTouch Pen Inject 0.5 mLs (50 Units total) into the skin daily at 10 pm. 5 pen 2   Insulin  Pen Needle (B-D ULTRAFINE III SHORT PEN) 31G X 8 MM MISC 1 each by Does not apply route as directed. 100 each 3   ixazomib citrate  (NINLARO ) 3 MG capsule Take 1 capsule (3 mg) by mouth weekly, 3 weeks on, 1 week off, repeat every 4 weeks. Take on an empty stomach 1hr before or 2hr after meals. 3 capsule 2   lipase/protease/amylase (CREON ) 36000 UNITS CPEP capsule Take 1 capsule (36,000 Units total) by mouth 3 (three) times daily with meals. 180 capsule 2   metFORMIN  (GLUCOPHAGE ) 1000 MG tablet Take 1,000 mg by mouth 2 (two) times daily.  (Patient taking differently: Take 1,000 mg by mouth daily.)     metoprolol  succinate (TOPROL -XL) 50 MG 24 hr tablet Take 50 mg by mouth daily. Take with or immediately following a meal. (Patient taking differently: Take 50 mg by mouth daily. Patient reports taking 25mg  daily as ordered by Cardio on 05/18/24 visit.  Take with or immediately following a meal.)     Omega-3 Fatty Acids (FISH OIL) 1000 MG CPDR Take 1,000 mg by mouth daily. Omega 3 300 mg     ondansetron  (ZOFRAN ) 8 MG tablet Take 1 tablet (8 mg total) by mouth every 8 (eight) hours as needed for nausea or vomiting. 30 tablet 0   potassium chloride  (MICRO-K ) 10 MEQ CR capsule Take 10 mEq by mouth daily.     Vitamin D , Ergocalciferol , (DRISDOL ) 50000 units CAPS capsule Take 50,000 Units by mouth every Monday.      Zoledronic  Acid (ZOMETA  IV) Inject 4 mg into the vein every 3 (three) months. Infusion     No current facility-administered medications for this visit.  PHYSICAL EXAMINATION TELEPHONE VISIT:  GENERAL: sounds alert, in no acute distress  and comfortable PSYCH: sounds alert & oriented x 3 with fluent speech  LABORATORY DATA:   I have reviewed the data as listed     Latest Ref Rng & Units 06/25/2024    1:28 PM 05/14/2024    9:35 AM 04/02/2024    1:03 PM  CBC EXTENDED  WBC 4.0 - 10.5 K/uL 6.7  6.0  6.3   RBC 3.87 - 5.11 MIL/uL 3.44  3.42  3.15   Hemoglobin 12.0 - 15.0 g/dL 88.5  88.6  89.6   HCT 36.0 - 46.0 % 33.5  33.9  31.0   Platelets 150 - 400 K/uL 252  245  268   NEUT# 1.7 - 7.7 K/uL 4.0  3.6  3.9   Lymph# 0.7 - 4.0 K/uL 2.0  1.8  1.8        Latest Ref Rng & Units 06/25/2024    1:28 PM 05/14/2024    9:35 AM 04/02/2024    1:03 PM  CMP  Glucose 70 - 99 mg/dL 885  885  881   BUN 8 - 23 mg/dL 9  10  12    Creatinine 0.44 - 1.00 mg/dL 8.89  8.89  8.70   Sodium 135 - 145 mmol/L 141  142  141   Potassium 3.5 - 5.1 mmol/L 4.1  4.0  4.1   Chloride 98 - 111 mmol/L 109  111  112   CO2 22 - 32 mmol/L 23  20  25    Calcium  8.9 - 10.3 mg/dL 9.5  9.6  9.5   Total Protein 6.5 - 8.1 g/dL 7.5  7.6  7.2   Total Bilirubin 0.0 - 1.2 mg/dL 0.3  0.6  0.3   Alkaline Phos 38 - 126 U/L 93  88  82   AST 15 - 41 U/L 36  41  29   ALT 0 - 44 U/L 35  43  30    MULTIPLE MYELOMA & KAPPA/LAMBDA LIGHT CHAINS 06/25/2024      Bone marrow biopsy 02/18/2023:      03/25/18 SPEP and SFLC:        04/21/17 Cytogenetics:     01/17/17 Cytogenetics:      RADIOGRAPHIC STUDIES: I have personally reviewed the radiological images as listed and agreed with the findings in the report. Imaging Results  No results found.     10/13/17 PET/CT     ASSESSMENT & PLAN:  69 y.o. female with  1. Multiple myeloma Autologous PBSCT on 07/01/17. IgG kappa multiple myeloma. Cytogenetics studies demonstrated normal karyotype, FISH studies are positive for 13q34 & D13S319 loss and no loss of p53. Based on initial information, patient was staged at [facility] II.  Completed 6 cycles of induction with Revlimid , Velcade  and dexamethasone    12/09/2018 lumbar  spine xray with results revealing No acute osseous abnormality. Extensive multilevel degenerative changes are noted throughout the lumbar spine.   12/09/2018 sacrum and coccyx xray with results revealing No acute osseous abnormality. Degenerative changes are noted of both hips.   2. Grade 2 neuropathy -controlled   02/26/18 NCV with EMG which revealed The electrophysiologic findings are most consistent with a chronic, length-dependent sensorimotor axonal and demyelinating polyneuropathy affecting the right side. Overall, these findings are moderate to severe in degree electrically. Incidentally, there is a right Martin-Gruber anastomosis, a normal variant.    3. Rt LE calf veins - possible clot ?chronic.   01/29/18 VAS US  BLE  which revealed Right: Ultrasound is unable to distinguish whether obstruction in the Peroneal veins, and great saphenous vein, and superficial veins/varciosities is acute or chronic. No cystic structure found in the popliteal fossa. Left: no evidence of DVT.    4 CAD recent PCI   PLAN: - Discussed lab results on 07/02/2024 in detail with patient: -hemoglobin 11.4 -WBC and PLTs normal -CMP shows normal kidney functions, calcium  levels look stable -Ferratin 74 -Iron  sat 13%, recommended eating iron  rich foods to boost this % -M-protein 0.6, which has increased slightly since December, does not suggest disease remission -Kappa/lamdba light chains have increased to 67 -Discussed adding oral medications Ninlaro , which could cause neuropathy and Revlimid , but discussed Revlimid  could be a risk factor for blood clots. Since she has had a relatively recent hx of AMI-- will hold off on Revlimid . -We will start with the lowest dose of Ninlaro  and pair it with Acyclovir  p.o. to prevent infections like shingles and monitor worseninf neuropathy symptoms closely.   FOLLOW-UP  RTC with Dr Onesimo with labs in 6 weeks  The total time spent in the appointment was 32 minutes*  .  All of the patient's questions were answered and the patient knows to call the clinic with any problems, questions, or concerns.  Emaline Onesimo MD MS AAHIVMS [facility] Hematology/Oncology Physician [facility]  *Total Encounter Time as defined by the Centers for Medicare and Medicaid Services includes, in addition to the face-to-face time of a patient visit (documented in the note above) non-face-to-face time: obtaining and reviewing outside history, ordering and reviewing medications, tests or procedures, care coordination (communications with other health care professionals or caregivers) and documentation in the medical record.  I, Alan Blowers, acting as a neurosurgeon for Emaline Onesimo, MD.,have documented all relevant documentation on the behalf of Emaline Onesimo, MD,as directed by  Emaline Onesimo, MD while in the presence of Emaline Onesimo, MD.  I have reviewed the above documentation for accuracy and completeness, and I agree with the above.  Emaline Onesimo, MD     [1]  Social History Tobacco Use   Smoking status: Former    Current packs/day: 0.00    Average packs/day: 0.5 packs/day for 6.0 years (3.0 ttl pk-yrs)    Types: Cigarettes    Start date: 2013    Quit date: 2019    Years since quitting: 7.0   Smokeless tobacco: Never  Vaping Use   Vaping status: Never Used  Substance Use Topics   Alcohol use: Yes    Alcohol/week: 6.0 standard drinks of alcohol    Types: 6 Cans of beer per week   Drug use: No   "

## 2024-07-03 ENCOUNTER — Other Ambulatory Visit (HOSPITAL_COMMUNITY): Payer: Self-pay

## 2024-07-05 ENCOUNTER — Other Ambulatory Visit (HOSPITAL_COMMUNITY): Payer: Self-pay

## 2024-07-05 ENCOUNTER — Telehealth: Payer: Self-pay

## 2024-07-05 NOTE — Telephone Encounter (Signed)
 Oral Oncology Pharmacist Encounter  Received new prescription for Ninlaro  (ixazomib) for the treatment of IgG kappa light chain multiple myeloma in conjunction with Revlimid  (lenalidomide ), planned duration until disease progression or unacceptable tolerability.  Labs from 06/25/2024 (CMP, CrCl 79.20 mL/min, CBC, ferritin, iron  and iron  binding) assessed, patient was recommended to eat iron  rich foods to boost iron . Prescription dose and frequency evaluated for appropriateness.   Current medication list in Epic reviewed, no DDIs with Ninlaro  (ixazomib) identified.  Evaluated chart and no patient barriers to medication adherence noted.   Prescription has been e-scribed to the [facility] for benefits analysis and approval.  Oral Oncology Clinic will continue to follow for insurance authorization, copayment issues, initial counseling and start date.  Damien Henry, M.S. PharmD Candidate Class of 2026 [employer]  07/05/2024 9:06 AM Oral Oncology Clinic [phone]

## 2024-07-05 NOTE — Telephone Encounter (Signed)
 Oncology Pharmacist Encounter   Sam, patient advocate attempted to call patient to set up shipment. Patient did not answer. Had to leave a voicemail.   Ami Mally, PharmD Hematology/Oncology Clinical Pharmacist [facility]otherapy [facility] [phone]

## 2024-07-05 NOTE — Telephone Encounter (Signed)
 Oral Oncology Patient Advocate Encounter  After completing a benefits investigation, prior authorization for ixazomib citrate  (NINLARO ) 3 MG capsule  is not required at this time through [facility].  Patient's copay is $4.90.     Lucie Lamer, CPhT [place]  [facility] Oncology Pharmacy Patient Advocate Specialist [facility] [place] Phone: [phone]  Fax: [phone] [email or web address]

## 2024-07-06 ENCOUNTER — Other Ambulatory Visit: Payer: Self-pay

## 2024-07-06 NOTE — Progress Notes (Signed)
 Oral Chemotherapy Pharmacist Encounter  I spoke with patient for overview of: Ninlaro  for the treatment of multiple myeloma as monotherapy planned duration until disease progression or unacceptable toxicity. Patient will not take revlimid  due to MI and will only take dexamethasone  to reduce nausea.   Treatment goal: Supportive  Counseled patient on administration, dosing, side effects, monitoring, drug-food interactions, safe handling, storage, and disposal.  Patient will take Ninlaro  3mg  capsules, 1 capsule by mouth once weekly. Take on an empty stomach, 1 hour before or 2 hours after a meal. Take on days 1, 8, and 15 of each 28 day cycle.  Patient will take dexamethasone  4mg  tablets, 1 tablet (4mg ) by mouth once weekly on days 1, 8, 15, and 22 of each 28 day cycle.  Ninlaro  start date: 07/09/2024  Adverse effects of Ninlaro  include but are not limited to: peripheral edema, constipation, nausea, vomiting, decreased blood counts, and peripheral neuropathy.   Patient has anti-emetic on hand and knows to take it if nausea develops.    Adverse effects of Revlimid  include but are not limited to: GI upset, fatigue, rash, peripheral edema, drug fever, and muscle pains.  Reviewed with patient importance of keeping a medication schedule and plan for any missed doses. No barriers to medication adherence identified.  Medication reconciliation performed and medication/allergy list updated.  Confirmed with patient they have picked up acyclovir  prescription and have started it. Importance of supportive care medications reviewed with patient.  Insurance authorization for Ninlaro  has been obtained.  Distress thermometer not completed during telephone call as patient has been on previous lines of therapy.   Communication and Learning Assessment Primary learner: Patient Barriers to learning: No barriers Preferred language: English Learning preferences: Listening Reading  All questions answered.  Patient voiced understanding and appreciation. Medication education handout placed in mail for patient. Patient knows to call the office with questions or concerns. Oral Chemotherapy Clinic phone number provided to patient.   Esmeralda Blanford, PharmD Hematology/Oncology Clinical Pharmacist [facility] Oral Chemotherapy Navigation Clinic [phone] 07/06/2024 12:14 PM

## 2024-07-06 NOTE — Progress Notes (Signed)
 Patient counseled on Ninlaro  in telephone visit on 07/06/24.   Jennilee Demarco, PharmD Hematology/Oncology Clinical Pharmacist [facility] [phone]

## 2024-07-06 NOTE — Patient Instructions (Signed)
 [facility] [place] - A DEPT OF [facility]  Thank you for choosing [facility] to provide your oncology/hematology care and for allowing us  to participate in your care today!  As a reminder, we spoke about the following today: Ninlaro  (ixazomib) for the treatment of IgG kappa light chain multiple myeloma, planned duration until disease progression or unacceptable tolerability.   Treatment goal: Control  Medication handout has been provided.   **For oral cancer medication prescription refill requests, contact your pharmacy and they will contact our office if needed. Allow 5-7 days for refills to be completed by your specialty pharmacy.    Cancer Center General Instructions:  If you have an appointment at the [facility], please go directly to the Cancer Center and check in at the registration area.  We strive to give you quality time with your provider. You may need to reschedule your appointment if you arrive late (15 or more minutes).  Arriving late affects you and other patients whose appointments are after yours.  Also, if you miss three or more appointments without notifying the office, you may be dismissed from the clinic at the provider's discretion.      BELOW ARE SYMPTOMS THAT SHOULD BE REPORTED IMMEDIATELY: *FEVER GREATER THAN 100.4 F (38 C) OR HIGHER *CHILLS OR SWEATING *NAUSEA AND VOMITING THAT IS NOT CONTROLLED WITH YOUR NAUSEA MEDICATION *UNUSUAL SHORTNESS OF BREATH *UNUSUAL BRUISING OR BLEEDING *URINARY PROBLEMS (pain or burning when urinating, or frequent urination) *BOWEL PROBLEMS (unusual diarrhea, constipation, pain near the anus) TENDERNESS IN MOUTH AND THROAT WITH OR WITHOUT PRESENCE OF ULCERS (sore throat, sores in mouth, or a toothache) UNUSUAL RASH, SWELLING OR PAIN  UNUSUAL VAGINAL DISCHARGE OR ITCHING   Items with * indicate a potential emergency and should be followed up as soon as possible or go to the Emergency  Department if any problems should occur.  Please show the CHEMOTHERAPY ALERT CARD at check-in to the Emergency Department and triage nurse.  Should you have questions after your visit or need to cancel or reschedule your appointment, please contact [facility] [place] - A DEPT OF [facility]  [phone] and follow the prompts.  Office hours are 8:00 a.m. to 4:30 p.m. Monday - Friday. Please note that voicemails left after 4:00 p.m. may not be returned until the following business day.  We are closed weekends and major holidays. You have access to a nurse at all times for urgent questions. Please call the main number to the clinic [phone] and follow the prompts.  For any non-urgent questions, you may also contact your provider using MyChart. We now offer e-Visits for anyone 91 and older to request care online for non-urgent symptoms. For details visit [email or web address].   Also download the MyChart app! Go to the app store, search MyChart, open the app, select [place], and log in with your MyChart username and password.

## 2024-07-06 NOTE — Progress Notes (Signed)
 Specialty Pharmacy Initial Fill Coordination Note  Christine Garrett is a 69 y.o. female contacted today regarding refills of specialty medication(s) Ixazomib Citrate  (NINLARO ) .  Patient requested Delivery  on 07/08/24  to verified address [name]   [place] [address]   Medication will be filled on 07/07/24.   Patient is aware of $4.90 copayment.

## 2024-07-07 ENCOUNTER — Other Ambulatory Visit: Payer: Self-pay

## 2024-07-08 ENCOUNTER — Encounter: Payer: Self-pay | Admitting: Hematology

## 2024-07-09 ENCOUNTER — Telehealth: Payer: Self-pay

## 2024-07-09 NOTE — Patient Outreach (Signed)
 Left VM with patient with message about impending weather, possible electric and internet outages that may affect our phone appt scheduled for Monday, and to make sure patient has weather plan/medication refills/food/water .

## 2024-07-12 ENCOUNTER — Telehealth: Payer: Self-pay

## 2024-07-19 ENCOUNTER — Other Ambulatory Visit (HOSPITAL_COMMUNITY): Payer: Self-pay

## 2024-08-06 ENCOUNTER — Inpatient Hospital Stay

## 2024-08-06 ENCOUNTER — Inpatient Hospital Stay: Attending: Hematology | Admitting: Hematology
